# Patient Record
Sex: Male | Born: 1935 | Race: Black or African American | Hispanic: No | Marital: Married | Smoking: Former smoker
Health system: Southern US, Community
[De-identification: ages and names within clinical notes are randomized; demographics above are authoritative.]

## PROBLEM LIST (undated history)

## (undated) DIAGNOSIS — G250 Essential tremor: Secondary | ICD-10-CM

## (undated) DIAGNOSIS — K279 Peptic ulcer, site unspecified, unspecified as acute or chronic, without hemorrhage or perforation: Secondary | ICD-10-CM

## (undated) DIAGNOSIS — K269 Duodenal ulcer, unspecified as acute or chronic, without hemorrhage or perforation: Secondary | ICD-10-CM

## (undated) DIAGNOSIS — E1142 Type 2 diabetes mellitus with diabetic polyneuropathy: Secondary | ICD-10-CM

## (undated) DIAGNOSIS — Z860100 Personal history of colon polyps, unspecified: Secondary | ICD-10-CM

## (undated) DIAGNOSIS — G252 Other specified forms of tremor: Secondary | ICD-10-CM

## (undated) DIAGNOSIS — R202 Paresthesia of skin: Secondary | ICD-10-CM

## (undated) DIAGNOSIS — E785 Hyperlipidemia, unspecified: Secondary | ICD-10-CM

## (undated) DIAGNOSIS — K589 Irritable bowel syndrome without diarrhea: Secondary | ICD-10-CM

## (undated) DIAGNOSIS — K648 Other hemorrhoids: Secondary | ICD-10-CM

## (undated) DIAGNOSIS — I10 Essential (primary) hypertension: Secondary | ICD-10-CM

## (undated) DIAGNOSIS — G5600 Carpal tunnel syndrome, unspecified upper limb: Secondary | ICD-10-CM

## (undated) DIAGNOSIS — M47812 Spondylosis without myelopathy or radiculopathy, cervical region: Secondary | ICD-10-CM

## (undated) DIAGNOSIS — R200 Anesthesia of skin: Secondary | ICD-10-CM

## (undated) DIAGNOSIS — R413 Other amnesia: Secondary | ICD-10-CM

## (undated) HISTORY — PX: COLONOSCOPY W/ BIOPSIES AND POLYPECTOMY: SHX1376

## (undated) HISTORY — PX: ANAL FISSURE REPAIR: SHX2312

## (undated) HISTORY — DX: Essential tremor: G25.0

## (undated) HISTORY — PX: UPPER GASTROINTESTINAL ENDOSCOPY: SHX188

## (undated) HISTORY — DX: Carpal tunnel syndrome, unspecified upper limb: G56.00

## (undated) HISTORY — DX: Spondylosis without myelopathy or radiculopathy, cervical region: M47.812

## (undated) HISTORY — DX: Other hemorrhoids: K64.8

## (undated) HISTORY — DX: Peptic ulcer, site unspecified, unspecified as acute or chronic, without hemorrhage or perforation: K27.9

## (undated) HISTORY — DX: Other amnesia: R41.3

## (undated) HISTORY — DX: Type 2 diabetes mellitus with diabetic polyneuropathy: E11.42

## (undated) HISTORY — DX: Other specified forms of tremor: G25.2

## (undated) HISTORY — DX: Hyperlipidemia, unspecified: E78.5

## (undated) HISTORY — DX: Essential (primary) hypertension: I10

## (undated) HISTORY — DX: Duodenal ulcer, unspecified as acute or chronic, without hemorrhage or perforation: K26.9

## (undated) HISTORY — DX: Personal history of colon polyps, unspecified: Z86.0100

## (undated) HISTORY — PX: INGUINAL HERNIA REPAIR: SHX194

## (undated) HISTORY — PX: ANKLE SURGERY: SHX546

## (undated) HISTORY — DX: Irritable bowel syndrome, unspecified: K58.9

## (undated) HISTORY — DX: Personal history of colonic polyps: Z86.010

---

## 2001-04-17 ENCOUNTER — Encounter: Admission: RE | Admit: 2001-04-17 | Discharge: 2001-04-17 | Payer: Self-pay | Admitting: *Deleted

## 2001-04-17 ENCOUNTER — Encounter: Payer: Self-pay | Admitting: Allergy and Immunology

## 2002-04-30 ENCOUNTER — Encounter: Payer: Self-pay | Admitting: Internal Medicine

## 2003-08-15 ENCOUNTER — Encounter: Admission: RE | Admit: 2003-08-15 | Discharge: 2003-08-15 | Payer: Self-pay | Admitting: Allergy and Immunology

## 2003-08-26 ENCOUNTER — Ambulatory Visit (HOSPITAL_COMMUNITY): Admission: RE | Admit: 2003-08-26 | Discharge: 2003-08-26 | Payer: Self-pay | Admitting: Allergy and Immunology

## 2004-02-06 ENCOUNTER — Encounter: Payer: Self-pay | Admitting: Internal Medicine

## 2004-02-16 ENCOUNTER — Encounter: Payer: Self-pay | Admitting: Internal Medicine

## 2004-03-05 ENCOUNTER — Encounter: Payer: Self-pay | Admitting: Internal Medicine

## 2004-04-19 ENCOUNTER — Ambulatory Visit (HOSPITAL_COMMUNITY): Admission: AD | Admit: 2004-04-19 | Discharge: 2004-04-19 | Payer: Self-pay | Admitting: Neurosurgery

## 2004-04-29 ENCOUNTER — Ambulatory Visit (HOSPITAL_COMMUNITY): Admission: RE | Admit: 2004-04-29 | Discharge: 2004-04-29 | Payer: Self-pay | Admitting: Neurosurgery

## 2006-06-14 ENCOUNTER — Encounter: Payer: Self-pay | Admitting: Internal Medicine

## 2008-03-31 ENCOUNTER — Ambulatory Visit: Payer: Self-pay | Admitting: Internal Medicine

## 2008-03-31 DIAGNOSIS — R634 Abnormal weight loss: Secondary | ICD-10-CM | POA: Insufficient documentation

## 2008-03-31 DIAGNOSIS — Z86010 Personal history of colonic polyps: Secondary | ICD-10-CM

## 2008-03-31 DIAGNOSIS — R109 Unspecified abdominal pain: Secondary | ICD-10-CM | POA: Insufficient documentation

## 2008-04-08 ENCOUNTER — Telehealth: Payer: Self-pay | Admitting: Internal Medicine

## 2008-04-10 ENCOUNTER — Encounter: Payer: Self-pay | Admitting: Internal Medicine

## 2008-04-10 ENCOUNTER — Ambulatory Visit: Payer: Self-pay | Admitting: Internal Medicine

## 2008-04-17 ENCOUNTER — Telehealth (INDEPENDENT_AMBULATORY_CARE_PROVIDER_SITE_OTHER): Payer: Self-pay

## 2008-12-02 ENCOUNTER — Emergency Department: Payer: Self-pay | Admitting: Emergency Medicine

## 2009-04-30 ENCOUNTER — Encounter: Payer: Self-pay | Admitting: Internal Medicine

## 2009-05-07 ENCOUNTER — Encounter: Payer: Self-pay | Admitting: Internal Medicine

## 2009-05-14 ENCOUNTER — Encounter: Payer: Self-pay | Admitting: Internal Medicine

## 2009-05-22 ENCOUNTER — Encounter: Payer: Self-pay | Admitting: Internal Medicine

## 2009-06-03 ENCOUNTER — Encounter: Payer: Self-pay | Admitting: Internal Medicine

## 2009-06-05 ENCOUNTER — Ambulatory Visit: Payer: Self-pay | Admitting: Internal Medicine

## 2009-06-11 ENCOUNTER — Ambulatory Visit: Payer: Self-pay | Admitting: Internal Medicine

## 2009-06-30 ENCOUNTER — Telehealth: Payer: Self-pay | Admitting: Internal Medicine

## 2009-06-30 ENCOUNTER — Ambulatory Visit: Payer: Self-pay | Admitting: Internal Medicine

## 2009-07-01 ENCOUNTER — Encounter: Payer: Self-pay | Admitting: Internal Medicine

## 2010-02-15 ENCOUNTER — Encounter: Payer: Self-pay | Admitting: Internal Medicine

## 2010-02-15 ENCOUNTER — Telehealth: Payer: Self-pay | Admitting: Internal Medicine

## 2010-03-05 ENCOUNTER — Ambulatory Visit: Payer: Self-pay | Admitting: Internal Medicine

## 2010-05-19 ENCOUNTER — Ambulatory Visit: Payer: Self-pay | Admitting: Internal Medicine

## 2010-10-17 ENCOUNTER — Encounter: Payer: Self-pay | Admitting: Neurosurgery

## 2010-10-26 NOTE — Assessment & Plan Note (Signed)
Summary: follow up diarrhea/sheri   History of Present Illness Visit Type: Follow-up Visit Primary GI MD: Stan Head MD [facility] Primary Provider: Adrian Prince, MD Requesting Provider: na Chief Complaint: Follow up Diarrhea. History of Present Illness:   Patient is here for follow up from his diarrhea states that he has some belching now but the diarrhea is better.  Two bowel movements a day. Usually formed. Has difficulty sorting out gas from stool. Still using Gas-Ex and Imodium. Has been able to travel.    GI Review of Systems    Reports belching.      Denies abdominal pain, acid reflux, bloating, chest pain, dysphagia with liquids, dysphagia with solids, heartburn, loss of appetite, nausea, vomiting, vomiting blood, weight loss, and  weight gain.        Denies anal fissure, black tarry stools, change in bowel habit, constipation, diarrhea, diverticulosis, fecal incontinence, heme positive stool, hemorrhoids, irritable bowel syndrome, jaundice, light color stool, liver problems, rectal bleeding, and  rectal pain.    Current Medications (verified): 1)  Pravachol 20 Mg  Tabs (Pravastatin Sodium) .... One Tablet By Mouth Once Daily 2)  Nasonex 50 Mcg/act  Susp (Mometasone Furoate) .... One Spray in Each Nostril Once Daily 3)  Aspirin 81 Mg Tbec (Aspirin) .... Take One By Mouth Once Daily 4)  Diovan 160 Mg Tabs (Valsartan) .... Take 1/2 Tablet By Mouth Once Daily 5)  Lactaid 3000 Unit Tabs (Lactase) .... Take One Tablet With Each Meal 6)  Imodium A-D 2 Mg Tabs (Loperamide Hcl) .Marland Kitchen.. 1-2/day As Needed 7)  Gas-X Extra Strength 125 Mg Caps (Simethicone) .Marland Kitchen.. 1 By Mouth As Needed 8)  Creon 12000 Unit Cpep (Pancrelipase (Lip-Prot-Amyl)) .... Take 1 Capsule By Mouth With Meals  Allergies (verified): 1)  ! Accupril  Past History:  Past Medical History: Reviewed history from 06/05/2009 and no changes required. Asthma/COPD Diabetes Type II Hypertension Hyperlipidemia Carpal Tunnel  Syndrome Cervical Spine DJD/neuropathy Colon polyps, ? type ?2005 Vitamin D deficiency Essential tremor Ankle Fracture  Past Surgical History: Anal Fissure surgery  Family History: Reviewed history from 03/31/2008 and no changes required. No FH of Colon Cancer:   Social History: Reviewed history from 03/05/2010 and no changes required. Occupation: Retired Patient is a former smoker. 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   Vital Signs:  Patient profile:   77 year old male Height:      66 inches Weight:      157.4 pounds BMI:     25.50 Pulse rate:   76 / minute Pulse rhythm:   regular BP sitting:   120 / 64  (left arm) Cuff size:   regular  Vitals Entered By: Harlow Mares CMA Duncan Dull) (May 19, 2010 1:23 PM)  Physical Exam  General:  elderly NAD   Impression & Recommendations:  Problem # 1:  IRRITABLE BOWEL SYNDROME (ICD-564.1) Assessment Improved He has had a chronic intermittent problem with this going back many years. At least to 2005 with unrevealing colonoscopy and EGD with respect to cause. it sounds like IBS.  he is better on Creon 12000 with meals but still has gas and stools that are indistinguishable. will try Creon 240000 with meals. He will call back with results. Keep antibiotic treatment for possible small bowel bacterial overgrowth in mind pending this response.  important to note that [place] of life is much better.  Problem # 2:  WEIGHT LOSS (YNW-295.62) Assessment: Deteriorated Down 4-5# since June. Feels well, bloodwork all ok at Dr. Rinaldo Cloud per patient. Will monitor and reassess after he clls back.  Patient Instructions: 1)  Please pick up your medications at your pharmacy. 2)  Your Creon 12000 was changed  to Creon 24000, take 1 with  meals.  3)  You may take 2 Creon 12000 before starting the Creon 24000. 4)  Please call us back in mid September with an update as to whther this has helped enough. also weigh yourself weekly and let us know those weights when you call. 5)  Copy sent to : Adrian Prince, MD 6)  The medication list was reviewed and reconciled.  All changed / newly prescribed medications were explained.  A complete medication list was provided to the patient / caregiver. Prescriptions: CREON 24000 UNIT CPEP (PANCRELIPASE (LIP-PROT-AMYL)) 1 by mouth with meals  #90 x 5   Entered and Authorized by:   Iva Boop MD, [facility]   Signed by:   Iva Boop MD, FACG on 05/19/2010   Method used:   Electronically to        [facility] (retail)       [address]       [place], [place]  [address]       Ph: [phone]       Fax: [phone]   RxID:   [id number]

## 2010-10-26 NOTE — Assessment & Plan Note (Signed)
Summary: diarrhea/Aaron Hendrix   History of Present Illness Visit Type: Follow-up Visit Primary GI MD: Stan Head MD [facility] Primary Provider: Adrian Prince, MD Requesting Provider: Corlis Leak Chief Complaint: diarrhea on and off 3 BMs per day History of Present Illness:   Chronic intermittent diarrhea problems have persisted. Imodium has been used with limited benefit. serum  seotonin and TSH recently drawn at PCP denies flushing In AM will have a firm stool then later it loosens up and then will stop with defecation after mid afternoon. Can be urgent and with incontinence. He has to carry extra clothes when he travels. Wife provides some hx.   GI Review of Systems    Reports abdominal pain.     Location of  Abdominal pain: upper abdomen.    Denies acid reflux, belching, bloating, chest pain, dysphagia with liquids, dysphagia with solids, heartburn, loss of appetite, nausea, vomiting, vomiting blood, weight loss, and  weight gain.      Reports change in bowel habits, diarrhea, and  fecal incontinence.     Denies anal fissure, black tarry stools, constipation, diverticulosis, heme positive stool, hemorrhoids, irritable bowel syndrome, jaundice, light color stool, liver problems, rectal bleeding, and  rectal pain.   EGD  Procedure date:  06/11/2009  Findings:      1) Severe gastritis in the total stomach, biopsied GASTRITIS - H. pylori 2) Duodenitis in the bulb/descending duodenum, biopsied DUODENITIS  3) Otherwise normal examination   Colonoscopy  Procedure date:  04/10/2008  Findings:      1) FIVE POLYPS REMOVED, ALL SMALL AND BENIGN-APPEARING 2) INTERNAL HEMORRHOIDS 3) OTHERWISE NORMAL COLONOSCOPY AND TERMINAL ILEUM EXAM. RANDOM COLON BIOPSIES TAKEN. 4) GOOD PREP    4.  COLON, CECAL AND DESCENDING POLYPS:  TUBULAR ADENOMAS.  NO HIGH GRADE DYSPLASIA OR MALIGNANCY IDENTIFIED.   5.  COLON, RANDOM BIOPSIES:  BENIGN COLONIC MUCOSA.  NO ACTIVE INFLAMMATION, MICROSCOPIC  COLITIS OR GRANULOMAS.   Comments:      Repeat colonoscopy in 3 years.     EGD  Procedure date:  04/10/2008  Findings:      1) 5 MM DUODENAL NODULE 2) GASTRIC MUCOSAL CHANGES CONSISTENT WITH GASTRITIS 3) OTHERWISE NORMAL EGD   1.  DUODENUM:  BENIGN SMALL BOWEL MUCOSA.  NO ACTIVE INFLAMMATION OR VILLOUS ATROPHY IDENTIFIED.   2.  STOMACH, ANTRUM:   MINIMAL CHRONIC GASTRITIS.  NO HELICOBACTER PYLORI, DYSPLASIA OR EVIDENCE OF MALIGNANCY IDENTIFIED.   3.  STOMACH, BODY AND FUNDUS:  MINIMAL CHRONIC GASTRITIS.  NO HELICOBACTER PYLORI, DYSPLASIA OR EVIDENCE OF MALIGNANCY IDENTIFIED.  Procedures Next Due Date:    Colonoscopy: 04/2011   Current Medications (verified): 1)  Pravachol 20 Mg  Tabs (Pravastatin Sodium) .... One Tablet By Mouth Once Daily 2)  Nasonex 50 Mcg/act  Susp (Mometasone Furoate) .... One Spray in Each Nostril Once Daily 3)  Aspirin 81 Mg Tbec (Aspirin) .... Take One By Mouth Once Daily 4)  Vitamin D [address] Unit  Caps (Ergocalciferol) .... One Tablet By Mouth Per Week 5)  Flora-Q   Caps (Misc Intestinal Flora Regulat) .... One Tablet By Mouth Once Daily 6)  Diovan 160 Mg Tabs (Valsartan) .... Take 1/2 Tablet By Mouth Once Daily 7)  Lactaid 3000 Unit Tabs (Lactase) .... Take One Tablet With Each Meal 8)  Imodium A-D 2 Mg Tabs (Loperamide Hcl) .Marland Kitchen.. 1-2/day 9)  Gas-X Extra Strength 125 Mg Caps (Simethicone) .Marland Kitchen.. 1 By Mouth As Needed  Allergies (verified): 1)  ! Accupril  Past History:  Past Medical History:  Reviewed history from 06/05/2009 and no changes required. Asthma/COPD Diabetes Type II Hypertension Hyperlipidemia Carpal Tunnel Syndrome Cervical Spine DJD/neuropathy Colon polyps, ? type ?2005 Vitamin D deficiency Essential tremor Ankle Fracture  Past Surgical History: Reviewed history from 03/31/2008 and no changes required. Anal Fissure  Family History: Reviewed history from 03/31/2008 and no changes required. No FH of Colon  Cancer:  Social History: Reviewed history from 06/05/2009 and no changes required. Occupation: Retired Patient is a former smoker. 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   Vital Signs:  Patient profile:   77 year old male Height:      66 inches Weight:      162 pounds BMI:     26.24 Pulse rate:   80 / minute Pulse rhythm:   regular BP sitting:   148 / 70  (left arm)  Vitals Entered By: Milford Cage NCMA (March 05, 2010 9:40 AM)  Physical Exam  General:  elderly NAD Eyes:  anicteric Lungs:  diffusely decreased breath sounds Heart:  Regular rate and rhythm; no murmurs, rubs,  or bruits. Abdomen:  soft and non-tender, no HSM or masses BS+ Neurologic:  a&o x 3   Impression & Recommendations:  Problem # 1:  IRRITABLE BOWEL SYNDROME (ICD-564.1) Assessment Unchanged He has had a chronic intermittent problem with this going back many years. At least to 2005 with unrevealing colonoscopy and EGD with respect to cause. it sounds like IBS. At times he has told me that Imodium was working. Will try Creon 12 with meals and if helpful would continue. If not then consider a round of  antibiotics for possible small bowel bacterial overgrowth they are going to the to beach in early July and there are major quality of  life issues with the diarrhea and urge incontinence hopefully will have some improvement by then.  Problem # 2:  WEIGHT LOSS (ICD-783.21) Assessment: Improved stable  Patient Instructions: 1)  Please take Creon as directed below. 2)  We will call you in 1 week to follow up. 3)  Copy sent to : Adrian Prince, MD 4)  The medication list was reviewed and reconciled.  All changed / newly prescribed medications were explained.  A complete medication list was provided to  the patient / caregiver.  Appended Document: diarrhea/Aaron Hendrix I spoke with the patient this am.  He says he is doing good on Creon.  Diarrhea has improved   Appended Document: diarrhea/Aaron Hendrix Make sure he has an Rx for this as written #90 with refills for 6 mos total, he may want mail order? If Creon 120000 not formulary then use Zen-Pep aith closest lipase value to 12,000 i would like him to arrange an REV in 2 mos  Appended Document: diarrhea/Aaron Hendrix REV scheduled with patient for 05/19/10 1:45.  Patient  wants rx to go to local rx   Clinical Lists Changes  Medications: Rx of CREON 12000 UNIT CPEP (PANCRELIPASE (LIP-PROT-AMYL)) take 1 capsule by mouth with meals;  #90 x 6;  Signed;  Entered by: Darcey Nora RN, CGRN;  Authorized by: Iva Boop MD, Clementeen Graham;  Method used: Electronically to [facility] [address] [place], [place], [place]  [address], Ph: [phone], Fax: [phone]    Prescriptions: CREON 12000 UNIT CPEP (PANCRELIPASE (LIP-PROT-AMYL)) take 1 capsule by  mouth with meals  #90 x 6   Entered by:   Darcey Nora RN, CGRN   Authorized by:   Iva Boop MD, [facility]   Signed by:   Darcey Nora RN, CGRN on 03/16/2010   Method used:   Electronically to        [facility] (retail)       [address]       [place], [place]  [address]       Ph: [phone]       Fax: [phone]   RxID:   [id number]

## 2010-10-26 NOTE — Letter (Signed)
Summary: [facility]  [facility]   Imported By: Sherian Rein 03/10/2010 13:50:40  _____________________________________________________________________  External Attachment:    Type:   Image     Comment:   External Document

## 2010-10-26 NOTE — Progress Notes (Signed)
Summary: sooner appt  Phone Note From Other Clinic Call back at [phone]   Caller: referral Coordinator Malachi Bonds Call For: Leone Payor Reason for Call: Schedule Patient Appt Summary of Call: Dr Adrian Prince would like this patient seen before first available 7-7 for continued diarrhea, states that it's not an emergency but the patient is scheduled to leave for vacation that week. Initial call taken by: Tawni Levy,  Feb 15, 2010 11:09 AM  Follow-up for Phone Call        patient scheduled for 03/05/10 9:30 Darcey Nora RN, [facility]  Feb 15, 2010 11:22 AM Follow-up by: Darcey Nora RN, CGRN,  Feb 15, 2010 11:22 AM

## 2010-12-31 LAB — GLUCOSE, CAPILLARY
Glucose-Capillary: 87 mg/dL (ref 70–99)
Glucose-Capillary: 89 mg/dL (ref 70–99)

## 2011-05-16 ENCOUNTER — Encounter: Payer: Self-pay | Admitting: Internal Medicine

## 2011-05-24 ENCOUNTER — Ambulatory Visit (INDEPENDENT_AMBULATORY_CARE_PROVIDER_SITE_OTHER): Payer: Medicare Other | Admitting: Internal Medicine

## 2011-05-24 ENCOUNTER — Encounter: Payer: Self-pay | Admitting: Internal Medicine

## 2011-05-24 VITALS — BP 132/64 | HR 60 | Ht 65.0 in | Wt 162.0 lb

## 2011-05-24 DIAGNOSIS — K589 Irritable bowel syndrome without diarrhea: Secondary | ICD-10-CM

## 2011-05-24 DIAGNOSIS — I10 Essential (primary) hypertension: Secondary | ICD-10-CM

## 2011-05-24 DIAGNOSIS — E119 Type 2 diabetes mellitus without complications: Secondary | ICD-10-CM

## 2011-05-24 MED ORDER — LOPERAMIDE HCL 2 MG PO TABS: ORAL_TABLET | ORAL | Status: DC

## 2011-05-24 MED ORDER — PANCRELIPASE (LIP-PROT-AMYL) 24000-76000 UNITS PO CPEP: 1.0000 | ORAL_CAPSULE | Freq: Three times a day (TID) | ORAL | Status: DC

## 2011-05-24 NOTE — Assessment & Plan Note (Signed)
He stopped taking his pancreatic enzyme supplement with every meal was taking it about once a day. When I saw him a year ago he was doing as well as he ever had using this 3 times a day with meals. I explained the need to take it with every meal and want him to this and call me back in a week with an update. If that's not working then will think about other possible therapies

## 2011-05-24 NOTE — Progress Notes (Signed)
  Subjective:    [name], male    DOB: 11/04/1929, 77 y.o.   MRN: [id number]  HPI he has chronic diarrhea that is thought to be irritable bowel syndrome. He had improved on Creon supplementation. He was seen a year ago and things were good but that only lasted for a couple months or so. He is describing 5-6 bowel movements a day between the morning and mid afternoon and then things seemed to taper off. He is unable to get far from the bathroom because of that. There is not much room to have a stool, and he has had incontinence. His weight has been stable over time. He says if he takes a half Imodium he'll tend to get constipated, he is using a half an Imodium tablet 3-4 times a week with some help but he is still good enough about leaving the house much with that and is concerned that if he takes that every day he might end up constipated. He has a pending trip to [place], possibly for a wedding of a relative but is very concerned about being able to travel with these problems.    Review of Systems As above    Objective:   Physical Exam Elderly BM NAD       Assessment & Plan:

## 2011-05-24 NOTE — Patient Instructions (Addendum)
Take Creon three times a day with a meal. Call us back with an update and symptoms in 1 week.

## 2011-05-25 ENCOUNTER — Encounter: Payer: Self-pay | Admitting: Internal Medicine

## 2011-05-25 DIAGNOSIS — E119 Type 2 diabetes mellitus without complications: Secondary | ICD-10-CM | POA: Insufficient documentation

## 2011-05-25 DIAGNOSIS — I10 Essential (primary) hypertension: Secondary | ICD-10-CM | POA: Insufficient documentation

## 2011-05-31 ENCOUNTER — Telehealth: Payer: Self-pay | Admitting: Internal Medicine

## 2011-05-31 NOTE — Telephone Encounter (Signed)
Aaron Hendrix has relieved all symptoms.  He is asked to continue.

## 2012-04-25 ENCOUNTER — Inpatient Hospital Stay: Payer: Self-pay | Admitting: Surgery

## 2012-04-25 LAB — COMPREHENSIVE METABOLIC PANEL
Alkaline Phosphatase: 73 U/L (ref 50–136)
Anion Gap: 11 (ref 7–16)
BUN: 8 mg/dL (ref 7–18)
Calcium, Total: 8.9 mg/dL (ref 8.5–10.1)
Chloride: 98 mmol/L (ref 98–107)
Co2: 27 mmol/L (ref 21–32)
Creatinine: 0.86 mg/dL (ref 0.60–1.30)
EGFR (African American): >60
EGFR (Non-African Amer.): >60
Potassium: 4.4 mmol/L (ref 3.5–5.1)
SGOT(AST): 27 U/L (ref 15–37)
SGPT (ALT): 12 U/L
Total Protein: 6.3 g/dL — ABNORMAL LOW (ref 6.4–8.2)

## 2012-04-25 LAB — CBC
HCT: 38.9 % — ABNORMAL LOW (ref 40.0–52.0)
HGB: 12.8 g/dL — ABNORMAL LOW (ref 13.0–18.0)
MCHC: 33 g/dL (ref 32.0–36.0)
MCV: 103 fL — ABNORMAL HIGH (ref 80–100)
RDW: 12.3 % (ref 11.5–14.5)
WBC: 11.1 10*3/uL — ABNORMAL HIGH (ref 3.8–10.6)

## 2012-04-25 LAB — URINALYSIS, COMPLETE
Bilirubin,UR: NEGATIVE
Blood: NEGATIVE
Leukocyte Esterase: NEGATIVE
Ph: 5 (ref 4.5–8.0)
Protein: NEGATIVE
Squamous Epithelial: NONE SEEN

## 2012-04-25 LAB — PROTIME-INR: INR: 0.9

## 2012-04-25 LAB — CK TOTAL AND CKMB (NOT AT ARMC): CK-MB: 1.9 ng/mL (ref 0.5–3.6)

## 2012-04-25 LAB — MAGNESIUM: Magnesium: 1.3 mg/dL — ABNORMAL LOW

## 2012-04-25 LAB — APTT: Activated PTT: 26.7 secs (ref 23.6–35.9)

## 2012-04-26 LAB — OCCULT BLOOD X 1 CARD TO LAB, STOOL: Occult Blood, Feces: POSITIVE

## 2012-04-27 LAB — CLOSTRIDIUM DIFFICILE BY PCR

## 2012-04-28 LAB — CBC WITH DIFFERENTIAL/PLATELET
Eosinophil #: 0.6 10*3/uL (ref 0.0–0.7)
HCT: 38.4 % — ABNORMAL LOW (ref 40.0–52.0)
MCH: 34.7 pg — ABNORMAL HIGH (ref 26.0–34.0)
MCHC: 33.2 g/dL (ref 32.0–36.0)
MCV: 104 fL — ABNORMAL HIGH (ref 80–100)
Monocyte #: 1.3 x10 3/mm — ABNORMAL HIGH (ref 0.2–1.0)
Neutrophil #: 6.3 10*3/uL (ref 1.4–6.5)
Platelet: 335 10*3/uL (ref 150–440)
RBC: 3.68 10*6/uL — ABNORMAL LOW (ref 4.40–5.90)
RDW: 12.6 % (ref 11.5–14.5)

## 2012-06-18 ENCOUNTER — Encounter: Payer: Self-pay | Admitting: Internal Medicine

## 2012-07-04 ENCOUNTER — Ambulatory Visit: Payer: Self-pay | Admitting: Surgery

## 2012-08-09 ENCOUNTER — Encounter: Payer: Self-pay | Admitting: *Deleted

## 2012-08-09 ENCOUNTER — Telehealth: Payer: Self-pay | Admitting: Internal Medicine

## 2012-08-09 NOTE — Telephone Encounter (Signed)
Pt having abdominal pain. Requests pt be seen sooner than 1st available. Pt scheduled to see Willette Cluster NP tomorrow at 1:30pm. Malachi Bonds faxed records and will notify pt of appt date and time.

## 2012-08-10 ENCOUNTER — Ambulatory Visit (INDEPENDENT_AMBULATORY_CARE_PROVIDER_SITE_OTHER): Payer: Medicare Other | Admitting: Nurse Practitioner

## 2012-08-10 ENCOUNTER — Encounter: Payer: Self-pay | Admitting: Nurse Practitioner

## 2012-08-10 VITALS — BP 138/60 | HR 78 | Ht 65.0 in | Wt 149.4 lb

## 2012-08-10 DIAGNOSIS — K269 Duodenal ulcer, unspecified as acute or chronic, without hemorrhage or perforation: Secondary | ICD-10-CM | POA: Insufficient documentation

## 2012-08-10 DIAGNOSIS — R1010 Upper abdominal pain, unspecified: Secondary | ICD-10-CM

## 2012-08-10 DIAGNOSIS — R109 Unspecified abdominal pain: Secondary | ICD-10-CM

## 2012-08-10 DIAGNOSIS — R110 Nausea: Secondary | ICD-10-CM

## 2012-08-10 HISTORY — DX: Duodenal ulcer, unspecified as acute or chronic, without hemorrhage or perforation: K26.9

## 2012-08-10 MED ORDER — PROMETHAZINE HCL 12.5 MG PO TABS: 12.5000 mg | ORAL_TABLET | Freq: Four times a day (QID) | ORAL | Status: DC | PRN

## 2012-08-10 NOTE — Progress Notes (Signed)
08/10/2012 Aaron Hendrix [id number] 04/21/1930   History of Present Illness:  Patient is a 78 year old male known to Dr. Leone Payor for history of diarrhea predominant irritable bowel syndrome. He was last seen August 2012. Patient is referred here for evaluation of an abnormal CT scan showing severe duodenitis.  Patient was hospitalized at [facility] late July with a penetrating ulcer involving the distal antrum and duodenum. Patient had been taking Ibuprofen. I reviewed several pages of records faxed from [place]. Patient was seen by surgeon  Dr. Renda Rolls. He was treated inpatient with IVF and IV PPI. Post hospital he followed up with surgeon he did an outpatient endoscopy 07/04/12 to make sure ulcer had healed. EGD revealed minimal duodenal deformity in the region of the bulb but no ulcer. Extent of the examination was to the third portion of the duodenum. Gastritis was found, gastric biopsies compatible with superficial vascular congestion, and H. Pylori. Patient was treated with, and completed course of Biaxin and amoxicillin. No NSAID use.   Patient saw PCP two days ago with a one week history of nausea, upper abdominal pain and anorexia. He had black stools yesterday but has been taking bismuth. The pain is slightly lower than when hospitalized with ulcer. CBC yesterday pertinent for white count of 14.8. CT scan of the abdomen and pelvis without contrast yesterday shows market wall thickening with luminal constriction of the mid duodenum . Findings associated with obstructive changes of the stomach and proximal duodenum. Other findings included focal dilation of the right distal ureter without proximal obstruction and a distended fluid-filled cecum of uncertain etiology.  Current Medications, Allergies, Past Medical History, Past Surgical History, Family History and Social History were reviewed in [employer] record.   Physical Exam: General: Well developed ,  white male in no acute distress Head: Normocephalic and atraumatic Eyes:  sclerae anicteric, conjunctiva pink  Ears: Normal auditory acuity Lungs: Clear throughout to auscultation Heart: Regular rate and rhythm Abdomen: Soft, non distended, very mild RUQ tenderness. . No masses, no hepatomegaly. Normal bowel sounds Rectal: light brown, heme negative stool Musculoskeletal: Symmetrical with no gross deformities  Extremities: No edema  Neurological: Alert oriented x 4, grossly nonfocal Psychological:  Alert and cooperative. Normal mood and affect  Assessment and Recommendations:  One week history of nausea, vomiting, and upper abdominal pain in setting of marked wall thickening of mid duodenum with partial obstruction of lumen seen on CTscan.. Interestingly, patient was hospitalized in late July with a penetrating ulcer involving antrum / pylorus. EGD in October by Surgery noted ulcer had healed and remainder of duodenal (to third portion) was normal. This current lesion is certainly distal to what he had in July, etiology is unclear. For further evaluation patient will need repeat EGD, or enteroscopy. The benefits, risks, and potential complications of EGD with possible biopsies were discussed with the patient and he agrees to proceed.

## 2012-08-10 NOTE — Patient Instructions (Addendum)
We sent prescription for Phenergan for nausea to [facility] in [place].  You have been scheduled for an endoscopy with propofol. Please follow written instructions given to you at your visit today. If you use inhalers (even only as needed) or a CPAP machine, please bring them with you on the day of your procedure.

## 2012-08-13 ENCOUNTER — Ambulatory Visit (AMBULATORY_SURGERY_CENTER): Payer: Medicare Other | Admitting: Internal Medicine

## 2012-08-13 ENCOUNTER — Other Ambulatory Visit: Payer: Medicare Other

## 2012-08-13 ENCOUNTER — Encounter: Payer: Self-pay | Admitting: Internal Medicine

## 2012-08-13 VITALS — BP 136/71 | HR 77 | Temp 98.2°F | Resp 22 | Ht 65.0 in | Wt 149.0 lb

## 2012-08-13 DIAGNOSIS — K269 Duodenal ulcer, unspecified as acute or chronic, without hemorrhage or perforation: Secondary | ICD-10-CM

## 2012-08-13 DIAGNOSIS — R110 Nausea: Secondary | ICD-10-CM

## 2012-08-13 DIAGNOSIS — R109 Unspecified abdominal pain: Secondary | ICD-10-CM

## 2012-08-13 DIAGNOSIS — R1010 Upper abdominal pain, unspecified: Secondary | ICD-10-CM

## 2012-08-13 DIAGNOSIS — K2980 Duodenitis without bleeding: Secondary | ICD-10-CM

## 2012-08-13 DIAGNOSIS — K2970 Gastritis, unspecified, without bleeding: Secondary | ICD-10-CM

## 2012-08-13 DIAGNOSIS — K2990 Gastroduodenitis, unspecified, without bleeding: Secondary | ICD-10-CM

## 2012-08-13 MED ORDER — SODIUM CHLORIDE 0.9 % IV SOLN: 500.0000 mL | INTRAVENOUS | Status: DC

## 2012-08-13 MED ORDER — DEXLANSOPRAZOLE 60 MG PO CPDR: DELAYED_RELEASE_CAPSULE | ORAL | Status: DC

## 2012-08-13 NOTE — Progress Notes (Signed)
1610 a/ox3/ pleased with MAC, report to [employer]

## 2012-08-13 NOTE — Progress Notes (Signed)
Patient did not experience any of the following events: a burn prior to discharge; a fall within the facility; wrong site/side/patient/procedure/implant event; or a hospital transfer or hospital admission upon discharge from the facility. (G8907) Patient did not have preoperative order for IV antibiotic SSI prophylaxis. (G8918)  

## 2012-08-13 NOTE — Patient Instructions (Addendum)
There is an ulcer in the duodenum. You also have inflammation in the stomach - gastritis (had before) and inflammation in the esophagus (esophagitis) which is probably from reflux.  I took multiple biopsies though I think this is probably all benign and not cancer.  I am providing samples of a drug called Dexilant to be taken every AM 30 minutes before breakfast. This should heal the ulcer. I also ordered a blood test today called a gastrin level which you will have today.  Avoid raw vegetables and high fiber (low-residue diet please). See handout.  I will call later this week with results and plans.  Thank you for choosing me and [facility].  Iva Boop, MD, [facility]  Resume current medications. See handouts on gastritis,& esophagitis. YOU HAD AN ENDOSCOPIC PROCEDURE TODAY AT THE [facility]: Refer to the procedure report that was given to you for any specific questions about what was found during the examination.  If the procedure report does not answer your questions, please call your gastroenterologist to clarify.  If you requested that your care partner not be given the details of your procedure findings, then the procedure report has been included in a sealed envelope for you to review at your convenience later.  YOU SHOULD EXPECT: Some feelings of bloating in the abdomen. Passage of more gas than usual.  Walking can help get rid of the air that was put into your GI tract during the procedure and reduce the bloating. If you had a lower endoscopy (such as a colonoscopy or flexible sigmoidoscopy) you may notice spotting of blood in your stool or on the toilet paper. If you underwent a bowel prep for your procedure, then you may not have a normal bowel movement for a few days.  DIET: Your first meal following the procedure should be a light meal and then it is ok to progress to your normal diet.  A half-sandwich or bowl of soup is an example of a good first meal.   Heavy or fried foods are harder to digest and may make you feel nauseous or bloated.  Likewise meals heavy in dairy and vegetables can cause extra gas to form and this can also increase the bloating.  Drink plenty of fluids but you should avoid alcoholic beverages for 24 hours.  ACTIVITY: Your care partner should take you home directly after the procedure.  You should plan to take it easy, moving slowly for the rest of the day.  You can resume normal activity the day after the procedure however you should NOT DRIVE or use heavy machinery for 24 hours (because of the sedation medicines used during the test).    SYMPTOMS TO REPORT IMMEDIATELY: A gastroenterologist can be reached at any hour.  During normal business hours, 8:30 AM to 5:00 PM Monday through Friday, call [phone].  After hours and on weekends, please call the GI answering service at [phone] who will take a message and have the physician on call contact you.   Following upper endoscopy (EGD)  Vomiting of blood or coffee ground material  New chest pain or pain under the shoulder blades  Painful or persistently difficult swallowing  New shortness of breath  Fever of 100F or higher  Black, tarry-looking stools  FOLLOW UP: If any biopsies were taken you will be contacted by phone or by letter within the next 1-3 weeks.  Call your gastroenterologist if you have not heard about the biopsies in 3 weeks.  Our staff will call the home number listed on your records the next business day following your procedure to check on you and address any questions or concerns that you may have at that time regarding the information given to you following your procedure. This is a courtesy call and so if there is no answer at the home number and we have not heard from you through the emergency physician on call, we will assume that you have returned to your regular daily activities without incident.  SIGNATURES/CONFIDENTIALITY: You and/or your  care partner have signed paperwork which will be entered into your electronic medical record.  These signatures attest to the fact that that the information above on your After Visit Summary has been reviewed and is understood.  Full responsibility of the confidentiality of this discharge information lies with you and/or your care-partner.

## 2012-08-13 NOTE — Op Note (Signed)
[facility] [address] [place], [address]   ENDOSCOPY PROCEDURE REPORT  PATIENT: Aaron Hendrix, Aaron Hendrix  MR#: [id number] BIRTHDATE: [birth year] , 81  yrs. old GENDER: Male ENDOSCOPIST: Iva Boop, MD, [facility] PROCEDURE DATE:  08/13/2012 PROCEDURE:  Enteroscopy and biopsy ASA CLASS:     Class III INDICATIONS:  weight loss. MEDICATIONS: Propofol (Diprivan) 130 mg IV, MAC sedation, administered by CRNA, and These medications were titrated to patient response per physician's verbal order TOPICAL ANESTHETIC: Cetacaine Spray  DESCRIPTION OF PROCEDURE: After the risks benefits and alternatives of the procedure were thoroughly explained, informed consent was obtained.  The LB [id number]-H180 [id number] endoscope was introduced through the mouth and advanced to the proximal jejunum. Without limitations.  The instrument was slowly withdrawn as the mucosa was fully examined.        DUODENUM: A large non-bleeding round, deep and clean-based ulcer with surrounding edema was found in the 2nd part of the duodenum. Biopsies were taken at edge of the ulcer and around the ulcer. Severe duodenal inflammation was found in the 2nd part of the duodenum.  STOMACH: Moderate chronic gastritis (inflammation) with hemorrhage was found in the gastric antrum and gastric body.  Multiple biopsies were performed using cold forceps.  Sample sent for histology.  JEJUNUM: The exam showed no abnormalities in the jejunum.  ESOPHAGUS: Severe esophagitis was found in the lower third of the esophagus.  Multiple biopsies were performed.  Sample sent for histology.  The remainder of the upper endoscopy exam was otherwise normal. Retroflexed views revealed no abnormalities.     The scope was then withdrawn from the patient and the procedure completed.  COMPLICATIONS: There were no complications. ENDOSCOPIC IMPRESSION: 1.   Large non-bleeding ulcer was found in the 2nd part of the duodenum 2.    Duodenal inflammation was found in the 2nd part of the duodenum  3.   Chronic gastritis (inflammation) with hemorrhage was found in the gastric antrum and gastric body; multiple biopsies 4.   Esophagitis in the lower third of the esophagus; multiple biopsies 5.   The remainder of the upper endoscopy exam was otherwise normal  RECOMMENDATIONS: PPI qam - Dexilant 60 mg Will call results and plans gastrin level today (? gastrinoma/ZE syndrome)    eSigned:  Iva Boop, MD, [facility] 08/13/2012 9:14 AM   XB:[id number] Evlyn Kanner, MD and The Patient  and Renda Rolls, MD  PATIENT NAME:  [name], [name] MR#: [id number]

## 2012-08-14 ENCOUNTER — Telehealth: Payer: Self-pay | Admitting: *Deleted

## 2012-08-14 LAB — GASTRIN, SERUM: Gastrin: 105 pg/mL (ref 0–115)

## 2012-08-14 NOTE — Telephone Encounter (Signed)
  Follow up Call-  Call back number 08/13/2012  Post procedure Call Back phone  # [phone]  Permission to leave phone message Yes     Patient questions:Message left for patient to call us if necessary.

## 2012-08-15 NOTE — Progress Notes (Signed)
Agree with Ms. Guenther's assessment and plan. Markos Theil E. Danitza Schoenfeldt, MD, FACG   

## 2012-08-19 ENCOUNTER — Encounter: Payer: Self-pay | Admitting: Internal Medicine

## 2012-08-19 NOTE — Progress Notes (Signed)
Quick Note:  Call from office  Biopsies all ok and the gastrin level was not significantly elevated Stay on Dexilant until samples run out Then start pantoprazole 40 mg daily #30 11 refills (let me know if too costly) - send Rx now See me in office 4-6 weeks  LEC No letter or recall ______

## 2012-08-21 ENCOUNTER — Other Ambulatory Visit: Payer: Self-pay

## 2012-08-21 MED ORDER — PANTOPRAZOLE SODIUM 40 MG PO TBEC: 40.0000 mg | DELAYED_RELEASE_TABLET | Freq: Every day | ORAL | Status: DC

## 2012-08-22 DIAGNOSIS — K269 Duodenal ulcer, unspecified as acute or chronic, without hemorrhage or perforation: Secondary | ICD-10-CM | POA: Insufficient documentation

## 2012-09-27 ENCOUNTER — Encounter: Payer: Self-pay | Admitting: Internal Medicine

## 2012-09-27 ENCOUNTER — Ambulatory Visit (INDEPENDENT_AMBULATORY_CARE_PROVIDER_SITE_OTHER): Payer: Medicare Other | Admitting: Internal Medicine

## 2012-09-27 VITALS — BP 128/66 | HR 68 | Ht 65.0 in | Wt 139.0 lb

## 2012-09-27 DIAGNOSIS — R634 Abnormal weight loss: Secondary | ICD-10-CM

## 2012-09-27 DIAGNOSIS — K269 Duodenal ulcer, unspecified as acute or chronic, without hemorrhage or perforation: Secondary | ICD-10-CM

## 2012-09-27 MED ORDER — PANTOPRAZOLE SODIUM 40 MG PO TBEC: 40.0000 mg | DELAYED_RELEASE_TABLET | Freq: Every day | ORAL | Status: DC

## 2012-09-27 NOTE — Progress Notes (Signed)
Subjective:    [name], male    DOB: January 05, 1930, 79 y.o.   MRN: [id number]  HPI Patient presents in followup with his wife today. He had a duodenal ulcer with partial outlet obstruction diagnosed in November. He has been maintained on PPI therapy since then. He is improved with no abdominal pain, he is not having any vomiting. He is tolerating eating better but his appetite is off somewhat the last couple months he has anorexia. This is a chronic recurrent problem. His weight is down however. Wt Readings from Last 3 Encounters:  09/27/12 139 lb (63.05 kg)  08/13/12 149 lb (67.586 kg)  08/10/12 149 lb 6.4 oz (67.767 kg)   He is waiting on a possible right hip replacement he has pain he has not been exercising or is active and it sounds like he has some mild anhedonia. His wife reports his lab testing at primary care office recently was all fine. He has continued on Dexilant samples. He did not have H. pylori or other problems on biopsies. A gastrin level was checked and was okay. He has stopped drinking a year and Y., used of a couple of beers in a glass of wine every night.  Allergies  Allergen Reactions  . Latex   . Quinapril Hcl    Outpatient Prescriptions Prior to Visit  Medication Sig Dispense Refill  . aspirin 81 MG tablet Take 81 mg by mouth daily.        . Pancrelipase, Lip-Prot-Amyl, (CREON) 24000 UNITS CPEP Take 1 capsule (24,000 Units total) by mouth 3 (three) times daily with meals.  180 capsule  11  . pravastatin (PRAVACHOL) 20 MG tablet Take 20 mg by mouth daily.        . traMADol (ULTRAM) 50 MG tablet Take 50 mg by mouth at bedtime as needed.      . Vitamin D, Ergocalciferol, (DRISDOL) 50000 UNITS CAPS Take 50,000 Units by mouth every 7 (seven) days.      . [DISCONTINUED] dexlansoprazole (DEXILANT) 60 MG capsule 1 tablet every morning 30 minutes before breakfast  30 capsule  0  .       .       .       .       Marland Kitchen     11  .     0  .        Last  reviewed on 09/27/2012 11:42 AM by Iva Boop, MD Past Medical History  Diagnosis Date  . COPD with asthma   . Diabetes mellitus     Diet control   . Hypertension   . Hyperlipidemia   . Carpal tunnel syndrome   . DJD (degenerative joint disease), cervical   . Vitamin D deficiency   . Tremor, essential   . Internal hemorrhoids   . Personal history of colonic polyps     adenomatous  . IBS (irritable bowel syndrome)   . Peptic ulcer   . Duodenal ulcer, with partial obstruction 08/10/2012   Past Surgical History  Procedure Date  . Anal fissure repair   . Colonoscopy w/ biopsies and polypectomy 8/03, 6/05, 7/09, 9/10    internal hemorrhoids, tubular adenomas, mucosa & lymphoid nodules  . Upper gastrointestinal endoscopy 3/05, 7/09, 9/10,2013    gastritis, duodenitis    Review of Systems As per history of present illness    Objective:   Physical Exam Thin elderly black man in no acute distress No cervical or  supraclavicular lymphadenopathy Abdomen is soft and nontender without organomegaly or mass     Assessment & Plan:   1. Duodenal ulcer, with partial obstruction   2. Weight loss    He is improved overall. I want him to stay on chronic PPI. He has been 2 phases of weight fluctuation and anorexia before, he may have some seasonal affective problems, is chronic hip pain may be bothering him causing some depressive symptomatology as well. His wife will check his weight at least weekly and call me back at the end of the month to give me an update on the trend there. Overall I don't think anything serious is going on but I would like to see that his weight levels all 4 increases.  CC: Julian Hy, MD

## 2012-09-27 NOTE — Patient Instructions (Addendum)
Please weigh once a week and then call us back at the end of January with an update on his weights.  We have sent the following medications to your pharmacy for you to pick up at your convenience: Pantoprazole  Don't stop the pantoprazole unless a doctor tells you to.  The pantoprazole will replace your dexilant.  Thank you for choosing me and [facility].  Iva Boop, M.D., [facility]

## 2012-09-27 NOTE — Assessment & Plan Note (Signed)
Improved Start pantoprazole chronically See me as needed Wife to call back with weight update

## 2012-10-15 ENCOUNTER — Other Ambulatory Visit (HOSPITAL_COMMUNITY): Payer: Self-pay | Admitting: Orthopaedic Surgery

## 2012-10-23 ENCOUNTER — Emergency Department: Payer: Self-pay | Admitting: Unknown Physician Specialty

## 2012-10-23 LAB — COMPREHENSIVE METABOLIC PANEL
Alkaline Phosphatase: 76 U/L (ref 50–136)
Anion Gap: 5 — ABNORMAL LOW (ref 7–16)
BUN: 13 mg/dL (ref 7–18)
Bilirubin,Total: 0.5 mg/dL (ref 0.2–1.0)
Chloride: 100 mmol/L (ref 98–107)
Creatinine: 0.66 mg/dL (ref 0.60–1.30)
EGFR (Non-African Amer.): >60
Glucose: 82 mg/dL (ref 65–99)
Potassium: 5.5 mmol/L — ABNORMAL HIGH (ref 3.5–5.1)
SGOT(AST): 42 U/L — ABNORMAL HIGH (ref 15–37)
SGPT (ALT): 17 U/L (ref 12–78)
Sodium: 135 mmol/L — ABNORMAL LOW (ref 136–145)
Total Protein: 7.3 g/dL (ref 6.4–8.2)

## 2012-10-23 LAB — CBC
HCT: 42.6 % (ref 40.0–52.0)
HGB: 13.9 g/dL (ref 13.0–18.0)
MCH: 31.8 pg (ref 26.0–34.0)
MCHC: 32.7 g/dL (ref 32.0–36.0)
Platelet: 297 10*3/uL (ref 150–440)
RBC: 4.38 10*6/uL — ABNORMAL LOW (ref 4.40–5.90)
RDW: 13.9 % (ref 11.5–14.5)

## 2012-10-23 LAB — MAGNESIUM: Magnesium: 1.6 mg/dL — ABNORMAL LOW

## 2012-10-23 LAB — CK TOTAL AND CKMB (NOT AT ARMC): CK, Total: 168 U/L (ref 35–232)

## 2012-11-05 ENCOUNTER — Encounter (HOSPITAL_COMMUNITY): Payer: Self-pay | Admitting: Pharmacy Technician

## 2012-11-05 NOTE — Pre-Procedure Instructions (Signed)
Aaron Hendrix  11/05/2012   Your procedure is scheduled on:  Tuesday, February 18th.  Report to [facility] at 10:45 AM.  Call this number if you have problems the morning of surgery: [phone]   Remember:   Do not eat food or drink liquids after midnight.    Take these medicines the morning of surgery with A SIP OF WATER: Pantoprazole (Protonix).  Acetaminophen (Tylenol ) if needed.   Do not wear jewelry, make-up or nail polish.  Do not wear lotions, powders, or perfumes. You may wear deodorant.  Do not shave 48 hours prior to surgery. Men may shave face and neck.  Do not bring valuables to the hospital.  Contacts, dentures or bridgework may not be worn into surgery.  Leave suitcase in the car. After surgery it may be brought to your room.  For patients admitted to the hospital, checkout time is 11:00 AM the day of  discharge.     Special Instructions: Shower using CHG 2 nights before surgery and the night before surgery.  If you shower the day of surgery use CHG.  Use special wash - you have one bottle of CHG for all showers.  You should use approximately 1/3 of the bottle for each shower.   Please read over the following fact sheets that you were given: Pain Booklet, Coughing and Deep Breathing, Blood Transfusion Information and Surgical Site Infection Prevention

## 2012-11-06 ENCOUNTER — Encounter (HOSPITAL_COMMUNITY): Payer: Self-pay

## 2012-11-06 ENCOUNTER — Encounter (HOSPITAL_COMMUNITY)
Admission: RE | Admit: 2012-11-06 | Discharge: 2012-11-06 | Disposition: A | Discharge status: Home / Self Care | Payer: Medicare Other | Source: Ambulatory Visit | Attending: Orthopaedic Surgery | Admitting: Orthopaedic Surgery

## 2012-11-06 LAB — BASIC METABOLIC PANEL
CO2: 32 mEq/L (ref 19–32)
Calcium: 9.9 mg/dL (ref 8.4–10.5)
Creatinine, Ser: 0.91 mg/dL (ref 0.50–1.35)
GFR calc non Af Amer: 78 mL/min — ABNORMAL LOW (ref >90)

## 2012-11-06 LAB — URINALYSIS, ROUTINE W REFLEX MICROSCOPIC
Bilirubin Urine: NEGATIVE
Ketones, ur: NEGATIVE mg/dL
Nitrite: NEGATIVE
Specific Gravity, Urine: 1.019 (ref 1.005–1.030)
Urobilinogen, UA: 1 mg/dL (ref 0.0–1.0)

## 2012-11-06 LAB — PROTIME-INR
INR: 0.94 (ref 0.00–1.49)
Prothrombin Time: 12.5 seconds (ref 11.6–15.2)

## 2012-11-06 LAB — TYPE AND SCREEN
ABO/RH(D): O POS
Antibody Screen: NEGATIVE

## 2012-11-06 LAB — CBC
MCV: 98.6 fL (ref 78.0–100.0)
Platelets: 266 10*3/uL (ref 150–400)
RDW: 13.8 % (ref 11.5–15.5)
WBC: 6.8 10*3/uL (ref 4.0–10.5)

## 2012-11-06 LAB — ABO/RH: ABO/RH(D): O POS

## 2012-11-06 NOTE — Progress Notes (Signed)
Primary md: Dr. Laurene Footman.  Pt. Doesn't have a cardiologist. Denies echo/stress/heart cath.  Pt. States [facility] was [facility]. [facility]. Came through the ED for his fingers were numb and tingling. Stated he had cxr/ekg done, normal. Will request these studies and ED report/discharge. Pt. States they didn't find anything wrong with him.

## 2012-11-07 NOTE — Consult Note (Addendum)
Anesthesia chart review: Patient is an 79 year old male scheduled for right total hip arthroplasty by Dr. Magnus Ivan on 11/13/2012. History includes former smoker, COPD, asthma, diet controlled diabetes mellitus type 2, hypertension, hyperlipidemia, essential tremor, IBS, peptic ulcer disease, duodenal ulcer with partial obstruction 07/2012.  PCP is Dr. Adrian Prince, who referred patient to Dr. Magnus Ivan.  EKG from [facility] on 10/23/12 showed SR with PACs, right BBB, LAFB, bifascicular block.  Currently, there are no comparison EKGs available.  CXR 1V on 10/23/12 showed bilateral lung fibrosis versus atelectasis.  (See below for 2V CXR report from 05/29/12.)  Preoperative labs noted.  I called and spoke with Mr. Venne.  He denies chest pain and SOB.  He considers himself active, but walking is fairly limited due to hip pain.  He can go up and down stairs without any CV symptoms.  He denies recent syncope/presyncope, persistent LE edema.  He reports good control of his DM.  He was referred to Cardiologist Dr. Katrinka Blazing ~ 3 years ago for a cardiology evaluation and reportedly tests were okay.  Records requested.  I'll follow-up additional records when available.   Shonna Chock, PA-C 11/07/12 1500  Addendum: 11/12/12 1010 PCP and Cardiology offices closed 2 days last week due to inclement weather.  Dr. Michaelle Copas and Dr. Rinaldo Cloud records received today.  Last visit with [name] was on 10/01/12.  HgbA1C was documented as 5.2 at that time.  He referred patient to Dr. Magnus Ivan at that time for evaluation and treatment of hip pain.  Patient's EKG appears stable since at least 06/21/10.  Patient had a 2V CXR on 05/29/12 (PCP) that showed minimal scarring at the lung bases without infiltrates, masses, effusion, or edema.  No active disease.  Cardiologist Dr. Katrinka Blazing saw patient on 06/28/10 following evaluation of syncope with history of abnormal EKG showing right BBB and left anterior hemiblock.  His syncope was felt  likely related to vasovagal response related to defecation.  Dr. Katrinka Blazing did not recommend further work-up unless Holter or echo were abnormal.  Echo on 06/28/10 showed LVEF 55-60%, mild concentric LVH, Doppler findings suggestive of grade 2 diastolic dysfunction with elevated left atrial pressure, trace mitral regurgitation, trivial tricuspid regurgitation, normal estimated RVSP.  Holter read on 07/11/10 showed NSR with average HR 81 bpm, no significant PVCs or PACs, no symptoms, question of short burst of asymptomatic SVT versus ST.   Patient was referred to surgeon by his PCP.  His EKG appears stable since at least 05/2010.  Patient denied any recent syncope or CV symptoms.  His DM is well controlled.  He will be evaluated by his anesthesiologist on the day of surgery.  If no new worrisome findings then would anticipate he could proceed as planned.  Anesthesiologist Dr. Ivin Booty agrees with this plan.

## 2012-11-12 MED ORDER — CEFAZOLIN SODIUM-DEXTROSE 2-3 GM-% IV SOLR
2.0000 g | INTRAVENOUS | Status: AC
  Administered 2012-11-13: 2 g via INTRAVENOUS
  Filled 2012-11-12: qty 50

## 2012-11-13 ENCOUNTER — Inpatient Hospital Stay (HOSPITAL_COMMUNITY)
Admission: RE | Admit: 2012-11-13 | Discharge: 2012-11-16 | DRG: 470 | Disposition: A | Discharge status: Home Health | Payer: Medicare Other | Source: Ambulatory Visit | Attending: Orthopaedic Surgery | Admitting: Orthopaedic Surgery

## 2012-11-13 ENCOUNTER — Inpatient Hospital Stay (HOSPITAL_COMMUNITY): Payer: Medicare Other

## 2012-11-13 ENCOUNTER — Encounter (HOSPITAL_COMMUNITY): Payer: Self-pay | Admitting: Vascular Surgery

## 2012-11-13 ENCOUNTER — Inpatient Hospital Stay (HOSPITAL_COMMUNITY): Payer: Medicare Other | Admitting: Vascular Surgery

## 2012-11-13 ENCOUNTER — Encounter (HOSPITAL_COMMUNITY): Payer: Self-pay | Admitting: Anesthesiology

## 2012-11-13 ENCOUNTER — Encounter (HOSPITAL_COMMUNITY)
Admission: RE | Disposition: A | Discharge status: Home Health | Payer: Self-pay | Source: Ambulatory Visit | Attending: Orthopaedic Surgery

## 2012-11-13 DIAGNOSIS — I10 Essential (primary) hypertension: Secondary | ICD-10-CM | POA: Diagnosis present

## 2012-11-13 DIAGNOSIS — Z87891 Personal history of nicotine dependence: Secondary | ICD-10-CM

## 2012-11-13 DIAGNOSIS — G250 Essential tremor: Secondary | ICD-10-CM | POA: Diagnosis present

## 2012-11-13 DIAGNOSIS — J4489 Other specified chronic obstructive pulmonary disease: Secondary | ICD-10-CM | POA: Diagnosis present

## 2012-11-13 DIAGNOSIS — Z860100 Personal history of colon polyps, unspecified: Secondary | ICD-10-CM

## 2012-11-13 DIAGNOSIS — J449 Chronic obstructive pulmonary disease, unspecified: Secondary | ICD-10-CM | POA: Diagnosis present

## 2012-11-13 DIAGNOSIS — Z01812 Encounter for preprocedural laboratory examination: Secondary | ICD-10-CM

## 2012-11-13 DIAGNOSIS — Z79899 Other long term (current) drug therapy: Secondary | ICD-10-CM

## 2012-11-13 DIAGNOSIS — K648 Other hemorrhoids: Secondary | ICD-10-CM | POA: Diagnosis present

## 2012-11-13 DIAGNOSIS — M169 Osteoarthritis of hip, unspecified: Secondary | ICD-10-CM

## 2012-11-13 DIAGNOSIS — Z7982 Long term (current) use of aspirin: Secondary | ICD-10-CM

## 2012-11-13 DIAGNOSIS — M47812 Spondylosis without myelopathy or radiculopathy, cervical region: Secondary | ICD-10-CM | POA: Diagnosis present

## 2012-11-13 DIAGNOSIS — E119 Type 2 diabetes mellitus without complications: Secondary | ICD-10-CM | POA: Diagnosis present

## 2012-11-13 DIAGNOSIS — G5600 Carpal tunnel syndrome, unspecified upper limb: Secondary | ICD-10-CM | POA: Diagnosis present

## 2012-11-13 DIAGNOSIS — M1610 Unilateral primary osteoarthritis, unspecified hip: Principal | ICD-10-CM | POA: Diagnosis present

## 2012-11-13 DIAGNOSIS — E785 Hyperlipidemia, unspecified: Secondary | ICD-10-CM | POA: Diagnosis present

## 2012-11-13 DIAGNOSIS — E559 Vitamin D deficiency, unspecified: Secondary | ICD-10-CM | POA: Diagnosis present

## 2012-11-13 DIAGNOSIS — K589 Irritable bowel syndrome without diarrhea: Secondary | ICD-10-CM | POA: Diagnosis present

## 2012-11-13 HISTORY — DX: Anesthesia of skin: R20.0

## 2012-11-13 HISTORY — DX: Paresthesia of skin: R20.2

## 2012-11-13 HISTORY — PX: TOTAL HIP ARTHROPLASTY: SHX124

## 2012-11-13 LAB — GLUCOSE, CAPILLARY: Glucose-Capillary: 79 mg/dL (ref 70–99)

## 2012-11-13 SURGERY — ARTHROPLASTY, HIP, TOTAL, ANTERIOR APPROACH
Anesthesia: General | Site: Hip | Laterality: Right | Wound class: Clean

## 2012-11-13 MED ORDER — FENTANYL CITRATE 0.05 MG/ML IJ SOLN
25.0000 ug | INTRAMUSCULAR | Status: DC | PRN
  Administered 2012-11-13 (×4): 25 ug via INTRAVENOUS

## 2012-11-13 MED ORDER — NEOSTIGMINE METHYLSULFATE 1 MG/ML IJ SOLN
INTRAMUSCULAR | Status: DC | PRN
  Administered 2012-11-13: 4 mg via INTRAVENOUS

## 2012-11-13 MED ORDER — KETOROLAC TROMETHAMINE 15 MG/ML IJ SOLN
7.5000 mg | Freq: Four times a day (QID) | INTRAMUSCULAR | Status: AC
  Administered 2012-11-13 – 2012-11-14 (×4): 7.5 mg via INTRAVENOUS
  Filled 2012-11-13 (×4): qty 1

## 2012-11-13 MED ORDER — ONDANSETRON HCL 4 MG PO TABS: 4.0000 mg | ORAL_TABLET | Freq: Four times a day (QID) | ORAL | Status: DC | PRN

## 2012-11-13 MED ORDER — METHOCARBAMOL 100 MG/ML IJ SOLN
500.0000 mg | Freq: Four times a day (QID) | INTRAVENOUS | Status: DC | PRN
  Filled 2012-11-13: qty 5

## 2012-11-13 MED ORDER — FENTANYL CITRATE 0.05 MG/ML IJ SOLN
INTRAMUSCULAR | Status: DC | PRN
  Administered 2012-11-13: 25 ug via INTRAVENOUS
  Administered 2012-11-13 (×4): 50 ug via INTRAVENOUS
  Administered 2012-11-13: 25 ug via INTRAVENOUS

## 2012-11-13 MED ORDER — ONDANSETRON HCL 4 MG/2ML IJ SOLN: 4.0000 mg | Freq: Four times a day (QID) | INTRAMUSCULAR | Status: DC | PRN

## 2012-11-13 MED ORDER — ZOLPIDEM TARTRATE 5 MG PO TABS: 5.0000 mg | ORAL_TABLET | Freq: Every evening | ORAL | Status: DC | PRN

## 2012-11-13 MED ORDER — ASPIRIN EC 325 MG PO TBEC
325.0000 mg | DELAYED_RELEASE_TABLET | Freq: Every day | ORAL | Status: DC
  Administered 2012-11-14 – 2012-11-16 (×3): 325 mg via ORAL
  Filled 2012-11-13 (×4): qty 1

## 2012-11-13 MED ORDER — LIDOCAINE HCL (CARDIAC) 20 MG/ML IV SOLN
INTRAVENOUS | Status: DC | PRN
  Administered 2012-11-13: 50 mg via INTRAVENOUS

## 2012-11-13 MED ORDER — DEXTROSE 50 % IV SOLN
25.0000 mL | INTRAVENOUS | Status: AC
  Administered 2012-11-13: 25 mL via INTRAVENOUS
  Filled 2012-11-13: qty 50

## 2012-11-13 MED ORDER — PHENOL 1.4 % MT LIQD: 1.0000 | OROMUCOSAL | Status: DC | PRN

## 2012-11-13 MED ORDER — 0.9 % SODIUM CHLORIDE (POUR BTL) OPTIME
TOPICAL | Status: DC | PRN
  Administered 2012-11-13: 1000 mL

## 2012-11-13 MED ORDER — METHOCARBAMOL 500 MG PO TABS
500.0000 mg | ORAL_TABLET | Freq: Four times a day (QID) | ORAL | Status: DC | PRN
  Administered 2012-11-14: 500 mg via ORAL
  Filled 2012-11-13 (×2): qty 1

## 2012-11-13 MED ORDER — SODIUM CHLORIDE 0.9 % IV SOLN: INTRAVENOUS | Status: DC

## 2012-11-13 MED ORDER — WHITE PETROLATUM GEL
Status: AC
  Filled 2012-11-13: qty 5

## 2012-11-13 MED ORDER — OXYCODONE HCL 5 MG PO TABS
ORAL_TABLET | ORAL | Status: AC
  Filled 2012-11-13: qty 1

## 2012-11-13 MED ORDER — EPHEDRINE SULFATE 50 MG/ML IJ SOLN
INTRAMUSCULAR | Status: DC | PRN
  Administered 2012-11-13 (×4): 10 mg via INTRAVENOUS

## 2012-11-13 MED ORDER — MENTHOL 3 MG MT LOZG: 1.0000 | LOZENGE | OROMUCOSAL | Status: DC | PRN

## 2012-11-13 MED ORDER — PANCRELIPASE (LIP-PROT-AMYL) 12000-38000 UNITS PO CPEP
1.0000 | ORAL_CAPSULE | Freq: Three times a day (TID) | ORAL | Status: DC
  Administered 2012-11-14 – 2012-11-16 (×7): 1 via ORAL
  Filled 2012-11-13 (×10): qty 1

## 2012-11-13 MED ORDER — ACETAMINOPHEN 325 MG PO TABS: 650.0000 mg | ORAL_TABLET | Freq: Four times a day (QID) | ORAL | Status: DC | PRN

## 2012-11-13 MED ORDER — ONDANSETRON HCL 4 MG/2ML IJ SOLN
INTRAMUSCULAR | Status: DC | PRN
  Administered 2012-11-13: 4 mg via INTRAVENOUS

## 2012-11-13 MED ORDER — MORPHINE SULFATE 2 MG/ML IJ SOLN: 2.0000 mg | INTRAMUSCULAR | Status: DC | PRN

## 2012-11-13 MED ORDER — LACTATED RINGERS IV SOLN
Freq: Once | INTRAVENOUS | Status: AC
  Administered 2012-11-13: 13:00:00 via INTRAVENOUS

## 2012-11-13 MED ORDER — OXYCODONE HCL 5 MG PO TABS
5.0000 mg | ORAL_TABLET | Freq: Once | ORAL | Status: AC | PRN
  Administered 2012-11-13: 5 mg via ORAL

## 2012-11-13 MED ORDER — DIPHENHYDRAMINE HCL 12.5 MG/5ML PO ELIX: 12.5000 mg | ORAL_SOLUTION | ORAL | Status: DC | PRN

## 2012-11-13 MED ORDER — OXYCODONE HCL 5 MG PO TABS
5.0000 mg | ORAL_TABLET | ORAL | Status: DC | PRN
  Administered 2012-11-14: 10 mg via ORAL
  Administered 2012-11-15: 5 mg via ORAL
  Filled 2012-11-13: qty 1
  Filled 2012-11-13: qty 2

## 2012-11-13 MED ORDER — METOCLOPRAMIDE HCL 10 MG PO TABS: 5.0000 mg | ORAL_TABLET | Freq: Three times a day (TID) | ORAL | Status: DC | PRN

## 2012-11-13 MED ORDER — PANTOPRAZOLE SODIUM 40 MG PO TBEC
40.0000 mg | DELAYED_RELEASE_TABLET | Freq: Every day | ORAL | Status: DC
  Administered 2012-11-14 – 2012-11-16 (×3): 40 mg via ORAL
  Filled 2012-11-13 (×3): qty 1

## 2012-11-13 MED ORDER — PROPOFOL 10 MG/ML IV BOLUS
INTRAVENOUS | Status: DC | PRN
  Administered 2012-11-13: 150 mg via INTRAVENOUS

## 2012-11-13 MED ORDER — FENTANYL CITRATE 0.05 MG/ML IJ SOLN
INTRAMUSCULAR | Status: AC
  Filled 2012-11-13: qty 2

## 2012-11-13 MED ORDER — GLYCOPYRROLATE 0.2 MG/ML IJ SOLN
INTRAMUSCULAR | Status: DC | PRN
  Administered 2012-11-13: 0.6 mg via INTRAVENOUS

## 2012-11-13 MED ORDER — ROCURONIUM BROMIDE 100 MG/10ML IV SOLN
INTRAVENOUS | Status: DC | PRN
  Administered 2012-11-13: 50 mg via INTRAVENOUS

## 2012-11-13 MED ORDER — DOCUSATE SODIUM 100 MG PO CAPS
100.0000 mg | ORAL_CAPSULE | Freq: Two times a day (BID) | ORAL | Status: DC
  Administered 2012-11-13 – 2012-11-16 (×6): 100 mg via ORAL
  Filled 2012-11-13 (×8): qty 1

## 2012-11-13 MED ORDER — CEFAZOLIN SODIUM 1-5 GM-% IV SOLN
1.0000 g | Freq: Four times a day (QID) | INTRAVENOUS | Status: AC
  Administered 2012-11-13 – 2012-11-14 (×2): 1 g via INTRAVENOUS
  Filled 2012-11-13 (×2): qty 50

## 2012-11-13 MED ORDER — ARTIFICIAL TEARS OP OINT
TOPICAL_OINTMENT | OPHTHALMIC | Status: DC | PRN
  Administered 2012-11-13: 1 via OPHTHALMIC

## 2012-11-13 MED ORDER — LACTATED RINGERS IV SOLN
INTRAVENOUS | Status: DC | PRN
  Administered 2012-11-13 (×2): via INTRAVENOUS

## 2012-11-13 MED ORDER — FERROUS SULFATE 325 (65 FE) MG PO TABS
325.0000 mg | ORAL_TABLET | Freq: Three times a day (TID) | ORAL | Status: DC
  Administered 2012-11-13 – 2012-11-16 (×8): 325 mg via ORAL
  Filled 2012-11-13 (×11): qty 1

## 2012-11-13 MED ORDER — OXYCODONE HCL ER 10 MG PO T12A
10.0000 mg | EXTENDED_RELEASE_TABLET | Freq: Two times a day (BID) | ORAL | Status: DC
  Administered 2012-11-13 – 2012-11-16 (×6): 10 mg via ORAL
  Filled 2012-11-13 (×6): qty 1

## 2012-11-13 MED ORDER — ALUM & MAG HYDROXIDE-SIMETH 200-200-20 MG/5ML PO SUSP: 30.0000 mL | ORAL | Status: DC | PRN

## 2012-11-13 MED ORDER — OXYCODONE HCL 5 MG/5ML PO SOLN: 5.0000 mg | Freq: Once | ORAL | Status: AC | PRN

## 2012-11-13 MED ORDER — PHENYLEPHRINE HCL 10 MG/ML IJ SOLN
INTRAMUSCULAR | Status: DC | PRN
  Administered 2012-11-13 (×2): 40 ug via INTRAVENOUS

## 2012-11-13 MED ORDER — ACETAMINOPHEN 650 MG RE SUPP: 650.0000 mg | Freq: Four times a day (QID) | RECTAL | Status: DC | PRN

## 2012-11-13 MED ORDER — SIMVASTATIN 10 MG PO TABS
10.0000 mg | ORAL_TABLET | Freq: Every day | ORAL | Status: DC
  Administered 2012-11-13 – 2012-11-15 (×3): 10 mg via ORAL
  Filled 2012-11-13 (×4): qty 1

## 2012-11-13 MED ORDER — ALBUMIN HUMAN 5 % IV SOLN
INTRAVENOUS | Status: DC | PRN
  Administered 2012-11-13: 13:00:00 via INTRAVENOUS

## 2012-11-13 MED ORDER — METOCLOPRAMIDE HCL 5 MG/ML IJ SOLN: 5.0000 mg | Freq: Three times a day (TID) | INTRAMUSCULAR | Status: DC | PRN

## 2012-11-13 SURGICAL SUPPLY — 60 items
ADH SKN CLS LQ APL DERMABOND (GAUZE/BANDAGES/DRESSINGS) ×1
BANDAGE GAUZE ELAST BULKY 4 IN (GAUZE/BANDAGES/DRESSINGS) IMPLANT
BLADE SAW SGTL 18X1.27X75 (BLADE) ×2 IMPLANT
BLADE SURG ROTATE 9660 (MISCELLANEOUS) IMPLANT
BNDG COHESIVE 6X5 TAN STRL LF (GAUZE/BANDAGES/DRESSINGS) IMPLANT
CELLS DAT CNTRL 66122 CELL SVR (MISCELLANEOUS) ×1 IMPLANT
CLOTH BEACON ORANGE TIMEOUT ST (SAFETY) ×2 IMPLANT
COVER BACK TABLE 24X17X13 BIG (DRAPES) IMPLANT
COVER SURGICAL LIGHT HANDLE (MISCELLANEOUS) ×2 IMPLANT
DERMABOND ADHESIVE PROPEN (GAUZE/BANDAGES/DRESSINGS) ×1
DERMABOND ADVANCED .7 DNX6 (GAUZE/BANDAGES/DRESSINGS) IMPLANT
DRAPE C-ARM 42X72 X-RAY (DRAPES) ×2 IMPLANT
DRAPE STERI IOBAN 125X83 (DRAPES) ×2 IMPLANT
DRAPE U-SHAPE 47X51 STRL (DRAPES) ×6 IMPLANT
DRSG AQUACEL AG ADV 3.5X10 (GAUZE/BANDAGES/DRESSINGS) ×1 IMPLANT
DRSG MEPILEX BORDER 4X8 (GAUZE/BANDAGES/DRESSINGS) ×2 IMPLANT
DURAPREP 26ML APPLICATOR (WOUND CARE) ×2 IMPLANT
ELECT BLADE 4.0 EZ CLEAN MEGAD (MISCELLANEOUS)
ELECT BLADE TIP CTD 4 INCH (ELECTRODE) ×2 IMPLANT
ELECT CAUTERY BLADE 6.4 (BLADE) ×2 IMPLANT
ELECT REM PT RETURN 9FT ADLT (ELECTROSURGICAL) ×2
ELECTRODE BLDE 4.0 EZ CLN MEGD (MISCELLANEOUS) IMPLANT
ELECTRODE REM PT RTRN 9FT ADLT (ELECTROSURGICAL) ×1 IMPLANT
FACESHIELD LNG OPTICON STERILE (SAFETY) ×4 IMPLANT
GAUZE XEROFORM 1X8 LF (GAUZE/BANDAGES/DRESSINGS) ×2 IMPLANT
GLOVE BIO SURGEON STRL SZ7.5 (GLOVE) ×2 IMPLANT
GLOVE BIOGEL PI IND STRL 7.5 (GLOVE) ×1 IMPLANT
GLOVE BIOGEL PI IND STRL 8 (GLOVE) ×1 IMPLANT
GLOVE BIOGEL PI INDICATOR 7.5 (GLOVE) ×1
GLOVE BIOGEL PI INDICATOR 8 (GLOVE) ×1
GLOVE ECLIPSE 7.0 STRL STRAW (GLOVE) ×2 IMPLANT
GLOVE ORTHO TXT STRL SZ7.5 (GLOVE) ×2 IMPLANT
GOWN PREVENTION PLUS LG XLONG (DISPOSABLE) IMPLANT
GOWN PREVENTION PLUS XLARGE (GOWN DISPOSABLE) ×2 IMPLANT
GOWN STRL NON-REIN LRG LVL3 (GOWN DISPOSABLE) ×4 IMPLANT
GOWN STRL REIN XL XLG (GOWN DISPOSABLE) ×2 IMPLANT
KIT BASIN OR (CUSTOM PROCEDURE TRAY) ×2 IMPLANT
KIT ROOM TURNOVER OR (KITS) ×2 IMPLANT
MANIFOLD NEPTUNE II (INSTRUMENTS) ×2 IMPLANT
NS IRRIG 1000ML POUR BTL (IV SOLUTION) ×2 IMPLANT
PACK TOTAL JOINT (CUSTOM PROCEDURE TRAY) ×2 IMPLANT
PAD ARMBOARD 7.5X6 YLW CONV (MISCELLANEOUS) ×4 IMPLANT
RETRACTOR WND ALEXIS 18 MED (MISCELLANEOUS) ×1 IMPLANT
RTRCTR WOUND ALEXIS 18CM MED (MISCELLANEOUS) ×2
SPONGE LAP 18X18 X RAY DECT (DISPOSABLE) ×2 IMPLANT
SPONGE LAP 4X18 X RAY DECT (DISPOSABLE) IMPLANT
STAPLER SKIN PROX WIDE 3.9 (STAPLE) ×2 IMPLANT
STAPLER VISISTAT 35W (STAPLE) ×2 IMPLANT
SUT ETHIBOND NAB CT1 #1 30IN (SUTURE) ×4 IMPLANT
SUT VIC AB 0 CT1 27 (SUTURE) ×4
SUT VIC AB 0 CT1 27XBRD ANBCTR (SUTURE) ×2 IMPLANT
SUT VIC AB 1 CT1 27 (SUTURE) ×4
SUT VIC AB 1 CT1 27XBRD ANBCTR (SUTURE) ×2 IMPLANT
SUT VIC AB 2-0 CT1 27 (SUTURE) ×4
SUT VIC AB 2-0 CT1 TAPERPNT 27 (SUTURE) ×2 IMPLANT
SUT VLOC 180 0 24IN GS25 (SUTURE) ×2 IMPLANT
TOWEL OR 17X24 6PK STRL BLUE (TOWEL DISPOSABLE) ×2 IMPLANT
TOWEL OR 17X26 10 PK STRL BLUE (TOWEL DISPOSABLE) ×2 IMPLANT
TRAY FOLEY CATH 14FR (SET/KITS/TRAYS/PACK) IMPLANT
WATER STERILE IRR 1000ML POUR (IV SOLUTION) ×4 IMPLANT

## 2012-11-13 NOTE — Anesthesia Postprocedure Evaluation (Signed)
Anesthesia Post Note  Patient: Aaron Hendrix  Procedure(s) Performed: Procedure(s) (LRB): TOTAL HIP ARTHROPLASTY ANTERIOR APPROACH (Right)  Anesthesia type: general  Patient location: PACU  Post pain: Pain level controlled  Post assessment: Patient's Cardiovascular Status Stable  Last Vitals:  Filed Vitals:   11/13/12 1558  BP: 119/58  Pulse: 60  Temp:   Resp: 21    Post vital signs: Reviewed and stable  Level of consciousness: sedated  Complications: No apparent anesthesia complications

## 2012-11-13 NOTE — Transfer of Care (Signed)
Immediate Anesthesia Transfer of Care Note  Patient: Aaron Hendrix  Procedure(s) Performed: Procedure(s) with comments: TOTAL HIP ARTHROPLASTY ANTERIOR APPROACH (Right) - Right total hip arthroplasty  Patient Location: PACU  Anesthesia Type:General  Level of Consciousness: awake, alert  and oriented  Airway & Oxygen Therapy: Patient Spontanous Breathing and Patient connected to nasal cannula oxygen  Post-op Assessment: Report given to PACU RN, Post -op Vital signs reviewed and stable, Patient moving all extremities and Patient able to stick tongue midline  Post vital signs: Reviewed and stable  Complications: No apparent anesthesia complications

## 2012-11-13 NOTE — Anesthesia Procedure Notes (Signed)
Procedure Name: Intubation Date/Time: 11/13/2012 12:58 PM Performed by: Luster Landsberg Pre-anesthesia Checklist: Patient identified, Emergency Drugs available, Suction available and Patient being monitored Patient Re-evaluated:Patient Re-evaluated prior to inductionOxygen Delivery Method: Circle system utilized Preoxygenation: Pre-oxygenation with 100% oxygen Intubation Type: IV induction Ventilation: Mask ventilation without difficulty Laryngoscope Size: Mac and 3 Grade View: Grade II Tube type: Oral Tube size: 7.5 mm Number of attempts: 1 Airway Equipment and Method: Stylet Placement Confirmation: ETT inserted through vocal cords under direct vision,  positive ETCO2 and breath sounds checked- equal and bilateral Secured at: 23 cm Tube secured with: Tape Dental Injury: Teeth and Oropharynx as per pre-operative assessment  Comments: Pt's lips noted with some bleeding post mask, prior to DVL. No change with DVL.

## 2012-11-13 NOTE — Anesthesia Preprocedure Evaluation (Signed)
Anesthesia Evaluation  Patient identified by MRN, date of birth, ID band Patient awake    Reviewed: Allergy & Precautions, H&P , NPO status , Patient's Chart, lab work & pertinent test results  Airway Mallampati: II  Neck ROM: full    Dental   Pulmonary asthma , COPDformer smoker,          Cardiovascular hypertension,     Neuro/Psych  Neuromuscular disease    GI/Hepatic PUD,   Endo/Other  diabetes, Type 2  Renal/GU      Musculoskeletal   Abdominal   Peds  Hematology   Anesthesia Other Findings   Reproductive/Obstetrics                           Anesthesia Physical Anesthesia Plan  ASA: III  Anesthesia Plan: General   Post-op Pain Management:    Induction: Intravenous  Airway Management Planned: Oral ETT  Additional Equipment:   Intra-op Plan:   Post-operative Plan: Extubation in OR  Informed Consent: I have reviewed the patients History and Physical, chart, labs and discussed the procedure including the risks, benefits and alternatives for the proposed anesthesia with the patient or authorized representative who has indicated his/her understanding and acceptance.     Plan Discussed with: CRNA and Surgeon  Anesthesia Plan Comments:         Anesthesia Quick Evaluation

## 2012-11-13 NOTE — H&P (Signed)
TOTAL HIP ADMISSION H&P  Patient is admitted for right total hip arthroplasty.  Subjective:  Chief Complaint: right hip pain  HPI: Aaron Hendrix, 79 y.o. male, has a history of pain and functional disability in the right hip(s) due to arthritis and patient has failed non-surgical conservative treatments for greater than 12 weeks to include NSAID's and/or analgesics, corticosteriod injections, use of assistive devices and activity modification.  Onset of symptoms was gradual starting 5 years ago with gradually worsening course since that time.The patient noted no past surgery on the right hip(s).  Patient currently rates pain in the right hip at 9 out of 10 with activity. Patient has night pain, worsening of pain with activity and weight bearing, trendelenberg gait, pain that interfers with activities of daily living, pain with passive range of motion and crepitus. Patient has evidence of subchondral cysts, subchondral sclerosis, periarticular osteophytes and joint space narrowing by imaging studies. This condition presents safety issues increasing the risk of falls.  There is no current active infection.  Patient Active Problem List   Diagnosis Date Noted  . Degenerative arthritis of hip 11/13/2012  . Nausea 08/10/2012  . Duodenal ulcer, with partial obstruction 08/10/2012  . Hypertension 05/25/2011  . Diabetes mellitus type II 05/25/2011  . Irritable bowel syndrome 03/31/2008  . PERSONAL HX COLONIC POLYPS 03/31/2008   Past Medical History  Diagnosis Date  . COPD with asthma   . Diabetes mellitus     Diet control   . Hypertension   . Hyperlipidemia   . Carpal tunnel syndrome   . DJD (degenerative joint disease), cervical   . Vitamin D deficiency   . Tremor, essential   . Internal hemorrhoids   . Personal history of colonic polyps     adenomatous  . IBS (irritable bowel syndrome)   . Peptic ulcer   . Duodenal ulcer, with partial obstruction 08/10/2012  . Numbness and tingling  in right hand     started 2 yeas ago    Past Surgical History  Procedure Laterality Date  . Anal fissure repair    . Colonoscopy w/ biopsies and polypectomy  8/03, 6/05, 7/09, 9/10    internal hemorrhoids, tubular adenomas, mucosa & lymphoid nodules  . Upper gastrointestinal endoscopy  3/05, 7/09, 9/10,2013    gastritis, duodenitis    Prescriptions prior to admission  Medication Sig Dispense Refill  . acetaminophen (TYLENOL) 500 MG tablet Take 500-1,000 mg by mouth every 6 (six) hours as needed for pain. For pain.      Marland Kitchen aspirin 81 MG tablet Take 81 mg by mouth daily.        . Pancrelipase, Lip-Prot-Amyl, (CREON) 24000 UNITS CPEP Take 1 capsule (24,000 Units total) by mouth 3 (three) times daily with meals.  180 capsule  11  . pantoprazole (PROTONIX) 40 MG tablet Take 1 tablet (40 mg total) by mouth daily.  30 tablet  11  . pravastatin (PRAVACHOL) 20 MG tablet Take 20 mg by mouth daily.        . Vitamin D, Ergocalciferol, (DRISDOL) 50000 UNITS CAPS Take 50,000 Units by mouth every 7 (seven) days. Friday.       Allergies  Allergen Reactions  . Quinapril Hcl Other (See Comments)    Unknown   . Latex Rash    History  Substance Use Topics  . Smoking status: Former Games developer  . Smokeless tobacco: Never Used  . Alcohol Use: No     Comment: 2-3 drinks/day wine    Family  History  Problem Relation Age of Onset  . Colon cancer Neg Hx   . Leukemia Maternal Aunt      Review of Systems  Musculoskeletal: Positive for joint pain.  All other systems reviewed and are negative.    Objective:  Physical Exam  Constitutional: He is oriented to person, place, and time. He appears well-developed and well-nourished.  HENT:  Head: Normocephalic and atraumatic.  Eyes: EOM are normal. Pupils are equal, round, and reactive to light.  Neck: Normal range of motion. Neck supple.  Cardiovascular: Normal rate and regular rhythm.   Respiratory: Effort normal and breath sounds normal.  GI: Soft.  Bowel sounds are normal.  Musculoskeletal:       Right hip: He exhibits decreased range of motion, decreased strength, tenderness and bony tenderness.  Neurological: He is alert and oriented to person, place, and time.  Skin: Skin is warm and dry.  Psychiatric: He has a normal mood and affect.    Vital signs in last 24 hours: Temp:  [97.8 F (36.6 C)] 97.8 F (36.6 C) (02/18 1029) Pulse Rate:  [94] 94 (02/18 1029) Resp:  [18] 18 (02/18 1029) BP: (124)/(73) 124/73 mmHg (02/18 1029) SpO2:  [99 %] 99 % (02/18 1029)  Labs:   Estimated body mass index is 23.13 kg/(m^2) as calculated from the following:   Height as of 11/06/12: 5\' 5"  (1.651 m).   Weight as of 09/27/12: 63.05 kg (139 lb).   Imaging Review Plain radiographs demonstrate severe degenerative joint disease of the right hip(s). The bone quality appears to be good for age and reported activity level.  Assessment/Plan:  End stage arthritis, right hip(s)  The patient history, physical examination, clinical judgement of the provider and imaging studies are consistent with end stage degenerative joint disease of the right hip(s) and total hip arthroplasty is deemed medically necessary. The treatment options including medical management, injection therapy, arthroscopy and arthroplasty were discussed at length. The risks and benefits of total hip arthroplasty were presented and reviewed. The risks due to aseptic loosening, infection, stiffness, dislocation/subluxation,  thromboembolic complications and other imponderables were discussed.  The patient acknowledged the explanation, agreed to proceed with the plan and consent was signed. Patient is being admitted for inpatient treatment for surgery, pain control, PT, OT, prophylactic antibiotics, VTE prophylaxis, progressive ambulation and ADL's and discharge planning.The patient is planning to be discharged home with home health services

## 2012-11-13 NOTE — Brief Op Note (Signed)
11/13/2012  2:58 PM  PATIENT:  Aaron Hendrix  79 y.o. male  PRE-OPERATIVE DIAGNOSIS:  Osteoarthritis right hip  POST-OPERATIVE DIAGNOSIS:  Osteoarthritis right hip  PROCEDURE:  Procedure(s) with comments: TOTAL HIP ARTHROPLASTY ANTERIOR APPROACH (Right) - Right total hip arthroplasty  SURGEON:  Surgeon(s) and Role:    * Kathryne Hitch, MD - Primary  PHYSICIAN ASSISTANT:   ASSISTANTS: none   ANESTHESIA:   none  EBL:  Total I/O In: 1250 [I.V.:1000; IV Piggyback:250] Out: 400 [Urine:50; Blood:350]  BLOOD ADMINISTERED:none  DRAINS: none   LOCAL MEDICATIONS USED:  NONE  SPECIMEN:  No Specimen  DISPOSITION OF SPECIMEN:  N/A  COUNTS:  YES  TOURNIQUET:  * No tourniquets in log *  DICTATION: .Other Dictation: Dictation Number [id number]  PLAN OF CARE: Admit to inpatient   PATIENT DISPOSITION:  PACU - hemodynamically stable.   Delay start of Pharmacological VTE agent (>24hrs) due to surgical blood loss or risk of bleeding: no

## 2012-11-14 LAB — CBC
Hemoglobin: 10.4 g/dL — ABNORMAL LOW (ref 13.0–17.0)
MCH: 31.7 pg (ref 26.0–34.0)
MCHC: 32.6 g/dL (ref 30.0–36.0)
MCV: 97.3 fL (ref 78.0–100.0)
RBC: 3.28 MIL/uL — ABNORMAL LOW (ref 4.22–5.81)

## 2012-11-14 LAB — BASIC METABOLIC PANEL
CO2: 30 mEq/L (ref 19–32)
Calcium: 8.4 mg/dL (ref 8.4–10.5)
Creatinine, Ser: 0.84 mg/dL (ref 0.50–1.35)
GFR calc non Af Amer: 81 mL/min — ABNORMAL LOW (ref >90)
Glucose, Bld: 164 mg/dL — ABNORMAL HIGH (ref 70–99)
Sodium: 134 mEq/L — ABNORMAL LOW (ref 135–145)

## 2012-11-14 LAB — GLUCOSE, CAPILLARY: Glucose-Capillary: 112 mg/dL — ABNORMAL HIGH (ref 70–99)

## 2012-11-14 NOTE — Progress Notes (Signed)
UR COMPLETED  

## 2012-11-14 NOTE — Progress Notes (Signed)
Physical Therapy Treatment Patient Details Name: Aaron Hendrix MRN: [id number] DOB: [birth year] Today's Date: 11/14/2012 Time: 4098-1191 PT Time Calculation (min): 25 min  PT Assessment / Plan / Recommendation Comments on Treatment Session  79 yo adm for Rt THA (direct anterior approach) with limitations in mobility due to pain. Pt continues to require vc for safe/correct use of DME and physical assist to manage RLE due to pain. Pt reports he may go home Thurs or Friday per discussion with MD. Will still need further PT sessions for mobility training (especially bed and stairs).    Follow Up Recommendations  Home health PT;Supervision for mobility/OOB                 Equipment Recommendations  Rolling walker with 5" wheels    Recommendations for Other Services OT consult  Frequency 7X/week   Plan Discharge plan remains appropriate;Frequency remains appropriate    Precautions / Restrictions Precautions Precautions: None Precaution Comments: per MD orders Restrictions RLE Weight Bearing: Weight bearing as tolerated   Pertinent Vitals/Pain Rt hip 7/10; pre-medicated; ROM exercises; repositioned and applied ice    Mobility  Bed Mobility Bed Mobility: Rolling Right;Right Sidelying to Sit;Sitting - Scoot to Edge of Bed;Sit to Supine Rolling Right: 5: Set up;With rail Right Sidelying to Sit: 4: Min assist;HOB flat;With rails Sitting - Scoot to Edge of Bed: 7: Independent Sit to Supine: 4: Min assist;HOB flat Details for Bed Mobility Assistance: more pain and required incr assist with RLE Transfers Transfers: Sit to Stand;Stand to Sit Sit to Stand: 4: Min guard;With upper extremity assist;From bed;From toilet Stand to Sit: 4: Min guard;With upper extremity assist;To bed;To toilet Details for Transfer Assistance: vc for safe use of RW/hand placement; minguard for safety; pt denied dizziness; steady on feet Ambulation/Gait Ambulation/Gait Assistance: 4: Min guard Ambulation  Distance (Feet): 45 Feet (10, 35 (seated rest in bathroom)) Assistive device: Rolling walker Ambulation/Gait Assistance Details: vc for sequencing to incr comfort; vc for safe use of RW (pt tends to push RW too far ahead and poorly uses UEs to support his weight);  Gait Pattern: Step-through pattern;Decreased stride length;Right foot flat;Left foot flat;Antalgic Gait velocity: decr    Exercises Total Joint Exercises Ankle Circles/Pumps: AROM;Both;10 reps;Supine Quad Sets: AROM;Both;10 reps;Supine Short Arc Quad: AROM;Right;10 reps;Supine Heel Slides: AAROM;Right;10 reps;Supine Hip ABduction/ADduction: AAROM;Right;10 reps;Supine       PT Goals Acute Rehab PT Goals Pt will go Supine/Side to Sit: with supervision;with HOB 0 degrees PT Goal: Supine/Side to Sit - Progress: Progressing toward goal Pt will go Sit to Supine/Side: with supervision;with HOB 0 degrees PT Goal: Sit to Supine/Side - Progress: Progressing toward goal Pt will go Sit to Stand: with supervision;with upper extremity assist PT Goal: Sit to Stand - Progress: Progressing toward goal Pt will go Stand to Sit: with supervision;with upper extremity assist PT Goal: Stand to Sit - Progress: Progressing toward goal Pt will Ambulate: 51 - 150 feet;with supervision;with least restrictive assistive device PT Goal: Ambulate - Progress: Progressing toward goal Pt will Perform Home Exercise Program: with supervision, verbal cues required/provided PT Goal: Perform Home Exercise Program - Progress: Progressing toward goal  Visit Information  Last PT Received On: 11/14/12 Assistance Needed: +1                   End of Session PT - End of Session Activity Tolerance: Patient tolerated treatment well Patient left: in bed;with call bell/phone within reach;with family/visitor present Nurse Communication: Mobility status;Other (comment) (returned to bed  for comfort; ice to Rt hip)   GP     Lynesha Bango 11/14/2012, 4:03 PM Pager  [phone]

## 2012-11-14 NOTE — Evaluation (Signed)
Physical Therapy Evaluation Patient Details Name: Aaron Hendrix MRN: [id number] DOB: November 10, 1929 Today's Date: 11/14/2012 Time: 1191-4782 PT Time Calculation (min): 27 min  PT Assessment / Plan / Recommendation Clinical Impression  79 yo adm for Rt THA (direct anterior approach) with limitations in mobility due to pain. Pt has never used DME and requires education in safe use to maximize safety and independence with mobility prior to d/c home with elderly wife.    PT Assessment  Patient needs continued PT services    Follow Up Recommendations  Home health PT;Supervision for mobility/OOB    Does the patient have the potential to tolerate intense rehabilitation      Barriers to Discharge Decreased caregiver support wife can only provide limited assistance    Equipment Recommendations  Rolling walker with 5" wheels    Recommendations for Other Services OT consult   Frequency 7X/week    Precautions / Restrictions Precautions Precautions: None Precaution Comments: per MD orders Restrictions Weight Bearing Restrictions: Yes RLE Weight Bearing: Weight bearing as tolerated   Pertinent Vitals/Pain Rt hip 0/10 at rest; 5/10 with activity; pt pre-medicated for pain and repositioned with ice to Rt hip at end of session SaO2 98% on 2L O2; 97% on RA at rest and with activity      Mobility  Bed Mobility Bed Mobility: Rolling Right;Right Sidelying to Sit;Sitting - Scoot to Edge of Bed Rolling Right: 5: Set up;With rail Right Sidelying to Sit: 4: Min guard;HOB flat;With rails Sitting - Scoot to Edge of Bed: 7: Independent Details for Bed Mobility Assistance: discussed will practice without rails prior to d/c home Transfers Transfers: Sit to Stand;Stand to Sit Sit to Stand: 4: Min guard;With upper extremity assist;From bed Stand to Sit: 4: Min guard;With upper extremity assist;With armrests;To chair/3-in-1 Details for Transfer Assistance: vc for safe use of RW/hand placement;  minguard for safety; pt denied dizziness; steady on feet Ambulation/Gait Ambulation/Gait Assistance: 4: Min guard Ambulation Distance (Feet): 40 Feet Assistive device: Rolling walker Ambulation/Gait Assistance Details: vc for sequencing to incr comfort; vc for safe use of RW (pt tends to push RW too far ahead); vc to normalize gait Gait Pattern: Step-through pattern;Decreased stride length;Right foot flat;Left foot flat;Antalgic Gait velocity: decr    Exercises Total Joint Exercises Ankle Circles/Pumps: AROM;Both;20 reps;Supine Quad Sets: AROM;Both;10 reps;Seated (in recliner) Heel Slides: AROM;Right;5 reps;Supine   PT Diagnosis: Difficulty walking;Acute pain  PT Problem List: Decreased range of motion;Decreased activity tolerance;Decreased mobility;Decreased knowledge of use of DME;Pain PT Treatment Interventions: DME instruction;Gait training;Functional mobility training;Therapeutic activities;Therapeutic exercise;Patient/family education   PT Goals Acute Rehab PT Goals PT Goal Formulation: With patient/family Time For Goal Achievement: 11/16/12 Potential to Achieve Goals: Good Pt will go Supine/Side to Sit: with supervision;with HOB 0 degrees PT Goal: Supine/Side to Sit - Progress: Goal set today Pt will go Sit to Supine/Side: with supervision;with HOB 0 degrees PT Goal: Sit to Supine/Side - Progress: Goal set today Pt will go Sit to Stand: with supervision;with upper extremity assist PT Goal: Sit to Stand - Progress: Goal set today Pt will go Stand to Sit: with supervision;with upper extremity assist PT Goal: Stand to Sit - Progress: Goal set today Pt will Ambulate: 51 - 150 feet;with supervision;with least restrictive assistive device PT Goal: Ambulate - Progress: Goal set today Pt will Perform Home Exercise Program: with supervision, verbal cues required/provided (to increase RLE AROM, decr pain and edema) PT Goal: Perform Home Exercise Program - Progress: Goal set  today  Visit Information  Last  PT Received On: 11/14/12 Assistance Needed: +1    Subjective Data  Subjective: Wife inquiring re: bathroom equipment (deferred to OT) Patient Stated Goal: go home when ready   Prior Functioning  Home Living Lives With: Spouse Available Help at Discharge: Family;Available 24 hours/day Type of Home: House Home Access: Level entry Home Layout: Laundry or work area in basement;Able to live on main level with bedroom/bathroom Home Adaptive Equipment: None Prior Function Level of Independence: Independent Able to Take Stairs?: Reciprically Driving: Yes Vocation: Retired 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, building services: No difficulties    Copywriter, advertising Overall Cognitive Status: Appears within functional limits for tasks assessed/performed Arousal/Alertness: Awake/alert Orientation Level: Oriented X4 / Intact Behavior During Session: [facility] for tasks performed    Extremity/Trunk Assessment Right Lower Extremity Assessment RLE ROM/Strength/Tone: Deficits;Due to pain RLE ROM/Strength/Tone Deficits: AAROM wfl for tasks assessed; at least 3+/5 strength throughout RLE Sensation: WFL - Light Touch RLE Coordination: WFL - gross motor Left Lower Extremity Assessment LLE ROM/Strength/Tone: WFL for tasks assessed LLE Sensation: WFL - Light Touch LLE Coordination: WFL - gross motor Trunk Assessment Trunk Assessment: Normal       End of Session PT - End of Session Equipment Utilized During Treatment: Gait belt Activity Tolerance: Patient tolerated treatment well Patient left: in chair;with call bell/phone within reach;with family/visitor present Nurse Communication: Mobility status (spoke with Plum, NT)  GP     Nasiya Pascual 11/14/2012, 9:22 AM Pager [phone]

## 2012-11-14 NOTE — Op Note (Signed)
NAME:  JEFFRIESPheng, Prokop NO.:  [id number]  MEDICAL RECORD NO.:  [id number]  LOCATION:  [id number]                        FACILITY:  [facility]  PHYSICIAN:  Vanita Panda. Magnus Ivan, M.D.DATE OF BIRTH:  [birth year]  DATE OF PROCEDURE:  11/13/2012 DATE OF DISCHARGE:                              OPERATIVE REPORT   PREOPERATIVE DIAGNOSIS:  End-stage arthritis and degenerative joint disease, right hip.  POSTOPERATIVE DIAGNOSIS:  End-stage arthritis and degenerative joint disease, right hip.  PROCEDURE:  Right total hip arthroplasty due to right anterior approach.  IMPLANTS:  DePuy Sector Gription acetabular component size 52, size 36, +4 neutral polyethylene liner, size 10 Corail femoral component with standard offset, size 36 -2 metal hip ball.  SURGEON:  Vanita Panda. Magnus Ivan, M.D.  ANESTHESIA:  General.  ANTIBIOTICS:  2 g IV Ancef.  ESTIMATED BLOOD LOSS:  350 mL.  COMPLICATIONS:  None.  INDICATIONS:  Mr. Nave is an 79 year old gentleman with end-stage arthritis.  This been well documented involving his right hip.  He has tried conservative treatment including anti-inflammatory medications, steroid injection, and activity modification, has gotten where he has had x-rays that show bone-on-bone wear with periarticular osteophytes, subchondral sclerosis and cyst, and severe joint space narrowing.  His activities of daily living are greatly affected.  He understands the risks of acute blood loss anemia, DVT, infection, fracture, PE, and does wish to proceed with the goals of increased mobility and decreased pain.  DESCRIPTION OF PROCEDURE:  After informed consent was obtained, appropriate right hip was marked, he was brought to the operating room. General anesthesia was obtained, with release on the stretcher.  A Foley catheter was placed and both feet had traction boots applied to them. He was then placed supine on the Hana fracture table with the perineal post  in place and both legs in inline skeletal traction, but no traction applied.  We then assessed the hip center and pelvis, and left and right hips under direct fluoroscopy.  His right operative hip showed the aforementioned arthritis and we were able to gain the views of the center of the pelvis so we could judge our leg lengths.  We then moved out the fluoroscopy unit and prepped his right hip with DuraPrep and sterile drapes.  Time-out was called.  He was identified as correct patient, correct right hip.  I then made an incision just inferior and posterior to the anterior-superior iliac spine and dissected down to the tensor fascia lata.  The tensor fascia was then divided longitudinally and I proceeded with a direct anterior approach to the hip.  A Cobra retractor was placed around the lateral neck and then up underneath the rectus femoris around the medial neck.  I cauterized the lateral femoral circumflex vessels and then divided the hip capsule in an L-shaped format and placed the Cobra retractors within the joint capsule.  I then made my femoral neck cut with an oscillating saw just proximal to the lesser trochanter and completed this with an osteotome.  I then used a corkscrew guide in the femoral head and removed the femoral head in its entirety.  He was found to be devoid of cartilage with significant hardness and osteophytes.  I then  placed a bent Hohmann medially and a Cobra retractor laterally.  I cleaned the acetabular debris and began reaming from a size 42 reamer in 2 mm increments up to a size 52 with all reamers placed under direct visualization.  The last reamer also placed under direct fluoroscopy, so I could assess my depth of reaming, my inclination and anteversion.  Once I was pleased with this, I placed the real DePuy sector Gription acetabular component size 52, and the real 36+ 4 neutral polyethylene liner.  Attention was then turned to the femur with the leg  externally rotated to 90 degrees, extended and adducted to gain access to the femoral canal after I placed a Mueller retractor medially and Hohmann behind the greater trochanter.  I released the lateral capsule and then used a box cutting guide to open up the femoral canal.  I then began broaching with a size 8 broach up to a size 10.  I was pleased with the 10 filling the canal and I used a calcar planer off this.  I then trialed a standard neck and due to what I felt was going to be longer length.  We trialed the 36, -2 hip ball. We brought the leg back up.  An open retraction internal rotation reduced this in the acetabulum was stable with internal and external rotation with minimal shock.  We measured his leg lengths to be exactly equal under direct fluoroscopy, his offsets were equal as well.  We then dislocated the hip and removed the trial components.  I placed the real Corail femoral component from DePuy with standard offset, size 10 and the real 36 -2 metal hip ball.  We reduced this again into the acetabulum and it was stable.  We irrigated the tissues with normal saline solution and closed the joint capsule with interrupted #1 Ethibond suture, followed by running 0 V-Loc in suture in the tensor fascia, 0 Vicryl and 2-0 Vicryl in the deep and subcutaneous tissue and 4-0 Monocryl subcuticular stitch, Dermabond, and then a silver based dressing.  He was then taken off the Hana table awakened, extubated, taken to recovery room.  I did feel his leg lengths were equal.  Again, he had no complications noted.  All final counts were correct.     Vanita Panda. Magnus Ivan, M.D.     CYB/MEDQ  D:  11/13/2012  T:  11/14/2012  Job:  [id number]

## 2012-11-14 NOTE — Progress Notes (Signed)
Subjective: 1 Day Post-Op Procedure(s) (LRB): TOTAL HIP ARTHROPLASTY ANTERIOR APPROACH (Right) Patient reports pain as mild.  Asymptomatic acute blood loss anemia.  Objective: Vital signs in last 24 hours: Temp:  [97.7 F (36.5 C)-98.6 F (37 C)] 97.8 F (36.6 C) (02/19 0627) Pulse Rate:  [45-94] 65 (02/19 0627) Resp:  [13-21] 18 (02/19 0627) BP: (112-136)/(45-76) 130/76 mmHg (02/19 0627) SpO2:  [98 %-100 %] 99 % (02/19 0627) Weight:  [61.689 kg (136 lb)] 61.689 kg (136 lb) (02/19 0500)  Intake/Output from previous day: 02/18 0701 - 02/19 0700 In: 3430 [P.O.:480; I.V.:2700; IV Piggyback:250] Out: 1050 [Urine:700; Blood:350] Intake/Output this shift:     Recent Labs  11/14/12 0500  HGB 10.4*    Recent Labs  11/14/12 0500  WBC 7.9  RBC 3.28*  HCT 31.9*  PLT 221    Recent Labs  11/14/12 0500  NA 134*  K 4.0  CL 97  CO2 30  BUN 12  CREATININE 0.84  GLUCOSE 164*  CALCIUM 8.4   No results found for this basename: LABPT, INR,  in the last 72 hours  Sensation intact distally Intact pulses distally Dorsiflexion/Plantar flexion intact Incision: dressing C/D/I  Assessment/Plan: 1 Day Post-Op Procedure(s) (LRB): TOTAL HIP ARTHROPLASTY ANTERIOR APPROACH (Right) Up with therapy, WBAT right hip, no hip precautions  BLACKMAN,CHRISTOPHER Y 11/14/2012, 7:57 AM

## 2012-11-15 ENCOUNTER — Encounter (HOSPITAL_COMMUNITY): Payer: Self-pay | Admitting: Orthopaedic Surgery

## 2012-11-15 LAB — CBC
HCT: 33.3 % — ABNORMAL LOW (ref 39.0–52.0)
Hemoglobin: 11.1 g/dL — ABNORMAL LOW (ref 13.0–17.0)
RBC: 3.47 MIL/uL — ABNORMAL LOW (ref 4.22–5.81)
WBC: 12.1 10*3/uL — ABNORMAL HIGH (ref 4.0–10.5)

## 2012-11-15 LAB — GLUCOSE, CAPILLARY
Glucose-Capillary: 107 mg/dL — ABNORMAL HIGH (ref 70–99)
Glucose-Capillary: 121 mg/dL — ABNORMAL HIGH (ref 70–99)

## 2012-11-15 NOTE — Plan of Care (Signed)
Problem: Phase II Progression Outcomes Goal: Discharge plan established Recommend 3 in 1, tub bench and HH OT for ADL trg and ADL mobility safety trg after acute care d/c

## 2012-11-15 NOTE — Evaluation (Signed)
Occupational Therapy Evaluation Patient Details Name: Aaron Hendrix MRN: [id number] DOB: Mar 28, 1930 Today's Date: 11/15/2012 Time: 4098-1191 OT Time Calculation (min): 42 min  OT Assessment / Plan / Recommendation Clinical Impression  Pt demos decline in function with LB ADLs, strength, balance, safety and activity tolerance following R hip surgery. Pt would benefit from OT services to address these impairments to help restore PLOF to return home safely    OT Assessment  Patient needs continued OT Services    Follow Up Recommendations  Home health OT    Barriers to Discharge None    Equipment Recommendations  Tub/shower seat;3 in 1 bedside comode    Recommendations for Other Services    Frequency  Min 2X/week    Precautions / Restrictions Precautions Precautions: None Precaution Comments: per MD orders Restrictions Weight Bearing Restrictions: Yes RLE Weight Bearing: Weight bearing as tolerated   Pertinent Vitals/Pain     ADL  Grooming: Performed;Wash/dry hands;Wash/dry face;Min guard Where Assessed - Grooming: Supported standing Upper Body Bathing: Simulated;Set up;Supervision/safety Lower Body Bathing: Moderate assistance Upper Body Dressing: Performed;Supervision/safety;Set up Lower Body Dressing: Moderate assistance Toilet Transfer: Performed;Min guard Toilet Transfer Method: Sit to stand;Other (comment) (ambulating from RW level) Toilet Transfer Equipment: Raised toilet seat with arms (or 3-in-1 over toilet) Toileting - Clothing Manipulation and Hygiene: Minimal assistance Where Assessed - Toileting Clothing Manipulation and Hygiene: Standing Transfers/Ambulation Related to ADLs: pt required verbal cues to wait for ther apist before initiating sit- stand ADL Comments: pt and family educated on ADL A/E with demo    OT Diagnosis: Generalized weakness  OT Problem List: Decreased strength;Decreased knowledge of use of DME or AE;Impaired balance (sitting and/or  standing);Pain;Decreased activity tolerance OT Treatment Interventions:     OT Goals Acute Rehab OT Goals OT Goal Formulation: With patient/family Time For Goal Achievement: 11/22/12 ADL Goals Pt Will Perform Grooming: with set-up;with supervision;Supported;Standing at sink ADL Goal: Grooming - Progress: Goal set today Pt Will Perform Lower Body Bathing: with min assist;with adaptive equipment ADL Goal: Lower Body Bathing - Progress: Goal set today Pt Will Perform Lower Body Dressing: with min assist;with adaptive equipment ADL Goal: Lower Body Dressing - Progress: Goal set today Pt Will Transfer to Toilet: with supervision;with DME;Grab bars ADL Goal: Toilet Transfer - Progress: Goal set today Pt Will Perform Toileting - Clothing Manipulation: with supervision;Standing ADL Goal: Toileting - Clothing Manipulation - Progress: Goal set today Pt Will Perform Tub/Shower Transfer: with supervision;with DME ADL Goal: Tub/Shower Transfer - Progress: Goal set today  Visit Information  Last OT Received On: 11/15/12 Assistance Needed: +1    Subjective Data  Subjective: " I'm feeling alright " Patient Stated Goal: To return home   Prior Functioning     Home Living Lives With: Spouse Available Help at Discharge: Family;Available 24 hours/day Type of Home: House Home Access: Level entry Home Layout: Laundry or work area in basement;Able to live on main level with bedroom/bathroom Home Adaptive Equipment: None Prior Function Level of Independence: Independent Able to Take Stairs?: Yes Driving: Yes Vocation: Retired Musician: No difficulties Dominant Hand: Right         Vision/Perception Vision - History Baseline Vision: Wears glasses all the time Patient Visual Report: No change from baseline Vision - Assessment Eye Alignment: Within Functional Limits Perception Perception: Within Functional Limits   Cognition  Cognition Overall Cognitive Status:  Appears within functional limits for tasks assessed/performed Arousal/Alertness: Awake/alert Orientation Level: Oriented X4 / Intact Behavior During Session: [facility] for tasks performed  Extremity/Trunk Assessment Right Upper Extremity Assessment RUE ROM/Strength/Tone: [facility] for tasks assessed Left Upper Extremity Assessment LUE ROM/Strength/Tone: WFL for tasks assessed     Mobility Bed Mobility Bed Mobility: Supine to Sit;Sitting - Scoot to Edge of Bed Supine to Sit: 4: Min assist;With rails Sitting - Scoot to Edge of Bed: 6: Modified independent (Device/Increase time) Transfers Transfers: Sit to Stand;Stand to Sit Sit to Stand: 4: Min guard;Without upper extremity assist;From bed;From chair/3-in-1;From toilet Stand to Sit: 4: Min guard;Without upper extremity assist;To chair/3-in-1;To toilet Details for Transfer Assistance: vc for safe use of RW/hand placement; minguard for safety     Exercise    Balance Balance Balance Assessed: No   End of Session OT - End of Session Equipment Utilized During Treatment: Gait belt (RW, 3 in 1, ADL A/E) Activity Tolerance: Patient tolerated treatment well Patient left: in chair;with call bell/phone within reach;with family/visitor present  GO     Galen Manila 11/15/2012, 12:07 PM

## 2012-11-15 NOTE — Progress Notes (Signed)
Physical Therapy Treatment Patient Details Name: TETSUO COPPOLA MRN: [id number] DOB: 1930-08-10 Today's Date: 11/15/2012 Time: 4098-1191 PT Time Calculation (min): 25 min  PT Assessment / Plan / Recommendation Comments on Treatment Session  Patient continues to progress very well. Able to tolerate increase ambulate and stair training without difficulty. Anticipate discharge tomorrow AM    Follow Up Recommendations  Home health PT;Supervision for mobility/OOB     Does the patient have the potential to tolerate intense rehabilitation     Barriers to Discharge        Equipment Recommendations  Rolling walker with 5" wheels    Recommendations for Other Services    Frequency 7X/week   Plan Discharge plan remains appropriate;Frequency remains appropriate    Precautions / Restrictions Precautions Precautions: None Precaution Comments: per MD orders Restrictions Weight Bearing Restrictions: Yes RLE Weight Bearing: Weight bearing as tolerated   Pertinent Vitals/Pain no apparent distress     Mobility  Bed Mobility Bed Mobility: Not assessed Supine to Sit: 4: Min assist;With rails Sitting - Scoot to Edge of Bed: 6: Modified independent (Device/Increase time) Transfers Sit to Stand: 4: Min guard;Without upper extremity assist;From bed;From chair/3-in-1;From toilet Stand to Sit: 4: Min guard;Without upper extremity assist;To chair/3-in-1;To toilet Details for Transfer Assistance: Minguard for safety  Ambulation/Gait Ambulation/Gait Assistance: 5: Supervision Ambulation Distance (Feet): 400 Feet Assistive device: Rolling walker Ambulation/Gait Assistance Details: Patient with step through gait with great effiency Gait Pattern: Step-through pattern Stairs: Yes Stairs Assistance: 4: Min guard Stair Management Technique: No rails;Step to pattern;Forwards;With walker Number of Stairs: 1 (practiced twice)    Exercises Total Joint Exercises Quad Sets: AROM;Both;10 reps Heel  Slides: AAROM;Right;10 reps Hip ABduction/ADduction: AAROM;Right;10 reps Long Arc Quad: AROM;Right;10 reps   PT Diagnosis:    PT Problem List:   PT Treatment Interventions:     PT Goals Acute Rehab PT Goals PT Goal: Sit to Stand - Progress: Progressing toward goal PT Goal: Stand to Sit - Progress: Progressing toward goal PT Goal: Ambulate - Progress: Progressing toward goal Pt will Go Up / Down Stairs: 1-2 stairs;with supervision;with rolling walker PT Goal: Up/Down Stairs - Progress: Goal set today PT Goal: Perform Home Exercise Program - Progress: Progressing toward goal  Visit Information  Last PT Received On: 11/15/12 Assistance Needed: +1    Subjective Data      Cognition  Cognition Overall Cognitive Status: Appears within functional limits for tasks assessed/performed Arousal/Alertness: Awake/alert Orientation Level: Oriented X4 / Intact Behavior During Session: [facility] for tasks performed    Balance  Balance Balance Assessed: No  End of Session PT - End of Session Equipment Utilized During Treatment: Gait belt Activity Tolerance: Patient tolerated treatment well Patient left: in chair;with call bell/phone within reach Nurse Communication: Mobility status   GP     Fredrich Birks 11/15/2012, 2:25 PM

## 2012-11-15 NOTE — Progress Notes (Signed)
Physical Therapy Treatment Patient Details Name: Aaron Hendrix MRN: [id number] DOB: [birth year] Today's Date: 11/15/2012 Time: 4098-1191 PT Time Calculation (min): 25 min  PT Assessment / Plan / Recommendation Comments on Treatment Session       Follow Up Recommendations  Home health PT;Supervision for mobility/OOB     Does the patient have the potential to tolerate intense rehabilitation     Barriers to Discharge        Equipment Recommendations  Rolling walker with 5" wheels    Recommendations for Other Services    Frequency 7X/week   Plan Discharge plan remains appropriate;Frequency remains appropriate    Precautions / Restrictions Precautions Precautions: None Precaution Comments: per MD orders Restrictions Weight Bearing Restrictions: Yes RLE Weight Bearing: Weight bearing as tolerated   Pertinent Vitals/Pain no apparent distress     Mobility  Bed Mobility Bed Mobility: Not assessed Transfers Sit to Stand: 4: Min guard;From chair/3-in-1;With upper extremity assist Stand to Sit: 4: Min guard;With upper extremity assist;To chair/3-in-1 Details for Transfer Assistance: vc for safe use of RW/hand placement; minguard for safety Ambulation/Gait Ambulation/Gait Assistance: 4: Min guard Ambulation Distance (Feet): 250 Feet Assistive device: Rolling walker Ambulation/Gait Assistance Details: Cues for posture and safe use of RW. Patients quality improving Gait Pattern: Step-through pattern;Decreased stride length;Right foot flat;Left foot flat;Antalgic    Exercises Total Joint Exercises Quad Sets: AROM;Both;10 reps Heel Slides: AAROM;Right;10 reps Hip ABduction/ADduction: AAROM;Right;10 reps Long Arc Quad: AROM;Right;10 reps   PT Diagnosis:    PT Problem List:   PT Treatment Interventions:     PT Goals Acute Rehab PT Goals PT Goal: Sit to Stand - Progress: Progressing toward goal PT Goal: Stand to Sit - Progress: Progressing toward goal PT Goal: Ambulate  - Progress: Progressing toward goal PT Goal: Perform Home Exercise Program - Progress: Progressing toward goal  Visit Information  Assistance Needed: +1    Subjective Data      Cognition  Cognition Overall Cognitive Status: Appears within functional limits for tasks assessed/performed Arousal/Alertness: Awake/alert Orientation Level: Oriented X4 / Intact Behavior During Session: [facility] for tasks performed    Balance     End of Session PT - End of Session Equipment Utilized During Treatment: Gait belt Activity Tolerance: Patient tolerated treatment well Patient left: in chair;with call bell/phone within reach Nurse Communication: Mobility status   GP     Fredrich Birks 11/15/2012, 11:57 AM

## 2012-11-15 NOTE — Progress Notes (Deleted)
Physical Therapy Treatment Patient Details Name: Aaron Hendrix MRN: [id number] DOB: September 30, 1929 Today's Date: 11/15/2012 Time: 4098-1191 PT Time Calculation (min): 25 min  PT Assessment / Plan / Recommendation Comments on Treatment Session       Follow Up Recommendations        Does the patient have the potential to tolerate intense rehabilitation     Barriers to Discharge        Equipment Recommendations       Recommendations for Other Services    Frequency     Plan      Precautions / Restrictions Precautions Precautions: None Precaution Comments: per MD orders Restrictions Weight Bearing Restrictions: Yes RLE Weight Bearing: Weight bearing as tolerated   Pertinent Vitals/Pain no apparent distress     Mobility  Bed Mobility Bed Mobility: Not assessed Transfers Sit to Stand: 4: Min guard;From chair/3-in-1;With upper extremity assist Stand to Sit: 4: Min guard;With upper extremity assist;To chair/3-in-1 Details for Transfer Assistance: vc for safe use of RW/hand placement; minguard for safety Ambulation/Gait Ambulation/Gait Assistance: 4: Min guard Ambulation Distance (Feet): 250 Feet Assistive device: Rolling walker Ambulation/Gait Assistance Details: Cues for posture and safe use of RW. Patients quality improving Gait Pattern: Step-through pattern;Decreased stride length;Right foot flat;Left foot flat;Antalgic    Exercises Total Joint Exercises Quad Sets: AROM;Both;10 reps Heel Slides: AAROM;Right;10 reps Hip ABduction/ADduction: AAROM;Right;10 reps Long Arc Quad: AROM;Right;10 reps   PT Diagnosis:    PT Problem List:   PT Treatment Interventions:     PT Goals Acute Rehab PT Goals PT Goal: Sit to Stand - Progress: Progressing toward goal PT Goal: Stand to Sit - Progress: Progressing toward goal PT Goal: Ambulate - Progress: Progressing toward goal PT Goal: Perform Home Exercise Program - Progress: Progressing toward goal  Visit Information  Assistance Needed: +1    Subjective Data      Cognition  Cognition Overall Cognitive Status: Appears within functional limits for tasks assessed/performed Arousal/Alertness: Awake/alert Orientation Level: Oriented X4 / Intact Behavior During Session: [facility] for tasks performed    Balance     End of Session     GP     Fredrich Birks 11/15/2012, 11:55 AM 11/15/2012 Fredrich Birks PTA [phone] pager [phone] office

## 2012-11-15 NOTE — Progress Notes (Signed)
Subjective: 2 Days Post-Op Procedure(s) (LRB): TOTAL HIP ARTHROPLASTY ANTERIOR APPROACH (Right) Patient reports pain as mild.  Labs not back this am.  Somewhat disoriented last evening.  Objective: Vital signs in last 24 hours: Temp:  [97.8 F (36.6 C)-98.6 F (37 C)] 98.6 F (37 C) (02/19 2221) Pulse Rate:  [62-68] 62 (02/19 2221) Resp:  [16-18] 16 (02/20 0400) BP: (104-130)/(43-76) 110/54 mmHg (02/19 2221) SpO2:  [99 %-100 %] 100 % (02/19 2221)  Intake/Output from previous day: 02/19 0701 - 02/20 0700 In: 147.7 [I.V.:147.7] Out: -  Intake/Output this shift:     Recent Labs  11/14/12 0500  HGB 10.4*    Recent Labs  11/14/12 0500  WBC 7.9  RBC 3.28*  HCT 31.9*  PLT 221    Recent Labs  11/14/12 0500  NA 134*  K 4.0  CL 97  CO2 30  BUN 12  CREATININE 0.84  GLUCOSE 164*  CALCIUM 8.4   No results found for this basename: LABPT, INR,  in the last 72 hours  Sensation intact distally Intact pulses distally Dorsiflexion/Plantar flexion intact Incision: dressing C/D/I No cellulitis present  Assessment/Plan: 2 Days Post-Op Procedure(s) (LRB): TOTAL HIP ARTHROPLASTY ANTERIOR APPROACH (Right) Up with therapy  Aaron Hendrix Y 11/15/2012, 6:24 AM

## 2012-11-16 LAB — CBC
HCT: 31.5 % — ABNORMAL LOW (ref 39.0–52.0)
MCH: 31.7 pg (ref 26.0–34.0)
MCV: 96 fL (ref 78.0–100.0)
Platelets: 233 10*3/uL (ref 150–400)
RBC: 3.28 MIL/uL — ABNORMAL LOW (ref 4.22–5.81)
WBC: 12.1 10*3/uL — ABNORMAL HIGH (ref 4.0–10.5)

## 2012-11-16 LAB — GLUCOSE, CAPILLARY: Glucose-Capillary: 99 mg/dL (ref 70–99)

## 2012-11-16 MED ORDER — OXYCODONE-ACETAMINOPHEN 5-325 MG PO TABS: 1.0000 | ORAL_TABLET | ORAL | Status: DC | PRN

## 2012-11-16 MED ORDER — ASPIRIN 325 MG PO TBEC: 325.0000 mg | DELAYED_RELEASE_TABLET | Freq: Every day | ORAL | Status: DC

## 2012-11-16 NOTE — Progress Notes (Signed)
Physical Therapy Treatment Patient Details Name: Aaron Hendrix MRN: [id number] DOB: Mar 25, 1930 Today's Date: 11/16/2012 Time: 4098-1191 PT Time Calculation (min): 24 min  PT Assessment / Plan / Recommendation Comments on Treatment Session       Follow Up Recommendations  Home health PT;Supervision for mobility/OOB     Does the patient have the potential to tolerate intense rehabilitation     Barriers to Discharge        Equipment Recommendations  Rolling walker with 5" wheels    Recommendations for Other Services    Frequency 7X/week   Plan Discharge plan remains appropriate;Frequency remains appropriate    Precautions / Restrictions Precautions Precautions: None Precaution Comments: per MD orders Restrictions Weight Bearing Restrictions: Yes RLE Weight Bearing: Weight bearing as tolerated   Pertinent Vitals/Pain no apparent distress     Mobility  Bed Mobility Bed Mobility: Not assessed Transfers Sit to Stand: 6: Modified independent (Device/Increase time) Stand to Sit: 6: Modified independent (Device/Increase time) Ambulation/Gait Ambulation/Gait Assistance: 6: Modified independent (Device/Increase time) Ambulation Distance (Feet): 600 Feet Gait Pattern: Within Functional Limits Stairs Assistance: 6: Modified independent (Device/Increase time) Stair Management Technique: No rails;With walker Number of Stairs: 1    Exercises Total Joint Exercises Quad Sets: AROM;Both;10 reps Heel Slides: AAROM;Right;10 reps Hip ABduction/ADduction: AAROM;Right;10 reps   PT Diagnosis:    PT Problem List:   PT Treatment Interventions:     PT Goals Acute Rehab PT Goals PT Goal: Sit to Stand - Progress: Progressing toward goal PT Goal: Stand to Sit - Progress: Progressing toward goal PT Goal: Ambulate - Progress: Progressing toward goal PT Goal: Up/Down Stairs - Progress: Progressing toward goal PT Goal: Perform Home Exercise Program - Progress: Progressing toward  goal  Visit Information  Last PT Received On: 11/16/12 Assistance Needed: +1    Subjective Data      Cognition  Cognition Overall Cognitive Status: Appears within functional limits for tasks assessed/performed Arousal/Alertness: Awake/alert Orientation Level: Oriented X4 / Intact Behavior During Session: [facility] for tasks performed    Balance     End of Session PT - End of Session Equipment Utilized During Treatment: Gait belt Activity Tolerance: Patient tolerated treatment well Patient left: in chair;with call bell/phone within reach Nurse Communication: Mobility status   GP     Fredrich Birks 11/16/2012, 11:28 AM 11/16/2012 Fredrich Birks PTA [phone] pager [phone] office

## 2012-11-16 NOTE — Progress Notes (Signed)
CARE MANAGEMENT NOTE 11/16/2012  Patient:  Aaron Hendrix, Aaron Hendrix   Account Number:  [id number]  Date Initiated:  11/15/2012  Documentation initiated by:  Vance Peper  Subjective/Objective Assessment:   79 yr old male s/p left hip arthroplasty.     Action/Plan:   CM spoke with patient and husband concerning home health and DME needs at discharge. Choice offered. Patient states he has a rolling walker and 3in1.Wife request bath aide.   Anticipated DC Date:  11/16/2012   Anticipated DC Plan:  HOME W HOME HEALTH SERVICES      DC Planning Services  CM consult      [facility] Choice  HOME HEALTH   Choice offered to / List presented to:  C-1 Patient        HH arranged  HH-4 NURSE'S AIDE  HH-2 PT      HH agency  [facility]   Status of service:  Completed, signed off Medicare Important Message given?   (If response is "NO", the following Medicare IM given date fields will be blank) Date Medicare IM given:   Date Additional Medicare IM given:    Discharge Disposition:  HOME W HOME HEALTH SERVICES  Per UR Regulation:    If discussed at Long Length of Stay Meetings, dates discussed:    Comments:

## 2012-11-16 NOTE — Discharge Summary (Signed)
[name] MRN: [id number] DOB/AGE: 1930-05-23 79 y.o.  Admit date: 11/13/2012 Discharge date: 11/16/2012  Admission Diagnoses:  Principal Problem:   Degenerative arthritis of hip   Discharge Diagnoses:  Same  Past Medical History  Diagnosis Date  . COPD with asthma   . Diabetes mellitus     Diet control   . Hypertension   . Hyperlipidemia   . Carpal tunnel syndrome   . DJD (degenerative joint disease), cervical   . Vitamin D deficiency   . Tremor, essential   . Internal hemorrhoids   . Personal history of colonic polyps     adenomatous  . IBS (irritable bowel syndrome)   . Peptic ulcer   . Duodenal ulcer, with partial obstruction 08/10/2012  . Numbness and tingling in right hand     started 2 yeas ago    Surgeries: Procedure(s): TOTAL HIP ARTHROPLASTY ANTERIOR APPROACH on 11/13/2012   Consultants:    Discharged Condition: Improved  Hospital Course: Aaron Hendrix is an 79 y.o. male who was admitted 11/13/2012 for operative treatment ofDegenerative arthritis of hip. Patient has severe unremitting pain that affects sleep, daily activities, and work/hobbies. After pre-op clearance the patient was taken to the operating room on 11/13/2012 and underwent  Procedure(s): TOTAL HIP ARTHROPLASTY ANTERIOR APPROACH.    Patient was given perioperative antibiotics: Anti-infectives   Start     Dose/Rate Route Frequency Ordered Stop   11/13/12 2000  ceFAZolin (ANCEF) IVPB 1 g/50 mL premix     1 g 100 mL/hr over 30 Minutes Intravenous Every 6 hours 11/13/12 1741 11/14/12 0328   11/13/12 0600  ceFAZolin (ANCEF) IVPB 2 g/50 mL premix     2 g 100 mL/hr over 30 Minutes Intravenous On call to O.R. 11/12/12 1417 11/13/12 1300       Patient was given sequential compression devices, early ambulation, and chemoprophylaxis to prevent DVT.  Patient benefited maximally from hospital stay and there were no complications.    Recent vital signs: Patient Vitals for  the past 24 hrs:  BP Temp Pulse Resp SpO2  11/16/12 0646 125/60 mmHg 98.7 F (37.1 C) 95 16 98 %  11/15/12 2132 125/56 mmHg 99.1 F (37.3 C) 96 16 100 %  11/15/12 1426 121/54 mmHg 99.5 F (37.5 C) 95 17 100 %     Recent laboratory studies:  Recent Labs  11/14/12 0500 11/15/12 0617  WBC 7.9 12.1*  HGB 10.4* 11.1*  HCT 31.9* 33.3*  PLT 221 213  NA 134*  --   K 4.0  --   CL 97  --   CO2 30  --   BUN 12  --   CREATININE 0.84  --   GLUCOSE 164*  --   CALCIUM 8.4  --      Discharge Medications:     Medication List    STOP taking these medications       aspirin 81 MG tablet      TAKE these medications       acetaminophen 500 MG tablet  Commonly known as:  TYLENOL  Take 500-1,000 mg by mouth every 6 (six) hours as needed for pain. For pain.     aspirin 325 MG EC tablet  Take 1 tablet (325 mg total) by mouth daily with breakfast.     oxyCODONE-acetaminophen 5-325 MG per tablet  Commonly known as:  ROXICET  Take 1 tablet by mouth every 4 (four) hours as needed for pain.     Pancrelipase (  Lip-Prot-Amyl) 24000 UNITS Cpep  Commonly known as:  CREON  Take 1 capsule (24,000 Units total) by mouth 3 (three) times daily with meals.     pantoprazole 40 MG tablet  Commonly known as:  PROTONIX  Take 1 tablet (40 mg total) by mouth daily.     pravastatin 20 MG tablet  Commonly known as:  PRAVACHOL  Take 20 mg by mouth daily.     Vitamin D (Ergocalciferol) 50000 UNITS Caps  Commonly known as:  DRISDOL  Take 50,000 Units by mouth every 7 (seven) days. Friday.        Diagnostic Studies: Dg Hip Operative Right  11/17/2012  *RADIOLOGY REPORT*  Clinical Data: Osteoarthritis  DG OPERATIVE RIGHT HIP  Comparison: None.  Findings: Intraoperative spot images demonstrate a right total hip arthroplasty.  Anatomic alignment of the osseous and prosthetic structures.  No breakage or loosening of the hardware.  IMPRESSION: Right total hip arthroplasty anatomically aligned.   Original  Report Authenticated By: Jolaine Click, M.D.    Dg Pelvis Portable  2012/11/17  *RADIOLOGY REPORT*  Clinical Data: Postop right hip replacement  PORTABLE PELVIS  Comparison: Intraoperative views of the right hip  Findings: The femoral and acetabular components of the right total hip replacement appear  to be good position and alignment on the portable view of the pelvis.  No complicating features are seen. Air is noted in the overlying soft tissues postoperatively.  IMPRESSION: Right total hip replacement components in good position and alignment.   Original Report Authenticated By: Dwyane Dee, M.D.    Dg Hip Portable 1 View Right  2012-11-17  *RADIOLOGY REPORT*  Clinical Data: Postop right hip replacement  PORTABLE RIGHT HIP - 1 VIEW  Comparison: Intraoperative C-arm spot films  Findings: A cross-table lateral view shows the femoral stem of the right hip replacement to be in good position.  No fracture is seen.  IMPRESSION: Femoral stem of right total hip replacement in good position.  No fracture.   Original Report Authenticated By: Dwyane Dee, M.D.     Disposition: to home      Discharge Orders   Future Orders Complete By Expires     Call MD / Call 911  As directed     Comments:      If you experience chest pain or shortness of breath, CALL 911 and be transported to the hospital emergency room.  If you develope a fever above 101 F, pus (white drainage) or increased drainage or redness at the wound, or calf pain, call your surgeon's office.    Constipation Prevention  As directed     Comments:      Drink plenty of fluids.  Prune juice may be helpful.  You may use a stool softener, such as Colace (over the counter) 100 mg twice a day.  Use MiraLax (over the counter) for constipation as needed.    Diet - low sodium heart healthy  As directed     Discharge instructions  As directed     Comments:      Increase your activities as comfort allows. You can remove your dressing this coming Monday  2/24 and then get your incision wet daily in the shower. Do not pick at your incision and place a new dry dressing or large band-aids daily.    Discharge patient  As directed     Discontinue IV  As directed     Increase activity slowly as tolerated  As directed  Follow-up Information   Follow up with Kathryne Hitch, MD In 2 weeks.   Contact information:   [address] [place] [address] [place] [address] [phone]        Signed: Kathryne Hitch 11/16/2012, 6:50 AM

## 2012-11-16 NOTE — Progress Notes (Signed)
Agree with PTA.    Abygail Galeno, PT [phone]  

## 2012-11-16 NOTE — Progress Notes (Signed)
Subjective: 3 Days Post-Op Procedure(s) (LRB): TOTAL HIP ARTHROPLASTY ANTERIOR APPROACH (Right) Patient reports pain as mild.    Objective: Vital signs in last 24 hours: Temp:  [99.1 F (37.3 C)-99.5 F (37.5 C)] 99.1 F (37.3 C) (02/20 2132) Pulse Rate:  [95-96] 96 (02/20 2132) Resp:  [16-17] 16 (02/20 2132) BP: (121-125)/(54-56) 125/56 mmHg (02/20 2132) SpO2:  [100 %] 100 % (02/20 2132)  Intake/Output from previous day: 02/20 0701 - 02/21 0700 In: 650 [P.O.:650] Out: 200 [Urine:200] Intake/Output this shift: Total I/O In: 360 [P.O.:360] Out: 200 [Urine:200]   Recent Labs  11/14/12 0500 11/15/12 0617  HGB 10.4* 11.1*    Recent Labs  11/14/12 0500 11/15/12 0617  WBC 7.9 12.1*  RBC 3.28* 3.47*  HCT 31.9* 33.3*  PLT 221 213    Recent Labs  11/14/12 0500  NA 134*  K 4.0  CL 97  CO2 30  BUN 12  CREATININE 0.84  GLUCOSE 164*  CALCIUM 8.4   No results found for this basename: LABPT, INR,  in the last 72 hours  Sensation intact distally Intact pulses distally Dorsiflexion/Plantar flexion intact Incision: dressing C/D/I  Assessment/Plan: 3 Days Post-Op Procedure(s) (LRB): TOTAL HIP ARTHROPLASTY ANTERIOR APPROACH (Right) Discharge home with home health  Kathryne Hitch 11/16/2012, 6:46 AM

## 2012-11-27 ENCOUNTER — Ambulatory Visit
Admission: RE | Admit: 2012-11-27 | Discharge: 2012-11-27 | Disposition: A | Discharge status: Home / Self Care | Payer: Medicare Other | Source: Ambulatory Visit | Attending: Orthopaedic Surgery | Admitting: Orthopaedic Surgery

## 2012-11-27 ENCOUNTER — Other Ambulatory Visit: Payer: Self-pay | Admitting: Orthopaedic Surgery

## 2012-11-27 DIAGNOSIS — M79604 Pain in right leg: Secondary | ICD-10-CM

## 2012-11-27 DIAGNOSIS — R609 Edema, unspecified: Secondary | ICD-10-CM

## 2013-01-28 ENCOUNTER — Telehealth: Payer: Self-pay | Admitting: Internal Medicine

## 2013-01-28 NOTE — Telephone Encounter (Signed)
Patient given a follow up appt to see Dr. Leone Payor for 02/22/13 11:30

## 2013-02-01 ENCOUNTER — Other Ambulatory Visit: Payer: Self-pay

## 2013-02-01 DIAGNOSIS — K589 Irritable bowel syndrome without diarrhea: Secondary | ICD-10-CM

## 2013-02-01 MED ORDER — PANCRELIPASE (LIP-PROT-AMYL) 24000-76000 UNITS PO CPEP: 1.0000 | ORAL_CAPSULE | Freq: Three times a day (TID) | ORAL | Status: DC

## 2013-02-08 ENCOUNTER — Ambulatory Visit (INDEPENDENT_AMBULATORY_CARE_PROVIDER_SITE_OTHER): Payer: Medicare Other | Admitting: General Surgery

## 2013-02-08 ENCOUNTER — Encounter (INDEPENDENT_AMBULATORY_CARE_PROVIDER_SITE_OTHER): Payer: Self-pay | Admitting: General Surgery

## 2013-02-08 VITALS — BP 138/80 | HR 60 | Temp 98.0°F | Resp 18 | Ht 64.0 in | Wt 126.8 lb

## 2013-02-08 DIAGNOSIS — R109 Unspecified abdominal pain: Secondary | ICD-10-CM

## 2013-02-08 DIAGNOSIS — K8020 Calculus of gallbladder without cholecystitis without obstruction: Secondary | ICD-10-CM

## 2013-02-08 NOTE — Progress Notes (Signed)
Subjective:     [name], male   DOB: 1930-01-24, 79 y.o.   MRN: [id number]  HPI The patient is an 79 year old male who was referred secondary to what seems to be gallbladder sludge. The patient states she's had a 2 to three-month history of a 30 pound weight loss. This has been unplanned. The patient has undergone an extensive workup to include CT scan, MRI, and ultrasound. I Do not have those films here in clinic, but according to the patient stated there was sludge in the gallbladder. The patient does state he has pain in his lower abdominal area in the midline at night. He states he has a lot of flatus. His last colonoscopy was approximately 1-1/2 years ago and was normal. The patient recently had a upper GI bleed for duodenal ulcer approximately 6 months ago.  Review of Systems  Constitutional: Negative.   HENT: Negative.   Respiratory: Negative.   Cardiovascular: Negative.   Gastrointestinal: Positive for abdominal pain. Negative for nausea, diarrhea and blood in stool.  Endocrine: Negative.   Musculoskeletal: Negative.   Neurological: Negative.        Objective:   Physical Exam  Constitutional: He is oriented to person, place, and time. He appears well-developed and well-nourished.  HENT:  Head: Normocephalic and atraumatic.  Eyes: Conjunctivae and EOM are normal. Pupils are equal, round, and reactive to light.  Neck: Normal range of motion. Neck supple.  Cardiovascular: Normal rate, regular rhythm and normal heart sounds.   Pulmonary/Chest: Effort normal and breath sounds normal.  Abdominal: Soft. Bowel sounds are normal. He exhibits no mass. There is tenderness (BLQ and RUQ). There is no rebound and no guarding.  Musculoskeletal: Normal range of motion.  Neurological: He is alert and oriented to person, place, and time.       Assessment:     78 year old male with abdominal pain     Plan:     1. I discussed with the patient his wife, and his  daughter don't like to see the CT scan to evaluate further his abdominal pain. His daughter  Will drop to a centimeter  today or Monday.  2. Based on the patient's symptoms and abdominal pain possible he could have symptomatic cholelithiasis, chronic ischemia, or carcinoma. 3. After his CT scan I will call the patient and discussed her findings as well as the next plan of action.

## 2013-02-11 ENCOUNTER — Encounter (INDEPENDENT_AMBULATORY_CARE_PROVIDER_SITE_OTHER): Payer: Self-pay

## 2013-02-11 ENCOUNTER — Other Ambulatory Visit (INDEPENDENT_AMBULATORY_CARE_PROVIDER_SITE_OTHER): Payer: Self-pay | Admitting: General Surgery

## 2013-02-11 ENCOUNTER — Encounter (INDEPENDENT_AMBULATORY_CARE_PROVIDER_SITE_OTHER): Payer: Self-pay | Admitting: General Surgery

## 2013-02-11 ENCOUNTER — Telehealth (INDEPENDENT_AMBULATORY_CARE_PROVIDER_SITE_OTHER): Payer: Self-pay | Admitting: General Surgery

## 2013-02-11 NOTE — Telephone Encounter (Signed)
I talked with the pt and his wife.  I reviewed the actual films.  There is some sludge in the GB but no overt stones or sign of acute ccy.  His mesenteric vasculature seems patent, and i discussed these findings with him and his wife.  In speaking with the pt, he is still concerned about the weight-loss and the abd pain and increased flatus.  He has an appt to see Dr. Leone Payor later this month.  We will try and see if we can get that bumped up sooner.    Its possible that her may need a C-scope or another EGD to further eval his wt loss.  He was also placed on Creon by Dr. Leone Payor, which he may need to be off to see if this helps his wt loss or abd pain.  He may still need his GB out at some point in the future if there are no further explanation of his abd pain and wt loss.

## 2013-02-12 ENCOUNTER — Telehealth (INDEPENDENT_AMBULATORY_CARE_PROVIDER_SITE_OTHER): Payer: Self-pay | Admitting: General Surgery

## 2013-02-12 NOTE — Telephone Encounter (Signed)
Called to see if patient could see Dr.Guessner any sooner than appt 5/30 per AR and I spoke with Toniann Fail who stated that a nurse worked him in and that is definitely the soonest appt time that they could see him.Marland KitchenMarland Kitchen5/30@11 :30

## 2013-02-22 ENCOUNTER — Ambulatory Visit (INDEPENDENT_AMBULATORY_CARE_PROVIDER_SITE_OTHER): Payer: Medicare Other | Admitting: Internal Medicine

## 2013-02-22 ENCOUNTER — Other Ambulatory Visit: Payer: Self-pay | Admitting: Internal Medicine

## 2013-02-22 ENCOUNTER — Encounter: Payer: Self-pay | Admitting: Internal Medicine

## 2013-02-22 ENCOUNTER — Telehealth: Payer: Self-pay

## 2013-02-22 VITALS — BP 122/68 | HR 72 | Ht 64.0 in | Wt 127.6 lb

## 2013-02-22 DIAGNOSIS — R1013 Epigastric pain: Secondary | ICD-10-CM

## 2013-02-22 DIAGNOSIS — R141 Gas pain: Secondary | ICD-10-CM

## 2013-02-22 DIAGNOSIS — R634 Abnormal weight loss: Secondary | ICD-10-CM

## 2013-02-22 DIAGNOSIS — K589 Irritable bowel syndrome without diarrhea: Secondary | ICD-10-CM

## 2013-02-22 DIAGNOSIS — IMO0001 Reserved for inherently not codable concepts without codable children: Secondary | ICD-10-CM

## 2013-02-22 MED ORDER — METRONIDAZOLE 250 MG PO TABS: 250.0000 mg | ORAL_TABLET | Freq: Three times a day (TID) | ORAL | Status: DC

## 2013-02-22 NOTE — Progress Notes (Signed)
Subjective:    [name], male    DOB: 01/12/1930, 79 y.o.   MRN: [id number]  HPI Aaron Hendrix is here with his wife, he has been having upper abdominal pain, epigastric, often post-prandial. There is increased gas/flatulence. He is using Gas-Ex with some help.  He has a formed stool every day that changes into a loose stool as it progresses. No diarrhea, though.  Had successful hip replacement a few months ago.  Has had MR abdomen and a CT-angiogram that did not show any major abnormalities but did have gas and fluid filled large and small intestine. Gallbladder was dilated and had stones, he saw Dr. Derrell Lolling and it was concluded that GB not the cause of sxs.  The problems he describes are similar to previous problems. Appetite is good.  Wt Readings from Last 3 Encounters:  02/22/13 127 lb 9.6 oz (57.879 kg)  02/08/13 126 lb 12.8 oz (57.516 kg)  11/14/12 136 lb (61.689 kg)   Medications, allergies, past medical history, past surgical history, family history and social history are reviewed and updated in the EMR. Review of Systems Recovering from hip replacement, "my leg is still weak"    Objective:   Physical Exam General:  NAD Eyes:   anicteric Lungs:  clear Heart:  S1S2 no rubs, murmurs or gallops Abdomen:  soft and nontender, BS+ and increased Ext:   no edema  Data Reviewed:  As above regarding imaging  Also had elevated amylase of 312 with a normal lipase and LFTs TTG Ab neg Normal CBC     Assessment & Plan:  Loss of weight  Abdominal pain, epigastric  Gas  IBS (irritable bowel syndrome)  Elevated amylase but normal lipase  1. Trial of metronidazole 250 mg tid x 10 days 2. ? Needs higher dose of Creon 3. Almost due for a routine colonoscopy for hx adenomas - he is 47 - will hold off for now 4. Need to recheck amylase and also a lipase 5. Agree that GB does not seem to be cause of his problems...sounds like his IBS - he has had similar  periods of fluctuating weight and these sxs before 6. I told him from my perspective it was okay for him to take his 81 mg aspirin.     Medication List                      TAKE these medications       acetaminophen 500 MG tablet  Commonly known as:  TYLENOL  Take 500-1,000 mg by mouth every 6 (six) hours as needed for pain. For pain.     aspirin 81 MG tablet  Take 1 tablet (81 mg total) by mouth daily.     metroNIDAZOLE 250 MG tablet  Commonly known as:  FLAGYL  Take 1 tablet (250 mg total) by mouth 3 (three) times daily.  Started by:  Iva Boop, MD     oxyCODONE-acetaminophen 5-325 MG per tablet  Commonly known as:  ROXICET  Take 1 tablet by mouth every 4 (four) hours as needed for pain.     Pancrelipase (Lip-Prot-Amyl) 24000 UNITS Cpep  Commonly known as:  CREON  Take 1 capsule (24,000 Units total) by mouth 3 (three) times daily with meals.     pantoprazole 40 MG tablet  Commonly known as:  PROTONIX  Take 1 tablet (40 mg total) by mouth daily.     Vitamin D (Ergocalciferol) 50000 UNITS Caps  Commonly known as:  DRISDOL  Take 50,000 Units by mouth every 7 (seven) days. Friday.       CC: Julian Hy, MD, Dr. Derrell Lolling

## 2013-02-22 NOTE — Telephone Encounter (Signed)
Left message on patient's mobile # voice mail that he need labs drawn (Amylase, Lipase) .  Orders put in epic.  Left the lab hours and our # on the message.  Told him to call me back so I will know he got this message.

## 2013-02-22 NOTE — Patient Instructions (Addendum)
We have sent the following medications to your pharmacy for you to pick up at your convenience: Generic Flagyl  Follow up with [place] in a month.    I appreciate the opportunity to care for you.

## 2013-02-26 ENCOUNTER — Other Ambulatory Visit (INDEPENDENT_AMBULATORY_CARE_PROVIDER_SITE_OTHER): Payer: Medicare Other

## 2013-02-26 DIAGNOSIS — R634 Abnormal weight loss: Secondary | ICD-10-CM

## 2013-02-26 DIAGNOSIS — R1013 Epigastric pain: Secondary | ICD-10-CM

## 2013-02-26 LAB — LIPASE: Lipase: 37 U/L (ref 11.0–59.0)

## 2013-02-26 LAB — AMYLASE: Amylase: 386 U/L — ABNORMAL HIGH (ref 27–131)

## 2013-03-13 ENCOUNTER — Ambulatory Visit (AMBULATORY_SURGERY_CENTER): Payer: Medicare Other | Admitting: *Deleted

## 2013-03-13 VITALS — Ht 64.0 in | Wt 127.0 lb

## 2013-03-13 DIAGNOSIS — R634 Abnormal weight loss: Secondary | ICD-10-CM

## 2013-03-13 DIAGNOSIS — R1013 Epigastric pain: Secondary | ICD-10-CM

## 2013-03-13 NOTE — Progress Notes (Signed)
No egg or soy allergies 

## 2013-03-14 ENCOUNTER — Encounter: Payer: Self-pay | Admitting: Internal Medicine

## 2013-03-20 ENCOUNTER — Telehealth: Payer: Self-pay | Admitting: Internal Medicine

## 2013-03-20 ENCOUNTER — Ambulatory Visit (AMBULATORY_SURGERY_CENTER): Payer: Medicare Other | Admitting: Internal Medicine

## 2013-03-20 ENCOUNTER — Encounter: Payer: Self-pay | Admitting: Internal Medicine

## 2013-03-20 VITALS — BP 138/81 | HR 64 | Temp 97.5°F | Resp 17 | Ht 64.0 in | Wt 127.0 lb

## 2013-03-20 DIAGNOSIS — R634 Abnormal weight loss: Secondary | ICD-10-CM

## 2013-03-20 DIAGNOSIS — K589 Irritable bowel syndrome without diarrhea: Secondary | ICD-10-CM

## 2013-03-20 DIAGNOSIS — R1013 Epigastric pain: Secondary | ICD-10-CM

## 2013-03-20 MED ORDER — SODIUM CHLORIDE 0.9 % IV SOLN: 500.0000 mL | INTRAVENOUS | Status: DC

## 2013-03-20 MED ORDER — PANCRELIPASE (LIP-PROT-AMYL) 36000-114000 UNITS PO CPEP: 1.0000 | ORAL_CAPSULE | Freq: Three times a day (TID) | ORAL | Status: DC

## 2013-03-20 MED ORDER — PANCRELIPASE (LIP-PROT-AMYL) 24000-76000 UNITS PO CPEP: 1.0000 | ORAL_CAPSULE | Freq: Three times a day (TID) | ORAL | Status: DC

## 2013-03-20 NOTE — Patient Instructions (Addendum)
YOU HAD AN ENDOSCOPIC PROCEDURE TODAY AT THE [facility]: Refer to the procedure report that was given to you for any specific questions about what was found during the examination.  If the procedure report does not answer your questions, please call your gastroenterologist to clarify.  If you requested that your care partner not be given the details of your procedure findings, then the procedure report has been included in a sealed envelope for you to review at your convenience later.  YOU SHOULD EXPECT: Some feelings of bloating in the abdomen. Passage of more gas than usual.  Walking can help get rid of the air that was put into your GI tract during the procedure and reduce the bloating. If you had a lower endoscopy (such as a colonoscopy or flexible sigmoidoscopy) you may notice spotting of blood in your stool or on the toilet paper. If you underwent a bowel prep for your procedure, then you may not have a normal bowel movement for a few days.  DIET: Your first meal following the procedure should be a light meal and then it is ok to progress to your normal diet.  A half-sandwich or bowl of soup is an example of a good first meal.  Heavy or fried foods are harder to digest and may make you feel nauseous or bloated.  Likewise meals heavy in dairy and vegetables can cause extra gas to form and this can also increase the bloating.  Drink plenty of fluids but you should avoid alcoholic beverages for 24 hours.  ACTIVITY: Your care partner should take you home directly after the procedure.  You should plan to take it easy, moving slowly for the rest of the day.  You can resume normal activity the day after the procedure however you should NOT DRIVE or use heavy machinery for 24 hours (because of the sedation medicines used during the test).    SYMPTOMS TO REPORT IMMEDIATELY: A gastroenterologist can be reached at any hour.  During normal business hours, 8:30 AM to 5:00 PM Monday through Friday,  call [phone].  After hours and on weekends, please call the GI answering service at [phone] who will take a message and have the physician on call contact you.   Following lower endoscopy (colonoscopy or flexible sigmoidoscopy):  Excessive amounts of blood in the stool  Significant tenderness or worsening of abdominal pains  Swelling of the abdomen that is new, acute  Fever of 100F or higher  Following upper endoscopy (EGD)  Vomiting of blood or coffee ground material  New chest pain or pain under the shoulder blades  Painful or persistently difficult swallowing  New shortness of breath  Fever of 100F or higher  Black, tarry-looking stools  FOLLOW UP: If any biopsies were taken you will be contacted by phone or by letter within the next 1-3 weeks.  Call your gastroenterologist if you have not heard about the biopsies in 3 weeks.  Our staff will call the home number listed on your records the next business day following your procedure to check on you and address any questions or concerns that you may have at that time regarding the information given to you following your procedure. This is a courtesy call and so if there is no answer at the home number and we have not heard from you through the emergency physician on call, we will assume that you have returned to your regular daily activities without incident.  SIGNATURES/CONFIDENTIALITY: You and/or your care   partner have signed paperwork which will be entered into your electronic medical record.  These signatures attest to the fact that that the information above on your After Visit Summary has been reviewed and is understood.  Full responsibility of the confidentiality of this discharge information lies with you and/or your care-partner.  The esophagus, stomach and duodenum all look normal. I am not sure what has caused your recent pain and weight loss but do know you have had similar problems before. It sounds like you are  a little better and this exam was ok - all good news.  i want you to increase the Creon to 36,000 Units with meals and see if that helps. Samples provided from the office. If that is helping fill the prescription. I would like to see you in the office in about 1 month to 6 weeks (Late July or early-mid August).  Iva Boop, MD, [facility]

## 2013-03-20 NOTE — Op Note (Signed)
[facility] [address] [place], [address]   ENDOSCOPY PROCEDURE REPORT  PATIENT: Aaron Hendrix, Aaron Hendrix  MR#: [id number] BIRTHDATE: [birth year] , 82  yrs. old GENDER: Male ENDOSCOPIST: Iva Boop, MD, [facility] PROCEDURE DATE:  03/20/2013 PROCEDURE:  EGD, diagnostic ASA CLASS:     Class III INDICATIONS:  Epigastric pain.   Weight loss. MEDICATIONS: propofol (Diprivan) 100mg  IV, MAC sedation, administered by CRNA, and These medications were titrated to patient response per physician's verbal order TOPICAL ANESTHETIC: none  DESCRIPTION OF PROCEDURE: After the risks benefits and alternatives of the procedure were thoroughly explained, informed consent was obtained.  The LB [id number] endoscope was introduced through the mouth and advanced to the second portion of the duodenum. Without limitations.  The instrument was slowly withdrawn as the mucosa was fully examined.      The upper, middle and distal third of the esophagus were carefully inspected and no abnormalities were noted.  The z-line was well seen at the GEJ.  The endoscope was pushed into the fundus which was normal including a retroflexed view.  The antrum, gastric body, first and second part of the duodenum were unremarkable. Retroflexed views revealed no abnormalities.     The scope was then withdrawn from the patient and the procedure completed.  COMPLICATIONS: There were no complications. ENDOSCOPIC IMPRESSION: Normal EGD  RECOMMENDATIONS: Increase Creon to 36K U w/ meals Return to me in 4-6 weeks - call for appointment  REPEAT EXAM:  eSigned:  Iva Boop, MD, [facility] 03/20/2013 2:23 PM   FA:[id number] Evlyn Kanner, MD and The Patient

## 2013-03-20 NOTE — Telephone Encounter (Signed)
Per Aundra Millet at [facility] drug they may only dispense the whole bottle of #250 creon per the package insert b/c of moisture issues.  Per Dr. Leone Payor that is fine.  He had rx'ed # 180 per [facility].   Dr. Leone Payor gave them a sample bottle of #50 today so Aundra Millet is going to just "profile" the rx for now.

## 2013-03-20 NOTE — Progress Notes (Signed)
Patient did not experience any of the following events: a burn prior to discharge; a fall within the facility; wrong site/side/patient/procedure/implant event; or a hospital transfer or hospital admission upon discharge from the facility. (G8907) Patient did not have preoperative order for IV antibiotic SSI prophylaxis. (G8918)  

## 2013-03-21 ENCOUNTER — Telehealth: Payer: Self-pay | Admitting: *Deleted

## 2013-03-21 NOTE — Telephone Encounter (Signed)
  Follow up Call-  Call back number 03/20/2013 08/13/2012  Post procedure Call Back phone  # [phone]  Permission to leave phone message Yes Yes     Patient questions:  Do you have a fever, pain , or abdominal swelling? no Pain Score  0 *  Have you tolerated food without any problems? yes  Have you been able to return to your normal activities? yes  Do you have any questions about your discharge instructions: Diet   no Medications  no Follow up visit  no  Do you have questions or concerns about your Care? no  Actions: * If pain score is 4 or above: No action needed, pain <4.

## 2013-03-25 ENCOUNTER — Ambulatory Visit: Payer: Medicare Other | Admitting: Internal Medicine

## 2013-04-25 ENCOUNTER — Encounter: Payer: Self-pay | Admitting: Internal Medicine

## 2013-04-25 ENCOUNTER — Ambulatory Visit (INDEPENDENT_AMBULATORY_CARE_PROVIDER_SITE_OTHER): Payer: Medicare Other | Admitting: Internal Medicine

## 2013-04-25 VITALS — BP 102/50 | HR 88 | Ht 64.0 in | Wt 127.1 lb

## 2013-04-25 DIAGNOSIS — R748 Abnormal levels of other serum enzymes: Secondary | ICD-10-CM

## 2013-04-25 DIAGNOSIS — K589 Irritable bowel syndrome without diarrhea: Secondary | ICD-10-CM

## 2013-04-25 MED ORDER — SACCHAROMYCES BOULARDII 250 MG PO CAPS: 250.0000 mg | ORAL_CAPSULE | Freq: Every day | ORAL | Status: DC

## 2013-04-25 NOTE — Progress Notes (Signed)
  Subjective:    [name], male    DOB: 12/10/1929, 79 y.o.   MRN: [id number]  HPI The patient is here with his wife - following up history of weight loss, gas, bloating, abdominal pain and diarrhea. When last seen he had an EGD to be sure that duodenal ulcer had healed and no stricture. EGD was ok. Trial of increasing Creon from 24KU to 36K U made no difference. However he is improved and weight has stabilized. He is using Gas-Ex and Gaviscon prn for afternoon gas sxs with benefit. Though still with some sxs he is improved and satisfied with quality of life re: GI issues. His wife thinks he needs to drink more fluid but reports his appetite is better overall.  Medications, allergies, past medical history, past surgical history, family history and social history are reviewed and updated in the EMR.  Wt Readings from Last 3 Encounters:  04/25/13 127 lb 2 oz (57.664 kg)  03/20/13 127 lb (57.607 kg)  03/13/13 127 lb (57.607 kg)    Review of Systems "old man dribbles" - GU    Objective:   Physical Exam WDWN thin elderly man NAD    Assessment & Plan:  IBS (irritable bowel syndrome)  Elevated amylase   1. Improved overall - I think he had a flare of IBS with some mild weight loss. Could have had some mild depression and anorexia. 2. Continue current Tx, add Florastor probiotic x 1 month. 3. Isolated amylase elevation - pancreas ok on CT/MRI. Could be salivary or macroamylasemia. Can be elevated in celiac dz but celiac labs negative. Lipase normal. Would not work-up further. Pancreatic enzyme supplements were empiric Tx for IBS-like sxs. 4. See me prn and return to [name] as planned.    Medication List       This list is accurate as of: 04/25/13 12:36 PM.  Always use your most recent med list.               acetaminophen 500 MG tablet  Commonly known as:  TYLENOL  Take 500-1,000 mg by mouth every 6 (six) hours as needed for pain. For pain.     aspirin 81  MG tablet  Take 1 tablet (81 mg total) by mouth daily.     GAS-X EXTRA STRENGTH PO  Take 1 tablet by mouth as needed.     GAVISCON 80-14.2 MG Chew  Generic drug:  Alum Hydroxide-Mag Trisilicate  Chew 1 tablet by mouth as needed.     ipratropium 0.06 % nasal spray  Commonly known as:  ATROVENT  Place 2 sprays into the nose daily.     oxyCODONE-acetaminophen 5-325 MG per tablet  Commonly known as:  ROXICET  Take 1 tablet by mouth every 4 (four) hours as needed for pain.     Pancrelipase (Lip-Prot-Amyl) 24000 UNITS Cpep  Take 1 capsule by mouth 3 (three) times daily with meals.     pantoprazole 40 MG tablet  Commonly known as:  PROTONIX  Take 1 tablet (40 mg total) by mouth daily.     saccharomyces boulardii 250 MG capsule  Commonly known as:  FLORASTOR  Take 1 capsule (250 mg total) by mouth daily.     Vitamin D (Ergocalciferol) 50000 UNITS Caps  Commonly known as:  DRISDOL  Take 50,000 Units by mouth every 7 (seven) days. Friday.       I appreciate the opportunity to care for this patient. WU:[id number] Hessie Diener, MD

## 2013-04-25 NOTE — Patient Instructions (Addendum)
Today you have been given samples of Florastor to take one a day to put the good bacteria back into your colon.  Follow up as needed with [place].   I appreciate the opportunity to care for you.

## 2013-07-01 ENCOUNTER — Ambulatory Visit (HOSPITAL_COMMUNITY): Payer: Self-pay | Admitting: *Deleted

## 2013-07-01 ENCOUNTER — Ambulatory Visit (INDEPENDENT_AMBULATORY_CARE_PROVIDER_SITE_OTHER): Payer: Medicare Other | Admitting: Podiatry

## 2013-07-01 ENCOUNTER — Encounter: Payer: Self-pay | Admitting: Podiatry

## 2013-07-01 VITALS — BP 145/76 | HR 95 | Temp 97.7°F | Resp 16 | Ht 66.0 in | Wt 129.5 lb

## 2013-07-01 DIAGNOSIS — M79609 Pain in unspecified limb: Secondary | ICD-10-CM

## 2013-07-01 DIAGNOSIS — B353 Tinea pedis: Secondary | ICD-10-CM | POA: Insufficient documentation

## 2013-07-01 DIAGNOSIS — M79606 Pain in leg, unspecified: Secondary | ICD-10-CM | POA: Insufficient documentation

## 2013-07-01 NOTE — Progress Notes (Signed)
[name] presents with a chief complaint of painful toenails bilaterally. I have reviewed his past medical history medications and allergies. His pulses are strongly palpable to the bilateral lower extremity. His nails are thick yellow dystrophic clinically mycotic and painful on palpation as well as debridement.  Assessment: Pain and limp secondary to onychomycosis  Plan: Debridement of nails 1 through 5 bilaterally covered service.

## 2013-07-01 NOTE — Patient Instructions (Signed)

## 2013-08-06 ENCOUNTER — Telehealth (INDEPENDENT_AMBULATORY_CARE_PROVIDER_SITE_OTHER): Payer: Self-pay | Admitting: *Deleted

## 2013-08-06 ENCOUNTER — Ambulatory Visit (INDEPENDENT_AMBULATORY_CARE_PROVIDER_SITE_OTHER): Payer: Medicare Other | Admitting: General Surgery

## 2013-08-06 ENCOUNTER — Encounter (INDEPENDENT_AMBULATORY_CARE_PROVIDER_SITE_OTHER): Payer: Self-pay | Admitting: General Surgery

## 2013-08-06 VITALS — BP 118/72 | HR 74 | Temp 97.0°F | Resp 16 | Ht 63.0 in | Wt 124.4 lb

## 2013-08-06 DIAGNOSIS — R634 Abnormal weight loss: Secondary | ICD-10-CM

## 2013-08-06 NOTE — Telephone Encounter (Signed)
LMOM for pt to return my call.  I was calling to inform him of his appt with Dr. Leone Payor on 09/10/13 at 1:45pm.

## 2013-08-06 NOTE — Progress Notes (Signed)
Subjective:     [name], male   DOB: 1930/06/25, 79 y.o.   MRN: [id number]  HPI The patient is an 79 year old male who had seen previously secondary to weight loss, is referred by [name] .Marland Kitchen The patient has continued with weight loss and has lost 4 Hendrix since June. The patient does state he does have some right-sided abdominal pain and has a lot of colonic gas but feels that the pain is resolved  Completely after having a bowel movement.    The patient describes no signs or symptoms of pain in the right upper quadrant after meals, or high fatty foods. The patient previously had an MRI which reveals sludge within the gallbladder however no signs of cholecystitis.   The patient has seen Dr. Elberta Leatherwood and underwent an EGD.  Review of Systems  Constitutional: Negative.   HENT: Negative.   Respiratory: Negative.   Cardiovascular: Negative.   Gastrointestinal: Negative.   Neurological: Negative.   All other systems reviewed and are negative.       Objective:   Physical Exam  Constitutional: He is oriented to person, place, and time. He appears well-developed and well-nourished.  HENT:  Head: Normocephalic and atraumatic.  Eyes: Conjunctivae and EOM are normal. Pupils are equal, round, and reactive to light.  Neck: Normal range of motion. Neck supple.  Cardiovascular: Normal rate, regular rhythm and normal heart sounds.   Pulmonary/Chest: Effort normal and breath sounds normal.  Abdominal: Soft. Bowel sounds are normal.  Musculoskeletal: Normal range of motion.  Neurological: He is alert and oriented to person, place, and time.  Skin: Skin is warm and dry.       Assessment:     79 year old male with weight loss. Do not feel his weight loss is coming from his gallbladder at this time. The patient had a colonoscopy approximately a year and a half ago that was clear. I further so other reasons for his weight loss he can attempt to remove his gallbladder to see  if this helps. And agreed for the patient at this time that is likely not his gallbladder that is causing his weight loss.     Plan:     1. We will have the patient see Dr. Elberta Leatherwood  For evaluation of his weight loss and high colonic gas. 2.  we will have patient follow back up after consulting with Dr. Elberta Leatherwood

## 2013-09-10 ENCOUNTER — Encounter: Payer: Self-pay | Admitting: Internal Medicine

## 2013-09-10 ENCOUNTER — Ambulatory Visit (INDEPENDENT_AMBULATORY_CARE_PROVIDER_SITE_OTHER): Payer: Medicare Other | Admitting: Internal Medicine

## 2013-09-10 VITALS — BP 126/72 | HR 66 | Ht 64.0 in | Wt 123.0 lb

## 2013-09-10 DIAGNOSIS — K589 Irritable bowel syndrome without diarrhea: Secondary | ICD-10-CM

## 2013-09-10 DIAGNOSIS — R1033 Periumbilical pain: Secondary | ICD-10-CM

## 2013-09-10 DIAGNOSIS — R634 Abnormal weight loss: Secondary | ICD-10-CM

## 2013-09-10 NOTE — Progress Notes (Signed)
Subjective:    [name], male    DOB: [birth year], 79 y.o.   MRN: [id number]  HPI The patient is here for follow with his wife. He continues to lose weight which concerned him. His wife is concerned as well. He has upper abdominal and periumbilical pain, he definitely feels better on an empty stomach and worse when he eats. This is been a chronic recurrent problem but in the past it always seemed to improve and his weight we go back up, that hasn't happened this time. He says he definitely feels better when his stomach is empty. He'll have 1 formed are hard stool like a plug and then had several loose bowel movements after that. He does not think Creon has made a difference. He does awaken to defecate at night sometimes. He will take oxycodone for pain relief which will bother him at night sometimes also. He has had some gallbladder sludge, and a dilated gallbladder on imaging study. He saw Dr. Derrell Lolling of surgery who thought that his problems were not coming from the gallbladder. Allergies  Allergen Reactions  . Quinapril Hcl Other (See Comments)    Unknown   . Latex Rash   Outpatient Prescriptions Prior to Visit  Medication Sig Dispense Refill  . acetaminophen (TYLENOL) 500 MG tablet Take 500-1,000 mg by mouth every 6 (six) hours as needed for pain. For pain.      Marland Kitchen aspirin 81 MG tablet Take 1 tablet (81 mg total) by mouth daily.  30 tablet    . ipratropium (ATROVENT) 0.06 % nasal spray Place 2 sprays into the nose daily.      Marland Kitchen oxyCODONE-acetaminophen (ROXICET) 5-325 MG per tablet Take 1 tablet by mouth every 4 (four) hours as needed for pain.  60 tablet  0  . Pancrelipase, Lip-Prot-Amyl, 24000 UNITS CPEP Take 1 capsule by mouth 3 (three) times daily with meals.      . pantoprazole (PROTONIX) 40 MG tablet Take 1 tablet (40 mg total) by mouth daily.  30 tablet  11  . saccharomyces boulardii (FLORASTOR) 250 MG capsule Take 1 capsule (250 mg total) by mouth daily.  30  capsule  0  . Simethicone (GAS-X EXTRA STRENGTH PO) Take 1 tablet by mouth as needed.      . Vitamin D, Ergocalciferol, (DRISDOL) 50000 UNITS CAPS Take 50,000 Units by mouth every 7 (seven) days. Friday.      . traMADol (ULTRAM) 50 MG tablet       . Alum Hydroxide-Mag Trisilicate (GAVISCON) 80-14.2 MG CHEW Chew 1 tablet by mouth as needed.       No facility-administered medications prior to visit.   Past Medical History  Diagnosis Date  . COPD with asthma   . Diabetes mellitus     Diet control   . Hyperlipidemia   . Carpal tunnel syndrome   . DJD (degenerative joint disease), cervical   . Vitamin D deficiency   . Tremor, essential   . Internal hemorrhoids   . Personal history of colonic polyps     adenomatous  . IBS (irritable bowel syndrome)   . Peptic ulcer   . Duodenal ulcer, with partial obstruction 08/10/2012  . Numbness and tingling in right hand     started 2 yeas ago  . Hypertension     no medicine needed   Past Surgical History  Procedure Laterality Date  . Anal fissure repair    . Colonoscopy w/  biopsies and polypectomy  8/03, 6/05, 7/09, 9/10    internal hemorrhoids, tubular adenomas, mucosa & lymphoid nodules  . Upper gastrointestinal endoscopy  3/05, 7/09, 9/10,2013    gastritis, duodenitis  . Total hip arthroplasty Right 11/13/2012    Procedure: TOTAL HIP ARTHROPLASTY ANTERIOR APPROACH;  Surgeon: Kathryne Hitch, MD;  Location: [facility] OR;  Service: Orthopedics;  Laterality: Right;  Right total hip arthroplasty  . Ankle surgery Right     right- pins placed in   History   Social History  . Marital Status: Married    Spouse Name: N/A    Number of Children: 3  . Years of Education: N/A   Occupational History  . [employer]     [employer] admin   Social History Main Topics  . Smoking status: Former Smoker    Types: Cigarettes    Quit date: 07/02/1963  . Smokeless tobacco: Never Used  . Alcohol Use: No     Comment: STOPPED USING ALCOHOL IN 2012    . Drug Use: No  . Sexual Activity: None   Other Topics Concern  . None   Social History Narrative   Veteran [place] [employer]   Review of Systems He says he is weaker, memory loss    Objective:   Physical Exam General:  NAD elderly black man in no acute distress Eyes:   anicteric Lungs:  clear Heart:  S1S2 no rubs, murmurs or gallops Abdomen:  soft and nontender, BS+, no bruits, bowel sounds are increased Ext:   no edema    Data Reviewed:  EGD earlier this year, previous CT scan and MRI studies earlier this year. They were all unrevealing or normal. He does have scattered atherosclerotic disease in the smaller blood vessels in his abdomen but not on the main trunks supplying the mesentery. Labs earlier this year including negative anti-endomysial antibody and tissue transglutaminase antibody with a normal IgA level. Wt Readings from Last 3 Encounters:  09/10/13 123 lb (55.792 kg)  08/06/13 124 lb 6.4 oz (56.427 kg)  07/01/13 129 lb 8 oz (58.741 kg)      Assessment & Plan:   1. Loss of weight   2. Periumbilical abdominal pain   3. Irritable bowel syndrome    I appreciate the opportunity to care for this patient. CC: Julian Hy, MD

## 2013-09-10 NOTE — Assessment & Plan Note (Addendum)
Will get mesenteric doppler US look for possible mesenteric ischemia. I think this is doubtful but he has persistent problems. He is going to need reassurance. Consider an empiric trial of antibiotics for possible bacterial overgrowth as well. Note that he does have some upper abdominal pain, on the right side at times as well. I doubt that is from the gallbladder and agree with Dr. Derrell Lolling about not performing cholecystectomy.

## 2013-09-10 NOTE — Patient Instructions (Signed)
You will be contacted by [facility] - Cardiology group regarding a doppler study.   I appreciate the opportunity to care for you.

## 2013-09-10 NOTE — Assessment & Plan Note (Addendum)
Still has this, ?if other problems Will consider empiric antibiotics, possible repeating colonoscopy though he said negative random biopsies in the past he does have a history of polyps.

## 2013-09-10 NOTE — Assessment & Plan Note (Signed)
Wt Readings from Last 3 Encounters:  09/10/13 123 lb (55.792 kg)  08/06/13 124 lb 6.4 oz (56.427 kg)  07/01/13 129 lb 8 oz (58.741 kg)    I think his perception is worse than reality though he has lost weight since October. He is also having declining physical ability which is probably multifactorial. He wonders if the appetite changes and abdominal pain weight loss are all went with the feeling poorly. Await mesenteric Doppler study.

## 2013-09-12 ENCOUNTER — Emergency Department: Payer: Self-pay | Admitting: Emergency Medicine

## 2013-09-12 LAB — URINALYSIS, COMPLETE
Bilirubin,UR: NEGATIVE
Glucose,UR: NEGATIVE mg/dL (ref 0–75)
Ketone: NEGATIVE
Leukocyte Esterase: NEGATIVE
Nitrite: NEGATIVE
Ph: 6 (ref 4.5–8.0)
Protein: 100
Squamous Epithelial: 1

## 2013-09-12 LAB — COMPREHENSIVE METABOLIC PANEL
Albumin: 3.8 g/dL (ref 3.4–5.0)
Anion Gap: 4 — ABNORMAL LOW (ref 7–16)
BUN: 15 mg/dL (ref 7–18)
Bilirubin,Total: 0.3 mg/dL (ref 0.2–1.0)
Calcium, Total: 9.8 mg/dL (ref 8.5–10.1)
Co2: 33 mmol/L — ABNORMAL HIGH (ref 21–32)
Creatinine: 0.92 mg/dL (ref 0.60–1.30)
EGFR (African American): >60
EGFR (Non-African Amer.): >60
Osmolality: 270 (ref 275–301)
Potassium: 4.8 mmol/L (ref 3.5–5.1)
SGPT (ALT): 23 U/L (ref 12–78)
Sodium: 134 mmol/L — ABNORMAL LOW (ref 136–145)
Total Protein: 6.7 g/dL (ref 6.4–8.2)

## 2013-09-12 LAB — CBC
MCH: 30.7 pg (ref 26.0–34.0)
Platelet: 239 10*3/uL (ref 150–440)

## 2013-09-12 LAB — TROPONIN I: Troponin-I: <0.02 ng/mL

## 2013-09-12 LAB — LIPASE, BLOOD: Lipase: 223 U/L (ref 73–393)

## 2013-09-13 ENCOUNTER — Telehealth: Payer: Self-pay | Admitting: Internal Medicine

## 2013-09-13 NOTE — Telephone Encounter (Signed)
OK - I don't think that changes anything we are doing See if we can get a copy of CT report Did he go to the ED? Who ordered the CT?

## 2013-09-13 NOTE — Telephone Encounter (Signed)
Yes he was in the ER.   I have contacted radiology at [facility] and they are faxing the report.

## 2013-09-13 NOTE — Telephone Encounter (Signed)
FYI Patient had a CT scan and was diagnosed with gastritis at [facility] yesterday.   He is scheduled for a mesenteric artery study for 10/02/13.

## 2013-09-16 ENCOUNTER — Encounter (HOSPITAL_COMMUNITY): Payer: Medicare Other

## 2013-09-23 ENCOUNTER — Encounter: Payer: Self-pay | Admitting: Podiatry

## 2013-09-23 ENCOUNTER — Ambulatory Visit (INDEPENDENT_AMBULATORY_CARE_PROVIDER_SITE_OTHER): Payer: Medicare Other | Admitting: Podiatry

## 2013-09-23 VITALS — BP 145/80 | HR 94 | Resp 16 | Ht 65.0 in | Wt 126.0 lb

## 2013-09-23 DIAGNOSIS — B351 Tinea unguium: Secondary | ICD-10-CM

## 2013-09-23 DIAGNOSIS — M79609 Pain in unspecified limb: Secondary | ICD-10-CM

## 2013-09-23 NOTE — Progress Notes (Signed)
Aaron Hendrix presents today with a chief complaint of painful elongated toenails bilateral.  Objective: Vital signs are stable he is alert and oriented x3. Pulses are palpable bilateral. Nails are thick yellow dystrophic onychomycotic and painful palpation as well as debridement.  Assessment: Pain in limb secondary to onychomycosis bilateral.  Plan: Treatment of nails 1 through 5 bilateral is cover service secondary to pain.

## 2013-10-02 ENCOUNTER — Ambulatory Visit (HOSPITAL_COMMUNITY): Payer: Medicare Other | Attending: Internal Medicine

## 2013-10-02 ENCOUNTER — Encounter: Payer: Self-pay | Admitting: Cardiovascular Disease

## 2013-10-02 DIAGNOSIS — J4489 Other specified chronic obstructive pulmonary disease: Secondary | ICD-10-CM | POA: Insufficient documentation

## 2013-10-02 DIAGNOSIS — Z87891 Personal history of nicotine dependence: Secondary | ICD-10-CM | POA: Insufficient documentation

## 2013-10-02 DIAGNOSIS — R109 Unspecified abdominal pain: Secondary | ICD-10-CM | POA: Insufficient documentation

## 2013-10-02 DIAGNOSIS — R1033 Periumbilical pain: Secondary | ICD-10-CM

## 2013-10-02 DIAGNOSIS — J449 Chronic obstructive pulmonary disease, unspecified: Secondary | ICD-10-CM | POA: Insufficient documentation

## 2013-10-02 DIAGNOSIS — I10 Essential (primary) hypertension: Secondary | ICD-10-CM | POA: Insufficient documentation

## 2013-10-02 DIAGNOSIS — E119 Type 2 diabetes mellitus without complications: Secondary | ICD-10-CM | POA: Insufficient documentation

## 2013-10-02 DIAGNOSIS — E785 Hyperlipidemia, unspecified: Secondary | ICD-10-CM | POA: Insufficient documentation

## 2013-10-02 DIAGNOSIS — R634 Abnormal weight loss: Secondary | ICD-10-CM | POA: Insufficient documentation

## 2013-10-03 NOTE — Progress Notes (Signed)
Quick Note:  Let him know that the blood flow to the intestines looks good. Do not recommend any more testing. I see that his weight is up, when he was weighed at the podiatrist. I think his overall problem list irritable bowel syndrome that interferes with his eating at times because he feel bad when he eats. I don't think we can fix this, but if he wants to schedule a followup visit we did talk about possible medications that might improve his symptoms. Remeron is a possibility - can explain more at visit. It is a medication that stimulates appetite, reduces nausea and helps people sleep. Duloxetine is another (Cymbalta).   ______

## 2013-10-19 ENCOUNTER — Other Ambulatory Visit: Payer: Self-pay | Admitting: Internal Medicine

## 2013-10-24 ENCOUNTER — Telehealth: Payer: Self-pay | Admitting: Internal Medicine

## 2013-10-24 ENCOUNTER — Other Ambulatory Visit (INDEPENDENT_AMBULATORY_CARE_PROVIDER_SITE_OTHER): Payer: Medicare Other

## 2013-10-24 ENCOUNTER — Encounter: Payer: Self-pay | Admitting: Gastroenterology

## 2013-10-24 ENCOUNTER — Other Ambulatory Visit: Payer: Self-pay | Admitting: Gastroenterology

## 2013-10-24 ENCOUNTER — Ambulatory Visit (INDEPENDENT_AMBULATORY_CARE_PROVIDER_SITE_OTHER): Payer: Medicare Other | Admitting: Gastroenterology

## 2013-10-24 VITALS — BP 122/70 | HR 84 | Ht 64.0 in | Wt 122.0 lb

## 2013-10-24 DIAGNOSIS — R1013 Epigastric pain: Secondary | ICD-10-CM

## 2013-10-24 DIAGNOSIS — R197 Diarrhea, unspecified: Secondary | ICD-10-CM

## 2013-10-24 DIAGNOSIS — G8929 Other chronic pain: Secondary | ICD-10-CM

## 2013-10-24 DIAGNOSIS — R1033 Periumbilical pain: Secondary | ICD-10-CM

## 2013-10-24 DIAGNOSIS — K589 Irritable bowel syndrome without diarrhea: Secondary | ICD-10-CM

## 2013-10-24 LAB — IGA: IgA: 256 mg/dL (ref 68–378)

## 2013-10-24 MED ORDER — MIRTAZAPINE 15 MG PO TABS: ORAL_TABLET | ORAL | Status: DC

## 2013-10-24 NOTE — Progress Notes (Signed)
10/24/2013 Aaron Hendrix [id number] 16-Nov-1929   History of Present Illness:  This is a pleasant 80 year old male who is well known to Dr. Carlean Purl for previous complaints of abdominal pain, constipation sometimes alternating with diarrhea, poor appetite, weight loss. He has undergone extensive evaluation, which is all been unremarkable. Dr. Carlean Purl suspects that his symptoms are secondary to IBS. He has failed several treatment regimens, including recently a trial of Creon pancreatic enzymes.  Mesenteric duplex earlier this month was unremarkable. CT scan of the abdomen and pelvis with contrast at [facility] on September 12, 2013 showed numerous tiny gallstones, no signs of acute cholecystitis; extensive air throughout the nondistended bowel.  CBC, lipase, and CMP were unremarkable at that time as well.  He complains mostly of diarrhea for the past week or so.  Says that he had to get up 5 times in the night last night to move his bowels.  No blood in the stool.  Complains of pain in his upper abdomen.  He was on antibiotics for dental procedures within the past couple of months.  His wife is asking about celiac testing and if there is any certain diet that he could/should follow.        Current Medications, Allergies, Past Medical History, Past Surgical History, Family History and Social History were reviewed in [employer] record.   Physical Exam: BP 122/70  Pulse 84  Ht 5\' 4"  (1.626 m)  Wt 122 lb (55.339 kg)  BMI 20.93 kg/m2 General: Elderly black male in no acute distress Head: Normocephalic and atraumatic Eyes:  Sclerae anicteric, conjunctiva pink  Ears: Normal auditory acuity Lungs: Clear throughout to auscultation Heart: Regular rate and rhythm Abdomen: Soft, non-distended.  Mild diffuse TTP without R/R/G.  BS present. Musculoskeletal: Symmetrical with no gross deformities  Extremities: No edema  Neurological: Alert oriented x 4, grossly  non-focal Psychological:  Alert and cooperative. Normal mood and affect  Assessment and Recommendations: -Complaints of abdominal pain, constipation with alternating diarrhea (mostly diarrhea recently), and poor appetite:  Most likely IBS after negative extensive evaluation.  He has failed multiple treatment regimens. It was last mentioned by Dr. Carlean Purl to possibly undergo a trial of Remeron.  We will give him Remeron 15 mg, but he is only to take a half a tablet at bedtime to start. His wife is also questioning about a diet so we will give him the FODMAP diet to try.  Due to his recent diarrhea and fairly recent use of antibiotics, we will check a stool for C. difficile. His wife is also asking about celiac disease. I told her that previously small bowel biopsies were negative, but she would like [place] to check the blood test as well.  He has a follow-up with Dr. Carlean Purl in a couple of weeks and will keep that appointment.  *He was informed that the Remeron may make him drowsy and he is not to take the tramadol at night with the Remeron.

## 2013-10-24 NOTE — Progress Notes (Signed)
Discussed and agree.  Note he has already been screened for celiac Abs (scanned labs show this) so i will have those tests cancelleed and we will communicate to patient.  Gatha Mayer, MD, [facility]

## 2013-10-24 NOTE — Patient Instructions (Addendum)
We have sent the following medications to your pharmacy for you to pick up at your convenience:  Remeron  Your physician has requested that you go to the basement for the following lab work before leaving today:  TTG, [place] have been given a copy of a FODMAP diet to follow  Keep your scheduled follow up appointment with Dr. Carlean Purl

## 2013-10-24 NOTE — Progress Notes (Signed)
Spoke to [facility] in the lab and she will cancel the IGA and the TTG test per Dr. Celesta Aver orders.

## 2013-10-24 NOTE — Telephone Encounter (Signed)
Patient with IBS pain alternating diarrhea and constipation.  He will come in and see Alonza Bogus, PA today at 1:30

## 2013-10-25 ENCOUNTER — Other Ambulatory Visit

## 2013-10-25 DIAGNOSIS — R197 Diarrhea, unspecified: Secondary | ICD-10-CM

## 2013-10-25 LAB — TISSUE TRANSGLUTAMINASE, IGA: Tissue Transglutaminase Ab, IgA: 4.3 U/mL (ref <20)

## 2013-10-28 LAB — CLOSTRIDIUM DIFFICILE BY PCR: Toxigenic C. Difficile by PCR: NOT DETECTED

## 2013-11-12 ENCOUNTER — Ambulatory Visit: Payer: Medicare Other | Admitting: Internal Medicine

## 2013-11-26 ENCOUNTER — Ambulatory Visit (INDEPENDENT_AMBULATORY_CARE_PROVIDER_SITE_OTHER): Payer: Medicare Other | Admitting: Internal Medicine

## 2013-11-26 ENCOUNTER — Encounter: Payer: Self-pay | Admitting: Internal Medicine

## 2013-11-26 VITALS — BP 118/62 | HR 77 | Temp 98.0°F | Resp 16 | Ht 65.5 in | Wt 120.5 lb

## 2013-11-26 DIAGNOSIS — R110 Nausea: Secondary | ICD-10-CM

## 2013-11-26 DIAGNOSIS — R634 Abnormal weight loss: Secondary | ICD-10-CM

## 2013-11-26 DIAGNOSIS — I10 Essential (primary) hypertension: Secondary | ICD-10-CM

## 2013-11-26 DIAGNOSIS — E119 Type 2 diabetes mellitus without complications: Secondary | ICD-10-CM

## 2013-11-26 DIAGNOSIS — R1033 Periumbilical pain: Secondary | ICD-10-CM

## 2013-11-26 DIAGNOSIS — K589 Irritable bowel syndrome without diarrhea: Secondary | ICD-10-CM

## 2013-11-26 NOTE — Progress Notes (Signed)
Pre visit review using our clinic review tool, if applicable. No additional management support is needed unless otherwise documented below in the visit note. 

## 2013-11-30 ENCOUNTER — Encounter: Payer: Self-pay | Admitting: Internal Medicine

## 2013-11-30 NOTE — Assessment & Plan Note (Signed)
Has had extensive GI w/up.  Reports has not had colonoscopy.  W/up as outlined.  Pt and his wife request referral to GI at [facility] for a second opinion.  Will refer to Dr Evalee Mutton.

## 2013-11-30 NOTE — Progress Notes (Signed)
Subjective:    [name], male    DOB: August 18, 1930, 80 y.o.   MRN: [id number]  HPI 80 year old male with past history of chronic abdominal pain and bowel changes, hypertension, diabetes and weight loss.  He comes in today accompanied by his wife.  History obtained from both of them.  He has had an extensive GI w/up.  Seeing Dr Carlean Purl.  Has had a normal EGD 03/20/13.  Has had CT and mesenteric duplex.  (duplex negative).  He has been diagnosed with IBS.  Continues to lose weight.  Continues to have abdominal discomfort.  Decreased appetite.  States breakfast is his hungriest time.  States his will have increased pain and then will finally have a bowel movement.  Stool is solid initially and then will be loose.  After this, pain eases off some.  Wife also reports some concerns regarding her husband's memory.  She request referral back to Dr Jannifer Franklin (neurology).  Previously had diabetes.  With the weight loss, sugars are not an issue.  Breathing stable.  No chest pain or tightness.  No vomiting.     Past Medical History  Diagnosis Date  . COPD with asthma   . Diabetes mellitus     Diet control   . Hyperlipidemia   . Carpal tunnel syndrome   . DJD (degenerative joint disease), cervical   . Vitamin D deficiency   . Tremor, essential   . Internal hemorrhoids   . Personal history of colonic polyps     adenomatous  . IBS (irritable bowel syndrome)   . Peptic ulcer   . Duodenal ulcer, with partial obstruction 08/10/2012  . Numbness and tingling in right hand     started 2 yeas ago  . Hypertension     no medicine needed    Current Outpatient Prescriptions on File Prior to Visit  Medication Sig Dispense Refill  . aspirin 81 MG tablet Take 1 tablet (81 mg total) by mouth daily.  30 tablet    . ipratropium (ATROVENT) 0.06 % nasal spray Place 2 sprays into the nose daily.      . pantoprazole (PROTONIX) 40 MG tablet TAKE 1 TABLET BY MOUTH ONCE DAILY 30-60 MINUTES BEFORE MEAL.   30 tablet  5  . saccharomyces boulardii (FLORASTOR) 250 MG capsule Take 1 capsule (250 mg total) by mouth daily.  30 capsule  0  . Simethicone (GAS-X EXTRA STRENGTH PO) Take 1 tablet by mouth as needed.      . traMADol (ULTRAM) 50 MG tablet Take by mouth at bedtime as needed.      . Vitamin D, Ergocalciferol, (DRISDOL) 50000 UNITS CAPS Take 50,000 Units by mouth every 7 (seven) days. Friday.      . mirtazapine (REMERON) 15 MG tablet Take 1/2 of a tablet (7.5mg ) at bedtime  20 tablet  0   No current facility-administered medications on file prior to visit.    Review of Systems Patient denies any headache, lightheadedness or dizziness.  No sinus or allergy symptoms.  No chest pain, tightness or palpatations.  No increased shortness of breath, cough or congestion.  No nausea or vomiting.  The abdominal pain and discomfort as outlined.  Bowel change as outlined.  Weight loss.  Decreased appetite.  Wife concerned regarding memory.          Objective:   Physical Exam Filed Vitals:   11/26/13 1337  BP: 118/62  Pulse: 77  Temp: 98 F (36.7 C)  Resp: 85   80 year old male in no acute distress.  HEENT:  Nares - clear.  Oropharynx - without lesions. NECK:  Supple.  Nontender.  No audible carotid bruit.  HEART:  Appears to be regular.   LUNGS:  No crackles or wheezing audible.  Respirations even and unlabored.   RADIAL PULSE:  Equal bilaterally.  ABDOMEN:  Soft.  Minimal tenderness to palpation.  No rebound or guarding.    Bowel sounds present and normal.  No audible abdominal bruit.   EXTREMITIES:  No increased edema present.  DP pulses palpable and equal bilaterally.         Assessment & Plan:  HEALTH MAINTENANCE.  Get him back in for a physical.  Obtain records to review.    I spent 45 minutes with the patient and more than 50% of the time was spent in consultation regarding the above.

## 2013-11-30 NOTE — Assessment & Plan Note (Signed)
Persistent.  Decreased appetite.  Review w/up.  GI w/up as outlined.

## 2013-11-30 NOTE — Assessment & Plan Note (Signed)
Blood pressure controlled.  Follow.  Obtain recent lab results before ordering labs.

## 2013-11-30 NOTE — Assessment & Plan Note (Signed)
Not an issue for him now with the weight loss.  Follow.

## 2013-11-30 NOTE — Assessment & Plan Note (Signed)
Persistent abdominal pain.  Symptoms as outlined.  GI w/up as outlined.

## 2013-11-30 NOTE — Assessment & Plan Note (Addendum)
Persistent.  GI w/up as outlined.  Follow.  Obtain recent labs.  Was given a rx for remeron by GI. Has not started.  Follow

## 2013-12-10 ENCOUNTER — Telehealth: Payer: Self-pay | Admitting: Emergency Medicine

## 2013-12-10 NOTE — Telephone Encounter (Signed)
Pt wife came into the office questioning referrals to [facility] and Dr. Jannifer Franklin in [place]. No referrals in system. Please advise

## 2013-12-10 NOTE — Telephone Encounter (Signed)
Referral under way to [facility] and I have lvm for Beverlee Nims @ [facility] to return my call

## 2013-12-11 ENCOUNTER — Other Ambulatory Visit: Payer: Self-pay | Admitting: Internal Medicine

## 2013-12-11 DIAGNOSIS — R110 Nausea: Secondary | ICD-10-CM

## 2013-12-11 DIAGNOSIS — R197 Diarrhea, unspecified: Secondary | ICD-10-CM

## 2013-12-11 DIAGNOSIS — R413 Other amnesia: Secondary | ICD-10-CM

## 2013-12-11 DIAGNOSIS — R634 Abnormal weight loss: Secondary | ICD-10-CM

## 2013-12-11 NOTE — Progress Notes (Signed)
Orders placed for GI referral and neuro referral.

## 2013-12-11 NOTE — Telephone Encounter (Signed)
I have placed the orders for the referrals.  Thanks.

## 2013-12-12 ENCOUNTER — Ambulatory Visit: Payer: Medicare Other | Admitting: Internal Medicine

## 2013-12-16 ENCOUNTER — Ambulatory Visit (INDEPENDENT_AMBULATORY_CARE_PROVIDER_SITE_OTHER): Payer: Medicare Other | Admitting: Neurology

## 2013-12-16 ENCOUNTER — Encounter: Payer: Self-pay | Admitting: Neurology

## 2013-12-16 VITALS — BP 136/71 | HR 114 | Ht 65.0 in | Wt 121.0 lb

## 2013-12-16 DIAGNOSIS — G250 Essential tremor: Secondary | ICD-10-CM

## 2013-12-16 DIAGNOSIS — R413 Other amnesia: Secondary | ICD-10-CM

## 2013-12-16 DIAGNOSIS — G252 Other specified forms of tremor: Secondary | ICD-10-CM

## 2013-12-16 DIAGNOSIS — R6889 Other general symptoms and signs: Secondary | ICD-10-CM

## 2013-12-16 DIAGNOSIS — D518 Other vitamin B12 deficiency anemias: Secondary | ICD-10-CM

## 2013-12-16 HISTORY — DX: Essential tremor: G25.2

## 2013-12-16 HISTORY — DX: Essential tremor: G25.0

## 2013-12-16 HISTORY — DX: Other amnesia: R41.3

## 2013-12-16 NOTE — Progress Notes (Signed)
Reason for visit: Memory disturbance  Aaron Hendrix is a 80 y.o. male  History of present illness:  Aaron Hendrix is an 80 year old right-handed black male with a history of a benign essential tremor, seen through this office in September 2010. The patient has apparently had some changes in memory that have occurred over the last year that his wife has noted. The patient is having some problems remembering grocery lists when his wife tells him to get something at the store. The patient is not having significant issues with repeating himself. The patient has reported some problems with remembering names for people, but he indicates that this has been a lifelong problem. The patient has had some issues with paying the bills, and his wife is now helping him with this. The patient is operating a motor vehicle, but he denies any problems with directions. The patient has had some mild balance issues, no falls. The patient has some occasional urgency of the bladder, no incontinence. The patient denies any headache or focal numbness or weakness of the extremities. The patient has had a 9 year history of a very gradual, slow weight loss. The patient has abdominal pain with eating. The etiology of the pain is not clear. The patient is sent to this office for further evaluation.  Past Medical History  Diagnosis Date  . COPD with asthma   . Diabetes mellitus     Diet control   . Hyperlipidemia   . Carpal tunnel syndrome   . DJD (degenerative joint disease), cervical   . Vitamin D deficiency   . Tremor, essential   . Internal hemorrhoids   . Personal history of colonic polyps     adenomatous  . IBS (irritable bowel syndrome)   . Peptic ulcer   . Duodenal ulcer, with partial obstruction 08/10/2012  . Numbness and tingling in right hand     started 2 yeas ago  . Hypertension     no medicine needed  . Memory deficit 12/16/2013  . Essential and other specified forms of tremor 12/16/2013  .  Polyneuropathy in diabetes(357.2)     Past Surgical History  Procedure Laterality Date  . Anal fissure repair    . Colonoscopy w/ biopsies and polypectomy  8/03, 6/05, 7/09, 9/10    internal hemorrhoids, tubular adenomas, mucosa & lymphoid nodules  . Upper gastrointestinal endoscopy  3/05, 7/09, 9/10,2013    gastritis, duodenitis  . Total hip arthroplasty Right 11/13/2012    Procedure: TOTAL HIP ARTHROPLASTY ANTERIOR APPROACH;  Surgeon: Mcarthur Rossetti, MD;  Location: [facility];  Service: Orthopedics;  Laterality: Right;  Right total hip arthroplasty  . Ankle surgery Right     right- pins placed in    Family History  Problem Relation Age of Onset  . Colon cancer Neg Hx   . Esophageal cancer Neg Hx   . Rectal cancer Neg Hx   . Stomach cancer Neg Hx   . Leukemia Maternal Aunt   . Thyroid cancer Daughter 52  . Diabetes Son     Social history:  reports that he quit smoking about 50 years ago. His smoking use included Cigarettes. He smoked 0.00 packs per day. He has never used smokeless tobacco. He reports that he drinks alcohol. He reports that he does not use illicit drugs.  Medications:  Current Outpatient Prescriptions on File Prior to Visit  Medication Sig Dispense Refill  . aspirin 81 MG tablet Take 1 tablet (81 mg total) by mouth daily.  30 tablet    . ipratropium (ATROVENT) 0.06 % nasal spray Place 2 sprays into the nose daily as needed.       . pantoprazole (PROTONIX) 40 MG tablet TAKE 1 TABLET BY MOUTH ONCE DAILY 30-60 MINUTES BEFORE MEAL.  30 tablet  5  . saccharomyces boulardii (FLORASTOR) 250 MG capsule Take 1 capsule (250 mg total) by mouth daily.  30 capsule  0  . Simethicone (GAS-X EXTRA STRENGTH PO) Take 1 tablet by mouth as needed.      . traMADol (ULTRAM) 50 MG tablet Take by mouth at bedtime as needed.      . Vitamin D, Ergocalciferol, (DRISDOL) 50000 UNITS CAPS Take 50,000 Units by mouth every 7 (seven) days. Friday.       No current facility-administered  medications on file prior to visit.      Allergies  Allergen Reactions  . Quinapril Hcl Other (See Comments)    Unknown   . Latex Rash    ROS:  Out of a complete 14 system review of symptoms, the patient complains only of the following symptoms, and all other reviewed systems are negative.  Weight loss Hearing loss Diarrhea, constipation Memory loss Decreased energy  Blood pressure 136/71, pulse 114, height 5\' 5"  (1.651 m), weight 121 lb (54.885 kg).  Physical Exam  General: The patient is alert and cooperative at the time of the examination.  Eyes: Pupils are equal, round, and reactive to light. Discs are flat bilaterally.  Neck: The neck is supple, no carotid bruits are noted.  Respiratory: The respiratory examination is notable for bilateral posterior wheezes.  Cardiovascular: The cardiovascular examination reveals a regular rate and rhythm, no obvious murmurs or rubs are noted.  Skin: Extremities are with trace edema of ankles bilaterally.  Neurologic Exam  Mental status: The Mini-Mental status examination done today shows a total score 28/30.  Cranial nerves: Facial symmetry is present. There is good sensation of the face to pinprick and soft touch bilaterally. The strength of the facial muscles and the muscles to head turning and shoulder shrug are normal bilaterally. Speech is well enunciated, no aphasia or dysarthria is noted. Extraocular movements are full. Visual fields are full. The tongue is midline, and the patient has symmetric elevation of the soft palate. No obvious hearing deficits are noted.  Motor: The motor testing reveals 5 over 5 strength of all 4 extremities. Good symmetric motor tone is noted throughout.  Sensory: Sensory testing is intact to pinprick, soft touch, vibration sensation, and position sense on all 4 extremities, with the exception that position sensation is depressed in both feet. No evidence of extinction is noted.  Coordination:  Cerebellar testing reveals good finger-nose-finger and heel-to-shin bilaterally. A very minimal intention tremor is noted bilaterally with finger-nose-finger.  Gait and station: Gait is normal. Tandem gait is slightly unsteady. Romberg is negative. No drift is seen.  Reflexes: Deep tendon reflexes are symmetric, but are depressed absent bilaterally. Toes are downgoing bilaterally.   Assessment/Plan:  1. Memory disturbance  2. History of diabetes, hypertension, resolved with weight loss  3. Peripheral neuropathy on clinical examination  4. Benign essential tremor  The patient has a long duration weight loss. The patient has reported some issues with memory recently that are relatively mild. The patient still is operating a motor vehicle without difficulty. The patient will be sent for MRI evaluation of the brain to exclude cerebrovascular disease as an etiology. The patient does have risk factors for stroke. The patient will  be sent for blood work today. Medications such as Aricept or Exelon cannot be used given the history of weight loss. The patient may be a candidate for Namenda in the future. The patient will followup in 6 months. The memory issues will be followed over time.  Jill Alexanders MD 12/16/2013 7:44 PM  [facility] [address] [place], [place] [address]  Phone [phone] Fax [phone]

## 2013-12-18 ENCOUNTER — Other Ambulatory Visit (INDEPENDENT_AMBULATORY_CARE_PROVIDER_SITE_OTHER): Payer: Self-pay

## 2013-12-18 DIAGNOSIS — Z0289 Encounter for other administrative examinations: Secondary | ICD-10-CM

## 2013-12-21 ENCOUNTER — Ambulatory Visit
Admission: RE | Admit: 2013-12-21 | Discharge: 2013-12-21 | Disposition: A | Discharge status: Home / Self Care | Payer: Medicare Other | Source: Ambulatory Visit | Attending: Neurology | Admitting: Neurology

## 2013-12-21 DIAGNOSIS — R413 Other amnesia: Secondary | ICD-10-CM

## 2013-12-21 DIAGNOSIS — G250 Essential tremor: Secondary | ICD-10-CM

## 2013-12-21 DIAGNOSIS — G252 Other specified forms of tremor: Secondary | ICD-10-CM

## 2013-12-21 LAB — COPPER, SERUM: Copper: 107 ug/dL (ref 72–166)

## 2013-12-21 LAB — TSH: TSH: 1.51 u[IU]/mL (ref 0.450–4.500)

## 2013-12-21 LAB — VITAMIN B1, WHOLE BLOOD: Thiamine: 173.9 nmol/L (ref 66.5–200.0)

## 2013-12-21 LAB — VITAMIN B12: Vitamin B-12: 631 pg/mL (ref 211–946)

## 2013-12-21 LAB — RPR: RPR: NONREACTIVE

## 2013-12-23 ENCOUNTER — Encounter: Payer: Self-pay | Admitting: Podiatry

## 2013-12-23 ENCOUNTER — Telehealth: Payer: Self-pay | Admitting: Neurology

## 2013-12-23 ENCOUNTER — Ambulatory Visit (INDEPENDENT_AMBULATORY_CARE_PROVIDER_SITE_OTHER): Payer: Medicare Other | Admitting: Podiatry

## 2013-12-23 VITALS — BP 139/76 | HR 96 | Resp 18

## 2013-12-23 DIAGNOSIS — B351 Tinea unguium: Secondary | ICD-10-CM

## 2013-12-23 DIAGNOSIS — M79609 Pain in unspecified limb: Secondary | ICD-10-CM

## 2013-12-23 MED ORDER — MEMANTINE HCL 28 X 5 MG & 21 X 10 MG PO TABS: ORAL_TABLET | ORAL | Status: DC

## 2013-12-23 NOTE — Progress Notes (Signed)
Quick Note:  Shared unremarkable results per Dr Tobey Grim findings, verbalized understanding ______

## 2013-12-23 NOTE — Progress Notes (Signed)
He presents today with his wife with a chief complaint of painful elongated toenails one through 5 bilateral.  Objective: Vital signs are stable he is alert and oriented x3. Cirrhosis is noted bilateral foot. Pulses are palpable bilateral. Nails are thick yellow dystrophic with mycotic and elongated.  Assessment: Pain in limb secondary to onychomycosis 1 through 5 bilateral.  Plan: Debridement of nails 1 through 5 bilateral covered service secondary to pain.

## 2013-12-23 NOTE — Telephone Encounter (Signed)
I called patient. MRI study shows minimal small vessel disease. I will call in a prescription for Namenda. We will start him on the titration pack.   Impression   Abnormal MRI scan of the brain showing mild changes of  chronic microvascular ischemia and generalized cerebral atrophy.

## 2013-12-24 ENCOUNTER — Encounter: Payer: Self-pay | Admitting: Emergency Medicine

## 2014-02-05 ENCOUNTER — Telehealth: Payer: Self-pay | Admitting: Internal Medicine

## 2014-02-05 NOTE — Telephone Encounter (Signed)
Left vm asking for appt for pt w/ hand swelling.  Returned pt call, transferred to triage.

## 2014-02-05 NOTE — Telephone Encounter (Signed)
Scheduled appt with Raquel tomorrow @ 2:15 (71min)

## 2014-02-06 ENCOUNTER — Ambulatory Visit (INDEPENDENT_AMBULATORY_CARE_PROVIDER_SITE_OTHER): Payer: Medicare Other | Admitting: Adult Health

## 2014-02-06 ENCOUNTER — Encounter: Payer: Self-pay | Admitting: Adult Health

## 2014-02-06 VITALS — BP 142/80 | HR 81 | Temp 97.9°F | Resp 14 | Wt 128.0 lb

## 2014-02-06 DIAGNOSIS — M25449 Effusion, unspecified hand: Secondary | ICD-10-CM

## 2014-02-06 DIAGNOSIS — M25442 Effusion, left hand: Secondary | ICD-10-CM

## 2014-02-06 LAB — COMPREHENSIVE METABOLIC PANEL
ALK PHOS: 57 U/L (ref 39–117)
ALT: 17 U/L (ref 0–53)
AST: 21 U/L (ref 0–37)
Albumin: 3.6 g/dL (ref 3.5–5.2)
BILIRUBIN TOTAL: 0.6 mg/dL (ref 0.2–1.2)
BUN: 14 mg/dL (ref 6–23)
CO2: 34 mEq/L — ABNORMAL HIGH (ref 19–32)
CREATININE: 0.8 mg/dL (ref 0.4–1.5)
Calcium: 9.4 mg/dL (ref 8.4–10.5)
Chloride: 95 mEq/L — ABNORMAL LOW (ref 96–112)
GFR: 120.93 mL/min (ref >60.00)
GLUCOSE: 158 mg/dL — AB (ref 70–99)
POTASSIUM: 4.7 meq/L (ref 3.5–5.1)
Sodium: 135 mEq/L (ref 135–145)
Total Protein: 6.1 g/dL (ref 6.0–8.3)

## 2014-02-06 LAB — CBC WITH DIFFERENTIAL/PLATELET
BASOS PCT: 0.6 % (ref 0.0–3.0)
Basophils Absolute: 0 10*3/uL (ref 0.0–0.1)
EOS ABS: 0.5 10*3/uL (ref 0.0–0.7)
Eosinophils Relative: 6.8 % — ABNORMAL HIGH (ref 0.0–5.0)
HEMATOCRIT: 39.8 % (ref 39.0–52.0)
Hemoglobin: 12.8 g/dL — ABNORMAL LOW (ref 13.0–17.0)
LYMPHS ABS: 1.4 10*3/uL (ref 0.7–4.0)
Lymphocytes Relative: 17.8 % (ref 12.0–46.0)
MCHC: 32 g/dL (ref 30.0–36.0)
MCV: 96.7 fl (ref 78.0–100.0)
MONO ABS: 0.8 10*3/uL (ref 0.1–1.0)
Monocytes Relative: 10.1 % (ref 3.0–12.0)
NEUTROS ABS: 5.2 10*3/uL (ref 1.4–7.7)
NEUTROS PCT: 64.7 % (ref 43.0–77.0)
Platelets: 264 10*3/uL (ref 150.0–400.0)
RBC: 4.12 Mil/uL — AB (ref 4.22–5.81)
RDW: 13.3 % (ref 11.5–15.5)
WBC: 8 10*3/uL (ref 4.0–10.5)

## 2014-02-06 LAB — SEDIMENTATION RATE: Sed Rate: 14 mm/hr (ref 0–22)

## 2014-02-06 LAB — URIC ACID: URIC ACID, SERUM: 4.2 mg/dL (ref 4.0–7.8)

## 2014-02-06 LAB — C-REACTIVE PROTEIN: CRP: 0.7 mg/dL (ref 0.5–20.0)

## 2014-02-06 NOTE — Progress Notes (Signed)
Pre visit review using our clinic review tool, if applicable. No additional management support is needed unless otherwise documented below in the visit note. 

## 2014-02-06 NOTE — Progress Notes (Signed)
[name], male   DOB: 1930/08/02, 80 y.o.   MRN: [id number]    Subjective:    [name], male    DOB: Mar 28, 1930, 80 y.o.   MRN: [id number]  HPI  Patient is a very pleasant 80 year old male who presents to clinic with swelling of the left hand. His wife has accompanied him today. They report that he went to urgent care because he was experiencing discomfort of the left hand. Suspected that patient might have been slightly dehydrated. Patient's wife reports he was instructed to increase his fluid intake and he was also started on Mobic 7.5 mg daily. At the time of the visit to urgent care, patient reports that he did not have swelling of his hand. This began a couple of days ago. He reports tenderness in his joints, difficulty making a fist and discomfort overall in his left hand. He denies insect bites, fever or chills. Denies injury. Overall he is feeling well. Wife reports that no labs were drawn during visit at urgent care.   Past Medical History  Diagnosis Date  . COPD with asthma   . Diabetes mellitus     Diet control   . Hyperlipidemia   . Carpal tunnel syndrome   . DJD (degenerative joint disease), cervical   . Vitamin D deficiency   . Tremor, essential   . Internal hemorrhoids   . Personal history of colonic polyps     adenomatous  . IBS (irritable bowel syndrome)   . Peptic ulcer   . Duodenal ulcer, with partial obstruction 08/10/2012  . Numbness and tingling in right hand     started 2 yeas ago  . Hypertension     no medicine needed  . Memory deficit 12/16/2013  . Essential and other specified forms of tremor 12/16/2013  . Polyneuropathy in diabetes(357.2)     Current Outpatient Prescriptions on File Prior to Visit  Medication Sig Dispense Refill  . aspirin 81 MG tablet Take 1 tablet (81 mg total) by mouth daily.  30 tablet    . ipratropium (ATROVENT) 0.06 % nasal spray Place 2 sprays into the nose daily as needed.       .  memantine (NAMENDA TITRATION PAK) tablet pack 5 mg/day for =1 week; 5 mg twice daily for =1 week; 15 mg/day given in 5 mg and 10 mg separated doses for =1 week; then 10 mg twice daily  49 tablet  0  . mirtazapine (REMERON) 15 MG tablet Take 15 mg by mouth at bedtime.      . pantoprazole (PROTONIX) 40 MG tablet TAKE 1 TABLET BY MOUTH ONCE DAILY 30-60 MINUTES BEFORE MEAL.  30 tablet  5  . saccharomyces boulardii (FLORASTOR) 250 MG capsule Take 1 capsule (250 mg total) by mouth daily.  30 capsule  0  . Simethicone (GAS-X EXTRA STRENGTH PO) Take 1 tablet by mouth as needed.      . traMADol (ULTRAM) 50 MG tablet Take by mouth at bedtime as needed.      . Vitamin D, Ergocalciferol, (DRISDOL) 50000 UNITS CAPS Take 50,000 Units by mouth every 7 (seven) days. Friday.       No current facility-administered medications on file prior to visit.     Review of Systems  Constitutional: Negative for fever.  Respiratory: Negative.   Cardiovascular: Negative.   Musculoskeletal: Positive for joint swelling (left hand joints painful).  Skin: Negative for color change and rash.  All other systems reviewed and  are negative.      Objective:  BP 142/80  Pulse 81  Temp(Src) 97.9 F (36.6 C) (Oral)  Resp 14  Wt 128 lb (58.06 kg)  SpO2 98%   Physical Exam  Constitutional: No distress.  Pleasant 81 y/o gentleman  Cardiovascular: Normal rate, regular rhythm and normal heart sounds.  Exam reveals no gallop.   No murmur heard. Pulmonary/Chest: Effort normal and breath sounds normal. No respiratory distress. He has no wheezes. He has no rales.  Musculoskeletal: He exhibits edema (left hand; joint tenderness).  Neurological: He is alert.  Skin: Skin is warm and dry. No rash noted. No erythema.  Skin is intact. No signs of insect bites, lacerations, wounds. Skin taut from edema  Psychiatric: He has a normal mood and affect. His behavior is normal. Judgment and thought content normal.      Assessment & Plan:    1. Swelling of joint, hand, left Painful left hand - gout vs arthritis vs infection such as cellulitis. Mobic may cause edema; however, he only has edema in the left hand. Check labs. Advised patient and his wife that I would call them with results and any further instruction. Pt is not in any distress. As mentioned, no edema elsewhere.   - CBC with Differential - Comprehensive metabolic panel - C-reactive protein - Sedimentation rate - Uric acid

## 2014-02-11 ENCOUNTER — Ambulatory Visit (INDEPENDENT_AMBULATORY_CARE_PROVIDER_SITE_OTHER): Payer: Medicare Other | Admitting: Internal Medicine

## 2014-02-11 ENCOUNTER — Encounter: Payer: Self-pay | Admitting: Internal Medicine

## 2014-02-11 ENCOUNTER — Ambulatory Visit (INDEPENDENT_AMBULATORY_CARE_PROVIDER_SITE_OTHER)
Admission: RE | Admit: 2014-02-11 | Discharge: 2014-02-11 | Disposition: A | Discharge status: Home / Self Care | Payer: Medicare Other | Source: Ambulatory Visit | Attending: Internal Medicine | Admitting: Internal Medicine

## 2014-02-11 VITALS — BP 120/60 | HR 78 | Temp 98.3°F | Ht 65.0 in | Wt 128.0 lb

## 2014-02-11 DIAGNOSIS — M79609 Pain in unspecified limb: Secondary | ICD-10-CM

## 2014-02-11 DIAGNOSIS — E119 Type 2 diabetes mellitus without complications: Secondary | ICD-10-CM

## 2014-02-11 DIAGNOSIS — M7989 Other specified soft tissue disorders: Secondary | ICD-10-CM

## 2014-02-11 DIAGNOSIS — M79642 Pain in left hand: Secondary | ICD-10-CM

## 2014-02-11 DIAGNOSIS — I10 Essential (primary) hypertension: Secondary | ICD-10-CM

## 2014-02-11 MED ORDER — PREDNISONE 5 MG PO TABS: ORAL_TABLET | ORAL | Status: DC

## 2014-02-11 NOTE — Progress Notes (Signed)
Pre visit review using our clinic review tool, if applicable. No additional management support is needed unless otherwise documented below in the visit note. 

## 2014-02-12 ENCOUNTER — Encounter: Payer: Self-pay | Admitting: Internal Medicine

## 2014-02-12 NOTE — Progress Notes (Signed)
Subjective:    [name], male    DOB: 11-27-1929, 79 y.o.   MRN: [id number]  HPI 80 year old male with past history of chronic abdominal pain and bowel changes, hypertension, diabetes and weight loss.  He comes in today as a work in with concerns regarding persistent left hand swelling.  He is accompanied by his wife.  History obtained from both of them.  He reports noticing bilateral hand stiffness approximately three weeks ago.  Went to Urgent Care with concerns regarding dehydration.  Was placed on Mobic.  A few days later noticed some left hand swelling.  Saw Raquel Rey.  See her note for details.  Labs obtained.  ESR and uric acid wnl.  He comes in today with persistent swelling.  Stiffness.  Increased pain isolated to base of left index finger.  Denies any injury or trauma.  No other joint pains.     Past Medical History  Diagnosis Date  . COPD with asthma   . Diabetes mellitus     Diet control   . Hyperlipidemia   . Carpal tunnel syndrome   . DJD (degenerative joint disease), cervical   . Vitamin D deficiency   . Tremor, essential   . Internal hemorrhoids   . Personal history of colonic polyps     adenomatous  . IBS (irritable bowel syndrome)   . Peptic ulcer   . Duodenal ulcer, with partial obstruction 08/10/2012  . Numbness and tingling in right hand     started 2 yeas ago  . Hypertension     no medicine needed  . Memory deficit 12/16/2013  . Essential and other specified forms of tremor 12/16/2013  . Polyneuropathy in diabetes(357.2)     Current Outpatient Prescriptions on File Prior to Visit  Medication Sig Dispense Refill  . amoxicillin (AMOXIL) 500 MG tablet Take 500 mg by mouth as needed. PRN for dental procedures      . aspirin 81 MG tablet Take 1 tablet (81 mg total) by mouth daily.  30 tablet    . ipratropium (ATROVENT) 0.06 % nasal spray Place 2 sprays into the nose daily as needed.       . meloxicam (MOBIC) 7.5 MG tablet Take 7.5 mg by  mouth daily.      . pantoprazole (PROTONIX) 40 MG tablet TAKE 1 TABLET BY MOUTH ONCE DAILY 30-60 MINUTES BEFORE MEAL.  30 tablet  5  . saccharomyces boulardii (FLORASTOR) 250 MG capsule Take 1 capsule (250 mg total) by mouth daily.  30 capsule  0  . Simethicone (GAS-X EXTRA STRENGTH PO) Take 1 tablet by mouth as needed.      . Vitamin D, Ergocalciferol, (DRISDOL) 50000 UNITS CAPS Take 50,000 Units by mouth every 7 (seven) days. Friday.      . mirtazapine (REMERON) 15 MG tablet Take 15 mg by mouth at bedtime.      . traMADol (ULTRAM) 50 MG tablet Take by mouth at bedtime as needed.       No current facility-administered medications on file prior to visit.    Review of Systems Describes hand pain and swelling as outlined.  No increased warmth.  Increased stiffness.  Hard to make a fist.  No known injury or trauma.  Off mobic.  Is eating.  Still with some decreased appetite.           Objective:   Physical Exam  Filed Vitals:   02/11/14 1414  BP: 120/60  Pulse:  78  Temp: 98.3 F (29.75 C)   80 year old male in no acute distress.  NECK:  Supple.  Nontender.   HEART:  Appears to be regular.   LUNGS:  No crackles or wheezing audible.  Respirations even and unlabored.   MSK:  Increased soft tissue swelling - left hand.  Increased base of left index finger.  No increased warmth.  Increased pain to palpation - more localized base of left index finger.        Assessment & Plan:  HEALTH MAINTENANCE.  Get him back in for a physical.  Obtain records to review.

## 2014-02-12 NOTE — Assessment & Plan Note (Signed)
Has not been an issue for him with the weight loss.  Follow.  Will place on short prednisone taper.  Follow.

## 2014-02-12 NOTE — Assessment & Plan Note (Signed)
Persistent left hand pain and swelling.  Pain more localized base of left index finger.  Increased stiffness bilateral hands.  Will treat with short prednisone taper starting at 30mg  and tapering over 6 days.  Will check xray.  Also, will refer to Dr Jefm Bryant for further evaluation and treatment.

## 2014-02-12 NOTE — Assessment & Plan Note (Signed)
Blood pressure controlled.  Follow.

## 2014-02-19 DIAGNOSIS — E119 Type 2 diabetes mellitus without complications: Secondary | ICD-10-CM | POA: Insufficient documentation

## 2014-02-19 DIAGNOSIS — R198 Other specified symptoms and signs involving the digestive system and abdomen: Secondary | ICD-10-CM | POA: Insufficient documentation

## 2014-02-19 DIAGNOSIS — R143 Flatulence: Secondary | ICD-10-CM | POA: Insufficient documentation

## 2014-02-19 DIAGNOSIS — K6289 Other specified diseases of anus and rectum: Secondary | ICD-10-CM | POA: Insufficient documentation

## 2014-02-20 DIAGNOSIS — Z87898 Personal history of other specified conditions: Secondary | ICD-10-CM | POA: Insufficient documentation

## 2014-03-31 ENCOUNTER — Ambulatory Visit: Payer: Medicare Other | Admitting: Podiatry

## 2014-04-13 ENCOUNTER — Telehealth: Payer: Self-pay | Admitting: Internal Medicine

## 2014-04-13 NOTE — Telephone Encounter (Signed)
Pt needs a physical scheduled.  First available.  Thanks.  Dr Nicki Reaper

## 2014-04-30 ENCOUNTER — Ambulatory Visit: Payer: Medicare Other | Admitting: Podiatry

## 2014-05-22 ENCOUNTER — Ambulatory Visit (INDEPENDENT_AMBULATORY_CARE_PROVIDER_SITE_OTHER): Payer: Medicare Other | Admitting: Podiatry

## 2014-05-22 ENCOUNTER — Encounter: Payer: Self-pay | Admitting: Podiatry

## 2014-05-22 VITALS — BP 142/88 | HR 84 | Resp 16 | Wt 126.0 lb

## 2014-05-22 DIAGNOSIS — M79609 Pain in unspecified limb: Secondary | ICD-10-CM

## 2014-05-22 DIAGNOSIS — M79676 Pain in unspecified toe(s): Secondary | ICD-10-CM

## 2014-05-22 DIAGNOSIS — B351 Tinea unguium: Secondary | ICD-10-CM

## 2014-05-22 NOTE — Progress Notes (Signed)
He presents today with a chief complaint of painful elongated toenails.  Objective: Nails are thick yellow dystrophic onychomycotic pulses are palpable bilateral.  Assessment: Pain in limb secondary to onychomycosis 1 through 5 bilateral.  Plan: Debridement of nails 1 through 5 bilateral.

## 2014-06-01 ENCOUNTER — Inpatient Hospital Stay: Payer: Self-pay | Admitting: General Surgery

## 2014-06-01 ENCOUNTER — Emergency Department: Payer: Self-pay | Admitting: Emergency Medicine

## 2014-06-01 DIAGNOSIS — K315 Obstruction of duodenum: Secondary | ICD-10-CM

## 2014-06-01 LAB — COMPREHENSIVE METABOLIC PANEL
ALBUMIN: 3.8 g/dL (ref 3.4–5.0)
ALBUMIN: 4.1 g/dL (ref 3.4–5.0)
ALK PHOS: 69 U/L
ALT: 24 U/L
ANION GAP: 11 (ref 7–16)
AST: 43 U/L — AB (ref 15–37)
Alkaline Phosphatase: 82 U/L
Anion Gap: 8 (ref 7–16)
BILIRUBIN TOTAL: 0.7 mg/dL (ref 0.2–1.0)
BUN: 16 mg/dL (ref 7–18)
BUN: 21 mg/dL — ABNORMAL HIGH (ref 7–18)
Bilirubin,Total: 0.4 mg/dL (ref 0.2–1.0)
CALCIUM: 9.5 mg/dL (ref 8.5–10.1)
CALCIUM: 10.7 mg/dL — AB (ref 8.5–10.1)
CHLORIDE: 90 mmol/L — AB (ref 98–107)
Chloride: 86 mmol/L — ABNORMAL LOW (ref 98–107)
Co2: 36 mmol/L — ABNORMAL HIGH (ref 21–32)
Co2: 31 mmol/L (ref 21–32)
Creatinine: 1.02 mg/dL (ref 0.60–1.30)
Creatinine: 1.47 mg/dL — ABNORMAL HIGH (ref 0.60–1.30)
EGFR (African American): 51 — ABNORMAL LOW
EGFR (Non-African Amer.): 44 — ABNORMAL LOW
GLUCOSE: 200 mg/dL — AB (ref 65–99)
Glucose: 159 mg/dL — ABNORMAL HIGH (ref 65–99)
OSMOLALITY: 273 (ref 275–301)
Osmolality: 266 (ref 275–301)
POTASSIUM: 4.6 mmol/L (ref 3.5–5.1)
Potassium: 4.2 mmol/L (ref 3.5–5.1)
SGOT(AST): 22 U/L (ref 15–37)
SGPT (ALT): 21 U/L
SODIUM: 128 mmol/L — AB (ref 136–145)
Sodium: 134 mmol/L — ABNORMAL LOW (ref 136–145)
TOTAL PROTEIN: 7.3 g/dL (ref 6.4–8.2)
Total Protein: 8.1 g/dL (ref 6.4–8.2)

## 2014-06-01 LAB — CBC WITH DIFFERENTIAL/PLATELET
BASOS ABS: 0.1 10*3/uL (ref 0.0–0.1)
BASOS ABS: 0 10*3/uL (ref 0.0–0.1)
BASOS PCT: 0.6 %
Basophil %: 0.2 %
EOS ABS: 0 10*3/uL (ref 0.0–0.7)
EOS PCT: 0 %
Eosinophil #: 0.1 10*3/uL (ref 0.0–0.7)
Eosinophil %: 0.6 %
HCT: 45 % (ref 40.0–52.0)
HCT: 48.1 % (ref 40.0–52.0)
HGB: 14.6 g/dL (ref 13.0–18.0)
HGB: 15.3 g/dL (ref 13.0–18.0)
Lymphocyte #: 0.7 10*3/uL — ABNORMAL LOW (ref 1.0–3.6)
Lymphocyte #: 0.4 10*3/uL — ABNORMAL LOW (ref 1.0–3.6)
Lymphocyte %: 5 %
Lymphocyte %: 2.4 %
MCH: 31.1 pg (ref 26.0–34.0)
MCH: 30.8 pg (ref 26.0–34.0)
MCHC: 32.5 g/dL (ref 32.0–36.0)
MCHC: 31.9 g/dL — AB (ref 32.0–36.0)
MCV: 96 fL (ref 80–100)
MCV: 97 fL (ref 80–100)
MONO ABS: 0.9 x10 3/mm (ref 0.2–1.0)
Monocyte #: 0.7 x10 3/mm (ref 0.2–1.0)
Monocyte %: 5.4 %
Monocyte %: 4.8 %
NEUTROS ABS: 16.3 10*3/uL — AB (ref 1.4–6.5)
NEUTROS PCT: 88.4 %
NEUTROS PCT: 92.6 %
Neutrophil #: 12 10*3/uL — ABNORMAL HIGH (ref 1.4–6.5)
Platelet: 242 10*3/uL (ref 150–440)
Platelet: 277 10*3/uL (ref 150–440)
RBC: 4.71 10*6/uL (ref 4.40–5.90)
RBC: 4.98 10*6/uL (ref 4.40–5.90)
RDW: 13.1 % (ref 11.5–14.5)
RDW: 13.1 % (ref 11.5–14.5)
WBC: 13.6 10*3/uL — AB (ref 3.8–10.6)
WBC: 17.6 10*3/uL — AB (ref 3.8–10.6)

## 2014-06-01 LAB — URINALYSIS, COMPLETE
BLOOD: NEGATIVE
Bilirubin,UR: NEGATIVE
Glucose,UR: NEGATIVE mg/dL (ref 0–75)
KETONE: NEGATIVE
LEUKOCYTE ESTERASE: NEGATIVE
Nitrite: NEGATIVE
Ph: 8 (ref 4.5–8.0)
Protein: 30
RBC,UR: 3 /HPF (ref 0–5)
SPECIFIC GRAVITY: 1.015 (ref 1.003–1.030)
Squamous Epithelial: NONE SEEN

## 2014-06-01 LAB — TROPONIN I: Troponin-I: <0.02 ng/mL

## 2014-06-01 LAB — LIPASE, BLOOD
Lipase: 125 U/L (ref 73–393)
Lipase: 87 U/L (ref 73–393)

## 2014-06-02 LAB — CBC WITH DIFFERENTIAL/PLATELET
Basophil #: 0 10*3/uL (ref 0.0–0.1)
Basophil %: 0.1 %
Eosinophil #: 0 10*3/uL (ref 0.0–0.7)
Eosinophil %: 0.2 %
HCT: 43.2 % (ref 40.0–52.0)
HGB: 14.1 g/dL (ref 13.0–18.0)
Lymphocyte #: 0.6 10*3/uL — ABNORMAL LOW (ref 1.0–3.6)
Lymphocyte %: 3.1 %
MCH: 30.9 pg (ref 26.0–34.0)
MCHC: 32.6 g/dL (ref 32.0–36.0)
MCV: 95 fL (ref 80–100)
Monocyte #: 1.7 x10 3/mm — ABNORMAL HIGH (ref 0.2–1.0)
Monocyte %: 9.1 %
NEUTROS ABS: 16 10*3/uL — AB (ref 1.4–6.5)
NEUTROS PCT: 87.5 %
Platelet: 259 10*3/uL (ref 150–440)
RBC: 4.55 10*6/uL (ref 4.40–5.90)
RDW: 13.1 % (ref 11.5–14.5)
WBC: 18.3 10*3/uL — AB (ref 3.8–10.6)

## 2014-06-03 ENCOUNTER — Encounter: Payer: Self-pay | Admitting: General Surgery

## 2014-06-03 LAB — CBC WITH DIFFERENTIAL/PLATELET
BASOS ABS: 0 10*3/uL (ref 0.0–0.1)
Basophil %: 0.2 %
Eosinophil #: 0 10*3/uL (ref 0.0–0.7)
Eosinophil %: 0 %
HCT: 42.2 % (ref 40.0–52.0)
HGB: 13.5 g/dL (ref 13.0–18.0)
LYMPHS ABS: 0.6 10*3/uL — AB (ref 1.0–3.6)
LYMPHS PCT: 4.3 %
MCH: 30.9 pg (ref 26.0–34.0)
MCHC: 31.9 g/dL — ABNORMAL LOW (ref 32.0–36.0)
MCV: 97 fL (ref 80–100)
Monocyte #: 1.7 x10 3/mm — ABNORMAL HIGH (ref 0.2–1.0)
Monocyte %: 13.3 %
Neutrophil #: 10.5 10*3/uL — ABNORMAL HIGH (ref 1.4–6.5)
Neutrophil %: 82.2 %
Platelet: 228 10*3/uL (ref 150–440)
RBC: 4.35 10*6/uL — ABNORMAL LOW (ref 4.40–5.90)
RDW: 13.4 % (ref 11.5–14.5)
WBC: 12.8 10*3/uL — ABNORMAL HIGH (ref 3.8–10.6)

## 2014-06-03 LAB — BASIC METABOLIC PANEL
ANION GAP: 6 — AB (ref 7–16)
BUN: 29 mg/dL — AB (ref 7–18)
CHLORIDE: 96 mmol/L — AB (ref 98–107)
CO2: 30 mmol/L (ref 21–32)
CREATININE: 0.98 mg/dL (ref 0.60–1.30)
Calcium, Total: 8.7 mg/dL (ref 8.5–10.1)
EGFR (African American): >60
Glucose: 87 mg/dL (ref 65–99)
OSMOLALITY: 270 (ref 275–301)
Potassium: 4.6 mmol/L (ref 3.5–5.1)
Sodium: 132 mmol/L — ABNORMAL LOW (ref 136–145)

## 2014-06-04 ENCOUNTER — Encounter: Payer: Self-pay | Admitting: General Surgery

## 2014-06-09 ENCOUNTER — Other Ambulatory Visit: Payer: Self-pay | Admitting: Internal Medicine

## 2014-06-18 ENCOUNTER — Ambulatory Visit: Payer: Medicare Other | Admitting: Neurology

## 2014-06-19 ENCOUNTER — Ambulatory Visit (INDEPENDENT_AMBULATORY_CARE_PROVIDER_SITE_OTHER): Payer: Medicare Other | Admitting: Internal Medicine

## 2014-06-19 ENCOUNTER — Encounter: Payer: Self-pay | Admitting: Internal Medicine

## 2014-06-19 VITALS — BP 130/70 | HR 99 | Temp 98.3°F | Ht 65.0 in | Wt 123.2 lb

## 2014-06-19 DIAGNOSIS — I10 Essential (primary) hypertension: Secondary | ICD-10-CM

## 2014-06-19 DIAGNOSIS — M7989 Other specified soft tissue disorders: Secondary | ICD-10-CM

## 2014-06-19 DIAGNOSIS — R1084 Generalized abdominal pain: Secondary | ICD-10-CM

## 2014-06-19 DIAGNOSIS — R634 Abnormal weight loss: Secondary | ICD-10-CM

## 2014-06-19 DIAGNOSIS — E119 Type 2 diabetes mellitus without complications: Secondary | ICD-10-CM

## 2014-06-19 DIAGNOSIS — M65319 Trigger thumb, unspecified thumb: Secondary | ICD-10-CM

## 2014-06-19 DIAGNOSIS — M6530 Trigger finger, unspecified finger: Secondary | ICD-10-CM

## 2014-06-19 NOTE — Progress Notes (Signed)
Pre visit review using our clinic review tool, if applicable. No additional management support is needed unless otherwise documented below in the visit note. 

## 2014-06-22 ENCOUNTER — Encounter: Payer: Self-pay | Admitting: Internal Medicine

## 2014-06-22 DIAGNOSIS — R109 Unspecified abdominal pain: Secondary | ICD-10-CM | POA: Insufficient documentation

## 2014-06-22 DIAGNOSIS — M65319 Trigger thumb, unspecified thumb: Secondary | ICD-10-CM | POA: Insufficient documentation

## 2014-06-22 NOTE — Assessment & Plan Note (Signed)
GI w/up as outlined.  Follow.  Eating better.  Appetite better.  Follow.

## 2014-06-22 NOTE — Assessment & Plan Note (Signed)
Is s/p injection.  Saw Dr Jefm Bryant.  Some better.  Follow.

## 2014-06-22 NOTE — Assessment & Plan Note (Signed)
Admitted with abdominal pain.  Concern over bowel obstruction.  See records.  Saw Dr Tamala Julian and Dr Rayann Heman.  Recommend a f/u outpt colonoscopy.  Due to f/u with Dr Redmond Pulling in 12/15.  Eating better.  Appetite better.  Bowels better.  Follow.

## 2014-06-22 NOTE — Assessment & Plan Note (Signed)
Resolved.  Hand doing better.

## 2014-06-22 NOTE — Assessment & Plan Note (Signed)
Has not been an issue for him with the weight loss.  Follow.

## 2014-06-22 NOTE — Progress Notes (Signed)
Subjective:    [name], male    DOB: Sep 07, 1930, 80 y.o.   MRN: [id number]  HPI 80 year old male with past history of chronic abdominal pain and bowel changes, hypertension, diabetes and weight loss.  He comes in today for a scheduled follow up/hospital follow up and his complete physical exam.   He is accompanied by his wife.  History obtained from both of them.  He was admitted 06/02/14 to [facility] with diffuse abdominal pain.  CT revealed a large amount of fluid and some air in the stomach.  Some distention of small bowels and transverse colon.  Concern over possible large bowel obstruction.  Started on IV fluids and nasogastric suction.  Was seen by GI.  Symptoms improved.  Discharged with plans to f/u with GI.  Has f/u with Dr Redmond Pulling 09/01/14.  States his bowels are doing better now.  Stool is softer.  No abdominal pain or cramping.  No nausea or vomiting.  Eating better.  Appetite better.  No fever.  Feels better.  Hand swelling resolved.  Just had injection in his thumb.  Trigger finger.  Some better.  Saw Dr Jefm Bryant.  Just had f/u.    Past Medical History  Diagnosis Date  . COPD with asthma   . Diabetes mellitus     Diet control   . Hyperlipidemia   . Carpal tunnel syndrome   . DJD (degenerative joint disease), cervical   . Vitamin D deficiency   . Tremor, essential   . Internal hemorrhoids   . Personal history of colonic polyps     adenomatous  . IBS (irritable bowel syndrome)   . Peptic ulcer   . Duodenal ulcer, with partial obstruction 08/10/2012  . Numbness and tingling in right hand     started 2 yeas ago  . Hypertension     no medicine needed  . Memory deficit 12/16/2013  . Essential and other specified forms of tremor 12/16/2013  . Polyneuropathy in diabetes(357.2)     Current Outpatient Prescriptions on File Prior to Visit  Medication Sig Dispense Refill  . amoxicillin (AMOXIL) 500 MG tablet Take 500 mg by mouth as needed. PRN for dental procedures       . ipratropium (ATROVENT) 0.06 % nasal spray Place 2 sprays into the nose daily as needed.       . pantoprazole (PROTONIX) 40 MG tablet TAKE 1 TABLET BY MOUTH ONCE DAILY, 30-60MINUTES BEFORE A MEAL.  30 tablet  11  . saccharomyces boulardii (FLORASTOR) 250 MG capsule Take 1 capsule (250 mg total) by mouth daily.  30 capsule  0  . Simethicone (GAS-X EXTRA STRENGTH PO) Take 1 tablet by mouth as needed.      . traMADol (ULTRAM) 50 MG tablet Take by mouth at bedtime as needed.      . Vitamin D, Ergocalciferol, (DRISDOL) 50000 UNITS CAPS Take 50,000 Units by mouth every 7 (seven) days. Friday.       No current facility-administered medications on file prior to visit.    Review of Systems Patient denies any headache, lightheadedness or dizziness.  No chest pain, tightness or palpitations.  No increased shortness of breath, cough or congestion.  No nausea or vomiting.  No acid reflux.  No increased abdominal pain or cramping.  Much better.  Bowels better.  No blood.  No hand swelling or pain.  Just saw Dr Jefm Bryant.  Trigger thumb.  Is s/p injection.  Is some better.  Objective:   Physical Exam  Filed Vitals:   06/19/14 1537  BP: 130/70  Pulse: 99  Temp: 98.3 F (56.75 C)   80 year old male in no acute distress.  HEENT:  Nares - clear.  Oropharynx - without lesions. NECK:  Supple.  Nontender.  No audible carotid bruit.  HEART:  Appears to be regular.   LUNGS:  No crackles or wheezing audible.  Respirations even and unlabored.   RADIAL PULSE:  Equal bilaterally.  ABDOMEN:  Soft.  Nontender.  Bowel sounds present and normal.  No audible abdominal bruit.  GU:  Normal descended testicles.  No palpable testicular nodules.   RECTAL:  Could not appreciate any palpable prostate nodules.  Heme negative.   EXTREMITIES:  No increased edema present.  DP pulses palpable and equal bilaterally.     MSK:  Trigger thumb.        Assessment & Plan:  HEALTH MAINTENANCE.  Physical today.   Planning to follow up with GI as outlined.    I spent 25 minutes with the patient and more than 50% of the time was spent in consultation regarding the above.

## 2014-06-22 NOTE — Assessment & Plan Note (Signed)
Blood pressure controlled.  Follow.

## 2014-08-13 ENCOUNTER — Encounter: Payer: Self-pay | Admitting: Neurology

## 2014-08-19 ENCOUNTER — Encounter: Payer: Self-pay | Admitting: Neurology

## 2014-08-20 ENCOUNTER — Ambulatory Visit: Payer: Medicare Other | Admitting: Podiatry

## 2014-09-01 DIAGNOSIS — K5909 Other constipation: Secondary | ICD-10-CM | POA: Insufficient documentation

## 2014-09-23 ENCOUNTER — Encounter: Payer: Self-pay | Admitting: Internal Medicine

## 2014-09-23 ENCOUNTER — Ambulatory Visit (INDEPENDENT_AMBULATORY_CARE_PROVIDER_SITE_OTHER): Payer: Medicare Other | Admitting: Internal Medicine

## 2014-09-23 VITALS — BP 120/60 | HR 74 | Temp 98.2°F | Ht 65.5 in | Wt 127.5 lb

## 2014-09-23 DIAGNOSIS — R634 Abnormal weight loss: Secondary | ICD-10-CM

## 2014-09-23 DIAGNOSIS — E039 Hypothyroidism, unspecified: Secondary | ICD-10-CM

## 2014-09-23 DIAGNOSIS — N6489 Other specified disorders of breast: Secondary | ICD-10-CM

## 2014-09-23 DIAGNOSIS — K589 Irritable bowel syndrome without diarrhea: Secondary | ICD-10-CM

## 2014-09-23 DIAGNOSIS — Z86010 Personal history of colonic polyps: Secondary | ICD-10-CM

## 2014-09-23 DIAGNOSIS — N649 Disorder of breast, unspecified: Secondary | ICD-10-CM

## 2014-09-23 DIAGNOSIS — E119 Type 2 diabetes mellitus without complications: Secondary | ICD-10-CM

## 2014-09-23 DIAGNOSIS — R413 Other amnesia: Secondary | ICD-10-CM

## 2014-09-23 DIAGNOSIS — I10 Essential (primary) hypertension: Secondary | ICD-10-CM

## 2014-09-23 DIAGNOSIS — S61411A Laceration without foreign body of right hand, initial encounter: Secondary | ICD-10-CM

## 2014-09-23 MED ORDER — MUPIROCIN 2 % EX OINT: TOPICAL_OINTMENT | CUTANEOUS | Status: DC

## 2014-09-23 NOTE — Progress Notes (Signed)
Pre visit review using our clinic review tool, if applicable. No additional management support is needed unless otherwise documented below in the visit note. 

## 2014-09-27 ENCOUNTER — Encounter: Payer: Self-pay | Admitting: Internal Medicine

## 2014-09-27 DIAGNOSIS — E039 Hypothyroidism, unspecified: Secondary | ICD-10-CM | POA: Insufficient documentation

## 2014-09-27 DIAGNOSIS — S50319A Abrasion of unspecified elbow, initial encounter: Secondary | ICD-10-CM | POA: Insufficient documentation

## 2014-09-27 NOTE — Progress Notes (Signed)
Subjective:    [name], male    DOB: November 06, 1929, 81 y.o.   MRN: [id number]  HPI 81 year old male with past history of chronic abdominal pain and bowel changes, hypertension, diabetes and weight loss.  He comes in today for a scheduled follow up.   He is accompanied by his wife.  History obtained from both of them.  He was admitted 06/02/14 to [facility] with diffuse abdominal pain.  CT revealed a large amount of fluid and some air in the stomach.  Some distention of small bowels and transverse colon.  Concern over possible large bowel obstruction.  Started on IV fluids and nasogastric suction.  Was seen by GI.  Symptoms improved.  Discharged with plans to f/u with GI.  Saw Dr Redmond Pulling 09/01/14.  See her note for details.  Planning for colonoscopy in 09/2014.  States his bowels are doing better now.  Takes Gas XNo abdominal pain or cramping.  No nausea or vomiting.  Eating better.  Appetite better. Weight is up a few pounds.  Cut his right hand recently.  Is healing well.  Has noticed a lesion on his right nipple.  No pain.  No nipple discharge or drainage from the site.     Past Medical History  Diagnosis Date  . COPD with asthma   . Diabetes mellitus     Diet control   . Hyperlipidemia   . Carpal tunnel syndrome   . DJD (degenerative joint disease), cervical   . Vitamin D deficiency   . Tremor, essential   . Internal hemorrhoids   . Personal history of colonic polyps     adenomatous  . IBS (irritable bowel syndrome)   . Peptic ulcer   . Duodenal ulcer, with partial obstruction 08/10/2012  . Numbness and tingling in right hand     started 2 yeas ago  . Hypertension     no medicine needed  . Memory deficit 12/16/2013  . Essential and other specified forms of tremor 12/16/2013  . Polyneuropathy in diabetes(357.2)     Current Outpatient Prescriptions on File Prior to Visit  Medication Sig Dispense Refill  . amoxicillin (AMOXIL) 500 MG tablet Take 500 mg by mouth as needed.  PRN for dental procedures    . Docusate Calcium (STOOL SOFTENER PO) Take by mouth.    Marland Kitchen ipratropium (ATROVENT) 0.06 % nasal spray Place 2 sprays into the nose daily as needed.     Marland Kitchen levothyroxine (SYNTHROID, LEVOTHROID) 50 MCG tablet Take 50 mcg by mouth daily before breakfast.    . loratadine (CLARITIN) 10 MG tablet Take 10 mg by mouth daily.    . pantoprazole (PROTONIX) 40 MG tablet TAKE 1 TABLET BY MOUTH ONCE DAILY, 30-60MINUTES BEFORE A MEAL. 30 tablet 11  . Simethicone (GAS-X EXTRA STRENGTH PO) Take 1 tablet by mouth as needed.    . traMADol (ULTRAM) 50 MG tablet Take by mouth at bedtime as needed.    . Vitamin D, Ergocalciferol, (DRISDOL) 50000 UNITS CAPS Take 50,000 Units by mouth every 7 (seven) days. Friday.     No current facility-administered medications on file prior to visit.    Review of Systems Patient denies any headache, lightheadedness or dizziness.  No chest pain, tightness or palpitations.  No increased shortness of breath, cough or congestion.  No nausea or vomiting.  No acid reflux.  No increased abdominal pain or cramping.  Much better.  Bowels better.  Takes Gas X.  No hand swelling  or pain.  S/p right hand laceration.  Nipple lesion as outlined.             Objective:   Physical Exam  Filed Vitals:   09/23/14 1427  BP: 120/60  Pulse: 74  Temp: 98.2 F (23.16 C)   81 year old male in no acute distress.  HEENT:  Nares - clear.  Oropharynx - without lesions. NECK:  Supple.  Nontender.  No audible carotid bruit.  HEART:  Appears to be regular.   LUNGS:  No crackles or wheezing audible.  Respirations even and unlabored.   RADIAL PULSE:  Equal bilaterally.  NIPPLE:  Raised lesion - right nipple.   ABDOMEN:  Soft.  Nontender.  Bowel sounds present and normal.  No audible abdominal bruit.  EXTREMITIES:  No increased edema present.  DP pulses palpable and equal bilaterally.        Assessment & Plan:  1. Essential hypertension Blood pressure doing well.  Follow  pressures.   2. Irritable bowel syndrome Bowels better.  Planning for colonoscopy next month.    3. Type 2 diabetes mellitus without complication Sugars better with the weight loss.  Follow sugars.  Check met b and a1c.    4. Memory deficit Stable.    5. History of colonic polyps Planning for colonoscopy next month.    6. Loss of weight Appetite better.  Weight up a few pounds from last check.  Follow.  Planning for colonoscopy next month.    7. Nipple lesion Raised lesion as outlined.  bacrtoban as directed.  Call next week with update.    8. Hand laceration, right, initial encounter Healing well.  bactroban topically.  Follow.    9. Hypothyroidism, unspecified hypothyroidism type On synthroid.  Follow tsh.    HEALTH MAINTENANCE.  Physical 06/19/14.  Planning to follow up with GI as outlined.  Planning colonoscopy next month.    I spent 25 minutes with the patient and more than 50% of the time was spent in consultation regarding the above.

## 2014-10-01 ENCOUNTER — Telehealth: Payer: Self-pay | Admitting: Internal Medicine

## 2014-10-01 ENCOUNTER — Other Ambulatory Visit: Payer: Self-pay | Admitting: Internal Medicine

## 2014-10-01 DIAGNOSIS — N649 Disorder of breast, unspecified: Secondary | ICD-10-CM

## 2014-10-01 NOTE — Telephone Encounter (Signed)
The lump on his chest at his nipple has only went down a little bit per Mrs. Pattison . She informed me that she was to call back to let the physician know.

## 2014-10-01 NOTE — Telephone Encounter (Signed)
Since it is persistent, I would like to have dermatology or surgery evaluate.  If agreeable let me know and let me know if have preference of who they want to see.

## 2014-10-01 NOTE — Progress Notes (Signed)
Order placed for referral to Dr Tamala Julian for persistent nipple lesion.

## 2014-10-01 NOTE — Telephone Encounter (Signed)
Order placed for referral to Dr Rochel Brome.

## 2014-10-01 NOTE — Telephone Encounter (Signed)
Spoke with pts wife, advised of message.  She states she prefers Dr Rochel Brome at [facility].

## 2014-10-06 ENCOUNTER — Other Ambulatory Visit (INDEPENDENT_AMBULATORY_CARE_PROVIDER_SITE_OTHER): Payer: TRICARE For Life (TFL)

## 2014-10-06 DIAGNOSIS — I10 Essential (primary) hypertension: Secondary | ICD-10-CM

## 2014-10-06 DIAGNOSIS — E119 Type 2 diabetes mellitus without complications: Secondary | ICD-10-CM | POA: Diagnosis not present

## 2014-10-06 DIAGNOSIS — R413 Other amnesia: Secondary | ICD-10-CM

## 2014-10-06 LAB — CBC WITH DIFFERENTIAL/PLATELET
Basophils Absolute: 0 10*3/uL (ref 0.0–0.1)
Basophils Relative: 0.3 % (ref 0.0–3.0)
EOS PCT: 4.1 % (ref 0.0–5.0)
Eosinophils Absolute: 0.5 10*3/uL (ref 0.0–0.7)
HEMATOCRIT: 43.9 % (ref 39.0–52.0)
HEMOGLOBIN: 13.8 g/dL (ref 13.0–17.0)
LYMPHS ABS: 1.9 10*3/uL (ref 0.7–4.0)
Lymphocytes Relative: 16.7 % (ref 12.0–46.0)
MCHC: 31.5 g/dL (ref 30.0–36.0)
MCV: 96.4 fl (ref 78.0–100.0)
MONO ABS: 0.7 10*3/uL (ref 0.1–1.0)
MONOS PCT: 6.7 % (ref 3.0–12.0)
NEUTROS ABS: 8 10*3/uL — AB (ref 1.4–7.7)
Neutrophils Relative %: 72.2 % (ref 43.0–77.0)
Platelets: 249 10*3/uL (ref 150.0–400.0)
RBC: 4.55 Mil/uL (ref 4.22–5.81)
RDW: 13.5 % (ref 11.5–15.5)
WBC: 11.1 10*3/uL — ABNORMAL HIGH (ref 4.0–10.5)

## 2014-10-06 LAB — COMPREHENSIVE METABOLIC PANEL
ALBUMIN: 4.2 g/dL (ref 3.5–5.2)
ALT: 16 U/L (ref 0–53)
AST: 25 U/L (ref 0–37)
Alkaline Phosphatase: 61 U/L (ref 39–117)
BILIRUBIN TOTAL: 0.4 mg/dL (ref 0.2–1.2)
BUN: 19 mg/dL (ref 6–23)
CALCIUM: 9.6 mg/dL (ref 8.4–10.5)
CO2: 35 mEq/L — ABNORMAL HIGH (ref 19–32)
CREATININE: 0.8 mg/dL (ref 0.4–1.5)
Chloride: 91 mEq/L — ABNORMAL LOW (ref 96–112)
GFR: 117.3 mL/min (ref >60.00)
GLUCOSE: 105 mg/dL — AB (ref 70–99)
Potassium: 4.4 mEq/L (ref 3.5–5.1)
SODIUM: 133 meq/L — AB (ref 135–145)
Total Protein: 7.4 g/dL (ref 6.0–8.3)

## 2014-10-06 LAB — LIPID PANEL
Cholesterol: 216 mg/dL — ABNORMAL HIGH (ref 0–200)
HDL: 94.1 mg/dL (ref >39.00)
LDL CALC: 109 mg/dL — AB (ref 0–99)
NonHDL: 121.9
Total CHOL/HDL Ratio: 2
Triglycerides: 67 mg/dL (ref 0.0–149.0)
VLDL: 13.4 mg/dL (ref 0.0–40.0)

## 2014-10-06 LAB — HEMOGLOBIN A1C: Hgb A1c MFr Bld: 6.3 % (ref 4.6–6.5)

## 2014-10-06 LAB — TSH: TSH: 2.15 u[IU]/mL (ref 0.35–4.50)

## 2014-10-08 ENCOUNTER — Other Ambulatory Visit: Payer: Self-pay | Admitting: Internal Medicine

## 2014-10-08 DIAGNOSIS — E871 Hypo-osmolality and hyponatremia: Secondary | ICD-10-CM

## 2014-10-08 DIAGNOSIS — D72829 Elevated white blood cell count, unspecified: Secondary | ICD-10-CM

## 2014-10-08 NOTE — Progress Notes (Signed)
Order placed for f/u labs.  

## 2014-10-11 ENCOUNTER — Encounter: Payer: Self-pay | Admitting: Internal Medicine

## 2014-10-13 ENCOUNTER — Ambulatory Visit: Payer: Self-pay | Admitting: Podiatry

## 2014-10-15 ENCOUNTER — Ambulatory Visit: Payer: Self-pay | Admitting: Podiatry

## 2014-10-20 ENCOUNTER — Other Ambulatory Visit (INDEPENDENT_AMBULATORY_CARE_PROVIDER_SITE_OTHER): Payer: TRICARE For Life (TFL)

## 2014-10-20 DIAGNOSIS — E871 Hypo-osmolality and hyponatremia: Secondary | ICD-10-CM | POA: Diagnosis not present

## 2014-10-20 DIAGNOSIS — D72829 Elevated white blood cell count, unspecified: Secondary | ICD-10-CM

## 2014-10-20 LAB — CBC WITH DIFFERENTIAL/PLATELET
Basophils Absolute: 0 10*3/uL (ref 0.0–0.1)
Basophils Relative: 0.6 % (ref 0.0–3.0)
Eosinophils Absolute: 0.4 10*3/uL (ref 0.0–0.7)
Eosinophils Relative: 5.5 % — ABNORMAL HIGH (ref 0.0–5.0)
HCT: 43.4 % (ref 39.0–52.0)
Hemoglobin: 14 g/dL (ref 13.0–17.0)
LYMPHS ABS: 1.8 10*3/uL (ref 0.7–4.0)
Lymphocytes Relative: 23.6 % (ref 12.0–46.0)
MCHC: 32.2 g/dL (ref 30.0–36.0)
MCV: 94.8 fl (ref 78.0–100.0)
MONO ABS: 0.5 10*3/uL (ref 0.1–1.0)
Monocytes Relative: 6.4 % (ref 3.0–12.0)
NEUTROS PCT: 63.9 % (ref 43.0–77.0)
Neutro Abs: 5 10*3/uL (ref 1.4–7.7)
PLATELETS: 283 10*3/uL (ref 150.0–400.0)
RBC: 4.58 Mil/uL (ref 4.22–5.81)
RDW: 13.3 % (ref 11.5–15.5)
WBC: 7.8 10*3/uL (ref 4.0–10.5)

## 2014-10-20 LAB — BASIC METABOLIC PANEL
BUN: 12 mg/dL (ref 6–23)
CALCIUM: 9.5 mg/dL (ref 8.4–10.5)
CO2: 37 mEq/L — ABNORMAL HIGH (ref 19–32)
CREATININE: 0.78 mg/dL (ref 0.40–1.50)
Chloride: 92 mEq/L — ABNORMAL LOW (ref 96–112)
GFR: 122.51 mL/min (ref >60.00)
Glucose, Bld: 111 mg/dL — ABNORMAL HIGH (ref 70–99)
Potassium: 4.6 mEq/L (ref 3.5–5.1)
Sodium: 134 mEq/L — ABNORMAL LOW (ref 135–145)

## 2014-10-21 ENCOUNTER — Encounter: Payer: Self-pay | Admitting: *Deleted

## 2014-10-22 ENCOUNTER — Ambulatory Visit (INDEPENDENT_AMBULATORY_CARE_PROVIDER_SITE_OTHER): Payer: TRICARE For Life (TFL) | Admitting: Podiatry

## 2014-10-22 DIAGNOSIS — B351 Tinea unguium: Secondary | ICD-10-CM | POA: Diagnosis not present

## 2014-10-22 DIAGNOSIS — M79676 Pain in unspecified toe(s): Secondary | ICD-10-CM

## 2014-10-22 NOTE — Progress Notes (Signed)
He presents today with chief complaint of painful elongated toenails that are incurvated..  Objective: Pulses are palpable nails are thick yellow dystrophic with mycotic and painful palpation.  Assessment: Pain in limb secondary to onychomycosis 1 through 5 bilateral.  Plan: Debridement of nails 1 through 5 bilateral cover service secondary to pain.

## 2015-01-13 NOTE — Op Note (Signed)
PATIENT NAME:  Aaron Hendrix, Aaron Hendrix MR#:  [id number] DATE OF BIRTH:  June 11, 1930  DATE OF PROCEDURE:  07/04/2012  PREOPERATIVE DIAGNOSIS: History of a penetrating peptic ulcer.   POSTOPERATIVE DIAGNOSIS: History of a penetrating peptic ulcer with gastritis.   PROCEDURE: Upper endoscopy and biopsy.   SURGEON: Rochel Brome, MD  ANESTHESIA: Intravenous sedation with monitored anesthesia care.   INDICATIONS: This 81 year old male had a recent history of severe epigastric pain. He had CT findings of a penetrating ulcer in the region of distal antrum and duodenum. He was treated, initially keeping him n.p.o., on IV fluids, and with intravenous Protonix and later diet was gradually introduced. He went home on Protonix and has been doing well with no pain, tolerating his diet, and brought for endoscopy to determine if the ulcer is healed.  DESCRIPTION OF PROCEDURE: The patient was placed on the stretcher, in the left lateral decubitus position. He was monitored by pulse oximetry, intermittent blood pressure recordings, and cardiac monitor. He was given oxygen via nasal cannula. He was sedated by the anesthesia staff. The bite block was used.  The Olympus video endoscope was inserted easily down to and through the posterior pharynx into the esophagus and advanced easily down through the esophagus which had typical mucosa with no ulcerations. The scope was advanced down into the stomach. There was some clear colorless gastric fluids. This was a trace amount which was aspirated. The stomach was distended with carbon dioxide. The scope was advanced into the duodenum. There was some minimal degree of duodenal deformity, in the region of the bulb, but no ulcer was seen. The scope was advanced down as far as the third part of the duodenum. The scope was gradually pulled back examining duodenum circumferentially and no ulcer was seen. The scope was pulled back up into the stomach and retroflexed to view the cardia and  later straightened and further examined the antrum. There was some evidence of erythema of the antrum. Some of the erythema was in longitudinal streaks. Four random biopsies were taken of the greater curvature of the antrum. Bleeding was very scant. Hemostasis was subsequently intact. The scope was gradually pulled back circumferentially examining the antrum and body of the stomach seeing no ulcers or polyps. Carbon dioxide was aspirated. The scope was gradually pulled back through the esophagus seeing no esophageal lesions and the scope was removed. The patient tolerated the procedure satisfactorily and was then prepared for transfer to the recovery room.  ____________________________ Lenna Sciara. Rochel Brome, MD [email or web address] D: 07/04/2012 08:43:47 ET T: 07/04/2012 09:18:41 ET JOB#: [id number]  cc: Loreli Dollar, MD, <Dictator> Loreli Dollar MD ELECTRONICALLY SIGNED 07/05/2012 12:38

## 2015-01-13 NOTE — Discharge Summary (Signed)
PATIENT NAME:  Aaron Hendrix, Krat[place]vil MR#:  [id number] DATE OF BIRTH:  06-14-1930  DATE OF ADMISSION:  04/25/2012 DATE OF DISCHARGE:  04/29/2012  HISTORY/HOSPITAL COURSE: This 81 year old came into the emergency room with the chief complaint of abdominal pain for some one-week duration primarily in the left upper abdomen and extending across the epigastrium. He also had some dark stool but also had been taking Pepto-Bismol.   He has a past medical history of recent bout of pneumonia treated outpatient with antibiotics, history of diabetes, which has resolved, history of chronic diarrhea over the last two years and has had gastroenterology consultation and treatment in [place]. He has been taking Creon but continues to have chronic diarrhea. He has had previous right inguinal hernia repair.   Details are recorded on the typed history and physical.   On examination his abdomen was with minimal subxiphoid tenderness and right upper quadrant tenderness.   His CT scan demonstrated a penetrating ulcer at the junction of the stomach and duodenum.   Liver panel with a low albumin of 3.0. Cardiac enzymes negative. CBC with a white blood count of 11,100, hemoglobin 12.8, and MCV was 103.   He was admitted emergently to the hospital with a penetrating ulcer and was placed on intravenous Protonix and initially kept NPO.   Serum Helicobacter test was negative.   He gradually improved during the course of the hospitalization. He did have some persistent diarrhea and was treated with Imodium. He initially began a clear liquid diet which he tolerated satisfactorily and was later advanced to full liquids which he tolerated satisfactorily.   He was eventually discharged in satisfactory condition.   FINAL DIAGNOSES: Penetrating peptic ulcer.  DISCHARGE INSTRUCTIONS: He was advised to avoid the nonsteroidal anti-inflammatory agents. He was advised to take Tylenol if needed for joint pain and was advised to take  Protonix 40 mg daily and plans were made for follow-up in the office. Also anticipate eventual follow-up studies such as endoscopy in approximately eight weeks to see if this ulcer heals.   The patient appeared to have satisfactory understanding and will follow-up in the office. ____________________________ J. Rochel Brome, MD jws:slb D: 05/08/2012 19:13:31 ET T: 05/09/2012 09:57:02 ET JOB#: [id number]  cc: Loreli Dollar, MD, <Dictator> Loreli Dollar MD ELECTRONICALLY SIGNED 05/09/2012 12:37

## 2015-01-13 NOTE — H&P (Signed)
PATIENT NAME:  CARSIN, RANDAZZO MR#:  [id number] DATE OF BIRTH:  1930-01-04  DATE OF ADMISSION:  04/25/2012  ADDENDUM:  Completion of history and physical.  MEDICATIONS: 1. Amoxicillin-clavulanate 875/125 mg twice a day, ending today. 2. Aspirin 81 mg daily.  3. Atacand/HCT 32 mg/12 .5 mg daily.  4. Clobetasol topical 0.05% cream two times per day. 5. Pancrelipase extended-release three times daily.  6. Pravastatin 20 mg once daily.   ALLERGIES: Latex. ____________________________ Lenna Sciara. Rochel Brome, MD jws:slb D: 04/25/2012 18:34:27 ET T: 04/26/2012 07:40:13 ET JOB#: [id number]  cc: Loreli Dollar, MD, <Dictator> Loreli Dollar MD ELECTRONICALLY SIGNED 04/28/2012 10:44

## 2015-01-13 NOTE — H&P (Signed)
PATIENT NAME:  Aaron Hendrix, Aaron Hendrix MR#:  [id number] DATE OF BIRTH:  05-16-1930  DATE OF ADMISSION:  04/25/2012  HISTORY OF PRESENT ILLNESS: This 81 year old male came into the Emergency Room with the chief complaint of abdominal pain. He reports he has been having pain for approximately a week. It is primarily in the left upper abdomen and extends across the epigastrium. It does not radiate into his back. He reports that he has recently taken some Pepto-Bismol. He did have some dark stool after that but has not seen any blood in his bowel movement. He reports recent mild decrease in oral intake, some loss of appetite. He reports no nausea or vomiting. He has had some rare heartburn in the past but none recently.   PAST MEDICAL HISTORY:  1. He reports that just about a week ago he was found to have pneumonia and was treated by his primary care physician in [place] with an antibiotic which is Augmentin and has just completed his course of antibiotics today. He reports no current dyspnea. He had some recent minimal coughing, but no significant coughing today.  2. He has no history of heart disease.  3. He has been hypertensive.  4. He has a history of diabetes, but reports that that is mostly resolved. 5. He has a chronic history of diarrhea over the past two years and has seen gastroenterology with evaluation in [place]. He had colonoscopy approximately 1-1/2 years ago, which he reports was normal. He also had an upper endoscopy 1-1/2 years ago, which he recalls was normal. He has been taking Creon, but continues to have chronic diarrhea.  6. Previous right inguinal hernia repair.   SOCIAL HISTORY: He has smoked in the past, but not in recent years. He does drink about two beers each night.   FAMILY HISTORY: He reports no specific familial illness.  REVIEW OF SYSTEMS: He reports no recent visual or auditory problems. He did have the recent illness with pneumonia, but reports no current fever or  dyspnea. He reports no chest pain. He reports no urinary symptoms. No recent sores or boils. No swollen glands. He does have chronic pain in the right hip for which he takes Aleve. He does not have to limp, but can feel the pain when he walks. His review of systems is otherwise negative.   PHYSICAL EXAMINATION:   GENERAL: He is awake, alert, and oriented, resting on the ER stretcher, in no acute distress.   VITAL SIGNS: Temperature is 97.6, pulse 87, respirations 16, blood pressure 152/68, and oxygen saturation 86%.   SKIN: Warm and dry without rash.   HEENT: Pupils are equal, round, and reactive to light. Extraocular movements are intact. Sclerae are clear. Pharynx is clear. He has dentures.   NECK: No palpable mass.   LUNGS: Lungs sounds are clear.   HEART: Regular rhythm S1 and S2 without murmur.   ABDOMEN: Minimal degree of subxiphoid tenderness and right upper quadrant tenderness. No guarding. Lower abdomen is soft and nontender. There is no palpable abdominal mass.   EXTREMITIES: No dependent edema.   NEUROLOGIC: Awake, alert, and oriented. Moving all extremities.   LABS/RADIOLOGIC STUDIES: EKG: Sinus tachycardia, possible left atrial enlargement, right bundle branch block, and left anterior fascicular block.   I reviewed his CT images and the report which indicates evidence of an ulcer at the junction of the stomach and duodenum and it appears the ulcer may be deeply perforating.  Chest x-ray: Some atelectasis of the left base versus fibrosis.  There is no evidence of pneumonia on this chest film.   ADDITIONAL CLINICAL DATA: His renal panel was normal. Liver panel with a low albumin of 3.0. Cardiac enzymes are negative. CBC with a white blood count of 11,100, hemoglobin 12.8, platelet count 284,000, and MCV 103.   IMPRESSION: Penetrating peptic ulcer.   RECOMMENDATIONS: I have recommended admission to the hospital, initially keep n.p.o., give IV fluids, and give intravenous  Protonix. I discussed that we will keep him n.p.o. for a number of days and eventually start clear liquids. At some point there may be a role for upper GI x-ray and at a later date may be a role for upper endoscopy. I discussed also doing a serum test for Helicobacter and so we will get him admitted. This was discussed in detail with the patient and his family. ____________________________ Lenna Sciara. Rochel Brome, MD jws:slb D: 04/25/2012 18:31:38 ET T: 04/26/2012 41:96:22 ET JOB#: [id number]  cc: Loreli Dollar, MD, <Dictator> Loreli Dollar MD ELECTRONICALLY SIGNED 04/28/2012 10:44

## 2015-01-17 NOTE — Consult Note (Signed)
Details:   - GI Note:  S: no stool today. Abd less distended. passing gas.  No n/v, abd pain.   Exam:  vss nabs, soft, nt,nd cta no w/c rrr, no m/r/g  A/P: - resolving ileus.  - recommend starting miralax 34 gr daily - can f/u in GI clinic   Electronic Signatures: Arther Dames (MD)  (Signed 10-Sep-15 18:26)  Authored: Details   Last Updated: 10-Sep-15 18:26 by Arther Dames (MD)

## 2015-01-17 NOTE — H&P (Signed)
PATIENT NAME:  Aaron Hendrix, Aaron Hendrix MR#:  [id number] DATE OF BIRTH:  [birth year]  DATE OF ADMISSION:  06/02/2014  HISTORY OF PRESENT ILLNESS: This 81 year old male was admitted emergently through the Emergency Room with a chief complaint of diffuse abdominal pain.  He reports that he was in his usual state of health until 3 days ago when he realized he needed to move his bowels and did have just a small bowel movement, but over the last few days he has developed diffuse abdominal pain and distention and went to the Emergency Room 1 time and had x-ray findings of intestinal obstruction and was given lactulose and Bentyl and returned home.  He took 1 dose of lactulose and one dose of Bentyl.  He has also taken 3 ounces of magnesium citrate and has not had a bowel movement.  He returned to the Emergency Room yesterday with diffuse abdominal pain and failure to move his bowels and vomiting.  He reports that the material he vomited was dark in color.  It was tested for blood in the Emergency Room and was positive although there were no bright red bleeding.  He does report that in recent months his bowel movements have been somewhat slow, has not seen any blood in his bowel movements and has not had any black bowel movements.  He reports that over the last few years has had some substantial weight loss, but feels that his weight has been more stable in recent months and in recent months has been taking a regular diet and tolerating satisfactorily.   PAST MEDICAL HISTORY: He does have history of peptic ulcer disease and did have an ulcer in 2013, which was treated medically and resolved.  He did have upper endoscopy at the [facility] clinic in June of last year which I reviewed the report which was normal.  He does not recall if he has had a colonoscopy in the past or not.  He has recently been seen by Dr. Redmond Pulling in gastroenterology at [facility].  He is chronically taking Protonix, possibly for reflux, but he is not certain  whether he has reflux or not.   OTHER PAST MEDICAL HISTORY: He does see [name] for routine medical care, does have a distant history of asthma, but has not had any asthmatic symptoms in recent months. He reports no history of heart disease, no history of hepatitis.   PAST SURGICAL HISTORY:  He has had no previous abdominal surgery. He has had a right hip replacement.   MEDICATIONS: Include pantoprazole 40 mg daily, Florastor 250 mg daily.  He took 1 dose of Bentyl prescribed in the Emergency Room and 1 dose of lactulose.  Mirtazapine 15 mg at bedtime.   DRUG ALLERGIES: ACCUPRIL, ALLEGRA, LATEX.   REVIEW OF SYSTEMS: He reports no other recent acute illness such as cough, cold, or sore throat.  No recent visual changes. No difficulty swallowing. No swollen glands in his neck.  No dyspnea on exertion.  He reports no chest pain.  He reports no ankle edema.  No recent sores or boils, has been emptying his bladder satisfactorily with no urinary symptoms. Recently has been fairly active, driving, walking without difficulty.   A review of systems was otherwise negative.   PHYSICAL EXAMINATION:  GENERAL: He is awake, alert, and oriented, resting in the hospital bed.  His weight is 124 pounds.  Height is 64 inches.  BMI of 21.4.  VITAL SIGNS: Temperature is 98.6, pulse 104, respirations 18, blood pressure 154/78,  pulse oximetry 92%.  SKIN: Warm and dry without rash.  HEENT: Pupils equal, reactive to light. Extraocular movements are intact. Sclerae clear. Pharynx clear.  NECK: No palpable mass. An NG tube is draining a small amount of bile.  LUNGS: Sounds were clear. No respiratory distress.  HEART: Regular rhythm, S1 and S2 without murmur.  ABDOMEN: Appears to be moderately protuberant and is tender in all quadrants, but there is no guarding, tenderness is mild.  There is no palpable mass.  No palpable hernia.   RECTAL:  Exam demonstrates small amount of stool within the rectum, good sphincter  tone.  No palpable rectal mass. No enlargement of the prostate.  EXTREMITIES: With no pedal edema.  NEUROLOGIC: Awake, alert, oriented, moving all extremities   CLINICAL DATA: Glucose is 200, creatinine 1.47, sodium 128.  Liver panel with elevated SGOT of 43, white blood count of 18,300, hemoglobin 14.1, platelet count 259,000.    I reviewed his CT images, which demonstrated a large amount of fluid and some air in the stomach. There is also some distention of small bowel in his right and transverse colon.  There is a small amount of stool was seen throughout the sigmoid colon and small amount of stool seen in the rectum. His plain films also appear suspicious of a large bowel obstruction.   IMPRESSION: Acute abdominal pain, possible large bowel obstruction.   PLAN: Continue IV fluids and nasogastric suction.  I discussed with him doing a soapsuds enema.  Also, there may be a role for colonoscopy versus barium enema for further evaluation of his large bowel.   Also I had asked Dr. Arther Dames, gastroenterologist, to see him for gastroenterology consultation.  Plan is also to have frequent walking in the hospital.      ____________________________ J. Rochel Brome, MD jws:DT D: 06/02/2014 11:30:14 ET T: 06/02/2014 12:10:39 ET JOB#: [id number]  cc: Loreli Dollar, MD, <Dictator> [name], MD Loreli Dollar MD ELECTRONICALLY SIGNED 06/06/2014 17:22

## 2015-01-17 NOTE — Discharge Summary (Signed)
PATIENT NAME:  Aaron Hendrix, Aaron Hendrix MR#:  [id number] DATE OF BIRTH:  [birth year]  DATE OF ADMISSION:  06/01/2014 DATE OF DISCHARGE:  06/06/2014  HISTORY OF PRESENT ILLNESS: This 81 year old male was admitted emergently with recent development of obstipation and constipation and developed diffuse abdominal pain. He was evaluated in the Emergency Room and had x-ray findings suggestive of possible bowel obstruction. He was admitted by Dr. Bary Castilla and then transferred to my service. He does have a past history of peptic ulcer disease and gastroesophageal reflux.   PAST MEDICAL HISTORY: Has included a distant history of asthma. Other than that has lost some weight in recent years, but has been more stable in recent months.   HOME MEDICATIONS: Include pantoprazole 40 mg daily, Florastor 250 mg daily. Bentyl had recently been prescribed at an earlier Emergency Room visit. He also takes mirtazapine 15 mg at bedtime.   PHYSICAL EXAMINATION:  GENERAL: His BMI was 21.4.  VITAL SIGNS:  Pulse rate 104, blood pressure 154/78.  ABDOMEN: Appeared to be moderately protuberant with mild degree of tenderness.  RECTAL: Demonstrated no palpable mass.   I reviewed CT images which demonstrated large amount of fluid and some air in the stomach, also had some distention of the small bowel and right and transverse colon.   It is noted that when he was admitted he was placed on nasogastric suction.   CLINICAL DATA: His glucose was as high as 200, returned to normal on September 8. His creatinine on September 6 was 1.47, by September 8 was down to 0.98. White blood count on September 7 was high as 18.3 and by September 8 was down to 12.8. His hemoglobin was normal.   HOSPITAL COURSE: He also had consultation with Dr. Rayann Heman in gastroenterology. Dr. Rayann Heman was impressed that he may have chronic constipation, possible ileus.  HOSPITAL COURSE:  He was treated with enemas and did have very good results from enemas, had multiple  bowel movements while in the hospital. His nasogastric tube was removed. He was begun on a liquid diet and gradually advanced his diet and tolerated well. His x-ray findings improved significantly.   We had discussed possible barium enema or colonoscopy, but elected not to do this while in the hospital seeing he improved.   FINAL DIAGNOSIS: Partial colonic obstruction.   PLAN: Discharge from hospital. Take a regular diet. Continue usual medicines.  Avoid Bentyl.  Plan is for followup in the office with Dr. Rayann Heman and at time of office consultation consider colonoscopy for further evaluation of the colon.     ____________________________ Lenna Sciara. Rochel Brome, MD jws:bu D: 06/20/2014 17:39:23 ET T: 06/20/2014 20:05:47 ET JOB#: [id number]  cc: Loreli Dollar, MD, <Dictator> [name], MD Loreli Dollar MD ELECTRONICALLY SIGNED 06/22/2014 10:50

## 2015-01-17 NOTE — Consult Note (Signed)
Brief Consult Note: Diagnosis: abd distension, abd pain, vomiting.   Patient was seen by consultant.   Consult note dictated.   Recommend further assessment or treatment.   Comments: Likely ileus vs sigmoid fecal impaction.    Recs: - continue q 8 hour enemas - once able clamp NG and tolerating, would drip go-lytely into NG until he has a bowel movement - doubt he would be able to tolerate colon prep as inpt but barium enema would be reasonble for diagnostic and theraputic purposes.  Electronic Signatures: Arther Dames (MD)  (Signed 07-Sep-15 20:06)  Authored: Brief Consult Note   Last Updated: 07-Sep-15 20:06 by Arther Dames (MD)

## 2015-01-17 NOTE — Consult Note (Signed)
PATIENT NAME:  Aaron Hendrix, Aaron Hendrix MR#:  [id number] DATE OF BIRTH:  1930-08-23  DATE OF CONSULTATION:  06/02/2014  REFERRING PHYSICIAN:  Loreli Dollar, MD  CONSULTING PHYSICIAN:  Arther Dames, MD  REASON FOR CONSULTATION: Ileus, possible colonic obstruction.   HISTORY OF PRESENT ILLNESS: Aaron Hendrix is an 81 year old male with a past medical history notable for peptic ulcer disease, GERD, and asthma, who presented to the Emergency Room for evaluation of abdominal pain and distention. In the Emergency Room, he did have a CT scan showing likely ileus, but also a question of the cecum extending up into the mid abdomen. There was also the possibility of a transition point in the sigmoid colon.   From talking to Aaron Hendrix and his wife, he has struggled with constipation and abdominal pain for several years and has seen different gastroenterologists for this condition. He has seen Dr. Candis Shine at [facility].   From what I understand, this has also happened in the past. Basically, he developed a distended stomach with abdominal pain. He also had not had a bowel movement in approximately 4 days.   During this hospitalization, he did have a NG tube placed for decompression and has been doing somewhat better since this was placed. He is passing some gas, but has not passed any stoolage. He does continue to have dark output from his NG tube.   In terms of constipation, he does struggle quite a bit with this. He does not take anything on a regular basis, but does take some as needed Ex-Lax. I understand he has struggles with very large bowel movements and straining and passes sometimes a stool with very hard stool ball on the end.   PAST MEDICAL HISTORY: 1.  Peptic ulcer disease.  2.  Asthma.   PAST SURGICAL HISTORY: Status post right hip replacement.   HOME MEDICATIONS: Include pantoprazole 40 mg daily, Florastor 250 mg daily, mirtazapine 15 mg at bedtime.   ALLERGIES: ACCUPRIL, ALLEGRA, AND  LATEX.   SOCIAL HISTORY: He denies any alcohol or tobacco to me.   FAMILY HISTORY: No family history of GI malignancy that he is aware of.   PHYSICAL EXAMINATION: VITAL SIGNS: Currently, his temperature is 98.9, pulse is 102, respirations are 18, blood pressure 169/84, and pulse oximetry is 93% on room air.  GENERAL: Alert and oriented times 4.  No acute distress. Appears stated age. HEENT: Normocephalic/atraumatic. Extraocular movements are intact. Anicteric. He does have an NG tube in place. He does have some dark output from this tube.  NECK: Soft, supple. JVP appears normal. No adenopathy. CHEST: Clear to auscultation. No wheeze or crackle. Respirations unlabored. HEART: Regular. No murmur, rub, or gallop.  Normal S1 and S2. ABDOMEN: There is significant distention, although not taut. There is mild diffuse tenderness, and the bowel sounds are scant. No organomegaly. No masses. EXTREMITIES: No swelling, well perfused. SKIN: No rash or lesion. Skin color, texture, turgor normal. NEUROLOGICAL: Grossly intact. PSYCHIATRIC: Normal tone and affect. MUSCULOSKELETAL: No joint swelling or erythema.   LABORATORY DATA: Sodium 128, potassium 4.6, BUN 21, creatinine 1.47. Liver enzymes are normal. Troponin is less than 0.02. White count is 18, hemoglobin is 14, hematocrit is 43, and platelets are 259. Imaging data per the radiologist read: There is gas distended in the small and large bowel with possible cecal ileus changes, likely represent ileus. No free intra-abdominal air.   ASSESSMENT AND PLAN: Abdominal pain, abdominal distention, chronic constipation. Imaging does suggest an ileus, and there was  also a possibility of a transition point in the distal sigmoid colon. Given his ongoing struggles with obstipation, I wonder if a component of this could be related to impacted stool ball. He unfortunately is not on a good regimen for his constipation, and we will work on this while he is inpatient.    RECOMMENDATIONS: Would recommend giving a Fleets enema every 8 hours. Once we are able to safely clamp the NG tube and he is tolerating that, I would consider dripping in GoLYTELY or MiraLax as he will tolerate to help also produce a bowel movement from above. Once we are getting significant stoolage, then I would consider further diagnostic testing such as a barium enema or possibly colonoscopy. However, for now, I think that these would be difficult tests for him to tolerate.   We will monitor his clinical status. I suspect he would be better able to tolerate a colonoscopy as an outpatient once he is out from this acute episode.   Thank you for this consult.    ____________________________ Arther Dames, MD mr:nb D: 06/02/2014 20:03:47 ET T: 06/02/2014 20:26:57 ET JOB#: [id number]  cc: Arther Dames, MD, <Dictator> Mellody Life MD ELECTRONICALLY SIGNED 06/12/2014 20:44

## 2015-01-17 NOTE — H&P (Signed)
PATIENT NAME:  Aaron Hendrix, Aaron Hendrix MR#:  [id number] DATE OF BIRTH:  06-12-1930  DATE OF ADMISSION:  06/02/2014  ADMISSION DIAGNOSES: Abdominal pain, nausea and vomiting.   CLINICAL NOTE: This 81 year old male presented to the Emergency Room in the early morning hours of September 6 with reports of abdominal pain and poor bowel function. He was subsequently discharged approximately 7:00 a.m. after plain films and a CT scan were completed. By report, he made use of an enema at home, followed by bottle of citrate of magnesia. This was followed by episodes of vomiting. He subsequently returned to the Emergency Department on the evening of September 6, where he was reassessed, and with abdominal distention, pain and vomiting, admission was felt appropriate.   The patient was seen in the company of his wife and family. He has had a long history of abdominal discomfort being diagnosed with IBS. He has been followed by local gastroenterology physicians, seen in [place], as well as [facility]. That evaluation was primarily for fecal incontinence. The patient's wife reports he has a bowel movement every few days, but they were quite concerned that it had not had a bowel movement since September 4, at the time of his September 6 Emergency Department evaluation. A nasogastric tube had been passed by the emergency physician prior to my arrival, and the patient returned a large volume of dark brown fluid.   There is no history of recent nonsteroidal, aspirin or prednisone use. He had been managed with prednisone under the care of Precious Reel, M.D., from the rheumatology department, as late as July 2015.   The patient was admitted in July 2013 with a penetrating duodenal ulcer, which was managed conservatively. He has been maintained on Protonix since that time. Follow-up endoscopy completed in October 2013 showed a duodenal deformity, but no ulcer. Mild gastritis was identified. Biopsies, at that time, had  showed antral mucosa with superficial vascular congestion and reactive changes. H. pylori organisms were identified.   This patient denied previous episodes of abdominal distention, but did admit to a long history of intermittent abdominal pain.   PHYSICAL EXAMINATION: GENERAL: Examination in the Emergency Department showed the patient to be awake, alert and orientated. He was cooperative with the examination.  HEAD AND NECK: Showed evidence of weight loss with sunken cheek bones. No thyromegaly or cervical adenopathy.  CHEST: Clear to auscultation.  CARDIAC: Regular rhythm.  ABDOMEN: The abdomen was distended. Bowel sounds were present, but slightly decreased. There was no focal tenderness to palpation. No evidence of inguinal or femoral hernias by palpation. No abdominal masses.   LABORATORY STUDIES: Completed both at the time of his initial evaluation in the morning of September 6 and in the evening of September 6 showed a rise in his creatinine from 1.0 to 1.47, moderate hyponatremia, and a white count going from 13,600 to 17,600. Plain films showed multiple distended loops of bowel with air throughout to the transverse colon. CT scan had suggested cecal ileus.   The patient had a grocery bag full of medicines with him, but by his wife's report, he was only taking pantoprazole 40 mg daily and p.r.n. tramadol. She did not report that he was making use of any the inhalers noted in the hospital computer system.   MEDICAL ALLERGIES: CONSIST OF ACCUPRIL, LASIX, AND ALLEGRA.   IMPRESSION: An 80 year old male with a long history of irritable bowel syndrome, now with abdominal distention and vomiting. Past history of peptic ulcer disease.   The patient will  be treated with IV resuscitation, NG decompression and a PPI infusion. Serial hemoglobins will be followed and serial clinical examinations will be undertaken.    ____________________________ Robert Bellow, MD jwb:JT D: 06/02/2014  11:36:44 ET T: 06/02/2014 12:01:46 ET JOB#: [id number]  cc: Robert Bellow, MD, <Dictator> [name], MD J. Rochel Brome, MD Alyson Ki Amedeo Kinsman MD ELECTRONICALLY SIGNED 06/03/2014 9:50

## 2015-01-19 ENCOUNTER — Ambulatory Visit (INDEPENDENT_AMBULATORY_CARE_PROVIDER_SITE_OTHER): Payer: Medicare Other | Admitting: Podiatry

## 2015-01-19 DIAGNOSIS — B351 Tinea unguium: Secondary | ICD-10-CM

## 2015-01-19 DIAGNOSIS — M79676 Pain in unspecified toe(s): Secondary | ICD-10-CM | POA: Diagnosis not present

## 2015-01-19 NOTE — Progress Notes (Signed)
He presents today with chief complaint of painful elongated toenails that are incurvated..  Objective: Pulses are palpable nails are thick yellow dystrophic with mycotic and painful palpation.  Assessment: Pain in limb secondary to onychomycosis 1 through 5 bilateral.  Plan: Debridement of nails 1 through 5 bilateral cover service secondary to pain. 

## 2015-01-23 ENCOUNTER — Ambulatory Visit: Payer: Medicare Other | Admitting: Internal Medicine

## 2015-02-06 ENCOUNTER — Ambulatory Visit (INDEPENDENT_AMBULATORY_CARE_PROVIDER_SITE_OTHER): Payer: Medicare Other | Admitting: Internal Medicine

## 2015-02-06 ENCOUNTER — Encounter: Payer: Self-pay | Admitting: Internal Medicine

## 2015-02-06 VITALS — BP 138/78 | HR 90 | Temp 98.2°F | Ht 65.5 in | Wt 135.4 lb

## 2015-02-06 DIAGNOSIS — K589 Irritable bowel syndrome without diarrhea: Secondary | ICD-10-CM | POA: Diagnosis not present

## 2015-02-06 DIAGNOSIS — E119 Type 2 diabetes mellitus without complications: Secondary | ICD-10-CM

## 2015-02-06 DIAGNOSIS — Z125 Encounter for screening for malignant neoplasm of prostate: Secondary | ICD-10-CM

## 2015-02-06 DIAGNOSIS — R634 Abnormal weight loss: Secondary | ICD-10-CM

## 2015-02-06 DIAGNOSIS — Z860100 Personal history of colon polyps, unspecified: Secondary | ICD-10-CM

## 2015-02-06 DIAGNOSIS — L989 Disorder of the skin and subcutaneous tissue, unspecified: Secondary | ICD-10-CM

## 2015-02-06 DIAGNOSIS — R739 Hyperglycemia, unspecified: Secondary | ICD-10-CM | POA: Diagnosis not present

## 2015-02-06 DIAGNOSIS — R1084 Generalized abdominal pain: Secondary | ICD-10-CM

## 2015-02-06 DIAGNOSIS — I10 Essential (primary) hypertension: Secondary | ICD-10-CM | POA: Diagnosis not present

## 2015-02-06 DIAGNOSIS — Z0000 Encounter for general adult medical examination without abnormal findings: Secondary | ICD-10-CM

## 2015-02-06 MED ORDER — CLOTRIMAZOLE-BETAMETHASONE 1-0.05 % EX CREA: 1.0000 "application " | TOPICAL_CREAM | Freq: Two times a day (BID) | CUTANEOUS | Status: DC

## 2015-02-06 NOTE — Progress Notes (Signed)
Pre visit review using our clinic review tool, if applicable. No additional management support is needed unless otherwise documented below in the visit note. 

## 2015-02-06 NOTE — Progress Notes (Signed)
[name], male   DOB: 06-15-1930, 81 y.o.   MRN: [id number]   Subjective:    [name], male    DOB: 1929-12-11, 81 y.o.   MRN: [id number]  HPI  Patient here for a scheduled follow up.  He is accompanied by his wife.  History obtained from both of them.  His weight is coming up.  Wife reports eating better.  Discussed diet.  Is planning to have colonoscopy next week.  Seeing Dr Redmond Pulling at [facility].  Overall feels better.  Does have persistent skin lesion.  No itching.  No cardiac symptoms with increased activity or exertion.  Breathing stable.  Bowels stable.    Past Medical History  Diagnosis Date  . COPD with asthma   . Diabetes mellitus     Diet control   . Hyperlipidemia   . Carpal tunnel syndrome   . DJD (degenerative joint disease), cervical   . Vitamin D deficiency   . Tremor, essential   . Internal hemorrhoids   . Personal history of colonic polyps     adenomatous  . IBS (irritable bowel syndrome)   . Peptic ulcer   . Duodenal ulcer, with partial obstruction 08/10/2012  . Numbness and tingling in right hand     started 2 yeas ago  . Hypertension     no medicine needed  . Memory deficit 12/16/2013  . Essential and other specified forms of tremor 12/16/2013  . Polyneuropathy in diabetes(357.2)     Current Outpatient Prescriptions on File Prior to Visit  Medication Sig Dispense Refill  . amoxicillin (AMOXIL) 500 MG tablet Take 500 mg by mouth as needed. PRN for dental procedures    . Docusate Calcium (STOOL SOFTENER PO) Take by mouth.    Marland Kitchen ipratropium (ATROVENT) 0.06 % nasal spray Place 2 sprays into the nose daily as needed.     Marland Kitchen levothyroxine (SYNTHROID, LEVOTHROID) 50 MCG tablet Take 50 mcg by mouth daily before breakfast.    . loratadine (CLARITIN) 10 MG tablet Take 10 mg by mouth daily.    . mupirocin ointment (BACTROBAN) 2 % Apply to affected area bid 22 g 0  . pantoprazole (PROTONIX) 40 MG tablet TAKE 1 TABLET BY MOUTH ONCE  DAILY, 30-60MINUTES BEFORE A MEAL. 30 tablet 11  . Simethicone (GAS-X EXTRA STRENGTH PO) Take 1 tablet by mouth as needed.    . traMADol (ULTRAM) 50 MG tablet Take by mouth at bedtime as needed.    . Vitamin D, Ergocalciferol, (DRISDOL) 50000 UNITS CAPS Take 50,000 Units by mouth every 7 (seven) days. Friday.     No current facility-administered medications on file prior to visit.    Review of Systems  Constitutional: Negative for appetite change and unexpected weight change.       Has gained weight.    HENT: Negative for congestion and sinus pressure.   Respiratory: Negative for cough, chest tightness and shortness of breath.   Cardiovascular: Negative for chest pain, palpitations and leg swelling.  Gastrointestinal: Negative for nausea, vomiting and diarrhea.  Skin: Negative for color change and rash.  Neurological: Negative for dizziness, light-headedness and headaches.  Hematological: Negative for adenopathy. Does not bruise/bleed easily.  Psychiatric/Behavioral: Negative for dysphoric mood and agitation.       Objective:    Physical Exam  Constitutional: He appears well-developed and well-nourished. No distress.  HENT:  Nose: Nose normal.  Mouth/Throat: Oropharynx is clear and moist.  Neck: Neck supple. No thyromegaly present.  Cardiovascular: Normal rate and regular rhythm.   Pulmonary/Chest: Effort normal and breath sounds normal. No respiratory distress.  Abdominal: Soft. Bowel sounds are normal. There is no tenderness.  Musculoskeletal: He exhibits no edema or tenderness.  Lymphadenopathy:    He has no cervical adenopathy.  Skin: No erythema.  Small circular skin lesion.   Psychiatric: He has a normal mood and affect. His behavior is normal.    BP 138/78 mmHg  Pulse 90  Temp(Src) 98.2 F (36.8 C) (Oral)  Ht 5' 5.5" (1.664 m)  Wt 135 lb 6 oz (61.406 kg)  BMI 22.18 kg/m2  SpO2 96% Wt Readings from Last 3 Encounters:  02/06/15 135 lb 6 oz (61.406 kg)  09/23/14  127 lb 8 oz (57.834 kg)  06/19/14 123 lb 4 oz (55.906 kg)     Lab Results  Component Value Date   WBC 7.8 10/20/2014   HGB 14.0 10/20/2014   HCT 43.4 10/20/2014   PLT 283.0 10/20/2014   GLUCOSE 105* 02/06/2015   CHOL 216* 10/06/2014   TRIG 67.0 10/06/2014   HDL 94.10 10/06/2014   LDLCALC 109* 10/06/2014   ALT 12 02/06/2015   AST 20 02/06/2015   NA 137 02/06/2015   K 3.9 02/06/2015   CL 93* 02/06/2015   CREATININE 0.84 02/06/2015   BUN 11 02/06/2015   CO2 36* 02/06/2015   TSH 2.15 10/06/2014   PSA 0.83 02/06/2015   INR 0.94 11/06/2012   HGBA1C 6.1* 02/06/2015       Assessment & Plan:   Problem List Items Addressed This Visit    Abdominal pain    Better.  Seeing Dr Redmond Pulling.  Planning for colonoscopy next week.       Relevant Orders   Hepatic function panel (Completed)   Basic metabolic panel (Completed)   Diabetes mellitus, type II    A1c has been doing well.  Follow.   Lab Results  Component Value Date   HGBA1C 6.1* 02/06/2015        Health care maintenance    Physical 06/19/14.  Planning for colonoscopy next week.  Check psa today.       History of colonic polyps    Planning for colonoscopy next week.       Hypertension    Blood pressure on recheck better.  Follow.        Irritable bowel syndrome    Has had extensive GI w/up.  Seeing Dr Redmond Pulling.  Planning for colonoscopy next week.  appetite better.  Weight is coming up.       Loss of weight    Is gaining.  Follow.  Eating better.       Skin lesion    lotrison cream as directed.         Other Visit Diagnoses    Prostate cancer screening    -  Primary    Relevant Orders    PSA, Medicare (Completed)    Hyperglycemia        Relevant Orders    Hemoglobin A1c (Completed)      I spent 25 minutes with the patient and more than 50% of the time was spent in consultation regarding the above.     [name], MD

## 2015-02-09 ENCOUNTER — Other Ambulatory Visit: Payer: TRICARE For Life (TFL)

## 2015-02-10 ENCOUNTER — Encounter: Payer: Self-pay | Admitting: *Deleted

## 2015-02-10 LAB — HEPATIC FUNCTION PANEL
ALK PHOS: 55 U/L (ref 39–117)
ALT: 12 U/L (ref 0–53)
AST: 20 U/L (ref 0–37)
Albumin: 3.7 g/dL (ref 3.5–5.2)
BILIRUBIN DIRECT: 0.1 mg/dL (ref 0.0–0.3)
Indirect Bilirubin: 0.4 mg/dL (ref 0.2–1.2)
Total Bilirubin: 0.5 mg/dL (ref 0.2–1.2)
Total Protein: 6.1 g/dL (ref 6.0–8.3)

## 2015-02-10 LAB — BASIC METABOLIC PANEL
BUN: 11 mg/dL (ref 6–23)
CHLORIDE: 93 meq/L — AB (ref 96–112)
CO2: 36 meq/L — AB (ref 19–32)
Calcium: 9.3 mg/dL (ref 8.4–10.5)
Creat: 0.84 mg/dL (ref 0.50–1.35)
Glucose, Bld: 105 mg/dL — ABNORMAL HIGH (ref 70–99)
Potassium: 3.9 mEq/L (ref 3.5–5.3)
Sodium: 137 mEq/L (ref 135–145)

## 2015-02-10 LAB — HEMOGLOBIN A1C
Hgb A1c MFr Bld: 6.1 % — ABNORMAL HIGH (ref <5.7)
Mean Plasma Glucose: 128 mg/dL — ABNORMAL HIGH (ref <117)

## 2015-02-10 LAB — PSA, MEDICARE: PSA: 0.83 ng/mL (ref <=4.00)

## 2015-02-10 LAB — HM COLONOSCOPY

## 2015-02-11 ENCOUNTER — Encounter: Payer: Self-pay | Admitting: Internal Medicine

## 2015-02-16 ENCOUNTER — Encounter: Payer: Self-pay | Admitting: Internal Medicine

## 2015-02-16 DIAGNOSIS — L989 Disorder of the skin and subcutaneous tissue, unspecified: Secondary | ICD-10-CM | POA: Insufficient documentation

## 2015-02-16 DIAGNOSIS — Z7189 Other specified counseling: Secondary | ICD-10-CM | POA: Insufficient documentation

## 2015-02-16 NOTE — Assessment & Plan Note (Signed)
A1c has been doing well.  Follow.   Lab Results  Component Value Date   HGBA1C 6.1* 02/06/2015

## 2015-02-16 NOTE — Assessment & Plan Note (Signed)
Blood pressure on recheck better.  Follow.

## 2015-02-16 NOTE — Assessment & Plan Note (Signed)
lotrison cream as directed.

## 2015-02-16 NOTE — Assessment & Plan Note (Signed)
Has had extensive GI w/up.  Seeing Dr Redmond Pulling.  Planning for colonoscopy next week.  appetite better.  Weight is coming up.

## 2015-02-16 NOTE — Assessment & Plan Note (Signed)
Better.  Seeing Dr Redmond Pulling.  Planning for colonoscopy next week.

## 2015-02-16 NOTE — Assessment & Plan Note (Signed)
Planning for colonoscopy next week.

## 2015-02-16 NOTE — Assessment & Plan Note (Signed)
Physical 06/19/14.  Planning for colonoscopy next week.  Check psa today.

## 2015-02-16 NOTE — Assessment & Plan Note (Signed)
Is gaining.  Follow.  Eating better.

## 2015-02-24 ENCOUNTER — Encounter: Payer: Self-pay | Admitting: *Deleted

## 2015-03-11 ENCOUNTER — Telehealth: Payer: Self-pay | Admitting: Internal Medicine

## 2015-03-11 NOTE — Telephone Encounter (Signed)
Placed in quick sign folder.  

## 2015-03-11 NOTE — Telephone Encounter (Signed)
Form signed and placed in your box.

## 2015-03-11 NOTE — Telephone Encounter (Signed)
Pt dropped off handicap parking form. Form in Dr. Bary Leriche office/msn

## 2015-04-20 ENCOUNTER — Other Ambulatory Visit: Payer: Self-pay

## 2015-04-20 MED ORDER — CLOTRIMAZOLE-BETAMETHASONE 1-0.05 % EX CREA: 1.0000 "application " | TOPICAL_CREAM | Freq: Two times a day (BID) | CUTANEOUS | Status: DC

## 2015-04-20 NOTE — Telephone Encounter (Signed)
Refilled lotrisone x 1

## 2015-04-20 NOTE — Telephone Encounter (Signed)
Patient walked in, states that the medication you prescribed is helping some improvement but the condition is still the same size, would like a refill of the cream.  Please advise?

## 2015-05-01 ENCOUNTER — Other Ambulatory Visit: Payer: Self-pay | Admitting: Internal Medicine

## 2015-05-01 DIAGNOSIS — L989 Disorder of the skin and subcutaneous tissue, unspecified: Secondary | ICD-10-CM

## 2015-05-01 NOTE — Progress Notes (Signed)
Order placed for dermatology referral.  

## 2015-05-04 ENCOUNTER — Ambulatory Visit: Payer: TRICARE For Life (TFL)

## 2015-06-02 ENCOUNTER — Other Ambulatory Visit: Payer: Self-pay

## 2015-06-02 MED ORDER — PANTOPRAZOLE SODIUM 40 MG PO TBEC
DELAYED_RELEASE_TABLET | ORAL | Status: DC
Start: 2015-06-02 — End: 2015-07-15

## 2015-06-03 ENCOUNTER — Ambulatory Visit: Payer: TRICARE For Life (TFL)

## 2015-06-04 ENCOUNTER — Ambulatory Visit (INDEPENDENT_AMBULATORY_CARE_PROVIDER_SITE_OTHER): Payer: Medicare Other | Admitting: Podiatry

## 2015-06-04 DIAGNOSIS — M79676 Pain in unspecified toe(s): Secondary | ICD-10-CM

## 2015-06-04 DIAGNOSIS — B351 Tinea unguium: Secondary | ICD-10-CM

## 2015-06-04 NOTE — Progress Notes (Signed)
Subjective: 81 y.o. returns the office today for painful, elongated, thickened toenails which he is unable to trim himself. Denies any redness or drainage around the nails. Denies any acute changes since last appointment and no new complaints today. Denies any systemic complaints such as fevers, chills, nausea, vomiting.   Objective: AAO 3, NAD DP/PT pulses palpable, CRT less than 3 seconds Nails hypertrophic, dystrophic, elongated, brittle, discolored 10. There is tenderness overlying the nails 1-5 bilaterally. There is no surrounding erythema or drainage along the nail sites. No open lesions or pre-ulcerative lesions are identified. No other areas of tenderness bilateral lower extremities. No overlying edema, erythema, increased warmth. No pain with calf compression, swelling, warmth, erythema.  Assessment: Patient presents with symptomatic onychomycosis  Plan: -Treatment options including alternatives, risks, complications were discussed -Nails sharply debrided 10 without complication/bleeding. -Discussed daily foot inspection. If there are any changes, to call the office immediately.  -Follow-up in 3 months or sooner if any problems are to arise. In the meantime, encouraged to call the office with any questions, concerns, changes symptoms.  Celesta Gentile, DPM

## 2015-06-10 ENCOUNTER — Encounter: Payer: Self-pay | Admitting: Internal Medicine

## 2015-06-10 ENCOUNTER — Ambulatory Visit (INDEPENDENT_AMBULATORY_CARE_PROVIDER_SITE_OTHER): Payer: TRICARE For Life (TFL) | Admitting: Internal Medicine

## 2015-06-10 VITALS — BP 130/80 | HR 85 | Temp 98.4°F | Ht 64.0 in | Wt 141.5 lb

## 2015-06-10 DIAGNOSIS — Z0000 Encounter for general adult medical examination without abnormal findings: Secondary | ICD-10-CM | POA: Diagnosis not present

## 2015-06-10 DIAGNOSIS — Z23 Encounter for immunization: Secondary | ICD-10-CM | POA: Diagnosis not present

## 2015-06-10 DIAGNOSIS — E119 Type 2 diabetes mellitus without complications: Secondary | ICD-10-CM

## 2015-06-10 DIAGNOSIS — Z86010 Personal history of colonic polyps: Secondary | ICD-10-CM

## 2015-06-10 DIAGNOSIS — K589 Irritable bowel syndrome without diarrhea: Secondary | ICD-10-CM

## 2015-06-10 DIAGNOSIS — I10 Essential (primary) hypertension: Secondary | ICD-10-CM

## 2015-06-10 NOTE — Progress Notes (Signed)
[name], male   DOB: 06/06/1930, 81 y.o.   MRN: [id number]   Subjective:    [name], male    DOB: 05-Apr-1930, 81 y.o.   MRN: [id number]  HPI  Patient here to follow up regarding his weight and previous bowel issues.  He is eating better.  Appetite is better.  No chest pain or tightness.  No sob.  No acid reflux.  No abdominal pain or cramping.  No urinary issues.  Bowels stable.  Had skin lesion removed from scalp.  Healing.  Weight is up.    Past Medical History  Diagnosis Date  . COPD with asthma   . Diabetes mellitus     Diet control   . Hyperlipidemia   . Carpal tunnel syndrome   . DJD (degenerative joint disease), cervical   . Vitamin D deficiency   . Tremor, essential   . Internal hemorrhoids   . Personal history of colonic polyps     adenomatous  . IBS (irritable bowel syndrome)   . Peptic ulcer   . Duodenal ulcer, with partial obstruction 08/10/2012  . Numbness and tingling in right hand     started 2 yeas ago  . Hypertension     no medicine needed  . Memory deficit 12/16/2013  . Essential and other specified forms of tremor 12/16/2013  . Polyneuropathy in diabetes(357.2)    Past Surgical History  Procedure Laterality Date  . Anal fissure repair    . Colonoscopy w/ biopsies and polypectomy  8/03, 6/05, 7/09, 9/10    internal hemorrhoids, tubular adenomas, mucosa & lymphoid nodules  . Upper gastrointestinal endoscopy  3/05, 7/09, 9/10,2013    gastritis, duodenitis  . Total hip arthroplasty Right 11/13/2012    Procedure: TOTAL HIP ARTHROPLASTY ANTERIOR APPROACH;  Surgeon: Mcarthur Rossetti, MD;  Location: [facility];  Service: Orthopedics;  Laterality: Right;  Right total hip arthroplasty  . Ankle surgery Right     right- pins placed in   Family History  Problem Relation Age of Onset  . Colon cancer Neg Hx   . Esophageal cancer Neg Hx   . Rectal cancer Neg Hx   . Stomach cancer Neg Hx   . Leukemia Maternal Aunt   .  Thyroid cancer Daughter 48  . Diabetes Son    Social History   Social History  . Marital Status: Married    Spouse Name: N/A  . Number of Children: 3  . 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 Smoking status: Former Smoker    Types: Cigarettes    Quit date: 07/02/1963  . Smokeless tobacco: Never Used  . Alcohol Use: 0.0 oz/week    0 Standard drinks or equivalent per week     Comment: occasional  . Drug Use: No  . Sexual Activity: Not Asked   Other Topics Concern  . None   Social History Narrative   Veteran [employer]    Outpatient Encounter Prescriptions as of 06/10/2015  Medication Sig  . clotrimazole-betamethasone (LOTRISONE) cream Apply 1 application topically 2 (two) times daily.  Mariane Baumgarten Calcium (STOOL SOFTENER PO) Take by mouth.  Marland Kitchen ipratropium (ATROVENT) 0.06 % nasal spray Place 2 sprays into the nose daily as needed.   Marland Kitchen levothyroxine (SYNTHROID, LEVOTHROID) 50 MCG tablet Take 50 mcg by mouth daily before breakfast.  . loratadine (CLARITIN) 10 MG  tablet Take 10 mg by mouth daily.  . mupirocin ointment (BACTROBAN) 2 % Apply to affected area bid  . pantoprazole (PROTONIX) 40 MG tablet TAKE 1 TABLET BY MOUTH ONCE DAILY, 30-60MINUTES BEFORE A MEAL.  Marland Kitchen saccharomyces boulardii (FLORASTOR) 250 MG capsule Take by mouth.  . Simethicone (GAS-X EXTRA STRENGTH PO) Take 1 tablet by mouth as needed.  . traMADol (ULTRAM) 50 MG tablet Take by mouth at bedtime as needed.  . Vitamin D, Ergocalciferol, (DRISDOL) 50000 UNITS CAPS Take 50,000 Units by mouth every 7 (seven) days. Friday.  Marland Kitchen amoxicillin (AMOXIL) 500 MG tablet Take 500 mg by mouth as needed. PRN for dental procedures   No facility-administered encounter medications on file as of 06/10/2015.    Review of Systems  Constitutional: Negative for appetite change and unexpected weight change.  HENT: Negative for congestion and sinus pressure.     Eyes: Negative for pain and visual disturbance.  Respiratory: Negative for cough, chest tightness and shortness of breath.   Cardiovascular: Negative for chest pain, palpitations and leg swelling.  Gastrointestinal: Negative for nausea, vomiting, abdominal pain and diarrhea.  Genitourinary: Negative for dysuria and difficulty urinating.  Musculoskeletal: Negative for back pain and joint swelling.  Skin: Negative for color change and rash.  Neurological: Negative for dizziness, light-headedness and headaches.  Hematological: Negative for adenopathy. Does not bruise/bleed easily.  Psychiatric/Behavioral: Negative for dysphoric mood and agitation.       Objective:    Physical Exam  Constitutional: He is oriented to person, place, and time. He appears well-developed and well-nourished. No distress.  HENT:  Head: Normocephalic and atraumatic.  Nose: Nose normal.  Mouth/Throat: Oropharynx is clear and moist. No oropharyngeal exudate.  Eyes: Conjunctivae are normal. Right eye exhibits no discharge. Left eye exhibits no discharge.  Neck: Neck supple. No thyromegaly present.  Cardiovascular: Normal rate and regular rhythm.   Pulmonary/Chest: Breath sounds normal. No respiratory distress. He has no wheezes.  Abdominal: Soft. Bowel sounds are normal. There is no tenderness.  Genitourinary:  Rectal exam - no palpable prostate nodules.  Heme negative.    Musculoskeletal: He exhibits no edema or tenderness.  Lymphadenopathy:    He has no cervical adenopathy.  Neurological: He is alert and oriented to person, place, and time.  Skin: Skin is warm and dry. No rash noted. No erythema.  Psychiatric: He has a normal mood and affect. His behavior is normal.    BP 130/80 mmHg  Pulse 85  Temp(Src) 98.4 F (36.9 C) (Oral)  Ht 5' 4"  (1.626 m)  Wt 141 lb 8 oz (64.184 kg)  BMI 24.28 kg/m2  SpO2 98% Wt Readings from Last 3 Encounters:  06/10/15 141 lb 8 oz (64.184 kg)  02/06/15 135 lb 6 oz  (61.406 kg)  09/23/14 127 lb 8 oz (57.834 kg)     Lab Results  Component Value Date   WBC 7.8 10/20/2014   HGB 14.0 10/20/2014   HCT 43.4 10/20/2014   PLT 283.0 10/20/2014   GLUCOSE 105* 02/06/2015   CHOL 216* 10/06/2014   TRIG 67.0 10/06/2014   HDL 94.10 10/06/2014   LDLCALC 109* 10/06/2014   ALT 12 02/06/2015   AST 20 02/06/2015   NA 137 02/06/2015   K 3.9 02/06/2015   CL 93* 02/06/2015   CREATININE 0.84 02/06/2015   BUN 11 02/06/2015   CO2 36* 02/06/2015   TSH 2.15 10/06/2014   PSA 0.83 02/06/2015   INR 0.94 11/06/2012   HGBA1C 6.1* 02/06/2015  Assessment & Plan:   Problem List Items Addressed This Visit    Diabetes mellitus, type II    Sugars have been doing well.  Follow met b and a1c.        Relevant Orders   Hemoglobin A1c   Microalbumin / creatinine urine ratio   Health care maintenance    Physical today 06/10/15.  Colonoscopy 01/2015.  02/06/15 psa - .83         History of colonic polyps    Colonoscopy 02/10/15 as outlined.        Hypertension    Blood pressure under good control.  Continue same medication regimen.  Follow pressures.  Follow metabolic panel.        Relevant Orders   Lipid panel   Hepatic function panel   Basic metabolic panel   Irritable bowel syndrome    Has had extensive GI w/up.  Just had colonoscopy as outlined.  Seeing Dr Redmond Pulling.  Weight has improved.  Follow.       Relevant Medications   saccharomyces boulardii (FLORASTOR) 250 MG capsule    Other Visit Diagnoses    Encounter for immunization    -  Primary    Routine general medical examination at a health care facility            [name], MD

## 2015-06-10 NOTE — Progress Notes (Signed)
Pre-visit discussion using our clinic review tool. No additional management support is needed unless otherwise documented below in the visit note.  

## 2015-06-13 ENCOUNTER — Encounter: Payer: Self-pay | Admitting: Internal Medicine

## 2015-06-13 NOTE — Assessment & Plan Note (Signed)
Has had extensive GI w/up.  Just had colonoscopy as outlined.  Seeing Dr Redmond Pulling.  Weight has improved.  Follow.

## 2015-06-13 NOTE — Assessment & Plan Note (Addendum)
Physical today 06/10/15.  Colonoscopy 01/2015.  02/06/15 psa - .83

## 2015-06-13 NOTE — Assessment & Plan Note (Signed)
Blood pressure under good control.  Continue same medication regimen.  Follow pressures.  Follow metabolic panel.   

## 2015-06-13 NOTE — Assessment & Plan Note (Signed)
Sugars have been doing well.  Follow met b and a1c.   

## 2015-06-13 NOTE — Assessment & Plan Note (Signed)
Colonoscopy 02/10/15 as outlined.

## 2015-06-24 ENCOUNTER — Other Ambulatory Visit (INDEPENDENT_AMBULATORY_CARE_PROVIDER_SITE_OTHER): Payer: TRICARE For Life (TFL)

## 2015-06-24 ENCOUNTER — Other Ambulatory Visit: Payer: Self-pay | Admitting: *Deleted

## 2015-06-24 DIAGNOSIS — E119 Type 2 diabetes mellitus without complications: Secondary | ICD-10-CM

## 2015-06-24 DIAGNOSIS — I10 Essential (primary) hypertension: Secondary | ICD-10-CM

## 2015-06-24 LAB — BASIC METABOLIC PANEL
BUN: 11 mg/dL (ref 6–23)
CHLORIDE: 79 meq/L — AB (ref 96–112)
CO2: 38 mEq/L — ABNORMAL HIGH (ref 19–32)
Calcium: 9.3 mg/dL (ref 8.4–10.5)
Creatinine, Ser: 0.78 mg/dL (ref 0.40–1.50)
GFR: 122.3 mL/min (ref >60.00)
Glucose, Bld: 118 mg/dL — ABNORMAL HIGH (ref 70–99)
POTASSIUM: 4.3 meq/L (ref 3.5–5.1)
SODIUM: 123 meq/L — AB (ref 135–145)

## 2015-06-24 LAB — HEPATIC FUNCTION PANEL
ALBUMIN: 4 g/dL (ref 3.5–5.2)
ALK PHOS: 75 U/L (ref 39–117)
ALT: 12 U/L (ref 0–53)
AST: 23 U/L (ref 0–37)
BILIRUBIN DIRECT: 0.1 mg/dL (ref 0.0–0.3)
Total Bilirubin: 0.6 mg/dL (ref 0.2–1.2)
Total Protein: 6.8 g/dL (ref 6.0–8.3)

## 2015-06-24 LAB — LIPID PANEL
CHOL/HDL RATIO: 2
Cholesterol: 223 mg/dL — ABNORMAL HIGH (ref 0–200)
HDL: 96.1 mg/dL (ref >39.00)
LDL Cholesterol: 116 mg/dL — ABNORMAL HIGH (ref 0–99)
NONHDL: 127.04
Triglycerides: 53 mg/dL (ref 0.0–149.0)
VLDL: 10.6 mg/dL (ref 0.0–40.0)

## 2015-06-24 LAB — MICROALBUMIN / CREATININE URINE RATIO
Creatinine,U: 115.9 mg/dL
MICROALB UR: 93.8 mg/dL — AB (ref 0.0–1.9)
MICROALB/CREAT RATIO: 80.9 mg/g — AB (ref 0.0–30.0)

## 2015-06-24 LAB — HEMOGLOBIN A1C: Hgb A1c MFr Bld: 6.2 % (ref 4.6–6.5)

## 2015-06-25 ENCOUNTER — Emergency Department: Payer: Medicare Other

## 2015-06-25 ENCOUNTER — Encounter: Payer: Self-pay | Admitting: Emergency Medicine

## 2015-06-25 ENCOUNTER — Other Ambulatory Visit (INDEPENDENT_AMBULATORY_CARE_PROVIDER_SITE_OTHER): Payer: TRICARE For Life (TFL)

## 2015-06-25 ENCOUNTER — Other Ambulatory Visit: Payer: Self-pay | Admitting: Internal Medicine

## 2015-06-25 ENCOUNTER — Inpatient Hospital Stay
Admission: EM | Admit: 2015-06-25 | Discharge: 2015-07-06 | DRG: 207 | Disposition: A | Discharge status: Home / Self Care | Payer: Medicare Other | Attending: Internal Medicine | Admitting: Internal Medicine

## 2015-06-25 DIAGNOSIS — E222 Syndrome of inappropriate secretion of antidiuretic hormone: Secondary | ICD-10-CM | POA: Diagnosis present

## 2015-06-25 DIAGNOSIS — R0600 Dyspnea, unspecified: Secondary | ICD-10-CM

## 2015-06-25 DIAGNOSIS — J9622 Acute and chronic respiratory failure with hypercapnia: Secondary | ICD-10-CM | POA: Diagnosis present

## 2015-06-25 DIAGNOSIS — K567 Ileus, unspecified: Secondary | ICD-10-CM | POA: Diagnosis present

## 2015-06-25 DIAGNOSIS — J441 Chronic obstructive pulmonary disease with (acute) exacerbation: Secondary | ICD-10-CM | POA: Diagnosis not present

## 2015-06-25 DIAGNOSIS — F0390 Unspecified dementia without behavioral disturbance: Secondary | ICD-10-CM | POA: Diagnosis present

## 2015-06-25 DIAGNOSIS — Z87891 Personal history of nicotine dependence: Secondary | ICD-10-CM

## 2015-06-25 DIAGNOSIS — M1990 Unspecified osteoarthritis, unspecified site: Secondary | ICD-10-CM | POA: Diagnosis present

## 2015-06-25 DIAGNOSIS — J9621 Acute and chronic respiratory failure with hypoxia: Secondary | ICD-10-CM | POA: Diagnosis present

## 2015-06-25 DIAGNOSIS — Z91040 Latex allergy status: Secondary | ICD-10-CM | POA: Diagnosis not present

## 2015-06-25 DIAGNOSIS — G9340 Encephalopathy, unspecified: Secondary | ICD-10-CM | POA: Diagnosis present

## 2015-06-25 DIAGNOSIS — G92 Toxic encephalopathy: Secondary | ICD-10-CM | POA: Diagnosis not present

## 2015-06-25 DIAGNOSIS — Z79899 Other long term (current) drug therapy: Secondary | ICD-10-CM | POA: Diagnosis not present

## 2015-06-25 DIAGNOSIS — R531 Weakness: Secondary | ICD-10-CM

## 2015-06-25 DIAGNOSIS — Z888 Allergy status to other drugs, medicaments and biological substances status: Secondary | ICD-10-CM

## 2015-06-25 DIAGNOSIS — E440 Moderate protein-calorie malnutrition: Secondary | ICD-10-CM | POA: Insufficient documentation

## 2015-06-25 DIAGNOSIS — Z808 Family history of malignant neoplasm of other organs or systems: Secondary | ICD-10-CM

## 2015-06-25 DIAGNOSIS — Z8711 Personal history of peptic ulcer disease: Secondary | ICD-10-CM

## 2015-06-25 DIAGNOSIS — J440 Chronic obstructive pulmonary disease with acute lower respiratory infection: Secondary | ICD-10-CM | POA: Diagnosis present

## 2015-06-25 DIAGNOSIS — I10 Essential (primary) hypertension: Secondary | ICD-10-CM | POA: Diagnosis present

## 2015-06-25 DIAGNOSIS — E871 Hypo-osmolality and hyponatremia: Secondary | ICD-10-CM

## 2015-06-25 DIAGNOSIS — C3490 Malignant neoplasm of unspecified part of unspecified bronchus or lung: Secondary | ICD-10-CM | POA: Diagnosis present

## 2015-06-25 DIAGNOSIS — J45909 Unspecified asthma, uncomplicated: Secondary | ICD-10-CM | POA: Diagnosis present

## 2015-06-25 DIAGNOSIS — R911 Solitary pulmonary nodule: Secondary | ICD-10-CM | POA: Diagnosis not present

## 2015-06-25 DIAGNOSIS — R0602 Shortness of breath: Secondary | ICD-10-CM | POA: Diagnosis not present

## 2015-06-25 DIAGNOSIS — J9601 Acute respiratory failure with hypoxia: Secondary | ICD-10-CM | POA: Diagnosis not present

## 2015-06-25 DIAGNOSIS — E872 Acidosis: Secondary | ICD-10-CM | POA: Diagnosis present

## 2015-06-25 DIAGNOSIS — J9620 Acute and chronic respiratory failure, unspecified whether with hypoxia or hypercapnia: Secondary | ICD-10-CM | POA: Diagnosis not present

## 2015-06-25 DIAGNOSIS — Z4659 Encounter for fitting and adjustment of other gastrointestinal appliance and device: Secondary | ICD-10-CM

## 2015-06-25 DIAGNOSIS — Z833 Family history of diabetes mellitus: Secondary | ICD-10-CM

## 2015-06-25 DIAGNOSIS — J9602 Acute respiratory failure with hypercapnia: Secondary | ICD-10-CM | POA: Diagnosis not present

## 2015-06-25 DIAGNOSIS — J449 Chronic obstructive pulmonary disease, unspecified: Secondary | ICD-10-CM | POA: Diagnosis not present

## 2015-06-25 DIAGNOSIS — Z806 Family history of leukemia: Secondary | ICD-10-CM

## 2015-06-25 DIAGNOSIS — E876 Hypokalemia: Secondary | ICD-10-CM | POA: Diagnosis present

## 2015-06-25 DIAGNOSIS — Z86010 Personal history of colonic polyps: Secondary | ICD-10-CM | POA: Diagnosis not present

## 2015-06-25 DIAGNOSIS — K589 Irritable bowel syndrome without diarrhea: Secondary | ICD-10-CM | POA: Diagnosis present

## 2015-06-25 DIAGNOSIS — R34 Anuria and oliguria: Secondary | ICD-10-CM | POA: Diagnosis not present

## 2015-06-25 DIAGNOSIS — J209 Acute bronchitis, unspecified: Secondary | ICD-10-CM | POA: Diagnosis present

## 2015-06-25 DIAGNOSIS — J189 Pneumonia, unspecified organism: Secondary | ICD-10-CM | POA: Diagnosis present

## 2015-06-25 DIAGNOSIS — K219 Gastro-esophageal reflux disease without esophagitis: Secondary | ICD-10-CM | POA: Diagnosis present

## 2015-06-25 DIAGNOSIS — Z515 Encounter for palliative care: Secondary | ICD-10-CM | POA: Diagnosis not present

## 2015-06-25 DIAGNOSIS — Z9889 Other specified postprocedural states: Secondary | ICD-10-CM

## 2015-06-25 DIAGNOSIS — E1142 Type 2 diabetes mellitus with diabetic polyneuropathy: Secondary | ICD-10-CM | POA: Diagnosis present

## 2015-06-25 DIAGNOSIS — E039 Hypothyroidism, unspecified: Secondary | ICD-10-CM | POA: Diagnosis present

## 2015-06-25 DIAGNOSIS — R918 Other nonspecific abnormal finding of lung field: Secondary | ICD-10-CM | POA: Diagnosis not present

## 2015-06-25 DIAGNOSIS — Z96641 Presence of right artificial hip joint: Secondary | ICD-10-CM | POA: Diagnosis present

## 2015-06-25 DIAGNOSIS — Z9911 Dependence on respirator [ventilator] status: Secondary | ICD-10-CM | POA: Diagnosis not present

## 2015-06-25 DIAGNOSIS — R4182 Altered mental status, unspecified: Secondary | ICD-10-CM

## 2015-06-25 DIAGNOSIS — J9612 Chronic respiratory failure with hypercapnia: Secondary | ICD-10-CM | POA: Diagnosis not present

## 2015-06-25 LAB — CBC
HCT: 42.1 % (ref 40.0–52.0)
HEMOGLOBIN: 13.7 g/dL (ref 13.0–18.0)
MCH: 30.5 pg (ref 26.0–34.0)
MCHC: 32.6 g/dL (ref 32.0–36.0)
MCV: 93.6 fL (ref 80.0–100.0)
PLATELETS: 284 10*3/uL (ref 150–440)
RBC: 4.5 MIL/uL (ref 4.40–5.90)
RDW: 12.7 % (ref 11.5–14.5)
WBC: 10.2 10*3/uL (ref 3.8–10.6)

## 2015-06-25 LAB — URINALYSIS COMPLETE WITH MICROSCOPIC (ARMC ONLY)
BILIRUBIN URINE: NEGATIVE
Bacteria, UA: NONE SEEN
Glucose, UA: 150 mg/dL — AB
KETONES UR: NEGATIVE mg/dL
LEUKOCYTES UA: NEGATIVE
Nitrite: NEGATIVE
PH: 6 (ref 5.0–8.0)
PROTEIN: 100 mg/dL — AB
SPECIFIC GRAVITY, URINE: 1.049 — AB (ref 1.005–1.030)

## 2015-06-25 LAB — SODIUM
SODIUM: 122 meq/L — AB (ref 135–145)
Sodium: 123 mmol/L — ABNORMAL LOW (ref 135–145)

## 2015-06-25 LAB — BASIC METABOLIC PANEL
ANION GAP: 10 (ref 5–15)
BUN: 12 mg/dL (ref 6–20)
CALCIUM: 8.3 mg/dL — AB (ref 8.9–10.3)
CO2: 31 mmol/L (ref 22–32)
CREATININE: 0.85 mg/dL (ref 0.61–1.24)
Chloride: 77 mmol/L — ABNORMAL LOW (ref 101–111)
Glucose, Bld: 237 mg/dL — ABNORMAL HIGH (ref 65–99)
Potassium: 4.2 mmol/L (ref 3.5–5.1)
SODIUM: 118 mmol/L — AB (ref 135–145)

## 2015-06-25 LAB — OSMOLALITY: OSMOLALITY: 256 mosm/kg — AB (ref 275–295)

## 2015-06-25 LAB — TROPONIN I

## 2015-06-25 LAB — OSMOLALITY, URINE: Osmolality, Ur: 514 mOsm/kg (ref 300–900)

## 2015-06-25 LAB — SODIUM, URINE, RANDOM: Sodium, Ur: 14 mmol/L

## 2015-06-25 MED ORDER — ONDANSETRON HCL 4 MG/2ML IJ SOLN
4.0000 mg | Freq: Four times a day (QID) | INTRAMUSCULAR | Status: DC | PRN
  Administered 2015-07-01: 4 mg via INTRAVENOUS
  Filled 2015-06-25: qty 2

## 2015-06-25 MED ORDER — ENOXAPARIN SODIUM 40 MG/0.4ML ~~LOC~~ SOLN
40.0000 mg | SUBCUTANEOUS | Status: DC
  Administered 2015-06-25 – 2015-07-05 (×11): 40 mg via SUBCUTANEOUS
  Filled 2015-06-25 (×11): qty 0.4

## 2015-06-25 MED ORDER — DEXTROSE 5 % IV SOLN
250.0000 mg | INTRAVENOUS | Status: DC
  Administered 2015-06-25: 23:00:00 250 mg via INTRAVENOUS
  Filled 2015-06-25 (×2): qty 250

## 2015-06-25 MED ORDER — ACETAMINOPHEN 325 MG PO TABS
650.0000 mg | ORAL_TABLET | Freq: Four times a day (QID) | ORAL | Status: DC | PRN
  Administered 2015-07-01: 650 mg via ORAL
  Filled 2015-06-25: qty 2

## 2015-06-25 MED ORDER — SODIUM CHLORIDE 0.9 % IV SOLN
INTRAVENOUS | Status: DC
  Administered 2015-06-25 – 2015-06-26 (×2): via INTRAVENOUS

## 2015-06-25 MED ORDER — PANTOPRAZOLE SODIUM 40 MG PO TBEC: 40.0000 mg | DELAYED_RELEASE_TABLET | Freq: Every day | ORAL | Status: DC

## 2015-06-25 MED ORDER — PNEUMOCOCCAL VAC POLYVALENT 25 MCG/0.5ML IJ INJ: 0.5000 mL | INJECTION | INTRAMUSCULAR | Status: DC

## 2015-06-25 MED ORDER — LEVOTHYROXINE SODIUM 50 MCG PO TABS
50.0000 ug | ORAL_TABLET | Freq: Every day | ORAL | Status: DC
  Administered 2015-06-27 – 2015-07-06 (×10): 50 ug via ORAL
  Filled 2015-06-25 (×11): qty 1

## 2015-06-25 MED ORDER — LORATADINE 10 MG PO TABS
10.0000 mg | ORAL_TABLET | Freq: Every day | ORAL | Status: DC
  Administered 2015-06-27: 10 mg via ORAL
  Filled 2015-06-25: qty 1

## 2015-06-25 MED ORDER — IPRATROPIUM BROMIDE 0.06 % NA SOLN: 2.0000 | Freq: Every day | NASAL | Status: DC | PRN

## 2015-06-25 MED ORDER — SODIUM CHLORIDE 0.9 % IJ SOLN
3.0000 mL | Freq: Two times a day (BID) | INTRAMUSCULAR | Status: DC
  Administered 2015-06-26 – 2015-07-06 (×11): 3 mL via INTRAVENOUS

## 2015-06-25 MED ORDER — LEVOFLOXACIN IN D5W 750 MG/150ML IV SOLN
750.0000 mg | Freq: Once | INTRAVENOUS | Status: AC
  Administered 2015-06-25: 750 mg via INTRAVENOUS
  Filled 2015-06-25: qty 150

## 2015-06-25 MED ORDER — ACETAMINOPHEN 650 MG RE SUPP: 650.0000 mg | Freq: Four times a day (QID) | RECTAL | Status: DC | PRN

## 2015-06-25 MED ORDER — ONDANSETRON HCL 4 MG PO TABS
4.0000 mg | ORAL_TABLET | Freq: Four times a day (QID) | ORAL | Status: DC | PRN
Start: 2015-06-25 — End: 2015-07-06

## 2015-06-25 MED ORDER — IOHEXOL 300 MG/ML  SOLN
75.0000 mL | Freq: Once | INTRAMUSCULAR | Status: AC | PRN
  Administered 2015-06-25: 75 mL via INTRAVENOUS

## 2015-06-25 NOTE — ED Notes (Signed)
Called report to [place] on 1C at this time.

## 2015-06-25 NOTE — ED Notes (Signed)
Sent in from md's office with weakness and low sodium

## 2015-06-25 NOTE — ED Notes (Signed)
Pt sent over for further eval of reported low sodium. Pt reports weakness over the past 2-3 days.

## 2015-06-25 NOTE — H&P (Signed)
[facility] NAME: Kimmie Doren    MR#:  [id number]  DATE OF BIRTH:  13-Oct-1929  DATE OF ADMISSION:  06/25/2015  PRIMARY CARE PHYSICIAN: [name], MD   REQUESTING/REFERRING PHYSICIAN: Dr. [name]  CHIEF COMPLAINT:   Chief Complaint  Patient presents with  . Abnormal Lab    HISTORY OF PRESENT ILLNESS:  Dilraj Killgore  is a 81 y.o. male with a known history of COPD, irritable bowel syndrome, hypertension, mild dementia presents from home secondary to altered mental status. Patient is lethargic at this time and most of the history is obtained from wife at bedside. According to wife patient just had a physical exam done by his PCP about 10 days ago, 2 days ago they had his labs done and his PCP called today to repeat his sodium as it was very low on the labs. It was 123 yesterday and repeat sodium today is 122. Patient has been sleeping mostly for the past week, which got worse since yesterday. Since his sodium was low that would cause his sleepiness his PCP recommended coming to the hospital. His intake has been decreased since yesterday. He does not drink free water much normally. Does drink a lot of Dr. Malachi Bonds every day.  PAST MEDICAL HISTORY:   Past Medical History  Diagnosis Date  . COPD with asthma   . Diabetes mellitus     Diet control   . Hyperlipidemia   . Carpal tunnel syndrome   . DJD (degenerative joint disease), cervical   . Vitamin D deficiency   . Tremor, essential   . Internal hemorrhoids   . Personal history of colonic polyps     adenomatous  . IBS (irritable bowel syndrome)   . Peptic ulcer   . Duodenal ulcer, with partial obstruction 08/10/2012  . Numbness and tingling in right hand     started 2 yeas ago  . Hypertension     no medicine needed  . Memory deficit 12/16/2013  . Essential and other specified forms of tremor 12/16/2013  . Polyneuropathy in diabetes(357.2)     PAST  SURGICAL HISTORY:   Past Surgical History  Procedure Laterality Date  . Anal fissure repair    . Colonoscopy w/ biopsies and polypectomy  8/03, 6/05, 7/09, 9/10    internal hemorrhoids, tubular adenomas, mucosa & lymphoid nodules  . Upper gastrointestinal endoscopy  3/05, 7/09, 9/10,2013    gastritis, duodenitis  . Total hip arthroplasty Right 11/13/2012    Procedure: TOTAL HIP ARTHROPLASTY ANTERIOR APPROACH;  Surgeon: Mcarthur Rossetti, MD;  Location: [facility];  Service: Orthopedics;  Laterality: Right;  Right total hip arthroplasty  . Ankle surgery Right     right- pins placed in    SOCIAL HISTORY:   Social History  Substance Use Topics  . Smoking status: Former Smoker    Types: Cigarettes    Quit date: 07/02/1963  . Smokeless tobacco: Never Used  . Alcohol Use: 0.0 oz/week    0 Standard drinks or equivalent per week     Comment: occasional    FAMILY HISTORY:   Family History  Problem Relation Age of Onset  . Colon cancer Neg Hx   . Esophageal cancer Neg Hx   . Rectal cancer Neg Hx   . Stomach cancer Neg Hx   . Leukemia Maternal Aunt   . Thyroid cancer Daughter 60  . Diabetes Son     DRUG ALLERGIES:   Allergies  Allergen Reactions  . Fexofenadine Other (See Comments)  . Quinapril Hcl Other (See Comments)    Unknown   . Latex Rash    REVIEW OF SYSTEMS:   Review of Systems  Unable to perform ROS: critical illness  very lethargic on exam  MEDICATIONS AT HOME:   Prior to Admission medications   Medication Sig Start Date End Date Taking? Authorizing Provider  acetaminophen (TYLENOL) 500 MG tablet Take 500 mg by mouth every 6 (six) hours as needed.   Yes Historical Provider, MD  Docusate Calcium (STOOL SOFTENER PO) Take by mouth.   Yes Historical Provider, MD  ipratropium (ATROVENT) 0.06 % nasal spray Place 2 sprays into the nose daily as needed.    Yes Historical Provider, MD  levothyroxine (SYNTHROID, LEVOTHROID) 50 MCG tablet Take 50 mcg by mouth daily  before breakfast.   Yes Reynold Bowen, MD  loratadine (CLARITIN) 10 MG tablet Take 10 mg by mouth daily.   Yes Historical Provider, MD  pantoprazole (PROTONIX) 40 MG tablet TAKE 1 TABLET BY MOUTH ONCE DAILY, 30-60MINUTES BEFORE A MEAL. 06/02/15  Yes Gatha Mayer, MD  saccharomyces boulardii (FLORASTOR) 250 MG capsule Take 250 mg by mouth daily.   Yes Historical Provider, MD  traMADol (ULTRAM) 50 MG tablet Take by mouth at bedtime as needed.   Yes Historical Provider, MD      VITAL SIGNS:  Blood pressure 162/75, pulse 83, temperature 98.5 F (36.9 C), temperature source Oral, resp. rate 20, height '5\' 3"'$  (1.6 m), weight 63.504 kg (140 lb), SpO2 99 %.  PHYSICAL EXAMINATION:   Physical Exam  GENERAL:  81 y.o.-year-old patient lying in the bed with no acute distress. Very lethargic EYES: Pupils equal, round, reactive to light and accommodation. No scleral icterus. Extraocular muscles intact.  HEENT: Head atraumatic, normocephalic. Oropharynx and nasopharynx clear. Poor oral hygeine NECK:  Supple, no jugular venous distention. No thyroid enlargement, no tenderness.  LUNGS: Normal breath sounds bilaterally, no wheezing, rales,rhonchi or crepitation. No use of accessory muscles of respiration. Decreased basilar breath sounds CARDIOVASCULAR: S1, S2 normal. No  rubs, or gallops. He/6 systolic murmur is present ABDOMEN: Soft, nontender, nondistended. Bowel sounds present. No organomegaly or mass.  EXTREMITIES: No pedal edema, cyanosis, or clubbing.  NEUROLOGIC: He isn't unable to participate in full neurological exam. At baseline he is alert and oriented according to wife. Currently very sleepy, arousable. Following some simple commands. No focal weakness noted.  PSYCHIATRIC: The patient is arousable and oriented to self  SKIN: No obvious rash, lesion, or ulcer.   LABORATORY PANEL:   CBC  Recent Labs Lab 06/25/15 1426  WBC 10.2  HGB 13.7  HCT 42.1  PLT 284    ------------------------------------------------------------------------------------------------------------------  Chemistries   Recent Labs Lab 06/24/15 0944  06/25/15 1426  NA 123*  < > 118*  K 4.3  --  4.2  CL 79*  --  77*  CO2 38*  --  31  GLUCOSE 118*  --  237*  BUN 11  --  12  CREATININE 0.78  --  0.85  CALCIUM 9.3  --  8.3*  AST 23  --   --   ALT 12  --   --   ALKPHOS 75  --   --   BILITOT 0.6  --   --   < > = values in this interval not displayed. ------------------------------------------------------------------------------------------------------------------  Cardiac Enzymes  Recent Labs Lab 06/25/15 1426  TROPONINI <0.03   ------------------------------------------------------------------------------------------------------------------  RADIOLOGY:  Dg Chest  2 View  06/25/2015   CLINICAL DATA:  Hyponatremia.  Weakness.  EXAM: CHEST  2 VIEW  COMPARISON:  06/01/2014 and 04/25/2012  FINDINGS: There is a vague nodular appearing density in the right upper lobe between the anterior aspects of the right second and third ribs. There is adjacent oxygen 2 being. This could that be artifactual. Lungs are otherwise clear. Heart size and vascularity are normal. Interposed bowel under the right hemidiaphragm, unchanged. No acute osseous abnormality.  IMPRESSION: Vague density in the right upper lobe. This may be artifactual. I recommend repeat AP or PA chest x-ray prior to discharge.   Electronically Signed   By: Lorriane Shire M.D.   On: 06/25/2015 16:27   Ct Head Wo Contrast  06/25/2015   CLINICAL DATA:  Altered mental status.  Weakness.  Hyponatremia.  EXAM: CT HEAD WITHOUT CONTRAST  TECHNIQUE: Contiguous axial images were obtained from the base of the skull through the vertex without intravenous contrast.  COMPARISON:  12/02/2008 head CT.  12/21/2013 brain MRI.  FINDINGS: No evidence of parenchymal hemorrhage or extra-axial fluid collection. No mass lesion, mass effect, or  midline shift. No CT evidence of acute infarction. Stable generalized cerebral volume loss. Cerebral ventricle size is are stable and concordant with the degree of cerebral volume loss. Atherosclerotic intracranial arteries. Nonspecific subcortical and periventricular white matter hypodensity, most in keeping with chronic small vessel ischemic change.  The visualized paranasal sinuses are essentially clear. The mastoid air cells are unopacified. No evidence of calvarial fracture.  IMPRESSION: 1. No acute intracranial abnormality. 2. Generalized cerebral volume loss and chronic small vessel ischemic change.   Electronically Signed   By: Ilona Sorrel M.D.   On: 06/25/2015 17:52   Ct Chest W Contrast  06/25/2015   CLINICAL DATA:  Abnormal chest x-ray  EXAM: CT CHEST WITH CONTRAST  TECHNIQUE: Multidetector CT imaging of the chest was performed during intravenous contrast administration.  CONTRAST:  39m OMNIPAQUE IOHEXOL 300 MG/ML  SOLN  COMPARISON:  Plain film from earlier in the same day  FINDINGS: The left lung is well aerated without focal infiltrate or sizable effusion. The right lung is also well aerated but demonstrates patchy changes in the posterior aspect of the right upper lobe. Some of these the appearance of early infiltrate although a more organized 15 mm mildly spiculated lesion is noted in the upper lobe best seen on image number 19 of series 3. No other focal lesions are noted.  The thoracic inlet is within normal limits. Calcifications are noted in the thoracic aorta and its branches although no aneurysmal dilatation or dissection is seen. Heavy coronary calcifications are noted particularly in the left anterior descending coronary artery. The pulmonary artery demonstrates a normal branching pattern without definitive filling defect. No significant lymphadenopathy is seen.  Scanning into the upper abdomen reveals no acute abnormality. A left renal cyst is noted. The osseous structures are within  normal limits.  IMPRESSION: Somewhat spiculated mass lesion in the right upper lobe with some associated right upper lobe infiltrative changes. This must be viewed with a degree of suspicion for underlying neoplasm. Continued workup is recommended. This lesion would be amenable to percutaneous biopsy if clinically indicated.   Electronically Signed   By: MInez CatalinaM.D.   On: 06/25/2015 17:51    EKG:   Orders placed or performed during the hospital encounter of 06/25/15  . EKG 12-Lead  . EKG 12-Lead  . ED EKG  . ED EKG  IMPRESSION AND PLAN:   Radek Carnero  is a 81 y.o. male with a known history of COPD, irritable bowel syndrome, hypertension, mild dementia presents from home secondary to altered mental status.  #1 altered mental status-likely secondary to hyponatremia. -Check a CT head, also check urine analysis. -ABG as he is very lethargic at this time. -For his hyponatremia, likely free water deficit. -IV normal saline started. Sodium checked every 6 hours. -Nephrology consult. -Urine and serum sodium, urine and serum osmolarity ordered.  #2 acute bronchitis-he has been having cough and sore throat for the past 2-3 days according to wife. -Chest x-ray with no acute infiltrate, may be a nodule noted. -CT of the chest was ordered in the emergency room to rule out any lung mass that could cause SIADH. -We'll start azithromycin.  #3 hypothyroidism-continue Synthroid  #4 Gastro esophageal reflux disease-continue Protonix  #5 DVT prophylaxis-Lovenox    All the records are reviewed and case discussed with ED provider. Management plans discussed with the patient, family and they are in agreement.  CODE STATUS: FULL CODE  TOTAL TIME TAKING CARE OF THIS PATIENT: 60 minutes.    Gladstone Lighter M.D on 06/25/2015 at 6:09 PM  Between 7am to 6pm - Pager - [phone]  After 6pm go to [email or web address] - password [email or web address] [facility]  Office   [phone]  CC: Primary care physician; [name], MD

## 2015-06-25 NOTE — ED Provider Notes (Signed)
[facility] Emergency Department Provider Note  Time seen: 3:43 PM  I have reviewed the triage vital signs and the nursing notes.   HISTORY  Chief Complaint Abnormal Lab    HPI Aaron Hendrix is a 81 y.o. male with a past medical history of COPD, diabetes, hyperlipidemia, hypertension, memory impairment, presents the emergency department with a low sodium. Patient states for the past one week he has been feeling quite weak he is also having cough and congestion. He went to see his primary care doctor yesterday who did blood work. Blood work showed a low sodium so the patient return to the primary care office today where she repeated blood work which once again returned with a low sodium and the patient was sent to the emergency department for further evaluation. Here the patient is somnolent, complains of generalized fatigue/weakness, and a occasional cough with minimal sputum production. Denies fever. Denies any nausea, vomiting, diarrhea. Denies smoking history for the past 50+ years. Patient does not wear oxygen at home, but presents with a low oxygen saturation.No chest pain. Has noted some mild lower extremity swelling but is not sure if this is new or old.    Past Medical History  Diagnosis Date  . COPD with asthma   . Diabetes mellitus     Diet control   . Hyperlipidemia   . Carpal tunnel syndrome   . DJD (degenerative joint disease), cervical   . Vitamin D deficiency   . Tremor, essential   . Internal hemorrhoids   . Personal history of colonic polyps     adenomatous  . IBS (irritable bowel syndrome)   . Peptic ulcer   . Duodenal ulcer, with partial obstruction 08/10/2012  . Numbness and tingling in right hand     started 2 yeas ago  . Hypertension     no medicine needed  . Memory deficit 12/16/2013  . Essential and other specified forms of tremor 12/16/2013  . Polyneuropathy in diabetes(357.2)     Patient Active Problem List   Diagnosis  Date Noted  . Skin lesion 02/16/2015  . Health care maintenance 02/16/2015  . Hand laceration 09/27/2014  . Hypothyroidism 09/27/2014  . Trigger thumb 06/22/2014  . Abdominal pain 06/22/2014  . Memory deficit 12/16/2013  . Essential and other specified forms of tremor 12/16/2013  . Diarrhea 10/24/2013  . Loss of weight 09/10/2013  . Periumbilical abdominal pain 09/10/2013  . Pain in lower limb 07/01/2013  . Dermatophytosis of foot 07/01/2013  . Degenerative arthritis of hip 11/13/2012  . Nausea 08/10/2012  . Hypertension 05/25/2011  . Diabetes mellitus, type II 05/25/2011  . Irritable bowel syndrome 03/31/2008  . History of colonic polyps 03/31/2008    Past Surgical History  Procedure Laterality Date  . Anal fissure repair    . Colonoscopy w/ biopsies and polypectomy  8/03, 6/05, 7/09, 9/10    internal hemorrhoids, tubular adenomas, mucosa & lymphoid nodules  . Upper gastrointestinal endoscopy  3/05, 7/09, 9/10,2013    gastritis, duodenitis  . Total hip arthroplasty Right 11/13/2012    Procedure: TOTAL HIP ARTHROPLASTY ANTERIOR APPROACH;  Surgeon: Mcarthur Rossetti, MD;  Location: [facility];  Service: Orthopedics;  Laterality: Right;  Right total hip arthroplasty  . Ankle surgery Right     right- pins placed in    Current Outpatient Rx  Name  Route  Sig  Dispense  Refill  . acetaminophen (TYLENOL) 500 MG tablet   Oral   Take 500 mg  by mouth every 6 (six) hours as needed.         Mariane Baumgarten Calcium (STOOL SOFTENER PO)   Oral   Take by mouth.         Marland Kitchen ipratropium (ATROVENT) 0.06 % nasal spray   Nasal   Place 2 sprays into the nose daily as needed.          Marland Kitchen levothyroxine (SYNTHROID, LEVOTHROID) 50 MCG tablet   Oral   Take 50 mcg by mouth daily before breakfast.         . loratadine (CLARITIN) 10 MG tablet   Oral   Take 10 mg by mouth daily.         . pantoprazole (PROTONIX) 40 MG tablet      TAKE 1 TABLET BY MOUTH ONCE DAILY, 30-60MINUTES BEFORE A  MEAL.   30 tablet   5   . saccharomyces boulardii (FLORASTOR) 250 MG capsule   Oral   Take 250 mg by mouth daily.         . traMADol (ULTRAM) 50 MG tablet   Oral   Take by mouth at bedtime as needed.           Allergies Fexofenadine; Quinapril hcl; and Latex  Family History  Problem Relation Age of Onset  . Colon cancer Neg Hx   . Esophageal cancer Neg Hx   . Rectal cancer Neg Hx   . Stomach cancer Neg Hx   . Leukemia Maternal Aunt   . Thyroid cancer Daughter 87  . Diabetes Son     Social History Social History  Substance Use Topics  . Smoking status: Former Smoker    Types: Cigarettes    Quit date: 07/02/1963  . Smokeless tobacco: Never Used  . Alcohol Use: 0.0 oz/week    0 Standard drinks or equivalent per week     Comment: occasional    Review of Systems Constitutional: Negative for fever. Cardiovascular: Negative for chest pain. Respiratory: Negative for shortness of breath. Positive for cough. Gastrointestinal: Negative for abdominal pain, vomiting and diarrhea. Neurological: Negative for headache 10-point ROS otherwise negative.  ____________________________________________   PHYSICAL EXAM:  VITAL SIGNS: ED Triage Vitals  Enc Vitals Group     BP 06/25/15 1427 153/70 mmHg     Pulse Rate 06/25/15 1427 91     Resp 06/25/15 1427 18     Temp 06/25/15 1427 98.5 F (36.9 C)     Temp Source 06/25/15 1427 Oral     SpO2 06/25/15 1427 92 %     Weight 06/25/15 1427 140 lb (63.504 kg)     Height 06/25/15 1427 '5\' 3"'$  (1.6 m)     Head Cir --      Peak Flow --      Pain Score 06/25/15 1533 0     Pain Loc --      Pain Edu? --      Excl. in [place]? --     Constitutional: Alert but somnolent. No distress. Eyes: Normal exam ENT   Head: Normocephalic and atraumatc. Cardiovascular: Normal rate, regular rhythm.  Respiratory: Normal respiratory effort without tachypnea nor retractions. Mild rhonchi bilaterally. No wheeze or rales. Gastrointestinal: Soft  and nontender. No distention.  Musculoskeletal: Nontender with normal range of motion in all extremities. 1-2 plus lower extremity edema, nontender to palpation, equal bilaterally. Neurologic:  Normal speech and language. No gross focal neurologic deficits are appreciated. Speech is normal. Skin:  Skin is warm, dry and intact.  Psychiatric:  Mood and affect are normal. Speech and behavior are normal.  ____________________________________________    EKG  EKG reviewed and interpreted by myself shows a normal sinus rhythm at 90 bpm, slightly widened QRS, otherwise normal intervals, morphology consistent with a bifascicular block, nonspecific ST changes present. No ST elevations noted.  ____________________________________________    RADIOLOGY  Density in right upper lobe.  ____________________________________________    INITIAL IMPRESSION / ASSESSMENT AND PLAN / ED COURSE  Pertinent labs & imaging results that were available during my care of the patient were reviewed by me and considered in my medical decision making (see chart for details).  Patient with 1 week of generalized weakness, sent to the emergency department with a sodium of 118. Patient has noted a cough for the past one week, denies fever, nausea, vomiting, diarrhea. Remote smoking history. We will repeat labs, check a chest x-ray as the patient is mildly hypoxic 89-90% on room air, with a cough and a low sodium. We will start normal saline IV infusion of 150 ML/hour. Patient is alert, follows all commands, somnolent but answers questions pulse commands appropriately.  Chest x-ray shows a density in the right upper lobe, we will cover for pneumonia and given cough, hypoxia. Patient will need a CT scan at some point to rule out malignancy given his hyponatremia. We will admit the patient further workup and evaluation.  ____________________________________________   FINAL CLINICAL IMPRESSION(S) / ED  DIAGNOSES  Hyponatremia  generalized weakness Pneumonia  Harvest Dark, MD 06/25/15 [phone]

## 2015-06-25 NOTE — Progress Notes (Signed)
Order placed for f/u sodium.  ?

## 2015-06-26 ENCOUNTER — Inpatient Hospital Stay: Payer: Medicare Other

## 2015-06-26 ENCOUNTER — Inpatient Hospital Stay (HOSPITAL_COMMUNITY)
Admit: 2015-06-26 | Discharge: 2015-06-26 | Disposition: A | Discharge status: Home / Self Care | Payer: Medicare Other | Attending: Internal Medicine | Admitting: Internal Medicine

## 2015-06-26 DIAGNOSIS — A419 Sepsis, unspecified organism: Secondary | ICD-10-CM

## 2015-06-26 DIAGNOSIS — J45909 Unspecified asthma, uncomplicated: Secondary | ICD-10-CM

## 2015-06-26 DIAGNOSIS — Z9911 Dependence on respirator [ventilator] status: Secondary | ICD-10-CM

## 2015-06-26 DIAGNOSIS — R6520 Severe sepsis without septic shock: Secondary | ICD-10-CM

## 2015-06-26 DIAGNOSIS — J9620 Acute and chronic respiratory failure, unspecified whether with hypoxia or hypercapnia: Secondary | ICD-10-CM

## 2015-06-26 DIAGNOSIS — R911 Solitary pulmonary nodule: Secondary | ICD-10-CM

## 2015-06-26 DIAGNOSIS — R400 Somnolence: Secondary | ICD-10-CM

## 2015-06-26 DIAGNOSIS — R918 Other nonspecific abnormal finding of lung field: Secondary | ICD-10-CM

## 2015-06-26 DIAGNOSIS — J440 Chronic obstructive pulmonary disease with acute lower respiratory infection: Secondary | ICD-10-CM

## 2015-06-26 DIAGNOSIS — R0602 Shortness of breath: Secondary | ICD-10-CM

## 2015-06-26 DIAGNOSIS — E872 Acidosis: Secondary | ICD-10-CM

## 2015-06-26 DIAGNOSIS — E871 Hypo-osmolality and hyponatremia: Secondary | ICD-10-CM

## 2015-06-26 DIAGNOSIS — Z515 Encounter for palliative care: Secondary | ICD-10-CM

## 2015-06-26 DIAGNOSIS — D72829 Elevated white blood cell count, unspecified: Secondary | ICD-10-CM

## 2015-06-26 DIAGNOSIS — J9601 Acute respiratory failure with hypoxia: Secondary | ICD-10-CM

## 2015-06-26 DIAGNOSIS — J209 Acute bronchitis, unspecified: Secondary | ICD-10-CM

## 2015-06-26 DIAGNOSIS — R4182 Altered mental status, unspecified: Secondary | ICD-10-CM

## 2015-06-26 DIAGNOSIS — J189 Pneumonia, unspecified organism: Principal | ICD-10-CM

## 2015-06-26 DIAGNOSIS — R509 Fever, unspecified: Secondary | ICD-10-CM

## 2015-06-26 DIAGNOSIS — G92 Toxic encephalopathy: Secondary | ICD-10-CM

## 2015-06-26 DIAGNOSIS — F0390 Unspecified dementia without behavioral disturbance: Secondary | ICD-10-CM

## 2015-06-26 DIAGNOSIS — J9602 Acute respiratory failure with hypercapnia: Secondary | ICD-10-CM

## 2015-06-26 LAB — BLOOD GAS, ARTERIAL
ACID-BASE EXCESS: 7.4 mmol/L — AB (ref 0.0–3.0)
ALLENS TEST (PASS/FAIL): POSITIVE — AB
ALLENS TEST (PASS/FAIL): POSITIVE — AB
Bicarbonate: 28.5 mEq/L — ABNORMAL HIGH (ref 21.0–28.0)
FIO2: 0.3
FIO2: 0.4
Mechanical Rate: 24
O2 Saturation: 98.6 %
PEEP/CPAP: 5 cmH2O
Patient temperature: 37
Patient temperature: 37
RATE: 24 resp/min
VT: 500 mL
pCO2 arterial: 162 mmHg (ref 32.0–48.0)
pCO2 arterial: 29 mmHg — ABNORMAL LOW (ref 32.0–48.0)
pH, Arterial: 6.99 — CL (ref 7.350–7.450)
pH, Arterial: 7.6 — ABNORMAL HIGH (ref 7.350–7.450)
pO2, Arterial: 245 mmHg — ABNORMAL HIGH (ref 83.0–108.0)
pO2, Arterial: 99 mmHg (ref 83.0–108.0)

## 2015-06-26 LAB — CBC
HCT: 41.6 % (ref 40.0–52.0)
Hemoglobin: 13.5 g/dL (ref 13.0–18.0)
MCH: 30.5 pg (ref 26.0–34.0)
MCHC: 32.3 g/dL (ref 32.0–36.0)
MCV: 94.3 fL (ref 80.0–100.0)
PLATELETS: 271 10*3/uL (ref 150–440)
RBC: 4.42 MIL/uL (ref 4.40–5.90)
RDW: 12.9 % (ref 11.5–14.5)
WBC: 12.1 10*3/uL — AB (ref 3.8–10.6)

## 2015-06-26 LAB — BASIC METABOLIC PANEL
Anion gap: 8 (ref 5–15)
BUN: 11 mg/dL (ref 6–20)
CALCIUM: 8.3 mg/dL — AB (ref 8.9–10.3)
CO2: 35 mmol/L — ABNORMAL HIGH (ref 22–32)
Chloride: 81 mmol/L — ABNORMAL LOW (ref 101–111)
Creatinine, Ser: 0.73 mg/dL (ref 0.61–1.24)
GFR calc Af Amer: >60 mL/min (ref >60)
GLUCOSE: 133 mg/dL — AB (ref 65–99)
Potassium: 4.3 mmol/L (ref 3.5–5.1)
SODIUM: 124 mmol/L — AB (ref 135–145)

## 2015-06-26 LAB — TROPONIN I: Troponin I: 0.06 ng/mL — ABNORMAL HIGH (ref <0.031)

## 2015-06-26 LAB — SODIUM: SODIUM: 122 mmol/L — AB (ref 135–145)

## 2015-06-26 LAB — GLUCOSE, CAPILLARY
GLUCOSE-CAPILLARY: 244 mg/dL — AB (ref 65–99)
Glucose-Capillary: 172 mg/dL — ABNORMAL HIGH (ref 65–99)

## 2015-06-26 LAB — MRSA PCR SCREENING: MRSA BY PCR: NEGATIVE

## 2015-06-26 MED ORDER — VANCOMYCIN HCL IN DEXTROSE 750-5 MG/150ML-% IV SOLN
750.0000 mg | INTRAVENOUS | Status: AC
  Administered 2015-06-26: 750 mg via INTRAVENOUS
  Filled 2015-06-26: qty 150

## 2015-06-26 MED ORDER — VANCOMYCIN HCL IN DEXTROSE 750-5 MG/150ML-% IV SOLN
750.0000 mg | Freq: Two times a day (BID) | INTRAVENOUS | Status: DC
  Administered 2015-06-26 – 2015-06-27 (×2): 750 mg via INTRAVENOUS
  Filled 2015-06-26 (×4): qty 150

## 2015-06-26 MED ORDER — PIPERACILLIN-TAZOBACTAM 3.375 G IVPB
3.3750 g | Freq: Three times a day (TID) | INTRAVENOUS | Status: DC
  Administered 2015-06-26 – 2015-06-27 (×3): 3.375 g via INTRAVENOUS
  Filled 2015-06-26 (×8): qty 50

## 2015-06-26 MED ORDER — FREE WATER
200.0000 mL | Freq: Three times a day (TID) | Status: DC
  Administered 2015-06-26: 200 mL

## 2015-06-26 MED ORDER — NOREPINEPHRINE BITARTRATE 1 MG/ML IV SOLN: 0.0000 ug/min | INTRAVENOUS | Status: DC

## 2015-06-26 MED ORDER — FUROSEMIDE 10 MG/ML IJ SOLN
40.0000 mg | Freq: Once | INTRAMUSCULAR | Status: AC
  Administered 2015-06-26: 40 mg via INTRAVENOUS
  Filled 2015-06-26: qty 4

## 2015-06-26 MED ORDER — METHYLPREDNISOLONE SODIUM SUCC 125 MG IJ SOLR
125.0000 mg | INTRAMUSCULAR | Status: AC
  Administered 2015-06-26: 125 mg via INTRAVENOUS

## 2015-06-26 MED ORDER — FENTANYL CITRATE (PF) 100 MCG/2ML IJ SOLN
100.0000 ug | Freq: Once | INTRAMUSCULAR | Status: AC
  Administered 2015-06-26: 100 ug via INTRAVENOUS
  Filled 2015-06-26: qty 2

## 2015-06-26 MED ORDER — FENTANYL BOLUS VIA INFUSION
25.0000 ug | INTRAVENOUS | Status: DC | PRN
  Administered 2015-06-26 (×4): 25 ug via INTRAVENOUS
  Filled 2015-06-26: qty 25

## 2015-06-26 MED ORDER — PIPERACILLIN-TAZOBACTAM 3.375 G IVPB 30 MIN: 3.3750 g | Freq: Three times a day (TID) | INTRAVENOUS | Status: DC

## 2015-06-26 MED ORDER — MIDAZOLAM HCL 2 MG/2ML IJ SOLN
2.0000 mg | Freq: Once | INTRAMUSCULAR | Status: AC
  Administered 2015-06-26: 2 mg via INTRAVENOUS
  Filled 2015-06-26: qty 2

## 2015-06-26 MED ORDER — INSULIN ASPART 100 UNIT/ML ~~LOC~~ SOLN: 0.0000 [IU] | Freq: Three times a day (TID) | SUBCUTANEOUS | Status: DC

## 2015-06-26 MED ORDER — ANTISEPTIC ORAL RINSE SOLUTION (CORINZ)
7.0000 mL | Freq: Four times a day (QID) | OROMUCOSAL | Status: DC
  Administered 2015-06-26 – 2015-07-01 (×21): 7 mL via OROMUCOSAL
  Filled 2015-06-26 (×28): qty 7

## 2015-06-26 MED ORDER — NOREPINEPHRINE 4 MG/250ML-% IV SOLN
0.0000 ug/min | INTRAVENOUS | Status: DC
  Administered 2015-06-26: 3 ug/min via INTRAVENOUS

## 2015-06-26 MED ORDER — CHLORHEXIDINE GLUCONATE 0.12% ORAL RINSE (MEDLINE KIT)
15.0000 mL | Freq: Two times a day (BID) | OROMUCOSAL | Status: DC
  Administered 2015-06-26 – 2015-07-01 (×11): 15 mL via OROMUCOSAL
  Filled 2015-06-26 (×15): qty 15

## 2015-06-26 MED ORDER — INSULIN ASPART 100 UNIT/ML ~~LOC~~ SOLN
0.0000 [IU] | Freq: Four times a day (QID) | SUBCUTANEOUS | Status: DC
  Administered 2015-06-26: 3 [IU] via SUBCUTANEOUS
  Administered 2015-06-26: 2 [IU] via SUBCUTANEOUS
  Administered 2015-06-27: 5 [IU] via SUBCUTANEOUS
  Administered 2015-06-27: 2 [IU] via SUBCUTANEOUS
  Administered 2015-06-27 – 2015-06-28 (×2): 5 [IU] via SUBCUTANEOUS
  Administered 2015-06-28: 3 [IU] via SUBCUTANEOUS
  Administered 2015-06-28: 7 [IU] via SUBCUTANEOUS
  Administered 2015-06-28 – 2015-06-29 (×3): 3 [IU] via SUBCUTANEOUS
  Administered 2015-06-29: 5 [IU] via SUBCUTANEOUS
  Administered 2015-06-29 – 2015-06-30 (×2): 2 [IU] via SUBCUTANEOUS
  Administered 2015-06-30: 3 [IU] via SUBCUTANEOUS
  Administered 2015-06-30: 5 [IU] via SUBCUTANEOUS
  Administered 2015-06-30 (×2): 3 [IU] via SUBCUTANEOUS
  Administered 2015-07-01: 2 [IU] via SUBCUTANEOUS
  Administered 2015-07-02: 1 [IU] via SUBCUTANEOUS
  Administered 2015-07-02: 2 [IU] via SUBCUTANEOUS
  Administered 2015-07-02 (×3): 1 [IU] via SUBCUTANEOUS
  Administered 2015-07-03: 3 [IU] via SUBCUTANEOUS
  Administered 2015-07-03: 2 [IU] via SUBCUTANEOUS
  Administered 2015-07-04 – 2015-07-05 (×2): 5 [IU] via SUBCUTANEOUS
  Administered 2015-07-05 (×2): 1 [IU] via SUBCUTANEOUS
  Administered 2015-07-06: 2 [IU] via SUBCUTANEOUS
  Filled 2015-06-26: qty 1
  Filled 2015-06-26: qty 5
  Filled 2015-06-26: qty 1
  Filled 2015-06-26 (×2): qty 5
  Filled 2015-06-26: qty 2
  Filled 2015-06-26: qty 3
  Filled 2015-06-26: qty 1
  Filled 2015-06-26: qty 3
  Filled 2015-06-26: qty 5
  Filled 2015-06-26: qty 3
  Filled 2015-06-26 (×2): qty 2
  Filled 2015-06-26: qty 5
  Filled 2015-06-26: qty 1
  Filled 2015-06-26: qty 3
  Filled 2015-06-26: qty 7
  Filled 2015-06-26 (×2): qty 3
  Filled 2015-06-26: qty 2
  Filled 2015-06-26: qty 3
  Filled 2015-06-26: qty 1
  Filled 2015-06-26 (×2): qty 5
  Filled 2015-06-26: qty 2
  Filled 2015-06-26: qty 1
  Filled 2015-06-26: qty 3
  Filled 2015-06-26 (×2): qty 2
  Filled 2015-06-26: qty 3
  Filled 2015-06-26: qty 2

## 2015-06-26 MED ORDER — IPRATROPIUM-ALBUTEROL 0.5-2.5 (3) MG/3ML IN SOLN
3.0000 mL | Freq: Four times a day (QID) | RESPIRATORY_TRACT | Status: DC
  Administered 2015-06-26 – 2015-07-03 (×26): 3 mL via RESPIRATORY_TRACT
  Filled 2015-06-26 (×30): qty 3

## 2015-06-26 MED ORDER — NOREPINEPHRINE 4 MG/250ML-% IV SOLN
INTRAVENOUS | Status: AC
  Filled 2015-06-26: qty 250

## 2015-06-26 MED ORDER — IPRATROPIUM-ALBUTEROL 0.5-2.5 (3) MG/3ML IN SOLN: 3.0000 mL | Freq: Four times a day (QID) | RESPIRATORY_TRACT | Status: DC

## 2015-06-26 MED ORDER — SODIUM CHLORIDE 0.9 % IV SOLN
INTRAVENOUS | Status: DC
  Administered 2015-06-26: 75 mL/h via INTRAVENOUS
  Administered 2015-06-28: 22:00:00 via INTRAVENOUS

## 2015-06-26 MED ORDER — METHYLPREDNISOLONE SODIUM SUCC 125 MG IJ SOLR
60.0000 mg | Freq: Four times a day (QID) | INTRAMUSCULAR | Status: DC
  Administered 2015-06-26 – 2015-06-27 (×4): 60 mg via INTRAVENOUS
  Filled 2015-06-26 (×4): qty 2

## 2015-06-26 MED ORDER — FENTANYL 2500MCG IN NS 250ML (10MCG/ML) PREMIX INFUSION
25.0000 ug/h | INTRAVENOUS | Status: DC
  Administered 2015-06-26: 25 ug/h via INTRAVENOUS
  Administered 2015-06-26: 50 ug/h via INTRAVENOUS
  Administered 2015-06-28: 125 ug/h via INTRAVENOUS
  Filled 2015-06-26 (×3): qty 250

## 2015-06-26 MED ORDER — VECURONIUM BROMIDE 10 MG IV SOLR
10.0000 mg | Freq: Once | INTRAVENOUS | Status: AC
  Administered 2015-06-26: 10 mg via INTRAVENOUS
  Filled 2015-06-26: qty 10

## 2015-06-26 MED ORDER — MIDAZOLAM HCL 2 MG/2ML IJ SOLN
1.0000 mg | INTRAMUSCULAR | Status: DC | PRN
  Administered 2015-06-26 – 2015-06-28 (×7): 1 mg via INTRAVENOUS

## 2015-06-26 MED ORDER — FENTANYL CITRATE (PF) 100 MCG/2ML IJ SOLN: 50.0000 ug | Freq: Once | INTRAMUSCULAR | Status: AC

## 2015-06-26 MED ORDER — MIDAZOLAM HCL 2 MG/2ML IJ SOLN
1.0000 mg | INTRAMUSCULAR | Status: DC | PRN
  Administered 2015-06-26 (×2): 1 mg via INTRAVENOUS
  Filled 2015-06-26 (×9): qty 2

## 2015-06-26 MED ORDER — VITAL HIGH PROTEIN PO LIQD
1000.0000 mL | ORAL | Status: DC
  Administered 2015-06-26: 1000 mL
  Administered 2015-06-27: 11:00:00
  Administered 2015-06-27 – 2015-07-01 (×7): 1000 mL

## 2015-06-26 NOTE — Progress Notes (Signed)
eLink Physician-Brief Progress Note Patient Name: Aaron Hendrix DOB: 02-25-1930 MRN: [id number]   Date of Service  06/26/2015  HPI/Events of Note  Low output In setting likely siadh Levo low dsoe about to be dc by RN  eICU Interventions  cvp Lasix x1     Intervention Category Intermediate Interventions: Oliguria - evaluation and management  Raylene Miyamoto. 06/26/2015, 6:39 PM

## 2015-06-26 NOTE — Progress Notes (Signed)
[place] [facility] [place] NAME: Aaron Hendrix    MR#:  [id number]  DATE OF BIRTH:  25-Oct-1929  SUBJECTIVE:  CHIEF COMPLAINT:  Patient is non-arousable to verbal commands. Stat ABGs have revealed pH 6.99 with PCO2 at 160 Patient is transferred to intensive care unit immediately  for intubation after discussing with the family members  REVIEW OF SYSTEMS:  Review of systems unobtainable DRUG ALLERGIES:   Allergies  Allergen Reactions  . Fexofenadine Other (See Comments)  . Quinapril Hcl Other (See Comments)    Unknown   . Latex Rash    VITALS:  Blood pressure 151/62, pulse 83, temperature 98.2 F (36.8 C), temperature source Oral, resp. rate 18, height '5\' 7"'$  (1.702 m), weight 66.044 kg (145 lb 9.6 oz), SpO2 100 %.  PHYSICAL EXAMINATION:  GENERAL:  81 y.o.-year-old patient lying in the bed with no acute distress.  EYES: Pupils equal, round, reactive to light and accommodation. No scleral icterus.  HEENT: Head atraumatic, normocephalic.  NECK:  Supple, no jugular venous distention. No thyroid enlargement, no tenderness.  LUNGS: Shallow respirations with a decreased breath sounds bilaterally, minimal wheezing, no rales,rhonchi or crepitation. No use of accessory muscles of respiration.  CARDIOVASCULAR: S1, S2 normal. No murmurs, rubs, or gallops.  ABDOMEN: Soft, nontender, nondistended. Bowel sounds present. No organomegaly or mass.  EXTREMITIES: No pedal edema, cyanosis, or clubbing.  NEUROLOGIC: Patient is with altered mental status PSYCHIATRIC: The patient is with altered mental status  SKIN: No obvious rash, lesion, or ulcer.    LABORATORY PANEL:   CBC  Recent Labs Lab 06/26/15 0245  WBC 12.1*  HGB 13.5  HCT 41.6  PLT 271   ------------------------------------------------------------------------------------------------------------------  Chemistries   Recent Labs Lab 06/24/15 0944  06/26/15 0245  NA 123*  < >  124*  K 4.3  < > 4.3  CL 79*  < > 81*  CO2 38*  < > 35*  GLUCOSE 118*  < > 133*  BUN 11  < > 11  CREATININE 0.78  < > 0.73  CALCIUM 9.3  < > 8.3*  AST 23  --   --   ALT 12  --   --   ALKPHOS 75  --   --   BILITOT 0.6  --   --   < > = values in this interval not displayed. ------------------------------------------------------------------------------------------------------------------  Cardiac Enzymes  Recent Labs Lab 06/25/15 1426  TROPONINI <0.03   ------------------------------------------------------------------------------------------------------------------  RADIOLOGY:  Dg Chest 2 View  06/25/2015   CLINICAL DATA:  Hyponatremia.  Weakness.  EXAM: CHEST  2 VIEW  COMPARISON:  06/01/2014 and 04/25/2012  FINDINGS: There is a vague nodular appearing density in the right upper lobe between the anterior aspects of the right second and third ribs. There is adjacent oxygen 2 being. This could that be artifactual. Lungs are otherwise clear. Heart size and vascularity are normal. Interposed bowel under the right hemidiaphragm, unchanged. No acute osseous abnormality.  IMPRESSION: Vague density in the right upper lobe. This may be artifactual. I recommend repeat AP or PA chest x-ray prior to discharge.   Electronically Signed   By: Lorriane Shire M.D.   On: 06/25/2015 16:27   Ct Head Wo Contrast  06/25/2015   CLINICAL DATA:  Altered mental status.  Weakness.  Hyponatremia.  EXAM: CT HEAD WITHOUT CONTRAST  TECHNIQUE: Contiguous axial images were obtained from the base of the skull through the vertex without intravenous contrast.  COMPARISON:  12/02/2008 head CT.  12/21/2013 brain MRI.  FINDINGS: No evidence of parenchymal hemorrhage or extra-axial fluid collection. No mass lesion, mass effect, or midline shift. No CT evidence of acute infarction. Stable generalized cerebral volume loss. Cerebral ventricle size is are stable and concordant with the degree of cerebral volume loss. Atherosclerotic  intracranial arteries. Nonspecific subcortical and periventricular white matter hypodensity, most in keeping with chronic small vessel ischemic change.  The visualized paranasal sinuses are essentially clear. The mastoid air cells are unopacified. No evidence of calvarial fracture.  IMPRESSION: 1. No acute intracranial abnormality. 2. Generalized cerebral volume loss and chronic small vessel ischemic change.   Electronically Signed   By: Ilona Sorrel M.D.   On: 06/25/2015 17:52   Ct Chest W Contrast  06/25/2015   CLINICAL DATA:  Abnormal chest x-ray  EXAM: CT CHEST WITH CONTRAST  TECHNIQUE: Multidetector CT imaging of the chest was performed during intravenous contrast administration.  CONTRAST:  49m OMNIPAQUE IOHEXOL 300 MG/ML  SOLN  COMPARISON:  Plain film from earlier in the same day  FINDINGS: The left lung is well aerated without focal infiltrate or sizable effusion. The right lung is also well aerated but demonstrates patchy changes in the posterior aspect of the right upper lobe. Some of these the appearance of early infiltrate although a more organized 15 mm mildly spiculated lesion is noted in the upper lobe best seen on image number 19 of series 3. No other focal lesions are noted.  The thoracic inlet is within normal limits. Calcifications are noted in the thoracic aorta and its branches although no aneurysmal dilatation or dissection is seen. Heavy coronary calcifications are noted particularly in the left anterior descending coronary artery. The pulmonary artery demonstrates a normal branching pattern without definitive filling defect. No significant lymphadenopathy is seen.  Scanning into the upper abdomen reveals no acute abnormality. A left renal cyst is noted. The osseous structures are within normal limits.  IMPRESSION: Somewhat spiculated mass lesion in the right upper lobe with some associated right upper lobe infiltrative changes. This must be viewed with a degree of suspicion for underlying  neoplasm. Continued workup is recommended. This lesion would be amenable to percutaneous biopsy if clinically indicated.   Electronically Signed   By: MInez CatalinaM.D.   On: 06/25/2015 17:51    EKG:   Orders placed or performed during the hospital encounter of 06/25/15  . EKG 12-Lead  . EKG 12-Lead  . ED EKG  . ED EKG    ASSESSMENT AND PLAN:   CGoldie Dimmeris a 81y.o. male with a known history of COPD, irritable bowel syndrome, hypertension, mild dementia presents from home secondary to altered mental status.  #1 altered mental status-likely secondary to acute respiratory failure , sepsis an hyponatremia. Transfer patient to intensive care unit for intubation secondary to acute respiratory acidosis - CT head normal, also check urine analysis. -Stat ABG has revealed acute respiratory acidosis with hypercapnia -Meets septic criteria with leukocytosis, altered mental status and chest x-ray with infiltrate  #2 acute respiratory failure with acute respiratory acidosis and hypercapnia secondary to pneumonia and COPD exacerbation Stat ABG has revealed pH 6.99 and PCO2 at 160 Family is agreeable for intubation, transfer patient to intensive care unit and discussed with intensivist for intubation ASAP Appreciate critical care recommendations  #3 right upper lobe spiculated lung mass with infiltrate Oncology is consulted Provide broad-spectrum antibiotics with Zosyn and levofloxacin  #4 sepsis secondary to right upper lobe pneumonia Patient is  started on broad-spectrum IV antibiotics Zosyn and levofloxacin Will get sputum culture and sensitivity Started on IV Solu-Medrol-stress dose steroids  #5 acute exacerbation of COPD Patient is started on IV Solu-Medrol 125 mg and will continue Solu-Medrol 60 mg IV every 6 hours Continue IV antibiotics and nebulizer treatments  #6 hyponatremia could be from SIADH secondary to problem #3 Provide IV fluids Monitor sodium closely Sodium is  slightly better from 118--> 124 Nephrology consult is pending  #History of hypothyroidism Check TSH Continue levothyroxine       -IV normal saline started. Sodium checked every 6 hours. -Nephrology consult. -Urine and serum sodium, urine and serum osmolarity ordered.  #2 acute bronchitis-he has been having cough and sore throat for the past 2-3 days according to wife. -Chest x-ray with no acute infiltrate, may be a nodule noted. -CT of the chest was ordered in the emergency room to rule out any lung mass that could cause SIADH. -We'll start azithromycin.  #3 hypothyroidism-continue Synthroid  #4 Gastro esophageal reflux disease-continue Protonix  #5 DVT prophylaxis-Lovenox  We will consult palliative care if family is agreeable    All the records are reviewed and case discussed with Care Management/Social Workerr. Management plans discussed with the patient, family and they are in agreement.  CODE STATUS: Full code  TOTAL critical care TIME TAKING CARE OF THIS PATIENT: 40 minutes. More than 50% time was spent in coordination of care, discussing with  Specialists, patient's family and educating them  POSSIBLE D/C IN ?  DAYS, DEPENDING ON CLINICAL CONDITION.   Nicholes Mango M.D on 06/26/2015 at 9:58 AM  Between 7am to 6pm - Pager - [phone] After 6pm go to [email or web address] - password [email or web address] [facility]  Office  [phone]  CC: Primary care physician; [name], MD

## 2015-06-26 NOTE — Procedures (Signed)
Procedure Note:  Orotracheal Intubation  Implied consent due to emergent nature of patient's condition. Correct Patient, Name & ID confirmed.  The patient was pre-oxygenated and then, under direct visualization, a 8.78m cuffed endotracheal tube was placed through the vocal cords into the trachea, using the Glidescope.  Total attempts made 1, using Glidescope, mild thin/watery secretions noted.  During intubation an assistant applied gentle pressure to the cricoid cartilage.  Position confirmed by auscultation of lungs (good breath sounds bilaterally) and no stomach sounds.  Tube secured at 24 cm. Pulse ox 100 %. CO2 detector in place with appropriate color change.   Pt tolerated procedure well.  No complications were noted.   CXR ordered.   VVilinda Boehringer MD [facility] Pager [phone] Call Pager [phone]

## 2015-06-26 NOTE — Progress Notes (Addendum)
Called Dr. Margaretmary Eddy reported patient is lethargiic, VSS, patient is non responsive No orders given to me. Dr Margaretmary Eddy to place order for ABG and on the way to see patient.

## 2015-06-26 NOTE — Consult Note (Addendum)
Palliative Medicine Inpatient Consult Note   Name: Aaron Hendrix Date: 06/26/2015 MRN: [id number]  DOB: 25-Jul-1930  Referring Physician: Nicholes Mango, MD  Palliative Care consult requested for this 81 y.o. male for goals of medical therapy in patient with acute on chronic respiratory failure and probably lung cancer.  He is intubated.  He has been seen by Nephrology for the hyponatremia and by Oncology.     IMPRESSION: Acute Hypercarbic Respiratory Failure ---pt unresponsive this am and ABG showed severe hypercarbia and respiratory acidosis (pCO2 162 and pH 6.99) ---pt now intubated and ventilated and sedated Pneumonia --likely present on admission based on CT report ---on Vanco and Zosyn Newly found lung mass 59m spiculated mass RUL COPD (with Asthma) ---With Acute Bronchitis Former Smoker ----Smoked for only about 10 yrs and quit about 50 yrs ago Irritable Bowel Syndrome Essential HTN Mild Dementia Hyponatremia (123 at adm and baseline is 137) ---lung mass is possibly the cause ?   Decreased oral intake Vit D deficiency PUD and h/o Duodenal Ulcer with h/o partial obstruction in 2013 Essential tremor (possibly other from of tremor also) Polyneuropathy due to DM2 DJD Dyslipidemia Hypothyroidism GERD Recent skin lesion removed Soda addiction (drinks a lot of Dr PSamson Fredericdaily)    PLAN: 1.  Family desires Full Code for now.  I spoke with pts wife and pts daughter, a pharmacist with [employer] for 30 yrs. Daughter has a good understanding of what is going on and her questions were answered by me about his medical condition.  Pts wife has some repetetiveness and tangential speech, so she might not be grasping all that is relayed to her. They are a delightful and caring family.   2. It seems apparent that the only logical explanation for this degree of hypercarbia came from his ASTHMA/COPD.  He had been on inhalers when he was a 'bigger man'.  He hasn't taken any in about 4 to 6  yrs. He used to be on ProAir and Advair and possibly others. He lost weight over the years as he aged --nothing drastic until last year when he had PUD and lost 15 lbs rapidly. Since then, he has gained 10 lbs of that back. It is thought that his current bout with bronchitis brought on an asthmatic component. Daughter (pharmacist) wants him to have inhalers or nebs--and would like this optimized and continued beyond intubated state as she thinks he likely needs to be back on these routinely again.  He didn't smoke for very long but was exposed to passive smoke in the [employer]   3. His wife is a 1 yr lung cancer stage 4 survivor and so family is inclined to be very aggressive with pt's medical approach at this time --since pt was in good condition prior to this time and since his wife has no current evident of lung cancer with the treatments she has had.  Dr CJones Broomis treating her.   435  Daughter had a number of questions about pts heart.  An echo is ordered at her request (and because pt suddenly started sleeping sitting up in the last two weeks per wife's report).  This was prompted by my discussion about the calcium in pts LAD and other arteries as seen on CT. I brought this up b/c I needed to let family know that he may recover, but he may have conditions that could cause a shortened lifespan despite a recovery from this event/ illness.    5.  Pt CANNOT drink DR  PEPPERs all day after this.  If its not the lesion in his lung causing the hyponatremia, it could be due to the many Dr Samson Frederic he drinks daily.  He reportedly has diet controlled diabetes since he lost weight gradually over the years. But he is hyperglycemic now and a HgbA1C might help guide approach.   6.  I will not see pt regularly but will follow at a distance.  I think he is appropriate for aggressive management --but have let family know that if things do not go well, I will be available to discuss his medical course and options for care.   I also let them know they should reconsider the full code status once he is off the ventilator since elderly patient's do not do well with death>resuscitation events.     REVIEW OF SYSTEMS:  Patient is not able to provide ROS due to critical illness  SPIRITUAL SUPPORT SYSTEM: Yes.  SOCIAL HISTORY:  reports that he quit smoking about 52 years ago. His smoking use included Cigarettes. He has never used smokeless tobacco. He reports that he drinks alcohol. He reports that he does not use illicit drugs.  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    LEGAL DOCUMENTS:  Unable to locate paper chart at this time  CODE STATUS: Full code  PAST MEDICAL HISTORY: Past Medical History  Diagnosis Date  . COPD with asthma   . Diabetes mellitus     Diet control   . Hyperlipidemia   . Carpal tunnel syndrome   . DJD (degenerative joint disease), cervical   . Vitamin D deficiency   . Tremor, essential   . Internal hemorrhoids   . Personal history of colonic polyps     adenomatous  . IBS (irritable bowel syndrome)   . Peptic ulcer   . Duodenal ulcer, with partial obstruction 08/10/2012  . Numbness and tingling in right hand     started 2 yeas ago  . Hypertension     no medicine needed  . Memory deficit 12/16/2013  . Essential and other specified forms of tremor 12/16/2013  . Polyneuropathy in diabetes(357.2)     PAST SURGICAL HISTORY:  Past Surgical History  Procedure Laterality Date  . Anal fissure repair    . Colonoscopy w/ biopsies and polypectomy  8/03, 6/05, 7/09, 9/10    internal hemorrhoids, tubular adenomas, mucosa & lymphoid nodules  . Upper gastrointestinal endoscopy  3/05, 7/09, 9/10,2013    gastritis, duodenitis  . Total hip arthroplasty Right 11/13/2012    Procedure: TOTAL HIP ARTHROPLASTY ANTERIOR APPROACH;  Surgeon: Mcarthur Rossetti, MD;  Location: [facility];  Service: Orthopedics;  Laterality: Right;  Right  total hip arthroplasty  . Ankle surgery Right     right- pins placed in    ALLERGIES:  is allergic to fexofenadine; quinapril hcl; and latex.  MEDICATIONS:  Current Facility-Administered Medications  Medication Dose Route Frequency Provider Last Rate Last Dose  . 0.9 %  sodium chloride infusion   Intravenous Continuous Vishal Mungal, MD 75 mL/hr at 06/26/15 1130 75 mL/hr at 06/26/15 1130  . acetaminophen (TYLENOL) tablet 650 mg  650 mg Oral Q6H PRN Gladstone Lighter, MD       Or  . acetaminophen (TYLENOL) suppository 650 mg  650 mg Rectal Q6H PRN Gladstone Lighter, MD      . antiseptic oral rinse solution (CORINZ)  7 mL Mouth Rinse QID Vilinda Boehringer, MD  7 mL at 06/26/15 1241  . chlorhexidine gluconate (PERIDEX) 0.12 % solution 15 mL  15 mL Mouth Rinse BID Vishal Mungal, MD   15 mL at 06/26/15 1242  . enoxaparin (LOVENOX) injection 40 mg  40 mg Subcutaneous Q24H Gladstone Lighter, MD   40 mg at 06/25/15 2316  . fentaNYL (SUBLIMAZE) bolus via infusion 25 mcg  25 mcg Intravenous Q1H PRN Vishal Mungal, MD   25 mcg at 06/26/15 1431  . fentaNYL 2569mg in NS 2530m(1026mml) infusion-PREMIX  25-400 mcg/hr Intravenous Continuous Vishal Mungal, MD 10 mL/hr at 06/26/15 1300 100 mcg/hr at 06/26/15 1300  . ipratropium (ATROVENT) 0.06 % nasal spray 2 spray  2 spray Nasal Daily PRN RadGladstone LighterD      . ipratropium-albuterol (DUONEB) 0.5-2.5 (3) MG/3ML nebulizer solution 3 mL  3 mL Nebulization Q6H Vishal Mungal, MD   3 mL at 06/26/15 1318  . levothyroxine (SYNTHROID, LEVOTHROID) tablet 50 mcg  50 mcg Oral QAC breakfast AruNicholes MangoD   50 mcg at 06/26/15 1238  . loratadine (CLARITIN) tablet 10 mg  10 mg Oral Daily RadGladstone LighterD   10 mg at 06/26/15 1239  . methylPREDNISolone sodium succinate (SOLU-MEDROL) 125 mg/2 mL injection 60 mg  60 mg Intravenous Q6H Aruna Gouru, MD      . midazolam (VERSED) injection 1 mg  1 mg Intravenous Q15 min PRN Vishal Mungal, MD   1 mg at 06/26/15 1429  .  midazolam (VERSED) injection 1 mg  1 mg Intravenous Q2H PRN Vishal Mungal, MD      . norepinephrine (LEVOPHED) '4mg'$  in D5W 250m59memix infusion  0-40 mcg/min Intravenous Titrated DanaMarijo Conception 18.8 mL/hr at 06/26/15 1405 5 mcg/min at 06/26/15 1405  . ondansetron (ZOFRAN) tablet 4 mg  4 mg Oral Q6H PRN RadhGladstone Lighter       Or  . ondansetron (ZOFRAN) injection 4 mg  4 mg Intravenous Q6H PRN RadhGladstone Lighter      . pantoprazole (PROTONIX) EC tablet 40 mg  40 mg Oral Daily RadhGladstone Lighter   40 mg at 06/26/15 1239  . piperacillin-tazobactam (ZOSYN) IVPB 3.375 g  3.375 g Intravenous 3 times per day ArunNicholes Mango   3.375 g at 06/26/15 1241  . pneumococcal 23 valent vaccine (PNU-IMMUNE) injection 0.5 mL  0.5 mL Intramuscular Tomorrow-1000 RadhGladstone Lighter      . sodium chloride 0.9 % injection 3 mL  3 mL Intravenous Q12H RadhGladstone Lighter   3 mL at 06/25/15 2200  . vancomycin (VANCOCIN) IVPB 750 mg/150 ml premix  750 mg Intravenous STAT ArunNicholes Mango   750 mg at 06/26/15 1431  . vancomycin (VANCOCIN) IVPB 750 mg/150 ml premix  750 mg Intravenous Q12H ArunNicholes Mango        Vital Signs: BP 129/56 mmHg  Pulse 81  Temp(Src) 98.6 F (37 C) (Rectal)  Resp 18  Ht '5\' 7"'$  (1.702 m)  Wt 66.044 kg (145 lb 9.6 oz)  BMI 22.80 kg/m2  SpO2 100% Filed Weights   06/25/15 1427 06/25/15 2013  Weight: 63.504 kg (140 lb) 66.044 kg (145 lb 9.6 oz)    Estimated body mass index is 22.8 kg/(m^2) as calculated from the following:   Height as of this encounter: '5\' 7"'$  (1.702 m).   Weight as of this encounter: 66.044 kg (145 lb 9.6 oz).  PERFORMANCE STATUS (ECOG) : 4 - Bedbound  PHYSICAL EXAM: Intubated and Sedated  NAD Hrt rrr no  mgr Lungs no wheezing , vent sounds Abd soft and NT nl BS Ext no mottling    LABS: CBC:    Component Value Date/Time   WBC 12.1* 06/26/2015 0245   WBC 12.8* 06/03/2014 0406   HGB 13.5 06/26/2015 0245   HGB 13.5 06/03/2014 0406   HCT 41.6  06/26/2015 0245   HCT 42.2 06/03/2014 0406   PLT 271 06/26/2015 0245   PLT 228 06/03/2014 0406   MCV 94.3 06/26/2015 0245   MCV 97 06/03/2014 0406   NEUTROABS 5.0 10/20/2014 1009   NEUTROABS 10.5* 06/03/2014 0406   LYMPHSABS 1.8 10/20/2014 1009   LYMPHSABS 0.6* 06/03/2014 0406   MONOABS 0.5 10/20/2014 1009   MONOABS 1.7* 06/03/2014 0406   EOSABS 0.4 10/20/2014 1009   EOSABS 0.0 06/03/2014 0406   BASOSABS 0.0 10/20/2014 1009   BASOSABS 0.0 06/03/2014 0406   Comprehensive Metabolic Panel:    Component Value Date/Time   NA 122* 06/26/2015 1020   NA 132* 06/03/2014 0406   K 4.3 06/26/2015 0245   K 4.6 06/03/2014 0406   CL 81* 06/26/2015 0245   CL 96* 06/03/2014 0406   CO2 35* 06/26/2015 0245   CO2 30 06/03/2014 0406   BUN 11 06/26/2015 0245   BUN 29* 06/03/2014 0406   CREATININE 0.73 06/26/2015 0245   CREATININE 0.84 02/06/2015 1549   CREATININE 0.98 06/03/2014 0406   GLUCOSE 133* 06/26/2015 0245   GLUCOSE 87 06/03/2014 0406   CALCIUM 8.3* 06/26/2015 0245   CALCIUM 8.7 06/03/2014 0406   AST 23 06/24/2015 0944   AST 43* 06/01/2014 2202   ALT 12 06/24/2015 0944   ALT 24 06/01/2014 2202   ALKPHOS 75 06/24/2015 0944   ALKPHOS 82 06/01/2014 2202   BILITOT 0.6 06/24/2015 0944   BILITOT 0.7 06/01/2014 2202   PROT 6.8 06/24/2015 0944   PROT 8.1 06/01/2014 2202   ALBUMIN 4.0 06/24/2015 0944   ALBUMIN 4.1 06/01/2014 2202       CT chest w CM 06/25/15:   The left lung is well aerated without focal infiltrate or sizable effusion. The right lung is also well aerated but demonstrates patchy changes in the posterior aspect of the right upper lobe. Some of these the appearance of early infiltrate although a more organized 15 mm mildly spiculated lesion is noted in the upper lobe best seen on image number 19 of series 3. Calcifications are noted in the thoracic aorta and its branches eavy coronary calcifications are noted particularly in the left anterior descending coronary  artery.  No significant lymphadenopathy is seen.. A left renal cyst is noted. The osseous structures are within normal Limits.  Somewhat spiculated mass lesion in the right upper lobe with some associated right upper lobe infiltrative changes. This must be viewed with a degree of suspicion for underlying neoplasm. Continued workup is recommended. This lesion would be amenable to percutaneous biopsy if clinically indicated.    CT Head wo CM 06/25/15: 1. No acute intracranial abnormality. 2. Generalized cerebral volume loss and chronic small vessel ischemic change.    CXR 06/26/15: Endotracheal tube is appropriately positioned. Left IJ central line tip terminates over the mid SVC. Heart size normal. Lungs are grossly clear. Right upper lobe nodule is not as well seen as on the prior exam, possibly obscured by support apparatus. Nasogastric tube tip terminates below the level of the diaphragms but is not included in the field of view.No new acute finding. Support apparatus as above.  ___________________________________________________________________ More than 50% of  the visit was spent in counseling/coordination of care: Yes  Time Spent: 80 minutes

## 2015-06-26 NOTE — Progress Notes (Signed)
Spoke to Dr Otila Back from [place]. Orders received

## 2015-06-26 NOTE — Consult Note (Signed)
ONCOLOGY CONSULTATION NOTE -  Reason for Consultation: Newly diagnosed right upper lobe spiculated lung nodule.       HISTORY OF PRESENT ILLNESS:  HISTORY OF PRESENT ILLNESS: Patient currently on mechanical ventilation, sedated. History obtained from chart record and discussion with patient's wife at bedside. Aaron Hendrix is a 81 -year-old gentleman with past medical history significant for chronic obstructive pulmonary disease, hypertension, irritable bowel syndrome, mild dementia who has been admitted to the hospital yesterday with altered mental status. He was found to have hyponatremia with serum sodium of 122. Patient has been sleeping mostly for the past week, oral intake also has been poor. Since admission to hospital, patient's condition worsened today and he reportedly was obtunded and had high pCO2 of 162 and has been intubated by pulmonologist Dr.Mungal for respiratory failure. CT scan of the head was negative for acute intracranial abnormality. CT scan of the chest reported somewhat spiculated mass lesion in the right upper lobe with some associated right upper lobe infiltrative changes raising possibility of neoplasm versus other etiology. Patient's wife present at bedside states that he has not had known cancer in the past.   PAST MEDICAL HISTORY:   Past Medical History  Diagnosis Date  . COPD with asthma   . Diabetes mellitus     Diet control   . Hyperlipidemia   . Carpal tunnel syndrome   . DJD (degenerative joint disease), cervical   . Vitamin D deficiency   . Tremor, essential   . Internal hemorrhoids   . Personal history of colonic polyps     adenomatous  . IBS (irritable bowel syndrome)   . Peptic ulcer   . Duodenal ulcer, with partial obstruction 08/10/2012  . Numbness and tingling in right hand     started 2 yeas ago  . Hypertension     no medicine needed  . Memory deficit 12/16/2013  .  Essential and other specified forms of tremor 12/16/2013  . Polyneuropathy in diabetes(357.2)     PAST SURGICAL HISTORY:   Past Surgical History  Procedure Laterality Date  . Anal fissure repair    . Colonoscopy w/ biopsies and polypectomy  8/03, 6/05, 7/09, 9/10    internal hemorrhoids, tubular adenomas, mucosa & lymphoid nodules  . Upper gastrointestinal endoscopy  3/05, 7/09, 9/10,2013    gastritis, duodenitis  . Total hip arthroplasty Right 11/13/2012    Procedure: TOTAL HIP ARTHROPLASTY ANTERIOR APPROACH; Surgeon: Mcarthur Rossetti, MD; Location: [facility]; Service: Orthopedics; Laterality: Right; Right total hip arthroplasty  . Ankle surgery Right     right- pins placed in    SOCIAL HISTORY:   Social History  Substance Use Topics  . Smoking status: Former Smoker    Types: Cigarettes    Quit date: 07/02/1963  . Smokeless tobacco: Never Used  . Alcohol Use: 0.0 oz/week    0 Standard drinks or equivalent per week     Comment: occasional    FAMILY HISTORY:   Family History  Problem Relation Age of Onset  . Colon cancer Neg Hx   . Esophageal cancer Neg Hx   . Rectal cancer Neg Hx   . Stomach cancer Neg Hx   . Leukemia Maternal Aunt   . Thyroid cancer Daughter 79  . Diabetes Son     DRUG ALLERGIES:   Allergies  Allergen Reactions  . Fexofenadine Other (See Comments)  . Quinapril Hcl Other (See Comments)    Unknown   . Latex Rash    REVIEW OF  SYSTEMS:   Review of Systems  Unable to obtain at this time. Patient sedated, on mechanical ventilation.  MEDICATIONS AT HOME:   Prior to Admission medications   Medication Sig      acetaminophen (TYLENOL) 500 MG tablet Take 500 mg by mouth every 6 (six) hours as needed.      Docusate Calcium (STOOL SOFTENER PO) Take by mouth.      ipratropium (ATROVENT) 0.06 %  nasal spray Place 2 sprays into the nose daily as needed.       levothyroxine (SYNTHROID, LEVOTHROID) 50 MCG tablet Take 50 mcg by mouth daily before breakfast.      loratadine (CLARITIN) 10 MG tablet Take 10 mg by mouth daily.      pantoprazole (PROTONIX) 40 MG tablet TAKE 1 TABLET BY MOUTH ONCE DAILY, 30-60MINUTES BEFORE A MEAL.      saccharomyces boulardii (FLORASTOR) 250 MG capsule Take 250 mg by mouth daily.      traMADol (ULTRAM) 50 MG tablet Take by mouth at bedtime as needed.        PHYSICAL EXAM: VITALS: 100.9, 73, 18, 125/58, 100% on mechanical ventilation. GENERAL: Patient is unresponsive, poorly nourished individual, in no acute distress. No icterus or pallor.  HEENT: normocephalic, atraumatic. No cervical adenopathy. CVS: J0-K9, regular, systolic murmur heard.  LUNGS: Bilateral diminished breath sounds, no rhonchi or crepitations.  ABDOMEN: Soft, nontender, no organomegaly clinically.  EXTREMITIES: No pedal edema or cyanosis.  NEURO: Patient obtunded, sedated on mechanical ventilation.  SKIN: No obvious rash or major bruising.   LABORATORY PANEL:   CBC  Last Labs      Recent Labs Lab 06/25/15 1426  WBC 10.2  HGB 13.7  HCT 42.1  PLT 284     ------------------------------------------------------------------------------------------------------------------  Chemistries   Last Labs      Recent Labs Lab 06/24/15 0944  06/25/15 1426  NA 123* < > 118*  K 4.3 --  4.2  CL 79* --  77*  CO2 38* --  31  GLUCOSE 118* --  237*  BUN 11 --  12  CREATININE 0.78 --  0.85  CALCIUM 9.3 --  8.3*  AST 23 --  --   ALT 12 --  --   ALKPHOS 75 --  --   BILITOT 0.6 --  --   < > = values in this interval not displayed.   ------------------------------------------------------------------------------------------------------------------  Cardiac Enzymes  Last Labs      Recent  Labs Lab 06/25/15 1426  TROPONINI <0.03     ------------------------------------------------------------------------------------------------------------------ RADIOLOGY: RADIOLOGY:      Dg Chest 2 View 06/25/2015 CLINICAL DATA: Hyponatremia. Weakness. EXAM: CHEST 2 VIEW COMPARISON: 06/01/2014 and 04/25/2012 FINDINGS: There is a vague nodular appearing density in the right upper lobe between the anterior aspects of the right second and third ribs. There is adjacent oxygen 2 being. This could that be artifactual. Lungs are otherwise clear. Heart size and vascularity are normal. Interposed bowel under the right hemidiaphragm, unchanged. No acute osseous abnormality. IMPRESSION: Vague density in the right upper lobe. This may be artifactual. I recommend repeat AP or PA chest x-ray prior to discharge. Electronically Signed By: Lorriane Shire M.D. On: 06/25/2015 16:27   Ct Head Wo Contrast 06/25/2015 CLINICAL DATA: Altered mental status. Weakness. Hyponatremia. EXAM: CT HEAD WITHOUT CONTRAST TECHNIQUE: Contiguous axial images were obtained from the base of the skull through the vertex without intravenous contrast. COMPARISON: 12/02/2008 head CT. 12/21/2013 brain MRI. FINDINGS: No evidence of parenchymal hemorrhage or extra-axial fluid collection.  No mass lesion, mass effect, or midline shift. No CT evidence of acute infarction. Stable generalized cerebral volume loss. Cerebral ventricle size is are stable and concordant with the degree of cerebral volume loss. Atherosclerotic intracranial arteries. Nonspecific subcortical and periventricular white matter hypodensity, most in keeping with chronic small vessel ischemic change. The visualized paranasal sinuses are essentially clear. The mastoid air cells are unopacified. No evidence of calvarial fracture. IMPRESSION: 1. No acute intracranial abnormality. 2. Generalized cerebral volume loss and chronic small vessel ischemic  change. Electronically Signed By: Ilona Sorrel M.D. On: 06/25/2015 17:52   Ct Chest W Contrast 06/25/2015 CLINICAL DATA: Abnormal chest x-ray EXAM: CT CHEST WITH CONTRAST TECHNIQUE: Multidetector CT imaging of the chest was performed during intravenous contrast administration. CONTRAST: 24m OMNIPAQUE IOHEXOL 300 MG/ML SOLN COMPARISON: Plain film from earlier in the same day FINDINGS: The left lung is well aerated without focal infiltrate or sizable effusion. The right lung is also well aerated but demonstrates patchy changes in the posterior aspect of the right upper lobe. Some of these the appearance of early infiltrate although a more organized 15 mm mildly spiculated lesion is noted in the upper lobe best seen on image number 19 of series 3. No other focal lesions are noted. The thoracic inlet is within normal limits. Calcifications are noted in the thoracic aorta and its branches although no aneurysmal dilatation or dissection is seen. Heavy coronary calcifications are noted particularly in the left anterior descending coronary artery. The pulmonary artery demonstrates a normal branching pattern without definitive filling defect. No significant lymphadenopathy is seen. Scanning into the upper abdomen reveals no acute abnormality. A left renal cyst is noted. The osseous structures are within normal limits. IMPRESSION: Somewhat spiculated mass lesion in the right upper lobe with some associated right upper lobe infiltrative changes. This must be viewed with a degree of suspicion for underlying neoplasm. Continued workup is recommended. This lesion would be amenable to percutaneous biopsy if clinically indicated. Electronically Signed By: MInez CatalinaM.D. On: 06/25/2015 17:51     EKG:   Orders placed or performed during the hospital encounter of 06/25/15  . EKG 12-Lead  . EKG 12-Lead  . ED EKG  . ED EKG    IMPRESSION AND PLAN:  IMPRESSION /  RECOMMENDATIONS: 81year old gentleman with past medical history as described above admitted with progressive altered mental status, hyponatremia and has had worsening condition today with obtundation, CO2 retention and is currently on mechanical ventilation for acute respiratory failure. Patient is also having possible infection with sepsis given fever, altered mental status, leukocytosis and is currently on Zosyn and Vancomycin. CT scan of the chest reported somewhat spiculated mass lesion ~15 mm in the right upper lobe with some associated right upper lobe infiltrative changes raising possibility of neoplasm versus other etiology. Have explained this to patient's wife present at bedside about CT scan findings, and that the small lesion (with no other lymphadenopathy or metastatic disease) is unlikely to be the cause of hyponatremia. Nephrology is following for hyponatremia management. Once his acute illness improves and he has been extubated and is stable, he will need further workup including consideration of biopsy given possibility of lung cancer. She is agreeable to this plan. Oncology will continue to follow intermittently as indicated. Thank you for the referral, please feel to to contact me if any additional questions.

## 2015-06-26 NOTE — Progress Notes (Signed)
Notified e link of patient's decreased uop.

## 2015-06-26 NOTE — Plan of Care (Addendum)
Problem: Discharge Progression Outcomes Goal: Discharge plan in place and appropriate Outcome: Progressing Pt is from home with wife.  Hx of COPD, HTN, HLD, vitamin D deficiency, continue on home medications High Fall Risk  Goal: Other Discharge Outcomes/Goals Outcome: Progressing Pt is lethargic, wakes up to voice and touch. Alert and oriented, no c/o pain at this time. Impulsive, family at bedside, supportive. Receiving fluids at 100 ml/hr, q6 hour sodium checks with improvement.

## 2015-06-26 NOTE — Progress Notes (Signed)
Initial Nutrition Assessment    INTERVENTION:   EN: received verbal order from MD Ramachandran to start TF; recommend starting Vital High Protein at rate of 20 ml/hr with goal rate of 60 ml/hr providing 1440 kcals,127 g of protein and 1210 mL of free water. Continue to assess   NUTRITION DIAGNOSIS:   Inadequate oral intake related to acute illness as evidenced by NPO status.   GOAL:   Provide needs based on ASPEN/SCCM guidelines  MONITOR:    (Energy Intake, Anthropometrics, Electrolyte/Renal Profile, Glucose Profile, Digestive system)  REASON FOR ASSESSMENT:   Ventilator    ASSESSMENT:    Pt admitted with AMS due to hyponatremia, acute bronchitis; rapid response called this AM, pt unresponsive, respiratory failure requiring intubation; pt with CT chest showing spiculated mass lesion in RUL suspicious for neoplasm, oncology consult pending  Past Medical History  Diagnosis Date  . COPD with asthma   . Diabetes mellitus     Diet control   . Hyperlipidemia   . Carpal tunnel syndrome   . DJD (degenerative joint disease), cervical   . Vitamin D deficiency   . Tremor, essential   . Internal hemorrhoids   . Personal history of colonic polyps     adenomatous  . IBS (irritable bowel syndrome)   . Peptic ulcer   . Duodenal ulcer, with partial obstruction 08/10/2012  . Numbness and tingling in right hand     started 2 yeas ago  . Hypertension     no medicine needed  . Memory deficit 12/16/2013  . Essential and other specified forms of tremor 12/16/2013  . Polyneuropathy in diabetes(357.2)      Diet Order:  Diet NPO time specified  Skin:  Reviewed, no issues   Digestive System: OG in place  Last BM:  9/28   Nutrition Focused physical exam:  Unable to complete Nutrition-Focused physical exam at this time.    Electrolyte and Renal Profile:  Recent Labs Lab 06/24/15 0944  06/25/15 1426 06/25/15 2015 06/26/15 0245 06/26/15 1020  BUN 11  --  12  --  11  --    CREATININE 0.78  --  0.85  --  0.73  --   NA 123*  < > 118* 123* 124* 122*  K 4.3  --  4.2  --  4.3  --   < > = values in this interval not displayed. Glucose Profile:   Recent Labs  06/26/15 1509  GLUCAP 172*   Nutritional Anemia Profile:  CBC Latest Ref Rng 06/26/2015 06/25/2015 10/20/2014  WBC 3.8 - 10.6 K/uL 12.1(H) 10.2 7.8  Hemoglobin 13.0 - 18.0 g/dL 13.5 13.7 14.0  Hematocrit 40.0 - 52.0 % 41.6 42.1 43.4  Platelets 150 - 440 K/uL 271 284 283.0    Protein Profile:   Recent Labs Lab 06/24/15 0944  ALBUMIN 4.0    Meds; NS at 75 ml/hr, solumedrol  Height:   Ht Readings from Last 1 Encounters:  06/25/15 '5\' 7"'$  (1.702 m)    Weight:   Wt Readings from Last 1 Encounters:  06/25/15 145 lb 9.6 oz (66.044 kg)    Filed Weights   06/25/15 1427 06/25/15 2013  Weight: 140 lb (63.504 kg) 145 lb 9.6 oz (66.044 kg)   Wt Readings from Last 10 Encounters:  06/25/15 145 lb 9.6 oz (66.044 kg)  06/10/15 141 lb 8 oz (64.184 kg)  02/06/15 135 lb 6 oz (61.406 kg)  09/23/14 127 lb 8 oz (57.834 kg)  06/19/14 123 lb 4 oz (  55.906 kg)  05/22/14 126 lb (57.153 kg)  02/11/14 128 lb (58.06 kg)  02/06/14 128 lb (58.06 kg)  12/16/13 121 lb (54.885 kg)  11/26/13 120 lb 8 oz (54.658 kg)     BMI:  Body mass index is 22.8 kg/(m^2).  Estimated Nutritional Needs:   Kcal:  1467 kcals (BEE 1314, Ve; 7.7, Tmax: 37) using current wt of 66 kg  Protein:  99-132 g (1.5-2.0 g/kg)   Fluid:  1650-1950 mL (25-20 ml/kg)   HIGH Care Level  [name] MS, RD, LDN [phone] Pager

## 2015-06-26 NOTE — Consult Note (Signed)
Date: 06/26/2015                  Patient Name:  Aaron Hendrix  MRN: [id number]  DOB: 09/04/1930  Age / Sex: 81 y.o., male         PCP: [name], MD                 Service Requesting Consult:  internal medicine                  Reason for Consult:  hyponatremia             History of Present Illness: Patient is a 81 y.o. male with medical problems of COPD, and diet-controlled diabetes, DJD, hypertension, polyneuropathy, dementia who was admitted to [facility] on 06/25/2015 for evaluation of altered mental status.  All information is obtained from the chart as patient is obtunded and is not able to provide any information. He is not arousable to sternal rub. He was transferred from the floor this morning for altered mental status His ABG showed pH of 6.99, PCO2 of 162 Sodium level at the time of admission was 123. Baseline sodium is 137 Serum creatinine is 0.78 He underwent CT of the chest with IV contrast which showed spiculated mass lesion in the right upper lobe with associated right upper lobe infiltrative changes suspicious for neoplasm.    Medications: Outpatient medications: Prescriptions prior to admission  Medication Sig Dispense Refill Last Dose  . acetaminophen (TYLENOL) 500 MG tablet Take 500 mg by mouth every 6 (six) hours as needed.   Past Week at Unknown time  . Docusate Calcium (STOOL SOFTENER PO) Take by mouth.   prn at prn  . ipratropium (ATROVENT) 0.06 % nasal spray Place 2 sprays into the nose daily as needed.    prn at prn  . levothyroxine (SYNTHROID, LEVOTHROID) 50 MCG tablet Take 50 mcg by mouth daily before breakfast.   06/25/2015 at Unknown time  . loratadine (CLARITIN) 10 MG tablet Take 10 mg by mouth daily.   prn at prn  . pantoprazole (PROTONIX) 40 MG tablet TAKE 1 TABLET BY MOUTH ONCE DAILY, 30-60MINUTES BEFORE A MEAL. 30 tablet 5 06/25/2015 at Unknown time  . traMADol (ULTRAM) 50 MG tablet Take by mouth at bedtime as needed.   Past Week at Unknown  time  . Vitamin D, Ergocalciferol, (DRISDOL) 50000 UNITS CAPS capsule Take 50,000 Units by mouth every 7 (seven) days. On Friday     . saccharomyces boulardii (FLORASTOR) 250 MG capsule Take 250 mg by mouth daily.   Not Taking at Unknown time    Current medications: Current Facility-Administered Medications  Medication Dose Route Frequency Maribell Demeo Last Rate Last Dose  . 0.9 %  sodium chloride infusion   Intravenous Continuous Vishal Mungal, MD      . acetaminophen (TYLENOL) tablet 650 mg  650 mg Oral Q6H PRN Gladstone Lighter, MD       Or  . acetaminophen (TYLENOL) suppository 650 mg  650 mg Rectal Q6H PRN Gladstone Lighter, MD      . antiseptic oral rinse solution (CORINZ)  7 mL Mouth Rinse QID Vishal Mungal, MD      . chlorhexidine gluconate (PERIDEX) 0.12 % solution 15 mL  15 mL Mouth Rinse BID Vishal Mungal, MD      . enoxaparin (LOVENOX) injection 40 mg  40 mg Subcutaneous Q24H Gladstone Lighter, MD   40 mg at 06/25/15 2316  . fentaNYL (SUBLIMAZE) bolus via infusion  25 mcg  25 mcg Intravenous Q1H PRN Vishal Mungal, MD      . fentaNYL (SUBLIMAZE) injection 50 mcg  50 mcg Intravenous Once Vishal Mungal, MD      . fentaNYL 2558mg in NS 2548m(1029mml) infusion-PREMIX  25-400 mcg/hr Intravenous Continuous Vishal Mungal, MD 7.5 mL/hr at 06/26/15 1214 75 mcg/hr at 06/26/15 1214  . ipratropium (ATROVENT) 0.06 % nasal spray 2 spray  2 spray Nasal Daily PRN RadGladstone LighterD      . ipratropium-albuterol (DUONEB) 0.5-2.5 (3) MG/3ML nebulizer solution 3 mL  3 mL Nebulization Q6H Vishal Mungal, MD      . levothyroxine (SYNTHROID, LEVOTHROID) tablet 50 mcg  50 mcg Oral QAC breakfast AruNicholes MangoD      . loratadine (CLARITIN) tablet 10 mg  10 mg Oral Daily RadGladstone LighterD      . methylPREDNISolone sodium succinate (SOLU-MEDROL) 125 mg/2 mL injection 125 mg  125 mg Intravenous STAT Aruna Gouru, MD      . methylPREDNISolone sodium succinate (SOLU-MEDROL) 125 mg/2 mL injection 60 mg  60 mg  Intravenous Q6H Aruna Gouru, MD      . midazolam (VERSED) injection 1 mg  1 mg Intravenous Q15 min PRN Vishal Mungal, MD   1 mg at 06/26/15 1102  . midazolam (VERSED) injection 1 mg  1 mg Intravenous Q2H PRN Vishal Mungal, MD      . norepinephrine (LEVOPHED) 4-5 MG/250ML-% infusion           . ondansetron (ZOFRAN) tablet 4 mg  4 mg Oral Q6H PRN RadGladstone LighterD       Or  . ondansetron (ZOFRAN) injection 4 mg  4 mg Intravenous Q6H PRN RadGladstone LighterD      . pantoprazole (PROTONIX) EC tablet 40 mg  40 mg Oral Daily RadGladstone LighterD      . piperacillin-tazobactam (ZOSYN) IVPB 3.375 g  3.375 g Intravenous 3 times per day AruNicholes MangoD      . pneumococcal 23 valent vaccine (PNU-IMMUNE) injection 0.5 mL  0.5 mL Intramuscular Tomorrow-1000 RadGladstone LighterD      . sodium chloride 0.9 % injection 3 mL  3 mL Intravenous Q12H RadGladstone LighterD   3 mL at 06/25/15 2200  . vancomycin (VANCOCIN) IVPB 750 mg/150 ml premix  750 mg Intravenous STAT Aruna Gouru, MD      . vancomycin (VANCOCIN) IVPB 750 mg/150 ml premix  750 mg Intravenous Q12H AruNicholes MangoD          Allergies: Allergies  Allergen Reactions  . Fexofenadine Other (See Comments)  . Quinapril Hcl Other (See Comments)    Unknown   . Latex Rash      Past Medical History: Past Medical History  Diagnosis Date  . COPD with asthma   . Diabetes mellitus     Diet control   . Hyperlipidemia   . Carpal tunnel syndrome   . DJD (degenerative joint disease), cervical   . Vitamin D deficiency   . Tremor, essential   . Internal hemorrhoids   . Personal history of colonic polyps     adenomatous  . IBS (irritable bowel syndrome)   . Peptic ulcer   . Duodenal ulcer, with partial obstruction 08/10/2012  . Numbness and tingling in right hand     started 2 yeas ago  . Hypertension     no medicine needed  . Memory deficit 12/16/2013  . Essential and other specified forms of tremor 12/16/2013  .  Polyneuropathy in  diabetes(357.2)      Past Surgical History: Past Surgical History  Procedure Laterality Date  . Anal fissure repair    . Colonoscopy w/ biopsies and polypectomy  8/03, 6/05, 7/09, 9/10    internal hemorrhoids, tubular adenomas, mucosa & lymphoid nodules  . Upper gastrointestinal endoscopy  3/05, 7/09, 9/10,2013    gastritis, duodenitis  . Total hip arthroplasty Right 11/13/2012    Procedure: TOTAL HIP ARTHROPLASTY ANTERIOR APPROACH;  Surgeon: Mcarthur Rossetti, MD;  Location: [facility];  Service: Orthopedics;  Laterality: Right;  Right total hip arthroplasty  . Ankle surgery Right     right- pins placed in     Family History: Family History  Problem Relation Age of Onset  . Colon cancer Neg Hx   . Esophageal cancer Neg Hx   . Rectal cancer Neg Hx   . Stomach cancer Neg Hx   . Leukemia Maternal Aunt   . Thyroid cancer Daughter 79  . Diabetes Son      Social History: Social History   Social History  . Marital Status: Married    Spouse Name: N/A  . Number of Children: 3  . 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 Smoking status: Former Smoker    Types: Cigarettes    Quit date: 07/02/1963  . Smokeless tobacco: Never Used  . Alcohol Use: 0.0 oz/week    0 Standard drinks or equivalent per week     Comment: occasional  . Drug Use: No  . Sexual Activity: Not on file   Other Topics Concern  . Not on file   Social History Narrative   Veteran [employer]     Review of Systems: Not available Gen:  HEENT:  CV:  Resp:  GI: GU :  MS:  Derm:   Psych: Heme:  Neuro:  Endocrine  Vital Signs: Blood pressure 85/50, pulse 97, temperature 94.8 F (34.9 C), temperature source Rectal, resp. rate 16, height '5\' 7"'$  (1.702 m), weight 66.044 kg (145 lb 9.6 oz), SpO2 100 %.   Intake/Output Summary (Last 24 hours) at 06/26/15 1219 Last data filed at 06/26/15 0147  Gross per 24 hour  Intake       0 ml  Output    100 ml  Net   -100 ml    Weight trends: Filed Weights   06/25/15 1427 06/25/15 2013  Weight: 63.504 kg (140 lb) 66.044 kg (145 lb 9.6 oz)    Physical Exam: General:  critically ill-appearing, no distress   HEENT  pupils 2 mm, round, conjunctival edema   Neck:  supple   Lungs:  normal respiratory effort, decreased breath sounds at bases   Heart::  irregular rhythm, no rub   Abdomen:  soft, nontender   Extremities:  no peripheral edema   Neurologic:  not arousable to verbal or tactile stimuli   Skin:  decreased turgor, no acute rashes   Access:   Foley:        Lab results: Basic Metabolic Panel:  Recent Labs Lab 06/24/15 0944  06/25/15 1426 06/25/15 2015 06/26/15 0245 06/26/15 1020  NA 123*  < > 118* 123* 124* 122*  K 4.3  --  4.2  --  4.3  --   CL 79*  --  77*  --  81*  --   CO2 38*  --  31  --  35*  --  GLUCOSE 118*  --  237*  --  133*  --   BUN 11  --  12  --  11  --   CREATININE 0.78  --  0.85  --  0.73  --   CALCIUM 9.3  --  8.3*  --  8.3*  --   < > = values in this interval not displayed.  Liver Function Tests:  Recent Labs Lab 06/24/15 0944  AST 23  ALT 12  ALKPHOS 75  BILITOT 0.6  PROT 6.8  ALBUMIN 4.0   No results for input(s): LIPASE, AMYLASE in the last 168 hours. No results for input(s): AMMONIA in the last 168 hours.  CBC:  Recent Labs Lab 06/25/15 1426 06/26/15 0245  WBC 10.2 12.1*  HGB 13.7 13.5  HCT 42.1 41.6  MCV 93.6 94.3  PLT 284 271    Cardiac Enzymes:  Recent Labs Lab 06/25/15 1426  TROPONINI <0.03    BNP: Invalid input(s): POCBNP  CBG: No results for input(s): GLUCAP in the last 168 hours.  Microbiology: Recent Results (from the past 720 hour(s))  MRSA PCR Screening     Status: None   Collection Time: 06/26/15  9:50 AM  Result Value Ref Range Status   MRSA by PCR NEGATIVE NEGATIVE Final    Comment:        The GeneXpert MRSA Assay (FDA approved for NASAL specimens only), is one  component of a comprehensive MRSA colonization surveillance program. It is not intended to diagnose MRSA infection nor to guide or monitor treatment for MRSA infections.      Coagulation Studies: No results for input(s): LABPROT, INR in the last 72 hours.  Urinalysis:  Recent Labs  06/25/15 2144  COLORURINE YELLOW*  LABSPEC 1.049*  PHURINE 6.0  GLUCOSEU 150*  HGBUR 1+*  BILIRUBINUR NEGATIVE  KETONESUR NEGATIVE  PROTEINUR 100*  NITRITE NEGATIVE  LEUKOCYTESUR NEGATIVE      Imaging: Dg Chest 1 View  06/26/2015   CLINICAL DATA:  Central line placement, intubation  EXAM: CHEST 1 VIEW  COMPARISON:  06/25/2015 chest radiograph and CT  FINDINGS: Endotracheal tube is appropriately positioned. Left IJ central line tip terminates over the mid SVC. Heart size normal. Lungs are grossly clear. Right upper lobe nodule is not as well seen as on the prior exam, possibly obscured by support apparatus. Nasogastric tube tip terminates below the level of the diaphragms but is not included in the field of view.  IMPRESSION: No new acute finding.  Support apparatus as above.   Electronically Signed   By: Conchita Paris M.D.   On: 06/26/2015 11:23   Dg Chest 2 View  06/25/2015   CLINICAL DATA:  Hyponatremia.  Weakness.  EXAM: CHEST  2 VIEW  COMPARISON:  06/01/2014 and 04/25/2012  FINDINGS: There is a vague nodular appearing density in the right upper lobe between the anterior aspects of the right second and third ribs. There is adjacent oxygen 2 being. This could that be artifactual. Lungs are otherwise clear. Heart size and vascularity are normal. Interposed bowel under the right hemidiaphragm, unchanged. No acute osseous abnormality.  IMPRESSION: Vague density in the right upper lobe. This may be artifactual. I recommend repeat AP or PA chest x-ray prior to discharge.   Electronically Signed   By: Lorriane Shire M.D.   On: 06/25/2015 16:27   Dg Abd 1 View  06/26/2015   CLINICAL DATA:  Evaluate OG  tube placement.  EXAM: ABDOMEN - 1 VIEW  COMPARISON:  06/04/2014  FINDINGS: The OG tube tip is in the stomach. Side port is below the GE junction. No dilated loops of bowel identified. Gas and stool noted within the colon up to the rectum.  IMPRESSION: 1. OG tube tip is in the stomach. 2. Nonobstructive bowel gas pattern.   Electronically Signed   By: Kerby Moors M.D.   On: 06/26/2015 11:49   Ct Head Wo Contrast  06/25/2015   CLINICAL DATA:  Altered mental status.  Weakness.  Hyponatremia.  EXAM: CT HEAD WITHOUT CONTRAST  TECHNIQUE: Contiguous axial images were obtained from the base of the skull through the vertex without intravenous contrast.  COMPARISON:  12/02/2008 head CT.  12/21/2013 brain MRI.  FINDINGS: No evidence of parenchymal hemorrhage or extra-axial fluid collection. No mass lesion, mass effect, or midline shift. No CT evidence of acute infarction. Stable generalized cerebral volume loss. Cerebral ventricle size is are stable and concordant with the degree of cerebral volume loss. Atherosclerotic intracranial arteries. Nonspecific subcortical and periventricular white matter hypodensity, most in keeping with chronic small vessel ischemic change.  The visualized paranasal sinuses are essentially clear. The mastoid air cells are unopacified. No evidence of calvarial fracture.  IMPRESSION: 1. No acute intracranial abnormality. 2. Generalized cerebral volume loss and chronic small vessel ischemic change.   Electronically Signed   By: Ilona Sorrel M.D.   On: 06/25/2015 17:52   Ct Chest W Contrast  06/25/2015   CLINICAL DATA:  Abnormal chest x-ray  EXAM: CT CHEST WITH CONTRAST  TECHNIQUE: Multidetector CT imaging of the chest was performed during intravenous contrast administration.  CONTRAST:  29m OMNIPAQUE IOHEXOL 300 MG/ML  SOLN  COMPARISON:  Plain film from earlier in the same day  FINDINGS: The left lung is well aerated without focal infiltrate or sizable effusion. The right lung is also well  aerated but demonstrates patchy changes in the posterior aspect of the right upper lobe. Some of these the appearance of early infiltrate although a more organized 15 mm mildly spiculated lesion is noted in the upper lobe best seen on image number 19 of series 3. No other focal lesions are noted.  The thoracic inlet is within normal limits. Calcifications are noted in the thoracic aorta and its branches although no aneurysmal dilatation or dissection is seen. Heavy coronary calcifications are noted particularly in the left anterior descending coronary artery. The pulmonary artery demonstrates a normal branching pattern without definitive filling defect. No significant lymphadenopathy is seen.  Scanning into the upper abdomen reveals no acute abnormality. A left renal cyst is noted. The osseous structures are within normal limits.  IMPRESSION: Somewhat spiculated mass lesion in the right upper lobe with some associated right upper lobe infiltrative changes. This must be viewed with a degree of suspicion for underlying neoplasm. Continued workup is recommended. This lesion would be amenable to percutaneous biopsy if clinically indicated.   Electronically Signed   By: MInez CatalinaM.D.   On: 06/25/2015 17:51      Assessment & Plan: Pt is a 81y.o. yo male with medical problems of COPD, and diet-controlled diabetes, DJD, hypertension, polyneuropathy, dementia [facility] altered mental status.   1. 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 Acute hypercarbic respiratory failure Will  need ventilator support 3. Lung malignancy As per pulmonary and oncology team

## 2015-06-26 NOTE — Progress Notes (Signed)
*  PRELIMINARY RESULTS* Echocardiogram 2D Echocardiogram has been performed.  Aaron Hendrix 06/26/2015, 8:39 PM

## 2015-06-26 NOTE — Consult Note (Signed)
PULMONARY / CRITICAL CARE MEDICINE   Name: Aaron Hendrix MRN: [id number] DOB: 1930-05-10    ADMISSION DATE:  06/25/2015 CONSULTATION DATE:  06/26/15  REFERRING MD :  Dr. Margaretmary Eddy  CHIEF COMPLAINT:     unresponsive   HISTORY OF PRESENT ILLNESS  History per wife  81 y.o. male with a known history of COPD, irritable bowel syndrome, hypertension, mild dementia presents from home secondary to altered mental status.  According to wife patient just had a physical exam done by his PCP about 10 days ago, 2 days ago they had his labs done and his PCP called 9/29 to repeat his sodium as it was very low on the labs. It was was 122. Patient has been sleeping mostly for the past week, which got worse 2 days ago. Since his sodium was low that would cause his sleepiness his PCP recommended coming to the hospital. His intake has been decreased. Does drink a lot of Dr. Malachi Bonds every day.  This morning noted to be mildly responsive only to painful stimuli, abg showed hypercarbia, transferred to the ICU.   CT Chest noted to have RUL spiculated lesion, possible malignancy. Patient is a former smoker about 0.5-1ppd x 10 years, quit 50 years ago.     SIGNIFICANT EVENTS  9/29>>admitted for low Na 9/30>>unresponsive, elevated co2, respiratory failure>>intubated     PAST MEDICAL HISTORY    :  Past Medical History  Diagnosis Date  . COPD with asthma   . Diabetes mellitus     Diet control   . Hyperlipidemia   . Carpal tunnel syndrome   . DJD (degenerative joint disease), cervical   . Vitamin D deficiency   . Tremor, essential   . Internal hemorrhoids   . Personal history of colonic polyps     adenomatous  . IBS (irritable bowel syndrome)   . Peptic ulcer   . Duodenal ulcer, with partial obstruction 08/10/2012  . Numbness and tingling in right hand     started 2 yeas ago  . Hypertension     no medicine needed  . Memory deficit 12/16/2013  . Essential and other specified forms of tremor  12/16/2013  . Polyneuropathy in diabetes(357.2)    Past Surgical History  Procedure Laterality Date  . Anal fissure repair    . Colonoscopy w/ biopsies and polypectomy  8/03, 6/05, 7/09, 9/10    internal hemorrhoids, tubular adenomas, mucosa & lymphoid nodules  . Upper gastrointestinal endoscopy  3/05, 7/09, 9/10,2013    gastritis, duodenitis  . Total hip arthroplasty Right 11/13/2012    Procedure: TOTAL HIP ARTHROPLASTY ANTERIOR APPROACH;  Surgeon: Mcarthur Rossetti, MD;  Location: [place];  Service: Orthopedics;  Laterality: Right;  Right total hip arthroplasty  . Ankle surgery Right     right- pins placed in   Prior to Admission medications   Medication Sig Start Date End Date Taking? Authorizing Provider  acetaminophen (TYLENOL) 500 MG tablet Take 500 mg by mouth every 6 (six) hours as needed.   Yes Historical Provider, MD  Docusate Calcium (STOOL SOFTENER PO) Take by mouth.   Yes Historical Provider, MD  ipratropium (ATROVENT) 0.06 % nasal spray Place 2 sprays into the nose daily as needed.    Yes Historical Provider, MD  levothyroxine (SYNTHROID, LEVOTHROID) 50 MCG tablet Take 50 mcg by mouth daily before breakfast.   Yes Reynold Bowen, MD  loratadine (CLARITIN) 10 MG tablet Take 10 mg by mouth daily.   Yes Historical Provider, MD  pantoprazole (  PROTONIX) 40 MG tablet TAKE 1 TABLET BY MOUTH ONCE DAILY, 30-60MINUTES BEFORE A MEAL. 06/02/15  Yes Gatha Mayer, MD  traMADol (ULTRAM) 50 MG tablet Take by mouth at bedtime as needed.   Yes Historical Provider, MD  Vitamin D, Ergocalciferol, (DRISDOL) 50000 UNITS CAPS capsule Take 50,000 Units by mouth every 7 (seven) days. On Friday   Yes Historical Provider, MD  saccharomyces boulardii (FLORASTOR) 250 MG capsule Take 250 mg by mouth daily.    Historical Provider, MD   Allergies  Allergen Reactions  . Fexofenadine Other (See Comments)  . Quinapril Hcl Other (See Comments)    Unknown   . Latex Rash     FAMILY HISTORY   Family  History  Problem Relation Age of Onset  . Colon cancer Neg Hx   . Esophageal cancer Neg Hx   . Rectal cancer Neg Hx   . Stomach cancer Neg Hx   . Leukemia Maternal Aunt   . Thyroid cancer Daughter 90  . Diabetes Son       SOCIAL HISTORY    reports that he quit smoking about 52 years ago. His smoking use included Cigarettes. He has never used smokeless tobacco. He reports that he drinks alcohol. He reports that he does not use illicit drugs.  Review of Systems  Unable to perform ROS: critical illness      VITAL SIGNS    Temp:  [94.6 F (34.8 C)-98.5 F (36.9 C)] 94.8 F (34.9 C) (09/30 1017) Pulse Rate:  [77-106] 97 (09/30 1017) Resp:  [12-22] 16 (09/30 1030) BP: (85-175)/(50-79) 85/50 mmHg (09/30 1030) SpO2:  [75 %-100 %] 100 % (09/30 1030) FiO2 (%):  [40 %] 40 % (09/30 1022) Weight:  [140 lb (63.504 kg)-145 lb 9.6 oz (66.044 kg)] 145 lb 9.6 oz (66.044 kg) (09/29 2013) HEMODYNAMICS:   VENTILATOR SETTINGS: Vent Mode:  [-] PRVC FiO2 (%):  [40 %] 40 % Set Rate:  [24 bmp] 24 bmp Vt Set:  [500 mL] 500 mL PEEP:  [5 cmH20] 5 cmH20 INTAKE / OUTPUT:  Intake/Output Summary (Last 24 hours) at 06/26/15 1203 Last data filed at 06/26/15 0147  Gross per 24 hour  Intake      0 ml  Output    100 ml  Net   -100 ml       PHYSICAL EXAM   Physical Exam  Constitutional:  Mildly thin appearing male, in moderate respiratory distress, unresponsive to verbal command, withdrawals from pain only.  Eyes: EOM are normal. Pupils are equal, round, and reactive to light. Right eye exhibits no discharge.  Neck: Normal range of motion.  Cardiovascular: Regular rhythm, normal heart sounds and intact distal pulses.   Pulmonary/Chest: He has no wheezes. He has no rales.  Respiratory failure.  Dec bs bilaterally, no wheezes, no rales.   Abdominal: Soft. Bowel sounds are normal.  Musculoskeletal: He exhibits no edema.  Neurological:  Unresponsive.   Skin: Skin is warm and dry.   Nursing note and vitals reviewed.      LABS   LABS:  CBC  Recent Labs Lab 06/25/15 1426 06/26/15 0245  WBC 10.2 12.1*  HGB 13.7 13.5  HCT 42.1 41.6  PLT 284 271   Coag's No results for input(s): APTT, INR in the last 168 hours. BMET  Recent Labs Lab 06/24/15 0944  06/25/15 1426 06/25/15 2015 06/26/15 0245 06/26/15 1020  NA 123*  < > 118* 123* 124* 122*  K 4.3  --  4.2  --  4.3  --   CL 79*  --  77*  --  81*  --   CO2 38*  --  31  --  35*  --   BUN 11  --  12  --  11  --   CREATININE 0.78  --  0.85  --  0.73  --   GLUCOSE 118*  --  237*  --  133*  --   < > = values in this interval not displayed. Electrolytes  Recent Labs Lab 06/24/15 0944 06/25/15 1426 06/26/15 0245  CALCIUM 9.3 8.3* 8.3*   Sepsis Markers No results for input(s): LATICACIDVEN, PROCALCITON, O2SATVEN in the last 168 hours. ABG  Recent Labs Lab 06/26/15 0850  PHART 6.99*  PCO2ART 162*  PO2ART 245*   Liver Enzymes  Recent Labs Lab 06/24/15 0944  AST 23  ALT 12  ALKPHOS 75  BILITOT 0.6  ALBUMIN 4.0   Cardiac Enzymes  Recent Labs Lab 06/25/15 1426  TROPONINI <0.03   Glucose No results for input(s): GLUCAP in the last 168 hours.   Recent Results (from the past 240 hour(s))  MRSA PCR Screening     Status: None   Collection Time: 06/26/15  9:50 AM  Result Value Ref Range Status   MRSA by PCR NEGATIVE NEGATIVE Final    Comment:        The GeneXpert MRSA Assay (FDA approved for NASAL specimens only), is one component of a comprehensive MRSA colonization surveillance program. It is not intended to diagnose MRSA infection nor to guide or monitor treatment for MRSA infections.      Current facility-administered medications:  .  0.9 %  sodium chloride infusion, , Intravenous, Continuous, Vishal Mungal, MD .  acetaminophen (TYLENOL) tablet 650 mg, 650 mg, Oral, Q6H PRN **OR** acetaminophen (TYLENOL) suppository 650 mg, 650 mg, Rectal, Q6H PRN, Gladstone Lighter,  MD .  antiseptic oral rinse solution (CORINZ), 7 mL, Mouth Rinse, QID, Vishal Mungal, MD .  chlorhexidine gluconate (PERIDEX) 0.12 % solution 15 mL, 15 mL, Mouth Rinse, BID, Vishal Mungal, MD .  enoxaparin (LOVENOX) injection 40 mg, 40 mg, Subcutaneous, Q24H, Gladstone Lighter, MD, 40 mg at 06/25/15 2316 .  fentaNYL (SUBLIMAZE) bolus via infusion 25 mcg, 25 mcg, Intravenous, Q1H PRN, Vishal Mungal, MD .  fentaNYL (SUBLIMAZE) injection 50 mcg, 50 mcg, Intravenous, Once, Vishal Mungal, MD .  fentaNYL 2518mg in NS 2539m(1054mml) infusion-PREMIX, 25-400 mcg/hr, Intravenous, Continuous, Vishal Mungal, MD, Last Rate: 5 mL/hr at 06/26/15 1104, 50 mcg/hr at 06/26/15 1104 .  ipratropium (ATROVENT) 0.06 % nasal spray 2 spray, 2 spray, Nasal, Daily PRN, RadGladstone LighterD .  ipratropium-albuterol (DUONEB) 0.5-2.5 (3) MG/3ML nebulizer solution 3 mL, 3 mL, Nebulization, Q6H, Vishal Mungal, MD .  levothyroxine (SYNTHROID, LEVOTHROID) tablet 50 mcg, 50 mcg, Oral, QAC breakfast, AruNicholes MangoD .  loratadine (CLARITIN) tablet 10 mg, 10 mg, Oral, Daily, RadGladstone LighterD .  methylPREDNISolone sodium succinate (SOLU-MEDROL) 125 mg/2 mL injection 125 mg, 125 mg, Intravenous, STAT, Aruna Gouru, MD .  methylPREDNISolone sodium succinate (SOLU-MEDROL) 125 mg/2 mL injection 60 mg, 60 mg, Intravenous, Q6H, Aruna Gouru, MD .  midazolam (VERSED) injection 1 mg, 1 mg, Intravenous, Q15 min PRN, Vishal Mungal, MD, 1 mg at 06/26/15 1102 .  midazolam (VERSED) injection 1 mg, 1 mg, Intravenous, Q2H PRN, Vishal Mungal, MD .  ondansetron (ZOFRAN) tablet 4 mg, 4 mg, Oral, Q6H PRN **OR** ondansetron (ZOFRAN) injection 4 mg, 4 mg, Intravenous, Q6H PRN, RadGladstone LighterD .  pantoprazole (PROTONIX) EC tablet 40 mg, 40 mg, Oral, Daily, Gladstone Lighter, MD .  piperacillin-tazobactam (ZOSYN) IVPB 3.375 g, 3.375 g, Intravenous, 3 times per day, Nicholes Mango, MD .  pneumococcal 23 valent vaccine (PNU-IMMUNE) injection 0.5 mL,  0.5 mL, Intramuscular, Tomorrow-1000, Gladstone Lighter, MD .  sodium chloride 0.9 % injection 3 mL, 3 mL, Intravenous, Q12H, Gladstone Lighter, MD, 3 mL at 06/25/15 2200 .  vancomycin (VANCOCIN) IVPB 750 mg/150 ml premix, 750 mg, Intravenous, STAT, Aruna Gouru, MD .  vancomycin (VANCOCIN) IVPB 750 mg/150 ml premix, 750 mg, Intravenous, Q12H, Nicholes Mango, MD  IMAGING    Dg Chest 1 View  06/26/2015   CLINICAL DATA:  Central line placement, intubation  EXAM: CHEST 1 VIEW  COMPARISON:  06/25/2015 chest radiograph and CT  FINDINGS: Endotracheal tube is appropriately positioned. Left IJ central line tip terminates over the mid SVC. Heart size normal. Lungs are grossly clear. Right upper lobe nodule is not as well seen as on the prior exam, possibly obscured by support apparatus. Nasogastric tube tip terminates below the level of the diaphragms but is not included in the field of view.  IMPRESSION: No new acute finding.  Support apparatus as above.   Electronically Signed   By: Conchita Paris M.D.   On: 06/26/2015 11:23   Dg Chest 2 View  06/25/2015   CLINICAL DATA:  Hyponatremia.  Weakness.  EXAM: CHEST  2 VIEW  COMPARISON:  06/01/2014 and 04/25/2012  FINDINGS: There is a vague nodular appearing density in the right upper lobe between the anterior aspects of the right second and third ribs. There is adjacent oxygen 2 being. This could that be artifactual. Lungs are otherwise clear. Heart size and vascularity are normal. Interposed bowel under the right hemidiaphragm, unchanged. No acute osseous abnormality.  IMPRESSION: Vague density in the right upper lobe. This may be artifactual. I recommend repeat AP or PA chest x-ray prior to discharge.   Electronically Signed   By: Lorriane Shire M.D.   On: 06/25/2015 16:27   Dg Abd 1 View  06/26/2015   CLINICAL DATA:  Evaluate OG tube placement.  EXAM: ABDOMEN - 1 VIEW  COMPARISON:  06/04/2014  FINDINGS: The OG tube tip is in the stomach. Side port is below the GE  junction. No dilated loops of bowel identified. Gas and stool noted within the colon up to the rectum.  IMPRESSION: 1. OG tube tip is in the stomach. 2. Nonobstructive bowel gas pattern.   Electronically Signed   By: Kerby Moors M.D.   On: 06/26/2015 11:49   Ct Head Wo Contrast  06/25/2015   CLINICAL DATA:  Altered mental status.  Weakness.  Hyponatremia.  EXAM: CT HEAD WITHOUT CONTRAST  TECHNIQUE: Contiguous axial images were obtained from the base of the skull through the vertex without intravenous contrast.  COMPARISON:  12/02/2008 head CT.  12/21/2013 brain MRI.  FINDINGS: No evidence of parenchymal hemorrhage or extra-axial fluid collection. No mass lesion, mass effect, or midline shift. No CT evidence of acute infarction. Stable generalized cerebral volume loss. Cerebral ventricle size is are stable and concordant with the degree of cerebral volume loss. Atherosclerotic intracranial arteries. Nonspecific subcortical and periventricular white matter hypodensity, most in keeping with chronic small vessel ischemic change.  The visualized paranasal sinuses are essentially clear. The mastoid air cells are unopacified. No evidence of calvarial fracture.  IMPRESSION: 1. No acute intracranial abnormality. 2. Generalized cerebral volume loss and chronic small vessel ischemic change.   Electronically  Signed   By: Ilona Sorrel M.D.   On: 06/25/2015 17:52   Ct Chest W Contrast  06/25/2015   CLINICAL DATA:  Abnormal chest x-ray  EXAM: CT CHEST WITH CONTRAST  TECHNIQUE: Multidetector CT imaging of the chest was performed during intravenous contrast administration.  CONTRAST:  66m OMNIPAQUE IOHEXOL 300 MG/ML  SOLN  COMPARISON:  Plain film from earlier in the same day  FINDINGS: The left lung is well aerated without focal infiltrate or sizable effusion. The right lung is also well aerated but demonstrates patchy changes in the posterior aspect of the right upper lobe. Some of these the appearance of early infiltrate  although a more organized 15 mm mildly spiculated lesion is noted in the upper lobe best seen on image number 19 of series 3. No other focal lesions are noted.  The thoracic inlet is within normal limits. Calcifications are noted in the thoracic aorta and its branches although no aneurysmal dilatation or dissection is seen. Heavy coronary calcifications are noted particularly in the left anterior descending coronary artery. The pulmonary artery demonstrates a normal branching pattern without definitive filling defect. No significant lymphadenopathy is seen.  Scanning into the upper abdomen reveals no acute abnormality. A left renal cyst is noted. The osseous structures are within normal limits.  IMPRESSION: Somewhat spiculated mass lesion in the right upper lobe with some associated right upper lobe infiltrative changes. This must be viewed with a degree of suspicion for underlying neoplasm. Continued workup is recommended. This lesion would be amenable to percutaneous biopsy if clinically indicated.   Electronically Signed   By: MInez CatalinaM.D.   On: 06/25/2015 17:51      Indwelling Urinary Catheter continued, requirement due to   Reason to continue Indwelling Urinary Catheter for strict Intake/Output monitoring for hemodynamic instability   Central Line continued, requirement due to   Reason to continue CKinder Morgan EnergyMonitoring of central venous pressure or other hemodynamic parameters   Ventilator continued, requirement due to, resp failure    Ventilator Sedation RASS 0 to -2      ASSESSMENT/PLAN    PULMONARY ETT - 8.0 A: Respiratory failure Possible RUL PNA RUL nodule P:   - cont with MV, wean as tolerated - abg PRN - RUL spiculated lesion, concerned for malignancy - can be worked up after this acute episode is over - RUL lesion could be driving hyponatremia, SIADH like picture. - lesion will require biopsy in the future.  - empiric antibiotics.   CARDIOVASCULAR CVL -  LIG Hypotension P:  - cont with IVFs and pressors - trend CE - cont with hemodynamic monitoring.   RENAL A:  Hyponatremia  P:   - could be the result RUL lesion or RUL PNA - cont with gentle IVFs - renal following - cont with Na montioring - Q6 hrs  GASTROINTESTINAL Hx of IBS - cont with ppi   HEMATOLOGIC A:  RUL spiculated lung lesion - 1.5cm P:  - workup in the future, with biopsy (possibly bronch, ENB)  INFECTIOUS A:  ? PNA P:   BCx2 9/29 Trach Asp 9/30>> Abx: vanc/zosyn 9/30 Azithro 9/29>>9/30    NEUROLOGIC A:   Encephalopathy  CO2 narcosis P:   - cont with MV - monitor lytes - monitor ABG RASS goal: -1   I have personally obtained a history, examined the patient, evaluated laboratory and imaging results, formulated the assessment and plan and placed orders.  The Patient requires high complexity decision making for assessment and  support, frequent evaluation and titration of therapies, application of advanced monitoring technologies and extensive interpretation of multiple databases. Critical Care Time devoted to patient care services described in this note is 60 minutes.   Overall, patient is critically ill, prognosis is guarded. Patient at high risk for cardiac arrest and death.   Vilinda Boehringer, MD [facility] Pager [phone] (please enter 7-digits) On Call Pager - [phone] (please enter 7-digits)     06/26/2015, 12:03 PM

## 2015-06-26 NOTE — Procedures (Signed)
  Procedure Note: Central Venous Catheter Placement - [place] , [id number] , IC18A/IC18A-AA  Indications: Hemodynamic monitoring / Intravenous access  Benefits, risks (including bleeding, infection,  Injury, etc.), and alternatives explained to wife [facility]) who voiced understanding.   A time-out was completed verifying correct patient, procedure and site. A 3 lumen catheter available at the time of procedure.  The patient was placed in a dependent position appropriate for central line placement based on the vein to be cannulated.  The patient's LEFT Internal Jugular Vein was prepped and draped in a sterile fashion. 1% Lidocaine WAS NOT used to anesthetize the surrounding skin area.  A 3 lumen catheter was introduced into the LEFT Internal Jugular Vein using Seldinger technique, visualized under ultrasound.  The catheter was threaded smoothly over the guide wire and appropriate blood return was obtained.  Each lumen of the catheter was evacuated of air and flushed with sterile saline.  The catheter was then sutured in place to the skin and a sterile dressing applied.  Perfusion to the extremity distal to the point of catheter insertion was checked and found to be adequate.    Chest X-ray was ordered for confirmation of placement.  The patient tolerated the procedure well and there were no complications.  Vilinda Boehringer, MD [facility] Pager [phone] (please enter 7-digits) On Call Pager - [phone] (please enter 7-digits)

## 2015-06-26 NOTE — Progress Notes (Signed)
Pharmacy Consult for Vancomycin and Zosyn Indication: Sepsis  Allergies  Allergen Reactions  . Fexofenadine Other (See Comments)  . Quinapril Hcl Other (See Comments)    Unknown   . Latex Rash    Patient Measurements: Height: '5\' 7"'$  (170.2 cm) Weight: 145 lb 9.6 oz (66.044 kg) IBW/kg (Calculated) : 66.1  Vital Signs: Temp: 94.8 F (34.9 C) (09/30 1017) Temp Source: Rectal (09/30 1000) BP: 85/50 mmHg (09/30 1030) Pulse Rate: 97 (09/30 1017) Intake/Output from previous day: 09/29 0701 - 09/30 0700 In: -  Out: 100 [Urine:100] Intake/Output from this shift:    Labs:  Recent Labs  06/24/15 0944 06/25/15 1426 06/26/15 0245  WBC  --  10.2 12.1*  HGB  --  13.7 13.5  PLT  --  284 271  CREATININE 0.78 0.85 0.73   Estimated Creatinine Clearance: 66.5 mL/min (by C-G formula based on Cr of 0.73). No results for input(s): VANCOTROUGH, VANCOPEAK, VANCORANDOM, GENTTROUGH, GENTPEAK, GENTRANDOM, TOBRATROUGH, TOBRAPEAK, TOBRARND, AMIKACINPEAK, AMIKACINTROU, AMIKACIN in the last 72 hours.   Microbiology: No results found for this or any previous visit (from the past 720 hour(s)).  Medical History: Past Medical History  Diagnosis Date  . COPD with asthma   . Diabetes mellitus     Diet control   . Hyperlipidemia   . Carpal tunnel syndrome   . DJD (degenerative joint disease), cervical   . Vitamin D deficiency   . Tremor, essential   . Internal hemorrhoids   . Personal history of colonic polyps     adenomatous  . IBS (irritable bowel syndrome)   . Peptic ulcer   . Duodenal ulcer, with partial obstruction 08/10/2012  . Numbness and tingling in right hand     started 2 yeas ago  . Hypertension     no medicine needed  . Memory deficit 12/16/2013  . Essential and other specified forms of tremor 12/16/2013  . Polyneuropathy in diabetes(357.2)     Medications:  Scheduled:  . antiseptic oral rinse  7 mL Mouth Rinse QID  . chlorhexidine gluconate  15 mL Mouth Rinse BID  .  enoxaparin (LOVENOX) injection  40 mg Subcutaneous Q24H  . fentaNYL (SUBLIMAZE) injection  50 mcg Intravenous Once  . ipratropium-albuterol  3 mL Nebulization Q6H  . levothyroxine  50 mcg Oral QAC breakfast  . loratadine  10 mg Oral Daily  . methylPREDNISolone (SOLU-MEDROL) injection  125 mg Intravenous STAT  . methylPREDNISolone (SOLU-MEDROL) injection  60 mg Intravenous Q6H  . pantoprazole  40 mg Oral Daily  . piperacillin-tazobactam (ZOSYN)  IV  3.375 g Intravenous 3 times per day  . pneumococcal 23 valent vaccine  0.5 mL Intramuscular Tomorrow-1000  . sodium chloride  3 mL Intravenous Q12H  . vancomycin  750 mg Intravenous STAT  . vancomycin  750 mg Intravenous Q12H   Infusions:  . sodium chloride    . fentaNYL infusion INTRAVENOUS 50 mcg/hr (06/26/15 1104)   PRN: acetaminophen **OR** acetaminophen, fentaNYL, ipratropium, midazolam, midazolam, ondansetron **OR** ondansetron (ZOFRAN) IV  Assessment: 81 y/o M with acute respiratory failure recently intubated ordered empiric abx for sepsis.   Kinetics:   Ke: 0.06 Vd: 46.2   Goal of Therapy:  Vancomycin trough level 15-20 mcg/ml  Plan:  1. Vancomycin 750 mg iv q 12 hours with stacked dosing and a trough with the 5th dose.   2. Zosyn 3.375 g EI q 8 hours.   Will f/u renal function and culture results.   Ulice Dash D 06/26/2015,11:04 AM

## 2015-06-26 NOTE — Progress Notes (Signed)
   06/26/15 1130  Clinical Encounter Type  Visited With Patient;Family;Patient and family together;Health care provider  Visit Type Initial;Spiritual support  Referral From Nurse  Consult/Referral To Chaplain  Spiritual Encounters  Spiritual Needs Emotional;Other (Comment)  Stress Factors  Patient Stress Factors None identified  Family Stress Factors Health changes  Chaplain rounded in the unit and offered a compassioante presence to staff, patient and family as patient experience health changes. Visited with patient to offer a compassionate presence and nurse gave me update and asked me to support family into the patient's room for update. Assisted family and offered spiritual support as applicable. Chaplain Sonya A. Laws Ext. [phone]  *Disregard previous note'@11am'$  as it was done in error.

## 2015-06-26 NOTE — Progress Notes (Deleted)
   06/26/15 1100  Clinical Encounter Type  Visited With Patient and family together  Visit Type Initial  Consult/Referral To Chaplain  Spiritual Encounters  Spiritual Needs Emotional  Stress Factors  Patient Stress Factors None identified  Family Stress Factors None identified  Chaplain rounded in the unit and offered a compassionate presence and support to the patient and family. Patient and family appeared optimistic as there was discussion about his discharge. Asked to be kept in prayer and I offered a blessing. Patient received it and was pleasant. Chaplain Sonya A. Laws Ext. [phone]

## 2015-06-27 LAB — CBC
HCT: 37.5 % — ABNORMAL LOW (ref 40.0–52.0)
Hemoglobin: 12.4 g/dL — ABNORMAL LOW (ref 13.0–18.0)
MCH: 31 pg (ref 26.0–34.0)
MCHC: 33.1 g/dL (ref 32.0–36.0)
MCV: 93.5 fL (ref 80.0–100.0)
PLATELETS: 275 10*3/uL (ref 150–440)
RBC: 4.01 MIL/uL — AB (ref 4.40–5.90)
RDW: 13 % (ref 11.5–14.5)
WBC: 10.8 10*3/uL — AB (ref 3.8–10.6)

## 2015-06-27 LAB — SODIUM
SODIUM: 124 mmol/L — AB (ref 135–145)
SODIUM: 126 mmol/L — AB (ref 135–145)
Sodium: 129 mmol/L — ABNORMAL LOW (ref 135–145)

## 2015-06-27 LAB — BASIC METABOLIC PANEL
ANION GAP: 7 (ref 5–15)
BUN: 24 mg/dL — AB (ref 6–20)
CALCIUM: 8.1 mg/dL — AB (ref 8.9–10.3)
CO2: 33 mmol/L — ABNORMAL HIGH (ref 22–32)
Chloride: 84 mmol/L — ABNORMAL LOW (ref 101–111)
Creatinine, Ser: 1.32 mg/dL — ABNORMAL HIGH (ref 0.61–1.24)
GFR calc Af Amer: 56 mL/min — ABNORMAL LOW (ref >60)
GFR, EST NON AFRICAN AMERICAN: 49 mL/min — AB (ref >60)
Glucose, Bld: 257 mg/dL — ABNORMAL HIGH (ref 65–99)
POTASSIUM: 3.7 mmol/L (ref 3.5–5.1)
SODIUM: 124 mmol/L — AB (ref 135–145)

## 2015-06-27 LAB — GLUCOSE, CAPILLARY
Glucose-Capillary: 265 mg/dL — ABNORMAL HIGH (ref 65–99)
Glucose-Capillary: 279 mg/dL — ABNORMAL HIGH (ref 65–99)
Glucose-Capillary: 192 mg/dL — ABNORMAL HIGH (ref 65–99)

## 2015-06-27 LAB — HEMOGLOBIN A1C: Hgb A1c MFr Bld: 6.1 % — ABNORMAL HIGH (ref 4.0–6.0)

## 2015-06-27 LAB — MAGNESIUM: MAGNESIUM: 1.3 mg/dL — AB (ref 1.7–2.4)

## 2015-06-27 LAB — PHOSPHORUS: Phosphorus: 2.3 mg/dL — ABNORMAL LOW (ref 2.5–4.6)

## 2015-06-27 MED ORDER — LORATADINE 10 MG PO TABS
10.0000 mg | ORAL_TABLET | Freq: Every day | ORAL | Status: DC | PRN
  Administered 2015-06-30: 10 mg via ORAL
  Filled 2015-06-27: qty 1

## 2015-06-27 MED ORDER — QUETIAPINE FUMARATE 25 MG PO TABS
25.0000 mg | ORAL_TABLET | Freq: Two times a day (BID) | ORAL | Status: DC
  Administered 2015-06-27: 25 mg via ORAL
  Filled 2015-06-27: qty 1

## 2015-06-27 MED ORDER — FAMOTIDINE 40 MG/5ML PO SUSR
20.0000 mg | Freq: Every day | ORAL | Status: DC
  Administered 2015-06-27 – 2015-07-01 (×5): 20 mg via ORAL
  Filled 2015-06-27 (×8): qty 2.5

## 2015-06-27 MED ORDER — DEXMEDETOMIDINE HCL IN NACL 400 MCG/100ML IV SOLN
0.4000 ug/kg/h | INTRAVENOUS | Status: DC
  Administered 2015-06-27: 0.4 ug/kg/h via INTRAVENOUS
  Administered 2015-06-27: 0.7 ug/kg/h via INTRAVENOUS
  Administered 2015-06-28: 1 ug/kg/h via INTRAVENOUS
  Administered 2015-06-28: 1.5 ug/kg/h via INTRAVENOUS
  Administered 2015-06-28: 0.7 ug/kg/h via INTRAVENOUS
  Administered 2015-06-29: 1.5 ug/kg/h via INTRAVENOUS
  Administered 2015-06-29: 0.5 ug/kg/h via INTRAVENOUS
  Administered 2015-06-29: 1.5 ug/kg/h via INTRAVENOUS
  Administered 2015-06-29: 0.8 ug/kg/h via INTRAVENOUS
  Administered 2015-06-30: 1.2 ug/kg/h via INTRAVENOUS
  Administered 2015-06-30: 1 ug/kg/h via INTRAVENOUS
  Administered 2015-06-30: 1.5 ug/kg/h via INTRAVENOUS
  Administered 2015-06-30: 1 ug/kg/h via INTRAVENOUS
  Administered 2015-06-30: 1.2 ug/kg/h via INTRAVENOUS
  Administered 2015-07-01 (×2): 1.5 ug/kg/h via INTRAVENOUS
  Filled 2015-06-27 (×6): qty 100
  Filled 2015-06-27: qty 50
  Filled 2015-06-27 (×10): qty 100

## 2015-06-27 MED ORDER — HALOPERIDOL LACTATE 5 MG/ML IJ SOLN
2.5000 mg | Freq: Once | INTRAMUSCULAR | Status: AC
  Administered 2015-06-27: 2.5 mg via INTRAVENOUS

## 2015-06-27 MED ORDER — HALOPERIDOL LACTATE 5 MG/ML IJ SOLN
INTRAMUSCULAR | Status: AC
  Administered 2015-06-27: 2.5 mg via INTRAVENOUS
  Filled 2015-06-27: qty 1

## 2015-06-27 MED ORDER — PREDNISONE 10 MG PO TABS
30.0000 mg | ORAL_TABLET | Freq: Every day | ORAL | Status: DC
  Administered 2015-06-28 – 2015-06-30 (×3): 30 mg via ORAL
  Filled 2015-06-27 (×3): qty 3

## 2015-06-27 MED ORDER — PANTOPRAZOLE SODIUM 40 MG IV SOLR
40.0000 mg | INTRAVENOUS | Status: DC
  Administered 2015-06-27: 40 mg via INTRAVENOUS
  Filled 2015-06-27: qty 40

## 2015-06-27 MED ORDER — QUETIAPINE FUMARATE 25 MG PO TABS: 25.0000 mg | ORAL_TABLET | Freq: Two times a day (BID) | ORAL | Status: DC | PRN

## 2015-06-27 MED ORDER — DEXTROSE 5 % IV SOLN
500.0000 mg | INTRAVENOUS | Status: AC
  Administered 2015-06-27 – 2015-07-01 (×5): 500 mg via INTRAVENOUS
  Filled 2015-06-27 (×5): qty 500

## 2015-06-27 MED ORDER — SODIUM CHLORIDE 3 % IV SOLN
INTRAVENOUS | Status: DC
  Administered 2015-06-27: 30 mL/h via INTRAVENOUS
  Filled 2015-06-27 (×5): qty 500

## 2015-06-27 NOTE — Consult Note (Addendum)
PULMONARY / CRITICAL CARE MEDICINE   Name: Aaron Hendrix MRN: [id number] DOB: 19-Dec-1929    ADMISSION DATE:  06/25/2015 The patient was admitted on September 29 with acute unresponsiveness. He was subsequently intubated. 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 This is thought to be secondary to a possible lung cancer as the patient has a lung nodule. The patient was admitted with unresponsiveness  ASSESSMENT/PLAN     PULMONARY ETT - 8.0 A: Respiratory failure Possible RUL PNA RUL nodule. Hypercapnic respiratory failure, with hypoventilation. I will decrease the patient's tidal volume. Today, as he appears to have been over ventilated. Possible acute bronchitis, doubt pneumonia. P:   - cont with MV, wean as tolerated - abg PRN - RUL spiculated lesion, concerned for malignancy - can be worked up after this acute episode is over - RUL lesion could be driving hyponatremia, SIADH like picture. - lesion will require biopsy in the future.  - We will decrease antibiotic coverage, await further cultures. The patient's steroid dose was decreased.  CARDIOVASCULAR CVL - LIG Hypotension P:  - Improved   RENAL A:  Hyponatremia  P:   - could be the result RUL lesion or RUL PNA - Free water restriction. -nephrology following.   GASTROINTESTINAL Hx of IBS PPI was changed to H2 blocker today.  HEMATOLOGIC A:  RUL spiculated lung lesion - 1.5cm P:  - workup in the future, with biopsy (possibly ENB bronchoscopy when the patient is stable.)  INFECTIOUS A:  Suspect acute bronchitis. P:   BCx2 9/29 Trach Asp 9/30>> Abx: vanc/zosyn 9/30 Azithro 9/29>>9/30    NEUROLOGIC A:   Agitation, delirium, severe tremor. Encephalopathy  CO2 narcosis P:   We'll start the patient on sedation for delirium.   CHIEF COMPLAINT:     unresponsive   HISTORY OF PRESENT ILLNESS  Patient was very awake and agitated this morning, his blood pressure  was very elevated, which I suspect is from his severe tremor which was noted at that time. Patient was very agitated this morning  SIGNIFICANT EVENTS  9/29>>admitted for low Na 9/30>>unresponsive, elevated co2, respiratory failure>>intubated   PAST MEDICAL HISTORY    :  Past Medical History  Diagnosis Date  . COPD with asthma   . Diabetes mellitus     Diet control   . Hyperlipidemia   . Carpal tunnel syndrome   . DJD (degenerative joint disease), cervical   . Vitamin D deficiency   . Tremor, essential   . Internal hemorrhoids   . Personal history of colonic polyps     adenomatous  . IBS (irritable bowel syndrome)   . Peptic ulcer   . Duodenal ulcer, with partial obstruction 08/10/2012  . Numbness and tingling in right hand     started 2 yeas ago  . Hypertension     no medicine needed  . Memory deficit 12/16/2013  . Essential and other specified forms of tremor 12/16/2013  . Polyneuropathy in diabetes(357.2)    Past Surgical History  Procedure Laterality Date  . Anal fissure repair    . Colonoscopy w/ biopsies and polypectomy  8/03, 6/05, 7/09, 9/10    internal hemorrhoids, tubular adenomas, mucosa & lymphoid nodules  . Upper gastrointestinal endoscopy  3/05, 7/09, 9/10,2013    gastritis, duodenitis  . Total hip arthroplasty Right 11/13/2012    Procedure: TOTAL HIP ARTHROPLASTY ANTERIOR APPROACH;  Surgeon: Mcarthur Rossetti, MD;  Location: [facility];  Service: Orthopedics;  Laterality: Right;  Right total hip arthroplasty  . Ankle surgery Right     right- pins placed in   Prior to Admission medications   Medication Sig Start Date End Date Taking? Authorizing Provider  acetaminophen (TYLENOL) 500 MG tablet Take 500 mg by mouth every 6 (six) hours as needed.   Yes Historical Provider, MD  Docusate Calcium (STOOL SOFTENER PO) Take by mouth.   Yes Historical Provider, MD  ipratropium (ATROVENT) 0.06 % nasal spray Place 2 sprays into the nose daily as needed.    Yes  Historical Provider, MD  levothyroxine (SYNTHROID, LEVOTHROID) 50 MCG tablet Take 50 mcg by mouth daily before breakfast.   Yes Reynold Bowen, MD  loratadine (CLARITIN) 10 MG tablet Take 10 mg by mouth daily.   Yes Historical Provider, MD  pantoprazole (PROTONIX) 40 MG tablet TAKE 1 TABLET BY MOUTH ONCE DAILY, 30-60MINUTES BEFORE A MEAL. 06/02/15  Yes Gatha Mayer, MD  traMADol (ULTRAM) 50 MG tablet Take by mouth at bedtime as needed.   Yes Historical Provider, MD  Vitamin D, Ergocalciferol, (DRISDOL) 50000 UNITS CAPS capsule Take 50,000 Units by mouth every 7 (seven) days. On Friday   Yes Historical Provider, MD  saccharomyces boulardii (FLORASTOR) 250 MG capsule Take 250 mg by mouth daily.    Historical Provider, MD   Allergies  Allergen Reactions  . Fexofenadine Other (See Comments)  . Quinapril Hcl Other (See Comments)    Unknown   . Latex Rash     FAMILY HISTORY   Family History  Problem Relation Age of Onset  . Colon cancer Neg Hx   . Esophageal cancer Neg Hx   . Rectal cancer Neg Hx   . Stomach cancer Neg Hx   . Leukemia Maternal Aunt   . Thyroid cancer Daughter 33  . Diabetes Son       SOCIAL HISTORY    reports that he quit smoking about 52 years ago. His smoking use included Cigarettes. He has never used smokeless tobacco. He reports that he drinks alcohol. He reports that he does not use illicit drugs.  Review of Systems  Unable to perform ROS: critical illness      VITAL SIGNS    Temp:  [94.6 F (34.8 C)-101.3 F (38.5 C)] 99.9 F (37.7 C) (10/01 0815) Pulse Rate:  [60-128] 127 (10/01 0815) Resp:  [13-31] 21 (10/01 0815) BP: (79-204)/(44-184) 202/114 mmHg (10/01 0815) SpO2:  [89 %-100 %] 96 % (10/01 0815) FiO2 (%):  [30 %-40 %] 30 % (10/01 0700) Weight:  [68.5 kg (151 lb 0.2 oz)] 68.5 kg (151 lb 0.2 oz) (10/01 0500) HEMODYNAMICS: CVP:  [5 mmHg-8 mmHg] 5 mmHg VENTILATOR SETTINGS: Vent Mode:  [-] PSV FiO2 (%):  [30 %-40 %] 30 % Set Rate:  [18  bmp-24 bmp] 18 bmp Vt Set:  [430 mL-500 mL] 430 mL PEEP:  [5 cmH20] 5 cmH20 Pressure Support:  [5 cmH20] 5 cmH20 INTAKE / OUTPUT:  Intake/Output Summary (Last 24 hours) at 06/27/15 0947 Last data filed at 06/27/15 0700  Gross per 24 hour  Intake   2585 ml  Output    822 ml  Net   1763 ml       PHYSICAL EXAM   Physical Exam  Constitutional:  Mildly thin appearing male, in moderate respiratory distress, unresponsive to verbal command, withdrawals from pain only.  Eyes: EOM are normal. Pupils are equal, round, and reactive to light. Right eye exhibits no discharge.  Neck: Normal range of motion.  Cardiovascular: Regular  rhythm, normal heart sounds and intact distal pulses.   Pulmonary/Chest: He has no wheezes. He has no rales.  Respiratory failure.  Dec bs bilaterally, no wheezes, no rales.   Abdominal: Soft. Bowel sounds are normal.  Musculoskeletal: He exhibits no edema.  Neurological:  Unresponsive.   Skin: Skin is warm and dry.  Nursing note and vitals reviewed.      LABS   LABS:  CBC  Recent Labs Lab 06/25/15 1426 06/26/15 0245 06/27/15 0512  WBC 10.2 12.1* 10.8*  HGB 13.7 13.5 12.4*  HCT 42.1 41.6 37.5*  PLT 284 271 275   Coag's No results for input(s): APTT, INR in the last 168 hours. BMET  Recent Labs Lab 06/25/15 1426  06/26/15 0245 06/26/15 1020 06/27/15 0512  NA 118*  < > 124* 122* 124*  K 4.2  --  4.3  --  3.7  CL 77*  --  81*  --  84*  CO2 31  --  35*  --  33*  BUN 12  --  11  --  24*  CREATININE 0.85  --  0.73  --  1.32*  GLUCOSE 237*  --  133*  --  257*  < > = values in this interval not displayed. Electrolytes  Recent Labs Lab 06/25/15 1426 06/26/15 0245 06/27/15 0512  CALCIUM 8.3* 8.3* 8.1*  MG  --   --  1.3*  PHOS  --   --  2.3*   Sepsis Markers No results for input(s): LATICACIDVEN, PROCALCITON, O2SATVEN in the last 168 hours. ABG  Recent Labs Lab 06/26/15 0850 06/26/15 1150 06/27/15 0845  PHART 6.99* 7.60*  7.23*  PCO2ART 162* 29* 82*  PO2ART 245* 99 92   Liver Enzymes  Recent Labs Lab 06/24/15 0944  AST 23  ALT 12  ALKPHOS 75  BILITOT 0.6  ALBUMIN 4.0   Cardiac Enzymes  Recent Labs Lab 06/25/15 1426 06/26/15 1831  TROPONINI <0.03 0.06*   Glucose  Recent Labs Lab 06/26/15 1509 06/26/15 2344 06/27/15 0550  GLUCAP 172* 244* 265*     Recent Results (from the past 240 hour(s))  Blood culture (routine x 2)     Status: None (Preliminary result)   Collection Time: 06/25/15  5:19 PM  Result Value Ref Range Status   Specimen Description BLOOD RIGHT ARM  Final   Special Requests BAA,AER5ML,ANA5ML  Final   Culture NO GROWTH 2 DAYS  Final   Report Status PENDING  Incomplete  Blood culture (routine x 2)     Status: None (Preliminary result)   Collection Time: 06/25/15  5:57 PM  Result Value Ref Range Status   Specimen Description BLOOD RIGHT HAND  Final   Special Requests BAA, ANA5ML,AER5ML  Final   Culture NO GROWTH 2 DAYS  Final   Report Status PENDING  Incomplete  MRSA PCR Screening     Status: None   Collection Time: 06/26/15  9:50 AM  Result Value Ref Range Status   MRSA by PCR NEGATIVE NEGATIVE Final    Comment:        The GeneXpert MRSA Assay (FDA approved for NASAL specimens only), is one component of a comprehensive MRSA colonization surveillance program. It is not intended to diagnose MRSA infection nor to guide or monitor treatment for MRSA infections.   Culture, respiratory (NON-Expectorated)     Status: None (Preliminary result)   Collection Time: 06/26/15 12:26 PM  Result Value Ref Range Status   Specimen Description TRACHEAL ASPIRATE  Final  Special Requests Normal  Final   Gram Stain PENDING  Incomplete   Culture HOLDING FOR POSSIBLE PATHOGEN  Final   Report Status PENDING  Incomplete      IMAGING    Dg Chest 1 View  06/26/2015   CLINICAL DATA:  Central line placement, intubation  EXAM: CHEST 1 VIEW  COMPARISON:  06/25/2015 chest  radiograph and CT  FINDINGS: Endotracheal tube is appropriately positioned. Left IJ central line tip terminates over the mid SVC. Heart size normal. Lungs are grossly clear. Right upper lobe nodule is not as well seen as on the prior exam, possibly obscured by support apparatus. Nasogastric tube tip terminates below the level of the diaphragms but is not included in the field of view.  IMPRESSION: No new acute finding.  Support apparatus as above.   Electronically Signed   By: Conchita Paris M.D.   On: 06/26/2015 11:23   Dg Abd 1 View  06/26/2015   CLINICAL DATA:  Evaluate OG tube placement.  EXAM: ABDOMEN - 1 VIEW  COMPARISON:  06/04/2014  FINDINGS: The OG tube tip is in the stomach. Side port is below the GE junction. No dilated loops of bowel identified. Gas and stool noted within the colon up to the rectum.  IMPRESSION: 1. OG tube tip is in the stomach. 2. Nonobstructive bowel gas pattern.   Electronically Signed   By: Kerby Moors M.D.   On: 06/26/2015 11:49      Indwelling Urinary Catheter continued, requirement due to   Reason to continue Indwelling Urinary Catheter for strict Intake/Output monitoring for hemodynamic instability   Central Line continued, requirement due to   Reason to continue [employer] Monitoring of central venous pressure or other hemodynamic parameters   Ventilator continued, requirement due to, resp failure    Ventilator Sedation RASS 0 to -2      Deep Ashby Dawes, M.D.  [facility] On Call Pager [phone] (please enter 7-digits)   Critical Care Attestation.  I have personally obtained a history, examined the patient, evaluated laboratory and imaging results, formulated the assessment and plan and placed orders. The Patient requires high complexity decision making for assessment and support, frequent evaluation and titration of therapies, application of advanced monitoring technologies and extensive interpretation of  multiple databases. The patient has critical illness that could lead imminently to failure of 1 or more organ systems and requires the highest level of physician preparedness to intervene.  Critical Care Time devoted to patient care services described in this note is 35 minutes and is exclusive of time spent in procedures.     06/27/2015, 9:47 AM

## 2015-06-27 NOTE — Progress Notes (Signed)
eLink Physician-Brief Progress Note Patient Name: Aaron Hendrix DOB: 1930/01/22 MRN: [id number]   Date of Service  06/27/2015  HPI/Events of Note  Patient with suspected SIADH with serum Na of 122.  Review of MAR shows scheduled free water via OGT.    eICU Interventions  Plan: D/C free water Hold on additional fluid boluses (CVP is 8 and 5 but HD stable with BP of 118/69 (83) off pressors KVO IVFs     Intervention Category Intermediate Interventions: Electrolyte abnormality - evaluation and management  [name] 06/27/2015, 1:48 AM

## 2015-06-27 NOTE — Progress Notes (Signed)
[facility] [place] NAME: Aaron Hendrix    MR#:  [id number]  DATE OF BIRTH:  1930-01-11  SUBJECTIVE:   pt intubated and on the vent day 1  REVIEW OF SYSTEMS:  Review of systems unobtainable DRUG ALLERGIES:   Allergies  Allergen Reactions  . Fexofenadine Other (See Comments)  . Quinapril Hcl Other (See Comments)    Unknown   . Latex Rash    VITALS:  Blood pressure 93/53, pulse 100, temperature 99.7 F (37.6 C), temperature source Rectal, resp. rate 18, height '5\' 7"'$  (1.702 m), weight 68.5 kg (151 lb 0.2 oz), SpO2 99 %.  PHYSICAL EXAMINATION:  GENERAL:  81 y.o.-year-old patient lying in the bed with no acute distress. Critically ill and on the vent EYES: Pupils equal, round, reactive to light and accommodation. No scleral icterus.  HEENT: Head atraumatic, normocephalic.  NECK:  Supple, no jugular venous distention. No thyroid enlargement, no tenderness.  LUNGS: Shallow respirations with a decreased breath sounds bilaterally, minimal wheezing, no rales,rhonchi or crepitation. No use of accessory muscles of respiration.  CARDIOVASCULAR: S1, S2 normal. No murmurs, rubs, or gallops.tachycardia+ ABDOMEN: Soft, nontender, nondistended. Bowel sounds present. No organomegaly or mass.  EXTREMITIES: No pedal edema, cyanosis, or clubbing.  NEUROLOGIC: on the vent PSYCHIATRIC: on the vent SKIN: No obvious rash, lesion, or ulcer.   LABORATORY PANEL:   CBC  Recent Labs Lab 06/27/15 0512  WBC 10.8*  HGB 12.4*  HCT 37.5*  PLT 275   ------------------------------------------------------------------------------------------------------------------  Chemistries   Recent Labs Lab 06/24/15 0944  06/27/15 0512  NA 123*  < > 124*  K 4.3  < > 3.7  CL 79*  < > 84*  CO2 38*  < > 33*  GLUCOSE 118*  < > 257*  BUN 11  < > 24*  CREATININE 0.78  < > 1.32*  CALCIUM 9.3  < > 8.1*  MG  --   --  1.3*  AST 23  --   --   ALT 12  --   --    ALKPHOS 75  --   --   BILITOT 0.6  --   --   < > = values in this interval not displayed. ------------------------------------------------------------------------------------------------------------------  Cardiac Enzymes  Recent Labs Lab 06/26/15 1831  TROPONINI 0.06*   ------------------------------------------------------------------------------------------------------------------  RADIOLOGY:  Dg Chest 1 View  06/26/2015   CLINICAL DATA:  Central line placement, intubation  EXAM: CHEST 1 VIEW  COMPARISON:  06/25/2015 chest radiograph and CT  FINDINGS: Endotracheal tube is appropriately positioned. Left IJ central line tip terminates over the mid SVC. Heart size normal. Lungs are grossly clear. Right upper lobe nodule is not as well seen as on the prior exam, possibly obscured by support apparatus. Nasogastric tube tip terminates below the level of the diaphragms but is not included in the field of view.  IMPRESSION: No new acute finding.  Support apparatus as above.   Electronically Signed   By: Conchita Paris M.D.   On: 06/26/2015 11:23   Dg Chest 2 View  06/25/2015   CLINICAL DATA:  Hyponatremia.  Weakness.  EXAM: CHEST  2 VIEW  COMPARISON:  06/01/2014 and 04/25/2012  FINDINGS: There is a vague nodular appearing density in the right upper lobe between the anterior aspects of the right second and third ribs. There is adjacent oxygen 2 being. This could that be artifactual. Lungs are otherwise clear. Heart size and vascularity are normal. Interposed bowel under  the right hemidiaphragm, unchanged. No acute osseous abnormality.  IMPRESSION: Vague density in the right upper lobe. This may be artifactual. I recommend repeat AP or PA chest x-ray prior to discharge.   Electronically Signed   By: Lorriane Shire M.D.   On: 06/25/2015 16:27   Dg Abd 1 View  06/26/2015   CLINICAL DATA:  Evaluate OG tube placement.  EXAM: ABDOMEN - 1 VIEW  COMPARISON:  06/04/2014  FINDINGS: The OG tube tip is in  the stomach. Side port is below the GE junction. No dilated loops of bowel identified. Gas and stool noted within the colon up to the rectum.  IMPRESSION: 1. OG tube tip is in the stomach. 2. Nonobstructive bowel gas pattern.   Electronically Signed   By: Kerby Moors M.D.   On: 06/26/2015 11:49   Ct Head Wo Contrast  06/25/2015   CLINICAL DATA:  Altered mental status.  Weakness.  Hyponatremia.  EXAM: CT HEAD WITHOUT CONTRAST  TECHNIQUE: Contiguous axial images were obtained from the base of the skull through the vertex without intravenous contrast.  COMPARISON:  12/02/2008 head CT.  12/21/2013 brain MRI.  FINDINGS: No evidence of parenchymal hemorrhage or extra-axial fluid collection. No mass lesion, mass effect, or midline shift. No CT evidence of acute infarction. Stable generalized cerebral volume loss. Cerebral ventricle size is are stable and concordant with the degree of cerebral volume loss. Atherosclerotic intracranial arteries. Nonspecific subcortical and periventricular white matter hypodensity, most in keeping with chronic small vessel ischemic change.  The visualized paranasal sinuses are essentially clear. The mastoid air cells are unopacified. No evidence of calvarial fracture.  IMPRESSION: 1. No acute intracranial abnormality. 2. Generalized cerebral volume loss and chronic small vessel ischemic change.   Electronically Signed   By: Ilona Sorrel M.D.   On: 06/25/2015 17:52   Ct Chest W Contrast  06/25/2015   CLINICAL DATA:  Abnormal chest x-ray  EXAM: CT CHEST WITH CONTRAST  TECHNIQUE: Multidetector CT imaging of the chest was performed during intravenous contrast administration.  CONTRAST:  28m OMNIPAQUE IOHEXOL 300 MG/ML  SOLN  COMPARISON:  Plain film from earlier in the same day  FINDINGS: The left lung is well aerated without focal infiltrate or sizable effusion. The right lung is also well aerated but demonstrates patchy changes in the posterior aspect of the right upper lobe. Some of  these the appearance of early infiltrate although a more organized 15 mm mildly spiculated lesion is noted in the upper lobe best seen on image number 19 of series 3. No other focal lesions are noted.  The thoracic inlet is within normal limits. Calcifications are noted in the thoracic aorta and its branches although no aneurysmal dilatation or dissection is seen. Heavy coronary calcifications are noted particularly in the left anterior descending coronary artery. The pulmonary artery demonstrates a normal branching pattern without definitive filling defect. No significant lymphadenopathy is seen.  Scanning into the upper abdomen reveals no acute abnormality. A left renal cyst is noted. The osseous structures are within normal limits.  IMPRESSION: Somewhat spiculated mass lesion in the right upper lobe with some associated right upper lobe infiltrative changes. This must be viewed with a degree of suspicion for underlying neoplasm. Continued workup is recommended. This lesion would be amenable to percutaneous biopsy if clinically indicated.   Electronically Signed   By: MInez CatalinaM.D.   On: 06/25/2015 17:51    EKG:   Orders placed or performed during the hospital encounter of 06/25/15  .  EKG 12-Lead  . EKG 12-Lead  . ED EKG  . ED EKG    ASSESSMENT AND PLAN:   Aaron Hendrix is a 81 y.o. male with a known history of COPD, irritable bowel syndrome, hypertension, mild dementia presents from home secondary to altered mental status.  #1 altered mental status-likely secondary to acute respiratory failure , sepsis an hyponatremia. Transfer patient to intensive care unit for intubation secondary to acute respiratory acidosis - CT head normal, also check urine analysis. -Stat ABG has revealed acute respiratory acidosis with hypercapnia -Meets septic criteria with leukocytosis, altered mental status and chest x-ray with infiltrate  #2 acute respiratory failure with acute respiratory acidosis and  hypercapnia secondary to pneumonia and COPD exacerbation  ABG has revealed pH 6.99 and PCO2 at 160 -gas improved after intubation. Vent wean per Pulmonary Appreciate critical care recommendations  #3 right upper lobe spiculated lung mass with infiltrate Oncology is consulted-appreciated Dr pandit's note Provide broad-spectrum antibiotics with vanc and azithromycin  #4 sepsis secondary to right upper lobe pneumonia Patient is started on broad-spectrum IV antibiotics vanc and azithromycin sputum culture and sensitivity pending Started on IV Solu-Medrol-stress dose steroids  #5 acute exacerbation of COPD Patient is started on IV Solu-Medrol 125 mg and will continue daily prednisone Continue IV antibiotics and nebulizer treatments  #6 hyponatremia could be from SIADH secondary to problem #3 Provide IV fluids Monitor sodium closely Sodium is slightly better from 118--> 124 Nephrology consult noted CT head negative  #History of hypothyroidism Check TSH Continue levothyroxine  All the records are reviewed and case discussed with Care Management/Social Workerr.  CODE STATUS: Full code  TOTAL critical care TIME TAKING CARE OF THIS PATIENT: 40 minutes. More than 50% time was spent in coordination of care, discussing with Specialists  POSSIBLE D/C IN ?  DAYS, DEPENDING ON CLINICAL CONDITION.   Joelly Bolanos M.D on 06/27/2015 at 11:45 AM  Between 7am to 6pm - Pager - [phone] After 6pm go to [email or web address] - password [email or web address] [facility]  Office  [phone]  CC: Primary care physician; [name], MD

## 2015-06-27 NOTE — Progress Notes (Signed)
Subjective:  Patient is intubated now. Na 124 Tachycardic    Objective:  Vital signs in last 24 hours:  Temp:  [94.6 F (34.8 C)-101.3 F (38.5 C)] 99.7 F (37.6 C) (10/01 0700) Pulse Rate:  [60-106] 78 (10/01 0700) Resp:  [13-24] 18 (10/01 0700) BP: (79-142)/(44-80) 103/55 mmHg (10/01 0700) SpO2:  [89 %-100 %] 99 % (10/01 0700) FiO2 (%):  [30 %-40 %] 30 % (10/01 0700) Weight:  [68.5 kg (151 lb 0.2 oz)] 68.5 kg (151 lb 0.2 oz) (10/01 0500)  Weight change: 4.996 kg (11 lb 0.2 oz) Filed Weights   06/25/15 1427 06/25/15 2013 06/27/15 0500  Weight: 63.504 kg (140 lb) 66.044 kg (145 lb 9.6 oz) 68.5 kg (151 lb 0.2 oz)    Intake/Output:    Intake/Output Summary (Last 24 hours) at 06/27/15 0807 Last data filed at 06/27/15 0700  Gross per 24 hour  Intake   2545 ml  Output    822 ml  Net   1723 ml     Physical Exam: General: Critically ill  HEENT Eyes open, ETT< OGT  Neck supple  Pulm/lungs Vent assisted  CVS/Heart Tachycardic 120-130  Abdomen:  Soft, NT  Extremities: + some dependent edema  Neurologic: Not following commands  Skin: No rashes  Access:        Basic Metabolic Panel:   Recent Labs Lab 06/24/15 0944  06/25/15 1426 06/25/15 2015 06/26/15 0245 06/26/15 1020 06/27/15 0512  NA 123*  < > 118* 123* 124* 122* 124*  K 4.3  --  4.2  --  4.3  --  3.7  CL 79*  --  77*  --  81*  --  84*  CO2 38*  --  31  --  35*  --  33*  GLUCOSE 118*  --  237*  --  133*  --  257*  BUN 11  --  12  --  11  --  24*  CREATININE 0.78  --  0.85  --  0.73  --  1.32*  CALCIUM 9.3  --  8.3*  --  8.3*  --  8.1*  MG  --   --   --   --   --   --  1.3*  PHOS  --   --   --   --   --   --  2.3*  < > = values in this interval not displayed.   CBC:  Recent Labs Lab 06/25/15 1426 06/26/15 0245 06/27/15 0512  WBC 10.2 12.1* 10.8*  HGB 13.7 13.5 12.4*  HCT 42.1 41.6 37.5*  MCV 93.6 94.3 93.5  PLT 284 271 275      Microbiology:  Recent Results (from the past 720  hour(s))  MRSA PCR Screening     Status: None   Collection Time: 06/26/15  9:50 AM  Result Value Ref Range Status   MRSA by PCR NEGATIVE NEGATIVE Final    Comment:        The GeneXpert MRSA Assay (FDA approved for NASAL specimens only), is one component of a comprehensive MRSA colonization surveillance program. It is not intended to diagnose MRSA infection nor to guide or monitor treatment for MRSA infections.     Coagulation Studies: No results for input(s): LABPROT, INR in the last 72 hours.  Urinalysis:  Recent Labs  06/25/15 2144  COLORURINE YELLOW*  LABSPEC 1.049*  PHURINE 6.0  GLUCOSEU 150*  HGBUR 1+*  BILIRUBINUR NEGATIVE  KETONESUR NEGATIVE  PROTEINUR 100*  NITRITE  NEGATIVE  LEUKOCYTESUR NEGATIVE      Imaging: Dg Chest 1 View  06/26/2015   CLINICAL DATA:  Central line placement, intubation  EXAM: CHEST 1 VIEW  COMPARISON:  06/25/2015 chest radiograph and CT  FINDINGS: Endotracheal tube is appropriately positioned. Left IJ central line tip terminates over the mid SVC. Heart size normal. Lungs are grossly clear. Right upper lobe nodule is not as well seen as on the prior exam, possibly obscured by support apparatus. Nasogastric tube tip terminates below the level of the diaphragms but is not included in the field of view.  IMPRESSION: No new acute finding.  Support apparatus as above.   Electronically Signed   By: Conchita Paris M.D.   On: 06/26/2015 11:23   Dg Chest 2 View  06/25/2015   CLINICAL DATA:  Hyponatremia.  Weakness.  EXAM: CHEST  2 VIEW  COMPARISON:  06/01/2014 and 04/25/2012  FINDINGS: There is a vague nodular appearing density in the right upper lobe between the anterior aspects of the right second and third ribs. There is adjacent oxygen 2 being. This could that be artifactual. Lungs are otherwise clear. Heart size and vascularity are normal. Interposed bowel under the right hemidiaphragm, unchanged. No acute osseous abnormality.  IMPRESSION: Vague  density in the right upper lobe. This may be artifactual. I recommend repeat AP or PA chest x-ray prior to discharge.   Electronically Signed   By: Lorriane Shire M.D.   On: 06/25/2015 16:27   Dg Abd 1 View  06/26/2015   CLINICAL DATA:  Evaluate OG tube placement.  EXAM: ABDOMEN - 1 VIEW  COMPARISON:  06/04/2014  FINDINGS: The OG tube tip is in the stomach. Side port is below the GE junction. No dilated loops of bowel identified. Gas and stool noted within the colon up to the rectum.  IMPRESSION: 1. OG tube tip is in the stomach. 2. Nonobstructive bowel gas pattern.   Electronically Signed   By: Kerby Moors M.D.   On: 06/26/2015 11:49   Ct Head Wo Contrast  06/25/2015   CLINICAL DATA:  Altered mental status.  Weakness.  Hyponatremia.  EXAM: CT HEAD WITHOUT CONTRAST  TECHNIQUE: Contiguous axial images were obtained from the base of the skull through the vertex without intravenous contrast.  COMPARISON:  12/02/2008 head CT.  12/21/2013 brain MRI.  FINDINGS: No evidence of parenchymal hemorrhage or extra-axial fluid collection. No mass lesion, mass effect, or midline shift. No CT evidence of acute infarction. Stable generalized cerebral volume loss. Cerebral ventricle size is are stable and concordant with the degree of cerebral volume loss. Atherosclerotic intracranial arteries. Nonspecific subcortical and periventricular white matter hypodensity, most in keeping with chronic small vessel ischemic change.  The visualized paranasal sinuses are essentially clear. The mastoid air cells are unopacified. No evidence of calvarial fracture.  IMPRESSION: 1. No acute intracranial abnormality. 2. Generalized cerebral volume loss and chronic small vessel ischemic change.   Electronically Signed   By: Ilona Sorrel M.D.   On: 06/25/2015 17:52   Ct Chest W Contrast  06/25/2015   CLINICAL DATA:  Abnormal chest x-ray  EXAM: CT CHEST WITH CONTRAST  TECHNIQUE: Multidetector CT imaging of the chest was performed during  intravenous contrast administration.  CONTRAST:  75m OMNIPAQUE IOHEXOL 300 MG/ML  SOLN  COMPARISON:  Plain film from earlier in the same day  FINDINGS: The left lung is well aerated without focal infiltrate or sizable effusion. The right lung is also well aerated but demonstrates patchy changes  in the posterior aspect of the right upper lobe. Some of these the appearance of early infiltrate although a more organized 15 mm mildly spiculated lesion is noted in the upper lobe best seen on image number 19 of series 3. No other focal lesions are noted.  The thoracic inlet is within normal limits. Calcifications are noted in the thoracic aorta and its branches although no aneurysmal dilatation or dissection is seen. Heavy coronary calcifications are noted particularly in the left anterior descending coronary artery. The pulmonary artery demonstrates a normal branching pattern without definitive filling defect. No significant lymphadenopathy is seen.  Scanning into the upper abdomen reveals no acute abnormality. A left renal cyst is noted. The osseous structures are within normal limits.  IMPRESSION: Somewhat spiculated mass lesion in the right upper lobe with some associated right upper lobe infiltrative changes. This must be viewed with a degree of suspicion for underlying neoplasm. Continued workup is recommended. This lesion would be amenable to percutaneous biopsy if clinically indicated.   Electronically Signed   By: Inez Catalina M.D.   On: 06/25/2015 17:51     Medications:   . sodium chloride 10 mL/hr at 06/27/15 0700  . fentaNYL infusion INTRAVENOUS 25 mcg/hr (06/26/15 2317)  . norepinephrine Stopped (06/26/15 2200)   . antiseptic oral rinse  7 mL Mouth Rinse QID  . chlorhexidine gluconate  15 mL Mouth Rinse BID  . enoxaparin (LOVENOX) injection  40 mg Subcutaneous Q24H  . feeding supplement (VITAL HIGH PROTEIN)  1,000 mL Per Tube Q24H  . insulin aspart  0-9 Units Subcutaneous Q6H  .  ipratropium-albuterol  3 mL Nebulization Q6H  . levothyroxine  50 mcg Oral QAC breakfast  . loratadine  10 mg Oral Daily  . methylPREDNISolone (SOLU-MEDROL) injection  60 mg Intravenous Q6H  . pantoprazole  40 mg Oral Daily  . piperacillin-tazobactam (ZOSYN)  IV  3.375 g Intravenous 3 times per day  . pneumococcal 23 valent vaccine  0.5 mL Intramuscular Tomorrow-1000  . sodium chloride  3 mL Intravenous Q12H  . vancomycin  750 mg Intravenous Q12H   acetaminophen **OR** acetaminophen, fentaNYL, ipratropium, midazolam, midazolam, ondansetron **OR** ondansetron (ZOFRAN) IV  Assessment/ Plan:  81 y.o. male with medical problems of COPD, and diet-controlled diabetes, DJD, hypertension, polyneuropathy, dementia who was admitted to [facility] on 06/25/2015 for evaluation of altered mental status.   1. 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 Urine osm 514 Most recent sodium is 122-124 Recommend starting 3% saline at 30 cc per hour and following sodium every 4 hours Goal for correction of sodium is up to 132 by tomorrow morning 2. Acute hypercarbic respiratory failure  ventilator support 3. Lung malignancy/mass As per pulmonary and oncology team    LOS: 2 Majel Giel 10/1/20168:07 AM

## 2015-06-27 NOTE — Progress Notes (Signed)
Nutrition Follow-up   INTERVENTION:   EN: Spoke with RN Pam who reports pt tolerating TF very well this am, therefore recommend continuing to titrate to goal rate of 28m/hr of Vital High Protein. RD notes that e-link MD wanting to discontinue free water flushes at this time secondary to hyponatremia. RN Pam reports that tube is being flushed with water with medications at this time and is infusing without difficulty. Will recommend restarting free water flushes once hyponatremia resolved to meet hydration needs and maintain patency to tube.  Coordination of Care: pt may benefit from stronger bowel regimen    NUTRITION DIAGNOSIS:   Inadequate oral intake related to acute illness as evidenced by NPO status.  GOAL:   Provide needs based on ASPEN/SCCM guidelines  MONITOR:    (Energy Intake, Anthropometrics, Electrolyte/Renal Profile, Glucose Profile, Digestive system)  REASON FOR ASSESSMENT:   Ventilator    ASSESSMENT:    Pt remains intubated on the vent at this time. Oncology following for possibly lung mass. Per MD note, hyponatremia from possible SIADH.  Diet Order:  Diet NPO time specified    Current Nutrition: Pt tolerating Vital High Protein at 548mhr this am via OG tube without difficulty per RN Pam with minimal residuals.   Gastrointestinal Profile: Last BM: 06/24/2015   Medications: fentanyl, 3% NaCL at 3013mr, NS at 71m73m, prednisone, novolog  Electrolyte/Renal Profile and Glucose Profile:   Recent Labs Lab 06/25/15 1426  06/26/15 0245 06/26/15 1020 06/27/15 0512  NA 118*  < > 124* 122* 124*  K 4.2  --  4.3  --  3.7  CL 77*  --  81*  --  84*  CO2 31  --  35*  --  33*  BUN 12  --  11  --  24*  CREATININE 0.85  --  0.73  --  1.32*  CALCIUM 8.3*  --  8.3*  --  8.1*  MG  --   --   --   --  1.3*  PHOS  --   --   --   --  2.3*  GLUCOSE 237*  --  133*  --  257*  < > = values in this interval not displayed. Protein Profile:  Recent Labs Lab  06/24/15 0944  ALBUMIN 4.0     Weight Trend since Admission: Filed Weights   06/25/15 1427 06/25/15 2013 06/27/15 0500  Weight: 140 lb (63.504 kg) 145 lb 9.6 oz (66.044 kg) 151 lb 0.2 oz (68.5 kg)     Skin:  Reviewed, no issues    BMI:  Body mass index is 23.65 kg/(m^2).  Estimated Nutritional Needs:   Kcal:  1467 kcals (BEE 1314, Ve; 7.7, Tmax: 37) using current wt of 66 kg  Protein:  99-132 g (1.5-2.0 g/kg)   Fluid:  1650-1950 mL (25-20 ml/kg)     HIGH Care Level  AllyDwyane Luo, LDN Pager [phone]

## 2015-06-28 ENCOUNTER — Inpatient Hospital Stay: Payer: Medicare Other

## 2015-06-28 LAB — SODIUM
SODIUM: 134 mmol/L — AB (ref 135–145)
Sodium: 132 mmol/L — ABNORMAL LOW (ref 135–145)

## 2015-06-28 LAB — BLOOD GAS, ARTERIAL
ACID-BASE EXCESS: 5.6 mmol/L — AB (ref 0.0–3.0)
ACID-BASE EXCESS: 1.7 mmol/L (ref 0.0–3.0)
Allens test (pass/fail): POSITIVE — AB
BICARBONATE: 34.2 meq/L — AB (ref 21.0–28.0)
Bicarbonate: 31.3 mEq/L — ABNORMAL HIGH (ref 21.0–28.0)
FIO2: 0.3
FIO2: 0.3
MECHANICAL RATE: 12
MECHVT: 430 mL
MODE: POSITIVE
O2 Saturation: 98.8 %
O2 Saturation: 96.8 %
PCO2 ART: 73 mmHg — AB (ref 32.0–48.0)
PEEP/CPAP: 5 cmH2O
PEEP: 5 cmH2O
PH ART: 7.31 — AB (ref 7.350–7.450)
PH ART: 7.24 — AB (ref 7.350–7.450)
PRESSURE SUPPORT: 5 cmH2O
Patient temperature: 37
Patient temperature: 37
pCO2 arterial: 68 mmHg — ABNORMAL HIGH (ref 32.0–48.0)
pO2, Arterial: 133 mmHg — ABNORMAL HIGH (ref 83.0–108.0)
pO2, Arterial: 102 mmHg (ref 83.0–108.0)

## 2015-06-28 LAB — GLUCOSE, CAPILLARY
GLUCOSE-CAPILLARY: 223 mg/dL — AB (ref 65–99)
GLUCOSE-CAPILLARY: 290 mg/dL — AB (ref 65–99)
Glucose-Capillary: 221 mg/dL — ABNORMAL HIGH (ref 65–99)
Glucose-Capillary: 233 mg/dL — ABNORMAL HIGH (ref 65–99)
Glucose-Capillary: 301 mg/dL — ABNORMAL HIGH (ref 65–99)

## 2015-06-28 LAB — CBC
HCT: 38 % — ABNORMAL LOW (ref 40.0–52.0)
Hemoglobin: 12.5 g/dL — ABNORMAL LOW (ref 13.0–18.0)
MCH: 30.7 pg (ref 26.0–34.0)
MCHC: 32.8 g/dL (ref 32.0–36.0)
MCV: 93.7 fL (ref 80.0–100.0)
Platelets: 265 10*3/uL (ref 150–440)
RBC: 4.06 MIL/uL — ABNORMAL LOW (ref 4.40–5.90)
RDW: 13.2 % (ref 11.5–14.5)
WBC: 13 10*3/uL — ABNORMAL HIGH (ref 3.8–10.6)

## 2015-06-28 LAB — BASIC METABOLIC PANEL
Anion gap: 7 (ref 5–15)
BUN: 27 mg/dL — AB (ref 6–20)
CALCIUM: 8.5 mg/dL — AB (ref 8.9–10.3)
CO2: 33 mmol/L — AB (ref 22–32)
CREATININE: 1.06 mg/dL (ref 0.61–1.24)
Chloride: 94 mmol/L — ABNORMAL LOW (ref 101–111)
GFR calc non Af Amer: >60 mL/min (ref >60)
Glucose, Bld: 265 mg/dL — ABNORMAL HIGH (ref 65–99)
Potassium: 3.9 mmol/L (ref 3.5–5.1)
SODIUM: 134 mmol/L — AB (ref 135–145)

## 2015-06-28 MED ORDER — METOPROLOL TARTRATE 25 MG PO TABS
25.0000 mg | ORAL_TABLET | Freq: Two times a day (BID) | ORAL | Status: DC
  Administered 2015-06-28 – 2015-07-01 (×8): 25 mg
  Filled 2015-06-28 (×16): qty 1

## 2015-06-28 MED ORDER — PNEUMOCOCCAL VAC POLYVALENT 25 MCG/0.5ML IJ INJ: 0.5000 mL | INJECTION | INTRAMUSCULAR | Status: DC | PRN

## 2015-06-28 MED ORDER — METOPROLOL TARTRATE 1 MG/ML IV SOLN
2.5000 mg | INTRAVENOUS | Status: DC | PRN
  Administered 2015-06-30: 2.5 mg via INTRAVENOUS
  Administered 2015-07-01: 5 mg via INTRAVENOUS
  Filled 2015-06-28 (×2): qty 5

## 2015-06-28 NOTE — Progress Notes (Signed)
Metoprolol oral suspension was ordered for patient. Per  protocol pharmacy changed to metoprolol tartrate tablets. Tablets can be crushed and given via tube.   Nancy Fetter, PharmD Pharmacy Resident

## 2015-06-28 NOTE — Progress Notes (Signed)
Subjective:  Patient remains on vent support. Na 134 with 3% saline- now discontinued Tachycardic    Objective:  Vital signs in last 24 hours:  Temp:  [97.9 F (36.6 C)-99.7 F (37.6 C)] 98.2 F (36.8 C) (10/02 0800) Pulse Rate:  [70-127] 116 (10/02 0800) Resp:  [12-18] 16 (10/02 0800) BP: (91-177)/(51-96) 144/60 mmHg (10/02 0800) SpO2:  [96 %-100 %] 96 % (10/02 0818) FiO2 (%):  [30 %] 30 % (10/02 0851) Weight:  [68.5 kg (151 lb 0.2 oz)] 68.5 kg (151 lb 0.2 oz) (10/02 0500)  Weight change: 0 kg (0 lb) Filed Weights   06/25/15 2013 06/27/15 0500 06/28/15 0500  Weight: 66.044 kg (145 lb 9.6 oz) 68.5 kg (151 lb 0.2 oz) 68.5 kg (151 lb 0.2 oz)    Intake/Output:    Intake/Output Summary (Last 24 hours) at 06/28/15 0918 Last data filed at 06/28/15 0700  Gross per 24 hour  Intake 2321.85 ml  Output   1550 ml  Net 771.85 ml     Physical Exam: General: Critically ill  HEENT Eyes open, ETT, OGT  Neck supple  Pulm/lungs Vent assisted  CVS/Heart Tachycardic 120-130  Abdomen:  Soft, NT  Extremities: + some dependent edema  Neurologic: Appears to be following few simple commands, tremor noted in hand  Skin: No acute rashes  Access:        Basic Metabolic Panel:   Recent Labs Lab 06/24/15 0944  06/25/15 1426  06/26/15 0245  06/27/15 0512 06/27/15 1203 06/27/15 1718 06/27/15 1934 06/27/15 2330 06/28/15 0451  NA 123*  < > 118*  < > 124*  < > 124* 124* 126* 129* 132* 134*  134*  K 4.3  --  4.2  --  4.3  --  3.7  --   --   --   --  3.9  CL 79*  --  77*  --  81*  --  84*  --   --   --   --  94*  CO2 38*  --  31  --  35*  --  33*  --   --   --   --  33*  GLUCOSE 118*  --  237*  --  133*  --  257*  --   --   --   --  265*  BUN 11  --  12  --  11  --  24*  --   --   --   --  27*  CREATININE 0.78  --  0.85  --  0.73  --  1.32*  --   --   --   --  1.06  CALCIUM 9.3  --  8.3*  --  8.3*  --  8.1*  --   --   --   --  8.5*  MG  --   --   --   --   --   --  1.3*  --    --   --   --   --   PHOS  --   --   --   --   --   --  2.3*  --   --   --   --   --   < > = values in this interval not displayed.   CBC:  Recent Labs Lab 06/25/15 1426 06/26/15 0245 06/27/15 0512 06/28/15 0451  WBC 10.2 12.1* 10.8* 13.0*  HGB 13.7 13.5 12.4* 12.5*  HCT 42.1 41.6 37.5* 38.0*  MCV 93.6 94.3 93.5 93.7  PLT 284 271 275 265      Microbiology:  Recent Results (from the past 720 hour(s))  Blood culture (routine x 2)     Status: None (Preliminary result)   Collection Time: 06/25/15  5:19 PM  Result Value Ref Range Status   Specimen Description BLOOD RIGHT ARM  Final   Special Requests [id number]  Final   Culture NO GROWTH 2 DAYS  Final   Report Status PENDING  Incomplete  Blood culture (routine x 2)     Status: None (Preliminary result)   Collection Time: 06/25/15  5:57 PM  Result Value Ref Range Status   Specimen Description BLOOD RIGHT HAND  Final   Special Requests BAA, [id number]  Final   Culture NO GROWTH 2 DAYS  Final   Report Status PENDING  Incomplete  MRSA PCR Screening     Status: None   Collection Time: 06/26/15  9:50 AM  Result Value Ref Range Status   MRSA by PCR NEGATIVE NEGATIVE Final    Comment:        The GeneXpert MRSA Assay (FDA approved for NASAL specimens only), is one component of a comprehensive MRSA colonization surveillance program. It is not intended to diagnose MRSA infection nor to guide or monitor treatment for MRSA infections.   Culture, respiratory (NON-Expectorated)     Status: None (Preliminary result)   Collection Time: 06/26/15 12:26 PM  Result Value Ref Range Status   Specimen Description TRACHEAL ASPIRATE  Final   Special Requests Normal  Final   Gram Stain PENDING  Incomplete   Culture HOLDING FOR POSSIBLE PATHOGEN  Final   Report Status PENDING  Incomplete    Coagulation Studies: No results for input(s): LABPROT, INR in the last 72 hours.  Urinalysis:  Recent Labs  06/25/15 2144   COLORURINE YELLOW*  LABSPEC 1.049*  PHURINE 6.0  GLUCOSEU 150*  HGBUR 1+*  BILIRUBINUR NEGATIVE  KETONESUR NEGATIVE  PROTEINUR 100*  NITRITE NEGATIVE  LEUKOCYTESUR NEGATIVE      Imaging: Dg Chest 1 View  06/28/2015   CLINICAL DATA:  Hypoxia  EXAM: CHEST 1 VIEW  COMPARISON:  June 26, 2015 chest radiograph and chest CT June 25, 2015  FINDINGS: Endotracheal tube tip is 3.4 cm above the carina. Central catheter tip is in the superior vena cava. Nasogastric tube tip and side port are in the stomach. No pneumothorax. No edema or consolidation. The known nodular lesion in the right upper lobe seen on CT is not appreciable by radiography. Heart size and pulmonary vascularity are normal. No adenopathy.  IMPRESSION: Tube and catheter positions as described without pneumothorax. No edema or consolidation. Right upper lobe nodular lesions seen on recent CT is not appreciable on this examination.   Electronically Signed   By: Lowella Grip III M.D.   On: 06/28/2015 08:00   Dg Chest 1 View  06/26/2015   CLINICAL DATA:  Central line placement, intubation  EXAM: CHEST 1 VIEW  COMPARISON:  06/25/2015 chest radiograph and CT  FINDINGS: Endotracheal tube is appropriately positioned. Left IJ central line tip terminates over the mid SVC. Heart size normal. Lungs are grossly clear. Right upper lobe nodule is not as well seen as on the prior exam, possibly obscured by support apparatus. Nasogastric tube tip terminates below the level of the diaphragms but is not included in the field of view.  IMPRESSION: No new acute finding.  Support apparatus as above.   Electronically Signed   By:  Conchita Paris M.D.   On: 06/26/2015 11:23   Dg Abd 1 View  06/26/2015   CLINICAL DATA:  Evaluate OG tube placement.  EXAM: ABDOMEN - 1 VIEW  COMPARISON:  06/04/2014  FINDINGS: The OG tube tip is in the stomach. Side port is below the GE junction. No dilated loops of bowel identified. Gas and stool noted within the colon  up to the rectum.  IMPRESSION: 1. OG tube tip is in the stomach. 2. Nonobstructive bowel gas pattern.   Electronically Signed   By: Kerby Moors M.D.   On: 06/26/2015 11:49     Medications:   . sodium chloride 10 mL/hr at 06/27/15 0700  . dexmedetomidine 0.7 mcg/kg/hr (06/28/15 0815)  . fentaNYL infusion INTRAVENOUS 125 mcg/hr (06/28/15 0600)  . norepinephrine Stopped (06/26/15 2200)   . antiseptic oral rinse  7 mL Mouth Rinse QID  . azithromycin  500 mg Intravenous Q24H  . chlorhexidine gluconate  15 mL Mouth Rinse BID  . enoxaparin (LOVENOX) injection  40 mg Subcutaneous Q24H  . famotidine  20 mg Oral Daily  . feeding supplement (VITAL HIGH PROTEIN)  1,000 mL Per Tube Q24H  . insulin aspart  0-9 Units Subcutaneous Q6H  . ipratropium-albuterol  3 mL Nebulization Q6H  . levothyroxine  50 mcg Oral QAC breakfast  . metoprolol tartrate  25 mg Per Tube BID  . pneumococcal 23 valent vaccine  0.5 mL Intramuscular Tomorrow-1000  . predniSONE  30 mg Oral Q breakfast  . sodium chloride  3 mL Intravenous Q12H   acetaminophen **OR** acetaminophen, fentaNYL, ipratropium, loratadine, metoprolol, midazolam, midazolam, ondansetron **OR** ondansetron (ZOFRAN) IV  Assessment/ Plan:  81 y.o. male with medical problems of COPD, and diet-controlled diabetes, DJD, hypertension, polyneuropathy, dementia who was admitted to [facility] on 06/25/2015 for evaluation of altered mental status.   1. 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 Urine osm 514 Most recent sodium is 134 after 3% saline at 30 cc per hour was instituted Na at goal. 3% discontinued 2. Acute hypercarbic respiratory failure  ventilator support 3. Lung malignancy/mass As per pulmonary and oncology team  Will sign off Please call if further assistance is necessary    LOS: 3 Aaron Hendrix 10/2/20169:18 AM

## 2015-06-28 NOTE — Consult Note (Addendum)
PULMONARY / CRITICAL CARE MEDICINE   Name: Aaron Hendrix MRN: [id number] DOB: [birth year]    ADMISSION DATE:  06/25/2015 The patient was admitted on September 29 with acute unresponsiveness. He was subsequently intubated. 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 This is thought to be secondary to a possible lung cancer as the patient has a lung nodule. His weaning has been complicated by hypercapnia due to hypoventilation. The patient was admitted with unresponsiveness  ASSESSMENT/PLAN     PULMONARY ETT - 8.0 A: Respiratory failure, hypoxic and hypercapnic, acute Possible RUL PNA RUL nodule. Hypercapnic respiratory failure, with hypoventilation, this appears to be chronic as the patient CO2 levels appear to be chronically elevated, going back to May 2015. Possible acute bronchitis, doubt pneumonia. P:   - cont with MV, wean as tolerated. This morning the patient continues to be hypercapnic, this may be worsened by fentanyl. We'll try to switch this to something that is less respiratory suppressing, the family preferred that I not use Seroquel. However, the patient continues to have significant anxiety upon weaning. We'll consider using Ativan when necessary - abg PRN - RUL spiculated lesion, concerned for malignancy - can be worked up after this acute episode is over - RUL lesion could be driving hyponatremia, SIADH like picture. - lesion will require biopsy in the future.  - We will decrease antibiotic coverage, await further cultures. Doubt COPD exacerbation. We'll continue to wean down steroids.  --Addendum patient failed weaning trial due to worsening hypercapnia, therefore, was placed on previous vent settings.  CARDIOVASCULAR CVL - LIG Hypotension. Tachycardia, made worse by weaning, possibly due to anxiety and agitation. P:  - Resolved. -We'll use when necessary meds for tachycardia.  RENAL A:  Hyponatremia , now improved after  receiving 3% hypertonic saline. P:   - could be the result RUL lesion or RUL PNA - Free water restriction. We will discontinue the hypertonic saline today. -nephrology following  GASTROINTESTINAL Hx of IBS No acute issues at this time. We'll continue H2 blocker.  HEMATOLOGIC A:  RUL spiculated lung lesion - 1.5cm P:  - workup in the future, with biopsy (possibly ENB bronchoscopy when the patient is stable.)  INFECTIOUS A:  Suspect acute bronchitis. P:   BCx2 9/29 Trach Asp 9/30>> showed strep pneumo Abx: vanc/zosyn 9/30, discontinued. Azithro 9/29>>9/30    NEUROLOGIC A:   Agitation, delirium,  tremor. Encephalopathy  CO2 narcosis P:   We'll try to start the patient on a benzo for agitation/anxiety. Given his elevated CO2. We'll avoid fentanyl and other opiates as this appeared to make his hypercapnia worse. The family preferred not to start the patient on Seroquel.  Chest x-ray images and report were reviewed. No acute abnormalities detected.  This was discussed with the critical care nurse and with the family at bedside. Case discussed with the critical care nurse  CHIEF COMPLAINT:   Unresponsiveness   HISTORY OF PRESENT ILLNESS  Patient continues to be on the ventilator, he is sedated but arousable. Patient was very agitated this morning  SIGNIFICANT EVENTS  9/29>>admitted for low Na 9/30>>unresponsive, elevated co2, respiratory failure>>intubate   Review of Systems  Unable to perform ROS: critical illness      VITAL SIGNS    Temp:  [97.9 F (36.6 C)-99.9 F (37.7 C)] 98.1 F (36.7 C) (10/02 0600) Pulse Rate:  [70-127] 72 (10/02 0600) Resp:  [12-20] 12 (10/02 0600) BP: (91-180)/(51-96) 135/67 mmHg (10/02 0600) SpO2:  [95 %-100 %]  96 % (10/02 0818) FiO2 (%):  [30 %] 30 % (10/02 0818) Weight:  [68.5 kg (151 lb 0.2 oz)] 68.5 kg (151 lb 0.2 oz) (10/02 0500) HEMODYNAMICS: CVP:  [3 mmHg-14 mmHg] 12 mmHg VENTILATOR SETTINGS: Vent Mode:  [-]  PRVC FiO2 (%):  [30 %] 30 % Set Rate:  [12 bmp] 12 bmp Vt Set:  [430 mL] 430 mL PEEP:  [5 cmH20] 5 cmH20 INTAKE / OUTPUT:  Intake/Output Summary (Last 24 hours) at 06/28/15 0825 Last data filed at 06/28/15 0600  Gross per 24 hour  Intake 2341.85 ml  Output   1550 ml  Net 791.85 ml       PHYSICAL EXAM   Physical Exam  Constitutional:  Mildly thin appearing male, in moderate respiratory distress, unresponsive to verbal command, withdrawals from pain only.  Eyes: EOM are normal. Pupils are equal, round, and reactive to light. Right eye exhibits no discharge.  Neck: Normal range of motion.  Cardiovascular: Regular rhythm, normal heart sounds and intact distal pulses.   Pulmonary/Chest: He has no wheezes. He has no rales.  Respiratory failure.  Dec bs bilaterally, no wheezes, no rales.   Abdominal: Soft. Bowel sounds are normal.  Musculoskeletal: He exhibits no edema.  Neurological:  Unresponsive.   Skin: Skin is warm and dry.  Nursing note and vitals reviewed.      LABS   LABS:  CBC  Recent Labs Lab 06/26/15 0245 06/27/15 0512 06/28/15 0451  WBC 12.1* 10.8* 13.0*  HGB 13.5 12.4* 12.5*  HCT 41.6 37.5* 38.0*  PLT 271 275 265   Coag's No results for input(s): APTT, INR in the last 168 hours. BMET  Recent Labs Lab 06/26/15 0245  06/27/15 0512  06/27/15 1934 06/27/15 2330 06/28/15 0451  NA 124*  < > 124*  < > 129* 132* 134*  134*  K 4.3  --  3.7  --   --   --  3.9  CL 81*  --  84*  --   --   --  94*  CO2 35*  --  33*  --   --   --  33*  BUN 11  --  24*  --   --   --  27*  CREATININE 0.73  --  1.32*  --   --   --  1.06  GLUCOSE 133*  --  257*  --   --   --  265*  < > = values in this interval not displayed. Electrolytes  Recent Labs Lab 06/26/15 0245 06/27/15 0512 06/28/15 0451  CALCIUM 8.3* 8.1* 8.5*  MG  --  1.3*  --   PHOS  --  2.3*  --    Sepsis Markers No results for input(s): LATICACIDVEN, PROCALCITON, O2SATVEN in the last 168  hours. ABG  Recent Labs Lab 06/26/15 1150 06/27/15 0845 06/28/15 0432  PHART 7.60* 7.23* 7.31*  PCO2ART 29* 82* 68*  PO2ART 99 92 133*   Liver Enzymes  Recent Labs Lab 06/24/15 0944  AST 23  ALT 12  ALKPHOS 75  BILITOT 0.6  ALBUMIN 4.0   Cardiac Enzymes  Recent Labs Lab 06/25/15 1426 06/26/15 1831  TROPONINI <0.03 0.06*   Glucose  Recent Labs Lab 06/26/15 2344 06/27/15 0550 06/27/15 1120 06/27/15 1721 06/28/15 0004 06/28/15 0611  GLUCAP 244* 265* 279* 192* 223* 221*     Recent Results (from the past 240 hour(s))  Blood culture (routine x 2)     Status: None (Preliminary result)  Collection Time: 06/25/15  5:19 PM  Result Value Ref Range Status   Specimen Description BLOOD RIGHT ARM  Final   Special Requests BAA,AER5ML,ANA5ML  Final   Culture NO GROWTH 2 DAYS  Final   Report Status PENDING  Incomplete  Blood culture (routine x 2)     Status: None (Preliminary result)   Collection Time: 06/25/15  5:57 PM  Result Value Ref Range Status   Specimen Description BLOOD RIGHT HAND  Final   Special Requests BAA, ANA5ML,AER5ML  Final   Culture NO GROWTH 2 DAYS  Final   Report Status PENDING  Incomplete  MRSA PCR Screening     Status: None   Collection Time: 06/26/15  9:50 AM  Result Value Ref Range Status   MRSA by PCR NEGATIVE NEGATIVE Final    Comment:        The GeneXpert MRSA Assay (FDA approved for NASAL specimens only), is one component of a comprehensive MRSA colonization surveillance program. It is not intended to diagnose MRSA infection nor to guide or monitor treatment for MRSA infections.   Culture, respiratory (NON-Expectorated)     Status: None (Preliminary result)   Collection Time: 06/26/15 12:26 PM  Result Value Ref Range Status   Specimen Description TRACHEAL ASPIRATE  Final   Special Requests Normal  Final   Gram Stain PENDING  Incomplete   Culture HOLDING FOR POSSIBLE PATHOGEN  Final   Report Status PENDING  Incomplete       IMAGING    Dg Chest 1 View  06/28/2015   CLINICAL DATA:  Hypoxia  EXAM: CHEST 1 VIEW  COMPARISON:  June 26, 2015 chest radiograph and chest CT June 25, 2015  FINDINGS: Endotracheal tube tip is 3.4 cm above the carina. Central catheter tip is in the superior vena cava. Nasogastric tube tip and side port are in the stomach. No pneumothorax. No edema or consolidation. The known nodular lesion in the right upper lobe seen on CT is not appreciable by radiography. Heart size and pulmonary vascularity are normal. No adenopathy.  IMPRESSION: Tube and catheter positions as described without pneumothorax. No edema or consolidation. Right upper lobe nodular lesions seen on recent CT is not appreciable on this examination.   Electronically Signed   By: Lowella Grip III M.D.   On: 06/28/2015 08:00      Indwelling Urinary Catheter continued, requirement due to   Reason to continue Indwelling Urinary Catheter for strict Intake/Output monitoring for hemodynamic instability   Central Line continued, requirement due to   Reason to continue [employer] Monitoring of central venous pressure or other hemodynamic parameters   Ventilator continued, requirement due to, resp failure    Ventilator Sedation RASS 0 to -2      Deep Ashby Dawes, M.D.  [facility] On Call Pager [phone] (please enter 7-digits)   Critical Care Attestation.  I have personally obtained a history, examined the patient, evaluated laboratory and imaging results, formulated the assessment and plan and placed orders. The Patient requires high complexity decision making for assessment and support, frequent evaluation and titration of therapies, application of advanced monitoring technologies and extensive interpretation of multiple databases. The patient has critical illness that could lead imminently to failure of 1 or more organ systems and requires the highest level of physician  preparedness to intervene.  Critical Care Time devoted to patient care services described in this note is 35 minutes and is exclusive of time spent in procedures.  06/28/2015, 8:25 AM

## 2015-06-28 NOTE — Progress Notes (Signed)
[place] at [place] NAME: Aaron Hendrix    MR#:  [id number]  DATE OF BIRTH:  [birth year]  SUBJECTIVE:   pt intubated and on the vent day 2. now on Fenatnyl and precedex. Much calm, follows commands, tolerating TF REVIEW OF SYSTEMS:  Review of systems unobtainable DRUG ALLERGIES:   Allergies  Allergen Reactions  . Fexofenadine Other (See Comments)  . Quinapril Hcl Other (See Comments)    Unknown   . Latex Rash    VITALS:  Blood pressure 135/67, pulse 72, temperature 98.1 F (36.7 C), temperature source Rectal, resp. rate 12, height '5\' 7"'$  (1.702 m), weight 68.5 kg (151 lb 0.2 oz), SpO2 100 %.  PHYSICAL EXAMINATION:  GENERAL:  81 y.o.-year-old patient lying in the bed with no acute distress. Critically ill and on the vent EYES: Pupils equal, round, reactive to light and accommodation. No scleral icterus.  HEENT: Head atraumatic, normocephalic.  NECK:  Supple, no jugular venous distention. No thyroid enlargement, no tenderness.  LUNGS: Shallow respirations with a decreased breath sounds bilaterally, minimal wheezing, no rales,rhonchi or crepitation. No use of accessory muscles of respiration.  CARDIOVASCULAR: S1, S2 normal. No murmurs, rubs, or gallops.tachycardia+ ABDOMEN: Soft, nontender, nondistended. Bowel sounds present. No organomegaly or mass.  EXTREMITIES: No pedal edema, cyanosis, or clubbing.  NEUROLOGIC: on the vent PSYCHIATRIC: on the vent SKIN: No obvious rash, lesion, or ulcer.   LABORATORY PANEL:   CBC  Recent Labs Lab 06/28/15 0451  WBC 13.0*  HGB 12.5*  HCT 38.0*  PLT 265   ------------------------------------------------------------------------------------------------------------------  Chemistries   Recent Labs Lab 06/24/15 0944  06/27/15 0512  06/28/15 0451  NA 123*  < > 124*  < > 134*  134*  K 4.3  < > 3.7  --  3.9  CL 79*  < > 84*  --  94*  CO2 38*  < > 33*  --  33*  GLUCOSE 118*  <  > 257*  --  265*  BUN 11  < > 24*  --  27*  CREATININE 0.78  < > 1.32*  --  1.06  CALCIUM 9.3  < > 8.1*  --  8.5*  MG  --   --  1.3*  --   --   AST 23  --   --   --   --   ALT 12  --   --   --   --   ALKPHOS 75  --   --   --   --   BILITOT 0.6  --   --   --   --   < > = values in this interval not displayed. ------------------------------------------------------------------------------------------------------------------  Cardiac Enzymes  Recent Labs Lab 06/26/15 1831  TROPONINI 0.06*   ------------------------------------------------------------------------------------------------------------------  RADIOLOGY:  Dg Chest 1 View  06/26/2015   CLINICAL DATA:  Central line placement, intubation  EXAM: CHEST 1 VIEW  COMPARISON:  06/25/2015 chest radiograph and CT  FINDINGS: Endotracheal tube is appropriately positioned. Left IJ central line tip terminates over the mid SVC. Heart size normal. Lungs are grossly clear. Right upper lobe nodule is not as well seen as on the prior exam, possibly obscured by support apparatus. Nasogastric tube tip terminates below the level of the diaphragms but is not included in the field of view.  IMPRESSION: No new acute finding.  Support apparatus as above.   Electronically Signed   By: Conchita Paris M.D.   On: 06/26/2015 11:23  Dg Abd 1 View  06/26/2015   CLINICAL DATA:  Evaluate OG tube placement.  EXAM: ABDOMEN - 1 VIEW  COMPARISON:  06/04/2014  FINDINGS: The OG tube tip is in the stomach. Side port is below the GE junction. No dilated loops of bowel identified. Gas and stool noted within the colon up to the rectum.  IMPRESSION: 1. OG tube tip is in the stomach. 2. Nonobstructive bowel gas pattern.   Electronically Signed   By: Kerby Moors M.D.   On: 06/26/2015 11:49    EKG:   Orders placed or performed during the hospital encounter of 06/25/15  . EKG 12-Lead  . EKG 12-Lead  . ED EKG  . ED EKG    ASSESSMENT AND PLAN:   Aaron Hendrix is  a 81 y.o. male with a known history of COPD, irritable bowel syndrome, hypertension, mild dementia presents from home secondary to altered mental status.  #1 altered mental status-likely secondary to acute hypoxic respiratory failure,sepsis and hyponatremia. - CT head normal -ABG has revealed acute respiratory acidosis with hypercapnia---.shows improvement with ABG -Meets septic criteria with leukocytosis, altered mental status and chest x-ray with infiltrate/mass  #2 acute respiratory failure with acute respiratory acidosis and hypercapnia secondary to pneumonia and COPD exacerbation -Initial ABG  revealed pH 6.99 and PCO2 at 160 -gas improved after intubation. Vent wean per Pulmonary  #3 right upper lobe spiculated lung mass with infiltrate Oncology is consulted-appreciated Dr pandit's note Provide broad-spectrum antibiotics with azithromycin -vanc d/ced  #4 sepsis secondary to right upper lobe pneumonia -on IV  Azithromycin -BC negative -Tracheal aspirate pending   #5 acute exacerbation of COPD Patient was given IV Solu-Medrol 125 mg and will continue daily prednisone Continue IV antibiotics and nebulizer treatments  #6 hyponatremia could be from SIADH secondary to problem #3 IV 3% NS (10/1)  Monitor sodium closely Sodium is slightly better from 118--> 124--->134 Nephrology consult noted CT head negative  #7 History of hypothyroidism Continue levothyroxine  #8 Nutrition Tolerating TF  All the records are reviewed and case discussed with Care Management/Social Worker. D/w son in the waiting room  CODE STATUS: Full code  TOTAL critical care TIME TAKING CARE OF THIS PATIENT: 40 minutes. More than 50% time was spent in coordination of care, discussing with family  POSSIBLE D/C IN ?  DAYS, DEPENDING ON CLINICAL CONDITION.   Hung Rhinesmith M.D on 06/28/2015 at 6:59 AM  Between 7am to 6pm - Pager - [phone] After 6pm go to [email or web address] - password [email or web address] [facility]  Office  [phone]  CC: Primary care physician; [name], MD

## 2015-06-28 NOTE — Progress Notes (Signed)
Patient UOP this shift 220, admit with UTI. Patient NPO for swallow evaluation on 10/11-2014. Per Dr. Edwina Barth, increase NS to 120 ml/hr.

## 2015-06-29 ENCOUNTER — Inpatient Hospital Stay: Payer: Medicare Other

## 2015-06-29 DIAGNOSIS — J441 Chronic obstructive pulmonary disease with (acute) exacerbation: Secondary | ICD-10-CM

## 2015-06-29 LAB — BLOOD GAS, ARTERIAL
ACID-BASE EXCESS: 7 mmol/L — AB (ref 0.0–3.0)
ALLENS TEST (PASS/FAIL): POSITIVE — AB
Acid-Base Excess: 3.8 mmol/L — ABNORMAL HIGH (ref 0.0–3.0)
Acid-Base Excess: 4.5 mmol/L — ABNORMAL HIGH (ref 0.0–3.0)
BICARBONATE: 35.3 meq/L — AB (ref 21.0–28.0)
Bicarbonate: 34.3 mEq/L — ABNORMAL HIGH (ref 21.0–28.0)
Bicarbonate: 34.1 mEq/L — ABNORMAL HIGH (ref 21.0–28.0)
FIO2: 0.3
FIO2: 0.3
FIO2: 0.3
LHR: 12 {breaths}/min
MECHVT: 430 mL
Mechanical Rate: 12
O2 SAT: 95.5 %
O2 Saturation: 98.2 %
O2 Saturation: 97.3 %
PATIENT TEMPERATURE: 37
PATIENT TEMPERATURE: 37
PCO2 ART: 82 mmHg — AB (ref 32.0–48.0)
PCO2 ART: 67 mmHg — AB (ref 32.0–48.0)
PEEP/CPAP: 5 cmH2O
PEEP/CPAP: 5 cmH2O
PEEP: 5 cmH2O
PH ART: 7.23 — AB (ref 7.350–7.450)
PH ART: 7.33 — AB (ref 7.350–7.450)
PO2 ART: 92 mmHg (ref 83.0–108.0)
PO2 ART: 115 mmHg — AB (ref 83.0–108.0)
PO2 ART: 106 mmHg (ref 83.0–108.0)
Patient temperature: 37
Pressure support: 5 cmH2O
Pressure support: 8 cmH2O
pCO2 arterial: 76 mmHg (ref 32.0–48.0)
pH, Arterial: 7.26 — ABNORMAL LOW (ref 7.350–7.450)

## 2015-06-29 LAB — CBC
HEMATOCRIT: 37.8 % — AB (ref 40.0–52.0)
HEMOGLOBIN: 12.1 g/dL — AB (ref 13.0–18.0)
MCH: 30.4 pg (ref 26.0–34.0)
MCHC: 32.1 g/dL (ref 32.0–36.0)
MCV: 94.8 fL (ref 80.0–100.0)
Platelets: 257 10*3/uL (ref 150–440)
RBC: 3.98 MIL/uL — ABNORMAL LOW (ref 4.40–5.90)
RDW: 13.2 % (ref 11.5–14.5)
WBC: 12 10*3/uL — ABNORMAL HIGH (ref 3.8–10.6)

## 2015-06-29 LAB — GLUCOSE, CAPILLARY
GLUCOSE-CAPILLARY: 191 mg/dL — AB (ref 65–99)
Glucose-Capillary: 286 mg/dL — ABNORMAL HIGH (ref 65–99)
Glucose-Capillary: 210 mg/dL — ABNORMAL HIGH (ref 65–99)

## 2015-06-29 LAB — BASIC METABOLIC PANEL
ANION GAP: 1 — AB (ref 5–15)
BUN: 32 mg/dL — ABNORMAL HIGH (ref 6–20)
CHLORIDE: 99 mmol/L — AB (ref 101–111)
CO2: 35 mmol/L — AB (ref 22–32)
CREATININE: 0.78 mg/dL (ref 0.61–1.24)
Calcium: 8.2 mg/dL — ABNORMAL LOW (ref 8.9–10.3)
GFR calc non Af Amer: >60 mL/min (ref >60)
GLUCOSE: 209 mg/dL — AB (ref 65–99)
Potassium: 4.1 mmol/L (ref 3.5–5.1)
Sodium: 135 mmol/L (ref 135–145)

## 2015-06-29 LAB — CULTURE, RESPIRATORY W GRAM STAIN
Culture: NORMAL
Special Requests: NORMAL

## 2015-06-29 LAB — CULTURE, RESPIRATORY

## 2015-06-29 MED ORDER — FENTANYL CITRATE (PF) 100 MCG/2ML IJ SOLN
50.0000 ug | INTRAMUSCULAR | Status: DC | PRN
  Administered 2015-06-29 – 2015-06-30 (×3): 50 ug via INTRAVENOUS
  Filled 2015-06-29 (×3): qty 2

## 2015-06-29 MED ORDER — FENTANYL BOLUS VIA INFUSION
50.0000 ug | INTRAVENOUS | Status: DC | PRN
  Filled 2015-06-29 (×2): qty 50

## 2015-06-29 MED ORDER — SENNOSIDES-DOCUSATE SODIUM 8.6-50 MG PO TABS
1.0000 | ORAL_TABLET | Freq: Two times a day (BID) | ORAL | Status: DC
  Administered 2015-06-29 – 2015-07-01 (×5): 1 via ORAL
  Filled 2015-06-29 (×6): qty 1

## 2015-06-29 NOTE — Progress Notes (Signed)
Dr. Mortimer Fries aware that precedex was restarted for agitation.  Dr. Mortimer Fries gave order for fentanyl 33mg IV push PRN pain/agitaion q1H.

## 2015-06-29 NOTE — Care Management Important Message (Signed)
Important Message  Patient Details  Name: Aaron Hendrix MRN: [id number] Date of Birth: 02/12/1930   Medicare Important Message Given:  Yes-second notification given    Jolly Mango, RN 06/29/2015, 1:08 PM

## 2015-06-29 NOTE — Progress Notes (Signed)
[place] at [place] NAME: Aaron Hendrix    MR#:  [id number]  DATE OF BIRTH:  1930/08/16  SUBJECTIVE:   pt intubated and on the vent day 2. now on and precedex. Much calm, follows commands, tolerating TF Wife and daughter at bedside REVIEW OF SYSTEMS:  Review of systems unobtainable DRUG ALLERGIES:   Allergies  Allergen Reactions  . Fexofenadine Other (See Comments)  . Quinapril Hcl Other (See Comments)    Unknown   . Latex Rash    VITALS:  Blood pressure 137/73, pulse 68, temperature 99 F (37.2 C), temperature source Rectal, resp. rate 14, height '5\' 7"'$  (1.702 m), weight 68.9 kg (151 lb 14.4 oz), SpO2 100 %.  PHYSICAL EXAMINATION:  GENERAL:  81 y.o.-year-old patient lying in the bed with no acute distress. Critically ill and on the vent EYES: Pupils equal, round, reactive to light and accommodation. No scleral icterus.  HEENT: Head atraumatic, normocephalic.  NECK:  Supple, no jugular venous distention. No thyroid enlargement, no tenderness.  LUNGS: Shallow respirations with a decreased breath sounds bilaterally, minimal wheezing, no rales,rhonchi or crepitation. No use of accessory muscles of respiration.  CARDIOVASCULAR: S1, S2 normal. No murmurs, rubs, or gallops.tachycardia+ ABDOMEN: Soft, nontender, nondistended. Bowel sounds present. No organomegaly or mass.  EXTREMITIES: No pedal edema, cyanosis, or clubbing.  NEUROLOGIC: on the vent PSYCHIATRIC: on the vent SKIN: No obvious rash, lesion, or ulcer.   LABORATORY PANEL:   CBC  Recent Labs Lab 06/29/15 0406  WBC 12.0*  HGB 12.1*  HCT 37.8*  PLT 257   ------------------------------------------------------------------------------------------------------------------  Chemistries   Recent Labs Lab 06/24/15 0944  06/27/15 0512  06/29/15 0406  NA 123*  < > 124*  < > 135  K 4.3  < > 3.7  < > 4.1  CL 79*  < > 84*  < > 99*  CO2 38*  < > 33*  < > 35*   GLUCOSE 118*  < > 257*  < > 209*  BUN 11  < > 24*  < > 32*  CREATININE 0.78  < > 1.32*  < > 0.78  CALCIUM 9.3  < > 8.1*  < > 8.2*  MG  --   --  1.3*  --   --   AST 23  --   --   --   --   ALT 12  --   --   --   --   ALKPHOS 75  --   --   --   --   BILITOT 0.6  --   --   --   --   < > = values in this interval not displayed. ------------------------------------------------------------------------------------------------------------------  Cardiac Enzymes  Recent Labs Lab 06/26/15 1831  TROPONINI 0.06*   ------------------------------------------------------------------------------------------------------------------  RADIOLOGY:  Dg Chest 1 View  06/29/2015   CLINICAL DATA:  On ventilator, shortness of breath.  EXAM: CHEST 1 VIEW  COMPARISON:  06/28/2015 and CT chest 06/25/2015.  FINDINGS: Endotracheal tube terminates approximately 3 cm above the carina. Left IJ central line tip projects over the upper SVC. Nasogastric tube terminates in the stomach.  Heart size normal. Mild bibasilar atelectasis. Nodular lesion in the right perihilar region, as on 06/25/2015. No pleural fluid.  IMPRESSION: 1. Right upper lobe nodule, as on 06/25/2015. Follow-up CT chest without contrast is recommended in 3-4 weeks in further evaluation, as malignancy cannot be excluded. 2. Mild bibasilar subsegmental atelectasis.   Electronically Signed  By: Lorin Picket M.D.   On: 06/29/2015 07:53   Dg Chest 1 View  06/28/2015   CLINICAL DATA:  Hypoxia  EXAM: CHEST 1 VIEW  COMPARISON:  June 26, 2015 chest radiograph and chest CT June 25, 2015  FINDINGS: Endotracheal tube tip is 3.4 cm above the carina. Central catheter tip is in the superior vena cava. Nasogastric tube tip and side port are in the stomach. No pneumothorax. No edema or consolidation. The known nodular lesion in the right upper lobe seen on CT is not appreciable by radiography. Heart size and pulmonary vascularity are normal. No adenopathy.   IMPRESSION: Tube and catheter positions as described without pneumothorax. No edema or consolidation. Right upper lobe nodular lesions seen on recent CT is not appreciable on this examination.   Electronically Signed   By: Lowella Grip III M.D.   On: 06/28/2015 08:00    EKG:   Orders placed or performed during the hospital encounter of 06/25/15  . EKG 12-Lead  . EKG 12-Lead  . ED EKG  . ED EKG    ASSESSMENT AND PLAN:   [name] is a 81 y.o. male with a known history of COPD, irritable bowel syndrome, hypertension, mild dementia presents from home secondary to altered mental status.  #1 altered mental status-likely secondary to acute hypoxic respiratory failure,sepsis and hyponatremia. - CT head normal -ABG has revealed acute respiratory acidosis with hypercapnia---.shows improvement with ABG -Meets septic criteria with leukocytosis, altered mental status and chest x-ray with infiltrate/mass -BC negatvie -sepsis now ruled out  #2 acute respiratory failure with acute respiratory acidosis and hypercapnia secondary to pneumonia and COPD exacerbation -Initial ABG  revealed pH 6.99 and PCO2 at 160 -gas improved after intubation. Vent wean per Pulmonary. Pt did not tolerate wean today  #3 right upper lobe spiculated lung mass with infiltrate Oncology is consulted-appreciated Dr pandit's note Provide broad-spectrum antibiotics with azithromycin -vanc d/ced  #4 sepsis secondary to right upper lobe pneumonia -on IV  Azithromycin -BC negative -Tracheal aspirate pending   #5 acute exacerbation of COPD Patient was given IV Solu-Medrol 125 mg and will continue daily prednisone Continue IV antibiotics and nebulizer treatments  #6 hyponatremia could be from SIADH secondary to problem #3 IV 3% NS (10/1) now d/ced Monitor sodium closely Sodium is slightly better from 118--> 124--->134 Nephrology consult noted CT head negative  #7 History of hypothyroidism Continue  levothyroxin  #8 Nutrition Tolerating TF  All the records are reviewed and case discussed with Care Management/Social Worker. D/w wife and daughter  in the waiting room  CODE STATUS: Full code  TOTAL critical care TIME TAKING CARE OF THIS PATIENT: 40 minutes. More than 50% time was spent in coordination of care, discussing with family  POSSIBLE D/C IN ?  DAYS, DEPENDING ON CLINICAL CONDITION.   Taiden Raybourn M.D on 06/29/2015   Between 7am to 6pm - Pager - [phone] After 6pm go to [email or web address] - password [email or web address] [facility]  Office  [phone]  CC: Primary care physician; [name], MD

## 2015-06-29 NOTE — Progress Notes (Signed)
Inpatient Diabetes Program Recommendations  AACE/ADA: New Consensus Statement on Inpatient Glycemic Control (2015)  Target Ranges:  Prepandial:   less than 140 mg/dL      Peak postprandial:   less than 180 mg/dL (1-2 hours)      Critically ill patients:  140 - 180 mg/dL   Review of Glycemic Control Results for Aaron Hendrix, Aaron Hendrix (MRN [id number]) as of 06/29/2015 08:51  Ref. Range 06/27/2015 11:20 06/27/2015 17:21 06/28/2015 00:04 06/28/2015 06:11 06/28/2015 11:13 06/28/2015 17:45 06/28/2015 23:46 06/29/2015 05:48  Glucose-Capillary Latest Ref Range: 65-99 mg/dL 279 (H) 192 (H) 223 (H) 221 (H) 290 (H) 301 (H) 233 (H) 191 (H)    Diabetes history: Type 2, A1C 6.1% on 06/26/15 Outpatient Diabetes medications: none Current orders for Inpatient glycemic control: Novolog 0-9 units q6h  Inpatient Diabetes Program Recommendations: Elevated blood sugars likely as a result of steroids and feeds.    Add Novolog 3 units q4h while he is on tube feeds (hold if feedings are held).   Continue Novolog correction insulin (SSI) but since he is on steroids, consider increasing it to moderate correction scale (0-15 units) and change it to q4h.   Gentry Fitz, RN, BA, MHA, CDE Diabetes Coordinator Inpatient Diabetes Program  [phone] (Team Pager) [phone] ([place]) 06/29/2015 9:02 AM

## 2015-06-29 NOTE — Progress Notes (Signed)
Nutrition Follow-up    INTERVENTION:   EN: recommend continuing current TF regimen   NUTRITION DIAGNOSIS:   Inadequate oral intake related to acute illness as evidenced by NPO status.  GOAL:   Provide needs based on ASPEN/SCCM guidelines  MONITOR:    (Energy Intake, Anthropometrics, Electrolyte/Renal Profile, Glucose Profile, Digestive system)  REASON FOR ASSESSMENT:   Ventilator    ASSESSMENT:    Pt remains on vent, failed vent wean this AM  Diet Order:  Diet NPO time specified   EN: tolerating Vital High Protein at rate of 60 ml/hr, free water flushes with meds, tube maintenance  Electrolyte and Renal Profile:  Recent Labs Lab 06/27/15 0512  06/27/15 2330 06/28/15 0451 06/29/15 0406  BUN 24*  --   --  27* 32*  CREATININE 1.32*  --   --  1.06 0.78  NA 124*  < > 132* 134*  134* 135  K 3.7  --   --  3.9 4.1  MG 1.3*  --   --   --   --   PHOS 2.3*  --   --   --   --   < > = values in this interval not displayed. Glucose Profile:  Recent Labs  06/28/15 2346 06/29/15 0548 06/29/15 1121  GLUCAP 233* 191* 286*   Meds: ss novolog, prednisone  Skin:  Reviewed, no issues  Last BM:  9/28  Height:   Ht Readings from Last 1 Encounters:  06/25/15 '5\' 7"'$  (1.702 m)    Weight:   Wt Readings from Last 1 Encounters:  06/29/15 151 lb 14.4 oz (68.9 kg)   Filed Weights   06/27/15 0500 06/28/15 0500 06/29/15 0453  Weight: 151 lb 0.2 oz (68.5 kg) 151 lb 0.2 oz (68.5 kg) 151 lb 14.4 oz (68.9 kg)    BMI:  Body mass index is 23.78 kg/(m^2).  Estimated Nutritional Needs:   Kcal:  1467 kcals (BEE 1314, Ve; 7.7, Tmax: 37) using current wt of 66 kg  Protein:  99-132 g (1.5-2.0 g/kg)   Fluid:  1650-1950 mL (25-20 ml/kg)   HIGH Care Level  [name] MS, RD, LDN [phone] Pager

## 2015-06-29 NOTE — Progress Notes (Signed)
Patient following commands and placed in spontaneous mode on vent. Dr. Mortimer Fries present. Weaning trial in progress.

## 2015-06-29 NOTE — Consult Note (Signed)
PULMONARY / CRITICAL CARE MEDICINE   Name: Aaron Hendrix MRN: [id number] DOB: 11/26/1929    ADMISSION DATE:  06/25/2015 The patient was admitted on September 29 with acute unresponsiveness. He was subsequently intubated. 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 This is thought to be secondary to a possible lung cancer as the patient has a lung nodule. His weaning has been complicated by hypercapnia due to hypoventilation. The patient was admitted with unresponsiveness  ASSESSMENT/PLAN   81 yo AAM with acute resp failure Likely from acute COPD exacerbation from pneumonia and encephalopathy  PULMONARY ETT - 8.0 A: Respiratory failure, hypoxic and hypercapnic, acute Possible RUL PNA RUL nodule. Hypercapnic respiratory failure, with hypoventilation, this appears to be chronic as the patient CO2 levels appear to be chronically elevated, going back to May 2015. Patient with probable underlying COPD Possible acute bronchitis, doubt pneumonia. P:   - cont with MV, wean as tolerated.  - RUL spiculated lesion, concerned for malignancy - can be worked up after this acute episode is over - RUL lesion could be driving hyponatremia, SIADH like picture. - lesion will require biopsy in the future.  -plan for SAT/SBT today  CARDIOVASCULAR CVL - LIG Hypotension. Tachycardia, made worse by weaning, possibly due to anxiety and agitation. P:  - Resolved. -We'll use when necessary meds for tachycardia.  RENAL A:  Hyponatremia , now improved after receiving 3% hypertonic saline. P:   - could be the result RUL lesion or RUL PNA -nephrology following  GASTROINTESTINAL Hx of IBS No acute issues at this time. We'll continue H2 blocker.  HEMATOLOGIC A:  RUL spiculated lung lesion - 1.5cm P:  - workup in the future, with biopsy (possibly ENB bronchoscopy when the patient is stable.)  INFECTIOUS A:  Suspect acute bronchitis. P:   BCx2 9/29 Trach Asp  9/30>> showed strep pneumo Abx: vanc/zosyn 9/30, discontinued. Azithro 9/29>>9/30    NEUROLOGIC -follows commands -will assess for extubation Case discussed with the critical care nurse  CHIEF COMPLAINT:   Unresponsiveness, resp failure   HISTORY OF PRESENT ILLNESS  Patient continues to be on the ventilator, he is sedated but arousable. -family at bedside, not on vasopressors, fio2 down to 30% Plan for SAT/SBt today Patient was very agitated this morning  SIGNIFICANT EVENTS  9/29>>admitted for low Na 9/30>>unresponsive, elevated co2, respiratory failure>>intubate   Review of Systems  Unable to perform ROS: critical illness      VITAL SIGNS    Temp:  [97.5 F (36.4 C)-100.4 F (38 C)] 100.2 F (37.9 C) (10/03 1000) Pulse Rate:  [70-105] 77 (10/03 1000) Resp:  [11-30] 15 (10/03 1000) BP: (111-160)/(59-135) 142/70 mmHg (10/03 1000) SpO2:  [98 %-100 %] 100 % (10/03 1000) FiO2 (%):  [30 %-35 %] 30 % (10/03 0802) Weight:  [151 lb 14.4 oz (68.9 kg)] 151 lb 14.4 oz (68.9 kg) (10/03 0453) HEMODYNAMICS: CVP:  [8 mmHg-12 mmHg] 10 mmHg VENTILATOR SETTINGS: Vent Mode:  [-] PRVC FiO2 (%):  [30 %-35 %] 30 % Set Rate:  [12 bmp] 12 bmp Vt Set:  [430 mL] 430 mL PEEP:  [5 cmH20] 5 cmH20 INTAKE / OUTPUT:  Intake/Output Summary (Last 24 hours) at 06/29/15 1055 Last data filed at 06/29/15 1046  Gross per 24 hour  Intake   2013 ml  Output   1485 ml  Net    528 ml       PHYSICAL EXAM   Physical Exam  Eyes: EOM are normal.  Pupils are equal, round, and reactive to light. Right eye exhibits no discharge.  Neck: Normal range of motion.  Cardiovascular: Regular rhythm, normal heart sounds and intact distal pulses.   Pulmonary/Chest: He has no wheezes. He has no rales.  Respiratory failure.  Dec bs bilaterally, no wheezes, no rales.   Abdominal: Soft. Bowel sounds are normal.  Musculoskeletal: He exhibits no edema.  Neurological:  gcs10T  Skin: Skin is warm and dry.   Nursing note and vitals reviewed.      LABS   LABS:  CBC  Recent Labs Lab 06/27/15 0512 06/28/15 0451 06/29/15 0406  WBC 10.8* 13.0* 12.0*  HGB 12.4* 12.5* 12.1*  HCT 37.5* 38.0* 37.8*  PLT 275 265 257   Coag's No results for input(s): APTT, INR in the last 168 hours. BMET  Recent Labs Lab 06/27/15 0512  06/27/15 2330 06/28/15 0451 06/29/15 0406  NA 124*  < > 132* 134*  134* 135  K 3.7  --   --  3.9 4.1  CL 84*  --   --  94* 99*  CO2 33*  --   --  33* 35*  BUN 24*  --   --  27* 32*  CREATININE 1.32*  --   --  1.06 0.78  GLUCOSE 257*  --   --  265* 209*  < > = values in this interval not displayed. Electrolytes  Recent Labs Lab 06/27/15 0512 06/28/15 0451 06/29/15 0406  CALCIUM 8.1* 8.5* 8.2*  MG 1.3*  --   --   PHOS 2.3*  --   --    Sepsis Markers No results for input(s): LATICACIDVEN, PROCALCITON, O2SATVEN in the last 168 hours. ABG  Recent Labs Lab 06/28/15 0432 06/28/15 1200 06/29/15 0407  PHART 7.31* 7.24* 7.33*  PCO2ART 68* 73* 67*  PO2ART 133* 102 115*   Liver Enzymes  Recent Labs Lab 06/24/15 0944  AST 23  ALT 12  ALKPHOS 75  BILITOT 0.6  ALBUMIN 4.0   Cardiac Enzymes  Recent Labs Lab 06/25/15 1426 06/26/15 1831  TROPONINI <0.03 0.06*   Glucose  Recent Labs Lab 06/28/15 0004 06/28/15 0611 06/28/15 1113 06/28/15 1745 06/28/15 2346 06/29/15 0548  GLUCAP 223* 221* 290* 301* 233* 191*     Recent Results (from the past 240 hour(s))  Blood culture (routine x 2)     Status: None (Preliminary result)   Collection Time: 06/25/15  5:19 PM  Result Value Ref Range Status   Specimen Description BLOOD RIGHT ARM  Final   Special Requests BAA,AER5ML,ANA5ML  Final   Culture NO GROWTH 4 DAYS  Final   Report Status PENDING  Incomplete  Blood culture (routine x 2)     Status: None (Preliminary result)   Collection Time: 06/25/15  5:57 PM  Result Value Ref Range Status   Specimen Description BLOOD RIGHT HAND  Final    Special Requests BAA, ANA5ML,AER5ML  Final   Culture NO GROWTH 4 DAYS  Final   Report Status PENDING  Incomplete  MRSA PCR Screening     Status: None   Collection Time: 06/26/15  9:50 AM  Result Value Ref Range Status   MRSA by PCR NEGATIVE NEGATIVE Final    Comment:        The GeneXpert MRSA Assay (FDA approved for NASAL specimens only), is one component of a comprehensive MRSA colonization surveillance program. It is not intended to diagnose MRSA infection nor to guide or monitor treatment for MRSA infections.   Culture,  respiratory (NON-Expectorated)     Status: None   Collection Time: 06/26/15 12:26 PM  Result Value Ref Range Status   Specimen Description TRACHEAL ASPIRATE  Final   Special Requests Normal  Final   Gram Stain   Final    FAIR SPECIMEN - 70-80% WBCS FEW WBC SEEN MODERATE GRAM POSITIVE COCCI RARE YEAST    Culture APPEARS TO BE NORMAL FLORA  Final   Report Status 06/29/2015 FINAL  Final      IMAGING    Dg Chest 1 View  06/29/2015   CLINICAL DATA:  On ventilator, shortness of breath.  EXAM: CHEST 1 VIEW  COMPARISON:  06/28/2015 and CT chest 06/25/2015.  FINDINGS: Endotracheal tube terminates approximately 3 cm above the carina. Left IJ central line tip projects over the upper SVC. Nasogastric tube terminates in the stomach.  Heart size normal. Mild bibasilar atelectasis. Nodular lesion in the right perihilar region, as on 06/25/2015. No pleural fluid.  IMPRESSION: 1. Right upper lobe nodule, as on 06/25/2015. Follow-up CT chest without contrast is recommended in 3-4 weeks in further evaluation, as malignancy cannot be excluded. 2. Mild bibasilar subsegmental atelectasis.   Electronically Signed   By: Lorin Picket M.D.   On: 06/29/2015 07:53      Indwelling Urinary Catheter continued, requirement due to   Reason to continue Indwelling Urinary Catheter for strict Intake/Output monitoring for hemodynamic instability   Central Line continued, requirement  due to   Reason to continue [employer] Monitoring of central venous pressure or other hemodynamic parameters   Ventilator continued, requirement due to, resp failure    Ventilator Sedation RASS 0 to -2    I have personally obtained a history, examined the patient, evaluated Pertinent laboratory and RadioGraph[employer]/imaging results, and  formulated the assessment and plan   The Patient requires high complexity decision making for assessment and support, frequent evaluation and titration of therapies, application of advanced monitoring technologies and extensive interpretation of multiple databases. Critical Care Time devoted to patient care services described in this note is 35 minutes.   Overall, patient is critically ill, prognosis is guarded.     Corrin Parker, M.D.  [facility]  Medical Director [place] Director [place] Department        06/29/2015, 10:55 AM

## 2015-06-29 NOTE — Progress Notes (Signed)
MEDICATION RELATED CONSULT NOTE - INITIAL    Pharmacy Consult for Constipation prevention Indication: Constipation prevention   Allergies  Allergen Reactions  . Fexofenadine Other (See Comments)  . Quinapril Hcl Other (See Comments)    Unknown   . Latex Rash    Patient Measurements: Height: '5\' 7"'$  (170.2 cm) Weight: 151 lb 14.4 oz (68.9 kg) IBW/kg (Calculated) : 66.1 Adjusted Body Weight:   Vital Signs: Temp: 99.5 F (37.5 C) (10/03 1400) Temp Source: Rectal (10/03 0700) BP: 116/59 mmHg (10/03 1400) Pulse Rate: 71 (10/03 1400) Intake/Output from previous day: 10/02 0701 - 10/03 0700 In: 1364.4 [I.V.:1304.4; NG/GT:60] Out: 1485 [Urine:1300; Emesis/NG output:185] Intake/Output from this shift: Total I/O In: 921.2 [I.V.:171.2; NG/GT:500; IV Piggyback:250] Out: 250 [Urine:250]  Labs:  Recent Labs  06/27/15 0512 06/28/15 0451 06/29/15 0406  WBC 10.8* 13.0* 12.0*  HGB 12.4* 12.5* 12.1*  HCT 37.5* 38.0* 37.8*  PLT 275 265 257  CREATININE 1.32* 1.06 0.78  MG 1.3*  --   --   PHOS 2.3*  --   --    Estimated Creatinine Clearance: 66.6 mL/min (by C-G formula based on Cr of 0.78).   Microbiology: Recent Results (from the past 720 hour(s))  Blood culture (routine x 2)     Status: None (Preliminary result)   Collection Time: 06/25/15  5:19 PM  Result Value Ref Range Status   Specimen Description BLOOD RIGHT ARM  Final   Special Requests BAA,AER5ML,ANA5ML  Final   Culture NO GROWTH 4 DAYS  Final   Report Status PENDING  Incomplete  Blood culture (routine x 2)     Status: None (Preliminary result)   Collection Time: 06/25/15  5:57 PM  Result Value Ref Range Status   Specimen Description BLOOD RIGHT HAND  Final   Special Requests BAA, ANA5ML,AER5ML  Final   Culture NO GROWTH 4 DAYS  Final   Report Status PENDING  Incomplete  MRSA PCR Screening     Status: None   Collection Time: 06/26/15  9:50 AM  Result Value Ref Range Status   MRSA by PCR NEGATIVE NEGATIVE Final     Comment:        The GeneXpert MRSA Assay (FDA approved for NASAL specimens only), is one component of a comprehensive MRSA colonization surveillance program. It is not intended to diagnose MRSA infection nor to guide or monitor treatment for MRSA infections.   Culture, respiratory (NON-Expectorated)     Status: None   Collection Time: 06/26/15 12:26 PM  Result Value Ref Range Status   Specimen Description TRACHEAL ASPIRATE  Final   Special Requests Normal  Final   Gram Stain   Final    FAIR SPECIMEN - 70-80% WBCS FEW WBC SEEN MODERATE GRAM POSITIVE COCCI RARE YEAST    Culture APPEARS TO BE NORMAL FLORA  Final   Report Status 06/29/2015 FINAL  Final    Medical History: Past Medical History  Diagnosis Date  . COPD with asthma   . Diabetes mellitus     Diet control   . Hyperlipidemia   . Carpal tunnel syndrome   . DJD (degenerative joint disease), cervical   . Vitamin D deficiency   . Tremor, essential   . Internal hemorrhoids   . Personal history of colonic polyps     adenomatous  . IBS (irritable bowel syndrome)   . Peptic ulcer   . Duodenal ulcer, with partial obstruction 08/10/2012  . Numbness and tingling in right hand     started 2  yeas ago  . Hypertension     no medicine needed  . Memory deficit 12/16/2013  . Essential and other specified forms of tremor 12/16/2013  . Polyneuropathy in diabetes(357.2)     Medications:  Scheduled:  . antiseptic oral rinse  7 mL Mouth Rinse QID  . azithromycin  500 mg Intravenous Q24H  . chlorhexidine gluconate  15 mL Mouth Rinse BID  . enoxaparin (LOVENOX) injection  40 mg Subcutaneous Q24H  . famotidine  20 mg Oral Daily  . feeding supplement (VITAL HIGH PROTEIN)  1,000 mL Per Tube Q24H  . insulin aspart  0-9 Units Subcutaneous Q6H  . ipratropium-albuterol  3 mL Nebulization Q6H  . levothyroxine  50 mcg Oral QAC breakfast  . metoprolol tartrate  25 mg Per Tube BID  . predniSONE  30 mg Oral Q breakfast  . sodium  chloride  3 mL Intravenous Q12H    Assessment: Patient currently on ventilator failed weaning off vent. Pharmacy consulted to start constipation prevention protocol   Goal of Therapy:  Prevention of constipation  Plan:  Will start docusate/senna 1 tablet PO BID. Pharmacy will follow.   Shiane Wenberg D 06/29/2015,2:26 PM

## 2015-06-29 NOTE — Progress Notes (Signed)
Tolerating spontaneous breathing trial although blood gas results were not good therefore per Dr. Zoila Shutter order patient has been placed back on a rate control on vent and patient becoming aggitated and frustrated therefore precedex restarted, safety mittens in place. Wife and daughter at bedside.

## 2015-06-29 NOTE — Progress Notes (Signed)
   06/29/15 1000  Clinical Encounter Type  Visited With Patient and family together  Visit Type Follow-up;Spiritual support  Consult/Referral To Chaplain  Spiritual Encounters  Spiritual Needs Emotional  Chaplain rounded and checked in with family to see how they were doing and to offer a compassionate presence. Chaplain Dawud Mays A. [employer] Ext. [phone]

## 2015-06-30 DIAGNOSIS — I10 Essential (primary) hypertension: Secondary | ICD-10-CM

## 2015-06-30 LAB — BLOOD GAS, ARTERIAL
Acid-Base Excess: 7.3 mmol/L — ABNORMAL HIGH (ref 0.0–3.0)
Allens test (pass/fail): POSITIVE — AB
BICARBONATE: 36.3 meq/L — AB (ref 21.0–28.0)
FIO2: 0.28
O2 SAT: 96.7 %
PCO2 ART: 72 mmHg — AB (ref 32.0–48.0)
PEEP: 5 cmH2O
PH ART: 7.31 — AB (ref 7.350–7.450)
PO2 ART: 95 mmHg (ref 83.0–108.0)
PRESSURE SUPPORT: 5 cmH2O
Patient temperature: 37

## 2015-06-30 LAB — GLUCOSE, CAPILLARY
GLUCOSE-CAPILLARY: 202 mg/dL — AB (ref 65–99)
Glucose-Capillary: 158 mg/dL — ABNORMAL HIGH (ref 65–99)
Glucose-Capillary: 256 mg/dL — ABNORMAL HIGH (ref 65–99)
Glucose-Capillary: 222 mg/dL — ABNORMAL HIGH (ref 65–99)

## 2015-06-30 MED ORDER — MIDAZOLAM HCL 2 MG/2ML IJ SOLN
1.0000 mg | Freq: Once | INTRAMUSCULAR | Status: AC
  Administered 2015-06-30: 1 mg via INTRAVENOUS
  Filled 2015-06-30: qty 2

## 2015-06-30 MED ORDER — MIDAZOLAM HCL 2 MG/2ML IJ SOLN
1.0000 mg | INTRAMUSCULAR | Status: DC | PRN
  Administered 2015-06-30 (×3): 1 mg via INTRAVENOUS
  Filled 2015-06-30 (×3): qty 2

## 2015-06-30 NOTE — Progress Notes (Signed)
Resting at this time.  Arouses to voice, calm and follows commands. Denies pain. NSR and VSS. Afebrile. Tolerating ventilator well. Wife at bedside throughout shift.  Aaron Hendrix B

## 2015-06-30 NOTE — Progress Notes (Signed)
Each morning to the [place] [facility] [place] NAME: Aaron Hendrix    MR#:  [id number]  DATE OF BIRTH:  27-Feb-1930  SUBJECTIVE:   pt intubated and on the vent day 3 his tumor was estrogen. now on and precedex. Much calm, follows commands  wife at bedside  REVIEW OF SYSTEMS:  Review of systems unobtainable DRUG ALLERGIES:   Allergies  Allergen Reactions  . Fexofenadine Other (See Comments)  . Quinapril Hcl Other (See Comments)    Unknown   . Latex Rash    VITALS:  Blood pressure 128/71, pulse 78, temperature 97.5 F (36.4 C), temperature source Rectal, resp. rate 12, height 5' 7"  (1.702 m), weight 68.8 kg (151 lb 10.8 oz), SpO2 100 %.  PHYSICAL EXAMINATION:  GENERAL:  81 y.o.-year-old patient lying in the bed with no acute distress. Critically ill and on the vent EYES: Pupils equal, round, reactive to light and accommodation. No scleral icterus.  HEENT: Head atraumatic, normocephalic.  NECK:  Supple, no jugular venous distention. No thyroid enlargement, no tenderness.  LUNGS: Shallow respirations with a decreased breath sounds bilaterally, minimal wheezing, no rales,rhonchi or crepitation. No use of accessory muscles of respiration.  CARDIOVASCULAR: S1, S2 normal. No murmurs, rubs, or gallops.tachycardia+ ABDOMEN: Soft, nontender, nondistended. Bowel sounds present. No organomegaly or mass.  EXTREMITIES: No pedal edema, cyanosis, or clubbing.  NEUROLOGIC: on the vent PSYCHIATRIC: on the vent SKIN: No obvious rash, lesion, or ulcer.   LABORATORY PANEL:   CBC  Recent Labs Lab 06/29/15 0406  WBC 12.0*  HGB 12.1*  HCT 37.8*  PLT 257   ------------------------------------------------------------------------------------------------------------------  Chemistries   Recent Labs Lab 06/24/15 0944  06/27/15 0512  06/29/15 0406  NA 123*  < > 124*  < > 135  K 4.3  < > 3.7  < > 4.1  CL 79*  < > 84*  < > 99*  CO2 38*  < >  33*  < > 35*  GLUCOSE 118*  < > 257*  < > 209*  BUN 11  < > 24*  < > 32*  CREATININE 0.78  < > 1.32*  < > 0.78  CALCIUM 9.3  < > 8.1*  < > 8.2*  MG  --   --  1.3*  --   --   AST 23  --   --   --   --   ALT 12  --   --   --   --   ALKPHOS 75  --   --   --   --   BILITOT 0.6  --   --   --   --   < > = values in this interval not displayed. ------------------------------------------------------------------------------------------------------------------  Cardiac Enzymes  Recent Labs Lab 06/26/15 1831  TROPONINI 0.06*   ------------------------------------------------------------------------------------------------------------------  RADIOLOGY:  Dg Chest 1 View  06/29/2015   CLINICAL DATA:  On ventilator, shortness of breath.  EXAM: CHEST 1 VIEW  COMPARISON:  06/28/2015 and CT chest 06/25/2015.  FINDINGS: Endotracheal tube terminates approximately 3 cm above the carina. Left IJ central line tip projects over the upper SVC. Nasogastric tube terminates in the stomach.  Heart size normal. Mild bibasilar atelectasis. Nodular lesion in the right perihilar region, as on 06/25/2015. No pleural fluid.  IMPRESSION: 1. Right upper lobe nodule, as on 06/25/2015. Follow-up CT chest without contrast is recommended in 3-4 weeks in further evaluation, as malignancy cannot be excluded. 2. Mild bibasilar subsegmental atelectasis.  Electronically Signed   By: Lorin Picket M.D.   On: 06/29/2015 07:53    EKG:   Orders placed or performed during the hospital encounter of 06/25/15  . EKG 12-Lead  . EKG 12-Lead  . ED EKG  . ED EKG    ASSESSMENT AND PLAN:   Aaron Hendrix is a 81 y.o. male with a known history of COPD, irritable bowel syndrome, hypertension, mild dementia presents from home secondary to altered mental status.  #1 altered mental status-likely secondary to acute hypoxic respiratory failure,sepsis and hyponatremia. - CT head normal -ABG has revealed acute respiratory acidosis with  hypercapnia---.shows improvement with ABG -Met septic criteria with leukocytosis, altered mental status and chest x-ray with infiltrate/mass -BC negatvie -sepsis now ruled out  #2 acute respiratory failure with acute respiratory acidosis and hypercapnia secondary to pneumonia and COPD exacerbation -Initial ABG  revealed pH 6.99 and PCO2 at 160 -gas improved after intubation. Vent wean per Pulmonary. On weaning trial today  #3 right upper lobe spiculated lung mass with infiltrate Oncology is consulted-appreciated Dr pandit's note Provide broad-spectrum antibiotics with azithromycin -vanc d/ced  #4 sepsis secondary to right upper lobe pneumonia -on IV  Azithromycin -BC negative -Tracheal aspirate pending   #5 acute exacerbation of COPD Patient was given IV Solu-Medrol 125 mg and will continue daily prednisone Continue IV antibiotics and nebulizer treatments  #6 hyponatremia could be from SIADH secondary to problem #3 IV 3% NS (10/1) now d/ced Monitor sodium closely Sodium is slightly better from 118--> 124--->134 Nephrology consult noted CT head negative  #7 History of hypothyroidism Continue levothyroxin  #8 Nutrition Tolerating TF currently hold due to weaning trial   All the records are reviewed and case discussed with Care Management/Social Worker. D/w wife and daughter  in theroom  CODE STATUS: Full code  TOTAL critical care TIME TAKING CARE OF THIS PATIENT: 40 minutes. More than 50% time was spent in coordination of care, discussing with family  POSSIBLE D/C IN ?  DAYS, DEPENDING ON CLINICAL CONDITION.   Kitti Mcclish M.D on 06/30/2015   Between 7am to 6pm - Pager - [phone] After 6pm go to [email or web address] - password [email or web address] [facility]  Office  [phone]  CC: Primary care physician; [name], MD

## 2015-06-30 NOTE — Progress Notes (Signed)
Palliative Medicine Inpatient Consult Follow Up Note   Name: Aaron Hendrix Date: 06/30/2015 MRN: [id number]  DOB: 02/21/1930  Referring Physician: Fritzi Mandes, MD  Palliative Care consult requested for this 81 y.o. male for goals of medical therapy in patient with acute respiratory failure.   IMPRESSION: Acute Hypercarbic Respiratory Failure ---pt unresponsive this am and ABG showed severe hypercarbia and respiratory acidosis (pCO2 162 and pH 6.99) ---pt now intubated and ventilated and sedated ---He likely has been hypercarbic for sometime and has had chronic respiratory failure of some degree ---Pt quit going to his 'asthma doctor' a few years ago and just quit using his inhalers on his own in the last few years also Bronchitis  --Pneumonia is not felt to be likely by critical care Newly found lung mass ---35m spiculated mass RUL which will need biopsy COPD (with Asthma) ---With Acute Bronchitis Former Smoker ----Smoked for only about 10 yrs and quit about 50 yrs ago Irritable Bowel Syndrome Essential HTN Mild Dementia Hyponatremia (123 at adm and baseline is 137) ---lung mass is possibly the cause ?  Decreased oral intake Vit D deficiency PUD and h/o Duodenal Ulcer with h/o partial obstruction in 2013 Essential tremor (possibly other from of tremor also) Polyneuropathy due to DM2 DJD Dyslipidemia Hypothyroidism GERD Recent skin lesion removed Soda addiction (drinks a lot of Dr PSamson Fredericdaily)  TODAY'S DISCUSSIONS, DECISIONS, AND PLANS: 1.  Pt has failed spont. Breathing trials for several days (due to hypoventilation and increasing CO2).  Dr. RAlphonsus Siasdiscussed this with wife bedside and with daughter by phone.  I also spoke about this with wife's sister, who brings wife here in her wheelchair.  The issues are whether to reintubate if he fails extubation  And if they would want this, he might need a long -term wean with a trache being needed also.    2.  Pts  wife is not really able to make serious decisions--based on what I have seen and heard Friday and again today.  Sheis very loving and kind --but she has some signs that lead me to believe she is not going to be able to grasp all the issues she would need to be aware of in order to make important decisions about her husband (she  repeats everything I say to her and she forgets some important facts) .   I feel she has an undiagnosed dementia, and I related as much to the wife's sister.  Wife's sister had told me that she doesn't think she (the wife) is 'getting it' and this led me to confide that I think that more talking won't necessarily fix that problem.  The daughter may wish to defer decision making to her mother, but we are going to need to bring up this particular issue with the daughter as well. Decision making may need to fall more on the daughter if pt cannot make decisions himself once more awake.   3.  We probably need to see if we can ask the pt himself these questions --with family present (especially daughter).  This would be my recommendation.     REVIEW OF SYSTEMS:  Patient is not able to provide ROS as he is intubated and sedated  CODE STATUS: Full code   PAST MEDICAL HISTORY: Past Medical History  Diagnosis Date  . COPD with asthma   . Diabetes mellitus     Diet control   . Hyperlipidemia   . Carpal tunnel syndrome   . DJD (degenerative joint disease),  cervical   . Vitamin D deficiency   . Tremor, essential   . Internal hemorrhoids   . Personal history of colonic polyps     adenomatous  . IBS (irritable bowel syndrome)   . Peptic ulcer   . Duodenal ulcer, with partial obstruction 08/10/2012  . Numbness and tingling in right hand     started 2 yeas ago  . Hypertension     no medicine needed  . Memory deficit 12/16/2013  . Essential and other specified forms of tremor 12/16/2013  . Polyneuropathy in diabetes(357.2)     PAST SURGICAL HISTORY:  Past Surgical History   Procedure Laterality Date  . Anal fissure repair    . Colonoscopy w/ biopsies and polypectomy  8/03, 6/05, 7/09, 9/10    internal hemorrhoids, tubular adenomas, mucosa & lymphoid nodules  . Upper gastrointestinal endoscopy  3/05, 7/09, 9/10,2013    gastritis, duodenitis  . Total hip arthroplasty Right 11/13/2012    Procedure: TOTAL HIP ARTHROPLASTY ANTERIOR APPROACH;  Surgeon: Mcarthur Rossetti, MD;  Location: [place];  Service: Orthopedics;  Laterality: Right;  Right total hip arthroplasty  . Ankle surgery Right     right- pins placed in    Vital Signs: BP 120/62 mmHg  Pulse 67  Temp(Src) 98.1 F (36.7 C) (Rectal)  Resp 14  Ht '5\' 7"'$  (1.702 m)  Wt 68.8 kg (151 lb 10.8 oz)  BMI 23.75 kg/m2  SpO2 100% Filed Weights   06/28/15 0500 06/29/15 0453 06/30/15 0500  Weight: 68.5 kg (151 lb 0.2 oz) 68.9 kg (151 lb 14.4 oz) 68.8 kg (151 lb 10.8 oz)    Estimated body mass index is 23.75 kg/(m^2) as calculated from the following:   Height as of this encounter: '5\' 7"'$  (1.702 m).   Weight as of this encounter: 68.8 kg (151 lb 10.8 oz).  PHYSICAL EXAM: Intubated and sedated Eyes closed Neck w/o JVD or TM Hrt rrr no mgr Lungs with vent sounds --no wheezing is heard Abd soft and NT Ext no mottling or cyanosis  LABS: CBC:    Component Value Date/Time   WBC 12.0* 06/29/2015 0406   WBC 12.8* 06/03/2014 0406   HGB 12.1* 06/29/2015 0406   HGB 13.5 06/03/2014 0406   HCT 37.8* 06/29/2015 0406   HCT 42.2 06/03/2014 0406   PLT 257 06/29/2015 0406   PLT 228 06/03/2014 0406   MCV 94.8 06/29/2015 0406   MCV 97 06/03/2014 0406   NEUTROABS 5.0 10/20/2014 1009   NEUTROABS 10.5* 06/03/2014 0406   LYMPHSABS 1.8 10/20/2014 1009   LYMPHSABS 0.6* 06/03/2014 0406   MONOABS 0.5 10/20/2014 1009   MONOABS 1.7* 06/03/2014 0406   EOSABS 0.4 10/20/2014 1009   EOSABS 0.0 06/03/2014 0406   BASOSABS 0.0 10/20/2014 1009   BASOSABS 0.0 06/03/2014 0406   Comprehensive Metabolic Panel:    Component  Value Date/Time   NA 135 06/29/2015 0406   NA 132* 06/03/2014 0406   K 4.1 06/29/2015 0406   K 4.6 06/03/2014 0406   CL 99* 06/29/2015 0406   CL 96* 06/03/2014 0406   CO2 35* 06/29/2015 0406   CO2 30 06/03/2014 0406   BUN 32* 06/29/2015 0406   BUN 29* 06/03/2014 0406   CREATININE 0.78 06/29/2015 0406   CREATININE 0.84 02/06/2015 1549   CREATININE 0.98 06/03/2014 0406   GLUCOSE 209* 06/29/2015 0406   GLUCOSE 87 06/03/2014 0406   CALCIUM 8.2* 06/29/2015 0406   CALCIUM 8.7 06/03/2014 0406   AST 23 06/24/2015 0944  AST 43* 06/01/2014 2202   ALT 12 06/24/2015 0944   ALT 24 06/01/2014 2202   ALKPHOS 75 06/24/2015 0944   ALKPHOS 82 06/01/2014 2202   BILITOT 0.6 06/24/2015 0944   BILITOT 0.7 06/01/2014 2202   PROT 6.8 06/24/2015 0944   PROT 8.1 06/01/2014 2202   ALBUMIN 4.0 06/24/2015 0944   ALBUMIN 4.1 06/01/2014 2202     More than 50% of the visit was spent in counseling/coordination of care: YES  Time Spent:  45 min

## 2015-06-30 NOTE — Progress Notes (Signed)
Inpatient Diabetes Program Recommendations  AACE/ADA: New Consensus Statement on Inpatient Glycemic Control (2015)  Target Ranges:  Prepandial:   less than 140 mg/dL      Peak postprandial:   less than 180 mg/dL (1-2 hours)      Critically ill patients:  140 - 180 mg/dL   Review of Glycemic Control:  Results for [name], Aaron Hendrix (MRN [id number]) as of 06/30/2015 14:30  Ref. Range 06/29/2015 11:21 06/29/2015 18:27 06/30/2015 00:50 06/30/2015 06:11 06/30/2015 12:45  Glucose-Capillary Latest Ref Range: 65-99 mg/dL 286 (H) 210 (H) 202 (H) 158 (H) 256 (H)     Inpatient Diabetes Program Recommendations:    Note that CBG's continue to be elevated.  May consider increasing Novolog correction to moderate q 4 hours (instead of q 6 hours).    Thanks, Adah Perl, RN, BC-ADM Inpatient Diabetes Coordinator Pager [phone] (8a-5p)

## 2015-06-30 NOTE — Progress Notes (Signed)
Patient agitated trying to get out of bed, flailing arms in bed waving to passers by.  Follows commands when asked by RN and calmed down with encouragement from RN. Precedex infusing for agitation. RN called Angie, RT and asked to start vent wean.  Angie came to room and assessed patient and started wean. Tube feeds stopped for wean. Continuing to monitor.  Sheenah Dimitroff B

## 2015-06-30 NOTE — Progress Notes (Signed)
Physician Signed Oncology Consult Note 06/26/2015 4:57 PM    Expand All Collapse All   ONCOLOGY CONSULTATION NOTE -  Reason for Consultation: Newly diagnosed right upper lobe spiculated lung nodule.       HISTORY OF PRESENT ILLNESS:  HISTORY OF PRESENT ILLNESS: Patient currently on mechanical ventilation, sedated. History obtained from chart record and discussion with patient's wife at bedside. Aaron Hendrix is a 81 -year-old gentleman with past medical history significant for chronic obstructive pulmonary disease, hypertension, irritable bowel syndrome, mild dementia who has been admitted to the hospital yesterday with altered mental status. He was found to have hyponatremia with serum sodium of 122. Patient has been sleeping mostly for the past week, oral intake also has been poor. Since admission to hospital, patient's condition worsened today and he reportedly was obtunded and had high pCO2 of 162 and has been intubated by pulmonologist Dr.Mungal for respiratory failure. CT scan of the head was negative for acute intracranial abnormality. CT scan of the chest reported somewhat spiculated mass lesion in the right upper lobe with some associated right upper lobe infiltrative changes raising possibility of neoplasm versus other etiology. Patient's wife present at bedside states that he has not had known cancer in the past.   PAST MEDICAL HISTORY:   Past Medical History  Diagnosis Date  . COPD with asthma   . Diabetes mellitus     Diet control   . Hyperlipidemia   . Carpal tunnel syndrome   . DJD (degenerative joint disease), cervical   . Vitamin D deficiency   . Tremor, essential   . Internal hemorrhoids   . Personal history of colonic polyps     adenomatous  . IBS (irritable bowel syndrome)   . Peptic ulcer   . Duodenal ulcer, with partial obstruction 08/10/2012  .  Numbness and tingling in right hand     started 2 yeas ago  . Hypertension     no medicine needed  . Memory deficit 12/16/2013  . Essential and other specified forms of tremor 12/16/2013  . Polyneuropathy in diabetes(357.2)     PAST SURGICAL HISTORY:   Past Surgical History  Procedure Laterality Date  . Anal fissure repair    . Colonoscopy w/ biopsies and polypectomy  8/03, 6/05, 7/09, 9/10    internal hemorrhoids, tubular adenomas, mucosa & lymphoid nodules  . Upper gastrointestinal endoscopy  3/05, 7/09, 9/10,2013    gastritis, duodenitis  . Total hip arthroplasty Right 11/13/2012    Procedure: TOTAL HIP ARTHROPLASTY ANTERIOR APPROACH; Surgeon: Mcarthur Rossetti, MD; Location: [place]; Service: Orthopedics; Laterality: Right; Right total hip arthroplasty  . Ankle surgery Right     right- pins placed in    SOCIAL HISTORY:   Social History  Substance Use Topics  . Smoking status: Former Smoker    Types: Cigarettes    Quit date: 07/02/1963  . Smokeless tobacco: Never Used  . Alcohol Use: 0.0 oz/week    0 Standard drinks or equivalent per week     Comment: occasional    FAMILY HISTORY:   Family History  Problem Relation Age of Onset  . Colon cancer Neg Hx   . Esophageal cancer Neg Hx   . Rectal cancer Neg Hx   . Stomach cancer Neg Hx   . Leukemia Maternal Aunt   . Thyroid cancer Daughter 15  . Diabetes Son     DRUG ALLERGIES:   Allergies  Allergen Reactions  . Fexofenadine Other (See Comments)  . Quinapril Hcl  Other (See Comments)    Unknown   . Latex Rash    REVIEW OF SYSTEMS:   Review of Systems  Unable to obtain at this time. Patient sedated, on mechanical  ventilation.  MEDICATIONS AT HOME:   Prior to Admission medications   Medication Sig      acetaminophen (TYLENOL) 500 MG tablet Take 500 mg by mouth every 6 (six) hours as needed.      Docusate Calcium (STOOL SOFTENER PO) Take by mouth.      ipratropium (ATROVENT) 0.06 % nasal spray Place 2 sprays into the nose daily as needed.       levothyroxine (SYNTHROID, LEVOTHROID) 50 MCG tablet Take 50 mcg by mouth daily before breakfast.      loratadine (CLARITIN) 10 MG tablet Take 10 mg by mouth daily.      pantoprazole (PROTONIX) 40 MG tablet TAKE 1 TABLET BY MOUTH ONCE DAILY, 30-60MINUTES BEFORE A MEAL.      saccharomyces boulardii (FLORASTOR) 250 MG capsule Take 250 mg by mouth daily.      traMADol (ULTRAM) 50 MG tablet Take by mouth at bedtime as needed.        PHYSICAL EXAM: VITALS: 100.9, 73, 18, 125/58, 100% on mechanical ventilation. GENERAL: Patient is unresponsive, poorly nourished individual, in no acute distress. No icterus or pallor.  HEENT: normocephalic, atraumatic. No cervical adenopathy. CVS: D6-Q2, regular, systolic murmur heard.  LUNGS: Bilateral diminished breath sounds, no rhonchi or crepitations.  ABDOMEN: Soft, nontender, no organomegaly clinically.  EXTREMITIES: No pedal edema or cyanosis.  NEURO: Patient obtunded, sedated on mechanical ventilation.  SKIN: No obvious rash or major bruising.   LABORATORY PANEL:   CBC  Last Labs     Recent Labs Lab 06/25/15 1426  WBC 10.2  HGB 13.7  HCT 42.1  PLT 284     ------------------------------------------------------------------------------------------------------------------  Chemistries   Last Labs     Recent Labs Lab 06/24/15 0944  06/25/15 1426  NA 123* < > 118*  K 4.3 --  4.2  CL 79* --  77*  CO2 38* --  31  GLUCOSE 118* --  237*   BUN 11 --  12  CREATININE 0.78 --  0.85  CALCIUM 9.3 --  8.3*  AST 23 --  --   ALT 12 --  --   ALKPHOS 75 --  --   BILITOT 0.6 --  --   < > = values in this interval not displayed.   ------------------------------------------------------------------------------------------------------------------  Cardiac Enzymes  Last Labs     Recent Labs Lab 06/25/15 1426  TROPONINI <0.03     ------------------------------------------------------------------------------------------------------------------ RADIOLOGY: RADIOLOGY:      Dg Chest 2 View 06/25/2015 CLINICAL DATA: Hyponatremia. Weakness. EXAM: CHEST 2 VIEW COMPARISON: 06/01/2014 and 04/25/2012 FINDINGS: There is a vague nodular appearing density in the right upper lobe between the anterior aspects of the right second and third ribs. There is adjacent oxygen 2 being. This could that be artifactual. Lungs are otherwise clear. Heart size and vascularity are normal. Interposed bowel under the right hemidiaphragm, unchanged. No acute osseous abnormality. IMPRESSION: Vague density in the right upper lobe. This may be artifactual. I recommend repeat AP or PA chest x-ray prior to discharge. Electronically Signed By: Lorriane Shire M.D. On: 06/25/2015 16:27   Ct Head Wo Contrast 06/25/2015 CLINICAL DATA: Altered mental status. Weakness. Hyponatremia. EXAM: CT HEAD WITHOUT CONTRAST TECHNIQUE: Contiguous axial images were obtained from the base of the skull through the vertex without intravenous contrast. COMPARISON: 12/02/2008 head  CT. 12/21/2013 brain MRI. FINDINGS: No evidence of parenchymal hemorrhage or extra-axial fluid collection. No mass lesion, mass effect, or midline shift. No CT evidence of acute infarction. Stable generalized cerebral volume loss. Cerebral ventricle size is are stable and concordant with the degree of cerebral  volume loss. Atherosclerotic intracranial arteries. Nonspecific subcortical and periventricular white matter hypodensity, most in keeping with chronic small vessel ischemic change. The visualized paranasal sinuses are essentially clear. The mastoid air cells are unopacified. No evidence of calvarial fracture. IMPRESSION: 1. No acute intracranial abnormality. 2. Generalized cerebral volume loss and chronic small vessel ischemic change. Electronically Signed By: Ilona Sorrel M.D. On: 06/25/2015 17:52   Ct Chest W Contrast 06/25/2015 CLINICAL DATA: Abnormal chest x-ray EXAM: CT CHEST WITH CONTRAST TECHNIQUE: Multidetector CT imaging of the chest was performed during intravenous contrast administration. CONTRAST: 66m OMNIPAQUE IOHEXOL 300 MG/ML SOLN COMPARISON: Plain film from earlier in the same day FINDINGS: The left lung is well aerated without focal infiltrate or sizable effusion. The right lung is also well aerated but demonstrates patchy changes in the posterior aspect of the right upper lobe. Some of these the appearance of early infiltrate although a more organized 15 mm mildly spiculated lesion is noted in the upper lobe best seen on image number 19 of series 3. No other focal lesions are noted. The thoracic inlet is within normal limits. Calcifications are noted in the thoracic aorta and its branches although no aneurysmal dilatation or dissection is seen. Heavy coronary calcifications are noted particularly in the left anterior descending coronary artery. The pulmonary artery demonstrates a normal branching pattern without definitive filling defect. No significant lymphadenopathy is seen. Scanning into the upper abdomen reveals no acute abnormality. A left renal cyst is noted. The osseous structures are within normal limits. IMPRESSION: Somewhat spiculated mass lesion in the right upper lobe with some associated right upper lobe infiltrative changes. This must be viewed with a degree  of suspicion for underlying neoplasm. Continued workup is recommended. This lesion would be amenable to percutaneous biopsy if clinically indicated. Electronically Signed By: MInez CatalinaM.D. On: 06/25/2015 17:51     EKG:   Orders placed or performed during the hospital encounter of 06/25/15  . EKG 12-Lead  . EKG 12-Lead  . ED EKG  . ED EKG    IMPRESSION AND PLAN:  IMPRESSION / RECOMMENDATIONS: 81year old gentleman with a history of COPD.  Patient was admitted in the hospital with acute respiratory failure. Further evaluation with CT scans shows a spiculated mass I had prolonged discussion with patient and family today and that further workup may be necessary including later on PET scan and biopsy but will have to wait to patient comes off ventilator. Family understood that options we will follow this patient regularly and once the patient come off ventilator possibility of PET scan would be considered

## 2015-06-30 NOTE — Progress Notes (Signed)
MEDICATION RELATED CONSULT NOTE - FOLLOW UP   Pharmacy Consult for Constipation prevention Indication: Constipation prevention   Allergies  Allergen Reactions  . Fexofenadine Other (See Comments)  . Quinapril Hcl Other (See Comments)    Unknown   . Latex Rash    Patient Measurements: Height: '5\' 7"'$  (170.2 cm) Weight: 151 lb 10.8 oz (68.8 kg) IBW/kg (Calculated) : 66.1 Adjusted Body Weight:   Vital Signs: Temp: 98.1 F (36.7 C) (10/04 1300) BP: 131/69 mmHg (10/04 1300) Pulse Rate: 72 (10/04 1300) Intake/Output from previous day: 10/03 0701 - 10/04 0700 In: 2030.2 [I.V.:260.2; NG/GT:1520; IV Piggyback:250] Out: 1375 [KGURK:2706] Intake/Output from this shift: Total I/O In: 352.6 [I.V.:102.6; IV Piggyback:250] Out: -   Labs:  Recent Labs  06/28/15 0451 06/29/15 0406  WBC 13.0* 12.0*  HGB 12.5* 12.1*  HCT 38.0* 37.8*  PLT 265 257  CREATININE 1.06 0.78   Estimated Creatinine Clearance: 66.6 mL/min (by C-G formula based on Cr of 0.78).   Microbiology: Recent Results (from the past 720 hour(s))  Blood culture (routine x 2)     Status: None (Preliminary result)   Collection Time: 06/25/15  5:19 PM  Result Value Ref Range Status   Specimen Description BLOOD RIGHT ARM  Final   Special Requests BAA,[id number],ANA5ML  Final   Culture NO GROWTH 4 DAYS  Final   Report Status PENDING  Incomplete  Blood culture (routine x 2)     Status: None (Preliminary result)   Collection Time: 06/25/15  5:57 PM  Result Value Ref Range Status   Specimen Description BLOOD RIGHT HAND  Final   Special Requests BAA, [id number],[id number]  Final   Culture NO GROWTH 4 DAYS  Final   Report Status PENDING  Incomplete  MRSA PCR Screening     Status: None   Collection Time: 06/26/15  9:50 AM  Result Value Ref Range Status   MRSA by PCR NEGATIVE NEGATIVE Final    Comment:        The GeneXpert MRSA Assay (FDA approved for NASAL specimens only), is one component of a comprehensive MRSA  colonization surveillance program. It is not intended to diagnose MRSA infection nor to guide or monitor treatment for MRSA infections.   Culture, respiratory (NON-Expectorated)     Status: None   Collection Time: 06/26/15 12:26 PM  Result Value Ref Range Status   Specimen Description TRACHEAL ASPIRATE  Final   Special Requests Normal  Final   Gram Stain   Final    FAIR SPECIMEN - 70-80% WBCS FEW WBC SEEN MODERATE GRAM POSITIVE COCCI RARE YEAST    Culture APPEARS TO BE NORMAL FLORA  Final   Report Status 06/29/2015 FINAL  Final    Medical History: Past Medical History  Diagnosis Date  . COPD with asthma   . Diabetes mellitus     Diet control   . Hyperlipidemia   . Carpal tunnel syndrome   . DJD (degenerative joint disease), cervical   . Vitamin D deficiency   . Tremor, essential   . Internal hemorrhoids   . Personal history of colonic polyps     adenomatous  . IBS (irritable bowel syndrome)   . Peptic ulcer   . Duodenal ulcer, with partial obstruction 08/10/2012  . Numbness and tingling in right hand     started 2 yeas ago  . Hypertension     no medicine needed  . Memory deficit 12/16/2013  . Essential and other specified forms of tremor 12/16/2013  . Polyneuropathy in  diabetes(357.2)     Medications:  Scheduled:  . antiseptic oral rinse  7 mL Mouth Rinse QID  . azithromycin  500 mg Intravenous Q24H  . chlorhexidine gluconate  15 mL Mouth Rinse BID  . enoxaparin (LOVENOX) injection  40 mg Subcutaneous Q24H  . famotidine  20 mg Oral Daily  . feeding supplement (VITAL HIGH PROTEIN)  1,000 mL Per Tube Q24H  . insulin aspart  0-9 Units Subcutaneous Q6H  . ipratropium-albuterol  3 mL Nebulization Q6H  . levothyroxine  50 mcg Oral QAC breakfast  . metoprolol tartrate  25 mg Per Tube BID  . predniSONE  30 mg Oral Q breakfast  . senna-docusate  1 tablet Oral BID  . sodium chloride  3 mL Intravenous Q12H    Assessment: Patient currently on ventilator failed  weaning off vent. Pharmacy consulted to start constipation prevention protocol   Goal of Therapy:  Prevention of constipation  Plan:  Will continue docusate/senna 1 tablet PO BID. Pharmacy will follow.   Aaron Hendrix D 06/30/2015,1:20 PM

## 2015-06-30 NOTE — Progress Notes (Signed)
RN spoke with Dr. Ashby Dawes regarding patient's sedation and what to give when patient gets agitated. MD gave order to discontinue PRN fentanyl and to order versed '1mg'$  IV push q2H PRN for agitation.

## 2015-06-30 NOTE — Progress Notes (Signed)
[facility]ess Note    ASSESSMENT/PLAN    The patient was admitted on September 29 with acute unresponsiveness. He was subsequently intubated. 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 This is thought to be secondary to a possible lung cancer as the patient has a lung nodule. His weaning has been complicated by hypercapnia due to hypoventilation.  PULMONARY ETT - 8.0 A: -Respiratory failure, hypoxic and hypercapnic, acute -Possible RUL PNA, on azithromycin -RUL nodule; possible primary lung cancer. We'll consider ENB, rhonchi, endoscopy when clinically stable. -Hypercapnic respiratory failure, with hypoventilation, this appears to be chronic  going back to May 2015. Suspect this is secondary to chronic hypoventilation from COPD, though neurological disease could also be a cause. -Patient with probable underlying COPD -Possible acute bronchitis, doubt pneumonia. -The patient has failed his spontaneous breathing trials for several days in a row due to hypoventilation and increasing CO2 levels. I discussed the case with the patient's critical care RN. The patient's wife at bedside and his daughter by telephone. He may require a long-term wean or it could be that he cannot be weaned off the ventilator.  Therefore, I would recommend that we perform a trial extubation after first deciding whether the patient would be reintubated should he fail. Should he require reintubation, the patient would need a tracheostomy and would need to undergo a long-term wean from the ventilator. Given the possibility of underlying lung cancer as well as his other conditions, I'm not sure that this would be desirable. The family is going to discuss these options and get back to [place], possibly tomorrow.  CARDIOVASCULAR CVL - LIG Hypotension. Tachycardia, made worse by weaning, possibly due to anxiety and agitation. P:  - Resolved. -We'll use when  necessary meds for tachycardia.  RENAL A:  Hyponatremia , now resolved after receiving 3% hypertonic saline. P:  - could be the result RUL lesion or RUL PNA -nephrology following  GASTROINTESTINAL Hx of IBS No acute issues at this time. We'll continue H2 blocker.  HEMATOLOGIC A: RUL spiculated lung lesion - 1.5cm P:  - workup in the future, with biopsy (possibly ENB bronchoscopy when the patient is stable.)   INFECTIOUS A: Suspect acute bronchitis. P:  BCx2 9/29 Trach Asp 9/30>> showed strep pneumo Abx: vanc/zosyn 9/30, discontinued. Azithro 9/29>>    NEUROLOGIC -follows commands   INDWELLING DEVICES:: Left internal jugular CVC: 4 days Urethral catheter: 4 days NG tube: 4 days.     ---------------------------------------   ----------------------------------------   Name: Aaron Hendrix MRN: [id number] DOB: August 16, 1930    ADMISSION DATE:  06/25/2015    CHIEF COMPLAINT:  Dyspnea     SUBJECTIVE:     Review of Systems:  Pt currently on the ventilator, can not provide history or review of systems.    VITAL SIGNS: Temp:  [97.3 F (36.3 C)-99.5 F (37.5 C)] 97.9 F (36.6 C) (10/04 1200) Pulse Rate:  [51-120] 77 (10/04 1200) Resp:  [12-26] 13 (10/04 1200) BP: (72-171)/(59-84) 129/69 mmHg (10/04 1200) SpO2:  [90 %-100 %] 100 % (10/04 1200) FiO2 (%):  [28 %] 28 % (10/04 1207) Weight:  [68.8 kg (151 lb 10.8 oz)] 68.8 kg (151 lb 10.8 oz) (10/04 0500) HEMODYNAMICS: CVP:  [8 mmHg-10 mmHg] 8 mmHg VENTILATOR SETTINGS: Vent Mode:  [-] PRVC FiO2 (%):  [28 %] 28 % Set Rate:  [14 bmp] 14 bmp Vt Set:  [430 mL] 430 mL PEEP:  [5 cmH20] 5 cmH20 INTAKE /  OUTPUT:  Intake/Output Summary (Last 24 hours) at 06/30/15 1223 Last data filed at 06/30/15 1200  Gross per 24 hour  Intake 1588.53 ml  Output   1375 ml  Net 213.53 ml    PHYSICAL EXAMINATION: Physical Examination:   VS: BP 129/69 mmHg  Pulse 77  Temp(Src) 97.9 F (36.6 C) (Rectal)   Resp 13  Ht '5\' 7"'$  (1.702 m)  Wt 68.8 kg (151 lb 10.8 oz)  BMI 23.75 kg/m2  SpO2 100%  General Appearance: No distress  Neuro:without focal findings, mental status normal. HEENT: PERRLA, EOM intact. Pulmonary: normal breath sounds   CardiovascularNormal S1,S2.  No m/r/g.   Abdomen: Benign, Soft, non-tender. Renal:  No costovertebral tenderness  GU:  Not performed at this time. Endocrine: No evident thyromegaly. Skin:   warm, no rashes, no ecchymosis  Extremities: normal, no cyanosis, clubbing.   LABS:   LABORATORY PANEL:   CBC  Recent Labs Lab 06/29/15 0406  WBC 12.0*  HGB 12.1*  HCT 37.8*  PLT 257    Chemistries   Recent Labs Lab 06/24/15 0944  06/27/15 0512  06/29/15 0406  NA 123*  < > 124*  < > 135  K 4.3  < > 3.7  < > 4.1  CL 79*  < > 84*  < > 99*  CO2 38*  < > 33*  < > 35*  GLUCOSE 118*  < > 257*  < > 209*  BUN 11  < > 24*  < > 32*  CREATININE 0.78  < > 1.32*  < > 0.78  CALCIUM 9.3  < > 8.1*  < > 8.2*  MG  --   --  1.3*  --   --   PHOS  --   --  2.3*  --   --   AST 23  --   --   --   --   ALT 12  --   --   --   --   ALKPHOS 75  --   --   --   --   BILITOT 0.6  --   --   --   --   < > = values in this interval not displayed.   Recent Labs Lab 06/28/15 2346 06/29/15 0548 06/29/15 1121 06/29/15 1827 06/30/15 0050 06/30/15 0611  GLUCAP 233* 191* 286* 210* 202* 158*    Recent Labs Lab 06/29/15 0407 06/29/15 1115 06/30/15 1100  PHART 7.33* 7.26* 7.31*  PCO2ART 67* 76* 72*  PO2ART 115* 106 95    Recent Labs Lab 06/24/15 0944  AST 23  ALT 12  ALKPHOS 75  BILITOT 0.6  ALBUMIN 4.0    Cardiac Enzymes  Recent Labs Lab 06/26/15 1831  TROPONINI 0.06*    RADIOLOGY:  Dg Chest 1 View  06/29/2015   CLINICAL DATA:  On ventilator, shortness of breath.  EXAM: CHEST 1 VIEW  COMPARISON:  06/28/2015 and CT chest 06/25/2015.  FINDINGS: Endotracheal tube terminates approximately 3 cm above the carina. Left IJ central line tip projects  over the upper SVC. Nasogastric tube terminates in the stomach.  Heart size normal. Mild bibasilar atelectasis. Nodular lesion in the right perihilar region, as on 06/25/2015. No pleural fluid.  IMPRESSION: 1. Right upper lobe nodule, as on 06/25/2015. Follow-up CT chest without contrast is recommended in 3-4 weeks in further evaluation, as malignancy cannot be excluded. 2. Mild bibasilar subsegmental atelectasis.   Electronically Signed   By: Lorin Picket M.D.   On: 06/29/2015 07:53       --  Marda Stalker, MD.  Pager [phone] [facility] Office Number: [phone]  Patricia Pesa, M.D.  Vilinda Boehringer, M.D.  Merton Border, M.D  [place].  I have personally obtained a history, examined the patient, evaluated laboratory and imaging results, formulated the assessment and plan and placed orders. The Patient requires high complexity decision making for assessment and support, frequent evaluation and titration of therapies, application of advanced monitoring technologies and extensive interpretation of multiple databases. The patient has critical illness that could lead imminently to failure of 1 or more organ systems and requires the highest level of physician preparedness to intervene.  Critical Care Time devoted to patient care services described in this note is 35 minutes and is exclusive of time spent in procedures.

## 2015-06-30 NOTE — Progress Notes (Signed)
Nutrition Follow-up  INTERVENTION:   EN: recommend continuing TF at goal rate as per order set if unable to extubate today   NUTRITION DIAGNOSIS:   Inadequate oral intake related to acute illness as evidenced by NPO status. Being addressed via TF  GOAL:   Provide needs based on ASPEN/SCCM guidelines  MONITOR:    (Energy Intake, Anthropometrics, Electrolyte/Renal Profile, Glucose Profile, Digestive system)  REASON FOR ASSESSMENT:   Ventilator    ASSESSMENT:    Pt remains on vent, possible extubation  Diet Order:  Diet NPO time specified   EN: TF on hold this AM for vent wean, previously tolerating Vital High Protein at goal rate  Skin:  Reviewed, no issues  Last BM:  10/2   Electrolyte and Renal Profile:  Recent Labs Lab 06/27/15 0512  06/27/15 2330 06/28/15 0451 06/29/15 0406  BUN 24*  --   --  27* 32*  CREATININE 1.32*  --   --  1.06 0.78  NA 124*  < > 132* 134*  134* 135  K 3.7  --   --  3.9 4.1  MG 1.3*  --   --   --   --   PHOS 2.3*  --   --   --   --   < > = values in this interval not displayed. Glucose Profile:  Recent Labs  06/30/15 0050 06/30/15 0611 06/30/15 1245  GLUCAP 202* 158* 256*   Meds: ss novolog  Height:   Ht Readings from Last 1 Encounters:  06/25/15 '5\' 7"'$  (1.702 m)    Weight:   Wt Readings from Last 1 Encounters:  06/30/15 151 lb 10.8 oz (68.8 kg)    Ideal Body Weight:     BMI:  Body mass index is 23.75 kg/(m^2).  Estimated Nutritional Needs:   Kcal:  1467 kcals (BEE 1314, Ve; 7.7, Tmax: 37) using current wt of 66 kg  Protein:  99-132 g (1.5-2.0 g/kg)   Fluid:  1650-1950 mL (25-20 ml/kg)   HIGH Care Level  [name] MS, RD, LDN [phone] Pager

## 2015-07-01 ENCOUNTER — Inpatient Hospital Stay: Payer: Medicare Other

## 2015-07-01 DIAGNOSIS — J9612 Chronic respiratory failure with hypercapnia: Secondary | ICD-10-CM

## 2015-07-01 LAB — CBC
HCT: 36.7 % — ABNORMAL LOW (ref 40.0–52.0)
Hemoglobin: 11.9 g/dL — ABNORMAL LOW (ref 13.0–18.0)
MCH: 30.6 pg (ref 26.0–34.0)
MCHC: 32.4 g/dL (ref 32.0–36.0)
MCV: 94.3 fL (ref 80.0–100.0)
PLATELETS: 264 10*3/uL (ref 150–440)
RBC: 3.89 MIL/uL — ABNORMAL LOW (ref 4.40–5.90)
RDW: 13.2 % (ref 11.5–14.5)
WBC: 10.7 10*3/uL — ABNORMAL HIGH (ref 3.8–10.6)

## 2015-07-01 LAB — GLUCOSE, CAPILLARY
GLUCOSE-CAPILLARY: 126 mg/dL — AB (ref 65–99)
GLUCOSE-CAPILLARY: 190 mg/dL — AB (ref 65–99)
GLUCOSE-CAPILLARY: 93 mg/dL (ref 65–99)
Glucose-Capillary: 226 mg/dL — ABNORMAL HIGH (ref 65–99)
Glucose-Capillary: 117 mg/dL — ABNORMAL HIGH (ref 65–99)

## 2015-07-01 LAB — BASIC METABOLIC PANEL
Anion gap: 4 — ABNORMAL LOW (ref 5–15)
BUN: 35 mg/dL — AB (ref 6–20)
CHLORIDE: 98 mmol/L — AB (ref 101–111)
CO2: 35 mmol/L — ABNORMAL HIGH (ref 22–32)
CREATININE: 0.7 mg/dL (ref 0.61–1.24)
Calcium: 8.9 mg/dL (ref 8.9–10.3)
GFR calc Af Amer: >60 mL/min (ref >60)
GLUCOSE: 237 mg/dL — AB (ref 65–99)
Potassium: 4.2 mmol/L (ref 3.5–5.1)
Sodium: 137 mmol/L (ref 135–145)

## 2015-07-01 LAB — CULTURE, BLOOD (ROUTINE X 2)
CULTURE: NO GROWTH
CULTURE: NO GROWTH

## 2015-07-01 MED ORDER — SENNOSIDES-DOCUSATE SODIUM 8.6-50 MG PO TABS
1.0000 | ORAL_TABLET | Freq: Once | ORAL | Status: AC
  Administered 2015-07-01: 1 via ORAL
  Filled 2015-07-01: qty 1

## 2015-07-01 MED ORDER — CLONIDINE HCL ER 0.1 MG PO TB12
0.1000 mg | ORAL_TABLET | Freq: Two times a day (BID) | ORAL | Status: DC
  Filled 2015-07-01 (×3): qty 1

## 2015-07-01 MED ORDER — PREDNISONE 10 MG PO TABS: 10.0000 mg | ORAL_TABLET | Freq: Every day | ORAL | Status: DC

## 2015-07-01 MED ORDER — BISACODYL 10 MG RE SUPP
10.0000 mg | Freq: Every day | RECTAL | Status: DC
  Administered 2015-07-01 – 2015-07-02 (×2): 10 mg via RECTAL
  Filled 2015-07-01 (×2): qty 1

## 2015-07-01 MED ORDER — SENNOSIDES-DOCUSATE SODIUM 8.6-50 MG PO TABS
2.0000 | ORAL_TABLET | Freq: Two times a day (BID) | ORAL | Status: DC
  Administered 2015-07-01 – 2015-07-05 (×6): 2 via ORAL
  Filled 2015-07-01 (×7): qty 2

## 2015-07-01 MED ORDER — HYDROCODONE-ACETAMINOPHEN 5-325 MG PO TABS: 1.0000 | ORAL_TABLET | Freq: Four times a day (QID) | ORAL | Status: DC | PRN

## 2015-07-01 MED ORDER — CLONIDINE HCL 0.1 MG PO TABS
0.1000 mg | ORAL_TABLET | Freq: Two times a day (BID) | ORAL | Status: DC
  Administered 2015-07-01 – 2015-07-06 (×10): 0.1 mg via ORAL
  Filled 2015-07-01 (×10): qty 1

## 2015-07-01 NOTE — Progress Notes (Signed)
Patient ST on monitor in low 100's.Pt extubated around 12 today- tolerated well with RT at bedside.  Pt extubated to bipap- then transitioned to 2 liters [place]- Per Dr. Juanell Fairly pt to be placed on bipap at night.  Pt tolerating diet.  Foley in place- urine output adequate.  One time complaint of pain- BP up 419'Q systolic- Dr. Verdell Carmine ordered Clonidine.  Pt resting at this time.

## 2015-07-01 NOTE — Progress Notes (Signed)
[facility] Progess Note    ASSESSMENT/PLAN    The patient was admitted on September 29 with acute unresponsiveness. He was subsequently intubated. 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 This is thought to be secondary to a possible lung cancer as the patient has a lung nodule. His weaning has been complicated by hypercapnia due to hypoventilation.  PULMONARY ETT - 8.0 A: -Respiratory failure, hypoxic and hypercapnic, acute -Possible RUL PNA, on azithromycin -RUL nodule; possible primary lung cancer. We'll consider ENB, bronchoscopy  when clinically stable. Oncology has been consulted -Hypercapnic respiratory failure, with hypoventilation, this appears to be chronic  going back to May 2015. Suspect this is secondary to chronic hypoventilation from COPD, though neurological disease could also be a cause. -Patient with probable underlying COPD -Possible acute bronchitis, doubt pneumonia.  -The patient has been on azithromycin for possible acute bronchitis. Has completed a 5 day course. We'll discontinue today -The patient has failed his spontaneous breathing trials for several days in a row due to hypoventilation and increasing CO2 levels. I discussed the case with the patient's critical care RN, patient's wife at bedside and daughter.Marland Kitchen He may require a long-term wean or it could be that he cannot be weaned off the ventilator. We will perform a weaning trial, followed by a trial extubation today. The patient is awake and alert, even though he is on a ventilator at this time. I did ask him whether he would want to be reintubated. He should fail extubation, and I asked him whether he would be okay with having a tracheostomy. He appears to understand my questions and notes that he would want reintubation or tracheostomy if necessary.  CARDIOVASCULAR CVL - LIG  P:  - Resolved. -We'll use when necessary meds for  tachycardia.  RENAL A:  Hyponatremia due to SIADH , now resolved after receiving 3% hypertonic saline. P:  - could be the result RUL lesion or RUL PNA   GASTROINTESTINAL Hx of IBS No acute issues at this time. We'll continue H2 blocker.  HEMATOLOGIC A: RUL spiculated lung lesion - 1.5cm P:  - workup in the future, with biopsy (possibly ENB bronchoscopy when the patient is stable.) -Oncology consult noted.  INFECTIOUS A: Suspect acute bronchitis. P:  BCx2 9/29 Trach Asp 9/30>> showed strep pneumo Abx: vanc/zosyn 9/30, discontinued. Azithro 9/29>> 07/01/2015   NEUROLOGIC -follows commands   INDWELLING DEVICES:: Left internal jugular CVC: 06/26/2015 Urethral catheter: 06/26/2015 NG tube: 06/26/2015 Intubated. 06/26/2015    ---------------------------------------   ----------------------------------------   Name: Aaron Hendrix MRN: [id number] DOB: 1930-02-23    ADMISSION DATE:  06/25/2015    CHIEF COMPLAINT:  Dyspnea   SUBJECTIVE:  Pt currently on the ventilator, can not provide history or review of systems.   Review of Systems:  Pt currently on the ventilator, can not provide history or review of systems.    VITAL SIGNS: Temp:  [97.3 F (36.3 C)-100.6 F (38.1 C)] 99.7 F (37.6 C) (10/05 0800) Pulse Rate:  [67-87] 72 (10/05 0800) Resp:  [12-18] 17 (10/05 0800) BP: (111-187)/(53-89) 111/55 mmHg (10/05 0800) SpO2:  [98 %-100 %] 99 % (10/05 0800) FiO2 (%):  [28 %] 28 % (10/05 0824) Weight:  [69.8 kg (153 lb 14.1 oz)] 69.8 kg (153 lb 14.1 oz) (10/05 0500) HEMODYNAMICS: CVP:  [9 mmHg-15 mmHg] 11 mmHg VENTILATOR SETTINGS: Vent Mode:  [-] PRVC FiO2 (%):  [28 %] 28 % Set Rate:  [14 bmp] 14 bmp  Vt Set:  [430 mL] 430 mL PEEP:  [5 cmH20] 5 cmH20 INTAKE / OUTPUT:  Intake/Output Summary (Last 24 hours) at 07/01/15 0915 Last data filed at 07/01/15 0800  Gross per 24 hour  Intake 1831.06 ml  Output   1472 ml  Net 359.06 ml     PHYSICAL EXAMINATION: Physical Examination:   VS: BP 111/55 mmHg  Pulse 72  Temp(Src) 99.7 F (37.6 C) (Rectal)  Resp 17  Ht '5\' 7"'$  (1.702 m)  Wt 69.8 kg (153 lb 14.1 oz)  BMI 24.10 kg/m2  SpO2 99%  General Appearance: No distress  Neuro:without focal findings, mental status normal. HEENT: PERRLA, EOM intact. Pulmonary: normal breath sounds   CardiovascularNormal S1,S2.  No m/r/g.   Abdomen: Benign, Soft, non-tender. Renal:  No costovertebral tenderness  GU:  Not performed at this time. Endocrine: No evident thyromegaly. Skin:   warm, no rashes, no ecchymosis  Extremities: normal, no cyanosis, clubbing.   LABS:   LABORATORY PANEL:   CBC  Recent Labs Lab 07/01/15 0500  WBC 10.7*  HGB 11.9*  HCT 36.7*  PLT 264    Chemistries   Recent Labs Lab 06/24/15 0944  06/27/15 0512  07/01/15 0500  NA 123*  < > 124*  < > 137  K 4.3  < > 3.7  < > 4.2  CL 79*  < > 84*  < > 98*  CO2 38*  < > 33*  < > 35*  GLUCOSE 118*  < > 257*  < > 237*  BUN 11  < > 24*  < > 35*  CREATININE 0.78  < > 1.32*  < > 0.70  CALCIUM 9.3  < > 8.1*  < > 8.9  MG  --   --  1.3*  --   --   PHOS  --   --  2.3*  --   --   AST 23  --   --   --   --   ALT 12  --   --   --   --   ALKPHOS 75  --   --   --   --   BILITOT 0.6  --   --   --   --   < > = values in this interval not displayed.   Recent Labs Lab 06/30/15 0050 06/30/15 0611 06/30/15 1245 06/30/15 1757 06/30/15 2353 07/01/15 0621  GLUCAP 202* 158* 256* 222* 226* 190*    Recent Labs Lab 06/29/15 0407 06/29/15 1115 06/30/15 1100  PHART 7.33* 7.26* 7.31*  PCO2ART 67* 76* 72*  PO2ART 115* 106 95    Recent Labs Lab 06/24/15 0944  AST 23  ALT 12  ALKPHOS 75  BILITOT 0.6  ALBUMIN 4.0    Cardiac Enzymes  Recent Labs Lab 06/26/15 1831  TROPONINI 0.06*    RADIOLOGY:  Dg Chest 1 View  07/01/2015   CLINICAL DATA:  Shortness of breath.  EXAM: CHEST 1 VIEW  COMPARISON:  06/29/2015.  CT 06/25/2015  FINDINGS:  Endotracheal tube, NG tube, left IJ line stable position. Heart size normal. Right upper lobe nodular density is difficult to identify due to overlying EKG lead. No new focal pulmonary infiltrate. No pleural effusion or pneumothorax. No acute bony abnormality.  IMPRESSION: 1. Lines and tubes in stable position. 2. Right upper lobe nodular density is difficult to visualize due to overlying EKG lead. No new infiltrate. Reference is made to prior CT report 06/25/2015.   Electronically Signed   By:  [place]   On: 07/01/2015 07:21       --Marda Stalker, MD.  Pager [phone] [facility] Office Number: [phone]  Patricia Pesa, M.D.  Vilinda Boehringer, M.D.  Merton Border, M.D  [place].  I have personally obtained a history, examined the patient, evaluated laboratory and imaging results, formulated the assessment and plan and placed orders. The Patient requires high complexity decision making for assessment and support, frequent evaluation and titration of therapies, application of advanced monitoring technologies and extensive interpretation of multiple databases. The patient has critical illness that could lead imminently to failure of 1 or more organ systems and requires the highest level of physician preparedness to intervene.  Critical Care Time devoted to patient care services described in this note is 50 minutes and is exclusive of time spent in procedures.

## 2015-07-01 NOTE — Progress Notes (Signed)
MEDICATION RELATED CONSULT NOTE - FOLLOW UP   Pharmacy Consult for Constipation prevention Indication: Constipation prevention   Allergies  Allergen Reactions  . Fexofenadine Other (See Comments)  . Quinapril Hcl Other (See Comments)    Unknown   . Latex Rash    Patient Measurements: Height: '5\' 7"'$  (170.2 cm) Weight: 153 lb 14.1 oz (69.8 kg) IBW/kg (Calculated) : 66.1 Adjusted Body Weight:   Vital Signs: Temp: 99.7 F (37.6 C) (10/05 1100) BP: 146/70 mmHg (10/05 1100) Pulse Rate: 83 (10/05 1000) Intake/Output from previous day: 10/04 0701 - 10/05 0700 In: 1865.3 [I.V.:495.3; NG/GT:1120; IV Piggyback:250] Out: 6440 [Urine:1450; Emesis/NG output:22] Intake/Output from this shift:    Labs:  Recent Labs  06/29/15 0406 07/01/15 0500  WBC 12.0* 10.7*  HGB 12.1* 11.9*  HCT 37.8* 36.7*  PLT 257 264  CREATININE 0.78 0.70   Estimated Creatinine Clearance: 66.6 mL/min (by C-G formula based on Cr of 0.7).   Microbiology: Recent Results (from the past 720 hour(s))  Blood culture (routine x 2)     Status: None   Collection Time: 06/25/15  5:19 PM  Result Value Ref Range Status   Specimen Description BLOOD RIGHT ARM  Final   Special Requests BAA,AER5ML,ANA5ML  Final   Culture NO GROWTH 6 DAYS  Final   Report Status 07/01/2015 FINAL  Final  Blood culture (routine x 2)     Status: None   Collection Time: 06/25/15  5:57 PM  Result Value Ref Range Status   Specimen Description BLOOD RIGHT HAND  Final   Special Requests BAA, ANA5ML,AER5ML  Final   Culture NO GROWTH 6 DAYS  Final   Report Status 07/01/2015 FINAL  Final  MRSA PCR Screening     Status: None   Collection Time: 06/26/15  9:50 AM  Result Value Ref Range Status   MRSA by PCR NEGATIVE NEGATIVE Final    Comment:        The GeneXpert MRSA Assay (FDA approved for NASAL specimens only), is one component of a comprehensive MRSA colonization surveillance program. It is not intended to diagnose MRSA infection nor  to guide or monitor treatment for MRSA infections.   Culture, respiratory (NON-Expectorated)     Status: None   Collection Time: 06/26/15 12:26 PM  Result Value Ref Range Status   Specimen Description TRACHEAL ASPIRATE  Final   Special Requests Normal  Final   Gram Stain   Final    FAIR SPECIMEN - 70-80% WBCS FEW WBC SEEN MODERATE GRAM POSITIVE COCCI RARE YEAST    Culture APPEARS TO BE NORMAL FLORA  Final   Report Status 06/29/2015 FINAL  Final    Medical History: Past Medical History  Diagnosis Date  . COPD with asthma   . Diabetes mellitus     Diet control   . Hyperlipidemia   . Carpal tunnel syndrome   . DJD (degenerative joint disease), cervical   . Vitamin D deficiency   . Tremor, essential   . Internal hemorrhoids   . Personal history of colonic polyps     adenomatous  . IBS (irritable bowel syndrome)   . Peptic ulcer   . Duodenal ulcer, with partial obstruction 08/10/2012  . Numbness and tingling in right hand     started 2 yeas ago  . Hypertension     no medicine needed  . Memory deficit 12/16/2013  . Essential and other specified forms of tremor 12/16/2013  . Polyneuropathy in diabetes(357.2)     Medications:  Scheduled:  .  antiseptic oral rinse  7 mL Mouth Rinse QID  . azithromycin  500 mg Intravenous Q24H  . chlorhexidine gluconate  15 mL Mouth Rinse BID  . enoxaparin (LOVENOX) injection  40 mg Subcutaneous Q24H  . famotidine  20 mg Oral Daily  . feeding supplement (VITAL HIGH PROTEIN)  1,000 mL Per Tube Q24H  . insulin aspart  0-9 Units Subcutaneous Q6H  . ipratropium-albuterol  3 mL Nebulization Q6H  . levothyroxine  50 mcg Oral QAC breakfast  . metoprolol tartrate  25 mg Per Tube BID  . [START ON 07/02/2015] predniSONE  10 mg Oral Q breakfast  . senna-docusate  1 tablet Oral Once  . senna-docusate  2 tablet Oral BID  . sodium chloride  3 mL Intravenous Q12H    Assessment: Patient currently on ventilator failed weaning off vent. Pharmacy  consulted to start constipation prevention protocol   Goal of Therapy:  Prevention of constipation  Plan:  Patient still w/o BM so will increase regimen to senna/docusate bid and f/u AM.   Ulice Dash D 07/01/2015,11:38 AM

## 2015-07-01 NOTE — Progress Notes (Signed)
Nutrition Follow-up    INTERVENTION:   Meals and Snacks: Cater to patient preferences Medical Food Supplement Therapy: recommend addition of nutritional supplement once diet advanced  NUTRITION DIAGNOSIS:   Inadequate oral intake related to acute illness as evidenced by NPO status.  GOAL:   Provide needs based on ASPEN/SCCM guidelines  MONITOR:    (Energy Intake, Anthropometrics, Electrolyte/Renal Profile, Glucose Profile, Digestive system)  ASSESSMENT:    Pt s/p extubation this AM, currently on Bipap  Past Medical History  Diagnosis Date  . COPD with asthma   . Diabetes mellitus     Diet control   . Hyperlipidemia   . Carpal tunnel syndrome   . DJD (degenerative joint disease), cervical   . Vitamin D deficiency   . Tremor, essential   . Internal hemorrhoids   . Personal history of colonic polyps     adenomatous  . IBS (irritable bowel syndrome)   . Peptic ulcer   . Duodenal ulcer, with partial obstruction 08/10/2012  . Numbness and tingling in right hand     started 2 yeas ago  . Hypertension     no medicine needed  . Memory deficit 12/16/2013  . Essential and other specified forms of tremor 12/16/2013  . Polyneuropathy in diabetes(357.2)     Diet Order:  Diet NPO time specified, TF held for extubation  Skin:  Reviewed, no issues  Last BM:  10/2   Electrolyte and Renal Profile:  Recent Labs Lab 06/27/15 0512  06/28/15 0451 06/29/15 0406 07/01/15 0500  BUN 24*  --  27* 32* 35*  CREATININE 1.32*  --  1.06 0.78 0.70  NA 124*  < > 134*  134* 135 137  K 3.7  --  3.9 4.1 4.2  MG 1.3*  --   --   --   --   PHOS 2.3*  --   --   --   --   < > = values in this interval not displayed. Glucose Profile:  Recent Labs  06/30/15 2353 07/01/15 0621 07/01/15 1110  GLUCAP 226* 190* 117*   Meds: lasix, ss novolog, senna-docustate  Height:   Ht Readings from Last 1 Encounters:  06/25/15 '5\' 7"'$  (1.702 m)    Weight:   Wt Readings from Last 1 Encounters:   07/01/15 153 lb 14.1 oz (69.8 kg)    Filed Weights   06/29/15 0453 06/30/15 0500 07/01/15 0500  Weight: 151 lb 14.4 oz (68.9 kg) 151 lb 10.8 oz (68.8 kg) 153 lb 14.1 oz (69.8 kg)    BMI:  Body mass index is 24.1 kg/(m^2).  Estimated Nutritional Needs:   Kcal:  1467 kcals (BEE 1314, Ve; 7.7, Tmax: 37) using current wt of 66 kg  Protein:  99-132 g (1.5-2.0 g/kg)   Fluid:  1650-1950 mL (25-20 ml/kg)   HIGH Care Level  [name] MS, RD, LDN [phone] Pager

## 2015-07-01 NOTE — Progress Notes (Signed)
Each morning to the [facility] at [place] NAME: Aaron Hendrix    MR#:  [id number]  DATE OF BIRTH:  Sep 18, 1930  SUBJECTIVE:   pt extubated today. On BIPAP. Trying to have BM. wife at bedside  No respiratory distress REVIEW OF SYSTEMS:  Review of systems unobtainable DRUG ALLERGIES:   Allergies  Allergen Reactions  . Fexofenadine Other (See Comments)  . Quinapril Hcl Other (See Comments)    Unknown   . Latex Rash    VITALS:  Blood pressure 140/108, pulse 118, temperature 100 F (37.8 C), temperature source Rectal, resp. rate 24, height 5' 7" (1.702 m), weight 69.8 kg (153 lb 14.1 oz), SpO2 100 %.  PHYSICAL EXAMINATION:  GENERAL:  81 y.o.-year-old patient lying in the bed with no acute distress. Critically ill _0 EYES: Pupils equal, round, reactive to light and accommodation. No scleral icterus.  HEENT: Head atraumatic, normocephalic.  NECK:  Supple, no jugular venous distention. No thyroid enlargement, no tenderness.  LUNGS: Shallow respirations with a decreased breath sounds bilaterally, minimal wheezing, no rales,rhonchi or crepitation. No use of accessory muscles of respiration.  CARDIOVASCULAR: S1, S2 normal. No murmurs, rubs, or gallops.tachycardia+ ABDOMEN: Soft, nontender, nondistended. Bowel sounds present. No organomegaly or mass.  EXTREMITIES: No pedal edema, cyanosis, or clubbing.  NEUROLOGIC: non focal unable to do detail exam, on BIPAP PSYCHIATRIC: alert and oriented SKIN: No obvious rash, lesion, or ulcer.   LABORATORY PANEL:   CBC  Recent Labs Lab 07/01/15 0500  WBC 10.7*  HGB 11.9*  HCT 36.7*  PLT 264   ------------------------------------------------------------------------------------------------------------------  Chemistries   Recent Labs Lab 06/27/15 0512  07/01/15 0500  NA 124*  < > 137  K 3.7  < > 4.2  CL 84*  < > 98*  CO2 33*  < > 35*  GLUCOSE 257*  < > 237*  BUN 24*  < > 35*   CREATININE 1.32*  < > 0.70  CALCIUM 8.1*  < > 8.9  MG 1.3*  --   --   < > = values in this interval not displayed. ------------------------------------------------------------------------------------------------------------------  Cardiac Enzymes  Recent Labs Lab 06/26/15 1831  TROPONINI 0.06*   ------------------------------------------------------------------------------------------------------------------  RADIOLOGY:  Dg Chest 1 View  07/01/2015   CLINICAL DATA:  Shortness of breath.  EXAM: CHEST 1 VIEW  COMPARISON:  06/29/2015.  CT 06/25/2015  FINDINGS: Endotracheal tube, NG tube, left IJ line stable position. Heart size normal. Right upper lobe nodular density is difficult to identify due to overlying EKG lead. No new focal pulmonary infiltrate. No pleural effusion or pneumothorax. No acute bony abnormality.  IMPRESSION: 1. Lines and tubes in stable position. 2. Right upper lobe nodular density is difficult to visualize due to overlying EKG lead. No new infiltrate. Reference is made to prior CT report 06/25/2015.   Electronically Signed   By: Marcello Moores  Register   On: 07/01/2015 07:21    EKG:   Orders placed or performed during the hospital encounter of 06/25/15  . EKG 12-Lead  . EKG 12-Lead  . ED EKG  . ED EKG    ASSESSMENT AND PLAN:   Aaron Hendrix is a 81 y.o. male with a known history of COPD, irritable bowel syndrome, hypertension, mild dementia presents from home secondary to altered mental status.  #1 altered mental status-likely secondary to acute hypoxic respiratory failure,sepsis and hyponatremia. - CT head normal -ABG has revealed acute respiratory acidosis with hypercapnia---.shows improvement with ABG -Met septic criteria  with leukocytosis, altered mental status and chest x-ray with infiltrate/mass -BC negatvie -sepsis now ruled out  #2 acute respiratory failure with acute respiratory acidosis and hypercapnia secondary to pneumonia and COPD  exacerbation -Initial ABG  revealed pH 6.99 and PCO2 at 160 -gas improved after intubation.---> now extubated (10/5) -BIPAP now and wean as tolerated  #3 right upper lobe spiculated lung mass with infiltrate Oncology is consulted-appreciated Dr Metro Kung note Provide broad-spectrum antibiotics with azithromycin  #4 sepsis secondary to right upper lobe pneumonia -on IV  Azithromycin -BC negative  #5 acute exacerbation of COPD Patient was given IV Solu-Medrol 125 mg and will continue daily prednisone Continue IV antibiotics and nebulizer treatments  #6 hyponatremia could be from SIADH secondary to problem #3 IV 3% NS (10/1) now d/ced Monitor sodium closely Sodium is slightly better from 118--> 124--->134 Nephrology consult noted CT head negative  #7 History of hypothyroidism Continue levothyroxin  #8 Nutrition Start po diet  All the records are reviewed and case discussed with Care Management/Social Worker. D/w wife and daughter  in theroom  CODE STATUS: Full code  TOTAL critical care TIME TAKING CARE OF THIS PATIENT: 40 minutes. More than 50% time was spent in coordination of care, discussing with family  POSSIBLE D/C IN ?  DAYS, DEPENDING ON CLINICAL CONDITION.   , M.D on 07/01/2015   Between 7am to 6pm - Pager - [phone] After 6pm go to [email or web address] - password [email or web address] [facility]  Office  [phone]  CC: Primary care physician; [name], MD

## 2015-07-01 NOTE — Progress Notes (Signed)
   07/01/15 1900  Clinical Encounter Type  Visited With Patient  Visit Type Follow-up  Consult/Referral To Chaplain  Spiritual Encounters  Spiritual Needs Emotional  Stress Factors  Patient Stress Factors None identified  Chaplain rounded in the unit and offered a compassionate presence to the patient. Patient was extubated earlier so I wanted to offer spiritual support. Chaplain Daren Yeagle A. Roe Wilner Ext. [phone]

## 2015-07-01 NOTE — Progress Notes (Signed)
Pt. Extubated to Bipap 09/01/11/30. No resp. Distress noted at this time.

## 2015-07-01 NOTE — Progress Notes (Signed)
Patient passed nursing swallow screen.  Dr. Juanell Fairly aware and ordered clear liquid diet.

## 2015-07-02 ENCOUNTER — Inpatient Hospital Stay: Payer: Medicare Other

## 2015-07-02 DIAGNOSIS — E440 Moderate protein-calorie malnutrition: Secondary | ICD-10-CM | POA: Insufficient documentation

## 2015-07-02 DIAGNOSIS — J9622 Acute and chronic respiratory failure with hypercapnia: Secondary | ICD-10-CM

## 2015-07-02 LAB — CBC
HEMATOCRIT: 40.9 % (ref 40.0–52.0)
HEMOGLOBIN: 12.9 g/dL — AB (ref 13.0–18.0)
MCH: 29.8 pg (ref 26.0–34.0)
MCHC: 31.4 g/dL — ABNORMAL LOW (ref 32.0–36.0)
MCV: 94.8 fL (ref 80.0–100.0)
Platelets: 310 10*3/uL (ref 150–440)
RBC: 4.31 MIL/uL — ABNORMAL LOW (ref 4.40–5.90)
RDW: 13.4 % (ref 11.5–14.5)
WBC: 15.5 10*3/uL — AB (ref 3.8–10.6)

## 2015-07-02 LAB — BASIC METABOLIC PANEL
ANION GAP: 6 (ref 5–15)
BUN: 40 mg/dL — ABNORMAL HIGH (ref 6–20)
CHLORIDE: 97 mmol/L — AB (ref 101–111)
CO2: 37 mmol/L — ABNORMAL HIGH (ref 22–32)
Calcium: 9.1 mg/dL (ref 8.9–10.3)
Creatinine, Ser: 0.87 mg/dL (ref 0.61–1.24)
GFR calc Af Amer: >60 mL/min (ref >60)
GLUCOSE: 144 mg/dL — AB (ref 65–99)
POTASSIUM: 3.7 mmol/L (ref 3.5–5.1)
SODIUM: 140 mmol/L (ref 135–145)

## 2015-07-02 LAB — GLUCOSE, CAPILLARY
GLUCOSE-CAPILLARY: 136 mg/dL — AB (ref 65–99)
GLUCOSE-CAPILLARY: 176 mg/dL — AB (ref 65–99)
GLUCOSE-CAPILLARY: 171 mg/dL — AB (ref 65–99)
Glucose-Capillary: 124 mg/dL — ABNORMAL HIGH (ref 65–99)
Glucose-Capillary: 145 mg/dL — ABNORMAL HIGH (ref 65–99)

## 2015-07-02 MED ORDER — METOPROLOL TARTRATE 25 MG PO TABS
25.0000 mg | ORAL_TABLET | Freq: Two times a day (BID) | ORAL | Status: DC
  Administered 2015-07-02 – 2015-07-06 (×9): 25 mg via ORAL
  Filled 2015-07-02 (×9): qty 1

## 2015-07-02 MED ORDER — FAMOTIDINE 20 MG PO TABS
20.0000 mg | ORAL_TABLET | Freq: Every day | ORAL | Status: DC
  Administered 2015-07-02 – 2015-07-06 (×5): 20 mg via ORAL
  Filled 2015-07-02 (×5): qty 1

## 2015-07-02 MED ORDER — HYDRALAZINE HCL 20 MG/ML IJ SOLN
10.0000 mg | INTRAMUSCULAR | Status: DC | PRN
  Administered 2015-07-02 – 2015-07-04 (×2): 10 mg via INTRAVENOUS
  Filled 2015-07-02 (×2): qty 1

## 2015-07-02 MED ORDER — ENSURE ENLIVE PO LIQD
237.0000 mL | Freq: Two times a day (BID) | ORAL | Status: DC
  Administered 2015-07-02 – 2015-07-06 (×8): 237 mL via ORAL

## 2015-07-02 MED ORDER — RISAQUAD PO CAPS
2.0000 | ORAL_CAPSULE | Freq: Every day | ORAL | Status: DC
  Administered 2015-07-02 – 2015-07-06 (×5): 2 via ORAL
  Filled 2015-07-02 (×5): qty 2

## 2015-07-02 MED ORDER — MOMETASONE FURO-FORMOTEROL FUM 100-5 MCG/ACT IN AERO
2.0000 | INHALATION_SPRAY | Freq: Two times a day (BID) | RESPIRATORY_TRACT | Status: DC
  Administered 2015-07-02 – 2015-07-06 (×8): 2 via RESPIRATORY_TRACT
  Filled 2015-07-02 (×2): qty 8.8

## 2015-07-02 NOTE — Progress Notes (Signed)
Patient moved to room 233 by bed with glen, orderly.  Patient on o2 and 2A tele monitor when leaving ICU that was verified with Evon, tele clerk. No distress noted when leaving ICU. Wife waiting for patient in new room.

## 2015-07-02 NOTE — Progress Notes (Signed)
Each morning to the [facility] at [place] NAME: Aaron Hendrix    MR#:  [id number]  DATE OF BIRTH:  04-08-1930  SUBJECTIVE:   pt  Doing well. Now on 2 liter [place] oxygen No respiratory distress. HAd BM y'day. No abdominal pain.Marland Kitchen tolerating CLD REVIEW OF SYSTEMS:  Review of systems unobtainable DRUG ALLERGIES:   Allergies  Allergen Reactions  . Fexofenadine Other (See Comments)  . Quinapril Hcl Other (See Comments)    Unknown   . Latex Rash    VITALS:  Blood pressure 146/67, pulse 89, temperature 98.4 F (36.9 C), temperature source Oral, resp. rate 23, height 5' 7" (1.702 m), weight 67.9 kg (149 lb 11.1 oz), SpO2 92 %.  PHYSICAL EXAMINATION:  GENERAL:  81 y.o.-year-old patient lying in the bed with no acute distress. Critically ill ,weak EYES: Pupils equal, round, reactive to light and accommodation. No scleral icterus.  HEENT: Head atraumatic, normocephalic.  NECK:  Supple, no jugular venous distention. No thyroid enlargement, no tenderness.  LUNGS: Shallow respirations with a decreased breath sounds bilaterally, minimal wheezing, no rales,rhonchi or crepitation. No use of accessory muscles of respiration.  CARDIOVASCULAR: S1, S2 normal. No murmurs, rubs, or gallops.tachycardia+ ABDOMEN: Soft, nontender, +++distended. Bowel soundshyperactive present. No organomegaly or mass.  EXTREMITIES: No pedal edema, cyanosis, or clubbing.  NEUROLOGIC: non focal , grossly normal. Weak overall PSYCHIATRIC: alert and oriented SKIN: No obvious rash, lesion, or ulcer.   LABORATORY PANEL:   CBC  Recent Labs Lab 07/02/15 0459  WBC 15.5*  HGB 12.9*  HCT 40.9  PLT 310   ------------------------------------------------------------------------------------------------------------------  Chemistries   Recent Labs Lab 06/27/15 0512  07/02/15 0459  NA 124*  < > 140  K 3.7  < > 3.7  CL 84*  < > 97*  CO2 33*  < > 37*  GLUCOSE 257*  <  > 144*  BUN 24*  < > 40*  CREATININE 1.32*  < > 0.87  CALCIUM 8.1*  < > 9.1  MG 1.3*  --   --   < > = values in this interval not displayed. ------------------------------------------------------------------------------------------------------------------  Cardiac Enzymes  Recent Labs Lab 06/26/15 1831  TROPONINI 0.06*   ------------------------------------------------------------------------------------------------------------------  RADIOLOGY:  Dg Chest 1 View  07/02/2015   CLINICAL DATA:  Community-acquired pneumonia, dyspnea, history of COPD -asthma.  EXAM: CHEST 1 VIEW  COMPARISON:  Portable chest x-ray of July 01, 2015  FINDINGS: There is interval extubation of the trachea and of the esophagus. The lungs are borderline hypoinflated. There is considerable gaseous distention of bowel loops under the hemidiaphragm. There is no significant pleural effusion. The cardiac silhouette is normal in size. The central pulmonary vascularity is mildly prominent though stable. The left internal jugular venous catheter tip projects over the proximal to midportion of the SVC.  IMPRESSION: Interval extubation of the trachea. Mild hypoinflation. Prominent gaseous distention of bowel under the right hemidiaphragm and to a lesser extent on the left. There is no alveolar pneumonia. Minimal right basilar atelectasis is suspected.  An abdominal series would be useful to further assess the status of the bowel and exclude occult free subdiaphragmatic gas.   Electronically Signed   By: David  Martinique M.D.   On: 07/02/2015 07:29   Dg Chest 1 View  07/01/2015   CLINICAL DATA:  Shortness of breath.  EXAM: CHEST 1 VIEW  COMPARISON:  06/29/2015.  CT 06/25/2015  FINDINGS: Endotracheal tube, NG tube, left IJ line stable position.  Heart size normal. Right upper lobe nodular density is difficult to identify due to overlying EKG lead. No new focal pulmonary infiltrate. No pleural effusion or pneumothorax. No acute bony  abnormality.  IMPRESSION: 1. Lines and tubes in stable position. 2. Right upper lobe nodular density is difficult to visualize due to overlying EKG lead. No new infiltrate. Reference is made to prior CT report 06/25/2015.   Electronically Signed   By: Marcello Moores  Register   On: 07/01/2015 07:21   Dg Abd 1 View  07/02/2015   CLINICAL DATA:  Abdominal pain and distention, ileus  EXAM: ABDOMEN - 1 VIEW  COMPARISON:  06/26/2015  FINDINGS: There is gaseous distension of both large and small bowel loops no evidence for free intraperitoneal air on this supine view. No organomegaly or abnormal calcifications. Previous right hip arthroplasty. Degenerative changes in the hips and spine.  IMPRESSION: Ileus bowel gas pattern.   Electronically Signed   By: Nolon Nations M.D.   On: 07/02/2015 09:20    EKG:   Orders placed or performed during the hospital encounter of 06/25/15  . EKG 12-Lead  . EKG 12-Lead  . ED EKG  . ED EKG    ASSESSMENT AND PLAN:   Aaron Hendrix is a 81 y.o. male with a known history of COPD, irritable bowel syndrome, hypertension, mild dementia presents from home secondary to altered mental status.  #1 altered mental status-likely secondary to acute hypoxic respiratory failure,sepsis and hyponatremia. - CT head normal -resolved -Initial ABG has revealed acute respiratory acidosis with hypercapnia. Now extubated day 2 -Met septic criteria with leukocytosis, altered mental status and chest x-ray with infiltrate/mass -BC negatvie -sepsis now ruled out  #2 acute respiratory failure with acute respiratory acidosis and hypercapnia secondary to pneumonia and COPD exacerbation -Initial ABG  revealed pH 6.99 and PCO2 at 160 -gas improved after intubation.---> now extubated (10/5) -BIPAP prn  -on 2 liter [place]  #3 right upper lobe spiculated lung mass  Oncology is consulted-appreciated Dr Metro Kung note-->w/u as out pt. Will need PET scan  #4 sepsis secondary to right upper lobe  pneumonia -completed rx with  Azithromycin -BC negative  #5 acute exacerbation of COPD-stable -prn nebs -off steroids  #6 hyponatremia could be from SIADH secondary to problem #3 IV 3% NS (10/1) now d/ced Sodium is slightly better from 118--> 124--->134 Nephrology consult noted CT head negative  #7 History of hypothyroidism Continue levothyroxin  #8 Nutrition Start po diet  #9 Ileus -get KUB -pt tolerating po diet -stool softners. -needs to get up and be around. PT consulted  Transfer to 2A D/c foley  All the records are reviewed and case discussed with Care Management/Social Worker. D/w wife   in theroom  CODE STATUS: Full code  TOTAL critical care TIME TAKING CARE OF THIS PATIENT: 40 minutes. More than 50% time was spent in coordination of care, discussing with family D/w Dr Janey Genta M.D on 07/02/2015   Between 7am to 6pm - Pager - [phone] After 6pm go to [email or web address] - password [email or web address] [facility]  Office  [phone]  CC: Primary care physician; [name], MD

## 2015-07-02 NOTE — Care Management Note (Signed)
Case Management Note  Patient Details  Name: Aaron Hendrix MRN: [id number] Date of Birth: 01/12/1930  Subjective/Objective:    Admitted with altered mental status  due to respiratory acidosis, hypercapnia secondary to pneumonia and COPD exacerbation. Extubated yesterday, weaned to [place]. Met with wife and spoke with daughter by phone.  Daughter states she would like patient to use [name] for home health. Referral called to Corliss Blacker at [place]. Daughter is also concerned that patient will need home O2 and Bipap. Daughter states that Dr. Bernie Covey recommended the Bipap.  Explained he would have to qualify for home O2 and the Bipap would have to be prescribed by the doctor and a sleep study performed as outpatient. She would like to use [facility] for DME. PCP is [facility].   Action/Plan: [name]  Expected Discharge Date:                  Expected Discharge Plan:  [place]  In-House Referral:     Discharge planning Services  CM Consult  Post Acute Care Choice:    Choice offered to:     DME Arranged:    DME Agency:     HH Arranged:    [place] Agency:     Status of Service:  In process, will continue to follow  Medicare Important Message Given:  Yes-second notification given Date Medicare IM Given:    Medicare IM give by:    Date Additional Medicare IM Given:    Additional Medicare Important Message give by:     If discussed at [place] of Stay Meetings, dates discussed:    Additional Comments:  Jolly Mango, RN 07/02/2015, 8:56 AM

## 2015-07-02 NOTE — Progress Notes (Signed)
Called by RN, pt refusing to keep BIPAP mask on. RN placing pt back on 3L [place].

## 2015-07-02 NOTE — Progress Notes (Signed)
Report called to Tammy, RN on 2A. Patient is alert to self and place. Denies pain. NSR-ST per cardiac monitor with PVCs. o2 sats 100% on 2L. No respiratory distress noted. Wife and family at bedside today. Patient will be moved to room 233.

## 2015-07-02 NOTE — Progress Notes (Signed)
This RN notified Dr. Posey Pronto that when moving patient from ICU bed to 2A bed just prior to transfer that above patient's knees bilaterally appear bruised or mottled in appearance and that patient has +2 palpable bilateral pedal pulses.  Dr. Posey Pronto stated "okay will you just let the 2A nurse know to keep an eye on it." This RN then called Tammy, RN on 2A and made her aware of all mentioned above and that Dr. Posey Pronto is aware and that nurse should monitor mottling above patient's knees and pulses. Tammy, RN acknowledged.

## 2015-07-02 NOTE — Progress Notes (Signed)
Pastoral Care and prayer provided.

## 2015-07-02 NOTE — Progress Notes (Signed)
[facility] Progess Note    ASSESSMENT/PLAN    The patient was admitted on September 29 with acute unresponsiveness. He was subsequently intubated. 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 This is thought to be secondary to a possible lung cancer as the patient has a lung nodule. His weaning has been complicated by hypercapnia due to hypoventilation.  PULMONARY ETT - 8.0 A: The patient underwent trial extubation yesterday and has since been doing well, he is been maintained on BiPAP overnight and appears to be tolerating it well. -Respiratory failure, hypoxic and hypercapnic, acute -Possible RUL PNA, on azithromycin completed a course of azithromycin yesterday. -RUL nodule; possible primary lung cancer. We'll consider ENB, bronchoscopy  when clinically stable. Oncology has been consulted -Hypercapnic respiratory failure, with hypoventilation, this appears to be chronic  going back to May 2015. Suspect this is secondary to chronic hypoventilation from COPD, though neurological disease could also be a cause. -Patient with probable underlying COPD -Possible acute bronchitis, doubt pneumonia.   Okay to transfer out of the ICU to the general medical floor. We'll need to follow up outpatient with pulmonary service for continued follow-up. The patient's hypercapnia as well as his undiagnosed lung nodule.   CARDIOVASCULAR CVL - LIG  P:  - Resolved. -We'll use when necessary meds for tachycardia.  RENAL A:  Hyponatremia due to SIADH , now resolved after receiving 3% hypertonic saline. P:  - could be the result RUL lesion or RUL PNA   GASTROINTESTINAL Hx of IBS No acute issues at this time. We'll continue H2 blocker.  HEMATOLOGIC A: RUL spiculated lung lesion - 1.5cm P:  - workup in the future, with biopsy (possibly ENB bronchoscopy when the patient is stable.) -Oncology consult noted.  INFECTIOUS A: Suspect acute  bronchitis. P:  BCx2 9/29 Trach Asp 9/30>> showed strep pneumo Abx: vanc/zosyn 9/30, discontinued. Azithro 9/29>> 07/01/2015   NEUROLOGIC -follows commands   INDWELLING DEVICES:: Left internal jugular CVC: 06/26/2015 Urethral catheter: 06/26/2015 NG tube: 06/26/2015 Intubated. 06/26/2015    ---------------------------------------   ----------------------------------------   Name: Aaron Hendrix MRN: [id number] DOB: 11-14-1929    ADMISSION DATE:  06/25/2015    CHIEF COMPLAINT:  Dyspnea   SUBJECTIVE:  The patient is currently awake and alert, appears to be doing very well. He has no new complaints overnight. He was maintained on BiPAP overnight without difficulty.  Review of Systems:  The patient denies chest pain, orthopnea, remainder review systems reviewed with the patient and found to be negative.   VITAL SIGNS: Temp:  [98.4 F (36.9 C)-99.3 F (37.4 C)] 98.6 F (37 C) (10/06 1200) Pulse Rate:  [81-116] 89 (10/06 1100) Resp:  [20-31] 24 (10/06 1200) BP: (140-181)/(67-111) 140/77 mmHg (10/06 1200) SpO2:  [92 %-100 %] 100 % (10/06 1200) FiO2 (%):  [30 %] 30 % (10/06 0140) Weight:  [67.9 kg (149 lb 11.1 oz)] 67.9 kg (149 lb 11.1 oz) (10/06 0500) HEMODYNAMICS:   VENTILATOR SETTINGS: Vent Mode:  [-]  FiO2 (%):  [30 %] 30 % INTAKE / OUTPUT:  Intake/Output Summary (Last 24 hours) at 07/02/15 1341 Last data filed at 07/02/15 1300  Gross per 24 hour  Intake    223 ml  Output   1350 ml  Net  -1127 ml    PHYSICAL EXAMINATION: Physical Examination:   VS: BP 140/77 mmHg  Pulse 89  Temp(Src) 98.6 F (37 C) (Axillary)  Resp 24  Ht '5\' 7"'$  (1.702 m)  Wt 67.9 kg (149 lb 11.1 oz)  BMI 23.44 kg/m2  SpO2 100%  General Appearance: No distress  Neuro:without focal findings, mental status normal. HEENT: PERRLA, EOM intact. Pulmonary: normal breath sounds   CardiovascularNormal S1,S2.  No m/r/g.   Abdomen: Benign, Soft, non-tender. Renal:  No  costovertebral tenderness  GU:  Not performed at this time. Endocrine: No evident thyromegaly. Skin:   warm, no rashes, no ecchymosis  Extremities: normal, no cyanosis, clubbing.   LABS:   LABORATORY PANEL:   CBC  Recent Labs Lab 07/02/15 0459  WBC 15.5*  HGB 12.9*  HCT 40.9  PLT 310    Chemistries   Recent Labs Lab 06/27/15 0512  07/02/15 0459  NA 124*  < > 140  K 3.7  < > 3.7  CL 84*  < > 97*  CO2 33*  < > 37*  GLUCOSE 257*  < > 144*  BUN 24*  < > 40*  CREATININE 1.32*  < > 0.87  CALCIUM 8.1*  < > 9.1  MG 1.3*  --   --   PHOS 2.3*  --   --   < > = values in this interval not displayed.   Recent Labs Lab 07/01/15 0621 07/01/15 1110 07/01/15 1838 07/01/15 2347 07/02/15 0520 07/02/15 1208  GLUCAP 190* 117* 93 126* 124* 136*    Recent Labs Lab 06/29/15 1115 06/30/15 1100 07/01/15 1110  PHART 7.26* 7.31* 7.36  PCO2ART 76* 72* 72*  PO2ART 106 95 75*   No results for input(s): AST, ALT, ALKPHOS, BILITOT, ALBUMIN in the last 168 hours.  Cardiac Enzymes  Recent Labs Lab 06/26/15 1831  TROPONINI 0.06*    RADIOLOGY:  Dg Chest 1 View  07/02/2015   CLINICAL DATA:  Community-acquired pneumonia, dyspnea, history of COPD -asthma.  EXAM: CHEST 1 VIEW  COMPARISON:  Portable chest x-ray of July 01, 2015  FINDINGS: There is interval extubation of the trachea and of the esophagus. The lungs are borderline hypoinflated. There is considerable gaseous distention of bowel loops under the hemidiaphragm. There is no significant pleural effusion. The cardiac silhouette is normal in size. The central pulmonary vascularity is mildly prominent though stable. The left internal jugular venous catheter tip projects over the proximal to midportion of the SVC.  IMPRESSION: Interval extubation of the trachea. Mild hypoinflation. Prominent gaseous distention of bowel under the right hemidiaphragm and to a lesser extent on the left. There is no alveolar pneumonia. Minimal right  basilar atelectasis is suspected.  An abdominal series would be useful to further assess the status of the bowel and exclude occult free subdiaphragmatic gas.   Electronically Signed   By: David  Martinique M.D.   On: 07/02/2015 07:29   Dg Chest 1 View  07/01/2015   CLINICAL DATA:  Shortness of breath.  EXAM: CHEST 1 VIEW  COMPARISON:  06/29/2015.  CT 06/25/2015  FINDINGS: Endotracheal tube, NG tube, left IJ line stable position. Heart size normal. Right upper lobe nodular density is difficult to identify due to overlying EKG lead. No new focal pulmonary infiltrate. No pleural effusion or pneumothorax. No acute bony abnormality.  IMPRESSION: 1. Lines and tubes in stable position. 2. Right upper lobe nodular density is difficult to visualize due to overlying EKG lead. No new infiltrate. Reference is made to prior CT report 06/25/2015.   Electronically Signed   By: Marcello Moores  Register   On: 07/01/2015 07:21   Dg Abd 1 View  07/02/2015   CLINICAL DATA:  Abdominal pain  and distention, ileus  EXAM: ABDOMEN - 1 VIEW  COMPARISON:  06/26/2015  FINDINGS: There is gaseous distension of both large and small bowel loops no evidence for free intraperitoneal air on this supine view. No organomegaly or abnormal calcifications. Previous right hip arthroplasty. Degenerative changes in the hips and spine.  IMPRESSION: Ileus bowel gas pattern.   Electronically Signed   By: Nolon Nations M.D.   On: 07/02/2015 09:20       --Marda Stalker, MD.  Pager [phone] [facility] Office Number: [phone]  Patricia Pesa, M.D.  Vilinda Boehringer, M.D.  Merton Border, M.D

## 2015-07-02 NOTE — Progress Notes (Signed)
Nutrition Follow-up  DOCUMENTATION CODES:   Non-severe (moderate) malnutrition in context of chronic illness  INTERVENTION:  Meals and snacks: Cater to pt preferences Medical Nutrition Supplement Therapy: Recommend Ensure Enlive po BID, each supplement provides 350 kcal and 20 grams of protein    NUTRITION DIAGNOSIS:   Inadequate oral intake related to acute illness as evidenced by NPO statusbeing addressed with starting solid foods and supplement    GOAL:   Patient will meet greater than or equal to 90% of their needs    MONITOR:    (Energy intake, Electrolyte and renal profile, Digestive system)  REASON FOR ASSESSMENT:   Ventilator    ASSESSMENT:   Pt extubated on bipap at night  Current Nutrition: tolerating liquids  Food/Nutrition prior to admission: Wife reports good appetite prior to admission, typically has fruit, yogurt, waffle in am, sandwich or soup for lunch and meat and veggies for supper   Gastrointestinal Profile: per RN, xray showing ileus Last BM: 10/5 per RN, Brittney   Medications: dulcolax, senokot s  Electrolyte/Renal Profile and Glucose Profile:   Recent Labs Lab 06/27/15 0512  06/29/15 0406 07/01/15 0500 07/02/15 0459  NA 124*  < > 135 137 140  K 3.7  < > 4.1 4.2 3.7  CL 84*  < > 99* 98* 97*  CO2 33*  < > 35* 35* 37*  BUN 24*  < > 32* 35* 40*  CREATININE 1.32*  < > 0.78 0.70 0.87  CALCIUM 8.1*  < > 8.2* 8.9 9.1  MG 1.3*  --   --   --   --   PHOS 2.3*  --   --   --   --   GLUCOSE 257*  < > 209* 237* 144*  < > = values in this interval not displayed.   Nutrition-Focused Physical Exam Findings: Nutrition-Focused physical exam completed. Findings are normal to mild/moderate  fat depletion, mild/moderate to severe muscle depletion, and no edema.      Weight Trend since Admission: Filed Weights   06/30/15 0500 07/01/15 0500 07/02/15 0500  Weight: 151 lb 10.8 oz (68.8 kg) 153 lb 14.1 oz (69.8 kg) 149 lb 11.1 oz (67.9 kg)       Diet Order:  Diet Heart Room service appropriate?: Yes; Fluid consistency:: Thin  Skin:  Reviewed, no issues   Height:   Ht Readings from Last 1 Encounters:  06/25/15 '5\' 7"'$  (1.702 m)    Weight:   Wt Readings from Last 1 Encounters:  07/02/15 149 lb 11.1 oz (67.9 kg)       BMI:  Body mass index is 23.44 kg/(m^2).  Estimated Nutritional Needs:   Kcal:  1467 kcals (BEE 1314, Ve; 7.7, Tmax: 37) using current wt of 66 kg  Protein:  99-132 g (1.5-2.0 g/kg)   Fluid:  1650-1950 mL (25-20 ml/kg)   EDUCATION NEEDS:   No education needs identified at this time  MODERATE Care Level  Colsen Modi B. Zenia Resides, McIntosh, McPherson (pager)

## 2015-07-02 NOTE — Evaluation (Signed)
Physical Therapy Evaluation Patient Details Name: ANUAR WALGREN MRN: [id number] DOB: Feb 09, 1930 Today's Date: 07/02/2015   History of Present Illness  The patient was admitted on September 29 with acute unresponsiveness. He was subsequently intubated. 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 This is thought to be secondary to a possible lung cancer as the patient has a lung nodule. Pt was intubated and extubated and now is doing well on 2L )2   Clinical Impression  Pt A&O x 1 (only oriented to person); and appears to be generally confused when speaking and has a hard time consistently following commands. Pt requires mod assist for bed mobility (supine<>sit) with vc for sequencing of movements. Pt is min assist w/ sit<>stand transfer w/ BUE support on RW. Once up in standing, pt able to maintain standing balance with BUE support on RW and min guard. Pt displayed impulsiveness on several occasions when trying to come from sit to stand, needs to be reminded not to get up without PT being near to guard. Pt unable to ambulate today or perform stand pivot transfer due to inability to sequence movement of LEs and general weakness. Pt will benefit from further skilled acute PT in order to increase capacity for functional mobility and increase functional LE strength.     Follow Up Recommendations SNF    Equipment Recommendations       Recommendations for Other Services       Precautions / Restrictions Precautions Precautions: Fall Restrictions Weight Bearing Restrictions: No      Mobility  Bed Mobility Overal bed mobility: Needs Assistance Bed Mobility: Supine to Sit     Supine to sit: Mod assist     General bed mobility comments: pt needs significant amount of vc for sequencing necessary to complete transfer. Mod assist needed for control of trunk and shoulders  Transfers Overall transfer level: Needs assistance Equipment used: Rolling walker (2  wheeled) Transfers: Sit to/from [facility] Sit to Stand: Min assist Stand pivot transfers:  (unable to successfully complete; not able to take steps in order to pivot)       General transfer comment: Pt displays impulsiveness and will attempt to stand without PT assistance/guarding. Pt does well when up in standing but lacks terminal knee extension so unable to come to complete standing. Pt states that his legs feel weak.   Ambulation/Gait Ambulation/Gait assistance:  (not performed today; pt unable to take steps due to LE weakness)              Stairs            Wheelchair Mobility    Modified Rankin (Stroke Patients Only)       Balance Overall balance assessment: Needs assistance Sitting-balance support: No upper extremity supported;Feet supported Sitting balance-Leahy Scale: Fair     Standing balance support: Bilateral upper extremity supported;During functional activity Standing balance-Leahy Scale: Fair Standing balance comment: Pt requires BUE support on RW with standing or transfering.                              Pertinent Vitals/Pain Pain Assessment:  (pain not rated)    Home Living Family/patient expects to be discharged to:: Private residence Living Arrangements: Spouse/significant other   Type of Home: House Home Access: Level entry     Home Layout: Able to live on main level with bedroom/bathroom (family room in basement, all essential needs  available on first floor) Home Equipment: Walker - 2 wheels;Walker - 4 wheels;Shower seat      Prior Function Level of Independence: Independent               Hand Dominance        Extremity/Trunk Assessment   Upper Extremity Assessment: Overall WFL for tasks assessed (gross BUE strength 3+/5)           Lower Extremity Assessment: Overall WFL for tasks assessed (gross BLE strength 3+/5)         Communication   Communication: HOH;Expressive difficulties   Cognition Arousal/Alertness: Lethargic Behavior During Therapy: Flat affect Overall Cognitive Status: Impaired/Different from baseline Area of Impairment: Orientation;Following commands;Awareness;Attention Orientation Level: Disoriented to;Place;Time;Situation Current Attention Level: Divided   Following Commands: Follows one step commands inconsistently       General Comments: Pt seems very confused; hard to determine if this is different from his baseline. Inconsistent with following simple commands and displays impulsiveness.     General Comments      Exercises Other Exercises Other Exercises: multiple reps of sit<>stand (BUE on RW/ min assist +1) in order to complete toileting/clean up from bowel movement that he initiated while lying in bed: Pt unable to complete a stand pivot transfer to [facility] due to inability to sequence movement of feet despite verbal  and tactile cues.       Assessment/Plan    PT Assessment Patient needs continued PT services  PT Diagnosis Difficulty walking;Generalized weakness;Altered mental status   PT Problem List Decreased strength;Decreased range of motion;Decreased activity tolerance;Decreased balance;Decreased mobility;Decreased coordination;Decreased cognition;Decreased safety awareness;Cardiopulmonary status limiting activity  PT Treatment Interventions DME instruction;Gait training;Functional mobility training;Therapeutic activities;Therapeutic exercise;Balance training   PT Goals (Current goals can be found in the Care Plan section) Acute Rehab PT Goals Patient Stated Goal: pt did not state a goal    Frequency Min 2X/week   Barriers to discharge        Co-evaluation               End of Session Equipment Utilized During Treatment:  (gait belt should be used ) Activity Tolerance: Patient limited by fatigue Patient left: in bed;with nursing/sitter in room;with family/visitor present;with bed alarm set;with call bell/phone within  reach           Time: 1425-1505 PT Time Calculation (min) (ACUTE ONLY): 40 min   Charges:         PT G CodesMilon Score 07/02/2015, 3:50 PM

## 2015-07-03 DIAGNOSIS — J449 Chronic obstructive pulmonary disease, unspecified: Secondary | ICD-10-CM

## 2015-07-03 LAB — GLUCOSE, CAPILLARY
GLUCOSE-CAPILLARY: 93 mg/dL (ref 65–99)
GLUCOSE-CAPILLARY: 98 mg/dL (ref 65–99)
Glucose-Capillary: 118 mg/dL — ABNORMAL HIGH (ref 65–99)
Glucose-Capillary: 195 mg/dL — ABNORMAL HIGH (ref 65–99)
Glucose-Capillary: 213 mg/dL — ABNORMAL HIGH (ref 65–99)

## 2015-07-03 MED ORDER — AMLODIPINE BESYLATE 5 MG PO TABS
5.0000 mg | ORAL_TABLET | Freq: Every day | ORAL | Status: DC
  Administered 2015-07-03 – 2015-07-06 (×4): 5 mg via ORAL
  Filled 2015-07-03 (×4): qty 1

## 2015-07-03 MED ORDER — SODIUM CHLORIDE 0.9 % IJ SOLN: 10.0000 mL | INTRAMUSCULAR | Status: DC | PRN

## 2015-07-03 MED ORDER — TIOTROPIUM BROMIDE MONOHYDRATE 18 MCG IN CAPS
18.0000 ug | ORAL_CAPSULE | Freq: Every day | RESPIRATORY_TRACT | Status: DC
  Administered 2015-07-03 – 2015-07-06 (×4): 18 ug via RESPIRATORY_TRACT
  Filled 2015-07-03: qty 5

## 2015-07-03 MED ORDER — ALBUTEROL SULFATE (2.5 MG/3ML) 0.083% IN NEBU
2.5000 mg | INHALATION_SOLUTION | Freq: Four times a day (QID) | RESPIRATORY_TRACT | Status: DC
  Administered 2015-07-03 – 2015-07-06 (×10): 2.5 mg via RESPIRATORY_TRACT
  Filled 2015-07-03 (×11): qty 3

## 2015-07-03 MED ORDER — SODIUM CHLORIDE 0.9 % IJ SOLN
10.0000 mL | Freq: Two times a day (BID) | INTRAMUSCULAR | Status: DC
Start: 2015-07-03 — End: 2015-07-06
  Administered 2015-07-03: 20 mL
  Administered 2015-07-03 – 2015-07-04 (×2): 10 mL

## 2015-07-03 NOTE — Care Management Note (Addendum)
Case Management Note  Patient Details  Name: Aaron Hendrix MRN: [id number] Date of Birth: 01/31/1930  Subjective/Objective:                  Overnight Oximetry needed along with ABG on room air- Dr. Posey Pronto paged. This is for BiPAP assessment. I spoke with patient's wife Aaron Hendrix and she asked that talk to her daughter Aaron Hendrix [phone] which I did. All agree to SNF but they want a private room. CSW notified. He is currently not requiring O2.  Action/Plan: [name] updated on discharge plan. Waiting for assessment/orders for Bipap from MD; referral called to Will with [facility] so they can follow for Bipap regardless where he goes at discharge.   Expected Discharge Date:                  Expected Discharge Plan:  Skilled Nursing Facility  In-House Referral:  Clinical Social Work  Discharge planning Services  CM Consult  Post Acute Care Choice:    Choice offered to:  Spouse, Adult Children  DME Arranged:  Bipap DME Agency:  [place] Arranged:  PT, RN [facility] Agency:  [place]  Status of Service:  In process, will continue to follow  Medicare Important Message Given:  Yes-third notification given Date Medicare IM Given:    Medicare IM give by:    Date Additional Medicare IM Given:    Additional Medicare Important Message give by:     If discussed at [place] of Stay Meetings, dates discussed:    Additional Comments:  Marshell Garfinkel, RN 07/03/2015, 1:37 PM

## 2015-07-03 NOTE — Clinical Social Work Placement (Signed)
   CLINICAL SOCIAL WORK PLACEMENT  NOTE  Date:  07/03/2015  Patient Details  Name: Aaron Hendrix MRN: [id number] Date of Birth: 05/14/1930  Clinical Social Work is seeking post-discharge placement for this patient at the [place] level of care (*CSW will initial, date and re-position this form in  chart as items are completed):  Yes   Patient/family provided with [place] Work Department's list of facilities offering this level of care within the geographic area requested by the patient (or if unable, by the patient's family).  Yes   Patient/family informed of their freedom to choose among providers that offer the needed level of care, that participate in Medicare, Medicaid or managed care program needed by the patient, have an available bed and are willing to accept the patient.  Yes   Patient/family informed of [facility]'s ownership interest in [facility] and [facility], as well as of the fact that they are under no obligation to receive care at these facilities.  PASRR submitted to EDS on 07/03/15     PASRR number received on 07/03/15     Existing PASRR number confirmed on       FL2 transmitted to all facilities in geographic area requested by pt/family on 07/03/15     FL2 transmitted to all facilities within larger geographic area on       Patient informed that his/her managed care company has contracts with or will negotiate with certain facilities, including the following:            Patient/family informed of bed offers received.  Patient chooses bed at       Physician recommends and patient chooses bed at      Patient to be transferred to   on  .  Patient to be transferred to facility by       Patient family notified on   of transfer.  Name of family member notified:        PHYSICIAN Please sign FL2     Additional Comment:    _______________________________________________ Loralyn Freshwater, LCSW 07/03/2015,  3:58 PM

## 2015-07-03 NOTE — Care Management Important Message (Signed)
Important Message  Patient Details  Name: Aaron Hendrix MRN: [id number] Date of Birth: [birth year]   Medicare Important Message Given:  Yes-third notification given    Marshell Garfinkel, RN 07/03/2015, 8:49 AM

## 2015-07-03 NOTE — Progress Notes (Signed)
[facility] Progess Note    ASSESSMENT/PLAN    The patient was admitted on September 29 with acute unresponsiveness. He was subsequently intubated. 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 This is thought to be secondary to a possible lung cancer/ Strep pneumonia  as the patient has a lung nodule. His weaning has been complicated by hypercapnia due to hypoventilation.  PULMONARY s/p extubation on [place] oxygen - he is been maintained on BiPAP overnight and appears to be tolerating it well. -Respiratory failure, hypoxic and hypercapnic, acute-resolving -Possible RUL PNA, on azithromycin completed a course of azithromycin  -RUL nodule; possible primary lung cancer. We'll consider ENB, bronchoscopy  when clinically stable. Oncology has been consulted -Hypercapnic respiratory failure, with hypoventilation, this appears to be chronic  going back to May 2015. Suspect this is secondary to chronic hypoventilation from COPD, though neurological disease could also be a cause. -Patient with probable underlying COPD -Possible acute bronchitis, pneumonia.  -will start spiriva, on dulera -will follow up in [facility] in 2-3 weeks and assess RUL nodular density with repeat Ct chest -consult social worker and discharge planner to d/c patient on biPAP  CARDIOVASCULAR -recommend removing central lines   RENAL A:  Hyponatremia due to SIADH , now resolved after receiving 3% hypertonic saline.    GASTROINTESTINAL Hx of IBS No acute issues at this time. We'll continue H2 blocker.  HEMATOLOGIC A: RUL spiculated lung lesion - 1.5cm P:  - workup in the future, with biopsy (possibly ENB bronchoscopy when the patient is stable.) -Oncology consult noted.  INFECTIOUS A: Suspect acute bronchitis. P:  BCx2 9/29 Trach Asp 9/30>> showed strep pneumo Abx: vanc/zosyn 9/30, discontinued. Azithro 9/29>> 07/01/2015   NEUROLOGIC -follows  commands   INDWELLING DEVICES:: Left internal jugular CVC: 06/26/2015 Urethral catheter: 06/26/2015 d/c 'd NG tube: 06/26/2015 d/c'd Intubated. 06/26/2015 extubated 10/5    ---------------------------------------   ----------------------------------------   Name: Aaron Hendrix MRN: [id number] DOB: [birth year]    ADMISSION DATE:  06/25/2015    CHIEF COMPLAINT:  Dyspnea   SUBJECTIVE:  The patient is currently awake and alert, appears to be doing very well.  He has no new complaints overnight. He was maintained on BiPAP overnight without difficulty. -continue working with PT  Review of Systems:  The patient denies chest pain, orthopnea, remainder review systems reviewed with the patient and found to be negative.   VITAL SIGNS: Temp:  [97.7 F (36.5 C)-98.7 F (37.1 C)] 98.7 F (37.1 C) (10/07 0434) Pulse Rate:  [89-106] 102 (10/07 0434) Resp:  [16-24] 16 (10/07 0434) BP: (140-183)/(60-81) 171/80 mmHg (10/07 0434) SpO2:  [92 %-100 %] 100 % (10/07 0816) Weight:  [150 lb 12.8 oz (68.402 kg)] 150 lb 12.8 oz (68.402 kg) (10/07 0556) HEMODYNAMICS:   VENTILATOR SETTINGS:   INTAKE / OUTPUT:  Intake/Output Summary (Last 24 hours) at 07/03/15 0942 Last data filed at 07/02/15 2113  Gross per 24 hour  Intake    223 ml  Output    375 ml  Net   -152 ml    PHYSICAL EXAMINATION: Physical Examination:   VS: BP 171/80 mmHg  Pulse 102  Temp(Src) 98.7 F (37.1 C) (Oral)  Resp 16  Ht '5\' 7"'$  (1.702 m)  Wt 150 lb 12.8 oz (68.402 kg)  BMI 23.61 kg/m2  SpO2 100%  General Appearance: No distress  Neuro:without focal findings, mental status normal. HEENT: PERRLA, EOM intact. Pulmonary: normal breath sounds   CardiovascularNormal S1,S2.  No m/r/g.   Abdomen: Benign, Soft, non-tender. Renal:  No costovertebral tenderness  GU:  Not performed at this time. Endocrine: No evident thyromegaly. Skin:   warm, no rashes, no ecchymosis  Extremities: normal, no cyanosis,  clubbing.   LABS:   LABORATORY PANEL:   CBC  Recent Labs Lab 07/02/15 0459  WBC 15.5*  HGB 12.9*  HCT 40.9  PLT 310    Chemistries   Recent Labs Lab 06/27/15 0512  07/02/15 0459  NA 124*  < > 140  K 3.7  < > 3.7  CL 84*  < > 97*  CO2 33*  < > 37*  GLUCOSE 257*  < > 144*  BUN 24*  < > 40*  CREATININE 1.32*  < > 0.87  CALCIUM 8.1*  < > 9.1  MG 1.3*  --   --   PHOS 2.3*  --   --   < > = values in this interval not displayed.   Recent Labs Lab 07/02/15 1208 07/02/15 1625 07/02/15 2040 07/02/15 2331 07/03/15 0555 07/03/15 0734  GLUCAP 136* 176* 171* 145* 93 98    Recent Labs Lab 06/29/15 1115 06/30/15 1100 07/01/15 1110  PHART 7.26* 7.31* 7.36  PCO2ART 76* 72* 72*  PO2ART 106 95 75*   No results for input(s): AST, ALT, ALKPHOS, BILITOT, ALBUMIN in the last 168 hours.  Cardiac Enzymes  Recent Labs Lab 06/26/15 1831  TROPONINI 0.06*    RADIOLOGY:  Dg Chest 1 View  07/02/2015   CLINICAL DATA:  Community-acquired pneumonia, dyspnea, history of COPD -asthma.  EXAM: CHEST 1 VIEW  COMPARISON:  Portable chest x-ray of July 01, 2015  FINDINGS: There is interval extubation of the trachea and of the esophagus. The lungs are borderline hypoinflated. There is considerable gaseous distention of bowel loops under the hemidiaphragm. There is no significant pleural effusion. The cardiac silhouette is normal in size. The central pulmonary vascularity is mildly prominent though stable. The left internal jugular venous catheter tip projects over the proximal to midportion of the SVC.  IMPRESSION: Interval extubation of the trachea. Mild hypoinflation. Prominent gaseous distention of bowel under the right hemidiaphragm and to a lesser extent on the left. There is no alveolar pneumonia. Minimal right basilar atelectasis is suspected.  An abdominal series would be useful to further assess the status of the bowel and exclude occult free subdiaphragmatic gas.    Electronically Signed   By: David  Martinique M.D.   On: 07/02/2015 07:29   Dg Abd 1 View  07/02/2015   CLINICAL DATA:  Abdominal pain and distention, ileus  EXAM: ABDOMEN - 1 VIEW  COMPARISON:  06/26/2015  FINDINGS: There is gaseous distension of both large and small bowel loops no evidence for free intraperitoneal air on this supine view. No organomegaly or abnormal calcifications. Previous right hip arthroplasty. Degenerative changes in the hips and spine.  IMPRESSION: Ileus bowel gas pattern.   Electronically Signed   By: Nolon Nations M.D.   On: 07/02/2015 09:20     I have personally obtained a history, examined the patient, evaluated Pertinent laboratory and [employer]/imaging results, and  formulated the assessment and plan   The Patient requires high complexity decision making for assessment and support, frequent evaluation and titration of therapies. Patient/Family are satisfied with Plan of action and management. All questions answered  Corrin Parker, M.D.  [facility]  Medical Director [place] Director [facility] Cardio-Pulmonary Department

## 2015-07-03 NOTE — Progress Notes (Signed)
Physical Therapy Treatment Patient Details Name: Aaron Hendrix MRN: [id number] DOB: March 27, 1930 Today's Date: 07/03/2015    History of Present Illness The patient was admitted on September 29 with acute unresponsiveness. He was subsequently intubated. 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 This is thought to be secondary to a possible lung cancer as the patient has a lung nodule. Pt was intubated and extubated and now is doing well on 2L )2     PT Comments    Pt appeared to be much more alert and less confused today as compared to at the time of the initial evaluation yesterday. Per MD's orders, attempted to assess how pt handled performing activity on room air. Pt's SpO2% was 93% when on room air and resting. With activity (supine <>sitting), pt's O2% dropped to 86% and remained there for 2 minutes without returning back to resting levels. Pt was placed back on 2L O2 via [place]. Pt performed sit<>stand x 6 with min assist (provided at posterior hip to promote full hip extension) and RW. Stand pivot transfer also performed with min assist and RW (assistance also provided to move walker).  Pt displayed better capacity for functional movement but is still unsteady when up on his feet. Has mild resting tremor which effects his walker use. Pt was able to ambulate x10 feet w/ RW and min assist and help with progression/steering of walker. Pt's O2 saturation was monitored throughout, found to be lower with hand held pulse ox than expected. Dynamap was used to take O2 reading at end of session when pt was on room air, pt's O2 sats were found to be healthy and in the mid to upper 90s. Pt should be monitored throughout the day to see how being on room air effects his O2 levels. Pt requires further skilled acute PT services in order to increase capacity for functional mobility. Current d/c recommendations remain appropriate.   Follow Up Recommendations  SNF     Equipment  Recommendations  Rolling walker with 5" wheels    Recommendations for Other Services       Precautions / Restrictions Precautions Precautions: Fall Restrictions Weight Bearing Restrictions: No    Mobility  Bed Mobility Overal bed mobility: Modified Independent Bed Mobility: Supine to Sit     Supine to sit: Modified independent (Device/Increase time)     General bed mobility comments: Pt able to perform supine<>sitting transfer with assistance from bed rail and vc for sequencing of movements (i.e. reaching across with opposite hand to pull up on bed rail)   Transfers Overall transfer level: Needs assistance Equipment used: Rolling walker (2 wheeled) Transfers: Sit to/from [facility] Sit to Stand: Min assist Stand pivot transfers: Min assist       General transfer comment: Pt needs to be reminded to push down through the bed/arm rest of chairs when performing sit<>stand transfer. Pt did better with complete sit<>stand transfer when assist provided at hip to promote complete hip extension. Pt able to sequence feet for stand pivot transfer more efficiently today but still requires vc and min assist for control of walker.   Ambulation/Gait Ambulation/Gait assistance: Min assist Ambulation Distance (Feet): 10 Feet Assistive device: Rolling walker (2 wheeled) Gait Pattern/deviations: Step-to pattern;Decreased step length - left;Decreased stance time - right;Decreased stride length;Trunk flexed;Wide base of support Gait velocity: decreased Gait velocity interpretation: <1.8 ft/sec, indicative of risk for recurrent falls General Gait Details: Pt requires min assist with advancement of walker. Pt  takes short, shuffled steps unless vc provided to take bigger steps. Pt still mildly unsteady on his feet but has shown progress compared to yesterday.    Stairs            Wheelchair Mobility    Modified Rankin (Stroke Patients Only)       Balance    Sitting-balance support: Bilateral upper extremity supported (pt tends to sit with BUE supported but can maintain sitting balance when asked to raise arms in air) Sitting balance-Leahy Scale: Fair     Standing balance support: Bilateral upper extremity supported;During functional activity Standing balance-Leahy Scale: Fair Standing balance comment: Pt requires BUE support on RW with standing/transfering                    Cognition Arousal/Alertness: Awake/alert Behavior During Therapy: WFL for tasks assessed/performed Overall Cognitive Status: Within Functional Limits for tasks assessed                      Exercises Other Exercises Other Exercises: sit<>stand transfer x 6 w/ RW and min assist ( pt does better coming to standing with PT assist provided at posterior hip to promote complete hip extension)     General Comments        Pertinent Vitals/Pain Pain Assessment: No/denies pain    Home Living                      Prior Function            PT Goals (current goals can now be found in the care plan section) Acute Rehab PT Goals Patient Stated Goal: pt did not state a goal Progress towards PT goals: Progressing toward goals    Frequency  Min 2X/week    PT Plan Current plan remains appropriate    Co-evaluation             End of Session Equipment Utilized During Treatment: Gait belt Activity Tolerance: Patient tolerated treatment well Patient left: in chair;with call bell/phone within reach;with chair alarm set;with family/visitor present     Time: 5681-2751 PT Time Calculation (min) (ACUTE ONLY): 29 min  Charges:                       G CodesMilon Score 07/03/2015, 12:43 PM

## 2015-07-03 NOTE — Progress Notes (Signed)
Spoke with pt's family member about pt not keeping Bipap on. Informed him that it is ordered as needed and that it will remain at bedside and we will continue to assess the pt throughout the night. Pt will receive SVN at 0200. At this time pt is alert and in no respiratory distress.

## 2015-07-03 NOTE — Progress Notes (Signed)
Patient's wife at bedside. Patient has been resting well. Patient currently on room air, O2 sats 98%. Patient having some confusion. No acute  episodes to report.

## 2015-07-03 NOTE — Progress Notes (Signed)
Each morning to the [facility] at [place] NAME: Aaron Hendrix    MR#:  [id number]  DATE OF BIRTH:  1930/01/14  SUBJECTIVE:   pt  Doing well. Now on 2 liter [place] oxygen No respiratory distress. HAd BM y'day. No abdominal pain.Marland Kitchen tolerating diet Not tolerating CPAP too well  REVIEW OF SYSTEMS:  Review of systems unobtainable DRUG ALLERGIES:   Allergies  Allergen Reactions  . Fexofenadine Other (See Comments)  . Quinapril Hcl Other (See Comments)    Unknown   . Latex Rash    VITALS:  Blood pressure 111/50, pulse 66, temperature 97.9 F (36.6 C), temperature source Oral, resp. rate 18, height '5\' 7"'$  (1.702 m), weight 68.402 kg (150 lb 12.8 oz), SpO2 98 %.  PHYSICAL EXAMINATION:  GENERAL:  81 y.o.-year-old patient lying in the bed with no acute distress. Critically ill ,weak EYES: Pupils equal, round, reactive to light and accommodation. No scleral icterus.  HEENT: Head atraumatic, normocephalic.  NECK:  Supple, no jugular venous distention. No thyroid enlargement, no tenderness.  LUNGS: Shallow respirations with a decreased breath sounds bilaterally, minimal wheezing, no rales,rhonchi or crepitation. No use of accessory muscles of respiration.  CARDIOVASCULAR: S1, S2 normal. No murmurs, rubs, or gallops.tachycardia+ ABDOMEN: Soft, nontender, ++distended. Bowel soundshyperactive present. No organomegaly or mass.  EXTREMITIES: No pedal edema, cyanosis, or clubbing.  NEUROLOGIC: non focal , grossly normal. Weak overall PSYCHIATRIC: alert and oriented SKIN: No obvious rash, lesion, or ulcer.   LABORATORY PANEL:   CBC  Recent Labs Lab 07/02/15 0459  WBC 15.5*  HGB 12.9*  HCT 40.9  PLT 310   ------------------------------------------------------------------------------------------------------------------  Chemistries   Recent Labs Lab 06/27/15 0512  07/02/15 0459  NA 124*  < > 140  K 3.7  < > 3.7  CL 84*  < > 97*   CO2 33*  < > 37*  GLUCOSE 257*  < > 144*  BUN 24*  < > 40*  CREATININE 1.32*  < > 0.87  CALCIUM 8.1*  < > 9.1  MG 1.3*  --   --   < > = values in this interval not displayed. ------------------------------------------------------------------------------------------------------------------  Cardiac Enzymes  Recent Labs Lab 06/26/15 1831  TROPONINI 0.06*   ------------------------------------------------------------------------------------------------------------------  RADIOLOGY:  Dg Chest 1 View  07/02/2015   CLINICAL DATA:  Community-acquired pneumonia, dyspnea, history of COPD -asthma.  EXAM: CHEST 1 VIEW  COMPARISON:  Portable chest x-ray of July 01, 2015  FINDINGS: There is interval extubation of the trachea and of the esophagus. The lungs are borderline hypoinflated. There is considerable gaseous distention of bowel loops under the hemidiaphragm. There is no significant pleural effusion. The cardiac silhouette is normal in size. The central pulmonary vascularity is mildly prominent though stable. The left internal jugular venous catheter tip projects over the proximal to midportion of the SVC.  IMPRESSION: Interval extubation of the trachea. Mild hypoinflation. Prominent gaseous distention of bowel under the right hemidiaphragm and to a lesser extent on the left. There is no alveolar pneumonia. Minimal right basilar atelectasis is suspected.  An abdominal series would be useful to further assess the status of the bowel and exclude occult free subdiaphragmatic gas.   Electronically Signed   By: David  Martinique M.D.   On: 07/02/2015 07:29   Dg Abd 1 View  07/02/2015   CLINICAL DATA:  Abdominal pain and distention, ileus  EXAM: ABDOMEN - 1 VIEW  COMPARISON:  06/26/2015  FINDINGS: There is gaseous  distension of both large and small bowel loops no evidence for free intraperitoneal air on this supine view. No organomegaly or abnormal calcifications. Previous right hip arthroplasty.  Degenerative changes in the hips and spine.  IMPRESSION: Ileus bowel gas pattern.   Electronically Signed   By: Nolon Nations M.D.   On: 07/02/2015 09:20    EKG:   Orders placed or performed during the hospital encounter of 06/25/15  . EKG 12-Lead  . EKG 12-Lead  . ED EKG  . ED EKG    ASSESSMENT AND PLAN:   Aaron Hendrix is a 81 y.o. male with a known history of COPD, irritable bowel syndrome, hypertension, mild dementia presents from home secondary to altered mental status.  #1 altered mental status-likely secondary to acute hypoxic respiratory failure,sepsis and hyponatremia. - CT head normal -resolved -Initial ABG has revealed acute respiratory acidosis with hypercapnia. Now extubated day 3 -BC negatvie -sepsis now ruled out  #2 acute respiratory failure with acute respiratory acidosis and hypercapnia secondary to pneumonia and COPD exacerbation -Initial ABG  revealed pH 6.99 and PCO2 at 160 -gas improved after intubation.---> now extubated (10/5) -BIPAP prn and qhs. Pt does not tolerate CPAP well -on 2 liter [place] -completed abx treatment  #3 right upper lobe spiculated lung mass  Oncology is consulted-appreciated Dr Metro Kung note-->w/u as out pt. Will need PET scan  #4 sepsis secondary to right upper lobe pneumonia -completed rx with  Azithromycin -BC negative  #5 acute exacerbation of COPD-stable -prn nebs -off steroids  #6 hyponatremia could be from SIADH secondary to problem #3 IV 3% NS (10/1) now d/ced Sodium is slightly better from 118--> 124--->134 Nephrology consult noted CT head negative  #7 History of hypothyroidism Continue levothyroxin  #8 Nutrition Start po diet  #9 Ileus -get KUB -pt tolerating po diet -stool softners. -needs to get up and be around. PT consulted--->recommends rehab  To rehab once bed available csw for d/c planning  All the records are reviewed and case discussed with Care Management/Social Worker. D/w wife in  theroom  CODE STATUS: Full code  TOTAL critical care TIME TAKING CARE OF THIS PATIENT: 40 minutes. More than 50% time was spent in coordination of care, discussing with family D/w Dr Janey Genta M.D on 07/03/2015   Between 7am to 6pm - Pager - [phone] After 6pm go to [email or web address] - password [email or web address] [place] [facility]  Office  [phone]  CC: Primary care physician; [name], MD

## 2015-07-03 NOTE — Clinical Social Work Note (Signed)
Clinical Social Work Assessment  Patient Details  Name: Aaron Hendrix MRN: [id number] Date of Birth: 02/22/1930  Date of referral:  07/03/15               Reason for consult:  Facility Placement                Permission sought to share information with:  Chartered certified accountant granted to share information::  Yes, Verbal Permission Granted  Name::      Bertha::   [place]   Relationship::     Contact Information:     Housing/Transportation Living arrangements for the past 2 months:  Sisters of Information:  Patient, Spouse Patient Interpreter Needed:  None Criminal Activity/Legal Involvement Pertinent to Current Situation/Hospitalization:  No - Comment as needed Significant Relationships:  Adult Children, Spouse Lives with:  Spouse Do you feel safe going back to the place where you live?  Yes Need for family participation in patient care:  Yes (Comment)  Care giving concerns: Patient lives with his wife Herbert Pun and son in [place].    Social Worker assessment / plan: Holiday representative ([place]) received verbal consult that PT is recommending SNF. Patient is requiring Bipap at night. CSW met with patient and his Herbert Pun was at bedside. Patient was laying in bed and alert and oriented. CSW introduced self and explained role of CSW department. Patient reported that he lives with his wife and son [phone]. Wife reported that their daughter Delphia Grates [phone] was very involved and helped make decisions. CSW explained that PT is recommending SNF. Wife and patient are agreeable to SNF search. Wife prefers [facility] Per Kim admissions coordinator at [facility] there are no male beds available. CSW also explained that the SNF will have to order the Bi-pap and meet patient's needs. Wife verbalized her understanding.   FL2 complete and on chart.   Employment status:  Disabled (Comment on whether or not  currently receiving Disability), Retired Nurse, adult PT Recommendations:  [place] / Referral to community resources:  [place]  Patient/Family's Response to care: Patient and wife are agreeable to [employer].  Patient/Family's Understanding of and Emotional Response to Diagnosis, Current Treatment, and Prognosis: Patient and wife were pleasant throughout assessment and thanked CSW for visit.   Emotional Assessment Appearance:    Attitude/Demeanor/Rapport:    Affect (typically observed):  Accepting, Adaptable, Pleasant Orientation:  Oriented to Self, Oriented to Place, Oriented to  Time, Oriented to Situation Alcohol / Substance use:  Not Applicable Psych involvement (Current and /or in the community):  No (Comment)  Discharge Needs  Concerns to be addressed:  Discharge Planning Concerns Readmission within the last 30 days:  No Current discharge risk:  Chronically ill Barriers to Discharge:  Continued Medical Work up   Loralyn Freshwater, LCSW 07/03/2015, 3:59 PM

## 2015-07-04 LAB — GLUCOSE, CAPILLARY
GLUCOSE-CAPILLARY: 115 mg/dL — AB (ref 65–99)
GLUCOSE-CAPILLARY: 98 mg/dL (ref 65–99)
GLUCOSE-CAPILLARY: 133 mg/dL — AB (ref 65–99)
Glucose-Capillary: 253 mg/dL — ABNORMAL HIGH (ref 65–99)

## 2015-07-04 NOTE — Progress Notes (Addendum)
MD Mortimer Fries was called to verify concerns about bipap at night. Changed orders to qhs. Resp was updated on order changes. MD Verdell Carmine was asked about orders as well. Adriene night RN was questioned about what happen with continous pulse ox. Daugther/ son were updated.

## 2015-07-04 NOTE — Progress Notes (Signed)
2 L of oxygen. NSR. FS are stable. Up to chair and tolerated it well. Wife at the bedside. Pt report no pain. Multi loose stools due to senna. Takes meds ok. Pt has no further concerns at this time.

## 2015-07-04 NOTE — Progress Notes (Signed)
Each morning to the [facility] at [place] NAME: Aaron Hendrix    MR#:  [id number]  DATE OF BIRTH:  [birth year]  SUBJECTIVE:   No acute complaints presently. Tolerating by mouth well. Had a BM yesterday. Wife at bedside.  REVIEW OF SYSTEMS:  Review of systems unobtainable DRUG ALLERGIES:   Allergies  Allergen Reactions  . Fexofenadine Other (See Comments)  . Quinapril Hcl Other (See Comments)    Unknown   . Latex Rash    VITALS:  Blood pressure 112/47, pulse 77, temperature 98.3 F (36.8 C), temperature source Oral, resp. rate 20, height '5\' 7"'$  (1.702 m), weight 66.996 kg (147 lb 11.2 oz), SpO2 100 %.  PHYSICAL EXAMINATION:  GENERAL:  81 y.o.-year-old patient lying in the bed with no acute distress.  EYES: Pupils equal, round, reactive to light and accommodation. No scleral icterus.  HEENT: Head atraumatic, normocephalic. No oropharyngeal erythema.  NECK:  Supple, no jugular venous distention. No thyroid enlargement, no tenderness.  LUNGS: No use of accessory muscles. No rales, rhonchi, wheezes. No dullness to percussion. CARDIOVASCULAR: S1, S2 normal. No murmurs, rubs, or gallops. ABDOMEN: Soft, nontender, slightly distended. Positive Bowel sounds . No organomegaly or mass.  EXTREMITIES: No pedal edema, cyanosis, or clubbing.  NEUROLOGIC: No focal motor or sensory deficit history bilaterally. PSYCHIATRIC: alert and oriented X 3. Good affect.  SKIN: No obvious rash, lesion, or ulcer.   LABORATORY PANEL:   CBC  Recent Labs Lab 07/02/15 0459  WBC 15.5*  HGB 12.9*  HCT 40.9  PLT 310   ------------------------------------------------------------------------------------------------------------------  Chemistries   Recent Labs Lab 07/02/15 0459  NA 140  K 3.7  CL 97*  CO2 37*  GLUCOSE 144*  BUN 40*  CREATININE 0.87  CALCIUM 9.1    ------------------------------------------------------------------------------------------------------------------  Cardiac Enzymes No results for input(s): TROPONINI in the last 168 hours. ------------------------------------------------------------------------------------------------------------------  RADIOLOGY:  No results found.   ASSESSMENT AND PLAN:   Aaron Hendrix is a 81 y.o. male with a known history of COPD, irritable bowel syndrome, hypertension, mild dementia presents from home secondary to altered mental status.  #1 altered mental status-likely secondary to acute hypercarbic hypoxic respiratory failure,sepsis and hyponatremia. - CT head normal -Mental status much improved and back to baseline. -Initial ABG has revealed acute respiratory acidosis with hypercapnia which has improved now.  -BC negatvie -sepsis now ruled out  #2 acute respiratory failure with acute respiratory acidosis and hypercapnia secondary to pneumonia and COPD exacerbation -Initial ABG  revealed pH 6.99 and PCO2 at 160 -gas improved after intubation.---> now extubated (10/5) -BIPAP prn and qhs.  -Clinically doing well just on 2 L nasal cannula.  #3 right upper lobe spiculated lung mass  Oncology is consulted-appreciated Dr Metro Kung note-->w/u as out pt. Will need PET scan  #4 sepsis secondary to right upper lobe pneumonia -completed rx with  Azithromycin -BC negative  #5 acute exacerbation of COPD-stable -prn nebs -off steroids  #6 hyponatremia could be from SIADH secondary to problem #3 -IV 3% NS (10/1) now d/ced -Sodium much improved from 118--> 124--->134-->140 -Appreciate nephrology input  #7 History of hypothyroidism-continue Synthroid  #8 Ileus-resolved. Patient had a BM yesterday. Tolerating regular diet well. Abdomen is soft and slightly distended.  To rehab once bed available csw for d/c planning  All the records are reviewed and case discussed with Care  Management/Social Worker. D/w wife in theroom  CODE STATUS: Full code  TOTAL critical care TIME TAKING CARE OF THIS  PATIENT: 30 minutes.   Henreitta Leber M.D on 07/04/2015   Between 7am to 6pm - Pager - [phone] After 6pm go to [email or web address] - password [email or web address] [place] [facility]  Office  [phone]  CC: Primary care physician; [name], MD

## 2015-07-04 NOTE — Progress Notes (Signed)
Clinical Education officer, museum (CSW) met with patient and his wife at bedside and presented SNF offers. Patient and wife reported that they wanted patient to come home. CSW also spoke with patient's daughter [name] via telephone. Daughter reported that patient becomes more confused in new environments and she would like for patient to come home. Per daughter they want [facility] with PT, RN and aide. Daughter also reported that RN Case Manager is working on patient's Bi-pap being set up at home. CSW made weekend RN Case Manager aware of above. CSW will continue to follow and assist as needed.   Blima Rich, [place] [phone]

## 2015-07-04 NOTE — Progress Notes (Addendum)
Pt. Occasionally took off pulse ox. Pulse ox was reapplied and pt was educated on the importance of keeping it on.  Pt is on 2L of O2 because he was maintaining O2 levels in the low 80's.

## 2015-07-05 LAB — BASIC METABOLIC PANEL
Anion gap: 6 (ref 5–15)
BUN: 25 mg/dL — AB (ref 6–20)
CALCIUM: 8.5 mg/dL — AB (ref 8.9–10.3)
CO2: 39 mmol/L — ABNORMAL HIGH (ref 22–32)
CREATININE: 0.9 mg/dL (ref 0.61–1.24)
Chloride: 95 mmol/L — ABNORMAL LOW (ref 101–111)
GFR calc Af Amer: >60 mL/min (ref >60)
GLUCOSE: 102 mg/dL — AB (ref 65–99)
Potassium: 3 mmol/L — ABNORMAL LOW (ref 3.5–5.1)
SODIUM: 140 mmol/L (ref 135–145)

## 2015-07-05 LAB — GLUCOSE, CAPILLARY
GLUCOSE-CAPILLARY: 86 mg/dL (ref 65–99)
GLUCOSE-CAPILLARY: 251 mg/dL — AB (ref 65–99)
Glucose-Capillary: 138 mg/dL — ABNORMAL HIGH (ref 65–99)
Glucose-Capillary: 143 mg/dL — ABNORMAL HIGH (ref 65–99)

## 2015-07-05 LAB — CBC
HCT: 35.8 % — ABNORMAL LOW (ref 40.0–52.0)
Hemoglobin: 11.4 g/dL — ABNORMAL LOW (ref 13.0–18.0)
MCH: 30.3 pg (ref 26.0–34.0)
MCHC: 31.9 g/dL — AB (ref 32.0–36.0)
MCV: 94.9 fL (ref 80.0–100.0)
PLATELETS: 272 10*3/uL (ref 150–440)
RBC: 3.77 MIL/uL — AB (ref 4.40–5.90)
RDW: 13.2 % (ref 11.5–14.5)
WBC: 8.7 10*3/uL (ref 3.8–10.6)

## 2015-07-05 LAB — C DIFFICILE QUICK SCREEN W PCR REFLEX
C DIFFICILE (CDIFF) TOXIN: NEGATIVE
C Diff antigen: NEGATIVE
C Diff interpretation: NEGATIVE

## 2015-07-05 MED ORDER — POTASSIUM CHLORIDE CRYS ER 20 MEQ PO TBCR
20.0000 meq | EXTENDED_RELEASE_TABLET | Freq: Two times a day (BID) | ORAL | Status: DC
  Administered 2015-07-05 – 2015-07-06 (×3): 20 meq via ORAL
  Filled 2015-07-05 (×4): qty 1

## 2015-07-05 NOTE — Care Management Note (Addendum)
Case Management Note  Patient Details  Name: JAYSION RAMSEYER MRN: [id number] Date of Birth: 05/31/1930  Subjective/Objective:     Received call from Seth Bake at [facility] DME concerning requirements for Mr Carton insurance to pay for him to have home Bipap. Three requirements must be met: 1) A continuous recording oximetry reading at night on 2L N/C during sleep showing that continuous pulse ox drops to 88% or below for 5 minutes or more.  2) A current PACO2 within 24 hours of discharge which must be greater than 52%.  3) Documentation by a physician that OSA and CPAP treatments have been ruled out.  Dr Verdell Carmine has been updated about these requirements and gave this writer a verbal order for a continuous oximetry recording on 2L N/C tonight. This Probation officer notified the [facility] Respiratory Therapy Dept and the charge nurse on 2A, Georga Hacking, RN , about the order for an overnight oximetry reading tonight that must be recorded.                 Action/Plan:   Expected Discharge Date:                  Expected Discharge Plan:  Skilled Nursing Facility  In-House Referral:  Clinical Social Work  Discharge planning Services  CM Consult  Post Acute Care Choice:    Choice offered to:  Spouse, Adult Children  DME Arranged:  Bipap DME Agency:  [place] Arranged:  PT, RN [facility] Agency:  [place]  Status of Service:  In process, will continue to follow  Medicare Important Message Given:  Yes-third notification given Date Medicare IM Given:    Medicare IM give by:    Date Additional Medicare IM Given:    Additional Medicare Important Message give by:     If discussed at [place] of Stay Meetings, dates discussed:    Additional Comments:  Lennart Gladish A, RN 07/05/2015, 2:09 PM

## 2015-07-05 NOTE — Progress Notes (Signed)
Each morning to the [facility] at [place] NAME: Aaron Hendrix    MR#:  [id number]  DATE OF BIRTH:  [birth year]  SUBJECTIVE:   No acute events overnight. Daughter at bedside. Patient ate well this morning. Had a bowel movement this morning. Tolerating his BiPAP overnight.  REVIEW OF SYSTEMS:  Review of systems unobtainable DRUG ALLERGIES:   Allergies  Allergen Reactions  . Fexofenadine Other (See Comments)  . Quinapril Hcl Other (See Comments)    Unknown   . Latex Rash    VITALS:  Blood pressure 139/55, pulse 83, temperature 98.2 F (36.8 C), temperature source Oral, resp. rate 18, height '5\' 7"'$  (1.702 m), weight 68.13 kg (150 lb 3.2 oz), SpO2 97 %.  PHYSICAL EXAMINATION:  GENERAL:  81 y.o.-year-old patient lying in the bed with no acute distress.  EYES: Pupils equal, round, reactive to light and accommodation. No scleral icterus.  HEENT: Head atraumatic, normocephalic. No oropharyngeal erythema.  NECK:  Supple, no jugular venous distention. No thyroid enlargement, no tenderness.  LUNGS: No use of accessory muscles. No rales, rhonchi, wheezes. No dullness to percussion. CARDIOVASCULAR: S1, S2 normal. No murmurs, rubs, or gallops. ABDOMEN: Soft, nontender, slightly distended. Positive Bowel sounds. No organomegaly or mass.  EXTREMITIES: No pedal edema, cyanosis, or clubbing.  NEUROLOGIC: No focal motor or sensory deficit history bilaterally. Globally weak PSYCHIATRIC: alert and oriented X 3. Good affect.  SKIN: No obvious rash, lesion, or ulcer.   LABORATORY PANEL:   CBC  Recent Labs Lab 07/05/15 0647  WBC 8.7  HGB 11.4*  HCT 35.8*  PLT 272   ------------------------------------------------------------------------------------------------------------------  Chemistries   Recent Labs Lab 07/05/15 0647  NA 140  K 3.0*  CL 95*  CO2 39*  GLUCOSE 102*  BUN 25*  CREATININE 0.90  CALCIUM 8.5*    ------------------------------------------------------------------------------------------------------------------  Cardiac Enzymes No results for input(s): TROPONINI in the last 168 hours. ------------------------------------------------------------------------------------------------------------------  RADIOLOGY:  No results found.   ASSESSMENT AND PLAN:   Aaron Hendrix is a 81 y.o. male with a known history of COPD, irritable bowel syndrome, hypertension, mild dementia presents from home secondary to altered mental status.  #1 altered mental status-likely secondary to acute hypercarbic hypoxic respiratory failure,sepsis and hyponatremia. - CT head normal -Mental status much improved and back to baseline. -Initial ABG has revealed acute respiratory acidosis with hypercapnia which has improved now.  -BC negatvie -sepsis now ruled out  #2 acute respiratory failure with acute respiratory acidosis and hypercapnia secondary to pneumonia and COPD exacerbation -Initial ABG  revealed pH 6.99 and PCO2 at 160 -ABG has improved after intubation.---> now extubated (10/5) -cont. BIPAP prn and qhs.  -Clinically doing well just on 2 L nasal cannula.  #3 right upper lobe spiculated lung mass  Oncology is consulted-appreciated Dr Metro Kung note-->w/u as out pt. Will need PET scan  #4 sepsis secondary to right upper lobe pneumonia -completed rx with  Azithromycin -BC negative  #5 acute exacerbation of COPD-stable -prn nebs -off steroids  #6 hyponatremia could be from SIADH secondary to problem #3 -was on IV 3% NS (10/1) now d/ced -Sodium much improved from 118--> 124--->134-->140 -Appreciate nephrology input  #7 History of hypothyroidism-continue Synthroid  #8 Ileus-resolved. Patient had a BM yesterday and today. Tolerating regular diet well. Abdomen is soft and slightly distended.  #9 hypokalemia-we'll start on oral potassium supplements and repeat level in the  morning.  Discussed in detail with daughter at bedside about patient's plan of care.  She does not want the patient going to short-term rehabilitation and would prefer to take the patient home with home health services. This is likely to be arranged for tomorrow in the morning.  All the records are reviewed and case discussed with Care Management/Social Worker. D/w wife in theroom  CODE STATUS: Full code  TOTAL TIME TAKING CARE OF THIS PATIENT: 30 minutes.   Greater than 50% of time spent in coordination of care due to discussion with daughter and family  Henreitta Leber M.D on 07/05/2015   Between 7am to 6pm - Pager - [phone] After 6pm go to [email or web address] - password [email or web address] [facility]  Office  [phone]  CC: Primary care physician; [name], MD

## 2015-07-05 NOTE — Progress Notes (Addendum)
MD Verdell Carmine was notified that K was 3.0. PO order was placed. Will monitor.

## 2015-07-05 NOTE — Progress Notes (Signed)
Arrived to find patient off bipap. rn states patient pulled off hours ago along with central line. Patient seen for svn. Responding appropriately. Patient cooperative at this time. Placed him back on bipap reported findings to rn

## 2015-07-05 NOTE — Care Management Note (Signed)
Case Management Note  Patient Details  Name: OVADIA LOPP MRN: [id number] Date of Birth: 03/23/1930  Subjective/Objective:      Referral and order for home Bipap machine was faxed to [facility]. Per [facility] Respiratory Therapy Dept, Mr Acklin inspiratory pressure was 12 and his expiratory pressure was 6 last night. Anticipate discharge home on Monday with home Bipap machine. Case Management following for discharge planning. Today family is saying that they do not want SNF.              Action/Plan:   Expected Discharge Date:                  Expected Discharge Plan:  Skilled Nursing Facility  In-House Referral:  Clinical Social Work  Discharge planning Services  CM Consult  Post Acute Care Choice:    Choice offered to:  Spouse, Adult Children  DME Arranged:  Bipap DME Agency:  [place] Arranged:  PT, RN [facility] Agency:  [facility]  Status of Service:  In process, will continue to follow  Medicare Important Message Given:  Yes-third notification given Date Medicare IM Given:    Medicare IM give by:    Date Additional Medicare IM Given:    Additional Medicare Important Message give by:     If discussed at [place] of Stay Meetings, dates discussed:    Additional Comments:  Parminder Trapani A, RN 07/05/2015, 12:41 PM

## 2015-07-05 NOTE — Progress Notes (Signed)
NSR. Room air. FS are stable. Pt reports no pain. Takes meds ok. Loose stools. Neg for c diff. K was Po replaced. Pt up to [facility] and tolerated it well. Pt has no further concerns at this time.

## 2015-07-05 NOTE — Progress Notes (Addendum)
Pt is confused, pulled out left jugular central line with minimal bleeding and pulled off BIPAP around 2120. 4 RNs attempted an peripheral IV with no success. MD order for no IV access at this time. No c/o pain, VSS. Impulsive at times.

## 2015-07-06 ENCOUNTER — Telehealth: Payer: Self-pay

## 2015-07-06 ENCOUNTER — Telehealth: Payer: Self-pay | Admitting: Internal Medicine

## 2015-07-06 LAB — GLUCOSE, CAPILLARY
GLUCOSE-CAPILLARY: 110 mg/dL — AB (ref 65–99)
Glucose-Capillary: 110 mg/dL — ABNORMAL HIGH (ref 65–99)
Glucose-Capillary: 186 mg/dL — ABNORMAL HIGH (ref 65–99)

## 2015-07-06 LAB — BLOOD GAS, ARTERIAL
Acid-Base Excess: 12 mmol/L — ABNORMAL HIGH (ref 0.0–3.0)
Acid-Base Excess: 13.8 mmol/L — ABNORMAL HIGH (ref 0.0–3.0)
BICARBONATE: 40.7 meq/L — AB (ref 21.0–28.0)
Bicarbonate: 40 mEq/L — ABNORMAL HIGH (ref 21.0–28.0)
FIO2: 0.28
FIO2: 0.28
O2 SAT: 94.3 %
O2 Saturation: 97.9 %
PATIENT TEMPERATURE: 37
PCO2 ART: 72 mmHg — AB (ref 32.0–48.0)
PEEP/CPAP: 5 cmH2O
PO2 ART: 75 mmHg — AB (ref 83.0–108.0)
PRESSURE SUPPORT: 10 cmH2O
Patient temperature: 37
pCO2 arterial: 55 mmHg — ABNORMAL HIGH (ref 32.0–48.0)
pH, Arterial: 7.36 (ref 7.350–7.450)
pH, Arterial: 7.47 — ABNORMAL HIGH (ref 7.350–7.450)
pO2, Arterial: 97 mmHg (ref 83.0–108.0)

## 2015-07-06 LAB — POTASSIUM: Potassium: 3.6 mmol/L (ref 3.5–5.1)

## 2015-07-06 MED ORDER — TIOTROPIUM BROMIDE MONOHYDRATE 18 MCG IN CAPS: 18.0000 ug | ORAL_CAPSULE | Freq: Every day | RESPIRATORY_TRACT | Status: DC

## 2015-07-06 MED ORDER — ALBUTEROL SULFATE (2.5 MG/3ML) 0.083% IN NEBU: 2.5000 mg | INHALATION_SOLUTION | Freq: Four times a day (QID) | RESPIRATORY_TRACT | Status: DC

## 2015-07-06 MED ORDER — AMLODIPINE BESYLATE 5 MG PO TABS: 5.0000 mg | ORAL_TABLET | Freq: Every day | ORAL | Status: DC

## 2015-07-06 MED ORDER — ENSURE ENLIVE PO LIQD: 237.0000 mL | Freq: Two times a day (BID) | ORAL | Status: DC

## 2015-07-06 MED ORDER — MOMETASONE FURO-FORMOTEROL FUM 100-5 MCG/ACT IN AERO: 2.0000 | INHALATION_SPRAY | Freq: Two times a day (BID) | RESPIRATORY_TRACT | Status: DC

## 2015-07-06 MED ORDER — RISAQUAD PO CAPS: 2.0000 | ORAL_CAPSULE | Freq: Every day | ORAL | Status: DC

## 2015-07-06 MED ORDER — METOPROLOL TARTRATE 25 MG PO TABS: 25.0000 mg | ORAL_TABLET | Freq: Two times a day (BID) | ORAL | Status: DC

## 2015-07-06 MED ORDER — CLONIDINE HCL 0.1 MG PO TABS: 0.1000 mg | ORAL_TABLET | Freq: Two times a day (BID) | ORAL | Status: DC

## 2015-07-06 NOTE — Discharge Instructions (Signed)
Use your oxygen as instructed. °

## 2015-07-06 NOTE — Care Management (Signed)
Patient did not qualify for home O2.  Over night home pulse ox ordered.  Order given to Will with [facility].  Tim from [facility] confirmed that patient has been accepted as a new referral.  Nebulizer delivered to patient room.  RNCM signing off.

## 2015-07-06 NOTE — Progress Notes (Signed)
ABG completed. See results. Overnight pulse ox X2 completed. 1st 10/7 2nd 10/9 see patient's chart-cardiopulmonary

## 2015-07-06 NOTE — Telephone Encounter (Signed)
Noted already addressed to schedule appt next week.

## 2015-07-06 NOTE — Progress Notes (Signed)
Pt has been removing O2 finger probe occasionally. Pt only destating on machine once probe removed. Replacing probe as needed

## 2015-07-06 NOTE — Telephone Encounter (Signed)
Will follow up with TCM and schedule appointment on 07/15/15.

## 2015-07-06 NOTE — Telephone Encounter (Signed)
Hospital follow up pt is being discharged today. Diagnosis is Hyponatremia. No avail appt to sch. Please let me know where and I can schedule appt. Thank You!

## 2015-07-06 NOTE — Progress Notes (Signed)
Discharge instructions along with home medication list and follow up gone over with patient, wife and daughter. Made daughter aware rx have been sent to [facility]. Personal nebulizer at bedside. Patient to be discharged on on ra. No distress noted. No c/o pain. Iv and telemetry removed.

## 2015-07-06 NOTE — Telephone Encounter (Signed)
Will follow up with TCM call and schedule accordingly.

## 2015-07-06 NOTE — Discharge Summary (Signed)
[facility] at [place] NAME: Aaron Hendrix    MR#:  [id number]  DATE OF BIRTH:  June 23, 1930  DATE OF ADMISSION:  06/25/2015 ADMITTING PHYSICIAN: Gladstone Lighter, MD  DATE OF DISCHARGE: 07/06/15  PRIMARY CARE PHYSICIAN: [name], MD    ADMISSION DIAGNOSIS:  Hyponatremia [E87.1] Altered mental status [R41.82] Community acquired pneumonia [J18.9] Generalized weakness [R53.1]  DISCHARGE DIAGNOSIS:  Acute Encephalopathy-metabolic Acute on chronic hypoxic respiratory failure due to COPD flare and pneumonia -rightlung mass -out pt f/u dr Oliva Bustard Sepsis due to penumonia-resolved  SECONDARY DIAGNOSIS:   Past Medical History  Diagnosis Date  . COPD with asthma   . Diabetes mellitus     Diet control   . Hyperlipidemia   . Carpal tunnel syndrome   . DJD (degenerative joint disease), cervical   . Vitamin D deficiency   . Tremor, essential   . Internal hemorrhoids   . Personal history of colonic polyps     adenomatous  . IBS (irritable bowel syndrome)   . Peptic ulcer   . Duodenal ulcer, with partial obstruction 08/10/2012  . Numbness and tingling in right hand     started 2 yeas ago  . Hypertension     no medicine needed  . Memory deficit 12/16/2013  . Essential and other specified forms of tremor 12/16/2013  . Polyneuropathy in diabetes(357.2)     HOSPITOURSE:   81 y.o. male with a known history of COPD, irritable bowel syndrome, hypertension, mild dementia presents from home secondary to altered mental status.  #1 altered mental status-likely secondary to acute hypercarbic hypoxic respiratory failure,sepsis and hyponatremia. - CT head normal -Mental status much improved and back to baseline. -Initial ABG has revealed acute respiratory acidosis with hypercapnia which has improved now.  -BC negatvie -sepsis now ruled out  #2 acute respiratory failure with acute respiratory acidosis and hypercapnia secondary to  pneumonia and COPD exacerbation -Initial ABG revealed pH 6.99 and PCO2 at 160 -ABG has improved after intubation.---> now extubated (10/5) -cont. BIPAP prn and qhs.  -Clinically doing well just on 2 L nasal cannula. -will need home oxygen -spoke with dter [name] about getting official sleep study as out pt for BIPAP thru out pt pulmonary service  #3 right upper lobe spiculated lung mass  Oncology is consulted-appreciated Dr Metro Kung note-->w/u as out pt. Will need PET scan  #4 sepsis secondary to right upper lobe pneumonia -completed rx with Azithromycin -BC negative  #5 acute exacerbation of COPD-stable -prn nebs -off steroids  #6 hyponatremia could be from SIADH secondary to problem #3 -was on IV 3% NS (10/1) now d/ced -Sodium much improved from 118--> 124--->134-->140 -Appreciate nephrology input  #7 History of hypothyroidism-continue Synthroid  #8 Ileus-resolved. Patient had a BM yesterday and today. Tolerating regular diet well. Abdomen is soft and slightly distended.  #9 hypokalemia-we'll start on oral potassium supplements and repeat level in the morning.  Discussed in detail with daughter at bedside about patient's plan of care. She does not want the patient going to short-term rehabilitation and would prefer to take the patient home with home health services.  D/c home later today with HHRN/PT  CONSULTS OBTAINED:  Treatment Team:  Vilinda Boehringer, MD Leia Alf, MD  DRUG ALLERGIES:   Allergies  Allergen Reactions  . Fexofenadine Other (See Comments)  . Quinapril Hcl Other (See Comments)    Unknown   . Latex Rash    DISCHARGE MEDICATIONS:   Current Discharge Medication List  START taking these medications   Details  acidophilus (RISAQUAD) CAPS capsule Take 2 capsules by mouth daily. Qty: 60 capsule, Refills: 0    albuterol (PROVENTIL) (2.5 MG/3ML) 0.083% nebulizer solution Take 3 mLs (2.5 mg total) by nebulization every 6 (six) hours. Qty: 75  mL, Refills: 12    amLODipine (NORVASC) 5 MG tablet Take 1 tablet (5 mg total) by mouth daily. Qty: 30 tablet, Refills: 0    cloNIDine (CATAPRES) 0.1 MG tablet Take 1 tablet (0.1 mg total) by mouth 2 (two) times daily. Qty: 60 tablet, Refills: 11    feeding supplement, ENSURE ENLIVE, (ENSURE ENLIVE) LIQD Take 237 mLs by mouth 2 (two) times daily between meals. Qty: 237 mL, Refills: 12    metoprolol tartrate (LOPRESSOR) 25 MG tablet Take 1 tablet (25 mg total) by mouth 2 (two) times daily. Qty: 60 tablet, Refills: 0    mometasone-formoterol (DULERA) 100-5 MCG/ACT AERO Inhale 2 puffs into the lungs 2 (two) times daily. Qty: 1 Inhaler, Refills: 0    tiotropium (SPIRIVA) 18 MCG inhalation capsule Place 1 capsule (18 mcg total) into inhaler and inhale daily. Qty: 30 capsule, Refills: 12      CONTINUE these medications which have NOT CHANGED   Details  acetaminophen (TYLENOL) 500 MG tablet Take 500 mg by mouth every 6 (six) hours as needed.    Docusate Calcium (STOOL SOFTENER PO) Take by mouth.    ipratropium (ATROVENT) 0.06 % nasal spray Place 2 sprays into the nose daily as needed.     levothyroxine (SYNTHROID, LEVOTHROID) 50 MCG tablet Take 50 mcg by mouth daily before breakfast.    loratadine (CLARITIN) 10 MG tablet Take 10 mg by mouth daily.    pantoprazole (PROTONIX) 40 MG tablet TAKE 1 TABLET BY MOUTH ONCE DAILY, 30-60MINUTES BEFORE A MEAL. Qty: 30 tablet, Refills: 5    traMADol (ULTRAM) 50 MG tablet Take by mouth at bedtime as needed.    Vitamin D, Ergocalciferol, (DRISDOL) 50000 UNITS CAPS capsule Take 50,000 Units by mouth every 7 (seven) days. On Friday    saccharomyces boulardii (FLORASTOR) 250 MG capsule Take 250 mg by mouth daily.        If you experience worsening of your admission symptoms, develop shortness of breath, life threatening emergency, suicidal or homicidal thoughts you must seek medical attention immediately by calling 911 or calling your MD  immediately  if symptoms less severe.  You Must read complete instructions/literature along with all the possible adverse reactions/side effects for all the Medicines you take and that have been prescribed to you. Take any new Medicines after you have completely understood and accept all the possible adverse reactions/side effects.   Please note  You were cared for by a hospitalist during your hospital stay. If you have any questions about your discharge medications or the care you received while you were in the hospital after you are discharged, you can call the unit and asked to speak with the hospitalist on call if the hospitalist that took care of you is not available. Once you are discharged, your primary care physician will handle any further medical issues. Please note that NO REFILLS for any discharge medications will be authorized once you are discharged, as it is imperative that you return to your primary care physician (or establish a relationship with a primary care physician if you do not have one) for your aftercare needs so that they can reassess your need for medications and monitor your lab values. Today  SUBJECTIVE   Pleasant confusion. Wife at bedside  VITAL SIGNS:  Blood pressure 149/63, pulse 80, temperature 98.3 F (36.8 C), temperature source Oral, resp. rate 19, height '5\' 7"'$  (1.702 m), weight 69.673 kg (153 lb 9.6 oz), SpO2 98 %.  I/O:   Intake/Output Summary (Last 24 hours) at 07/06/15 1003 Last data filed at 07/06/15 0949  Gross per 24 hour  Intake   1080 ml  Output    400 ml  Net    680 ml    PHYSICAL EXAMINATION:  GENERAL:  81 y.o.-year-old patient lying in the bed with no acute distress.  EYES: Pupils equal, round, reactive to light and accommodation. No scleral icterus. Extraocular muscles intact.  HEENT: Head atraumatic, normocephalic. Oropharynx and nasopharynx clear.  NECK:  Supple, no jugular venous distention. No thyroid enlargement, no tenderness.   LUNGS: Normal breath sounds bilaterally, no wheezing, rales,rhonchi or crepitation. No use of accessory muscles of respiration.  CARDIOVASCULAR: S1, S2 normal. No murmurs, rubs, or gallops.  ABDOMEN: Soft, non-tender, non-distended. Bowel sounds present. No organomegaly or mass.  EXTREMITIES: No pedal edema, cyanosis, or clubbing.  NEUROLOGIC: Cranial nerves II through XII are intact. Muscle strength 5/5 in all extremities. Sensation intact. Gait not checked.  PSYCHIATRIC: The patient is alert and awake SKIN: No obvious rash, lesion, or ulcer.   DATA REVIEW:   CBC   Recent Labs Lab 07/05/15 0647  WBC 8.7  HGB 11.4*  HCT 35.8*  PLT 272    Chemistries   Recent Labs Lab 07/05/15 0647 07/06/15 0346  NA 140  --   K 3.0* 3.6  CL 95*  --   CO2 39*  --   GLUCOSE 102*  --   BUN 25*  --   CREATININE 0.90  --   CALCIUM 8.5*  --     Microbiology Results   Recent Results (from the past 240 hour(s))  Culture, respiratory (NON-Expectorated)     Status: None   Collection Time: 06/26/15 12:26 PM  Result Value Ref Range Status   Specimen Description TRACHEAL ASPIRATE  Final   Special Requests Normal  Final   Gram Stain   Final    FAIR SPECIMEN - 70-80% WBCS FEW WBC SEEN MODERATE GRAM POSITIVE COCCI RARE YEAST    Culture APPEARS TO BE NORMAL FLORA  Final   Report Status 06/29/2015 FINAL  Final  C difficile quick scan w PCR reflex     Status: None   Collection Time: 07/05/15  3:00 AM  Result Value Ref Range Status   C Diff antigen NEGATIVE NEGATIVE Final   C Diff toxin NEGATIVE NEGATIVE Final   C Diff interpretation Negative for C. difficile  Final    RADIOLOGY:  No results found.   Management plans discussed with the patient, family and they are in agreement.  CODE STATUS:     Code Status Orders        Start     Ordered   06/25/15 2015  Full code   Continuous     06/25/15 2014    [facility] Directive Documentation        Most Recent Value   Type of [facility]  Directive  Living will, Healthcare Power of Attorney   Pre-existing out of facility DNR order (yellow form or pink MOST form)     "MOST" Form in Place?        TOTAL TIME TAKING CARE OF THIS PATIENT: 40 minutes.    Bret Vanessen M.D on 07/06/2015 at 10:03  AM  Between 7am to 6pm - Pager - [phone] After 6pm go to [email or web address] - password [email or web address] [place] [facility]  Office  [phone]  CC: Primary care physician; [name], MD

## 2015-07-06 NOTE — Progress Notes (Signed)
SATURATION QUALIFICATIONS: (This note is used to comply with regulatory documentation for home oxygen)  Patient Saturations on Room Air at Rest = 92%  Patient Saturations on Room Air while Ambulating = 97%    Please briefly explain why patient needs home oxygen:

## 2015-07-06 NOTE — Care Management Note (Signed)
Case Management Note  Patient Details  Name: MERIK MIGNANO MRN: [id number] Date of Birth: 04-09-1930  Subjective/Objective:                 Patient to be discharged today.  Patient has refused SNF placement.  Ordered placed for Home health, RN, Aide, PT, and SW.  Agency choice given to patient, wife, and adult daughter.  [facility] selected.  Corliss Blacker with [name] notified of referral.  Nebulizer ordered for home use.  Will from [facility] notified.  Action/Plan: Awaiting nursing to obtain O2 sats to see if patient qualifies for home O2  Expected Discharge Date:                  Expected Discharge Plan:  [place]  In-House Referral:  Clinical Social Work  Discharge planning Services  CM Consult  Post Acute Care Choice:  Home Health Choice offered to:  Spouse, Adult Children, Patient  DME Arranged:    DME Agency:     HH Arranged:  PT, RN, Nurse's Aide, Social Work [employer] Agency:  [facility]  Status of Service:  In process, will continue to follow  Medicare Important Message Given:  Yes-fourth notification given Date Medicare IM Given:    Medicare IM give by:    Date Additional Medicare IM Given:    Additional Medicare Important Message give by:     If discussed at [place] of Stay Meetings, dates discussed:    Additional Comments:  Beverly Sessions, RN 07/06/2015, 11:40 AM

## 2015-07-07 ENCOUNTER — Telehealth: Payer: Self-pay

## 2015-07-07 NOTE — Telephone Encounter (Signed)
Transition Care Management Follow-up Telephone Call   Date discharged? 07/06/15   How have you been since you were released from the hospital? Feeling good. A little weak, but feeling good.   Do you understand why you were in the hospital? Yes, I think so, but it was a good place to be.    Dx explained to the patient.   Do you understand the discharge instructions? Yes and my wife and daughter was there and help me.   Where were you discharged to? Home   Items Reviewed:  Medications reviewed: Yes  Allergies reviewed: Yes  Dietary changes reviewed: Yes  Referrals reviewed: Yes   Functional Questionnaire:   Activities of Daily Living (ADLs):   He states they are independent in the following: Self feed, grooming States they require assistance with the following: Ambulating (uses walker), bathing, grooming, meal preparation.  Home health and wife helps.   Any transportation issues/concerns?: No. Family is helping.   Any patient concerns? Not at this time.   Confirmed importance and date/time of follow-up visits scheduled Yes, appointment scheduled 07/15/15.  Provider Appointment booked with Dr. Nicki Reaper (PCP)  Confirmed with patient if condition begins to worsen call PCP or go to the ER.  Patient was given the office number and encouraged to call back with question or concerns.  : Yes, verbalized understanding.

## 2015-07-08 ENCOUNTER — Telehealth: Payer: Self-pay | Admitting: *Deleted

## 2015-07-08 DIAGNOSIS — G4719 Other hypersomnia: Secondary | ICD-10-CM

## 2015-07-08 NOTE — Telephone Encounter (Signed)
-----   Message from Laverle Hobby, MD sent at 07/06/2015  1:36 PM EDT ----- Regarding: follow up Pt. Being discharged today.  Pt needs home bipap, please refer pt for lab sleep study asap. Will need follow up with me in 2 to 3 weeks.

## 2015-07-08 NOTE — Telephone Encounter (Signed)
Spoke with wife and scheduled f/u appt. Also informed pt will be set up for sleep study.

## 2015-07-09 ENCOUNTER — Telehealth: Payer: Self-pay | Admitting: Internal Medicine

## 2015-07-09 NOTE — Telephone Encounter (Signed)
Merry Proud called from [facility] regarding needing verbal order for skilled nursing for disease management,Pt for gait training and strengthening,social worker for outside resources, and home health aid bathing. Merry Proud number is [phone]. Thank You!

## 2015-07-09 NOTE — Telephone Encounter (Signed)
SPoke with Merry Proud and a form will be faxed over for Dr. Nicki Reaper to sign.

## 2015-07-10 ENCOUNTER — Telehealth: Payer: Self-pay | Admitting: Internal Medicine

## 2015-07-10 NOTE — Telephone Encounter (Signed)
Aaron Hendrix called from [facility] regarding pt is urinating frequently. Daughter wanted to know if we can prescribed Flomax? Pharmacy is [facility]. Also want to know if Melentonin can be prescribed for him to rest at night? Aaron Hendrix number is [place]!

## 2015-07-10 NOTE — Telephone Encounter (Signed)
Since he was just in the hospital, I would like to make sure no infection present before just prescribing flomax.  Since Friday pm, would recommend acute care - to confirm no infection present.  Urination may be affecting sleep.

## 2015-07-10 NOTE — Telephone Encounter (Signed)
I want him evaluated before calling in a medication.  He also has an appt with me (I think next week)

## 2015-07-10 NOTE — Telephone Encounter (Signed)
Attempted to call Dellis Filbert back, unable to leave a message.  I did call and talk to the patient's wife, stated that patient is doing okay.  That they were waiting for [place] to call in a medication.  I explained that I needed to talk to [place] first.

## 2015-07-10 NOTE — Telephone Encounter (Signed)
Please advise 

## 2015-07-12 ENCOUNTER — Encounter: Payer: Self-pay | Admitting: Emergency Medicine

## 2015-07-12 ENCOUNTER — Emergency Department
Admission: EM | Admit: 2015-07-12 | Discharge: 2015-07-12 | Disposition: A | Discharge status: Home / Self Care | Payer: Medicare Other | Attending: Emergency Medicine | Admitting: Emergency Medicine

## 2015-07-12 DIAGNOSIS — Z79899 Other long term (current) drug therapy: Secondary | ICD-10-CM | POA: Diagnosis not present

## 2015-07-12 DIAGNOSIS — E119 Type 2 diabetes mellitus without complications: Secondary | ICD-10-CM | POA: Diagnosis not present

## 2015-07-12 DIAGNOSIS — R350 Frequency of micturition: Secondary | ICD-10-CM | POA: Diagnosis not present

## 2015-07-12 DIAGNOSIS — R609 Edema, unspecified: Secondary | ICD-10-CM | POA: Insufficient documentation

## 2015-07-12 DIAGNOSIS — Z87891 Personal history of nicotine dependence: Secondary | ICD-10-CM | POA: Insufficient documentation

## 2015-07-12 DIAGNOSIS — I10 Essential (primary) hypertension: Secondary | ICD-10-CM | POA: Insufficient documentation

## 2015-07-12 DIAGNOSIS — Z91040 Latex allergy status: Secondary | ICD-10-CM | POA: Insufficient documentation

## 2015-07-12 DIAGNOSIS — Z7951 Long term (current) use of inhaled steroids: Secondary | ICD-10-CM | POA: Insufficient documentation

## 2015-07-12 DIAGNOSIS — Z85118 Personal history of other malignant neoplasm of bronchus and lung: Secondary | ICD-10-CM | POA: Diagnosis not present

## 2015-07-12 LAB — URINALYSIS COMPLETE WITH MICROSCOPIC (ARMC ONLY)
BILIRUBIN URINE: NEGATIVE
Bacteria, UA: NONE SEEN
Glucose, UA: NEGATIVE mg/dL
HGB URINE DIPSTICK: NEGATIVE
KETONES UR: NEGATIVE mg/dL
Leukocytes, UA: NEGATIVE
NITRITE: NEGATIVE
Protein, ur: 30 mg/dL — AB
SPECIFIC GRAVITY, URINE: 1.006 (ref 1.005–1.030)
Squamous Epithelial / LPF: NONE SEEN
pH: 7 (ref 5.0–8.0)

## 2015-07-12 LAB — CBC WITH DIFFERENTIAL/PLATELET
Basophils Absolute: 0.1 10*3/uL (ref 0–0.1)
Basophils Relative: 1 %
EOS ABS: 0.1 10*3/uL (ref 0–0.7)
EOS PCT: 2 %
HCT: 40.3 % (ref 40.0–52.0)
Hemoglobin: 12.8 g/dL — ABNORMAL LOW (ref 13.0–18.0)
LYMPHS ABS: 1 10*3/uL (ref 1.0–3.6)
LYMPHS PCT: 15 %
MCH: 30.4 pg (ref 26.0–34.0)
MCHC: 31.8 g/dL — AB (ref 32.0–36.0)
MCV: 95.4 fL (ref 80.0–100.0)
MONOS PCT: 9 %
Monocytes Absolute: 0.7 10*3/uL (ref 0.2–1.0)
Neutro Abs: 5.1 10*3/uL (ref 1.4–6.5)
Neutrophils Relative %: 73 %
PLATELETS: 280 10*3/uL (ref 150–440)
RBC: 4.22 MIL/uL — AB (ref 4.40–5.90)
RDW: 13.6 % (ref 11.5–14.5)
WBC: 7 10*3/uL (ref 3.8–10.6)

## 2015-07-12 LAB — BASIC METABOLIC PANEL
Anion gap: 7 (ref 5–15)
BUN: 10 mg/dL (ref 6–20)
CHLORIDE: 93 mmol/L — AB (ref 101–111)
CO2: 34 mmol/L — ABNORMAL HIGH (ref 22–32)
CREATININE: 0.81 mg/dL (ref 0.61–1.24)
Calcium: 9.1 mg/dL (ref 8.9–10.3)
GFR calc Af Amer: >60 mL/min (ref >60)
GLUCOSE: 152 mg/dL — AB (ref 65–99)
POTASSIUM: 3.9 mmol/L (ref 3.5–5.1)
SODIUM: 134 mmol/L — AB (ref 135–145)

## 2015-07-12 MED ORDER — ZOLPIDEM TARTRATE ER 12.5 MG PO TBCR
12.5000 mg | EXTENDED_RELEASE_TABLET | Freq: Every evening | ORAL | Status: DC | PRN
Start: 2015-07-12 — End: 2015-07-13

## 2015-07-12 MED ORDER — PHENAZOPYRIDINE HCL 100 MG PO TABS
100.0000 mg | ORAL_TABLET | Freq: Once | ORAL | Status: AC
  Administered 2015-07-12: 100 mg via ORAL

## 2015-07-12 MED ORDER — PHENAZOPYRIDINE HCL 100 MG PO TABS: 100.0000 mg | ORAL_TABLET | Freq: Two times a day (BID) | ORAL | Status: DC

## 2015-07-12 MED ORDER — PHENAZOPYRIDINE HCL 200 MG PO TABS
ORAL_TABLET | ORAL | Status: AC
  Filled 2015-07-12: qty 1

## 2015-07-12 NOTE — Discharge Instructions (Signed)
The urine tests look good with no sign of infection. Potassium and sodium looked good. Blood count and renal function looks good. A bladder scan was performed which showed a reasonable amount of urine in the bladder (approximately 200 mL's). Take Pyridium, twice a day tomorrow. Follow-up with her regular doctor early this week. You requested a medication to help with sleep. Take Ambien, 5 mg, if needed. This is intended as a short-term treatment. Please discuss ongoing treatment of insomnia with your primary physician. Return to emergency department if there is any fever, increasing somnolence over, or other urgent concerns.  Urinary Frequency The number of times a normal person urinates depends upon how much liquid they take in and how much liquid they are losing. If the temperature is hot and there is high humidity, then the person will sweat more and usually breathe a little more frequently. These factors decrease the amount of frequency of urination that would be considered normal. The amount you drink is easily determined, but the amount of fluid lost is sometimes more difficult to calculate.  Fluid is lost in two ways:  Sensible fluid loss is usually measured by the amount of urine that you get rid of. Losses of fluid can also occur with diarrhea.  Insensible fluid loss is more difficult to measure. It is caused by evaporation. Insensible loss of fluid occurs through breathing and sweating. It usually ranges from a little less than a quart to a little more than a quart of fluid a day. In normal temperatures and activity levels, the average person may urinate 4 to 7 times in a 24-hour period. Needing to urinate more often than that could indicate a problem. If one urinates 4 to 7 times in 24 hours and has large volumes each time, that could indicate a different problem from one who urinates 4 to 7 times a day and has small volumes. The time of urinating is also important. Most urinating should be  done during the waking hours. Getting up at night to urinate frequently can indicate some problems. CAUSES  The bladder is the organ in your lower abdomen that holds urine. Like a balloon, it swells some as it fills up. Your nerves sense this and tell you it is time to head for the bathroom. There are a number of reasons that you might feel the need to urinate more often than usual. They include:  Urinary tract infection. This is usually associated with other signs such as burning when you urinate.  In men, problems with the prostate (a walnut-size gland that is located near the tube that carries urine out of your body). There are two reasons why the prostate can cause an increased frequency of urination:  An enlarged prostate that does not let the bladder empty well. If the bladder only half empties when you urinate, then it only has half the capacity to fill before you have to urinate again.  The nerves in the bladder become more hypersensitive with an increased size of the prostate even if the bladder empties completely.  Pregnancy.  Obesity. Excess weight is more likely to cause a problem for women than for men.  Bladder stones or other bladder problems.  Caffeine.  Alcohol.  Medications. For example, drugs that help the body get rid of extra fluid (diuretics) increase urine production. Some other medicines must be taken with lots of fluids.  Muscle or nerve weakness. This might be the result of a spinal cord injury, a stroke, multiple sclerosis,  or Parkinson disease.  Long-standing diabetes can decrease the sensation of the bladder. This loss of sensation makes it harder to sense the bladder needs to be emptied. Over a period of years, the bladder is stretched out by constant overfilling. This weakens the bladder muscles so that the bladder does not empty well and has less capacity to fill with new urine.  Interstitial cystitis (also called painful bladder syndrome). This condition  develops because the tissues that line the inside of the bladder are inflamed (inflammation is the body's way of reacting to injury or infection). It causes pain and frequent urination. It occurs in women more often than in men. DIAGNOSIS   To decide what might be causing your urinary frequency, your health care provider will probably:  Ask about symptoms you have noticed.  Ask about your overall health. This will include questions about any medications you are taking.  Do a physical examination.  Order some tests. These might include:  A blood test to check for diabetes or other health issues that could be contributing to the problem.  Urine testing. This could measure the flow of urine and the pressure on the bladder.  A test of your neurological system (the brain, spinal cord, and nerves). This is the system that senses the need to urinate.  A bladder test to check whether it is emptying completely when you urinate.  Cystoscopy. This test uses a thin tube with a tiny camera on it. It offers a look inside your urethra and bladder to see if there are problems.  Imaging tests. You might be given a contrast dye and then asked to urinate. X-rays are taken to see how your bladder is working. TREATMENT  It is important for you to be evaluated to determine if the amount or frequency that you have is unusual or abnormal. If it is found to be abnormal, the cause should be determined and this can usually be found out easily. Depending upon the cause, treatment could include medication, stimulation of the nerves, or surgery. There are not too many things that you can do as an individual to change your urinary frequency. It is important that you balance the amount of fluid intake needed to compensate for your activity and the temperature. Medical problems will be diagnosed and taken care of by your physician. There is no particular bladder training such as Kegel exercises that you can do to help  urinary frequency. This is an exercise that is usually recommended for people who have leaking of urine when they laugh, cough, or sneeze. HOME CARE INSTRUCTIONS   Take any medications your health care provider prescribed or suggested. Follow the directions carefully.  Practice any lifestyle changes that are recommended. These might include:  Drinking less fluid or drinking at different times of the day. If you need to urinate often during the night, for example, you may need to stop drinking fluids early in the evening.  Cutting down on caffeine or alcohol. They both can make you need to urinate more often than normal. Caffeine is found in coffee, tea, and sodas.  Losing weight, if that is recommended.  Keep a journal or a log. You might be asked to record how much you drink and when and where you feel the need to urinate. This will also help evaluate how well the treatment provided by your physician is working. SEEK MEDICAL CARE IF:   Your need to urinate often gets worse.  You feel increased pain or irritation  when you urinate.  You notice blood in your urine.  You have questions about any medications that your health care provider recommended.  You notice blood, pus, or swelling at the site of any test or treatment procedure.  You develop a fever of more than 100.35F (38.1C). SEEK IMMEDIATE MEDICAL CARE IF:  You develop a fever of more than 102.40F (38.9C).   This information is not intended to replace advice given to you by your health care provider. Make sure you discuss any questions you have with your health care provider.   Document Released: 07/09/2009 Document Revised: 10/03/2014 Document Reviewed: 07/09/2009 Elsevier Interactive Patient Education [employer].

## 2015-07-12 NOTE — ED Notes (Signed)
Brought over from [facility] with lower ext. Swelling and low o2 sat and some uti sxs'

## 2015-07-12 NOTE — ED Notes (Addendum)
Family states pt has had urinary frequency for the last 2 days and distended abd., states pt is up most of the night urinating,  states he was discharged from ICU on Monday and had foley cath while he was in the hospital

## 2015-07-12 NOTE — ED Notes (Signed)
Pt given sandwhich tray at this time per verbal okay from [facility], sitting up in bed eating and tolerating well. No acute distress noted.

## 2015-07-12 NOTE — ED Provider Notes (Signed)
[facility] Emergency Department Provider Note  ____________________________________________  Time seen: 1355  I have reviewed the triage vital signs and the nursing notes.   HISTORY  Chief Complaint Urinary Frequency     HPI Aaron Hendrix is a 81 y.o. male who was recently in the hospital for pneumonia and lung mass. The lung mass is under ongoing evaluation. He has been having increased urinary frequency lately. This is interfering with his sleep cycle. The family has called the patient's primary physician to request Flomax, but the physician preferred for the patient be evaluated for infection prior to initiating such a prescription. Today, the family brought the patient to [facility] clinic. The clinic was concerned about the swelling in his legs, some mild hypoxia, and refer the patient to the emergency department for his evaluation.  Here in the emergency department, the patient is a little bit somnolent but awakes appropriately and answers questions well. Much of the history is from his family. They are concerned about the increased frequency of urination and some abdominal distention. The patient denies any abdominal discomfort. Patient does have swelling in both legs, however the family reports that the swelling of her bit better now than it has been. Other than increased frequency of urination, there is no dysuria, there is no fever.     Past Medical History  Diagnosis Date  . COPD with asthma ([place])   . Diabetes mellitus     Diet control   . Hyperlipidemia   . Carpal tunnel syndrome   . DJD (degenerative joint disease), cervical   . Vitamin D deficiency   . Tremor, essential   . Internal hemorrhoids   . Personal history of colonic polyps     adenomatous  . IBS (irritable bowel syndrome)   . Peptic ulcer   . Duodenal ulcer, with partial obstruction 08/10/2012  . Numbness and tingling in right hand     started 2 yeas ago  . Hypertension      no medicine needed  . Memory deficit 12/16/2013  . Essential and other specified forms of tremor 12/16/2013  . Polyneuropathy in diabetes(357.2)     Patient Active Problem List   Diagnosis Date Noted  . Malnutrition of moderate degree ([place]) 07/02/2015  . Community acquired pneumonia   . Hyponatremia 06/25/2015  . Skin lesion 02/16/2015  . Health care maintenance 02/16/2015  . Hand laceration 09/27/2014  . Hypothyroidism 09/27/2014  . Trigger thumb 06/22/2014  . Abdominal pain 06/22/2014  . Memory deficit 12/16/2013  . Essential and other specified forms of tremor 12/16/2013  . Diarrhea 10/24/2013  . Loss of weight 09/10/2013  . Periumbilical abdominal pain 09/10/2013  . Pain in lower limb 07/01/2013  . Dermatophytosis of foot 07/01/2013  . Degenerative arthritis of hip 11/13/2012  . Nausea 08/10/2012  . Hypertension 05/25/2011  . Diabetes mellitus, type II ([place]) 05/25/2011  . Irritable bowel syndrome 03/31/2008  . History of colonic polyps 03/31/2008    Past Surgical History  Procedure Laterality Date  . Anal fissure repair    . Colonoscopy w/ biopsies and polypectomy  8/03, 6/05, 7/09, 9/10    internal hemorrhoids, tubular adenomas, mucosa & lymphoid nodules  . Upper gastrointestinal endoscopy  3/05, 7/09, 9/10,2013    gastritis, duodenitis  . Total hip arthroplasty Right 11/13/2012    Procedure: TOTAL HIP ARTHROPLASTY ANTERIOR APPROACH;  Surgeon: Mcarthur Rossetti, MD;  Location: [facility];  Service: Orthopedics;  Laterality: Right;  Right total hip arthroplasty  .  Ankle surgery Right     right- pins placed in    Current Outpatient Rx  Name  Route  Sig  Dispense  Refill  . acetaminophen (TYLENOL) 500 MG tablet   Oral   Take 500 mg by mouth every 6 (six) hours as needed.         Marland Kitchen acidophilus (RISAQUAD) CAPS capsule   Oral   Take 2 capsules by mouth daily.   60 capsule   0   . albuterol (PROVENTIL) (2.5 MG/3ML) 0.083% nebulizer solution   Nebulization    Take 3 mLs (2.5 mg total) by nebulization every 6 (six) hours.   75 mL   12   . amLODipine (NORVASC) 5 MG tablet   Oral   Take 1 tablet (5 mg total) by mouth daily.   30 tablet   0   . cloNIDine (CATAPRES) 0.1 MG tablet   Oral   Take 1 tablet (0.1 mg total) by mouth 2 (two) times daily.   60 tablet   11   . Docusate Calcium (STOOL SOFTENER PO)   Oral   Take by mouth.         . feeding supplement, ENSURE ENLIVE, (ENSURE ENLIVE) LIQD   Oral   Take 237 mLs by mouth 2 (two) times daily between meals.   237 mL   12   . ipratropium (ATROVENT) 0.06 % nasal spray   Nasal   Place 2 sprays into the nose daily as needed.          Marland Kitchen levothyroxine (SYNTHROID, LEVOTHROID) 50 MCG tablet   Oral   Take 50 mcg by mouth daily before breakfast.         . loratadine (CLARITIN) 10 MG tablet   Oral   Take 10 mg by mouth daily.         . metoprolol tartrate (LOPRESSOR) 25 MG tablet   Oral   Take 1 tablet (25 mg total) by mouth 2 (two) times daily.   60 tablet   0   . mometasone-formoterol (DULERA) 100-5 MCG/ACT AERO   Inhalation   Inhale 2 puffs into the lungs 2 (two) times daily.   1 Inhaler   0   . pantoprazole (PROTONIX) 40 MG tablet      TAKE 1 TABLET BY MOUTH ONCE DAILY, 30-60MINUTES BEFORE A MEAL.   30 tablet   5   . phenazopyridine (PYRIDIUM) 100 MG tablet   Oral   Take 1 tablet (100 mg total) by mouth 2 (two) times daily before a meal.   2 tablet   0   . saccharomyces boulardii (FLORASTOR) 250 MG capsule   Oral   Take 250 mg by mouth daily.         Marland Kitchen tiotropium (SPIRIVA) 18 MCG inhalation capsule   Inhalation   Place 1 capsule (18 mcg total) into inhaler and inhale daily.   30 capsule   12   . traMADol (ULTRAM) 50 MG tablet   Oral   Take by mouth at bedtime as needed.         . Vitamin D, Ergocalciferol, (DRISDOL) 50000 UNITS CAPS capsule   Oral   Take 50,000 Units by mouth every 7 (seven) days. On Friday            Allergies Fexofenadine; Quinapril hcl; and Latex  Family History  Problem Relation Age of Onset  . Colon cancer Neg Hx   . Esophageal cancer Neg Hx   . Rectal cancer Neg  Hx   . Stomach cancer Neg Hx   . Leukemia Maternal Aunt   . Thyroid cancer Daughter 61  . Diabetes Son     Social History Social History  Substance Use Topics  . Smoking status: Former Smoker    Types: Cigarettes    Quit date: 07/02/1963  . Smokeless tobacco: Never Used  . Alcohol Use: 0.0 oz/week    0 Standard drinks or equivalent per week     Comment: occasional    Review of Systems  Constitutional: Negative for fever. ENT: Negative for sore throat. Cardiovascular: Negative for chest pain. Respiratory: Recent diagnosis of pneumonia and lung mass.. Gastrointestinal: Lower abdominal distention.. Genitourinary: Increased urinary frequency.. Musculoskeletal: Notable edema bilaterally. Him told this is improved.. Skin: Negative for rash. Neurological: Negative for paresthesia or weakness   10-point ROS otherwise negative.  ____________________________________________   PHYSICAL EXAM:  VITAL SIGNS: ED Triage Vitals  Enc Vitals Group     BP 07/12/15 1349 161/69 mmHg     Pulse Rate 07/12/15 1349 69     Resp 07/12/15 1349 18     Temp 07/12/15 1349 98.1 F (36.7 C)     Temp Source 07/12/15 1349 Oral     SpO2 07/12/15 1349 97 %     Weight 07/12/15 1349 134 lb (60.782 kg)     Height 07/12/15 1349 '5\' 4"'$  (1.626 m)     Head Cir --      Peak Flow --      Pain Score 07/12/15 1350 0     Pain Loc --      Pain Edu? --      Excl. in [place]? --     Constitutional: Little bit somnolent, but awakens appropriately. No distress. ENT   Head: Normocephalic and atraumatic.   Nose: No congestion/rhinnorhea.    Cardiovascular: Normal rate, regular rhythm, no murmur noted Respiratory:  Normal respiratory effort, no tachypnea.    Breath sounds are clear and equal bilaterally.  Gastrointestinal: Soft  and nontender. No distention.  Back: No muscle spasm, no tenderness, no CVA tenderness. Musculoskeletal: No deformity noted. Nontender with normal range of motion in all extremities. Pitting edema bilaterally in the lower extremities.  Neurologic:  Low bit somnolent, but awakens appropriately and answers questions. Moves all 4 extremities spontaneously. No focal neurologic deficit noted.  Skin:  Skin is warm, dry. No rash noted.   ____________________________________________    LABS (pertinent positives/negatives)  Labs Reviewed  URINALYSIS COMPLETEWITH MICROSCOPIC ([facility] ONLY) - Abnormal; Notable for the following:    Color, Urine STRAW (*)    APPearance CLEAR (*)    Protein, ur 30 (*)    All other components within normal limits  CBC WITH DIFFERENTIAL/PLATELET - Abnormal; Notable for the following:    RBC 4.22 (*)    Hemoglobin 12.8 (*)    MCHC 31.8 (*)    All other components within normal limits  BASIC METABOLIC PANEL - Abnormal; Notable for the following:    Sodium 134 (*)    Chloride 93 (*)    CO2 34 (*)    Glucose, Bld 152 (*)    All other components within normal limits     ____________________________________________ ____________________________________________   INITIAL IMPRESSION / ASSESSMENT AND PLAN / ED COURSE  Pertinent labs & imaging results that were available during my care of the patient were reviewed by me and considered in my medical decision making (see chart for details).  Pleasant 81 year old male in his usual state of ill  health. The family reports his swelling is a bit better. They're concerned about his frequent urination and some noted abdominal distention. Will obtain a bladder scan to assess for obstruction and check a urinalysis for possible infection.  ----------------------------------------- 3:09 PM on 07/12/2015 -----------------------------------------  Bladder scan shows a bladder with less than 250 ML's present. The urinalysis looks  good with no sign of infection.  Blood labs are pending to be sure that his sodium and potassium levels have not dropped.  ----------------------------------------- 4:43 PM on 07/12/2015 -----------------------------------------  Blood tests look good. Sodium and potassium levels are reasonable. Urinalysis looks good with no sign of infection. They'll function looks good.  Reexamination this time finds the patient stable. He is little more alert than when I first saw him. He seems comfortable. I've reviewed the findings with his family. I do not believe any antibiotic is indicated. I am reluctant to start him on Flomax due to possible hypotension. We will give him Pyridium for the frequent urination and possible spasm. The family has asked for a sedative to help him sleep at night. He reported his somnolence now is due to the fact that he doesn't sleep at night. This patient has a, it situation medically, but I will prescribe Ambien, 5 mg, 6, to be used when necessary. Beyond that, and light for his primary physician to prescribe any medications she feels is appropriate.  ____________________________________________   FINAL CLINICAL IMPRESSION(S) / ED DIAGNOSES  Final diagnoses:  Frequent urination   peripheral edema History of lung mass    Ahmed Prima, MD 07/12/15 1650

## 2015-07-13 ENCOUNTER — Ambulatory Visit (INDEPENDENT_AMBULATORY_CARE_PROVIDER_SITE_OTHER): Payer: Medicare Other | Admitting: Pulmonary Disease

## 2015-07-13 ENCOUNTER — Ambulatory Visit
Admission: RE | Admit: 2015-07-13 | Discharge: 2015-07-13 | Disposition: A | Discharge status: Home / Self Care | Payer: Medicare Other | Source: Ambulatory Visit | Attending: Pulmonary Disease | Admitting: Pulmonary Disease

## 2015-07-13 ENCOUNTER — Encounter: Payer: Self-pay | Admitting: Pulmonary Disease

## 2015-07-13 ENCOUNTER — Telehealth: Payer: Self-pay | Admitting: Pulmonary Disease

## 2015-07-13 VITALS — BP 122/62 | HR 71 | Ht 64.0 in | Wt 136.0 lb

## 2015-07-13 DIAGNOSIS — I10 Essential (primary) hypertension: Secondary | ICD-10-CM

## 2015-07-13 DIAGNOSIS — R600 Localized edema: Secondary | ICD-10-CM

## 2015-07-13 DIAGNOSIS — E222 Syndrome of inappropriate secretion of antidiuretic hormone: Secondary | ICD-10-CM | POA: Diagnosis not present

## 2015-07-13 DIAGNOSIS — J449 Chronic obstructive pulmonary disease, unspecified: Secondary | ICD-10-CM

## 2015-07-13 DIAGNOSIS — R0689 Other abnormalities of breathing: Secondary | ICD-10-CM

## 2015-07-13 DIAGNOSIS — R911 Solitary pulmonary nodule: Secondary | ICD-10-CM | POA: Diagnosis not present

## 2015-07-13 DIAGNOSIS — J439 Emphysema, unspecified: Secondary | ICD-10-CM | POA: Diagnosis not present

## 2015-07-13 DIAGNOSIS — R350 Frequency of micturition: Secondary | ICD-10-CM | POA: Diagnosis not present

## 2015-07-13 LAB — BASIC METABOLIC PANEL
Anion gap: 7 (ref 5–15)
BUN: 11 mg/dL (ref 6–20)
CALCIUM: 9.1 mg/dL (ref 8.9–10.3)
CO2: 33 mmol/L — ABNORMAL HIGH (ref 22–32)
CREATININE: 0.83 mg/dL (ref 0.61–1.24)
Chloride: 91 mmol/L — ABNORMAL LOW (ref 101–111)
GFR calc Af Amer: >60 mL/min (ref >60)
Glucose, Bld: 131 mg/dL — ABNORMAL HIGH (ref 65–99)
Potassium: 3.9 mmol/L (ref 3.5–5.1)
SODIUM: 131 mmol/L — AB (ref 135–145)

## 2015-07-13 LAB — TSH: TSH: 1.733 u[IU]/mL (ref 0.350–4.500)

## 2015-07-13 MED ORDER — FLUTICASONE FUROATE-VILANTEROL 100-25 MCG/INH IN AEPB: 1.0000 | INHALATION_SPRAY | Freq: Every day | RESPIRATORY_TRACT | Status: DC

## 2015-07-13 MED ORDER — ALBUTEROL SULFATE (2.5 MG/3ML) 0.083% IN NEBU: 2.5000 mg | INHALATION_SOLUTION | Freq: Four times a day (QID) | RESPIRATORY_TRACT | Status: DC | PRN

## 2015-07-13 MED ORDER — LOSARTAN POTASSIUM-HCTZ 50-12.5 MG PO TABS: 1.0000 | ORAL_TABLET | Freq: Every day | ORAL | Status: DC

## 2015-07-13 NOTE — Progress Notes (Signed)
BACKGROUND: Hospitalized 9/29 - 10/10 initially with AMS and hyponatremia. Developed progressive hypersomnolence and was found to be profoundly hypercarbic (PaCO2 162 torr!!). Underwent intubation and mechanical ventilation 9/30 - 07/01/15. PCCM was involved in his care during the time of his time in the ICU. It was felt that the hypercarbic resp failure was due to COPD and he was discharged home on bronchodilator therapy as documented. Pulmonary follow up was arranged. His hospitalization was c/b hypertension and he was eventually discharged on 3 anti-hypertensive medications. On his initial CXR, there was concern for a RUL nodule which was confirmed by CT chest.   INTERVAL HISTORY: Per his daughter (a Software engineer) his dyspnea is improved. She is most concerned with LE edema and wants clarification on his medical regimen. He has no new complaints other than LE edema. He was discharged home on [place] with albuterol nebs. He was also discharged home on nocturnal BiPAP with a polysomnogram ordered to R/O OSA. He has seen Dr Jeb Levering for the RUL nodule and reportedly, there was discussion of performing a PET scan   OBJ: Filed Vitals:   07/13/15 0921 07/13/15 0922  BP:  122/62  Pulse:  71  Height: '5\' 4"'$  (1.626 m)   Weight: 61.689 kg (136 lb)   SpO2:  99%   Frail appearing, no respiratory distress HEENT WNL No JVD noted BS markedly diminished without adventitious sounds, no wheezing Reg, no M noted Abd soft, NT, +BS Ext with 3+ RLE pitting edema, 2+ pitting LLE edema Neuro: diffusely weak, no focal deficits  Outpatient Encounter Prescriptions as of 07/13/2015  Medication Sig  . acetaminophen (TYLENOL) 500 MG tablet Take 500 mg by mouth every 6 (six) hours as needed.  Marland Kitchen amLODipine (NORVASC) 5 MG tablet Take 1 tablet (5 mg total) by mouth daily.  . cloNIDine (CATAPRES) 0.1 MG tablet Take 1 tablet (0.1 mg total) by mouth 2 (two) times daily.  Mariane Baumgarten Calcium (STOOL SOFTENER PO) Take  by mouth.  . feeding supplement, ENSURE ENLIVE, (ENSURE ENLIVE) LIQD Take 237 mLs by mouth 2 (two) times daily between meals.  Marland Kitchen levothyroxine (SYNTHROID, LEVOTHROID) 50 MCG tablet Take 50 mcg by mouth daily before breakfast.  . loratadine (CLARITIN) 10 MG tablet Take 10 mg by mouth daily.  . metoprolol tartrate (LOPRESSOR) 25 MG tablet Take 1 tablet (25 mg total) by mouth 2 (two) times daily.  . mometasone-formoterol (DULERA) 100-5 MCG/ACT AERO Inhale 2 puffs into the lungs 2 (two) times daily.  . pantoprazole (PROTONIX) 40 MG tablet TAKE 1 TABLET BY MOUTH ONCE DAILY, 30-60MINUTES BEFORE A MEAL.  Marland Kitchen phenazopyridine (PYRIDIUM) 100 MG tablet Take 1 tablet (100 mg total) by mouth 2 (two) times daily before a meal.  . tiotropium (SPIRIVA) 18 MCG inhalation capsule Place 1 capsule (18 mcg total) into inhaler and inhale daily.  . traMADol (ULTRAM) 50 MG tablet Take by mouth at bedtime as needed.  . zolpidem (AMBIEN) 5 MG tablet Take 5 mg by mouth at bedtime as needed for sleep.  . [DISCONTINUED] albuterol (PROVENTIL) (2.5 MG/3ML) 0.083% nebulizer solution Take 3 mLs (2.5 mg total) by nebulization every 6 (six) hours.  . [DISCONTINUED] Vitamin D, Ergocalciferol, (DRISDOL) 50000 UNITS CAPS capsule Take 50,000 Units by mouth every 7 (seven) days. On Friday  . acidophilus (RISAQUAD) CAPS capsule Take 2 capsules by mouth daily. (Patient not taking: Reported on 07/13/2015)  . albuterol (PROVENTIL) (2.5 MG/3ML) 0.083% nebulizer solution Take 3 mLs (2.5 mg total) by nebulization every 6 (six) hours  as needed for wheezing or shortness of breath.  . Fluticasone Furoate-Vilanterol (BREO ELLIPTA) 100-25 MCG/INH AEPB Inhale 1 puff into the lungs daily.  . Fluticasone Furoate-Vilanterol 100-25 MCG/INH AEPB Inhale 1 puff into the lungs daily.  Marland Kitchen ipratropium (ATROVENT) 0.06 % nasal spray Place 2 sprays into the nose daily as needed.   Marland Kitchen losartan-hydrochlorothiazide (HYZAAR) 50-12.5 MG tablet Take 1 tablet by mouth  daily.  Marland Kitchen saccharomyces boulardii (FLORASTOR) 250 MG capsule Take 250 mg by mouth daily.  . [DISCONTINUED] zolpidem (AMBIEN CR) 12.5 MG CR tablet Take 1 tablet (12.5 mg total) by mouth at bedtime as needed for sleep. (Patient not taking: Reported on 07/13/2015)   No facility-administered encounter medications on file as of 07/13/2015.   DATA: BMP Latest Ref Rng 07/12/2015 07/06/2015 07/05/2015  Glucose 65 - 99 mg/dL 152(H) - 102(H)  BUN 6 - 20 mg/dL 10 - 25(H)  Creatinine 0.61 - 1.24 mg/dL 0.81 - 0.90  Sodium 135 - 145 mmol/L 134(L) - 140  Potassium 3.5 - 5.1 mmol/L 3.9 3.6 3.0(L)  Chloride 101 - 111 mmol/L 93(L) - 95(L)  CO2 22 - 32 mmol/L 34(H) - 39(H)  Calcium 8.9 - 10.3 mg/dL 9.1 - 8.5(L)    CBC Latest Ref Rng 07/12/2015 07/05/2015 07/02/2015  WBC 3.8 - 10.6 K/uL 7.0 8.7 15.5(H)  Hemoglobin 13.0 - 18.0 g/dL 12.8(L) 11.4(L) 12.9(L)  Hematocrit 40.0 - 52.0 % 40.3 35.8(L) 40.9  Platelets 150 - 440 K/uL 280 272 310   CXR (10/06): NACPD  CT chest (9/29): Lobulated RUL "nodule" with what appears to be an air bronchogram   IMPRESSION/PLAN: 1) Chronic hypercarbic respiratory failure. Suspected COPD with recent acute exacerbation. He has notably minimal smoking history to explain such severe manifestations. There is no emphysema on CT chest but exam is consistent with impaired airflow.   - Stop Dulera. Begin Breo 100/25. One inhalation daily  - Continue Spiriva  - Change albuterol nebulizer to PRN - refill ordered  - PSG 07/27/15  - Spirometry on follow up visit in 2 weeks  2) RUL nodule - appearance is more consistent with an inflammatory process than malignancy. If it is indeed malignant, he does not appear to be a candidate for resection  - Repeat CT scan chest  3) New diagnosis of hypertension. Recent start of metoprolol, amlodipine and clonidine. At his age, clonidine might be a less ideal choice of agents  - Stop amlodipine  - Decrease clonidine to once a day at night time for  one week, then stop (Last dose 10/23)  - Hyzaar 50/12.5 daily  - Continue metoprolol as previously prescribed  4) New finding of severe asymmetric LE edema - possibly due to amlodipine  - LE venous Doppler today   5) Recent hyponatremia  - Recheck BMET  - Check TSH  Follow up in 2 weeks  [name], MD [facility]/CCM

## 2015-07-13 NOTE — Telephone Encounter (Signed)
Called and spoke to pt and his wife. Informed him of results. Pt and his wife had no further questions, nothing further needed.

## 2015-07-13 NOTE — Telephone Encounter (Signed)
Received call report from [place] at [facility] Ultrasound Pt is neg for DVT in both legs Results in Epic

## 2015-07-13 NOTE — Telephone Encounter (Signed)
Results of LE venous scans noted. Let pt know that there is no evidence of blood clots  Aaron Hendrix

## 2015-07-13 NOTE — Patient Instructions (Addendum)
Stop Dulera and Symbicort Breo - one inhalation daily Continue Spiriva Change albuterol nebulizer to as needed - refill ordered Stop amlodipine Decrease clonidine to once a day at night time for one week, then stop (Last dose 10/23) Hyzaar 50/12.5 daily Repeat CT scan chest to evaluate suspected lung nodule Spirometry today LE venous Doppler today TSH, BMET today Sleep study as ordered for 10/31

## 2015-07-15 ENCOUNTER — Encounter: Payer: Self-pay | Admitting: Internal Medicine

## 2015-07-15 ENCOUNTER — Ambulatory Visit (INDEPENDENT_AMBULATORY_CARE_PROVIDER_SITE_OTHER): Payer: Medicare Other | Admitting: Internal Medicine

## 2015-07-15 ENCOUNTER — Other Ambulatory Visit: Payer: Self-pay | Admitting: *Deleted

## 2015-07-15 VITALS — BP 130/60 | HR 68 | Resp 14 | Ht 64.0 in | Wt 135.0 lb

## 2015-07-15 DIAGNOSIS — E039 Hypothyroidism, unspecified: Secondary | ICD-10-CM

## 2015-07-15 DIAGNOSIS — J189 Pneumonia, unspecified organism: Secondary | ICD-10-CM | POA: Diagnosis not present

## 2015-07-15 DIAGNOSIS — E119 Type 2 diabetes mellitus without complications: Secondary | ICD-10-CM

## 2015-07-15 DIAGNOSIS — J441 Chronic obstructive pulmonary disease with (acute) exacerbation: Secondary | ICD-10-CM

## 2015-07-15 DIAGNOSIS — E871 Hypo-osmolality and hyponatremia: Secondary | ICD-10-CM

## 2015-07-15 DIAGNOSIS — I10 Essential (primary) hypertension: Secondary | ICD-10-CM

## 2015-07-15 DIAGNOSIS — R350 Frequency of micturition: Secondary | ICD-10-CM

## 2015-07-15 DIAGNOSIS — C3490 Malignant neoplasm of unspecified part of unspecified bronchus or lung: Secondary | ICD-10-CM

## 2015-07-15 DIAGNOSIS — R600 Localized edema: Secondary | ICD-10-CM

## 2015-07-15 LAB — BASIC METABOLIC PANEL
BUN: 10 mg/dL (ref 6–23)
CALCIUM: 9.3 mg/dL (ref 8.4–10.5)
CO2: 34 mEq/L — ABNORMAL HIGH (ref 19–32)
Chloride: 83 mEq/L — ABNORMAL LOW (ref 96–112)
Creatinine, Ser: 0.87 mg/dL (ref 0.40–1.50)
GFR: 107.81 mL/min (ref >60.00)
GLUCOSE: 197 mg/dL — AB (ref 70–99)
POTASSIUM: 4.5 meq/L (ref 3.5–5.1)
SODIUM: 124 meq/L — AB (ref 135–145)

## 2015-07-15 NOTE — Progress Notes (Signed)
Pre visit review using our clinic review tool, if applicable. No additional management support is needed unless otherwise documented below in the visit note. 

## 2015-07-15 NOTE — Progress Notes (Signed)
[name], male   DOB: 01-Oct-1929, 81 y.o.   MRN: [id number]   Subjective:    [name], male    DOB: Feb 22, 1930, 81 y.o.   MRN: [id number]  HPI  Patient here for a hospital follow up.  Was recently admitted with hyponatremia, altered mental status (encephalopathy - metabolic), acute on chronic hypoxic respiratory failure due to COPD flare and pneumonia and sepsis.  Underwent intubation and mechanical ventilation.  Also found to have pulmonary nodule.  Since discharge dyspnea has improved. Has been having increased lower extremity swelling.  Saw Dr Alva Garnet two days ago.  See his note for details.  Lower extremity ultrasound - negative for DVT.  Dr Alva Garnet started him on Breo and he was advised to continue spiriva. Planning for f/u chest CT for f/u of the lung nodule.  He was also found to be hypertensive.  Dr Alva Garnet adjusted medications.  He was started on hyzaar.  Amlodipine was stopped and clonidine is being tapered.  Also on metoprolol. He is eating some.  No diarrhea.  Some decreased bowel movements.  Still having bowels movements, but only small amounts.  Daughter plans to get miralax.  He is urinating a lt.  Is up every thirty minutes - urinating.  Affecting his sleep.  They had questions about sleeping medications and also medication to help with urination.  He went to ER several days ago with concerns regarding increased urination.  See ER note for details.  No urinary retention found.  Urine negative for infection.  Still weak.  Planning for home healt physical therapy and nursing.     Past Medical History  Diagnosis Date  . COPD with asthma ([place])   . Diabetes mellitus     Diet control   . Hyperlipidemia   . Carpal tunnel syndrome   . DJD (degenerative joint disease), cervical   . Vitamin D deficiency   . Tremor, essential   . Internal hemorrhoids   . Personal history of colonic polyps     adenomatous  . IBS (irritable bowel syndrome)   .  Peptic ulcer   . Duodenal ulcer, with partial obstruction 08/10/2012  . Numbness and tingling in right hand     started 2 yeas ago  . Hypertension     no medicine needed  . Memory deficit 12/16/2013  . Essential and other specified forms of tremor 12/16/2013  . Polyneuropathy in diabetes(357.2)    Past Surgical History  Procedure Laterality Date  . Anal fissure repair    . Colonoscopy w/ biopsies and polypectomy  8/03, 6/05, 7/09, 9/10    internal hemorrhoids, tubular adenomas, mucosa & lymphoid nodules  . Upper gastrointestinal endoscopy  3/05, 7/09, 9/10,2013    gastritis, duodenitis  . Total hip arthroplasty Right 11/13/2012    Procedure: TOTAL HIP ARTHROPLASTY ANTERIOR APPROACH;  Surgeon: Mcarthur Rossetti, MD;  Location: [facility];  Service: Orthopedics;  Laterality: Right;  Right total hip arthroplasty  . Ankle surgery Right     right- pins placed in   Family History  Problem Relation Age of Onset  . Colon cancer Neg Hx   . Esophageal cancer Neg Hx   . Rectal cancer Neg Hx   . Stomach cancer Neg Hx   . Leukemia Maternal Aunt   . Thyroid cancer Daughter 17  . Diabetes Son    Social History   Social History  . Marital Status: Married    Spouse Name: N/A  .  Number of Children: 3  . 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 Smoking status: Former Smoker    Types: Cigarettes    Quit date: 07/02/1963  . Smokeless tobacco: Never Used  . Alcohol Use: 0.0 oz/week    0 Standard drinks or equivalent per week     Comment: occasional  . Drug Use: No  . Sexual Activity: Not Asked   Other Topics Concern  . None   Social History Narrative   Veteran [employer]    Outpatient Encounter Prescriptions as of 07/15/2015  Medication Sig  . acetaminophen (TYLENOL) 500 MG tablet Take 500 mg by mouth every 6 (six) hours as needed.  Marland Kitchen acidophilus (RISAQUAD) CAPS capsule Take 2 capsules by mouth daily.  Marland Kitchen  albuterol (PROVENTIL) (2.5 MG/3ML) 0.083% nebulizer solution Take 3 mLs (2.5 mg total) by nebulization every 6 (six) hours as needed for wheezing or shortness of breath.  . cloNIDine (CATAPRES) 0.1 MG tablet Take 1 tablet (0.1 mg total) by mouth 2 (two) times daily.  Mariane Baumgarten Calcium (STOOL SOFTENER PO) Take by mouth.  . feeding supplement, ENSURE ENLIVE, (ENSURE ENLIVE) LIQD Take 237 mLs by mouth 2 (two) times daily between meals.  . Fluticasone Furoate-Vilanterol 100-25 MCG/INH AEPB Inhale 1 puff into the lungs daily.  Marland Kitchen levothyroxine (SYNTHROID, LEVOTHROID) 50 MCG tablet Take 50 mcg by mouth daily before breakfast.  . loratadine (CLARITIN) 10 MG tablet Take 10 mg by mouth daily.  Marland Kitchen losartan-hydrochlorothiazide (HYZAAR) 50-12.5 MG tablet Take 1 tablet by mouth daily.  . metoprolol tartrate (LOPRESSOR) 25 MG tablet Take 1 tablet (25 mg total) by mouth 2 (two) times daily.  . mometasone-formoterol (DULERA) 100-5 MCG/ACT AERO Inhale 2 puffs into the lungs 2 (two) times daily.  . phenazopyridine (PYRIDIUM) 100 MG tablet Take 1 tablet (100 mg total) by mouth 2 (two) times daily before a meal.  . tiotropium (SPIRIVA) 18 MCG inhalation capsule Place 1 capsule (18 mcg total) into inhaler and inhale daily.  . traMADol (ULTRAM) 50 MG tablet Take by mouth at bedtime as needed.  . zolpidem (AMBIEN) 5 MG tablet Take 5 mg by mouth at bedtime as needed for sleep.  . [DISCONTINUED] pantoprazole (PROTONIX) 40 MG tablet TAKE 1 TABLET BY MOUTH ONCE DAILY, 30-60MINUTES BEFORE A MEAL.  Marland Kitchen amLODipine (NORVASC) 5 MG tablet Take 1 tablet (5 mg total) by mouth daily. (Patient not taking: Reported on 07/15/2015)  . ipratropium (ATROVENT) 0.06 % nasal spray Place 2 sprays into the nose daily as needed.   . saccharomyces boulardii (FLORASTOR) 250 MG capsule Take 250 mg by mouth daily.  . [DISCONTINUED] Fluticasone Furoate-Vilanterol (BREO ELLIPTA) 100-25 MCG/INH AEPB Inhale 1 puff into the lungs daily. (Patient not taking:  Reported on 07/15/2015)   No facility-administered encounter medications on file as of 07/15/2015.    Review of Systems  Constitutional: Positive for fatigue.       Some decreased appetite.    HENT: Negative for congestion and sinus pressure.   Respiratory: Negative for cough, chest tightness and shortness of breath.   Cardiovascular: Positive for leg swelling. Negative for chest pain and palpitations.  Gastrointestinal: Negative for nausea, vomiting, abdominal pain and diarrhea.  Genitourinary: Negative for dysuria and difficulty urinating.       Increased urinary frequency.    Musculoskeletal: Negative for back pain and joint swelling.  Skin: Negative for color change and rash.  Neurological: Negative for dizziness and headaches.  Psychiatric/Behavioral: Negative for dysphoric mood and agitation.       Objective:     Blood pressure rechecked by me:  140/68  Physical Exam  Constitutional: No distress.  HENT:  Nose: Nose normal.  Minimal dryness - mucus membranes.   Eyes: Conjunctivae are normal. Right eye exhibits no discharge. Left eye exhibits no discharge.  Neck: Neck supple.  Cardiovascular: Normal rate and regular rhythm.   Pulmonary/Chest: Effort normal and breath sounds normal. No respiratory distress.  Abdominal: Soft. Bowel sounds are normal. There is no tenderness.  Musculoskeletal: He exhibits no tenderness.  Persistent bilateral lower extremity edema (mostly form mid lower leg to feet).  (reported improved).   Lymphadenopathy:    He has no cervical adenopathy.  Skin: No rash noted. No erythema.  Psychiatric: His behavior is normal.  Able to answer question appropriately.     BP 130/60 mmHg  Pulse 68  Resp 14  Ht '5\' 4"'$  (1.626 m)  Wt 135 lb (61.236 kg)  BMI 23.16 kg/m2  SpO2 95% Wt Readings from Last 3 Encounters:  07/15/15 135 lb (61.236 kg)  07/13/15 136 lb (61.689 kg)  07/12/15 134 lb (60.782 kg)     Lab Results  Component Value Date   WBC 7.0  07/12/2015   HGB 12.8* 07/12/2015   HCT 40.3 07/12/2015   PLT 280 07/12/2015   GLUCOSE 197* 07/15/2015   CHOL 223* 06/24/2015   TRIG 53.0 06/24/2015   HDL 96.10 06/24/2015   LDLCALC 116* 06/24/2015   ALT 12 06/24/2015   AST 23 06/24/2015   NA 124* 07/15/2015   K 4.5 07/15/2015   CL 83* 07/15/2015   CREATININE 0.87 07/15/2015   BUN 10 07/15/2015   CO2 34* 07/15/2015   TSH 1.733 07/13/2015   PSA 0.83 02/06/2015   INR 0.94 11/06/2012   HGBA1C 6.1* 06/26/2015   MICROALBUR 93.8* 06/24/2015    US Venous Img Lower Bilateral  07/13/2015  CLINICAL DATA:  Bilateral lower extremity edema. History of smoking. Evaluate for DVT. EXAM: BILATERAL LOWER EXTREMITY VENOUS DOPPLER ULTRASOUND TECHNIQUE: Gray-scale sonography with graded compression, as well as color Doppler and duplex ultrasound were performed to evaluate the lower extremity deep venous systems from the level of the common femoral vein and including the common femoral, femoral, profunda femoral, popliteal and calf veins including the posterior tibial, peroneal and gastrocnemius veins when visible. The superficial great saphenous vein was also interrogated. Spectral Doppler was utilized to evaluate flow at rest and with distal augmentation maneuvers in the common femoral, femoral and popliteal veins. COMPARISON:  None. FINDINGS: RIGHT LOWER EXTREMITY Common Femoral Vein: No evidence of thrombus. Normal compressibility, respiratory phasicity and response to augmentation. Saphenofemoral Junction: No evidence of thrombus. Normal compressibility and flow on color Doppler imaging. Profunda Femoral Vein: No evidence of thrombus. Normal compressibility and flow on color Doppler imaging. Femoral Vein: No evidence of thrombus. Normal compressibility, respiratory phasicity and response to augmentation. Popliteal Vein: No evidence of thrombus. Normal compressibility, respiratory phasicity and response to augmentation. Calf Veins: No evidence of thrombus.  Normal compressibility and flow on color Doppler imaging. Superficial Great Saphenous Vein: No evidence of thrombus. Normal compressibility and flow on color Doppler imaging. Venous Reflux:  None. Other Findings:  None. LEFT LOWER EXTREMITY Common Femoral Vein: No evidence of thrombus. Normal compressibility, respiratory phasicity and response to augmentation. Saphenofemoral Junction: No evidence of thrombus. Normal compressibility and flow on color Doppler imaging. Profunda Femoral Vein: No evidence  of thrombus. Normal compressibility and flow on color Doppler imaging. Femoral Vein: No evidence of thrombus. Normal compressibility, respiratory phasicity and response to augmentation. Popliteal Vein: No evidence of thrombus. Normal compressibility, respiratory phasicity and response to augmentation. Calf Veins: No evidence of thrombus. Normal compressibility and flow on color Doppler imaging. Superficial Great Saphenous Vein: No evidence of thrombus. Normal compressibility and flow on color Doppler imaging. Venous Reflux:  None. Other Findings:  None. IMPRESSION: No evidence of DVT within either lower extremity. Electronically Signed   By: Sandi Mariscal M.D.   On: 07/13/2015 12:41       Assessment & Plan:   Problem List Items Addressed This Visit    Community acquired pneumonia    Treated in hospital.  Seeing pulmonary.        COPD (chronic obstructive pulmonary disease) ([place])    Recently admitted with acute exacerbation.  Saw pulmonary.  On breo and spiriva.  Breathing better.  Follow.        Diabetes mellitus, type II ([place])    Sugars had been doing well on no medication.  Follow.  Continue to encourage increased po intake.        Hypertension    Blood pressure as outlined.  Off amlodipine now.  Tapering clonidine.  Continue metoprolol.   Was just recently started on hyzaar.  Has had issues with low sodium as outlined.  Last sodium 131 - decrease from previous.  Will recheck metabolic panel today.   Will need to watch electrolytes closely.  Now on a diuretic - follow sodium and potassium.  May need to adjust medications more.        Hyponatremia - Primary    Recent sodium low.  Hospitalized.  See note.  Improved sodium with treatment.  Sodium appears to be trending down.  Last check 131.  On hyzaar now.  Some decreased po food intake with increased urinary frequency and nocturia.  Recheck metabolic panel today.        Relevant Orders   Basic metabolic panel (Completed)   Hypothyroidism    On thyroid replacement.  Recent tsh wnl.       Increased urinary frequency    Recent check - no infection.  No increased bladder retention.  See ER note.  Hold on flomax as requested by daughter.  Check metabolic panel.  May need urological evaluation.        Lower extremity edema    Leg swelling as outlined.  On diuretic now.  Swelling has improved.  Discussed the need for compression hose.  Check metabolic panel with the initiation of diuretic.            [name], MD

## 2015-07-16 ENCOUNTER — Inpatient Hospital Stay
Admission: EM | Admit: 2015-07-16 | Discharge: 2015-07-22 | DRG: 643 | Disposition: A | Discharge status: Home / Self Care | Payer: Medicare Other | Attending: Internal Medicine | Admitting: Internal Medicine

## 2015-07-16 ENCOUNTER — Inpatient Hospital Stay: Payer: Medicare Other

## 2015-07-16 ENCOUNTER — Other Ambulatory Visit: Payer: Self-pay | Admitting: Internal Medicine

## 2015-07-16 ENCOUNTER — Other Ambulatory Visit (INDEPENDENT_AMBULATORY_CARE_PROVIDER_SITE_OTHER): Payer: Medicare Other

## 2015-07-16 ENCOUNTER — Encounter: Payer: Self-pay | Admitting: Emergency Medicine

## 2015-07-16 ENCOUNTER — Telehealth: Payer: Self-pay | Admitting: *Deleted

## 2015-07-16 ENCOUNTER — Other Ambulatory Visit: Payer: Self-pay

## 2015-07-16 ENCOUNTER — Encounter: Payer: Self-pay | Admitting: Internal Medicine

## 2015-07-16 DIAGNOSIS — J441 Chronic obstructive pulmonary disease with (acute) exacerbation: Secondary | ICD-10-CM | POA: Diagnosis present

## 2015-07-16 DIAGNOSIS — Z8711 Personal history of peptic ulcer disease: Secondary | ICD-10-CM | POA: Diagnosis not present

## 2015-07-16 DIAGNOSIS — R339 Retention of urine, unspecified: Secondary | ICD-10-CM | POA: Diagnosis present

## 2015-07-16 DIAGNOSIS — J9612 Chronic respiratory failure with hypercapnia: Secondary | ICD-10-CM | POA: Diagnosis not present

## 2015-07-16 DIAGNOSIS — Z91040 Latex allergy status: Secondary | ICD-10-CM | POA: Diagnosis not present

## 2015-07-16 DIAGNOSIS — J449 Chronic obstructive pulmonary disease, unspecified: Secondary | ICD-10-CM | POA: Insufficient documentation

## 2015-07-16 DIAGNOSIS — Z96641 Presence of right artificial hip joint: Secondary | ICD-10-CM | POA: Diagnosis present

## 2015-07-16 DIAGNOSIS — J9602 Acute respiratory failure with hypercapnia: Secondary | ICD-10-CM | POA: Diagnosis present

## 2015-07-16 DIAGNOSIS — G4700 Insomnia, unspecified: Secondary | ICD-10-CM | POA: Diagnosis present

## 2015-07-16 DIAGNOSIS — M1990 Unspecified osteoarthritis, unspecified site: Secondary | ICD-10-CM | POA: Diagnosis present

## 2015-07-16 DIAGNOSIS — G9340 Encephalopathy, unspecified: Secondary | ICD-10-CM | POA: Diagnosis present

## 2015-07-16 DIAGNOSIS — R350 Frequency of micturition: Secondary | ICD-10-CM | POA: Insufficient documentation

## 2015-07-16 DIAGNOSIS — R0902 Hypoxemia: Secondary | ICD-10-CM | POA: Diagnosis not present

## 2015-07-16 DIAGNOSIS — R911 Solitary pulmonary nodule: Secondary | ICD-10-CM | POA: Diagnosis present

## 2015-07-16 DIAGNOSIS — R400 Somnolence: Secondary | ICD-10-CM

## 2015-07-16 DIAGNOSIS — J9601 Acute respiratory failure with hypoxia: Secondary | ICD-10-CM | POA: Diagnosis present

## 2015-07-16 DIAGNOSIS — F0390 Unspecified dementia without behavioral disturbance: Secondary | ICD-10-CM | POA: Diagnosis present

## 2015-07-16 DIAGNOSIS — Z806 Family history of leukemia: Secondary | ICD-10-CM | POA: Diagnosis not present

## 2015-07-16 DIAGNOSIS — Z833 Family history of diabetes mellitus: Secondary | ICD-10-CM

## 2015-07-16 DIAGNOSIS — Z86010 Personal history of colonic polyps: Secondary | ICD-10-CM | POA: Diagnosis not present

## 2015-07-16 DIAGNOSIS — Z9889 Other specified postprocedural states: Secondary | ICD-10-CM

## 2015-07-16 DIAGNOSIS — E559 Vitamin D deficiency, unspecified: Secondary | ICD-10-CM | POA: Diagnosis present

## 2015-07-16 DIAGNOSIS — N401 Enlarged prostate with lower urinary tract symptoms: Secondary | ICD-10-CM | POA: Diagnosis present

## 2015-07-16 DIAGNOSIS — R0602 Shortness of breath: Secondary | ICD-10-CM | POA: Diagnosis not present

## 2015-07-16 DIAGNOSIS — J9620 Acute and chronic respiratory failure, unspecified whether with hypoxia or hypercapnia: Secondary | ICD-10-CM | POA: Diagnosis not present

## 2015-07-16 DIAGNOSIS — J9690 Respiratory failure, unspecified, unspecified whether with hypoxia or hypercapnia: Secondary | ICD-10-CM

## 2015-07-16 DIAGNOSIS — E1142 Type 2 diabetes mellitus with diabetic polyneuropathy: Secondary | ICD-10-CM | POA: Diagnosis present

## 2015-07-16 DIAGNOSIS — J984 Other disorders of lung: Secondary | ICD-10-CM | POA: Diagnosis not present

## 2015-07-16 DIAGNOSIS — T502X5A Adverse effect of carbonic-anhydrase inhibitors, benzothiadiazides and other diuretics, initial encounter: Secondary | ICD-10-CM | POA: Diagnosis present

## 2015-07-16 DIAGNOSIS — G4730 Sleep apnea, unspecified: Secondary | ICD-10-CM | POA: Diagnosis present

## 2015-07-16 DIAGNOSIS — J45909 Unspecified asthma, uncomplicated: Secondary | ICD-10-CM | POA: Diagnosis present

## 2015-07-16 DIAGNOSIS — Z95828 Presence of other vascular implants and grafts: Secondary | ICD-10-CM

## 2015-07-16 DIAGNOSIS — Z808 Family history of malignant neoplasm of other organs or systems: Secondary | ICD-10-CM

## 2015-07-16 DIAGNOSIS — E785 Hyperlipidemia, unspecified: Secondary | ICD-10-CM | POA: Diagnosis present

## 2015-07-16 DIAGNOSIS — E222 Syndrome of inappropriate secretion of antidiuretic hormone: Secondary | ICD-10-CM | POA: Diagnosis present

## 2015-07-16 DIAGNOSIS — Z87891 Personal history of nicotine dependence: Secondary | ICD-10-CM | POA: Diagnosis not present

## 2015-07-16 DIAGNOSIS — R918 Other nonspecific abnormal finding of lung field: Secondary | ICD-10-CM

## 2015-07-16 DIAGNOSIS — R739 Hyperglycemia, unspecified: Secondary | ICD-10-CM

## 2015-07-16 DIAGNOSIS — E871 Hypo-osmolality and hyponatremia: Secondary | ICD-10-CM

## 2015-07-16 DIAGNOSIS — Z79899 Other long term (current) drug therapy: Secondary | ICD-10-CM

## 2015-07-16 DIAGNOSIS — R3914 Feeling of incomplete bladder emptying: Secondary | ICD-10-CM | POA: Diagnosis not present

## 2015-07-16 DIAGNOSIS — Z888 Allergy status to other drugs, medicaments and biological substances status: Secondary | ICD-10-CM | POA: Diagnosis not present

## 2015-07-16 DIAGNOSIS — I110 Hypertensive heart disease with heart failure: Secondary | ICD-10-CM | POA: Diagnosis present

## 2015-07-16 DIAGNOSIS — R600 Localized edema: Secondary | ICD-10-CM | POA: Insufficient documentation

## 2015-07-16 DIAGNOSIS — K589 Irritable bowel syndrome without diarrhea: Secondary | ICD-10-CM | POA: Diagnosis present

## 2015-07-16 DIAGNOSIS — R351 Nocturia: Secondary | ICD-10-CM | POA: Diagnosis not present

## 2015-07-16 DIAGNOSIS — R0600 Dyspnea, unspecified: Secondary | ICD-10-CM | POA: Diagnosis not present

## 2015-07-16 DIAGNOSIS — R531 Weakness: Secondary | ICD-10-CM | POA: Diagnosis not present

## 2015-07-16 DIAGNOSIS — Z7951 Long term (current) use of inhaled steroids: Secondary | ICD-10-CM | POA: Diagnosis not present

## 2015-07-16 LAB — CBC
HEMATOCRIT: 41.2 % (ref 40.0–52.0)
Hemoglobin: 13.7 g/dL (ref 13.0–18.0)
MCH: 30.9 pg (ref 26.0–34.0)
MCHC: 33.2 g/dL (ref 32.0–36.0)
MCV: 93.1 fL (ref 80.0–100.0)
PLATELETS: 304 10*3/uL (ref 150–440)
RBC: 4.43 MIL/uL (ref 4.40–5.90)
RDW: 12.8 % (ref 11.5–14.5)
WBC: 6.3 10*3/uL (ref 3.8–10.6)

## 2015-07-16 LAB — URINALYSIS COMPLETE WITH MICROSCOPIC (ARMC ONLY)
BACTERIA UA: NONE SEEN
BILIRUBIN URINE: NEGATIVE
GLUCOSE, UA: NEGATIVE mg/dL
HGB URINE DIPSTICK: NEGATIVE
KETONES UR: NEGATIVE mg/dL
LEUKOCYTES UA: NEGATIVE
NITRITE: NEGATIVE
Protein, ur: 100 mg/dL — AB
SPECIFIC GRAVITY, URINE: 1.008 (ref 1.005–1.030)
pH: 7 (ref 5.0–8.0)

## 2015-07-16 LAB — BASIC METABOLIC PANEL
ANION GAP: 13 (ref 5–15)
BUN: 8 mg/dL (ref 6–20)
CO2: 28 mmol/L (ref 22–32)
Calcium: 8.5 mg/dL — ABNORMAL LOW (ref 8.9–10.3)
Chloride: 77 mmol/L — ABNORMAL LOW (ref 101–111)
Creatinine, Ser: 0.78 mg/dL (ref 0.61–1.24)
GFR calc Af Amer: >60 mL/min (ref >60)
Glucose, Bld: 159 mg/dL — ABNORMAL HIGH (ref 65–99)
POTASSIUM: 4.1 mmol/L (ref 3.5–5.1)
SODIUM: 118 mmol/L — AB (ref 135–145)

## 2015-07-16 LAB — CREATININE, URINE, RANDOM: CREATININE, URINE: 43 mg/dL

## 2015-07-16 LAB — SODIUM, URINE, RANDOM: Sodium, Ur: 90 mmol/L

## 2015-07-16 LAB — SODIUM: Sodium: 122 mEq/L — ABNORMAL LOW (ref 135–145)

## 2015-07-16 LAB — OSMOLALITY, URINE: Osmolality, Ur: 294 mOsm/kg — ABNORMAL LOW (ref 300–900)

## 2015-07-16 MED ORDER — ALBUTEROL SULFATE (2.5 MG/3ML) 0.083% IN NEBU: 2.5000 mg | INHALATION_SOLUTION | RESPIRATORY_TRACT | Status: DC | PRN

## 2015-07-16 MED ORDER — POLYETHYLENE GLYCOL 3350 17 G PO PACK
17.0000 g | PACK | Freq: Every day | ORAL | Status: DC | PRN
  Administered 2015-07-19: 09:00:00 17 g via ORAL
  Filled 2015-07-16: qty 1

## 2015-07-16 MED ORDER — TRAMADOL HCL 50 MG PO TABS
50.0000 mg | ORAL_TABLET | Freq: Four times a day (QID) | ORAL | Status: DC
  Administered 2015-07-16 – 2015-07-19 (×11): 50 mg via ORAL
  Filled 2015-07-16 (×12): qty 1

## 2015-07-16 MED ORDER — ONDANSETRON HCL 4 MG/2ML IJ SOLN: 4.0000 mg | Freq: Four times a day (QID) | INTRAMUSCULAR | Status: DC | PRN

## 2015-07-16 MED ORDER — LEVOTHYROXINE SODIUM 50 MCG PO TABS
50.0000 ug | ORAL_TABLET | Freq: Every day | ORAL | Status: DC
  Administered 2015-07-17 – 2015-07-21 (×5): 50 ug via ORAL
  Filled 2015-07-16 (×5): qty 1

## 2015-07-16 MED ORDER — ACETAMINOPHEN 325 MG PO TABS
650.0000 mg | ORAL_TABLET | Freq: Four times a day (QID) | ORAL | Status: DC | PRN
Start: 2015-07-16 — End: 2015-07-22

## 2015-07-16 MED ORDER — RISAQUAD PO CAPS
1.0000 | ORAL_CAPSULE | Freq: Every day | ORAL | Status: DC
  Administered 2015-07-17 – 2015-07-22 (×6): 1 via ORAL
  Filled 2015-07-16 (×6): qty 1

## 2015-07-16 MED ORDER — SODIUM CHLORIDE 0.9 % IV SOLN
INTRAVENOUS | Status: DC
  Administered 2015-07-16 – 2015-07-18 (×3): via INTRAVENOUS

## 2015-07-16 MED ORDER — FLUTICASONE FUROATE-VILANTEROL 100-25 MCG/INH IN AEPB: 1.0000 | INHALATION_SPRAY | Freq: Every day | RESPIRATORY_TRACT | Status: DC

## 2015-07-16 MED ORDER — ASPIRIN EC 81 MG PO TBEC
81.0000 mg | DELAYED_RELEASE_TABLET | Freq: Every day | ORAL | Status: DC
  Administered 2015-07-16 – 2015-07-22 (×7): 81 mg via ORAL
  Filled 2015-07-16 (×7): qty 1

## 2015-07-16 MED ORDER — ONDANSETRON HCL 4 MG PO TABS: 4.0000 mg | ORAL_TABLET | Freq: Four times a day (QID) | ORAL | Status: DC | PRN

## 2015-07-16 MED ORDER — PANTOPRAZOLE SODIUM 40 MG PO TBEC
40.0000 mg | DELAYED_RELEASE_TABLET | Freq: Every day | ORAL | Status: DC
  Administered 2015-07-17 – 2015-07-22 (×6): 40 mg via ORAL
  Filled 2015-07-16 (×6): qty 1

## 2015-07-16 MED ORDER — BUDESONIDE-FORMOTEROL FUMARATE 160-4.5 MCG/ACT IN AERO
2.0000 | INHALATION_SPRAY | Freq: Two times a day (BID) | RESPIRATORY_TRACT | Status: DC
  Administered 2015-07-16 – 2015-07-22 (×12): 2 via RESPIRATORY_TRACT
  Filled 2015-07-16: qty 6

## 2015-07-16 MED ORDER — CLONIDINE HCL 0.1 MG PO TABS
0.1000 mg | ORAL_TABLET | Freq: Two times a day (BID) | ORAL | Status: DC
  Administered 2015-07-16 – 2015-07-19 (×6): 0.1 mg via ORAL
  Filled 2015-07-16 (×6): qty 1

## 2015-07-16 MED ORDER — MOMETASONE FURO-FORMOTEROL FUM 100-5 MCG/ACT IN AERO
2.0000 | INHALATION_SPRAY | Freq: Two times a day (BID) | RESPIRATORY_TRACT | Status: DC
  Administered 2015-07-17: 08:00:00 2 via RESPIRATORY_TRACT
  Filled 2015-07-16: qty 8.8

## 2015-07-16 MED ORDER — ENSURE ENLIVE PO LIQD
237.0000 mL | Freq: Two times a day (BID) | ORAL | Status: DC
  Administered 2015-07-17 – 2015-07-21 (×7): 237 mL via ORAL

## 2015-07-16 MED ORDER — SODIUM CHLORIDE 0.9 % IJ SOLN: 3.0000 mL | INTRAMUSCULAR | Status: DC | PRN

## 2015-07-16 MED ORDER — SODIUM CHLORIDE 0.9 % IV SOLN: 250.0000 mL | INTRAVENOUS | Status: DC | PRN

## 2015-07-16 MED ORDER — SODIUM CHLORIDE 0.9 % IV BOLUS (SEPSIS)
1000.0000 mL | Freq: Once | INTRAVENOUS | Status: AC
  Administered 2015-07-16: 1000 mL via INTRAVENOUS

## 2015-07-16 MED ORDER — SODIUM CHLORIDE 0.9 % IJ SOLN
3.0000 mL | Freq: Two times a day (BID) | INTRAMUSCULAR | Status: DC
  Administered 2015-07-18 – 2015-07-21 (×4): 3 mL via INTRAVENOUS

## 2015-07-16 MED ORDER — ENOXAPARIN SODIUM 40 MG/0.4ML ~~LOC~~ SOLN
40.0000 mg | SUBCUTANEOUS | Status: DC
  Administered 2015-07-16 – 2015-07-21 (×6): 40 mg via SUBCUTANEOUS
  Filled 2015-07-16 (×7): qty 0.4

## 2015-07-16 MED ORDER — HYDROCODONE-ACETAMINOPHEN 5-325 MG PO TABS: 1.0000 | ORAL_TABLET | ORAL | Status: DC | PRN

## 2015-07-16 MED ORDER — ACETAMINOPHEN 650 MG RE SUPP: 650.0000 mg | Freq: Four times a day (QID) | RECTAL | Status: DC | PRN

## 2015-07-16 MED ORDER — METOPROLOL TARTRATE 25 MG PO TABS
25.0000 mg | ORAL_TABLET | Freq: Two times a day (BID) | ORAL | Status: DC
  Administered 2015-07-16 – 2015-07-22 (×10): 25 mg via ORAL
  Filled 2015-07-16 (×11): qty 1

## 2015-07-16 MED ORDER — HYDRALAZINE HCL 20 MG/ML IJ SOLN: 10.0000 mg | Freq: Four times a day (QID) | INTRAMUSCULAR | Status: DC | PRN

## 2015-07-16 MED ORDER — IPRATROPIUM BROMIDE 0.03 % NA SOLN
2.0000 | Freq: Four times a day (QID) | NASAL | Status: DC
  Administered 2015-07-16 – 2015-07-19 (×11): 2 via NASAL
  Filled 2015-07-16: qty 30

## 2015-07-16 MED ORDER — IPRATROPIUM BROMIDE 0.06 % NA SOLN
2.0000 | Freq: Four times a day (QID) | NASAL | Status: DC
  Filled 2015-07-16: qty 15

## 2015-07-16 MED ORDER — TIOTROPIUM BROMIDE MONOHYDRATE 18 MCG IN CAPS
18.0000 ug | ORAL_CAPSULE | Freq: Every day | RESPIRATORY_TRACT | Status: DC
  Administered 2015-07-17 – 2015-07-22 (×6): 18 ug via RESPIRATORY_TRACT
  Filled 2015-07-16: qty 5

## 2015-07-16 MED ORDER — AMLODIPINE BESYLATE 5 MG PO TABS
5.0000 mg | ORAL_TABLET | Freq: Every day | ORAL | Status: DC
  Administered 2015-07-17: 10:00:00 5 mg via ORAL
  Filled 2015-07-16: qty 1

## 2015-07-16 MED ORDER — ZOLPIDEM TARTRATE 5 MG PO TABS: 5.0000 mg | ORAL_TABLET | Freq: Every evening | ORAL | Status: DC | PRN

## 2015-07-16 MED ORDER — LOSARTAN POTASSIUM 50 MG PO TABS
50.0000 mg | ORAL_TABLET | Freq: Every day | ORAL | Status: DC
  Administered 2015-07-16 – 2015-07-22 (×7): 50 mg via ORAL
  Filled 2015-07-16 (×6): qty 1
  Filled 2015-07-16: qty 2

## 2015-07-16 NOTE — Assessment & Plan Note (Signed)
Recent sodium low.  Hospitalized.  See note.  Improved sodium with treatment.  Sodium appears to be trending down.  Last check 131.  On hyzaar now.  Some decreased po food intake with increased urinary frequency and nocturia.  Recheck metabolic panel today.

## 2015-07-16 NOTE — Assessment & Plan Note (Signed)
Sugars had been doing well on no medication.  Follow.  Continue to encourage increased po intake.

## 2015-07-16 NOTE — Assessment & Plan Note (Signed)
Treated in hospital.  Seeing pulmonary.

## 2015-07-16 NOTE — Telephone Encounter (Signed)
Placed in green folder

## 2015-07-16 NOTE — Assessment & Plan Note (Signed)
Blood pressure as outlined.  Off amlodipine now.  Tapering clonidine.  Continue metoprolol.   Was just recently started on hyzaar.  Has had issues with low sodium as outlined.  Last sodium 131 - decrease from previous.  Will recheck metabolic panel today.  Will need to watch electrolytes closely.  Now on a diuretic - follow sodium and potassium.  May need to adjust medications more.

## 2015-07-16 NOTE — Progress Notes (Signed)
Order placed for stat sodium.

## 2015-07-16 NOTE — ED Provider Notes (Signed)
[facility] Emergency Department Provider Note  ____________________________________________  Time seen: Approximately 1:36 PM  I have reviewed the triage vital signs and the nursing notes.   HISTORY  Chief Complaint abnormal labs     HPI Aaron Hendrix is a 81 y.o. male with a possible right upper lung mass of unknown etiology, BPH, DM presenting for hyponatremia. Patient is slightly somnolent, so most of the recent history is obtained from the patient's wife and on the phone with the patient's daughter. Over the past 3 weeks, the patient has been having increased nighttime urination, which has been resulting in decreased sleep. Then he has increased sleep during the day. His primary care physician has been following his sodium which has been continually trending down; 122 today. His diuretic was discontinued 2 days ago. He denies any fever, chills, nausea or vomiting, pain, shortness of breath, or cough.   Past Medical History  Diagnosis Date  . COPD with asthma ([place])   . Diabetes mellitus     Diet control   . Hyperlipidemia   . Carpal tunnel syndrome   . DJD (degenerative joint disease), cervical   . Vitamin D deficiency   . Tremor, essential   . Internal hemorrhoids   . Personal history of colonic polyps     adenomatous  . IBS (irritable bowel syndrome)   . Peptic ulcer   . Duodenal ulcer, with partial obstruction 08/10/2012  . Numbness and tingling in right hand     started 2 yeas ago  . Hypertension     no medicine needed  . Memory deficit 12/16/2013  . Essential and other specified forms of tremor 12/16/2013  . Polyneuropathy in diabetes(357.2)     Patient Active Problem List   Diagnosis Date Noted  . Lower extremity edema 07/16/2015  . Increased urinary frequency 07/16/2015  . COPD (chronic obstructive pulmonary disease) ([place]) 07/16/2015  . Malnutrition of moderate degree ([place]) 07/02/2015  . Community acquired pneumonia   .  Hyponatremia 06/25/2015  . Skin lesion 02/16/2015  . Health care maintenance 02/16/2015  . Hand laceration 09/27/2014  . Hypothyroidism 09/27/2014  . Trigger thumb 06/22/2014  . Abdominal pain 06/22/2014  . Memory deficit 12/16/2013  . Essential and other specified forms of tremor 12/16/2013  . Diarrhea 10/24/2013  . Loss of weight 09/10/2013  . Periumbilical abdominal pain 09/10/2013  . Pain in lower limb 07/01/2013  . Dermatophytosis of foot 07/01/2013  . Degenerative arthritis of hip 11/13/2012  . Nausea 08/10/2012  . Hypertension 05/25/2011  . Diabetes mellitus, type II ([place]) 05/25/2011  . Irritable bowel syndrome 03/31/2008  . History of colonic polyps 03/31/2008    Past Surgical History  Procedure Laterality Date  . Anal fissure repair    . Colonoscopy w/ biopsies and polypectomy  8/03, 6/05, 7/09, 9/10    internal hemorrhoids, tubular adenomas, mucosa & lymphoid nodules  . Upper gastrointestinal endoscopy  3/05, 7/09, 9/10,2013    gastritis, duodenitis  . Total hip arthroplasty Right 11/13/2012    Procedure: TOTAL HIP ARTHROPLASTY ANTERIOR APPROACH;  Surgeon: Mcarthur Rossetti, MD;  Location: [place];  Service: Orthopedics;  Laterality: Right;  Right total hip arthroplasty  . Ankle surgery Right     right- pins placed in    Current Outpatient Rx  Name  Route  Sig  Dispense  Refill  . acetaminophen (TYLENOL) 500 MG tablet   Oral   Take 500 mg by mouth every 6 (six) hours as needed.         Marland Kitchen  acidophilus (RISAQUAD) CAPS capsule   Oral   Take 2 capsules by mouth daily.   60 capsule   0   . albuterol (PROVENTIL) (2.5 MG/3ML) 0.083% nebulizer solution   Nebulization   Take 3 mLs (2.5 mg total) by nebulization every 6 (six) hours as needed for wheezing or shortness of breath.   75 mL   12   . amLODipine (NORVASC) 5 MG tablet   Oral   Take 1 tablet (5 mg total) by mouth daily. Patient not taking: Reported on 07/15/2015   30 tablet   0   . cloNIDine  (CATAPRES) 0.1 MG tablet   Oral   Take 1 tablet (0.1 mg total) by mouth 2 (two) times daily.   60 tablet   11   . Docusate Calcium (STOOL SOFTENER PO)   Oral   Take by mouth.         . feeding supplement, ENSURE ENLIVE, (ENSURE ENLIVE) LIQD   Oral   Take 237 mLs by mouth 2 (two) times daily between meals.   237 mL   12   . Fluticasone Furoate-Vilanterol 100-25 MCG/INH AEPB   Inhalation   Inhale 1 puff into the lungs daily.   14 each   0   . ipratropium (ATROVENT) 0.06 % nasal spray   Nasal   Place 2 sprays into the nose daily as needed.          Marland Kitchen levothyroxine (SYNTHROID, LEVOTHROID) 50 MCG tablet   Oral   Take 50 mcg by mouth daily before breakfast.         . loratadine (CLARITIN) 10 MG tablet   Oral   Take 10 mg by mouth daily.         Marland Kitchen losartan-hydrochlorothiazide (HYZAAR) 50-12.5 MG tablet   Oral   Take 1 tablet by mouth daily.   30 tablet   11   . metoprolol tartrate (LOPRESSOR) 25 MG tablet   Oral   Take 1 tablet (25 mg total) by mouth 2 (two) times daily.   60 tablet   0   . mometasone-formoterol (DULERA) 100-5 MCG/ACT AERO   Inhalation   Inhale 2 puffs into the lungs 2 (two) times daily.   1 Inhaler   0   . phenazopyridine (PYRIDIUM) 100 MG tablet   Oral   Take 1 tablet (100 mg total) by mouth 2 (two) times daily before a meal.   2 tablet   0   . saccharomyces boulardii (FLORASTOR) 250 MG capsule   Oral   Take 250 mg by mouth daily.         Marland Kitchen tiotropium (SPIRIVA) 18 MCG inhalation capsule   Inhalation   Place 1 capsule (18 mcg total) into inhaler and inhale daily.   30 capsule   12   . traMADol (ULTRAM) 50 MG tablet   Oral   Take by mouth at bedtime as needed.         . zolpidem (AMBIEN) 5 MG tablet   Oral   Take 5 mg by mouth at bedtime as needed for sleep.           Allergies Fexofenadine; Quinapril hcl; and Latex  Family History  Problem Relation Age of Onset  . Colon cancer Neg Hx   . Esophageal cancer Neg  Hx   . Rectal cancer Neg Hx   . Stomach cancer Neg Hx   . Leukemia Maternal Aunt   . Thyroid cancer Daughter 65  . Diabetes Son  Social History Social History  Substance Use Topics  . Smoking status: Former Smoker    Types: Cigarettes    Quit date: 07/02/1963  . Smokeless tobacco: Never Used  . Alcohol Use: 0.0 oz/week    0 Standard drinks or equivalent per week     Comment: occasional    Review of Systems Constitutional: No fever/chills. Plus generalized weakness plus generalized fatigue. Eyes: No visual changes. ENT: No sore throat. Cardiovascular: Denies chest pain, palpitations. Respiratory: Denies shortness of breath.  No cough. Gastrointestinal: No abdominal pain.  No nausea, no vomiting.  No diarrhea.  No constipation. Genitourinary: Negative for dysuria. Positive for increased frequency, especially at nighttime. Musculoskeletal: Negative for back pain. Skin: Negative for rash. Neurological: Negative for headaches, focal weakness or numbness.  10-point ROS otherwise negative.  ____________________________________________   PHYSICAL EXAM:  VITAL SIGNS: ED Triage Vitals  Enc Vitals Group     BP 07/16/15 1229 153/64 mmHg     Pulse Rate 07/16/15 1229 64     Resp 07/16/15 1229 20     Temp 07/16/15 1229 98.1 F (36.7 C)     Temp Source 07/16/15 1229 Oral     SpO2 07/16/15 1229 97 %     Weight 07/16/15 1229 135 lb (61.236 kg)     Height 07/16/15 1229 '5\' 4"'$  (1.626 m)     Head Cir --      Peak Flow --      Pain Score 07/16/15 1230 0     Pain Loc --      Pain Edu? --      Excl. in [place]? --     Constitutional: Patient is asleep but awakens to verbal stimulus. He is oriented and answers questions appropriately.  Eyes: Conjunctivae are normal.  EOMI. Head: Atraumatic. Nose: No congestion/rhinnorhea. Mouth/Throat: Mucous membranes are moist.  Neck: No stridor.  Supple.   Cardiovascular: Normal rate, regular rhythm. No murmurs, rubs or gallops.  Respiratory:  Normal respiratory effort with prolonged expiratory phase..  No retractions or accessory muscle use.. Lungs CTAB.  No wheezes, rales or ronchi. Gastrointestinal: Soft and nontender. No distention. No peritoneal signs. Musculoskeletal: Positive for symmetric bilateral lower extremity edema to the mid tibial shaft. Neurologic:  Normal speech and language. And smile are symmetric. EOMI. No gross focal neurologic deficits are appreciated.  Skin:  Skin is warm, dry and intact. No rash noted. Psychiatric: Mood and affect are normal. Speech and behavior are normal.  Normal judgement.  ____________________________________________   LABS (all labs ordered are listed, but only abnormal results are displayed)  Labs Reviewed  BASIC METABOLIC PANEL - Abnormal; Notable for the following:    Sodium 118 (*)    Chloride 77 (*)    Glucose, Bld 159 (*)    Calcium 8.5 (*)    All other components within normal limits  CBC  URINALYSIS COMPLETEWITH MICROSCOPIC ([facility] ONLY)   ____________________________________________  EKG  ED ECG REPORT I, Eula Listen, the attending physician, personally viewed and interpreted this ECG.   Date: 07/16/2015  EKG Time: 1814  Rate: 66  Rhythm: normal sinus rhythm with occasional PVC.  Axis: Leftward  Intervals:right bundle branch block, left anterior fascicular block.  ST&T Change: No ST elevation.  ____________________________________________  RADIOLOGY  No results found.  ____________________________________________   PROCEDURES  Procedure(s) performed: None  Critical Care performed: No ____________________________________________   INITIAL IMPRESSION / ASSESSMENT AND PLAN / ED COURSE  Pertinent labs & imaging results that were available during my care  of the patient were reviewed by me and considered in my medical decision making (see chart for details).  81 y.o. male presenting with hyponatremia. There are multiple possible causes  including a yet undetermined lung mass which may be a lung cancer which would precipitate hyponatremia. Consider diuretic use. I will plan to have the patient admitted.  ----------------------------------------- 2:19 PM on 07/16/2015 -----------------------------------------  Patient's sodium here is 118. I have initiated fluids and will speak to the hospitalist for [place]. ____________________________________________  FINAL CLINICAL IMPRESSION(S) / ED DIAGNOSES  Final diagnoses:  Hyponatremia  Somnolence  Urinary frequency  Hyperglycemia      NEW MEDICATIONS STARTED DURING THIS VISIT:  New Prescriptions   No medications on file     Eula Listen, MD 07/16/15 1419

## 2015-07-16 NOTE — Assessment & Plan Note (Signed)
Leg swelling as outlined.  On diuretic now.  Swelling has improved.  Discussed the need for compression hose.  Check metabolic panel with the initiation of diuretic.

## 2015-07-16 NOTE — ED Notes (Signed)
Pt sent over from Dr Lars Mage office with low sodium.

## 2015-07-16 NOTE — Plan of Care (Signed)
Problem: Discharge Progression Outcomes Goal: Other Discharge Outcomes/Goals Outcome: Progressing Plan of care progress to goal: Pt admitted with hyponatremia. IVF infusing. No c/o pain. Wife at the bedside. Pt using urinal and BSC. VSS. Tolerating diet.

## 2015-07-16 NOTE — Telephone Encounter (Signed)
Patient's wife left a note for Dr. Nicki Reaper. The note is in the box.

## 2015-07-16 NOTE — Assessment & Plan Note (Signed)
Recent check - no infection.  No increased bladder retention.  See ER note.  Hold on flomax as requested by daughter.  Check metabolic panel.  May need urological evaluation.

## 2015-07-16 NOTE — H&P (Signed)
[facility] [place] NAME: Aaron Hendrix    MR#:  [id number]  DATE OF BIRTH:  1929-12-26  DATE OF ADMISSION:  07/16/2015  PRIMARY CARE PHYSICIAN: [name], MD   REQUESTING/REFERRING PHYSICIAN: Dr. Mariea Clonts  CHIEF COMPLAINT:   Chief Complaint  Patient presents with  . abnormal labs     HISTORY OF PRESENT ILLNESS:  Aaron Hendrix  is a 81 y.o. male with a known history of COPD, right upper lobe lung mass, hypertension, diabetes presents to the emergency room sent in by his primary care physician due to hyponatremia. Patient was recently admitted to the hospital for similar complaints. His sodium had corrected to 140. At this time patient's main complaint is just generalized weakness. No confusion or seizures. Normal urination. Patient is off his diuretics. Patient saw pulmonology Dr. Rosita Fire as outpatient. Also Dr. Oliva Bustard of oncology. Plan is to get a PET scan as outpatient for further workup if of his right upper lobe lung mass.  PAST MEDICAL HISTORY:   Past Medical History  Diagnosis Date  . COPD with asthma ([place])   . Diabetes mellitus     Diet control   . Hyperlipidemia   . Carpal tunnel syndrome   . DJD (degenerative joint disease), cervical   . Vitamin D deficiency   . Tremor, essential   . Internal hemorrhoids   . Personal history of colonic polyps     adenomatous  . IBS (irritable bowel syndrome)   . Peptic ulcer   . Duodenal ulcer, with partial obstruction 08/10/2012  . Numbness and tingling in right hand     started 2 yeas ago  . Hypertension     no medicine needed  . Memory deficit 12/16/2013  . Essential and other specified forms of tremor 12/16/2013  . Polyneuropathy in diabetes(357.2)     PAST SURGICAL HISTORY:   Past Surgical History  Procedure Laterality Date  . Anal fissure repair    . Colonoscopy w/ biopsies and polypectomy  8/03, 6/05, 7/09, 9/10    internal hemorrhoids, tubular  adenomas, mucosa & lymphoid nodules  . Upper gastrointestinal endoscopy  3/05, 7/09, 9/10,2013    gastritis, duodenitis  . Total hip arthroplasty Right 11/13/2012    Procedure: TOTAL HIP ARTHROPLASTY ANTERIOR APPROACH;  Surgeon: Mcarthur Rossetti, MD;  Location: [place] [facility];  Service: Orthopedics;  Laterality: Right;  Right total hip arthroplasty  . Ankle surgery Right     right- pins placed in    SOCIAL HISTORY:   Social History  Substance Use Topics  . Smoking status: Former Smoker    Types: Cigarettes    Quit date: 07/02/1963  . Smokeless tobacco: Never Used  . Alcohol Use: 0.0 oz/week    0 Standard drinks or equivalent per week     Comment: occasional    FAMILY HISTORY:   Family History  Problem Relation Age of Onset  . Colon cancer Neg Hx   . Esophageal cancer Neg Hx   . Rectal cancer Neg Hx   . Stomach cancer Neg Hx   . Leukemia Maternal Aunt   . Thyroid cancer Daughter 75  . Diabetes Son     DRUG ALLERGIES:   Allergies  Allergen Reactions  . Fexofenadine Other (See Comments)  . Quinapril Hcl Other (See Comments)    Unknown   . Latex Rash    REVIEW OF SYSTEMS:   Review of Systems  Constitutional: Positive for malaise/fatigue. Negative for fever, chills and  weight loss.  HENT: Negative for hearing loss and nosebleeds.   Eyes: Negative for blurred vision, double vision and pain.  Respiratory: Positive for cough. Negative for hemoptysis, sputum production, shortness of breath and wheezing.   Cardiovascular: Negative for chest pain, palpitations, orthopnea and leg swelling.  Gastrointestinal: Negative for nausea, vomiting, abdominal pain, diarrhea and constipation.  Genitourinary: Positive for urgency. Negative for dysuria and hematuria.  Musculoskeletal: Negative for myalgias, back pain and falls.  Skin: Negative for rash.  Neurological: Positive for weakness. Negative for dizziness, tremors, sensory change, speech change, focal weakness, seizures and  headaches.  Endo/Heme/Allergies: Does not bruise/bleed easily.  Psychiatric/Behavioral: Positive for memory loss. Negative for depression. The patient is not nervous/anxious.     MEDICATIONS AT HOME:   Prior to Admission medications   Medication Sig Start Date End Date Taking? Authorizing Provider  acetaminophen (TYLENOL) 500 MG tablet Take 500 mg by mouth every 6 (six) hours as needed.   Yes Historical Provider, MD  albuterol (PROVENTIL) (2.5 MG/3ML) 0.083% nebulizer solution Take 3 mLs (2.5 mg total) by nebulization every 6 (six) hours as needed for wheezing or shortness of breath. 07/13/15  Yes [name], MD  budesonide-formoterol [facility]) 160-4.5 MCG/ACT inhaler Inhale 2 puffs into the lungs 2 (two) times daily.   Yes Historical Provider, MD  cloNIDine (CATAPRES) 0.1 MG tablet Take 1 tablet (0.1 mg total) by mouth 2 (two) times daily. 07/06/15  Yes Fritzi Mandes, MD  Docusate Calcium (STOOL SOFTENER PO) Take by mouth.   Yes Historical Provider, MD  feeding supplement, ENSURE ENLIVE, (ENSURE ENLIVE) LIQD Take 237 mLs by mouth 2 (two) times daily between meals. 07/06/15  Yes Fritzi Mandes, MD  Fluticasone Furoate-Vilanterol 100-25 MCG/INH AEPB Inhale 1 puff into the lungs daily. 07/13/15  Yes [name], MD  ipratropium (ATROVENT) 0.06 % nasal spray Place 2 sprays into the nose daily as needed.    Yes Historical Provider, MD  levothyroxine (SYNTHROID, LEVOTHROID) 50 MCG tablet Take 50 mcg by mouth daily before breakfast.   Yes Reynold Bowen, MD  losartan-hydrochlorothiazide (HYZAAR) 50-12.5 MG tablet Take 1 tablet by mouth daily. 07/13/15 07/12/16 Yes [name], MD  metoprolol tartrate (LOPRESSOR) 25 MG tablet Take 1 tablet (25 mg total) by mouth 2 (two) times daily. 07/06/15  Yes Fritzi Mandes, MD  pantoprazole (PROTONIX) 40 MG tablet Take 40 mg by mouth daily.   Yes Historical Provider, MD  saccharomyces boulardii (FLORASTOR) 250 MG capsule Take 250 mg by mouth daily.   Yes  Historical Provider, MD  tiotropium (SPIRIVA) 18 MCG inhalation capsule Place 1 capsule (18 mcg total) into inhaler and inhale daily. 07/06/15  Yes Fritzi Mandes, MD  traMADol (ULTRAM) 50 MG tablet Take by mouth at bedtime as needed.   Yes Historical Provider, MD  zolpidem (AMBIEN) 5 MG tablet Take 5 mg by mouth at bedtime as needed for sleep.   Yes Historical Provider, MD  amLODipine (NORVASC) 5 MG tablet Take 1 tablet (5 mg total) by mouth daily. Patient not taking: Reported on 07/15/2015 07/06/15   Fritzi Mandes, MD      VITAL SIGNS:  Blood pressure 171/115, pulse 91, temperature 98.1 F (36.7 C), temperature source Oral, resp. rate 18, height '5\' 4"'$  (1.626 m), weight 61.236 kg (135 lb), SpO2 99 %.  PHYSICAL EXAMINATION:  Physical Exam  GENERAL:  81 y.o.-year-old patient lying in the bed with no acute distress. Thin EYES: Pupils equal, round, reactive to light and accommodation. No scleral icterus. Extraocular  muscles intact.  HEENT: Head atraumatic, normocephalic. Oropharynx and nasopharynx clear. No oropharyngeal erythema, moist oral mucosa  NECK:  Supple, no jugular venous distention. No thyroid enlargement, no tenderness.  LUNGS: Normal breath sounds bilaterally, no wheezing, rales, rhonchi. No use of accessory muscles of respiration.  CARDIOVASCULAR: S1, S2 normal. No murmurs, rubs, or gallops.  ABDOMEN: Soft, nontender, nondistended. Bowel sounds present. No organomegaly or mass.  EXTREMITIES: No pedal edema, cyanosis, or clubbing. + 2 pedal & radial pulses b/l.   NEUROLOGIC: Cranial nerves II through XII are intact. No focal Motor or sensory deficits appreciated b/l PSYCHIATRIC: The patient is alert and oriented x 3. Good affect.  SKIN: No obvious rash, lesion, or ulcer.   LABORATORY PANEL:   CBC  Recent Labs Lab 07/16/15 1241  WBC 6.3  HGB 13.7  HCT 41.2  PLT 304    ------------------------------------------------------------------------------------------------------------------  Chemistries   Recent Labs Lab 07/16/15 1241  NA 118*  K 4.1  CL 77*  CO2 28  GLUCOSE 159*  BUN 8  CREATININE 0.78  CALCIUM 8.5*   ------------------------------------------------------------------------------------------------------------------  Cardiac Enzymes No results for input(s): TROPONINI in the last 168 hours. ------------------------------------------------------------------------------------------------------------------  RADIOLOGY:  No results found.   IMPRESSION AND PLAN:   * Hyponatremia, euvolemic Start patient on normal saline at 50 ML per hour. Check urine sodium, osmolality and creatinine. Consult nephrology. 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  * Weakness Due to sodium.  * Right upper lobe lung mass Repeat chest x-ray. He is scheduled for a PET scan as outpatient with Dr. Oliva Bustard.  * COPD Continue home inhalers and as needed nebulizers.  * Diabetes mellitus Continue home medications along with sliding scale insulin.  * Hypertension Hold hydrochlorothiazide. Continue losartan, clonidine, amlodipine. Metoprolol. Will add hydralazine IV when necessary. Can increase dose of amlodipine and clonidine if blood pressure is uncontrolled.  * DVT prophylaxis with Lovenox  All the records are reviewed and case discussed with ED provider. Management plans discussed with the patient, family and they are in agreement.  CODE STATUS: FULL  TOTAL TIME TAKING CARE OF THIS PATIENT: 40 minutes.    Hillary Bow R M.D on 07/16/2015 at 3:14 PM  Between 7am to 6pm - Pager - [phone]  After 6pm go to [email or web address] - password [email or web address] [facility]  Office  [phone]  CC: Primary care physician; [name], MD   Note: This dictation was prepared with Dragon dictation along with smaller phrase  technology. Any transcriptional errors that result from this process are unintentional.

## 2015-07-16 NOTE — Assessment & Plan Note (Signed)
On thyroid replacement.  Recent tsh wnl.

## 2015-07-16 NOTE — Assessment & Plan Note (Signed)
Recently admitted with acute exacerbation.  Saw pulmonary.  On breo and spiriva.  Breathing better.  Follow.

## 2015-07-17 LAB — BASIC METABOLIC PANEL
Anion gap: 7 (ref 5–15)
Anion gap: 8 (ref 5–15)
BUN: 8 mg/dL (ref 6–20)
BUN: 9 mg/dL (ref 6–20)
CALCIUM: 8.5 mg/dL — AB (ref 8.9–10.3)
CALCIUM: 8.1 mg/dL — AB (ref 8.9–10.3)
CHLORIDE: 84 mmol/L — AB (ref 101–111)
CO2: 35 mmol/L — ABNORMAL HIGH (ref 22–32)
CO2: 30 mmol/L (ref 22–32)
CREATININE: 0.69 mg/dL (ref 0.61–1.24)
CREATININE: 0.76 mg/dL (ref 0.61–1.24)
Chloride: 84 mmol/L — ABNORMAL LOW (ref 101–111)
GFR calc non Af Amer: >60 mL/min (ref >60)
GFR calc non Af Amer: >60 mL/min (ref >60)
Glucose, Bld: 112 mg/dL — ABNORMAL HIGH (ref 65–99)
Glucose, Bld: 140 mg/dL — ABNORMAL HIGH (ref 65–99)
Potassium: 3.6 mmol/L (ref 3.5–5.1)
Potassium: 3.9 mmol/L (ref 3.5–5.1)
SODIUM: 126 mmol/L — AB (ref 135–145)
SODIUM: 122 mmol/L — AB (ref 135–145)

## 2015-07-17 MED ORDER — AMLODIPINE BESYLATE 10 MG PO TABS
10.0000 mg | ORAL_TABLET | Freq: Every day | ORAL | Status: DC
  Administered 2015-07-18: 10 mg via ORAL
  Filled 2015-07-17: qty 1

## 2015-07-17 MED ORDER — TAMSULOSIN HCL 0.4 MG PO CAPS
0.4000 mg | ORAL_CAPSULE | Freq: Every day | ORAL | Status: DC
  Administered 2015-07-17 – 2015-07-22 (×6): 0.4 mg via ORAL
  Filled 2015-07-17 (×6): qty 1

## 2015-07-17 MED ORDER — AMLODIPINE BESYLATE 5 MG PO TABS
5.0000 mg | ORAL_TABLET | Freq: Once | ORAL | Status: AC
  Administered 2015-07-17: 14:00:00 5 mg via ORAL
  Filled 2015-07-17: qty 1

## 2015-07-17 NOTE — Care Management Note (Signed)
Case Management Note  Patient Details  Name: Aaron Hendrix MRN: [id number] Date of Birth: Jun 11, 1930  Subjective/Objective:                 Patient presents from home with Hyponatremia.  In past admissions SNF was recommened, however patient declined.  Patient lives at home with wife, and has support of daughter.  Patient is open to [facility] with RN, Aide, PT, and SW.  Patient is satisfied with [name] and would like to resume at time of discharge.  Patient was provided with nebulizer by [facility] on previous admission.  Message left with Stanton Kidney from [facility] to notify of admission   Action/Plan: Resumption of home health care orders needed at time of discharge  Expected Discharge Date:                  Expected Discharge Plan:     In-House Referral:     Discharge planning Services     Post Acute Care Choice:    Choice offered to:     DME Arranged:    DME Agency:     HH Arranged:    [place] Agency:     Status of Service:     Medicare Important Message Given:    Date Medicare IM Given:    Medicare IM give by:    Date Additional Medicare IM Given:    Additional Medicare Important Message give by:     If discussed at [place] of Stay Meetings, dates discussed:    Additional Comments:  Beverly Sessions, RN 07/17/2015, 3:20 PM

## 2015-07-17 NOTE — Progress Notes (Signed)
[facility] [place] NAME: Aaron Hendrix    MR#:  [id number]  DATE OF BIRTH:  Apr 28, 1930  SUBJECTIVE: 81 year old male admitted for hyponatremia. Sodium was 118 on admission. Today it is 126. Patient has been having poor by mouth intake for  like 2 days, recently started on diuretics. Complains of urine frequency, urgency incomplete bladder emptying.   CHIEF COMPLAINT:   Chief Complaint  Patient presents with  . abnormal labs     REVIEW OF SYSTEMS:   ROS CONSTITUTIONAL: No fever, fatigue or weakness.  EYES: No blurred or double vision.  EARS, NOSE, AND THROAT: No tinnitus or ear pain.  RESPIRATORY: No cough, shortness of breath, wheezing or hemoptysis.  CARDIOVASCULAR: No chest pain, orthopnea, edema.  GASTROINTESTINAL: No nausea, vomiting, diarrhea or abdominal pain.  GENITOURINARY: No dysuria, hematuria.  ENDOCRINE: No polyuria, nocturia,  HEMATOLOGY: No anemia, easy bruising or bleeding SKIN: No rash or lesion. MUSCULOSKELETAL: No joint pain or arthritis.   NEUROLOGIC: No tingling, numbness, weakness.  PSYCHIATRY: No anxiety or depression.   DRUG ALLERGIES:   Allergies  Allergen Reactions  . Fexofenadine Other (See Comments)  . Quinapril Hcl Other (See Comments)    Unknown   . Latex Rash    VITALS:  Blood pressure 179/73, pulse 68, temperature 97.9 F (36.6 C), temperature source Oral, resp. rate 18, height '5\' 4"'$  (1.626 m), weight 61.871 kg (136 lb 6.4 oz), SpO2 96 %.  PHYSICAL EXAMINATION:  GENERAL:  81 y.o.-year-old patient lying in the bed with no acute distress.  EYES: Pupils equal, round, reactive to light and accommodation. No scleral icterus. Extraocular muscles intact.  HEENT: Head atraumatic, normocephalic. Oropharynx and nasopharynx clear.  NECK:  Supple, no jugular venous distention. No thyroid enlargement, no tenderness.  LUNGS: Normal breath sounds bilaterally, no wheezing, rales,rhonchi or  crepitation. No use of accessory muscles of respiration.  CARDIOVASCULAR: S1, S2 normal. No murmurs, rubs, or gallops.  ABDOMEN: Soft, nontender, nondistended. Bowel sounds present. No organomegaly or mass.  EXTREMITIES: No pedal edema, cyanosis, or clubbing.  NEUROLOGIC: Cranial nerves II through XII are intact. Muscle strength 5/5 in all extremities. Sensation intact. Gait not checked.  PSYCHIATRIC: The patient is alert and oriented x 3.  SKIN: No obvious rash, lesion, or ulcer.    LABORATORY PANEL:   CBC  Recent Labs Lab 07/16/15 1241  WBC 6.3  HGB 13.7  HCT 41.2  PLT 304   ------------------------------------------------------------------------------------------------------------------  Chemistries   Recent Labs Lab 07/17/15 0429  NA 126*  K 3.6  CL 84*  CO2 35*  GLUCOSE 112*  BUN 8  CREATININE 0.69  CALCIUM 8.5*   ------------------------------------------------------------------------------------------------------------------  Cardiac Enzymes No results for input(s): TROPONINI in the last 168 hours. ------------------------------------------------------------------------------------------------------------------  RADIOLOGY:  Dg Chest 2 View  07/16/2015  CLINICAL DATA:  Hyponatremia.  Right upper lobe mass.  COPD. EXAM: CHEST  2 VIEW COMPARISON:  07/02/2015 FINDINGS: Left jugular central venous catheter is been removed since previous study. Nodular density again seen in the right upper lobe, better visualized on recent chest CT of 06/25/2015. No evidence of pulmonary consolidation or edema. No evidence of pneumothorax or pleural effusion. IMPRESSION: No acute findings. Right upper lobe nodular density, better demonstrated on recent chest CT of 06/25/2015. Electronically Signed   By: Earle Gell M.D.   On: 07/16/2015 16:02    EKG:   Orders placed or performed during the hospital encounter of 07/16/15  . ED EKG  . ED  EKG  . ED EKG  . ED EKG    ASSESSMENT  AND PLAN:   Hyponatremia; Recurrent: Likely secondary to poor by mouth intake, diuretic induced. Patient right now getting normal saline at 1 50 cc an hour: Sodium is improving from 118 to 126. today. Appreciate nephrology following. Daughter wants to talk her nephrologist. Continue normal saline at 50 cc an hour  #2 possible BPH consult urology, Flomax after talking to urologist. Patient's daughter is requesting neurology consult while he is here. #3. Right lung nodule getting workup with Dr. tox E patient scheduled to have a PET scan, repeat CAT scan. #4. Hyperlipidemia continue home medications #5 diabetes mellitus diet controlled 6 hypertension overall well-controlled hold the diuretics mainly the Hyzaar that started recently, continue metoprolol, losartan, Norvasc, clonidine. 7 history of COPD: No wheezing at this time continue Spiriva. #7. insomnia continue Ambien 5 mg as needed at night.   All the records are reviewed and case discussed with Care Management/Social Workerr. Management plans discussed with the patient, family and they are in agreement.  CODE STATUS: full  TOTAL TIME TAKING CARE OF THIS PATIENT: 35 minutes.   POSSIBLE D/C IN 1-2 DAYS, DEPENDING ON CLINICAL CONDITION.   [name] M.D on 07/17/2015 at 9:31 AM  Between 7am to 6pm - Pager - [phone]  After 6pm go to [email or web address] - password [email or web address] [facility]  Office  [phone]  CC: Primary care physician; [name], MD

## 2015-07-17 NOTE — Plan of Care (Signed)
Problem: Discharge Progression Outcomes Goal: Other Discharge Outcomes/Goals Outcome: Progressing Pt readmitted for hyponatremia.  This am Na level was 126 but dropped to 122 during the day.  Ca dropped from 8.5 to 8.1. Pt receiving NS @ 50 ml/hr.  Had urology and nephrology consult today.  BUN, Creatinine are WNL.  Urologist stated that prostate looks normal.  He asked that we bladder scan pt after he voids and he's retaining between 218-260 ml after voiding.  Scanned him 3x.  No c/o pain. Started on flomax.  Also encouraged him not to drink fluids after 6 pm.

## 2015-07-17 NOTE — Progress Notes (Signed)
Subjective:  Patient is readmitted from his primary care's office due to hyponatremia. Patient's family (daughter who is a Software engineer and his wife) report that after his discharge from the hospital last time, he had significant lower extremity edema. Recently, he was placed on hydrochlorothiazide. He got 2 doses one on Tuesday and one on Wednesday. His by mouth intake was relatively poor over these 2 days. However, his wife was encouraging him to drink lots of water. Today, he does not have any specific complaints No shortness of breath Some lower extremity edema  Sodium level was 118 at the time of admission. It has improved to 126 with saline as well as discontinuing hydrochlorothiazide Urine sodium was 90 Urine osmolality is 294. Urine specific gravity is 1.008   Objective:  Vital signs in last 24 hours:  Temp:  [97.8 F (36.6 C)-98.5 F (36.9 C)] 97.9 F (36.6 C) (10/21 0602) Pulse Rate:  [66-91] 76 (10/21 1020) Resp:  [15-23] 18 (10/21 0602) BP: (146-230)/(67-115) 152/67 mmHg (10/21 1020) SpO2:  [94 %-99 %] 96 % (10/21 0602) Weight:  [61.871 kg (136 lb 6.4 oz)] 61.871 kg (136 lb 6.4 oz) (10/21 0500)  Weight change:  Filed Weights   07/16/15 1229 07/17/15 0500  Weight: 61.236 kg (135 lb) 61.871 kg (136 lb 6.4 oz)    Intake/Output:    Intake/Output Summary (Last 24 hours) at 07/17/15 1337 Last data filed at 07/17/15 1127  Gross per 24 hour  Intake      3 ml  Output   1425 ml  Net  -1422 ml     Physical Exam: General:  chronically ill-appearing   HEENT  anicteric, moist oral mucous membranes   Neck supple  Pulm/lungs  normal respiratory effort, clear to auscultation bilaterally   CVS/Heart  no rub or gallop   Abdomen:  Soft, NT  Extremities: + some dependent edema  Neurologic:  alert, oriented, speech normal, no focal deficit   Skin: No acute rashes  Access:        Basic Metabolic Panel:   Recent Labs Lab 07/12/15 1435 07/13/15 1152 07/15/15 1227  07/16/15 0809 07/16/15 1241 07/17/15 0429  NA 134* 131* 124* 122* 118* 126*  K 3.9 3.9 4.5  --  4.1 3.6  CL 93* 91* 83*  --  77* 84*  CO2 34* 33* 34*  --  28 35*  GLUCOSE 152* 131* 197*  --  159* 112*  BUN '10 11 10  '$ --  8 8  CREATININE 0.81 0.83 0.87  --  0.78 0.69  CALCIUM 9.1 9.1 9.3  --  8.5* 8.5*     CBC:  Recent Labs Lab 07/12/15 1435 07/16/15 1241  WBC 7.0 6.3  NEUTROABS 5.1  --   HGB 12.8* 13.7  HCT 40.3 41.2  MCV 95.4 93.1  PLT 280 304      Microbiology:  Recent Results (from the past 720 hour(s))  Blood culture (routine x 2)     Status: None   Collection Time: 06/25/15  5:19 PM  Result Value Ref Range Status   Specimen Description BLOOD RIGHT ARM  Final   Special Requests BAA,AER5ML,ANA5ML  Final   Culture NO GROWTH 6 DAYS  Final   Report Status 07/01/2015 FINAL  Final  Blood culture (routine x 2)     Status: None   Collection Time: 06/25/15  5:57 PM  Result Value Ref Range Status   Specimen Description BLOOD RIGHT HAND  Final   Special Requests BAA, ANA5ML,AER5ML  Final   Culture NO GROWTH 6 DAYS  Final   Report Status 07/01/2015 FINAL  Final  MRSA PCR Screening     Status: None   Collection Time: 06/26/15  9:50 AM  Result Value Ref Range Status   MRSA by PCR NEGATIVE NEGATIVE Final    Comment:        The GeneXpert MRSA Assay (FDA approved for NASAL specimens only), is one component of a comprehensive MRSA colonization surveillance program. It is not intended to diagnose MRSA infection nor to guide or monitor treatment for MRSA infections.   Culture, respiratory (NON-Expectorated)     Status: None   Collection Time: 06/26/15 12:26 PM  Result Value Ref Range Status   Specimen Description TRACHEAL ASPIRATE  Final   Special Requests Normal  Final   Gram Stain   Final    FAIR SPECIMEN - 70-80% WBCS FEW WBC SEEN MODERATE GRAM POSITIVE COCCI RARE YEAST    Culture APPEARS TO BE NORMAL FLORA  Final   Report Status 06/29/2015 FINAL  Final   C difficile quick scan w PCR reflex     Status: None   Collection Time: 07/05/15  3:00 AM  Result Value Ref Range Status   C Diff antigen NEGATIVE NEGATIVE Final   C Diff toxin NEGATIVE NEGATIVE Final   C Diff interpretation Negative for C. difficile  Final    Coagulation Studies: No results for input(s): LABPROT, INR in the last 72 hours.  Urinalysis:  Recent Labs  07/16/15 1442  COLORURINE STRAW*  LABSPEC 1.008  PHURINE 7.0  GLUCOSEU NEGATIVE  HGBUR NEGATIVE  BILIRUBINUR NEGATIVE  KETONESUR NEGATIVE  PROTEINUR 100*  NITRITE NEGATIVE  LEUKOCYTESUR NEGATIVE      Imaging: Dg Chest 2 View  07/16/2015  CLINICAL DATA:  Hyponatremia.  Right upper lobe mass.  COPD. EXAM: CHEST  2 VIEW COMPARISON:  07/02/2015 FINDINGS: Left jugular central venous catheter is been removed since previous study. Nodular density again seen in the right upper lobe, better visualized on recent chest CT of 06/25/2015. No evidence of pulmonary consolidation or edema. No evidence of pneumothorax or pleural effusion. IMPRESSION: No acute findings. Right upper lobe nodular density, better demonstrated on recent chest CT of 06/25/2015. Electronically Signed   By: Earle Gell M.D.   On: 07/16/2015 16:02     Medications:   . sodium chloride 50 mL/hr at 07/16/15 1822   . acidophilus  1 capsule Oral Daily  . [START ON 07/18/2015] amLODipine  10 mg Oral Daily  . amLODipine  5 mg Oral Once  . aspirin EC  81 mg Oral Daily  . budesonide-formoterol  2 puff Inhalation BID  . cloNIDine  0.1 mg Oral BID  . enoxaparin (LOVENOX) injection  40 mg Subcutaneous Q24H  . feeding supplement (ENSURE ENLIVE)  237 mL Oral BID BM  . ipratropium  2 spray Each Nare QID  . levothyroxine  50 mcg Oral QAC breakfast  . losartan  50 mg Oral Daily  . metoprolol tartrate  25 mg Oral BID  . pantoprazole  40 mg Oral QAC breakfast  . sodium chloride  3 mL Intravenous Q12H  . tamsulosin  0.4 mg Oral Daily  . tiotropium  18 mcg  Inhalation Daily  . traMADol  50 mg Oral 4 times per day   sodium chloride, acetaminophen **OR** acetaminophen, albuterol, hydrALAZINE, HYDROcodone-acetaminophen, ondansetron **OR** ondansetron (ZOFRAN) IV, polyethylene glycol, sodium chloride  Assessment/ Plan:  81 y.o. male with medical problems of COPD, and diet-controlled  diabetes, DJD, hypertension, polyneuropathy, dementia who was admitted to [facility] on 06/25/2015 for evaluation of altered mental status.   1. Acute hyponatremia Likely secondary to use of hydrochlorothiazide and drinking excessive water with poor by mouth intake(tea Titus Mould) diet - Sodium level is improving with IV saline administration - Monitor sodium closely. BMP ordered for this afternoon. - We'll continue to follow  2. BPH - Patient and family report significant frequency of urination especially at night - Trial of Flomax to see if symptoms improve  Discussed with patient's daughter, wife and the patient   LOS: 1 Alexandria Shiflett 10/21/20161:37 PM

## 2015-07-17 NOTE — Progress Notes (Signed)
Chart and imaging reviewed - full consult to follow.   His Bun, Cr and U/A are normal.   PVR earlier was 240 ml, pt voided about 300 ml recently.   Sept 2015 CT A/P - showed nl kidneys and normal size prostate. Bladder not distended.   Urinary frequency - Agree with trial of tamsulosin. Possible more OAB symptoms/picture but anticholinergics have a lot of side effects.

## 2015-07-17 NOTE — Plan of Care (Signed)
Problem: Discharge Progression Outcomes Goal: Other Discharge Outcomes/Goals Outcome: Progressing Plan of Care Progress to Goal:   Pt denies pain. Pt report inability to void once during shift. Bladder scan showed 162 ml. Pt has voided since bladder scan. Pt has been resting comfortably during shift. No other signs of distress noted. Will continue to monitor.

## 2015-07-18 DIAGNOSIS — R351 Nocturia: Secondary | ICD-10-CM

## 2015-07-18 DIAGNOSIS — N401 Enlarged prostate with lower urinary tract symptoms: Secondary | ICD-10-CM

## 2015-07-18 DIAGNOSIS — R3914 Feeling of incomplete bladder emptying: Secondary | ICD-10-CM

## 2015-07-18 LAB — BASIC METABOLIC PANEL
ANION GAP: 6 (ref 5–15)
BUN: 10 mg/dL (ref 6–20)
CHLORIDE: 84 mmol/L — AB (ref 101–111)
CO2: 31 mmol/L (ref 22–32)
Calcium: 7.8 mg/dL — ABNORMAL LOW (ref 8.9–10.3)
Creatinine, Ser: 0.72 mg/dL (ref 0.61–1.24)
GFR calc non Af Amer: >60 mL/min (ref >60)
Glucose, Bld: 128 mg/dL — ABNORMAL HIGH (ref 65–99)
POTASSIUM: 3.7 mmol/L (ref 3.5–5.1)
Sodium: 121 mmol/L — ABNORMAL LOW (ref 135–145)

## 2015-07-18 NOTE — Progress Notes (Signed)
Subjective:  Patient is readmitted from his primary care's office due to hyponatremia. Patient's family - daughter (who is a Software engineer) and wife report that after his discharge from the hospital last time, he had significant lower extremity edema. Recently, he was placed on hydrochlorothiazide. He got 2 doses-  one on Tuesday and one on Wednesday. His oral intake was relatively poor over these 2 days. However, his wife was encouraging him to drink lots of water. Overall feels better Ate a good breakfast Sodium level had improved to 126 with IV saline and that today level is down to 121 again Family reports that he was a little wobbly when he worked with physical therapist  Objective:  Vital signs in last 24 hours:  Temp:  [97.5 F (36.4 C)-98.1 F (36.7 C)] 97.5 F (36.4 C) (10/22 0555) Pulse Rate:  [64-88] 88 (10/22 0911) Resp:  [16-18] 18 (10/22 0555) BP: (114-131)/(49-58) 119/49 mmHg (10/22 0911) SpO2:  [92 %-96 %] 92 % (10/22 0555) Weight:  [62.097 kg (136 lb 14.4 oz)] 62.097 kg (136 lb 14.4 oz) (10/22 0636)  Weight change: 0.862 kg (1 lb 14.4 oz) Filed Weights   07/16/15 1229 07/17/15 0500 07/18/15 0636  Weight: 61.236 kg (135 lb) 61.871 kg (136 lb 6.4 oz) 62.097 kg (136 lb 14.4 oz)    Intake/Output:    Intake/Output Summary (Last 24 hours) at 07/18/15 1244 Last data filed at 07/18/15 1200  Gross per 24 hour  Intake   2053 ml  Output    300 ml  Net   1753 ml     Physical Exam: General:  chronically ill-appearing , laying in the bed   HEENT  anicteric, moist oral mucous membranes   Neck supple  Pulm/lungs  normal respiratory effort, clear to auscultation bilaterally   CVS/Heart  no rub or gallop   Abdomen:  Soft, NT  Extremities: + some dependent edema  Neurologic:  alert, oriented, speech normal, no focal deficit   Skin: No acute rashes  Access:        Basic Metabolic Panel:   Recent Labs Lab 07/15/15 1227 07/16/15 0809 07/16/15 1241 07/17/15 0429  07/17/15 1347 07/18/15 0537  NA 124* 122* 118* 126* 122* 121*  K 4.5  --  4.1 3.6 3.9 3.7  CL 83*  --  77* 84* 84* 84*  CO2 34*  --  28 35* 30 31  GLUCOSE 197*  --  159* 112* 140* 128*  BUN 10  --  '8 8 9 10  '$ CREATININE 0.87  --  0.78 0.69 0.76 0.72  CALCIUM 9.3  --  8.5* 8.5* 8.1* 7.8*     CBC:  Recent Labs Lab 07/12/15 1435 07/16/15 1241  WBC 7.0 6.3  NEUTROABS 5.1  --   HGB 12.8* 13.7  HCT 40.3 41.2  MCV 95.4 93.1  PLT 280 304      Microbiology:  Recent Results (from the past 720 hour(s))  Blood culture (routine x 2)     Status: None   Collection Time: 06/25/15  5:19 PM  Result Value Ref Range Status   Specimen Description BLOOD RIGHT ARM  Final   Special Requests BAA,AER5ML,ANA5ML  Final   Culture NO GROWTH 6 DAYS  Final   Report Status 07/01/2015 FINAL  Final  Blood culture (routine x 2)     Status: None   Collection Time: 06/25/15  5:57 PM  Result Value Ref Range Status   Specimen Description BLOOD RIGHT HAND  Final   Special  Requests BAA, ANA5ML,AER5ML  Final   Culture NO GROWTH 6 DAYS  Final   Report Status 07/01/2015 FINAL  Final  MRSA PCR Screening     Status: None   Collection Time: 06/26/15  9:50 AM  Result Value Ref Range Status   MRSA by PCR NEGATIVE NEGATIVE Final    Comment:        The GeneXpert MRSA Assay (FDA approved for NASAL specimens only), is one component of a comprehensive MRSA colonization surveillance program. It is not intended to diagnose MRSA infection nor to guide or monitor treatment for MRSA infections.   Culture, respiratory (NON-Expectorated)     Status: None   Collection Time: 06/26/15 12:26 PM  Result Value Ref Range Status   Specimen Description TRACHEAL ASPIRATE  Final   Special Requests Normal  Final   Gram Stain   Final    FAIR SPECIMEN - 70-80% WBCS FEW WBC SEEN MODERATE GRAM POSITIVE COCCI RARE YEAST    Culture APPEARS TO BE NORMAL FLORA  Final   Report Status 06/29/2015 FINAL  Final  C difficile  quick scan w PCR reflex     Status: None   Collection Time: 07/05/15  3:00 AM  Result Value Ref Range Status   C Diff antigen NEGATIVE NEGATIVE Final   C Diff toxin NEGATIVE NEGATIVE Final   C Diff interpretation Negative for C. difficile  Final    Coagulation Studies: No results for input(s): LABPROT, INR in the last 72 hours.  Urinalysis:  Recent Labs  07/16/15 1442  COLORURINE STRAW*  LABSPEC 1.008  PHURINE 7.0  GLUCOSEU NEGATIVE  HGBUR NEGATIVE  BILIRUBINUR NEGATIVE  KETONESUR NEGATIVE  PROTEINUR 100*  NITRITE NEGATIVE  LEUKOCYTESUR NEGATIVE      Imaging: Dg Chest 2 View  07/16/2015  CLINICAL DATA:  Hyponatremia.  Right upper lobe mass.  COPD. EXAM: CHEST  2 VIEW COMPARISON:  07/02/2015 FINDINGS: Left jugular central venous catheter is been removed since previous study. Nodular density again seen in the right upper lobe, better visualized on recent chest CT of 06/25/2015. No evidence of pulmonary consolidation or edema. No evidence of pneumothorax or pleural effusion. IMPRESSION: No acute findings. Right upper lobe nodular density, better demonstrated on recent chest CT of 06/25/2015. Electronically Signed   By: Earle Gell M.D.   On: 07/16/2015 16:02     Medications:     . acidophilus  1 capsule Oral Daily  . amLODipine  10 mg Oral Daily  . aspirin EC  81 mg Oral Daily  . budesonide-formoterol  2 puff Inhalation BID  . cloNIDine  0.1 mg Oral BID  . enoxaparin (LOVENOX) injection  40 mg Subcutaneous Q24H  . feeding supplement (ENSURE ENLIVE)  237 mL Oral BID BM  . ipratropium  2 spray Each Nare QID  . levothyroxine  50 mcg Oral QAC breakfast  . losartan  50 mg Oral Daily  . metoprolol tartrate  25 mg Oral BID  . pantoprazole  40 mg Oral QAC breakfast  . sodium chloride  3 mL Intravenous Q12H  . tamsulosin  0.4 mg Oral Daily  . tiotropium  18 mcg Inhalation Daily  . traMADol  50 mg Oral 4 times per day   sodium chloride, acetaminophen **OR** acetaminophen,  albuterol, hydrALAZINE, HYDROcodone-acetaminophen, ondansetron **OR** ondansetron (ZOFRAN) IV, polyethylene glycol, sodium chloride  Assessment/ Plan:  81 y.o. male with medical problems of COPD, and diet-controlled diabetes, DJD, hypertension, polyneuropathy, dementia who was admitted to [facility] on 06/25/2015 for evaluation of  altered mental status.   1. Acute hyponatremia Likely secondary to use of hydrochlorothiazide and drinking excessive water with poor by mouth intake(tea Titus Mould) diet, also possibility of underlying SIADH - Sodium level is NOT improving with IV saline administration anymore - d/c saline - Monitor sodium closely. BMP ordered for this afternoon. - May need 3% saline to improve sodium, then sustain with Lasix + salt combination - We'll continue to follow  2. BPH, with frequency of urination - appreciate urologist evaluation - Trial of Flomax to see if symptoms improve  3. Edema - d/c saline  Discussed with patient's son, wife and the patient   LOS: 2 Ziad Maye 10/22/201612:44 PM

## 2015-07-18 NOTE — Plan of Care (Addendum)
Problem: Discharge Progression Outcomes Goal: Other Discharge Outcomes/Goals Outcome: Progressing Plan of Care Progress to Goal:   Pt was lethargic at the beginning of shift but got more alert as shift progress. Pt denies pain. Pt Na is still low. Pt O2 was dropping to 87-89% multiple times during shift while pt was asleep. Place 2L nasal cannula on pt to keep O2 above 90%. No other signs of distress noted. Answered all questions asked by oncoming nurse.

## 2015-07-18 NOTE — Evaluation (Signed)
Physical Therapy Evaluation Patient Details Name: Aaron Hendrix MRN: [id number] DOB: Aug 06, 1930 Today's Date: 07/18/2015   History of Present Illness  Pt was here 3-4 weeks ago needing intubation, now here with hyponatremia and bladder issues  Clinical Impression  Pt on 2 liters O2 on arrival (99% stats) entire session on room air, sats 94% after ambulation.  Pt able to answer some questions but has some underlying cognitive issues and looked to have his family answer for most of the session.  Pt able to do most acts safely, but has very slow, labored and reported siginficant difference from baseline.     Follow Up Recommendations Home health PT    Equipment Recommendations       Recommendations for Other Services       Precautions / Restrictions Precautions Precautions: Fall Restrictions Weight Bearing Restrictions: No      Mobility  Bed Mobility Overal bed mobility: Modified Independent Bed Mobility: Supine to Sit     Supine to sit: Modified independent (Device/Increase time)     General bed mobility comments: Pt able to perform supine<>sitting transfer with assistance from bed rail and vc for sequencing of movements (i.e. reaching across with opposite hand to pull up on bed rail)   Transfers Overall transfer level: Needs assistance Equipment used: Rolling walker (2 wheeled) Transfers: Sit to/from Stand Sit to Stand: Min assist            Ambulation/Gait Ambulation/Gait assistance: Min assist Ambulation Distance (Feet): 25 Feet Assistive device: Rolling walker (2 wheeled)       General Gait Details: Pt initially with very slow, shuffling gait that family reports is much different from baseline.  With heavy and consistent cuing he is able to take a few reciprocal, more appropriate steps, but is unable to be consistent and is also fatigued with the effort.   Stairs            Wheelchair Mobility    Modified Rankin (Stroke Patients Only)        Balance                                             Pertinent Vitals/Pain Pain Assessment: No/denies pain    Home Living Family/patient expects to be discharged to:: Private residence Living Arrangements: Children;Spouse/significant other Available Help at Discharge: Family Type of Home: House Home Access: Level entry     [place]: Able to live on main level with bedroom/bathroom Home Equipment: Walker - 2 wheels;Walker - 4 wheels;Shower seat      Prior Function Level of Independence: Independent with assistive device(s)         Comments: Pt with some cognitive deficits, he does need some safety assist     Hand Dominance        Extremity/Trunk Assessment   Upper Extremity Assessment: Overall WFL for tasks assessed (though L shoulder appears weaker than R)           Lower Extremity Assessment: Generalized weakness (some issues with lack of understanding of requested acts)         Communication   Communication: HOH;Expressive difficulties  Cognition Arousal/Alertness:  (pt is awake, is not fully alert to the situation) Behavior During Therapy: WFL for tasks assessed/performed Overall Cognitive Status: History of cognitive impairments - at baseline  General Comments      Exercises        Assessment/Plan    PT Assessment Patient needs continued PT services  PT Diagnosis Difficulty walking;Generalized weakness;Altered mental status   PT Problem List Decreased strength;Decreased range of motion;Decreased activity tolerance;Decreased balance;Decreased mobility;Decreased coordination;Decreased cognition;Decreased safety awareness;Cardiopulmonary status limiting activity  PT Treatment Interventions DME instruction;Gait training;Functional mobility training;Therapeutic activities;Therapeutic exercise;Balance training   PT Goals (Current goals can be found in the Care Plan section) Acute Rehab PT  Goals Patient Stated Goal: family would like him to continue HHPT  PT Goal Formulation: With patient/family Time For Goal Achievement: 08/01/15 Potential to Achieve Goals: Fair    Frequency Min 2X/week   Barriers to discharge        Co-evaluation               End of Session Equipment Utilized During Treatment: Gait belt Activity Tolerance: Patient tolerated treatment well Patient left: with chair alarm set;with family/visitor present           Time: 7793-9030 PT Time Calculation (min) (ACUTE ONLY): 18 min   Charges:   PT Evaluation $Initial PT Evaluation Tier I: 1 Procedure     PT G Codes:       Wayne Both, PT, DPT [phone]  Kreg Shropshire 07/18/2015, 11:58 AM

## 2015-07-18 NOTE — Plan of Care (Signed)
Problem: Discharge Progression Outcomes Goal: Other Discharge Outcomes/Goals Outcome: Progressing 1. Desires to return home; may need rehab when discharged from hospital.  2. VSS. Sodium 121 this morning remains low. IVF's of NS discontinued.  3.  Continued low sodium with corrective measures being done. More alert with mild confusion noted. Voided in urinal with immediate post void bladder scan with 108-115 ml result. Pt reports he feels he is emptying bladder. 4. Tolerating diet well; feeds self after set up. 5. Rested quietly in bed. Wife at bedside and active in pt care.

## 2015-07-18 NOTE — Consult Note (Signed)
H&P  Chief Complaint: nocturia  History of Present Illness: 81 yo admitted with hyponatremia with nocturia for past few months. No LE edema or snoring. Pt interviewed with son and wife present and daughter on speaker phone. Pt voids with an intermittent stream and some occasional frequency. No dysuria or gross hematuria. No h/o of GU surgery or radiation. His PVR's have run around 200 and he was started on tamsulosin.   His Bun, Cr and U/A are normal.   PVR earlier was 240 ml, pt voided about 300 ml per nurse.   Sept 2015 CT A/P - showed nl kidneys and BPH. About a 50 gram prostate and a median lobe present on my calculation and image review. Bladder not distended.   Past Medical History  Diagnosis Date  . COPD with asthma ([place])   . Diabetes mellitus     Diet control   . Hyperlipidemia   . Carpal tunnel syndrome   . DJD (degenerative joint disease), cervical   . Vitamin D deficiency   . Tremor, essential   . Internal hemorrhoids   . Personal history of colonic polyps     adenomatous  . IBS (irritable bowel syndrome)   . Peptic ulcer   . Duodenal ulcer, with partial obstruction 08/10/2012  . Numbness and tingling in right hand     started 2 yeas ago  . Hypertension     no medicine needed  . Memory deficit 12/16/2013  . Essential and other specified forms of tremor 12/16/2013  . Polyneuropathy in diabetes(357.2)    Past Surgical History  Procedure Laterality Date  . Anal fissure repair    . Colonoscopy w/ biopsies and polypectomy  8/03, 6/05, 7/09, 9/10    internal hemorrhoids, tubular adenomas, mucosa & lymphoid nodules  . Upper gastrointestinal endoscopy  3/05, 7/09, 9/10,2013    gastritis, duodenitis  . Total hip arthroplasty Right 11/13/2012    Procedure: TOTAL HIP ARTHROPLASTY ANTERIOR APPROACH;  Surgeon: Mcarthur Rossetti, MD;  Location: [facility];  Service: Orthopedics;  Laterality: Right;  Right total hip arthroplasty  . Ankle surgery Right     right- pins placed in     Home Medications:  Prescriptions prior to admission  Medication Sig Dispense Refill Last Dose  . acetaminophen (TYLENOL) 500 MG tablet Take 500 mg by mouth every 6 (six) hours as needed.   prn at prn  . albuterol (PROVENTIL) (2.5 MG/3ML) 0.083% nebulizer solution Take 3 mLs (2.5 mg total) by nebulization every 6 (six) hours as needed for wheezing or shortness of breath. 75 mL 12 prn at prn  . budesonide-formoterol (SYMBICORT) 160-4.5 MCG/ACT inhaler Inhale 2 puffs into the lungs 2 (two) times daily.   07/15/2015 at Unknown time  . cloNIDine (CATAPRES) 0.1 MG tablet Take 1 tablet (0.1 mg total) by mouth 2 (two) times daily. 60 tablet 11 07/15/2015 at Unknown time  . Docusate Calcium (STOOL SOFTENER PO) Take by mouth.   prn at prn  . feeding supplement, ENSURE ENLIVE, (ENSURE ENLIVE) LIQD Take 237 mLs by mouth 2 (two) times daily between meals. 237 mL 12 07/15/2015 at Unknown time  . Fluticasone Furoate-Vilanterol 100-25 MCG/INH AEPB Inhale 1 puff into the lungs daily. 14 each 0 07/16/2015 at Unknown time  . ipratropium (ATROVENT) 0.06 % nasal spray Place 2 sprays into the nose daily as needed.    prn at prn  . levothyroxine (SYNTHROID, LEVOTHROID) 50 MCG tablet Take 50 mcg by mouth daily before breakfast.   07/15/2015 at Unknown  time  . losartan-hydrochlorothiazide (HYZAAR) 50-12.5 MG tablet Take 1 tablet by mouth daily. 30 tablet 11 07/15/2015 at Unknown time  . metoprolol tartrate (LOPRESSOR) 25 MG tablet Take 1 tablet (25 mg total) by mouth 2 (two) times daily. 60 tablet 0 07/16/2015 at Unknown time  . pantoprazole (PROTONIX) 40 MG tablet Take 40 mg by mouth daily.   07/15/2015 at Unknown time  . saccharomyces boulardii (FLORASTOR) 250 MG capsule Take 250 mg by mouth daily.   07/15/2015 at Unknown time  . tiotropium (SPIRIVA) 18 MCG inhalation capsule Place 1 capsule (18 mcg total) into inhaler and inhale daily. 30 capsule 12 07/16/2015 at Unknown time  . traMADol (ULTRAM) 50 MG tablet Take  by mouth at bedtime as needed.   prn at prn  . zolpidem (AMBIEN) 5 MG tablet Take 5 mg by mouth at bedtime as needed for sleep.   prn at prn  . amLODipine (NORVASC) 5 MG tablet Take 1 tablet (5 mg total) by mouth daily. (Patient not taking: Reported on 07/15/2015) 30 tablet 0 Not Taking at Unknown time   Allergies:  Allergies  Allergen Reactions  . Fexofenadine Other (See Comments)  . Quinapril Hcl Other (See Comments)    Unknown   . Latex Rash    Family History  Problem Relation Age of Onset  . Colon cancer Neg Hx   . Esophageal cancer Neg Hx   . Rectal cancer Neg Hx   . Stomach cancer Neg Hx   . Leukemia Maternal Aunt   . Thyroid cancer Daughter 79  . Diabetes Son    Social History:  reports that he quit smoking about 52 years ago. His smoking use included Cigarettes. He has never used smokeless tobacco. He reports that he drinks alcohol. He reports that he does not use illicit drugs.  ROS: A complete review of systems was performed.  All systems are negative except for pertinent findings as noted. ROS   Physical Exam:  Vital signs in last 24 hours: Temp:  [97.5 F (36.4 C)-98.1 F (36.7 C)] 97.5 F (36.4 C) (10/22 0555) Pulse Rate:  [64-88] 88 (10/22 0911) Resp:  [16-18] 18 (10/22 0555) BP: (114-152)/(49-67) 119/49 mmHg (10/22 0911) SpO2:  [92 %-96 %] 92 % (10/22 0555) Weight:  [62.097 kg (136 lb 14.4 oz)] 62.097 kg (136 lb 14.4 oz) (10/22 0636) General:  Alert and oriented, No acute distress HEENT: Normocephalic, atraumatic Neck: No JVD or lymphadenopathy Cardiovascular: Regular rate and rhythm Lungs: Regular rate and effort Abdomen: Soft, nontender, nondistended, no abdominal masses Back: No CVA tenderness Extremities: No edema Neurologic: Grossly intact  Laboratory Data:  Results for orders placed or performed during the hospital encounter of 07/16/15 (from the past 24 hour(s))  Basic metabolic panel     Status: Abnormal   Collection Time: 07/17/15  1:47 PM   Result Value Ref Range   Sodium 122 (L) 135 - 145 mmol/L   Potassium 3.9 3.5 - 5.1 mmol/L   Chloride 84 (L) 101 - 111 mmol/L   CO2 30 22 - 32 mmol/L   Glucose, Bld 140 (H) 65 - 99 mg/dL   BUN 9 6 - 20 mg/dL   Creatinine, Ser 0.76 0.61 - 1.24 mg/dL   Calcium 8.1 (L) 8.9 - 10.3 mg/dL   GFR calc non Af Amer >60 >60 mL/min   GFR calc Af Amer >60 >60 mL/min   Anion gap 8 5 - 15  Basic metabolic panel     Status: Abnormal  Collection Time: 07/18/15  5:37 AM  Result Value Ref Range   Sodium 121 (L) 135 - 145 mmol/L   Potassium 3.7 3.5 - 5.1 mmol/L   Chloride 84 (L) 101 - 111 mmol/L   CO2 31 22 - 32 mmol/L   Glucose, Bld 128 (H) 65 - 99 mg/dL   BUN 10 6 - 20 mg/dL   Creatinine, Ser 0.72 0.61 - 1.24 mg/dL   Calcium 7.8 (L) 8.9 - 10.3 mg/dL   GFR calc non Af Amer >60 >60 mL/min   GFR calc Af Amer >60 >60 mL/min   Anion gap 6 5 - 15   No results found for this or any previous visit (from the past 240 hour(s)). Creatinine:  Recent Labs  07/12/15 1435 07/13/15 1152 07/15/15 1227 07/16/15 1241 07/17/15 0429 07/17/15 1347 07/18/15 0537  CREATININE 0.81 0.83 0.87 0.78 0.69 0.76 0.72    Impression/Assessment/plan:  BPH with nocturia, occasional frequency and intermittent stream - agree with tamsulosin. Discussed nature r/b/a to alpha blockers including OAB meds and desmopressin. F/u in office for repeat PVR and DRE in a few weeks. F/u on chart.   Incomplete bladder emptying - his PVR is not excessive and should not affect kidney function. Will recheck as above.   Kaya Klausing 07/18/2015, 9:56 AM

## 2015-07-18 NOTE — Progress Notes (Signed)
[facility] [place] NAME: Aaron Hendrix    MR#:  [id number]  DATE OF BIRTH:  31-Oct-1929    CHIEF COMPLAINT:   Chief Complaint  Patient presents with  . abnormal labs     Eating better. Sitting up in a chair. Wife and son at bedside. Worked with physical therapy today and use a walker. Patient has no concerns other than generalized weakness  REVIEW OF SYSTEMS:   ROS CONSTITUTIONAL: No fever, fatigue or weakness.  EYES: No blurred or double vision.  EARS, NOSE, AND THROAT: No tinnitus or ear pain.  RESPIRATORY: No cough, shortness of breath, wheezing or hemoptysis.  CARDIOVASCULAR: No chest pain, orthopnea, edema.  GASTROINTESTINAL: No nausea, vomiting, diarrhea or abdominal pain.  GENITOURINARY: No dysuria, hematuria.  ENDOCRINE: No polyuria, nocturia,  HEMATOLOGY: No anemia, easy bruising or bleeding SKIN: No rash or lesion. MUSCULOSKELETAL: No joint pain or arthritis.  Generalized weakness NEUROLOGIC: No tingling, numbness, weakness.  PSYCHIATRY: No anxiety or depression.   DRUG ALLERGIES:   Allergies  Allergen Reactions  . Fexofenadine Other (See Comments)  . Quinapril Hcl Other (See Comments)    Unknown   . Latex Rash    VITALS:  Blood pressure 119/49, pulse 88, temperature 97.5 F (36.4 C), temperature source Oral, resp. rate 18, height '5\' 4"'$  (1.626 m), weight 62.097 kg (136 lb 14.4 oz), SpO2 92 %.  PHYSICAL EXAMINATION:  GENERAL:  81 y.o.-year-old patient lying in the bed with no acute distress.  EYES: Pupils equal, round, reactive to light and accommodation. No scleral icterus. Extraocular muscles intact.  HEENT: Head atraumatic, normocephalic. Oropharynx and nasopharynx clear.  NECK:  Supple, no jugular venous distention. No thyroid enlargement, no tenderness.  LUNGS: Normal breath sounds bilaterally, no wheezing, rales,rhonchi or crepitation. No use of accessory muscles of respiration.   CARDIOVASCULAR: S1, S2 normal. No murmurs, rubs, or gallops.  ABDOMEN: Soft, nontender, nondistended. Bowel sounds present. No organomegaly or mass.  EXTREMITIES: No pedal edema, cyanosis, or clubbing.  NEUROLOGIC: Cranial nerves II through XII are intact. Muscle strength 5/5 in all extremities. Sensation intact. Gait not checked.  PSYCHIATRIC: The patient is alert and oriented x 3.  SKIN: No obvious rash, lesion, or ulcer.    LABORATORY PANEL:   CBC  Recent Labs Lab 07/16/15 1241  WBC 6.3  HGB 13.7  HCT 41.2  PLT 304   ------------------------------------------------------------------------------------------------------------------  Chemistries   Recent Labs Lab 07/18/15 0537  NA 121*  K 3.7  CL 84*  CO2 31  GLUCOSE 128*  BUN 10  CREATININE 0.72  CALCIUM 7.8*   ------------------------------------------------------------------------------------------------------------------  Cardiac Enzymes No results for input(s): TROPONINI in the last 168 hours. ------------------------------------------------------------------------------------------------------------------  RADIOLOGY:  Dg Chest 2 View  07/16/2015  CLINICAL DATA:  Hyponatremia.  Right upper lobe mass.  COPD. EXAM: CHEST  2 VIEW COMPARISON:  07/02/2015 FINDINGS: Left jugular central venous catheter is been removed since previous study. Nodular density again seen in the right upper lobe, better visualized on recent chest CT of 06/25/2015. No evidence of pulmonary consolidation or edema. No evidence of pneumothorax or pleural effusion. IMPRESSION: No acute findings. Right upper lobe nodular density, better demonstrated on recent chest CT of 06/25/2015. Electronically Signed   By: Earle Gell M.D.   On: 07/16/2015 16:02    EKG:   Orders placed or performed during the hospital encounter of 07/16/15  . ED EKG  . 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 Conjugated  by hydrochlorothiazide. Sodium is slowly trending down again. We will stop IV fluids. Patient is eating better and sodium should slowly improve. Repeat sodium level in the morning. Discussed with Dr. Candiss Norse of nephrology. May try Lasix if sodium is worsening. Sodium chloride supplementation if no response.  * BPH Appreciate urology input Flomax started. Outpatient neurology follow-up.  * Right lung nodule getting workup with Dr. Oliva Bustard patient scheduled to have a PET scan, repeat CAT scan. As outpatient  * diabetes mellitus diet controlled  * hypertension  Hydrochlorothiazide stopped due to hyponatremia  metoprolol, losartan, Norvasc, clonidine.  * history of COPD: No wheezing at this time continue Spiriva.  * Insomnia continue Ambien 5 mg as needed at night.  Plan was discussed in detail with family in the room with her daughter over speaker phone. All questions answered.  CODE STATUS: full  TOTAL TIME TAKING CARE OF THIS PATIENT: 40 minutes.  Greater than 50% time spent in discussing with patient and family at bedside.  POSSIBLE D/C IN 1-2 DAYS, DEPENDING ON CLINICAL CONDITION.   Hillary Bow R M.D on 07/18/2015 at 11:39 AM  Between 7am to 6pm - Pager - [phone]  After 6pm go to [email or web address] - password [email or web address] [facility]  Office  [phone]  CC: Primary care physician; [name], MD

## 2015-07-19 DIAGNOSIS — E871 Hypo-osmolality and hyponatremia: Secondary | ICD-10-CM

## 2015-07-19 DIAGNOSIS — J9612 Chronic respiratory failure with hypercapnia: Secondary | ICD-10-CM

## 2015-07-19 DIAGNOSIS — J984 Other disorders of lung: Secondary | ICD-10-CM

## 2015-07-19 DIAGNOSIS — R0902 Hypoxemia: Secondary | ICD-10-CM

## 2015-07-19 LAB — MRSA PCR SCREENING: MRSA by PCR: NEGATIVE

## 2015-07-19 LAB — OSMOLALITY, URINE: Osmolality, Ur: 465 mOsm/kg (ref 300–900)

## 2015-07-19 LAB — MAGNESIUM: Magnesium: 1.4 mg/dL — ABNORMAL LOW (ref 1.7–2.4)

## 2015-07-19 LAB — CHLORIDE, URINE, RANDOM: CHLORIDE URINE: 45 mmol/L

## 2015-07-19 LAB — SODIUM
SODIUM: 121 mmol/L — AB (ref 135–145)
SODIUM: 125 mmol/L — AB (ref 135–145)
Sodium: 122 mmol/L — ABNORMAL LOW (ref 135–145)

## 2015-07-19 MED ORDER — MAGNESIUM SULFATE 4 GM/100ML IV SOLN
4.0000 g | Freq: Once | INTRAVENOUS | Status: AC
  Administered 2015-07-19: 4 g via INTRAVENOUS
  Filled 2015-07-19: qty 100

## 2015-07-19 MED ORDER — TRAMADOL HCL 50 MG PO TABS: 50.0000 mg | ORAL_TABLET | Freq: Four times a day (QID) | ORAL | Status: DC | PRN

## 2015-07-19 MED ORDER — MAGNESIUM OXIDE 400 (241.3 MG) MG PO TABS
400.0000 mg | ORAL_TABLET | Freq: Every day | ORAL | Status: AC
  Administered 2015-07-20 – 2015-07-21 (×2): 400 mg via ORAL
  Filled 2015-07-19 (×2): qty 1

## 2015-07-19 MED ORDER — FUROSEMIDE 20 MG PO TABS
20.0000 mg | ORAL_TABLET | Freq: Every day | ORAL | Status: DC
Start: 2015-07-19 — End: 2015-07-22
  Administered 2015-07-19 – 2015-07-22 (×4): 20 mg via ORAL
  Filled 2015-07-19 (×4): qty 1

## 2015-07-19 MED ORDER — SODIUM CHLORIDE 3 % IV SOLN
INTRAVENOUS | Status: AC
  Administered 2015-07-19: 30 mL/h via INTRAVENOUS
  Filled 2015-07-19: qty 500

## 2015-07-19 NOTE — Progress Notes (Signed)
Pt arrived from the floor from [facility]. Per pt's floor RN pt had not voided the entire shift. This RN bladder scanned pt and pt had 345m's in bladder. Mikea Quadros E 4:15 PM 07/19/2015

## 2015-07-19 NOTE — Plan of Care (Signed)
Called Dr. Darvin Neighbours b/c pt's son was concerned that hypotonic solution was making him lethargic b/c that is what happened last admission.  Dr. Chauncey Cruel stated that pt was lethargic before 3% was started.  Pt tsferred to ICU b/c of CO2 rentention and will be put on bipap.

## 2015-07-19 NOTE — Progress Notes (Signed)
[facility] [place] NAME: Aaron Hendrix    MR#:  [id number]  DATE OF BIRTH:  25-Sep-1930    CHIEF COMPLAINT:   Chief Complaint  Patient presents with  . abnormal labs     Eating breakfast in his bed. Wife and son at bedside. Patient has no concerns other than generalized weakness  REVIEW OF SYSTEMS:   ROS CONSTITUTIONAL: No fever, fatigue or weakness.  EYES: No blurred or double vision.  EARS, NOSE, AND THROAT: No tinnitus or ear pain.  RESPIRATORY: No cough, shortness of breath, wheezing or hemoptysis.  CARDIOVASCULAR: No chest pain, orthopnea, edema.  GASTROINTESTINAL: No nausea, vomiting, diarrhea or abdominal pain.  GENITOURINARY: No dysuria, hematuria.  ENDOCRINE: No polyuria, nocturia,  HEMATOLOGY: No anemia, easy bruising or bleeding SKIN: No rash or lesion. MUSCULOSKELETAL: No joint pain or arthritis.  Generalized weakness NEUROLOGIC: No tingling, numbness, weakness.  PSYCHIATRY: No anxiety or depression.   DRUG ALLERGIES:   Allergies  Allergen Reactions  . Fexofenadine Other (See Comments)  . Quinapril Hcl Other (See Comments)    Unknown   . Latex Rash    VITALS:  Blood pressure 129/58, pulse 67, temperature 98.4 F (36.9 C), temperature source Oral, resp. rate 20, height '5\' 4"'$  (1.626 m), weight 65.59 kg (144 lb 9.6 oz), SpO2 95 %.  PHYSICAL EXAMINATION:  GENERAL:  81 y.o.-year-old patient lying in the bed with no acute distress.  EYES: Pupils equal, round, reactive to light and accommodation. No scleral icterus. Extraocular muscles intact.  HEENT: Head atraumatic, normocephalic. Oropharynx and nasopharynx clear.  NECK:  Supple, no jugular venous distention. No thyroid enlargement, no tenderness.  LUNGS: Normal breath sounds bilaterally, no wheezing, rales,rhonchi or crepitation. No use of accessory muscles of respiration.  CARDIOVASCULAR: S1, S2 normal. No murmurs, rubs, or gallops.  ABDOMEN: Soft,  nontender, nondistended. Bowel sounds present. No organomegaly or mass.  EXTREMITIES: No pedal edema, cyanosis, or clubbing.  NEUROLOGIC: Cranial nerves II through XII are intact. Muscle strength 5/5 in all extremities. Sensation intact. Gait not checked.  PSYCHIATRIC: The patient is alert and oriented x 3.  SKIN: No obvious rash, lesion, or ulcer.    LABORATORY PANEL:   CBC  Recent Labs Lab 07/16/15 1241  WBC 6.3  HGB 13.7  HCT 41.2  PLT 304   ------------------------------------------------------------------------------------------------------------------  Chemistries   Recent Labs Lab 07/18/15 0537 07/19/15 0400  NA 121* 121*  K 3.7  --   CL 84*  --   CO2 31  --   GLUCOSE 128*  --   BUN 10  --   CREATININE 0.72  --   CALCIUM 7.8*  --    ------------------------------------------------------------------------------------------------------------------  Cardiac Enzymes No results for input(s): TROPONINI in the last 168 hours. ------------------------------------------------------------------------------------------------------------------  RADIOLOGY:  No results found.  EKG:   Orders placed or performed during the hospital encounter of 07/16/15  . ED EKG  . 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 Contributed by hydrochlorothiazide. Sodium is stable at 121 today. Patient is eating better and sodium should slowly improve. Discussed with Dr. Candiss Norse of nephrology. Start 3% saline at 30 ML per hour. Will give total of 500 ML. Check sodium every 6 hours. Start Lasix 20 mg oral daily. Will likely need sodium chloride tablets at discharge. Patient may end up having chronic hyponatremia due to his lung condition.  * BPH Appreciate urology input Flomax started.  Outpatient urology follow-up.  * Right lung nodule getting workup with Dr. Oliva Bustard patient scheduled to have a PET scan, repeat CAT scan. As  outpatient Family requested patient see Dr. Oliva Bustard tomorrow. I have placed a consult for tomorrow. He had an appointment in his office for 07/20/2015 at 1:15 PM.  * diabetes mellitus diet controlled  * hypertension  Hydrochlorothiazide stopped due to hyponatremia  metoprolol, losartan, Norvasc, clonidine. Stop Norvasc due to low normal blood pressure.  * history of COPD: No wheezing at this time continue Spiriva.  * Insomnia continue Ambien 5 mg as needed at night.  Plan was discussed in detail with family in the room with her daughter over speaker phone. All questions answered.  CODE STATUS: full  TOTAL TIME TAKING CARE OF THIS PATIENT: 35 minutes.  Greater than 50% time spent in discussing with patient and family at bedside.  POSSIBLE D/C IN 1-2 DAYS, DEPENDING ON CLINICAL CONDITION.   Hillary Bow R M.D on 07/19/2015 at 10:01 AM  Between 7am to 6pm - Pager - [phone]  After 6pm go to [email or web address] - password [email or web address] [facility]  Office  [phone]  CC: Primary care physician; [name], MD

## 2015-07-19 NOTE — Progress Notes (Signed)
Per pt's wife. She would rather wait till after patient has been seen by Dr. Oliva Bustard tomorrow to make the decision to transfer to [facility] or not. Vitor Overbaugh E 7:23 PM 07/19/2015

## 2015-07-19 NOTE — Discharge Summary (Addendum)
[facility] NAME: Aaron Hendrix    MR#:  [id number]  DATE OF BIRTH:  1930-08-27  DATE OF ADMISSION:  07/16/2015 ADMITTING PHYSICIAN: Hillary Bow, MD  DATE OF DISCHARGE: 07/22/2015  2:44 PM  PRIMARY CARE PHYSICIAN: [name], MD    ADMISSION DIAGNOSIS:  Urinary frequency [R35.0] Somnolence [R40.0] Hyponatremia [E87.1] Lung mass [R91.8] Hyperglycemia [R73.9]  DISCHARGE DIAGNOSIS:  Active Problems:   Hyponatremia  SECONDARY DIAGNOSIS:   Past Medical History  Diagnosis Date  . COPD with asthma ([place])   . Diabetes mellitus     Diet control   . Hyperlipidemia   . Carpal tunnel syndrome   . DJD (degenerative joint disease), cervical   . Vitamin D deficiency   . Tremor, essential   . Internal hemorrhoids   . Personal history of colonic polyps     adenomatous  . IBS (irritable bowel syndrome)   . Peptic ulcer   . Duodenal ulcer, with partial obstruction 08/10/2012  . Numbness and tingling in right hand     started 2 yeas ago  . Hypertension     no medicine needed  . Memory deficit 12/16/2013  . Essential and other specified forms of tremor 12/16/2013  . Polyneuropathy in diabetes(357.2)     ADMITTING HISTORY  Aaron Hendrix is a 81 y.o. male with a known history of COPD, right upper lobe lung mass, hypertension, diabetes presents to the emergency room sent in by his primary care physician due to hyponatremia. Patient was recently admitted to the hospital for similar complaints. His sodium had corrected to 131. At this time patient's main complaint is just generalized weakness. No confusion or seizures. Normal urination. He was strongly advised to go to SNF/STR but he and family adamantly refuses and just request HHPT which has been resumed on D/C  Patient is off his diuretics and is on salt tablets with lasix and has been maintaining his Na levels.  Patient saw pulmonology Dr. Rosita Fire as outpatient.  Also Dr. Oliva Bustard of oncology. PET scan was performed on 10/26 which didn't show any abnormal uptake.  He was D/C in stable condition. HOSPITAL COURSE:   * Acute encephalopathy due to CO2 narcosis Sleep apnea likely NO wheezing. Good air entry on exam.  * Hyponatremia Likely from SIADH and low solute load from poor PO intake. Contributed by hydrochlorothiazide. Sodium is stable at 131 today. Patient may end up having chronic hyponatremia due to his lung condition.  * BPH Appreciate urology input. Flomax started. Outpatient urology follow-up.  * Right lung nodule getting workup with Dr. Oliva Bustard. PET scan neg.  * Diabetes mellitus Diet controlled  * Hypertension  Hydrochlorothiazide stopped due to hyponatremia metoprolol, losartan, Norvasc, clonidine. Stopped Norvasc due to low normal blood pressure.  * History of COPD: No wheezing at this time continue Spiriva.  * Insomnia continue Ambien 5 mg as needed at night.  CONSULTS OBTAINED:  Treatment Team:  Murlean Iba, MD Festus Aloe, MD Forest Gleason, MD  DRUG ALLERGIES:   Allergies  Allergen Reactions  . Fexofenadine Other (See Comments)  . Quinapril Hcl Other (See Comments)    Unknown   . Latex Rash    DISCHARGE MEDICATIONS:   Discharge Medication List as of 07/22/2015 10:37 AM    START taking these medications   Details  furosemide (LASIX) 20 MG tablet Take 1 tablet (20 mg total) by mouth daily., Starting 07/22/2015, Until Discontinued, Normal    losartan (COZAAR) 50  MG tablet Take 1 tablet (50 mg total) by mouth daily., Starting 07/22/2015, Until Discontinued, Normal    sodium chloride 1 G tablet Take 1 tablet (1 g total) by mouth 2 (two) times daily with a meal., Starting 07/22/2015, Until Discontinued, Normal      CONTINUE these medications which have NOT CHANGED   Details  acetaminophen (TYLENOL) 500 MG tablet Take 500 mg by mouth every 6 (six) hours as needed., Until Discontinued, Historical Med     albuterol (PROVENTIL) (2.5 MG/3ML) 0.083% nebulizer solution Take 3 mLs (2.5 mg total) by nebulization every 6 (six) hours as needed for wheezing or shortness of breath., Starting 07/13/2015, Until Discontinued, Normal    budesonide-formoterol (SYMBICORT) 160-4.5 MCG/ACT inhaler Inhale 2 puffs into the lungs 2 (two) times daily., Until Discontinued, Historical Med    cloNIDine (CATAPRES) 0.1 MG tablet Take 1 tablet (0.1 mg total) by mouth 2 (two) times daily., Starting 07/06/2015, Until Discontinued, Normal    Docusate Calcium (STOOL SOFTENER PO) Take by mouth., Until Discontinued, Historical Med    feeding supplement, ENSURE ENLIVE, (ENSURE ENLIVE) LIQD Take 237 mLs by mouth 2 (two) times daily between meals., Starting 07/06/2015, Until Discontinued, Normal    Fluticasone Furoate-Vilanterol 100-25 MCG/INH AEPB Inhale 1 puff into the lungs daily., Starting 07/13/2015, Until Discontinued, Sample    ipratropium (ATROVENT) 0.06 % nasal spray Place 2 sprays into the nose daily as needed. , Until Discontinued, Historical Med    levothyroxine (SYNTHROID, LEVOTHROID) 50 MCG tablet Take 50 mcg by mouth daily before breakfast., Until Discontinued, Historical Med    metoprolol tartrate (LOPRESSOR) 25 MG tablet Take 1 tablet (25 mg total) by mouth 2 (two) times daily., Starting 07/06/2015, Until Discontinued, Normal    pantoprazole (PROTONIX) 40 MG tablet Take 40 mg by mouth daily., Until Discontinued, Historical Med    saccharomyces boulardii (FLORASTOR) 250 MG capsule Take 250 mg by mouth daily., Until Discontinued, Historical Med    tiotropium (SPIRIVA) 18 MCG inhalation capsule Place 1 capsule (18 mcg total) into inhaler and inhale daily., Starting 07/06/2015, Until Discontinued, Normal    traMADol (ULTRAM) 50 MG tablet Take by mouth at bedtime as needed., Until Discontinued, Historical Med    zolpidem (AMBIEN) 5 MG tablet Take 5 mg by mouth at bedtime as needed for sleep., Until  Discontinued, Historical Med    amLODipine (NORVASC) 5 MG tablet Take 1 tablet (5 mg total) by mouth daily., Starting 07/06/2015, Until Discontinued, Normal      STOP taking these medications     losartan-hydrochlorothiazide (HYZAAR) 50-12.5 MG tablet          Today    VITAL SIGNS:  Blood pressure 129/51, pulse 69, temperature 97.7 F (36.5 C), temperature source Oral, resp. rate 18, height '5\' 4"'$  (1.626 m), weight 62.097 kg (136 lb 14.4 oz), SpO2 98 %.  PHYSICAL EXAMINATION:  Physical Exam GENERAL: 81 y.o.-year-old patient lying in the bed with no acute distress.  EYES: Pupils equal, round, reactive to light and accommodation. No scleral icterus. Extraocular muscles intact.  HEENT: Head atraumatic, normocephalic. Oropharynx and nasopharynx clear.  NECK: Supple, no jugular venous distention. No thyroid enlargement, no tenderness.  LUNGS: Normal breath sounds bilaterally, no wheezing, rales,rhonchi or crepitation. No use of accessory muscles of respiration.  CARDIOVASCULAR: S1, S2 normal. No murmurs, rubs, or gallops.  ABDOMEN: Soft, nontender, nondistended. Bowel sounds present. No organomegaly or mass.  EXTREMITIES: No pedal edema, cyanosis, or clubbing.  NEUROLOGIC: Cranial nerves II through XII are intact. Muscle strength  5/5 in all extremities. Sensation intact. Gait not checked.  PSYCHIATRIC: The patient is alert and oriented x 3.  SKIN: No obvious rash, lesion, or ulcer.  DATA REVIEW:   CBC  Recent Labs Lab 07/22/15 0458  WBC 5.7  HGB 11.0*  HCT 34.2*  PLT 271    Chemistries   Recent Labs Lab 07/19/15 1232  07/22/15 0458  NA 122*  < > 131*  K  --   --  4.4  CL  --   --  91*  CO2  --   --  37*  GLUCOSE  --   --  107*  BUN  --   --  17  CREATININE  --   < > 0.71  CALCIUM  --   --  8.5*  MG 1.4*  --   --   AST  --   --  15  ALT  --   --  8*  ALKPHOS  --   --  61  BILITOT  --   --  0.2*  < > = values in this interval not  displayed.   Management plans discussed with the patient, family and they are in agreement.  CODE STATUS: Full Code  TOTAL CC TIME TAKING CARE OF THIS PATIENT : more than 50 minutes.    [facility], Uva Runkel M.D on 07/22/2015 at 4:33 PM  Between 7am to 6pm - Pager - [phone]  After 6pm go to [email or web address] - password [email or web address] [facility]  Office  [phone]  CC: Primary care physician; [name], MD     Note: This dictation was prepared with Dragon dictation along with smaller phrase technology. Any transcriptional errors that result from this process are unintentional.

## 2015-07-19 NOTE — Consult Note (Signed)
PULMONARY / CRITICAL CARE MEDICINE   Name: Aaron Hendrix MRN: [id number] DOB: Oct 02, 1929    ADMISSION DATE:  07/16/2015 CONSULTATION DATE:  07/19/15  REFERRING MD :  Dr. Darvin Neighbours   CHIEF COMPLAINT:    hyponatremia   HISTORY OF PRESENT ILLNESS  History per chart review and speaking with wife and daughter 81 y.o. male with a known history of COPD, right upper lobe lung mass, hypertension, diabetes presents to the emergency room sent in by his primary care physician due to hyponatremia. Patient was recently admitted to the hospital for similar complaints. His sodium had corrected to 140. At this time patient's main complaint is just generalized weakness. No confusion or seizures. Normal urination. Patient is off his diuretics. His admitting sodium was 118, he was subsequently slowly placed on 3% normal saline, this morning his sodium was noted to be 121, however shortly after lunch time he started to become more lethargic and ABG was checked and showed hypercarbia, he was placed on BiPAP. After an hour BiPAP patient became unresponsive alert and oriented 3, and is now appropriate in the ICU.    SIGNIFICANT EVENTS   9/29>>unresponsive>>intubated>>hyponatremia, hypercarbia, RUL lesion 10/5>>extubated 10/10>>discharged 10/20>>readmitted for hyponatremia 10/23>>lethargic, with hypercarbia, transferred to ICU, placed on bipap    PAST MEDICAL HISTORY    :  Past Medical History  Diagnosis Date  . COPD with asthma ([place])   . Diabetes mellitus     Diet control   . Hyperlipidemia   . Carpal tunnel syndrome   . DJD (degenerative joint disease), cervical   . Vitamin D deficiency   . Tremor, essential   . Internal hemorrhoids   . Personal history of colonic polyps     adenomatous  . IBS (irritable bowel syndrome)   . Peptic ulcer   . Duodenal ulcer, with partial obstruction 08/10/2012  . Numbness and tingling in right hand     started 2 yeas ago  . Hypertension     no  medicine needed  . Memory deficit 12/16/2013  . Essential and other specified forms of tremor 12/16/2013  . Polyneuropathy in diabetes(357.2)    Past Surgical History  Procedure Laterality Date  . Anal fissure repair    . Colonoscopy w/ biopsies and polypectomy  8/03, 6/05, 7/09, 9/10    internal hemorrhoids, tubular adenomas, mucosa & lymphoid nodules  . Upper gastrointestinal endoscopy  3/05, 7/09, 9/10,2013    gastritis, duodenitis  . Total hip arthroplasty Right 11/13/2012    Procedure: TOTAL HIP ARTHROPLASTY ANTERIOR APPROACH;  Surgeon: Mcarthur Rossetti, MD;  Location: [facility];  Service: Orthopedics;  Laterality: Right;  Right total hip arthroplasty  . Ankle surgery Right     right- pins placed in   Prior to Admission medications   Medication Sig Start Date End Date Taking? Authorizing Provider  acetaminophen (TYLENOL) 500 MG tablet Take 500 mg by mouth every 6 (six) hours as needed.   Yes Historical Provider, MD  albuterol (PROVENTIL) (2.5 MG/3ML) 0.083% nebulizer solution Take 3 mLs (2.5 mg total) by nebulization every 6 (six) hours as needed for wheezing or shortness of breath. 07/13/15  Yes [name], MD  budesonide-formoterol [facility]) 160-4.5 MCG/ACT inhaler Inhale 2 puffs into the lungs 2 (two) times daily.   Yes Historical Provider, MD  cloNIDine (CATAPRES) 0.1 MG tablet Take 1 tablet (0.1 mg total) by mouth 2 (two) times daily. 07/06/15  Yes Fritzi Mandes, MD  Docusate Calcium (STOOL SOFTENER PO) Take by mouth.   Yes  Historical Provider, MD  feeding supplement, ENSURE ENLIVE, (ENSURE ENLIVE) LIQD Take 237 mLs by mouth 2 (two) times daily between meals. 07/06/15  Yes Fritzi Mandes, MD  Fluticasone Furoate-Vilanterol 100-25 MCG/INH AEPB Inhale 1 puff into the lungs daily. 07/13/15  Yes [name], MD  ipratropium (ATROVENT) 0.06 % nasal spray Place 2 sprays into the nose daily as needed.    Yes Historical Provider, MD  levothyroxine (SYNTHROID, LEVOTHROID) 50 MCG tablet  Take 50 mcg by mouth daily before breakfast.   Yes Reynold Bowen, MD  losartan-hydrochlorothiazide (HYZAAR) 50-12.5 MG tablet Take 1 tablet by mouth daily. 07/13/15 07/12/16 Yes [name], MD  metoprolol tartrate (LOPRESSOR) 25 MG tablet Take 1 tablet (25 mg total) by mouth 2 (two) times daily. 07/06/15  Yes Fritzi Mandes, MD  pantoprazole (PROTONIX) 40 MG tablet Take 40 mg by mouth daily.   Yes Historical Provider, MD  saccharomyces boulardii (FLORASTOR) 250 MG capsule Take 250 mg by mouth daily.   Yes Historical Provider, MD  tiotropium (SPIRIVA) 18 MCG inhalation capsule Place 1 capsule (18 mcg total) into inhaler and inhale daily. 07/06/15  Yes Fritzi Mandes, MD  traMADol (ULTRAM) 50 MG tablet Take by mouth at bedtime as needed.   Yes Historical Provider, MD  zolpidem (AMBIEN) 5 MG tablet Take 5 mg by mouth at bedtime as needed for sleep.   Yes Historical Provider, MD  amLODipine (NORVASC) 5 MG tablet Take 1 tablet (5 mg total) by mouth daily. Patient not taking: Reported on 07/15/2015 07/06/15   Fritzi Mandes, MD   Allergies  Allergen Reactions  . Fexofenadine Other (See Comments)  . Quinapril Hcl Other (See Comments)    Unknown   . Latex Rash     FAMILY HISTORY   Family History  Problem Relation Age of Onset  . Colon cancer Neg Hx   . Esophageal cancer Neg Hx   . Rectal cancer Neg Hx   . Stomach cancer Neg Hx   . Leukemia Maternal Aunt   . Thyroid cancer Daughter 87  . Diabetes Son       SOCIAL HISTORY    reports that he quit smoking about 52 years ago. His smoking use included Cigarettes. He has never used smokeless tobacco. He reports that he drinks alcohol. He reports that he does not use illicit drugs.  Review of Systems  Constitutional: Positive for malaise/fatigue. Negative for fever, chills and weight loss.  Eyes: Negative for blurred vision and double vision.  Respiratory: Positive for shortness of breath.   Gastrointestinal: Negative for heartburn and nausea.   Genitourinary: Negative for dysuria.  Skin: Negative for itching and rash.  Neurological: Positive for weakness. Negative for dizziness and headaches.  Endo/Heme/Allergies: Does not bruise/bleed easily.      VITAL SIGNS    Temp:  [97.8 F (36.6 C)-98.9 F (37.2 C)] 97.8 F (36.6 C) (10/23 1319) Pulse Rate:  [62-69] 66 (10/23 1319) Resp:  [18-20] 19 (10/23 1319) BP: (107-129)/(48-58) 109/48 mmHg (10/23 1202) SpO2:  [92 %-98 %] 98 % (10/23 1319) Weight:  [144 lb 9.6 oz (65.59 kg)] 144 lb 9.6 oz (65.59 kg) (10/23 0500) HEMODYNAMICS:   VENTILATOR SETTINGS:   INTAKE / OUTPUT:  Intake/Output Summary (Last 24 hours) at 07/19/15 1454 Last data filed at 07/19/15 1000  Gross per 24 hour  Intake    601 ml  Output    225 ml  Net    376 ml       PHYSICAL EXAM  Physical Exam  Constitutional: He is oriented to person, place, and time.  HENT:  Head: Normocephalic and atraumatic.  Right Ear: External ear normal.  Left Ear: External ear normal.  Eyes: EOM are normal. Pupils are equal, round, and reactive to light.  Neck: Normal range of motion. Neck supple.  Cardiovascular: Normal rate, regular rhythm and normal heart sounds.   Pulmonary/Chest: He has no wheezes. He has no rales.  On BiPAP No use of accessory muscles Mild shallow breath sounds at bases  Abdominal: Soft. He exhibits no distension. There is no tenderness.  Neurological: He is alert and oriented to person, place, and time.  Skin: Skin is warm.  Nursing note and vitals reviewed.      LABS   LABS:  CBC  Recent Labs Lab 07/16/15 1241  WBC 6.3  HGB 13.7  HCT 41.2  PLT 304   Coag's No results for input(s): APTT, INR in the last 168 hours. BMET  Recent Labs Lab 07/17/15 0429 07/17/15 1347 07/18/15 0537 07/19/15 0400 07/19/15 1232  NA 126* 122* 121* 121* 122*  K 3.6 3.9 3.7  --   --   CL 84* 84* 84*  --   --   CO2 35* 30 31  --   --   BUN '8 9 10  '$ --   --   CREATININE 0.69 0.76 0.72   --   --   GLUCOSE 112* 140* 128*  --   --    Electrolytes  Recent Labs Lab 07/17/15 0429 07/17/15 1347 07/18/15 0537 07/19/15 1232  CALCIUM 8.5* 8.1* 7.8*  --   MG  --   --   --  1.4*   Sepsis Markers No results for input(s): LATICACIDVEN, PROCALCITON, O2SATVEN in the last 168 hours. ABG  Recent Labs Lab 07/19/15 1220  PHART 7.29*  PCO2ART 77*  PO2ART 58*   Liver Enzymes No results for input(s): AST, ALT, ALKPHOS, BILITOT, ALBUMIN in the last 168 hours. Cardiac Enzymes No results for input(s): TROPONINI, PROBNP in the last 168 hours. Glucose No results for input(s): GLUCAP in the last 168 hours.   No results found for this or any previous visit (from the past 240 hour(s)).   Current facility-administered medications:  .  0.9 %  sodium chloride infusion, 250 mL, Intravenous, PRN, Hillary Bow, MD .  acetaminophen (TYLENOL) tablet 650 mg, 650 mg, Oral, Q6H PRN **OR** acetaminophen (TYLENOL) suppository 650 mg, 650 mg, Rectal, Q6H PRN, Hillary Bow, MD .  acidophilus (RISAQUAD) capsule 1 capsule, 1 capsule, Oral, Daily, Hillary Bow, MD, 1 capsule at 07/19/15 0828 .  albuterol (PROVENTIL) (2.5 MG/3ML) 0.083% nebulizer solution 2.5 mg, 2.5 mg, Nebulization, Q2H PRN, Srikar Sudini, MD .  aspirin EC tablet 81 mg, 81 mg, Oral, Daily, Hillary Bow, MD, 81 mg at 07/19/15 0827 .  budesonide-formoterol (SYMBICORT) 160-4.5 MCG/ACT inhaler 2 puff, 2 puff, Inhalation, BID, Hillary Bow, MD, 2 puff at 07/19/15 6712 .  cloNIDine (CATAPRES) tablet 0.1 mg, 0.1 mg, Oral, BID, Srikar Sudini, MD, 0.1 mg at 07/19/15 0827 .  enoxaparin (LOVENOX) injection 40 mg, 40 mg, Subcutaneous, Q24H, Srikar Sudini, MD, 40 mg at 07/18/15 2032 .  feeding supplement (ENSURE ENLIVE) (ENSURE ENLIVE) liquid 237 mL, 237 mL, Oral, BID BM, Srikar Sudini, MD, 237 mL at 07/19/15 1006 .  furosemide (LASIX) tablet 20 mg, 20 mg, Oral, Daily, Srikar Sudini, MD, 20 mg at 07/19/15 4580 .  hydrALAZINE (APRESOLINE)  injection 10 mg, 10 mg, Intravenous, Q6H PRN, Hillary Bow, MD .  HYDROcodone-acetaminophen (NORCO/VICODIN) 5-325 MG per tablet 1-2 tablet, 1-2 tablet, Oral, Q4H PRN, Srikar Sudini, MD .  ipratropium (ATROVENT) 0.03 % nasal spray 2 spray, 2 spray, Each Nare, QID, Hillary Bow, MD, 2 spray at 07/19/15 0831 .  levothyroxine (SYNTHROID, LEVOTHROID) tablet 50 mcg, 50 mcg, Oral, QAC breakfast, Hillary Bow, MD, 50 mcg at 07/19/15 0827 .  losartan (COZAAR) tablet 50 mg, 50 mg, Oral, Daily, Hillary Bow, MD, 50 mg at 07/19/15 0826 .  metoprolol tartrate (LOPRESSOR) tablet 25 mg, 25 mg, Oral, BID, Hillary Bow, MD, 25 mg at 07/19/15 0827 .  ondansetron (ZOFRAN) tablet 4 mg, 4 mg, Oral, Q6H PRN **OR** ondansetron (ZOFRAN) injection 4 mg, 4 mg, Intravenous, Q6H PRN, Srikar Sudini, MD .  pantoprazole (PROTONIX) EC tablet 40 mg, 40 mg, Oral, QAC breakfast, Hillary Bow, MD, 40 mg at 07/19/15 0825 .  polyethylene glycol (MIRALAX / GLYCOLAX) packet 17 g, 17 g, Oral, Daily PRN, Hillary Bow, MD, 17 g at 07/19/15 0837 .  sodium chloride (hypertonic) 3 % solution, , Intravenous, Continuous, Srikar Sudini, MD, Last Rate: 30 mL/hr at 07/19/15 0841, 30 mL/hr at 07/19/15 0841 .  sodium chloride 0.9 % injection 3 mL, 3 mL, Intravenous, Q12H, Hillary Bow, MD, 3 mL at 07/19/15 8413 .  sodium chloride 0.9 % injection 3 mL, 3 mL, Intravenous, PRN, Hillary Bow, MD .  tamsulosin (FLOMAX) capsule 0.4 mg, 0.4 mg, Oral, Daily, Harmeet Singh, MD, 0.4 mg at 07/19/15 0826 .  tiotropium (SPIRIVA) inhalation capsule 18 mcg, 18 mcg, Inhalation, Daily, Hillary Bow, MD, 18 mcg at 07/19/15 0830 .  traMADol (ULTRAM) tablet 50 mg, 50 mg, Oral, 4 times per day, Hillary Bow, MD, 50 mg at 07/19/15 1133  IMAGING    No results found.    Indwelling Urinary Catheter continued, requirement due to   Reason to continue Indwelling Urinary Catheter for strict Intake/Output monitoring for hemodynamic instability   Central Line  continued, requirement due to   Reason to continue [employer] Monitoring of central venous pressure or other hemodynamic parameters   Ventilator continued, requirement due to, resp failure    Ventilator Sedation RASS 0 to -2      ASSESSMENT/PLAN  81 year old male admitted for hyponatremia, recent admission for hyponatremia, sodium was correcting as an outpatient, but started to decline and was readmitted, today had an acute hypoxic hypercarbic event and had to be placed on BiPAP and transferred to the ICU, now in stable condition  Pulmonary Acute on chronic hypercarbic respiratory distress - he is currently on BiPAP and tolerating it well. Plan for BiPAP 2 hours in the morning, 9 AM-11 AM, and 3 PM-5 PM, and at night minimum 4 hours with sleep; this is only for inpatient use -Patient has an outpatient sleep study scheduled in one week -Respiratory failure, hypoxic and hypercapnic, acute-resolving -Continue with BiPAP as tolerated, minimal 4 hours at night, may take breaks, may use 2 hours in the morning and 2 hours in the afternoon. -ABG as needed  RUL nodule - possible primary lung cancer. We'll consider ENB, bronchoscopy when clinically stable. Oncology has been consulted, he supposed to have a PET CT sometime this week -Hypercapnic respiratory failure, with hypoventilation, this appears to be chronic going back to May 2015. Suspect this is secondary to chronic hypoventilation from COPD, though neurological disease could also be a cause. -Continue with duonebs -Will follow up with Dr. 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 -Currently on 3% normal saline, current sodium at 122  GASTROINTESTINAL Hx of IBS No acute issues at this time. We'll continue H2 blocker.  HEMATOLOGIC A: RUL spiculated lung lesion - 1.5cm P:  - workup in the future, with biopsy (possibly ENB  bronchoscopy when the patient is stable.) -Oncology consult noted.  NEUROLOGIC -follows commands   I have personally obtained a history, examined the patient, evaluated laboratory and imaging results, formulated the assessment and plan and placed orders. CRITICAL CARE: The patient is critically ill with multiple organ systems failure and requires high complexity decision making for assessment and support, frequent evaluation and titration of therapies, application of advanced monitoring technologies and extensive interpretation of multiple databases. Critical Care Time devoted to patient care services described in this note is 45 minutes.    Vilinda Boehringer, MD  Pulmonary and Critical Care Pager [phone] (please enter 7-digits) On Call Pager - [phone] (please enter 7-digits)   07/19/2015, 2:54 PM

## 2015-07-19 NOTE — Progress Notes (Signed)
TC from son reporting "he won't wake up." Son reports pt did this prior to admission with elevated CO2 retention. Family has sat pt up to eat. Pt tries to open eyes, resistive to care, responds to vigourous stimulation. VSS. Skin warm and dry. Wife and son at bedside. TC to [place] with change in mental status and family concern. 3% NS infusing well-IV site remains normal at every 30 minute checks. TC to resp advised of stat ABG order.

## 2015-07-19 NOTE — Progress Notes (Signed)
Subjective:  Patient is readmitted from his primary care's office due to hyponatremia. Patient's family - daughter (who is a Software engineer) and wife report that after his discharge from the hospital last time, he had significant lower extremity edema. Recently, he was placed on hydrochlorothiazide. He got 2 doses-  one on Tuesday and one on Wednesday. His oral intake was relatively poor over these 2 days. However, his wife was encouraging him to drink lots of water.  Overall feels fair. Able to eat without nausea or vomiting. Worked with PT. Nurse reports stiffness and unsteady gait Sodium level has dropped back to 121 Normal saline has been discontinued. 3% saline started this morning   Objective:  Vital signs in last 24 hours:  Temp:  [98.2 F (36.8 C)-98.9 F (37.2 C)] 98.4 F (36.9 C) (10/23 0450) Pulse Rate:  [67-85] 67 (10/23 0825) Resp:  [17-20] 20 (10/23 0825) BP: (107-129)/(48-58) 129/58 mmHg (10/23 0825) SpO2:  [92 %-95 %] 95 % (10/23 0825) Weight:  [65.59 kg (144 lb 9.6 oz)] 65.59 kg (144 lb 9.6 oz) (10/23 0500)  Weight change: 3.493 kg (7 lb 11.2 oz) Filed Weights   07/17/15 0500 07/18/15 0636 07/19/15 0500  Weight: 61.871 kg (136 lb 6.4 oz) 62.097 kg (136 lb 14.4 oz) 65.59 kg (144 lb 9.6 oz)    Intake/Output:    Intake/Output Summary (Last 24 hours) at 07/19/15 1125 Last data filed at 07/19/15 1000  Gross per 24 hour  Intake   2654 ml  Output    425 ml  Net   2229 ml     Physical Exam: General:  chronically ill-appearing , laying in the bed   HEENT  anicteric, moist oral mucous membranes   Neck supple  Pulm/lungs  normal respiratory effort, clear to auscultation bilaterally   CVS/Heart  no rub or gallop   Abdomen:  Soft, NT  Extremities: + some dependent edema  Neurologic:  alert, oriented, speech normal, no focal deficit   Skin: No acute rashes  Access:        Basic Metabolic Panel:   Recent Labs Lab 07/15/15 1227  07/16/15 1241 07/17/15 0429  07/17/15 1347 07/18/15 0537 07/19/15 0400  NA 124*  < > 118* 126* 122* 121* 121*  K 4.5  --  4.1 3.6 3.9 3.7  --   CL 83*  --  77* 84* 84* 84*  --   CO2 34*  --  28 35* 30 31  --   GLUCOSE 197*  --  159* 112* 140* 128*  --   BUN 10  --  '8 8 9 10  '$ --   CREATININE 0.87  --  0.78 0.69 0.76 0.72  --   CALCIUM 9.3  --  8.5* 8.5* 8.1* 7.8*  --   < > = values in this interval not displayed.   CBC:  Recent Labs Lab 07/12/15 1435 07/16/15 1241  WBC 7.0 6.3  NEUTROABS 5.1  --   HGB 12.8* 13.7  HCT 40.3 41.2  MCV 95.4 93.1  PLT 280 304      Microbiology:  Recent Results (from the past 720 hour(s))  Blood culture (routine x 2)     Status: None   Collection Time: 06/25/15  5:19 PM  Result Value Ref Range Status   Specimen Description BLOOD RIGHT ARM  Final   Special Requests BAA,AER5ML,ANA5ML  Final   Culture NO GROWTH 6 DAYS  Final   Report Status 07/01/2015 FINAL  Final  Blood culture (routine  x 2)     Status: None   Collection Time: 06/25/15  5:57 PM  Result Value Ref Range Status   Specimen Description BLOOD RIGHT HAND  Final   Special Requests BAA, ANA5ML,AER5ML  Final   Culture NO GROWTH 6 DAYS  Final   Report Status 07/01/2015 FINAL  Final  MRSA PCR Screening     Status: None   Collection Time: 06/26/15  9:50 AM  Result Value Ref Range Status   MRSA by PCR NEGATIVE NEGATIVE Final    Comment:        The GeneXpert MRSA Assay (FDA approved for NASAL specimens only), is one component of a comprehensive MRSA colonization surveillance program. It is not intended to diagnose MRSA infection nor to guide or monitor treatment for MRSA infections.   Culture, respiratory (NON-Expectorated)     Status: None   Collection Time: 06/26/15 12:26 PM  Result Value Ref Range Status   Specimen Description TRACHEAL ASPIRATE  Final   Special Requests Normal  Final   Gram Stain   Final    FAIR SPECIMEN - 70-80% WBCS FEW WBC SEEN MODERATE GRAM POSITIVE COCCI RARE YEAST     Culture APPEARS TO BE NORMAL FLORA  Final   Report Status 06/29/2015 FINAL  Final  C difficile quick scan w PCR reflex     Status: None   Collection Time: 07/05/15  3:00 AM  Result Value Ref Range Status   C Diff antigen NEGATIVE NEGATIVE Final   C Diff toxin NEGATIVE NEGATIVE Final   C Diff interpretation Negative for C. difficile  Final    Coagulation Studies: No results for input(s): LABPROT, INR in the last 72 hours.  Urinalysis:  Recent Labs  07/16/15 1442  COLORURINE STRAW*  LABSPEC 1.008  PHURINE 7.0  GLUCOSEU NEGATIVE  HGBUR NEGATIVE  BILIRUBINUR NEGATIVE  KETONESUR NEGATIVE  PROTEINUR 100*  NITRITE NEGATIVE  LEUKOCYTESUR NEGATIVE      Imaging: No results found.   Medications:   . sodium chloride (hypertonic) 30 mL/hr (07/19/15 0841)   . acidophilus  1 capsule Oral Daily  . aspirin EC  81 mg Oral Daily  . budesonide-formoterol  2 puff Inhalation BID  . cloNIDine  0.1 mg Oral BID  . enoxaparin (LOVENOX) injection  40 mg Subcutaneous Q24H  . feeding supplement (ENSURE ENLIVE)  237 mL Oral BID BM  . furosemide  20 mg Oral Daily  . ipratropium  2 spray Each Nare QID  . levothyroxine  50 mcg Oral QAC breakfast  . losartan  50 mg Oral Daily  . metoprolol tartrate  25 mg Oral BID  . pantoprazole  40 mg Oral QAC breakfast  . sodium chloride  3 mL Intravenous Q12H  . tamsulosin  0.4 mg Oral Daily  . tiotropium  18 mcg Inhalation Daily  . traMADol  50 mg Oral 4 times per day   sodium chloride, acetaminophen **OR** acetaminophen, albuterol, hydrALAZINE, HYDROcodone-acetaminophen, ondansetron **OR** ondansetron (ZOFRAN) IV, polyethylene glycol, sodium chloride  Assessment/ Plan:  81 y.o. male with medical problems of COPD, and diet-controlled diabetes, DJD, hypertension, polyneuropathy, dementia who was admitted to [facility] on 06/25/2015 for evaluation of altered mental status.   1. Acute hyponatremia Likely secondary to use of hydrochlorothiazide and drinking  excessive water with poor by mouth intake(tea Titus Mould) diet, also possibility of underlying SIADH - Started on 3% saline to improve sodium, then sustain with Lasix + salt combination - We'll continue to follow - Agree with monitoring sodium level closely -  Goal sodium level by tomorrow morning is 129  2. BPH, with frequency of urination - appreciate urologist evaluation - Trial of Flomax to see if symptoms improve  3. Edema - d/c saline  Discussed with patient's son, wife and the patient   LOS: 3 Kennede Lusk 10/23/201611:25 AM

## 2015-07-19 NOTE — Progress Notes (Signed)
TC from [place], resp.therapist advised pt is retaining with recommendations for BIPAP and she will call Dr. Darvin Neighbours. TC from Dr. Darvin Neighbours advising pt needs to be transferred to step down for BIPAP. Will prepare to transfer. Wife and son updated and agreeable.

## 2015-07-19 NOTE — Plan of Care (Signed)
Problem: Discharge Progression Outcomes Goal: Other Discharge Outcomes/Goals Outcome: Progressing Plan of care progress to goals: Discharge plan: pt desires to return home; may need rehab when discharged from hospital.   Hemodynamically stable: VSS. Last Sodium level 121, AM labs ordered, continue to monitor.   Complications resolved/controlled: Pt voids small amounts using urinal. Immediate post void bladder scan results were 142m. Pt states that he feels relief, continue to monitor. PRN post void bladder scan order entered. Diet: Tolerating diet well; no c/o nausea or vomiting this shift.  Activity appropriate for discharge: Pt alert with mild confusion noted. Rested quietly in bed. Son at bedside early in the shift.

## 2015-07-19 NOTE — Progress Notes (Signed)
TC reported to [place] with pt accepted.  Ready to transfer to stepdown.

## 2015-07-19 NOTE — Progress Notes (Signed)
Case discussed with patient's daughter. Patient was lethargic and was found to have high PCO2; placed on BiPAP and transferred to ICU Family was concerned if 3% saline is contributing to hypersomnolence Reassured that it is not the case Discussed with [facility] to evaluate patient for OSA (? Needs ENT eval)

## 2015-07-20 ENCOUNTER — Inpatient Hospital Stay: Payer: Medicare Other

## 2015-07-20 ENCOUNTER — Inpatient Hospital Stay: Payer: TRICARE For Life (TFL)

## 2015-07-20 ENCOUNTER — Inpatient Hospital Stay: Payer: TRICARE For Life (TFL) | Admitting: Oncology

## 2015-07-20 DIAGNOSIS — J9620 Acute and chronic respiratory failure, unspecified whether with hypoxia or hypercapnia: Secondary | ICD-10-CM

## 2015-07-20 DIAGNOSIS — I10 Essential (primary) hypertension: Secondary | ICD-10-CM

## 2015-07-20 DIAGNOSIS — R600 Localized edema: Secondary | ICD-10-CM

## 2015-07-20 DIAGNOSIS — R739 Hyperglycemia, unspecified: Secondary | ICD-10-CM

## 2015-07-20 DIAGNOSIS — R630 Anorexia: Secondary | ICD-10-CM

## 2015-07-20 DIAGNOSIS — R400 Somnolence: Secondary | ICD-10-CM

## 2015-07-20 DIAGNOSIS — Z87891 Personal history of nicotine dependence: Secondary | ICD-10-CM

## 2015-07-20 DIAGNOSIS — J9690 Respiratory failure, unspecified, unspecified whether with hypoxia or hypercapnia: Secondary | ICD-10-CM

## 2015-07-20 DIAGNOSIS — J449 Chronic obstructive pulmonary disease, unspecified: Secondary | ICD-10-CM

## 2015-07-20 DIAGNOSIS — R5381 Other malaise: Secondary | ICD-10-CM

## 2015-07-20 DIAGNOSIS — R918 Other nonspecific abnormal finding of lung field: Secondary | ICD-10-CM

## 2015-07-20 DIAGNOSIS — R350 Frequency of micturition: Secondary | ICD-10-CM

## 2015-07-20 LAB — BLOOD GAS, ARTERIAL
ACID-BASE EXCESS: 7.8 mmol/L — AB (ref 0.0–3.0)
BICARBONATE: 36.1 meq/L — AB (ref 21.0–28.0)
Expiratory PAP: 5
FIO2: 0.24
Inspiratory PAP: 10
O2 SAT: 96 %
PATIENT TEMPERATURE: 37
pCO2 arterial: 67 mmHg — ABNORMAL HIGH (ref 32.0–48.0)
pH, Arterial: 7.34 — ABNORMAL LOW (ref 7.350–7.450)
pO2, Arterial: 87 mmHg (ref 83.0–108.0)

## 2015-07-20 LAB — SODIUM
SODIUM: 129 mmol/L — AB (ref 135–145)
SODIUM: 128 mmol/L — AB (ref 135–145)
Sodium: 126 mmol/L — ABNORMAL LOW (ref 135–145)
Sodium: 130 mmol/L — ABNORMAL LOW (ref 135–145)

## 2015-07-20 LAB — URINALYSIS COMPLETE WITH MICROSCOPIC (ARMC ONLY)
BILIRUBIN URINE: NEGATIVE
GLUCOSE, UA: NEGATIVE mg/dL
Hgb urine dipstick: NEGATIVE
Ketones, ur: NEGATIVE mg/dL
Leukocytes, UA: NEGATIVE
Nitrite: NEGATIVE
PH: 5 (ref 5.0–8.0)
Protein, ur: NEGATIVE mg/dL
SQUAMOUS EPITHELIAL / LPF: NONE SEEN
Specific Gravity, Urine: 1.01 (ref 1.005–1.030)

## 2015-07-20 LAB — OSMOLALITY: Osmolality: 265 mOsm/kg — ABNORMAL LOW (ref 275–295)

## 2015-07-20 MED ORDER — ZOLPIDEM TARTRATE 5 MG PO TABS
5.0000 mg | ORAL_TABLET | Freq: Once | ORAL | Status: AC
  Administered 2015-07-20: 5 mg via ORAL
  Filled 2015-07-20: qty 1

## 2015-07-20 MED ORDER — SODIUM CHLORIDE 3 % IV SOLN
INTRAVENOUS | Status: DC
Start: 2015-07-20 — End: 2015-07-20
  Filled 2015-07-20: qty 500

## 2015-07-20 MED ORDER — SODIUM CHLORIDE 0.9 % IV SOLN
INTRAVENOUS | Status: DC
  Administered 2015-07-20: 03:00:00 via INTRAVENOUS

## 2015-07-20 MED ORDER — SODIUM CHLORIDE 0.9 % IV SOLN
INTRAVENOUS | Status: DC
  Administered 2015-07-20 – 2015-07-21 (×4): via INTRAVENOUS

## 2015-07-20 NOTE — Progress Notes (Signed)
PT Cancellation Note  Patient Details Name: Aaron Hendrix MRN: [id number] DOB: Sep 05, 1930   Cancelled Treatment:    Reason Eval/Treat Not Completed: Patient at procedure or test/unavailable (attempted to see pt twice today; once he was in the middle of eating and requested we come back later. Second time pt was receiving a bath from [employer]. Will re-attempt at later date. )   Milon Score 07/20/2015, 3:30 PM

## 2015-07-20 NOTE — Progress Notes (Signed)
Per Hower hold NS until PICC is placed.

## 2015-07-20 NOTE — Progress Notes (Signed)
[place] [facility] [place] NAME: Aaron Hendrix    MR#:  [id number]  DATE OF BIRTH:  1930-02-13    CHIEF COMPLAINT:   Chief Complaint  Patient presents with  . abnormal labs   feeling much better. Na 130->129 REVIEW OF SYSTEMS:   ROS CONSTITUTIONAL: No fever, fatigue or weakness.  EYES: No blurred or double vision.  EARS, NOSE, AND THROAT: No tinnitus or ear pain.  RESPIRATORY: No cough, shortness of breath, wheezing or hemoptysis.  CARDIOVASCULAR: No chest pain, orthopnea, edema.  GASTROINTESTINAL: No nausea, vomiting, diarrhea or abdominal pain.  GENITOURINARY: No dysuria, hematuria.  ENDOCRINE: No polyuria, nocturia,  HEMATOLOGY: No anemia, easy bruising or bleeding SKIN: No rash or lesion. MUSCULOSKELETAL: No joint pain or arthritis.  Generalized weakness NEUROLOGIC: No tingling, numbness, weakness.  PSYCHIATRY: No anxiety or depression. DRUG ALLERGIES:   Allergies  Allergen Reactions  . Fexofenadine Other (See Comments)  . Quinapril Hcl Other (See Comments)    Unknown   . Latex Rash   VITALS:  Blood pressure 111/50, pulse 75, temperature 98.4 F (36.9 C), temperature source Oral, resp. rate 20, height '5\' 4"'$  (1.626 m), weight 62.5 kg (137 lb 12.6 oz), SpO2 100 %. PHYSICAL EXAMINATION:  GENERAL:  81 y.o.-year-old patient lying in the bed with no acute distress.  EYES: Pupils equal, round, reactive to light and accommodation. No scleral icterus. Extraocular muscles intact.  HEENT: Head atraumatic, normocephalic. Oropharynx and nasopharynx clear.  NECK:  Supple, no jugular venous distention. No thyroid enlargement, no tenderness.  LUNGS: Normal breath sounds bilaterally, no wheezing, rales,rhonchi or crepitation. No use of accessory muscles of respiration.  CARDIOVASCULAR: S1, S2 normal. No murmurs, rubs, or gallops.  ABDOMEN: Soft, nontender, nondistended. Bowel sounds present. No organomegaly or mass.  EXTREMITIES: No  pedal edema, cyanosis, or clubbing.  NEUROLOGIC: Cranial nerves II through XII are intact. Muscle strength 5/5 in all extremities. Sensation intact. Gait not checked.  PSYCHIATRIC: The patient is alert and oriented x 3.  SKIN: No obvious rash, lesion, or ulcer.  LABORATORY PANEL:   CBC  Recent Labs Lab 07/16/15 1241  WBC 6.3  HGB 13.7  HCT 41.2  PLT 304   ------------------------------------------------------------------------------------------------------------------  Chemistries   Recent Labs Lab 07/18/15 0537  07/19/15 1232  07/20/15 1322  NA 121*  < > 122*  < > 129*  K 3.7  --   --   --   --   CL 84*  --   --   --   --   CO2 31  --   --   --   --   GLUCOSE 128*  --   --   --   --   BUN 10  --   --   --   --   CREATININE 0.72  --   --   --   --   CALCIUM 7.8*  --   --   --   --   MG  --   --  1.4*  --   --   < > = values in this interval not displayed.  RADIOLOGY:  Ct Chest Wo Contrast  07/20/2015  CLINICAL DATA:  Subsequent encounter for hyponatremia and COPD exacerbation. EXAM: CT CHEST WITHOUT CONTRAST TECHNIQUE: Multidetector CT imaging of the chest was performed following the standard protocol without IV contrast. COMPARISON:  06/25/2015. FINDINGS: Mediastinum / Lymph Nodes: There is no axillary lymphadenopathy. No mediastinal lymphadenopathy. There is no hilar lymphadenopathy.  Heart is borderline enlarged. Coronary artery calcification is noted. Lungs / Pleura: 1.5 cm spiculated lesion in the right upper lobe previously has decreased substantially in the interval and has nearly resolved. Other patchy nodularity in the posterior right upper lobe has improved in the interval. Interval development of compressive atelectasis in the dependent lung bases with small bilateral pleural effusions. Superimposed infection in the right base cannot be entirely excluded. MSK / Soft Tissues: Bone windows reveal no worrisome lytic or sclerotic osseous lesions. Gas is identified in the  right axillary region and may be within venous anatomy or within the soft tissues. Upper Abdomen: Trace amount of free fluid is seen adjacent to the liver. There is some edema in the retroperitoneal fat, slightly progressed in the interval and nonspecific. IMPRESSION: 1. Interval near complete resolution of the dominant spiculated opacity in the right upper lobe with associated interval improvement in the adjacent patchy nodularity. 2. Interval development of dependent collapse/consolidation, right greater than left with small bilateral pleural effusions. 3. Soft tissue gas in the right axilla may be within venous anatomy. Correlation for venous access in the right upper extremity may prove helpful. Soft tissue gas from laceration or infection would also be a consideration. 4. Small volume perihepatic ascites. Electronically Signed   By: Misty Stanley M.D.   On: 07/20/2015 12:34   Dg Chest Port 1 View  07/20/2015  CLINICAL DATA:  PICC placement.  Initial encounter. EXAM: PORTABLE CHEST 1 VIEW COMPARISON:  Chest radiograph performed 07/16/2015 FINDINGS: The right PICC is coiled within the patient's proximal right arm. Scattered associated soft tissue air is noted. This is likely coiled within the soft tissues. The lungs are well-aerated. Minimal left basilar atelectasis is noted. There is no evidence of pleural effusion or pneumothorax. The cardiomediastinal silhouette is borderline normal in size. No acute osseous abnormalities are seen. IMPRESSION: 1. Right PICC coiled within the patient's proximal right arm, with scattered associated soft tissue air. This is likely coiled within the soft tissues, and should be removed. 2. Minimal left basilar atelectasis noted.  Lungs otherwise clear. These results were called by telephone at the time of interpretation on 07/20/2015 at 12:50 am to Nursing at the [facility] ICU, who verbally acknowledged these results. Electronically Signed   By: Garald Balding M.D.    On: 07/20/2015 00:53   ASSESSMENT AND PLAN:   * Hyponatremia Likely from SIADH and low solute load. Contributed by hydrochlorothiazide. Sodium is stable at 121->130->129 today. stopped 3% saline. Monitor Na  * BPH Appreciate urology input Flomax started. Outpatient urology follow-up.  * Right lung nodule: CT SHOWED dependent collapse/consolidation, right greater than left with small bilateral pleural effusions. No mass  * diabetes mellitus diet controlled  * hypertension  Hydrochlorothiazide stopped due to hyponatremia  metoprolol, losartan, Norvasc, clonidine. Stopped Norvasc due to low normal blood pressure.  * history of COPD: No wheezing at this time continue Spiriva.  * Insomnia continue Ambien 5 mg as needed at night.  Plan was discussed in detail with family in the room with her daughter there. All questions answered.  CODE STATUS: full  TOTAL TIME TAKING CARE OF THIS PATIENT: 35 minutes.   Greater than 50% time spent in discussing with patient and family at bedside. (daughter and wife)  POSSIBLE D/C IN 1-2 DAYS, DEPENDING ON CLINICAL CONDITION. Can transfer to floors if they can handle BIPAP on floors.   [facility], Markus Casten M.D on 07/20/2015 at 4:25 PM  Between 7am to 6pm - Pager -  [phone]  After 6pm go to [email or web address] - password [email or web address] [facility]  Office  [phone]  CC: Primary care physician; [name], MD

## 2015-07-20 NOTE — Progress Notes (Signed)
Subjective:  3% saline stopped as pt doesn't have central line at the moment. Discussed with Dr. Mortimer Fries.  Central line to be placed this AM.  Current Na was up to 130. Pt currently on NS.    Objective:  Vital signs in last 24 hours:  Temp:  [97.8 F (36.6 C)-98.6 F (37 C)] 98.3 F (36.8 C) (10/24 0800) Pulse Rate:  [55-89] 82 (10/24 0952) Resp:  [11-25] 15 (10/24 0800) BP: (94-171)/(38-71) 154/38 mmHg (10/24 0920) SpO2:  [93 %-100 %] 96 % (10/24 0800) Weight:  [62.5 kg (137 lb 12.6 oz)] 62.5 kg (137 lb 12.6 oz) (10/24 0419)  Weight change: -3.09 kg (-6 lb 13 oz) Filed Weights   07/18/15 0636 07/19/15 0500 07/20/15 0419  Weight: 62.097 kg (136 lb 14.4 oz) 65.59 kg (144 lb 9.6 oz) 62.5 kg (137 lb 12.6 oz)    Intake/Output:    Intake/Output Summary (Last 24 hours) at 07/20/15 1012 Last data filed at 07/20/15 1007  Gross per 24 hour  Intake  759.5 ml  Output   1305 ml  Net -545.5 ml     Physical Exam: General:  chronically ill-appearing, on bipap   HEENT  anicteric, moist oral mucous membranes   Neck supple  Pulm/lungs  normal respiratory effort, clear to auscultation bilaterally   CVS/Heart  S1S2 no rubs  Abdomen:  Soft, NTND, BS present  Extremities:  trace b/l edema  Neurologic:  alert, oriented, speech normal, no focal deficit   Skin: No acute rashes  Access:        Basic Metabolic Panel:   Recent Labs Lab 07/15/15 1227  07/16/15 1241 07/17/15 0429 07/17/15 1347 07/18/15 0537 07/19/15 0400 07/19/15 1232 07/19/15 1821 07/19/15 2356 07/20/15 0613  NA 124*  < > 118* 126* 122* 121* 121* 122* 125* 126* 130*  K 4.5  --  4.1 3.6 3.9 3.7  --   --   --   --   --   CL 83*  --  77* 84* 84* 84*  --   --   --   --   --   CO2 34*  --  28 35* 30 31  --   --   --   --   --   GLUCOSE 197*  --  159* 112* 140* 128*  --   --   --   --   --   BUN 10  --  '8 8 9 10  '$ --   --   --   --   --   CREATININE 0.87  --  0.78 0.69 0.76 0.72  --   --   --   --   --   CALCIUM  9.3  --  8.5* 8.5* 8.1* 7.8*  --   --   --   --   --   MG  --   --   --   --   --   --   --  1.4*  --   --   --   < > = values in this interval not displayed.   CBC:  Recent Labs Lab 07/16/15 1241  WBC 6.3  HGB 13.7  HCT 41.2  MCV 93.1  PLT 304      Microbiology:  Recent Results (from the past 720 hour(s))  Blood culture (routine x 2)     Status: None   Collection Time: 06/25/15  5:19 PM  Result Value Ref Range Status   Specimen Description BLOOD  RIGHT ARM  Final   Special Requests BAA,AER5ML,ANA5ML  Final   Culture NO GROWTH 6 DAYS  Final   Report Status 07/01/2015 FINAL  Final  Blood culture (routine x 2)     Status: None   Collection Time: 06/25/15  5:57 PM  Result Value Ref Range Status   Specimen Description BLOOD RIGHT HAND  Final   Special Requests BAA, ANA5ML,AER5ML  Final   Culture NO GROWTH 6 DAYS  Final   Report Status 07/01/2015 FINAL  Final  MRSA PCR Screening     Status: None   Collection Time: 06/26/15  9:50 AM  Result Value Ref Range Status   MRSA by PCR NEGATIVE NEGATIVE Final    Comment:        The GeneXpert MRSA Assay (FDA approved for NASAL specimens only), is one component of a comprehensive MRSA colonization surveillance program. It is not intended to diagnose MRSA infection nor to guide or monitor treatment for MRSA infections.   Culture, respiratory (NON-Expectorated)     Status: None   Collection Time: 06/26/15 12:26 PM  Result Value Ref Range Status   Specimen Description TRACHEAL ASPIRATE  Final   Special Requests Normal  Final   Gram Stain   Final    FAIR SPECIMEN - 70-80% WBCS FEW WBC SEEN MODERATE GRAM POSITIVE COCCI RARE YEAST    Culture APPEARS TO BE NORMAL FLORA  Final   Report Status 06/29/2015 FINAL  Final  C difficile quick scan w PCR reflex     Status: None   Collection Time: 07/05/15  3:00 AM  Result Value Ref Range Status   C Diff antigen NEGATIVE NEGATIVE Final   C Diff toxin NEGATIVE NEGATIVE Final   C Diff  interpretation Negative for C. difficile  Final  MRSA PCR Screening     Status: None   Collection Time: 07/19/15 12:24 PM  Result Value Ref Range Status   MRSA by PCR NEGATIVE NEGATIVE Final    Comment:        The GeneXpert MRSA Assay (FDA approved for NASAL specimens only), is one component of a comprehensive MRSA colonization surveillance program. It is not intended to diagnose MRSA infection nor to guide or monitor treatment for MRSA infections.     Coagulation Studies: No results for input(s): LABPROT, INR in the last 72 hours.  Urinalysis: No results for input(s): COLORURINE, LABSPEC, PHURINE, GLUCOSEU, HGBUR, BILIRUBINUR, KETONESUR, PROTEINUR, UROBILINOGEN, NITRITE, LEUKOCYTESUR in the last 72 hours.  Invalid input(s): APPERANCEUR    Imaging: Dg Chest Port 1 View  07/20/2015  CLINICAL DATA:  PICC placement.  Initial encounter. EXAM: PORTABLE CHEST 1 VIEW COMPARISON:  Chest radiograph performed 07/16/2015 FINDINGS: The right PICC is coiled within the patient's proximal right arm. Scattered associated soft tissue air is noted. This is likely coiled within the soft tissues. The lungs are well-aerated. Minimal left basilar atelectasis is noted. There is no evidence of pleural effusion or pneumothorax. The cardiomediastinal silhouette is borderline normal in size. No acute osseous abnormalities are seen. IMPRESSION: 1. Right PICC coiled within the patient's proximal right arm, with scattered associated soft tissue air. This is likely coiled within the soft tissues, and should be removed. 2. Minimal left basilar atelectasis noted.  Lungs otherwise clear. These results were called by telephone at the time of interpretation on 07/20/2015 at 12:50 am to Nursing at the [facility] ICU, who verbally acknowledged these results. Electronically Signed   By: Garald Balding M.D.   On: 07/20/2015 00:53  Medications:   . sodium chloride 125 mL/hr at 07/20/15 0916  . sodium chloride  (hypertonic) Stopped (07/20/15 0215)   . acidophilus  1 capsule Oral Daily  . aspirin EC  81 mg Oral Daily  . budesonide-formoterol  2 puff Inhalation BID  . enoxaparin (LOVENOX) injection  40 mg Subcutaneous Q24H  . feeding supplement (ENSURE ENLIVE)  237 mL Oral BID BM  . furosemide  20 mg Oral Daily  . levothyroxine  50 mcg Oral QAC breakfast  . losartan  50 mg Oral Daily  . magnesium oxide  400 mg Oral Daily  . metoprolol tartrate  25 mg Oral BID  . pantoprazole  40 mg Oral QAC breakfast  . sodium chloride  3 mL Intravenous Q12H  . tamsulosin  0.4 mg Oral Daily  . tiotropium  18 mcg Inhalation Daily   sodium chloride, acetaminophen **OR** acetaminophen, albuterol, hydrALAZINE, HYDROcodone-acetaminophen, ondansetron **OR** ondansetron (ZOFRAN) IV, polyethylene glycol, sodium chloride, traMADol  Assessment/ Plan:  81 y.o. male with medical problems of COPD, and diet-controlled diabetes, DJD, hypertension, polyneuropathy, dementia who was admitted to [facility] on 06/25/2015 for evaluation of altered mental status.   1. Acute hyponatremia Likely secondary to use of hydrochlorothiazide and drinking excessive water with poor by mouth intake(tea Titus Mould) diet, also possibility of underlying SIADH - Na actually up to 130 at the moment.  Will go ahead and stop IVFs for now.   Will consider adding salt tabs  2. BPH, with frequency of urination - appreciate urologist evaluation - continue flomax.  3. Lower extremity edema. -will stop NS as above.   LOS: 4 Jorje Vanatta 10/24/201610:12 AM

## 2015-07-20 NOTE — Care Management Note (Addendum)
Case Management Note  Patient Details  Name: CASMERE HOLLENBECK MRN: [id number] Date of Birth: Feb 14, 1930  Subjective/Objective:  Readmit from home. HX: RUL nodule being evaluated as possible lung CA. Oncology consulted. Admitted with hyponatremia. NA 130 today. At [facility] 09/29-10/10 for hyponatremia. Transferred to ICU 10/23 with lethargy, respiratory failure requiring BiPAP.  Active with [facility] with HHA and HHPT.   Corliss Blacker with [name] notified.                Action/Plan:  Will follow progression.    Expected Discharge Date:                  Expected Discharge Plan:  [place]  In-House Referral:     Discharge planning Services  CM Consult  Post Acute Care Choice:    Choice offered to:     DME Arranged:    DME Agency:     HH Arranged:  PT, Nurse's Aide HH Agency:  [facility]  Status of Service:     Medicare Important Message Given:    Date Medicare IM Given:    Medicare IM give by:    Date Additional Medicare IM Given:    Additional Medicare Important Message give by:     If discussed at [place] of Stay Meetings, dates discussed:    Additional Comments:  Jolly Mango, RN 07/20/2015, 9:02 AM

## 2015-07-20 NOTE — Progress Notes (Signed)
[place] NOTE  Pharmacy Consult for Sodium Monitoring Indication: Hyponatremia   Allergies  Allergen Reactions  . Fexofenadine Other (See Comments)  . Quinapril Hcl Other (See Comments)    Unknown   . Latex Rash    Patient Measurements: Height: '5\' 4"'$  (162.6 cm) Weight: 137 lb 12.6 oz (62.5 kg) IBW/kg (Calculated) : 59.2   Vital Signs: Temp: 98.3 F (36.8 C) (10/24 0800) Temp Source: Axillary (10/24 0800) BP: 154/38 mmHg (10/24 0920) Pulse Rate: 82 (10/24 0952) Intake/Output from previous day: 10/23 0701 - 10/24 0700 In: 877.5 [P.O.:118; I.V.:759.5] Out: 950 [Urine:950] Intake/Output from this shift: Total I/O In: -  Out: 580 [Urine:580] Vent settings for last 24 hours:    Labs: BMET    Component Value Date/Time   NA 130* 07/20/2015 0613   NA 132* 06/03/2014 0406   K 3.7 07/18/2015 0537   K 4.6 06/03/2014 0406   CL 84* 07/18/2015 0537   CL 96* 06/03/2014 0406   CO2 31 07/18/2015 0537   CO2 30 06/03/2014 0406   GLUCOSE 128* 07/18/2015 0537   GLUCOSE 87 06/03/2014 0406   BUN 10 07/18/2015 0537   BUN 29* 06/03/2014 0406   CREATININE 0.72 07/18/2015 0537   CREATININE 0.84 02/06/2015 1549   CREATININE 0.98 06/03/2014 0406   CALCIUM 7.8* 07/18/2015 0537   CALCIUM 8.7 06/03/2014 0406   GFRNONAA >60 07/18/2015 0537   GFRNONAA >60 06/03/2014 0406   GFRAA >60 07/18/2015 0537   GFRAA >60 06/03/2014 0406      Recent Labs  07/17/15 1347 07/18/15 0537 07/19/15 1232  CREATININE 0.76 0.72  --   MG  --   --  1.4*   Estimated Creatinine Clearance: 59.6 mL/min (by C-G formula based on Cr of 0.72).  No results for input(s): GLUCAP in the last 72 hours.  Microbiology: Recent Results (from the past 720 hour(s))  Blood culture (routine x 2)     Status: None   Collection Time: 06/25/15  5:19 PM  Result Value Ref Range Status   Specimen Description BLOOD RIGHT ARM  Final   Special Requests BAA,AER5ML,ANA5ML  Final   Culture NO GROWTH  6 DAYS  Final   Report Status 07/01/2015 FINAL  Final  Blood culture (routine x 2)     Status: None   Collection Time: 06/25/15  5:57 PM  Result Value Ref Range Status   Specimen Description BLOOD RIGHT HAND  Final   Special Requests BAA, ANA5ML,AER5ML  Final   Culture NO GROWTH 6 DAYS  Final   Report Status 07/01/2015 FINAL  Final  MRSA PCR Screening     Status: None   Collection Time: 06/26/15  9:50 AM  Result Value Ref Range Status   MRSA by PCR NEGATIVE NEGATIVE Final    Comment:        The GeneXpert MRSA Assay (FDA approved for NASAL specimens only), is one component of a comprehensive MRSA colonization surveillance program. It is not intended to diagnose MRSA infection nor to guide or monitor treatment for MRSA infections.   Culture, respiratory (NON-Expectorated)     Status: None   Collection Time: 06/26/15 12:26 PM  Result Value Ref Range Status   Specimen Description TRACHEAL ASPIRATE  Final   Special Requests Normal  Final   Gram Stain   Final    FAIR SPECIMEN - 70-80% WBCS FEW WBC SEEN MODERATE GRAM POSITIVE COCCI RARE YEAST    Culture APPEARS TO BE NORMAL FLORA  Final  Report Status 06/29/2015 FINAL  Final  C difficile quick scan w PCR reflex     Status: None   Collection Time: 07/05/15  3:00 AM  Result Value Ref Range Status   C Diff antigen NEGATIVE NEGATIVE Final   C Diff toxin NEGATIVE NEGATIVE Final   C Diff interpretation Negative for C. difficile  Final  MRSA PCR Screening     Status: None   Collection Time: 07/19/15 12:24 PM  Result Value Ref Range Status   MRSA by PCR NEGATIVE NEGATIVE Final    Comment:        The GeneXpert MRSA Assay (FDA approved for NASAL specimens only), is one component of a comprehensive MRSA colonization surveillance program. It is not intended to diagnose MRSA infection nor to guide or monitor treatment for MRSA infections.     Medications:  Scheduled:  . acidophilus  1 capsule Oral Daily  . aspirin EC  81  mg Oral Daily  . budesonide-formoterol  2 puff Inhalation BID  . enoxaparin (LOVENOX) injection  40 mg Subcutaneous Q24H  . feeding supplement (ENSURE ENLIVE)  237 mL Oral BID BM  . furosemide  20 mg Oral Daily  . levothyroxine  50 mcg Oral QAC breakfast  . losartan  50 mg Oral Daily  . magnesium oxide  400 mg Oral Daily  . metoprolol tartrate  25 mg Oral BID  . pantoprazole  40 mg Oral QAC breakfast  . sodium chloride  3 mL Intravenous Q12H  . tamsulosin  0.4 mg Oral Daily  . tiotropium  18 mcg Inhalation Daily   Infusions:  . sodium chloride (hypertonic) Stopped (07/20/15 0215)   PRN: sodium chloride, acetaminophen **OR** acetaminophen, albuterol, hydrALAZINE, HYDROcodone-acetaminophen, ondansetron **OR** ondansetron (ZOFRAN) IV, polyethylene glycol, sodium chloride, traMADol  Assessment: Pharmacy consulted to monitor serial sodium levels in this 81 y/o M admitted with hyponatremia and initially on hypertonic saline.   Goal of Therapy:  Normalization of serum sodium  Plan:  Sodium level rising at an acceptable rate with last level at 130 up from 121 last AM. Patient is no longer on hypertonic saline and has not been overnight per nursing due to access. Plan is to keep patient on NS with Na level progressing well. Will continue to monitor and plan on signing off it this trend in Na continues with patient no longer on hypertonic saline.   Ulice Dash D 07/20/2015,11:22 AM

## 2015-07-20 NOTE — Consult Note (Signed)
PULMONARY / CRITICAL CARE MEDICINE   Name: Aaron Hendrix MRN: [id number] DOB: 10-13-1929    ADMISSION DATE:  07/16/2015 CONSULTATION DATE:  07/19/15  REFERRING MD :  Dr. Darvin Neighbours   CHIEF COMPLAINT:    hyponatremia, lethargic   HISTORY OF PRESENT ILLNESS  Remains on biPAP, fio2 30% Alert, follows commands, will attempt to wean off biPAP Will repeat CT chest to assess for interval changes   SIGNIFICANT EVENTS   9/29>>unresponsive>>intubated>>hyponatremia, hypercarbia, RUL lesion 10/5>>extubated 10/10>>discharged 10/20>>readmitted for hyponatremia 10/23>>lethargic, with hypercarbia, transferred to ICU, placed on bipap    PAST MEDICAL HISTORY    :  Past Medical History  Diagnosis Date  . COPD with asthma ([place])   . Diabetes mellitus     Diet control   . Hyperlipidemia   . Carpal tunnel syndrome   . DJD (degenerative joint disease), cervical   . Vitamin D deficiency   . Tremor, essential   . Internal hemorrhoids   . Personal history of colonic polyps     adenomatous  . IBS (irritable bowel syndrome)   . Peptic ulcer   . Duodenal ulcer, with partial obstruction 08/10/2012  . Numbness and tingling in right hand     started 2 yeas ago  . Hypertension     no medicine needed  . Memory deficit 12/16/2013  . Essential and other specified forms of tremor 12/16/2013  . Polyneuropathy in diabetes(357.2)    Past Surgical History  Procedure Laterality Date  . Anal fissure repair    . Colonoscopy w/ biopsies and polypectomy  8/03, 6/05, 7/09, 9/10    internal hemorrhoids, tubular adenomas, mucosa & lymphoid nodules  . Upper gastrointestinal endoscopy  3/05, 7/09, 9/10,2013    gastritis, duodenitis  . Total hip arthroplasty Right 11/13/2012    Procedure: TOTAL HIP ARTHROPLASTY ANTERIOR APPROACH;  Surgeon: Mcarthur Rossetti, MD;  Location: [facility];  Service: Orthopedics;  Laterality: Right;  Right total hip arthroplasty  . Ankle surgery Right     right- pins placed  in   Prior to Admission medications   Medication Sig Start Date End Date Taking? Authorizing Provider  acetaminophen (TYLENOL) 500 MG tablet Take 500 mg by mouth every 6 (six) hours as needed.   Yes Historical Provider, MD  albuterol (PROVENTIL) (2.5 MG/3ML) 0.083% nebulizer solution Take 3 mLs (2.5 mg total) by nebulization every 6 (six) hours as needed for wheezing or shortness of breath. 07/13/15  Yes [name], MD  budesonide-formoterol [facility]) 160-4.5 MCG/ACT inhaler Inhale 2 puffs into the lungs 2 (two) times daily.   Yes Historical Provider, MD  cloNIDine (CATAPRES) 0.1 MG tablet Take 1 tablet (0.1 mg total) by mouth 2 (two) times daily. 07/06/15  Yes Fritzi Mandes, MD  Docusate Calcium (STOOL SOFTENER PO) Take by mouth.   Yes Historical Provider, MD  feeding supplement, ENSURE ENLIVE, (ENSURE ENLIVE) LIQD Take 237 mLs by mouth 2 (two) times daily between meals. 07/06/15  Yes Fritzi Mandes, MD  Fluticasone Furoate-Vilanterol 100-25 MCG/INH AEPB Inhale 1 puff into the lungs daily. 07/13/15  Yes [name], MD  ipratropium (ATROVENT) 0.06 % nasal spray Place 2 sprays into the nose daily as needed.    Yes Historical Provider, MD  levothyroxine (SYNTHROID, LEVOTHROID) 50 MCG tablet Take 50 mcg by mouth daily before breakfast.   Yes Reynold Bowen, MD  losartan-hydrochlorothiazide (HYZAAR) 50-12.5 MG tablet Take 1 tablet by mouth daily. 07/13/15 07/12/16 Yes [name], MD  metoprolol tartrate (LOPRESSOR) 25 MG tablet Take  1 tablet (25 mg total) by mouth 2 (two) times daily. 07/06/15  Yes Fritzi Mandes, MD  pantoprazole (PROTONIX) 40 MG tablet Take 40 mg by mouth daily.   Yes Historical Provider, MD  saccharomyces boulardii (FLORASTOR) 250 MG capsule Take 250 mg by mouth daily.   Yes Historical Provider, MD  tiotropium (SPIRIVA) 18 MCG inhalation capsule Place 1 capsule (18 mcg total) into inhaler and inhale daily. 07/06/15  Yes Fritzi Mandes, MD  traMADol (ULTRAM) 50 MG tablet Take by  mouth at bedtime as needed.   Yes Historical Provider, MD  zolpidem (AMBIEN) 5 MG tablet Take 5 mg by mouth at bedtime as needed for sleep.   Yes Historical Provider, MD  amLODipine (NORVASC) 5 MG tablet Take 1 tablet (5 mg total) by mouth daily. Patient not taking: Reported on 07/15/2015 07/06/15   Fritzi Mandes, MD   Allergies  Allergen Reactions  . Fexofenadine Other (See Comments)  . Quinapril Hcl Other (See Comments)    Unknown   . Latex Rash     FAMILY HISTORY   Family History  Problem Relation Age of Onset  . Colon cancer Neg Hx   . Esophageal cancer Neg Hx   . Rectal cancer Neg Hx   . Stomach cancer Neg Hx   . Leukemia Maternal Aunt   . Thyroid cancer Daughter 39  . Diabetes Son       SOCIAL HISTORY    reports that he quit smoking about 52 years ago. His smoking use included Cigarettes. He has never used smokeless tobacco. He reports that he drinks alcohol. He reports that he does not use illicit drugs.  Review of Systems  Constitutional: Positive for malaise/fatigue. Negative for fever, chills and weight loss.  Eyes: Negative for blurred vision and double vision.  Respiratory: Positive for shortness of breath.   Gastrointestinal: Negative for heartburn and nausea.  Genitourinary: Negative for dysuria.  Skin: Negative for itching and rash.  Neurological: Positive for weakness. Negative for dizziness and headaches.  Endo/Heme/Allergies: Does not bruise/bleed easily.      VITAL SIGNS    Temp:  [97.8 F (36.6 C)-98.6 F (37 C)] 98.3 F (36.8 C) (10/24 0800) Pulse Rate:  [55-79] 73 (10/24 0700) Resp:  [11-25] 16 (10/24 0700) BP: (94-171)/(48-71) 140/55 mmHg (10/24 0700) SpO2:  [93 %-100 %] 97 % (10/24 0700) Weight:  [137 lb 12.6 oz (62.5 kg)] 137 lb 12.6 oz (62.5 kg) (10/24 0419) HEMODYNAMICS:   VENTILATOR SETTINGS:   INTAKE / OUTPUT:  Intake/Output Summary (Last 24 hours) at 07/20/15 0824 Last data filed at 07/20/15 0700  Gross per 24 hour  Intake   877.5 ml  Output    950 ml  Net  -72.5 ml       PHYSICAL EXAM   Physical Exam  Constitutional: He is oriented to person, place, and time.  HENT:  Head: Normocephalic and atraumatic.  Right Ear: External ear normal.  Left Ear: External ear normal.  Eyes: EOM are normal. Pupils are equal, round, and reactive to light.  Neck: Normal range of motion. Neck supple.  Cardiovascular: Normal rate, regular rhythm and normal heart sounds.   Pulmonary/Chest: He has no wheezes. He has no rales.  On BiPAP No use of accessory muscles Mild shallow breath sounds at bases  Abdominal: Soft. He exhibits no distension. There is no tenderness.  Neurological: He is alert and oriented to person, place, and time.  Skin: Skin is warm.  Nursing note and vitals reviewed.  LABS   LABS:  CBC  Recent Labs Lab 07/16/15 1241  WBC 6.3  HGB 13.7  HCT 41.2  PLT 304   Coag's No results for input(s): APTT, INR in the last 168 hours. BMET  Recent Labs Lab 07/17/15 0429 07/17/15 1347 07/18/15 0537  07/19/15 1232 07/19/15 1821 07/19/15 2356  NA 126* 122* 121*  < > 122* 125* 126*  K 3.6 3.9 3.7  --   --   --   --   CL 84* 84* 84*  --   --   --   --   CO2 35* 30 31  --   --   --   --   BUN '8 9 10  '$ --   --   --   --   CREATININE 0.69 0.76 0.72  --   --   --   --   GLUCOSE 112* 140* 128*  --   --   --   --   < > = values in this interval not displayed. Electrolytes  Recent Labs Lab 07/17/15 0429 07/17/15 1347 07/18/15 0537 07/19/15 1232  CALCIUM 8.5* 8.1* 7.8*  --   MG  --   --   --  1.4*   Sepsis Markers No results for input(s): LATICACIDVEN, PROCALCITON, O2SATVEN in the last 168 hours. ABG  Recent Labs Lab 07/19/15 1220  PHART 7.29*  PCO2ART 77*  PO2ART 58*   Liver Enzymes No results for input(s): AST, ALT, ALKPHOS, BILITOT, ALBUMIN in the last 168 hours. Cardiac Enzymes No results for input(s): TROPONINI, PROBNP in the last 168 hours. Glucose No results for  input(s): GLUCAP in the last 168 hours.   Recent Results (from the past 240 hour(s))  MRSA PCR Screening     Status: None   Collection Time: 07/19/15 12:24 PM  Result Value Ref Range Status   MRSA by PCR NEGATIVE NEGATIVE Final    Comment:        The GeneXpert MRSA Assay (FDA approved for NASAL specimens only), is one component of a comprehensive MRSA colonization surveillance program. It is not intended to diagnose MRSA infection nor to guide or monitor treatment for MRSA infections.      Current facility-administered medications:  .  0.9 %  sodium chloride infusion, 250 mL, Intravenous, PRN, Srikar Sudini, MD .  0.9 %  sodium chloride infusion, , Intravenous, Continuous, Lytle Butte, MD, Last Rate: 125 mL/hr at 07/20/15 0700 .  acetaminophen (TYLENOL) tablet 650 mg, 650 mg, Oral, Q6H PRN **OR** acetaminophen (TYLENOL) suppository 650 mg, 650 mg, Rectal, Q6H PRN, Hillary Bow, MD .  acidophilus (RISAQUAD) capsule 1 capsule, 1 capsule, Oral, Daily, Hillary Bow, MD, 1 capsule at 07/19/15 0828 .  albuterol (PROVENTIL) (2.5 MG/3ML) 0.083% nebulizer solution 2.5 mg, 2.5 mg, Nebulization, Q2H PRN, Srikar Sudini, MD .  aspirin EC tablet 81 mg, 81 mg, Oral, Daily, Hillary Bow, MD, 81 mg at 07/19/15 0827 .  budesonide-formoterol (SYMBICORT) 160-4.5 MCG/ACT inhaler 2 puff, 2 puff, Inhalation, BID, Hillary Bow, MD, 2 puff at 07/19/15 2000 .  enoxaparin (LOVENOX) injection 40 mg, 40 mg, Subcutaneous, Q24H, Srikar Sudini, MD, 40 mg at 07/19/15 2041 .  feeding supplement (ENSURE ENLIVE) (ENSURE ENLIVE) liquid 237 mL, 237 mL, Oral, BID BM, Srikar Sudini, MD, 237 mL at 07/19/15 1006 .  furosemide (LASIX) tablet 20 mg, 20 mg, Oral, Daily, Srikar Sudini, MD, 20 mg at 07/19/15 0826 .  hydrALAZINE (APRESOLINE) injection 10 mg, 10 mg, Intravenous, Q6H PRN, Srikar  Sudini, MD .  HYDROcodone-acetaminophen (NORCO/VICODIN) 5-325 MG per tablet 1-2 tablet, 1-2 tablet, Oral, Q4H PRN, Hillary Bow,  MD .  levothyroxine (SYNTHROID, LEVOTHROID) tablet 50 mcg, 50 mcg, Oral, QAC breakfast, Hillary Bow, MD, 50 mcg at 07/20/15 0545 .  losartan (COZAAR) tablet 50 mg, 50 mg, Oral, Daily, Hillary Bow, MD, 50 mg at 07/19/15 0826 .  magnesium oxide (MAG-OX) tablet 400 mg, 400 mg, Oral, Daily, Srikar Sudini, MD .  metoprolol tartrate (LOPRESSOR) tablet 25 mg, 25 mg, Oral, BID, Hillary Bow, MD, 25 mg at 07/19/15 0827 .  ondansetron (ZOFRAN) tablet 4 mg, 4 mg, Oral, Q6H PRN **OR** ondansetron (ZOFRAN) injection 4 mg, 4 mg, Intravenous, Q6H PRN, Srikar Sudini, MD .  pantoprazole (PROTONIX) EC tablet 40 mg, 40 mg, Oral, QAC breakfast, Hillary Bow, MD, 40 mg at 07/19/15 0825 .  polyethylene glycol (MIRALAX / GLYCOLAX) packet 17 g, 17 g, Oral, Daily PRN, Hillary Bow, MD, 17 g at 07/19/15 0837 .  sodium chloride (hypertonic) 3 % solution, , Intravenous, Continuous, Lytle Butte, MD, Stopped at 07/20/15 0215 .  sodium chloride 0.9 % injection 3 mL, 3 mL, Intravenous, Q12H, Hillary Bow, MD, 3 mL at 07/19/15 6629 .  sodium chloride 0.9 % injection 3 mL, 3 mL, Intravenous, PRN, Hillary Bow, MD .  tamsulosin (FLOMAX) capsule 0.4 mg, 0.4 mg, Oral, Daily, Harmeet Singh, MD, 0.4 mg at 07/19/15 0826 .  tiotropium (SPIRIVA) inhalation capsule 18 mcg, 18 mcg, Inhalation, Daily, Hillary Bow, MD, 18 mcg at 07/19/15 0830 .  traMADol (ULTRAM) tablet 50 mg, 50 mg, Oral, Q6H PRN, Hillary Bow, MD  IMAGING    Dg Chest Port 1 View  07/20/2015  CLINICAL DATA:  PICC placement.  Initial encounter. EXAM: PORTABLE CHEST 1 VIEW COMPARISON:  Chest radiograph performed 07/16/2015 FINDINGS: The right PICC is coiled within the patient's proximal right arm. Scattered associated soft tissue air is noted. This is likely coiled within the soft tissues. The lungs are well-aerated. Minimal left basilar atelectasis is noted. There is no evidence of pleural effusion or pneumothorax. The cardiomediastinal silhouette is borderline  normal in size. No acute osseous abnormalities are seen. IMPRESSION: 1. Right PICC coiled within the patient's proximal right arm, with scattered associated soft tissue air. This is likely coiled within the soft tissues, and should be removed. 2. Minimal left basilar atelectasis noted.  Lungs otherwise clear. These results were called by telephone at the time of interpretation on 07/20/2015 at 12:50 am to Nursing at the [facility] ICU, who verbally acknowledged these results. Electronically Signed   By: Garald Balding M.D.   On: 07/20/2015 00:53        ASSESSMENT/PLAN  81 year old male admitted for hyponatremia, recent admission for hyponatremia, sodium was correcting as an outpatient, but started to decline and was readmitted, today had an acute hypoxic hypercarbic event and had to be placed on BiPAP and transferred to the ICU, now in stable condition. Patient with acute COPD exacerbation from acute hyponatremia with encephlopathy  Pulmonary Acute on chronic hypercarbic respiratory distress - he is currently on BiPAP and tolerating it well. Plan for BiPAP 2 hours in the morning, 9 AM-11 AM, and 3 PM-5 PM, and at night minimum 4 hours with sleep; this is only for inpatient use -Patient has an outpatient sleep study scheduled in one week -Respiratory failure, hypoxic and hypercapnic, acute-resolving -Continue with BiPAP as tolerated, minimal 4 hours at night, may take breaks, may use 2 hours in the morning and 2 hours  in the afternoon. -ABG as needed  RUL nodule - possible primary lung cancer. We'll consider ENB, bronchoscopy when clinically stable.  -Oncology has been consulted, he supposed to have a PET CT sometime this week -Hypercapnic respiratory failure, with hypoventilation, this appears to be chronic going back to May 2015. Suspect this is secondary to chronic hypoventilation from COPD, though neurological disease could also be a cause. -Continue with duonebs -Will follow up  with Dr. 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 -Currently on 3% normal saline, current sodium at 122>>126  GASTROINTESTINAL Hx of IBS No acute issues at this time. We'll continue H2 blocker.  HEMATOLOGIC A: RUL spiculated lung lesion - 1.5cm P:  - workup in the future, with biopsy (possibly ENB bronchoscopy when the patient is stable.) -Oncology consult noted.  NEUROLOGIC -follows commands    I have personally obtained a history, examined the patient, evaluated Pertinent laboratory and Radio[employer]/imaging results, and  formulated the assessment and plan The Patient requires high complexity decision making for assessment and support, frequent evaluation and titration of therapies, application of advanced monitoring technologies and extensive interpretation of multiple databases. Critical Care Time devoted to patient care services described in this note is 45 minutes.  Overall, patient is critically ill, prognosis is guarded.     Corrin Parker, M.D.  [facility]  Medical Director [place] Director [facility] Cardio-Pulmonary Department

## 2015-07-20 NOTE — Consult Note (Signed)
[place] @ [facility] [phone]  Fax:([phone]) [place]: May 01, 1930  MR#: [id number]  TLX#:[id number]  Patient Care Team: [name], MD as PCP - General (Internal Medicine) [name], MD as Referring Physician (Internal Medicine)  CHIEF COMPLAINT:  Chief Complaint  Patient presents with  . abnormal labs    patient was admitted in the hospital with hyponatremia, previous admission due to respiratory failure.  Patient was intubated at that time.  CT scan of the chest during the last admission revealed right upper lobe lung mass.  Recent CT scan done today shows complete resolution of the right upper lobe lung mass.  There is right lower lobe consolidation. I was asked to evaluate patient for further evaluation.  VISIT DIAGNOSIS:     ICD-9-CM ICD-10-CM   1. Hyponatremia 276.1 E87.1   2. Somnolence 780.09 R40.0   3. Urinary frequency 788.41 R35.0   4. Hyperglycemia 790.29 R73.9   5. Lung mass 786.6 R91.8 DG Chest 2 View     DG Chest 2 View  6. Status post PICC central line placement V45.89 Z95.828 DG Chest [facility] 1 View     DG Chest [place] 1 View  7. Respiratory failure (HCC) 518.81 J96.90 CT Chest Wo Contrast     CT Chest Wo Contrast      No history exists.    Oncology Flowsheet 07/04/2015 07/05/2015 07/16/2015 07/17/2015 07/18/2015 07/19/2015 07/20/2015  enoxaparin (LOVENOX) [place] 40 _0  mg 40 mg  methylPREDNISolone sodium succinate 125 mg/2 mL (SOLU-MEDROL) IV - - - - - - -  ondansetron (ZOFRAN) IJ - - - - - - -  ondansetron (ZOFRAN) IV - - - - - - -  predniSONE (DELTASONE) PO - - - - - - -      REVIEW OF SYSTEMS:   Gen. status: Patient is lying in the bed very poor historian.  Says he is feeling weak and tired.  Has increasing shortness of breath increasing cough.  Poor appetite. Is and was brought back with hyponatremia Lower extremity swelling Neurological system no headache no  dizziness Skin: No rash GI: Poor appetite.  No nausea no vomiting Lungs: Increasing shortness of breath All other systems have been reviewed  As per HPI. Otherwise, a complete review of systems is negatve.  PAST MEDICAL HISTORY: Past Medical History  Diagnosis Date  . COPD with asthma ([place])   . Diabetes mellitus     Diet control   . Hyperlipidemia   . Carpal tunnel syndrome   . DJD (degenerative joint disease), cervical   . Vitamin D deficiency   . Tremor, essential   . Internal hemorrhoids   . Personal history of colonic polyps     adenomatous  . IBS (irritable bowel syndrome)   . Peptic ulcer   . Duodenal ulcer, with partial obstruction 08/10/2012  . Numbness and tingling in right hand     started 2 yeas ago  . Hypertension     no medicine needed  . Memory deficit 12/16/2013  . Essential and other specified forms of tremor 12/16/2013  . Polyneuropathy in diabetes(357.2)     PAST SURGICAL HISTORY: Past Surgical History  Procedure Laterality Date  . Anal fissure repair    . Colonoscopy w/ biopsies and polypectomy  8/03, 6/05, 7/09, 9/10    internal hemorrhoids, tubular adenomas, mucosa & lymphoid nodules  . Upper gastrointestinal endoscopy  3/05, 7/09, 9/10,2013  gastritis, duodenitis  . Total hip arthroplasty Right 11/13/2012    Procedure: TOTAL HIP ARTHROPLASTY ANTERIOR APPROACH;  Surgeon: Mcarthur Rossetti, MD;  Location: [place];  Service: Orthopedics;  Laterality: Right;  Right total hip arthroplasty  . Ankle surgery Right     right- pins placed in    FAMILY HISTORY Family History  Problem Relation Age of Onset  . Colon cancer Neg Hx   . Esophageal cancer Neg Hx   . Rectal cancer Neg Hx   . Stomach cancer Neg Hx   . Leukemia Maternal Aunt   . Thyroid cancer Daughter 15  . Diabetes Son     GYNECOLOGIC HISTORY:  No LMP for male patient.     ADVANCED DIRECTIVES:  Patient does not have any living will or healthcare power of attorney.  Information was  given .  Available resources had been discussed.  We will follow-up on subsequent appointments regarding this issue  HEALTH MAINTENANCE: Social History  Substance Use Topics  . Smoking status: Former Smoker    Types: Cigarettes    Quit date: 07/02/1963  . Smokeless tobacco: Never Used  . Alcohol Use: 0.0 oz/week    0 Standard drinks or equivalent per week     Comment: occasional    :  Allergies  Allergen Reactions  . Fexofenadine Other (See Comments)  . Quinapril Hcl Other (See Comments)    Unknown   . Latex Rash    Current Facility-Administered Medications  Medication Dose Route Frequency Provider Last Rate Last Dose  . 0.9 %  sodium chloride infusion  250 mL Intravenous PRN Srikar Sudini, MD      . 0.9 %  sodium chloride infusion   Intravenous Continuous Flora Lipps, MD 125 mL/hr at 07/20/15 1136    . acetaminophen (TYLENOL) tablet 650 mg  650 mg Oral Q6H PRN Hillary Bow, MD       Or  . acetaminophen (TYLENOL) suppository 650 mg  650 mg Rectal Q6H PRN Srikar Sudini, MD      . acidophilus (RISAQUAD) capsule 1 capsule  1 capsule Oral Daily Hillary Bow, MD   1 capsule at 07/20/15 0952  . albuterol (PROVENTIL) (2.5 MG/3ML) 0.083% nebulizer solution 2.5 mg  2.5 mg Nebulization Q2H PRN Hillary Bow, MD      . aspirin EC tablet 81 mg  81 mg Oral Daily Hillary Bow, MD   81 mg at 07/20/15 0951  . budesonide-formoterol (SYMBICORT) 160-4.5 MCG/ACT inhaler 2 puff  2 puff Inhalation BID Hillary Bow, MD   2 puff at 07/20/15 1909  . enoxaparin (LOVENOX) injection 40 mg  40 mg Subcutaneous Q24H Hillary Bow, MD   40 mg at 07/20/15 2104  . feeding supplement (ENSURE ENLIVE) (ENSURE ENLIVE) liquid 237 mL  237 mL Oral BID BM Srikar Sudini, MD   237 mL at 07/20/15 1403  . furosemide (LASIX) tablet 20 mg  20 mg Oral Daily Hillary Bow, MD   20 mg at 07/20/15 0952  . hydrALAZINE (APRESOLINE) injection 10 mg  10 mg Intravenous Q6H PRN Srikar Sudini, MD      . HYDROcodone-acetaminophen  (NORCO/VICODIN) 5-325 MG per tablet 1-2 tablet  1-2 tablet Oral Q4H PRN Srikar Sudini, MD      . levothyroxine (SYNTHROID, LEVOTHROID) tablet 50 mcg  50 mcg Oral QAC breakfast Hillary Bow, MD   50 mcg at 07/20/15 0545  . losartan (COZAAR) tablet 50 mg  50 mg Oral Daily Hillary Bow, MD   50 mg at 07/20/15  8527  . magnesium oxide (MAG-OX) tablet 400 mg  400 mg Oral Daily Hillary Bow, MD   400 mg at 07/20/15 0952  . metoprolol tartrate (LOPRESSOR) tablet 25 mg  25 mg Oral BID Hillary Bow, MD   25 mg at 07/20/15 2103  . ondansetron (ZOFRAN) tablet 4 mg  4 mg Oral Q6H PRN Hillary Bow, MD       Or  . ondansetron (ZOFRAN) injection 4 mg  4 mg Intravenous Q6H PRN Srikar Sudini, MD      . pantoprazole (PROTONIX) EC tablet 40 mg  40 mg Oral QAC breakfast Hillary Bow, MD   40 mg at 07/20/15 0951  . polyethylene glycol (MIRALAX / GLYCOLAX) packet 17 g  17 g Oral Daily PRN Hillary Bow, MD   17 g at 07/19/15 0837  . sodium chloride 0.9 % injection 3 mL  3 mL Intravenous Q12H Srikar Sudini, MD   3 mL at 07/20/15 1404  . sodium chloride 0.9 % injection 3 mL  3 mL Intravenous PRN Srikar Sudini, MD      . tamsulosin (FLOMAX) capsule 0.4 mg  0.4 mg Oral Daily Harmeet Singh, MD   0.4 mg at 07/20/15 0951  . tiotropium (SPIRIVA) inhalation capsule 18 mcg  18 mcg Inhalation Daily Hillary Bow, MD   18 mcg at 07/20/15 1402  . traMADol (ULTRAM) tablet 50 mg  50 mg Oral Q6H PRN Hillary Bow, MD        OBJECTIVE: PHYSICAL EXAM: Patient is lying in the bed.  Not any acute distress Lungs: Emphysematous chest crepitation and right base Cardiac: Tachycardia Abdomen: Soft liver and spleen not palpable Lower extremity no edema Skin: No rash Neurological system difficult to examine in  Filed Vitals:   07/20/15 2103  BP: 129/59  Pulse: 79  Temp:   Resp:      Body mass index is 23.64 kg/(m^2).    ECOG FS:2 - Symptomatic, <50% confined to bed  LAB RESULTS:  Admission on 07/16/2015  Component Date  Value Ref Range Status  . Sodium 07/16/2015 118* 135 - 145 mmol/L Final   Comment: CRITICAL RESULT CALLED TO, READ BACK BY AND VERIFIED WITH  MARY NEEDHAM AT 1406 07/16/15 WDM   . Potassium 07/16/2015 4.1  3.5 - 5.1 mmol/L Final   HEMOLYSIS AT THIS LEVEL MAY AFFECT RESULT  . Chloride 07/16/2015 77* 101 - 111 mmol/L Final  . CO2 07/16/2015 28  22 - 32 mmol/L Final  . Glucose, Bld 07/16/2015 159* 65 - 99 mg/dL Final  . BUN 07/16/2015 8  6 - 20 mg/dL Final  . Creatinine, Ser 07/16/2015 0.78  0.61 - 1.24 mg/dL Final  . Calcium 07/16/2015 8.5* 8.9 - 10.3 mg/dL Final  . GFR calc non Af Amer 07/16/2015 >60  >60 mL/min Final  . GFR calc Af Amer 07/16/2015 >60  >60 mL/min Final   Comment: (NOTE) The eGFR has been calculated using the CKD EPI equation. This calculation has not been validated in all clinical situations. eGFR's persistently <60 mL/min signify possible Chronic Kidney Disease.   . Anion gap 07/16/2015 13  5 - 15 Final  . WBC 07/16/2015 6.3  3.8 - 10.6 K/uL Final  . RBC 07/16/2015 4.43  4.40 - 5.90 MIL/uL Final  . Hemoglobin 07/16/2015 13.7  13.0 - 18.0 g/dL Final  . HCT 07/16/2015 41.2  40.0 - 52.0 % Final  . MCV 07/16/2015 93.1  80.0 - 100.0 fL Final  . MCH 07/16/2015 30.9  26.0 -  34.0 pg Final  . MCHC 07/16/2015 33.2  32.0 - 36.0 g/dL Final  . RDW 07/16/2015 12.8  11.5 - 14.5 % Final  . Platelets 07/16/2015 304  150 - 440 K/uL Final  . Color, Urine 07/16/2015 STRAW* YELLOW Final  . APPearance 07/16/2015 CLEAR* CLEAR Final  . Glucose, UA 07/16/2015 NEGATIVE  NEGATIVE mg/dL Final  . Bilirubin Urine 07/16/2015 NEGATIVE  NEGATIVE Final  . Ketones, ur 07/16/2015 NEGATIVE  NEGATIVE mg/dL Final  . Specific Gravity, Urine 07/16/2015 1.008  1.005 - 1.030 Final  . Hgb urine dipstick 07/16/2015 NEGATIVE  NEGATIVE Final  . pH 07/16/2015 7.0  5.0 - 8.0 Final  . Protein, ur 07/16/2015 100* NEGATIVE mg/dL Final  . Nitrite 07/16/2015 NEGATIVE  NEGATIVE Final  . Leukocytes, UA 07/16/2015  NEGATIVE  NEGATIVE Final  . RBC / HPF 07/16/2015 0-5  0 - 5 RBC/hpf Final  . WBC, UA 07/16/2015 0-5  0 - 5 WBC/hpf Final  . Bacteria, UA 07/16/2015 NONE SEEN  NONE SEEN Final  . Squamous Epithelial / LPF 07/16/2015 0-5* NONE SEEN Final  . Mucous 07/16/2015 PRESENT   Final  . Creatinine, Urine 07/16/2015 43   Final  . Osmolality, Ur 07/16/2015 294* 300 - 900 mOsm/kg Final  . Sodium, Ur 07/16/2015 90   Final  . Sodium 07/17/2015 126* 135 - 145 mmol/L Final  . Potassium 07/17/2015 3.6  3.5 - 5.1 mmol/L Final  . Chloride 07/17/2015 84* 101 - 111 mmol/L Final  . CO2 07/17/2015 35* 22 - 32 mmol/L Final  . Glucose, Bld 07/17/2015 112* 65 - 99 mg/dL Final  . BUN 07/17/2015 8  6 - 20 mg/dL Final  . Creatinine, Ser 07/17/2015 0.69  0.61 - 1.24 mg/dL Final  . Calcium 07/17/2015 8.5* 8.9 - 10.3 mg/dL Final  . GFR calc non Af Amer 07/17/2015 >60  >60 mL/min Final  . GFR calc Af Amer 07/17/2015 >60  >60 mL/min Final   Comment: (NOTE) The eGFR has been calculated using the CKD EPI equation. This calculation has not been validated in all clinical situations. eGFR's persistently <60 mL/min signify possible Chronic Kidney Disease.   . Anion gap 07/17/2015 7  5 - 15 Final  . Sodium 07/17/2015 122* 135 - 145 mmol/L Final  . Potassium 07/17/2015 3.9  3.5 - 5.1 mmol/L Final  . Chloride 07/17/2015 84* 101 - 111 mmol/L Final  . CO2 07/17/2015 30  22 - 32 mmol/L Final  . Glucose, Bld 07/17/2015 140* 65 - 99 mg/dL Final  . BUN 07/17/2015 9  6 - 20 mg/dL Final  . Creatinine, Ser 07/17/2015 0.76  0.61 - 1.24 mg/dL Final  . Calcium 07/17/2015 8.1* 8.9 - 10.3 mg/dL Final  . GFR calc non Af Amer 07/17/2015 >60  >60 mL/min Final  . GFR calc Af Amer 07/17/2015 >60  >60 mL/min Final   Comment: (NOTE) The eGFR has been calculated using the CKD EPI equation. This calculation has not been validated in all clinical situations. eGFR's persistently <60 mL/min signify possible Chronic Kidney Disease.   . Anion gap  07/17/2015 8  5 - 15 Final  . Sodium 07/18/2015 121* 135 - 145 mmol/L Final  . Potassium 07/18/2015 3.7  3.5 - 5.1 mmol/L Final  . Chloride 07/18/2015 84* 101 - 111 mmol/L Final  . CO2 07/18/2015 31  22 - 32 mmol/L Final  . Glucose, Bld 07/18/2015 128* 65 - 99 mg/dL Final  . BUN 07/18/2015 10  6 - 20 mg/dL Final  .  Creatinine, Ser 07/18/2015 0.72  0.61 - 1.24 mg/dL Final  . Calcium 07/18/2015 7.8* 8.9 - 10.3 mg/dL Final  . GFR calc non Af Amer 07/18/2015 >60  >60 mL/min Final  . GFR calc Af Amer 07/18/2015 >60  >60 mL/min Final   Comment: (NOTE) The eGFR has been calculated using the CKD EPI equation. This calculation has not been validated in all clinical situations. eGFR's persistently <60 mL/min signify possible Chronic Kidney Disease.   . Anion gap 07/18/2015 6  5 - 15 Final  . Sodium 07/19/2015 121* 135 - 145 mmol/L Final  . Sodium 07/19/2015 122* 135 - 145 mmol/L Final  . Sodium 07/19/2015 125* 135 - 145 mmol/L Final  . Magnesium 07/19/2015 1.4* 1.7 - 2.4 mg/dL Final  . Osmolality, Ur 07/19/2015 465  300 - 900 mOsm/kg Final  . Chloride Urine 07/19/2015 45   Final  . Sodium 07/19/2015 126* 135 - 145 mmol/L Final  . FIO2 07/19/2015 0.21   Final  . Delivery systems 07/19/2015 PENDING   Incomplete  . Inspiratory PAP 07/19/2015 PENDING   Incomplete  . Expiratory PAP 07/19/2015 PENDING   Incomplete  . pH, Arterial 07/19/2015 7.29* 7.350 - 7.450 Final  . pCO2 arterial 07/19/2015 77* 32.0 - 48.0 mmHg Final   Comment: CRITICAL RESULT CALLED TO, READ BACK BY AND VERIFIED WITH: CRITICAL RESULTS DR. SUDINI,07/19/15 1231   . pO2, Arterial 07/19/2015 58* 83.0 - 108.0 mmHg Final  . Bicarbonate 07/19/2015 37.0* 21.0 - 28.0 mEq/L Final  . Acid-Base Excess 07/19/2015 8.1* 0.0 - 3.0 mmol/L Final  . O2 Saturation 07/19/2015 86.3   Final  . Patient temperature 07/19/2015 37.0   Final  . Oxygen index 07/19/2015 PENDING   Incomplete  . Collection site 07/19/2015 PENDING   Incomplete  . Drawn  by 07/19/2015 RIGHT RADIAL   Final  . Sample type 07/19/2015 ARTERIAL DRAW   Final  . Allens test (pass/fail) 07/19/2015 POSITIVE* PASS Final  . MRSA by PCR 07/19/2015 NEGATIVE  NEGATIVE Final   Comment:        The GeneXpert MRSA Assay (FDA approved for NASAL specimens only), is one component of a comprehensive MRSA colonization surveillance program. It is not intended to diagnose MRSA infection nor to guide or monitor treatment for MRSA infections.   . Osmolality 07/20/2015 265* 275 - 295 mOsm/kg Final  . FIO2 07/20/2015 0.24   Final  . Mode 07/20/2015 BIPAP   Final  . Inspiratory PAP 07/20/2015 10   Final  . Expiratory PAP 07/20/2015 5   Final  . pH, Arterial 07/20/2015 7.34* 7.350 - 7.450 Final  . pCO2 arterial 07/20/2015 67* 32.0 - 48.0 mmHg Final  . pO2, Arterial 07/20/2015 87  83.0 - 108.0 mmHg Final  . Bicarbonate 07/20/2015 36.1* 21.0 - 28.0 mEq/L Final  . Acid-Base Excess 07/20/2015 7.8* 0.0 - 3.0 mmol/L Final  . O2 Saturation 07/20/2015 96.0   Final  . Patient temperature 07/20/2015 37.0   Final  . Collection site 07/20/2015 RIGHT RADIAL   Final  . Sample type 07/20/2015 ARTERIAL DRAW   Final  . Allens test (pass/fail) 07/20/2015 PASS  PASS Final  . Sodium 07/20/2015 130* 135 - 145 mmol/L Final  . Sodium 07/20/2015 129* 135 - 145 mmol/L Final  . Sodium 07/20/2015 128* 135 - 145 mmol/L Final  . Color, Urine 07/20/2015 STRAW* YELLOW Final  . APPearance 07/20/2015 CLEAR* CLEAR Final  . Glucose, UA 07/20/2015 NEGATIVE  NEGATIVE mg/dL Final  . Bilirubin Urine 07/20/2015 NEGATIVE  NEGATIVE Final  . Ketones, ur 07/20/2015 NEGATIVE  NEGATIVE mg/dL Final  . Specific Gravity, Urine 07/20/2015 1.010  1.005 - 1.030 Final  . Hgb urine dipstick 07/20/2015 NEGATIVE  NEGATIVE Final  . pH 07/20/2015 5.0  5.0 - 8.0 Final  . Protein, ur 07/20/2015 NEGATIVE  NEGATIVE mg/dL Final  . Nitrite 07/20/2015 NEGATIVE  NEGATIVE Final  . Leukocytes, UA 07/20/2015 NEGATIVE  NEGATIVE Final  .  RBC / HPF 07/20/2015 0-5  0 - 5 RBC/hpf Final  . WBC, UA 07/20/2015 0-5  0 - 5 WBC/hpf Final  . Bacteria, UA 07/20/2015 RARE* NONE SEEN Final  . Squamous Epithelial / LPF 07/20/2015 NONE SEEN  NONE SEEN Final  . Mucous 07/20/2015 PRESENT   Final  Lab on 07/16/2015  Component Date Value Ref Range Status  . Sodium 07/16/2015 122* 135 - 145 mEq/L Final     STUDIES: Dg Chest 1 View  07/02/2015  CLINICAL DATA:  Community-acquired pneumonia, dyspnea, history of COPD -asthma. EXAM: CHEST 1 VIEW COMPARISON:  Portable chest x-ray of July 01, 2015 FINDINGS: There is interval extubation of the trachea and of the esophagus. The lungs are borderline hypoinflated. There is considerable gaseous distention of bowel loops under the hemidiaphragm. There is no significant pleural effusion. The cardiac silhouette is normal in size. The central pulmonary vascularity is mildly prominent though stable. The left internal jugular venous catheter tip projects over the proximal to midportion of the SVC. IMPRESSION: Interval extubation of the trachea. Mild hypoinflation. Prominent gaseous distention of bowel under the right hemidiaphragm and to a lesser extent on the left. There is no alveolar pneumonia. Minimal right basilar atelectasis is suspected. An abdominal series would be useful to further assess the status of the bowel and exclude occult free subdiaphragmatic gas. Electronically Signed   By: David  Martinique M.D.   On: 07/02/2015 07:29   Dg Chest 1 View  07/01/2015  CLINICAL DATA:  Shortness of breath. EXAM: CHEST 1 VIEW COMPARISON:  06/29/2015.  CT 06/25/2015 FINDINGS: Endotracheal tube, NG tube, left IJ line stable position. Heart size normal. Right upper lobe nodular density is difficult to identify due to overlying EKG lead. No new focal pulmonary infiltrate. No pleural effusion or pneumothorax. No acute bony abnormality. IMPRESSION: 1. Lines and tubes in stable position. 2. Right upper lobe nodular density is  difficult to visualize due to overlying EKG lead. No new infiltrate. Reference is made to prior CT report 06/25/2015. Electronically Signed   By: Marcello Moores  Register   On: 07/01/2015 07:21   Dg Chest 1 View  06/29/2015  CLINICAL DATA:  On ventilator, shortness of breath. EXAM: CHEST 1 VIEW COMPARISON:  06/28/2015 and CT chest 06/25/2015. FINDINGS: Endotracheal tube terminates approximately 3 cm above the carina. Left IJ central line tip projects over the upper SVC. Nasogastric tube terminates in the stomach. Heart size normal. Mild bibasilar atelectasis. Nodular lesion in the right perihilar region, as on 06/25/2015. No pleural fluid. IMPRESSION: 1. Right upper lobe nodule, as on 06/25/2015. Follow-up CT chest without contrast is recommended in 3-4 weeks in further evaluation, as malignancy cannot be excluded. 2. Mild bibasilar subsegmental atelectasis. Electronically Signed   By: Lorin Picket M.D.   On: 06/29/2015 07:53   Dg Chest 1 View  06/28/2015  CLINICAL DATA:  Hypoxia EXAM: CHEST 1 VIEW COMPARISON:  June 26, 2015 chest radiograph and chest CT June 25, 2015 FINDINGS: Endotracheal tube tip is 3.4 cm above the carina. Central catheter tip is in the superior vena  cava. Nasogastric tube tip and side port are in the stomach. No pneumothorax. No edema or consolidation. The known nodular lesion in the right upper lobe seen on CT is not appreciable by radiography. Heart size and pulmonary vascularity are normal. No adenopathy. IMPRESSION: Tube and catheter positions as described without pneumothorax. No edema or consolidation. Right upper lobe nodular lesions seen on recent CT is not appreciable on this examination. Electronically Signed   By: Lowella Grip III M.D.   On: 06/28/2015 08:00   Dg Chest 1 View  06/26/2015  CLINICAL DATA:  Central line placement, intubation EXAM: CHEST 1 VIEW COMPARISON:  06/25/2015 chest radiograph and CT FINDINGS: Endotracheal tube is appropriately positioned. Left  IJ central line tip terminates over the mid SVC. Heart size normal. Lungs are grossly clear. Right upper lobe nodule is not as well seen as on the prior exam, possibly obscured by support apparatus. Nasogastric tube tip terminates below the level of the diaphragms but is not included in the field of view. IMPRESSION: No new acute finding.  Support apparatus as above. Electronically Signed   By: Conchita Paris M.D.   On: 06/26/2015 11:23   Dg Chest 2 View  07/16/2015  CLINICAL DATA:  Hyponatremia.  Right upper lobe mass.  COPD. EXAM: CHEST  2 VIEW COMPARISON:  07/02/2015 FINDINGS: Left jugular central venous catheter is been removed since previous study. Nodular density again seen in the right upper lobe, better visualized on recent chest CT of 06/25/2015. No evidence of pulmonary consolidation or edema. No evidence of pneumothorax or pleural effusion. IMPRESSION: No acute findings. Right upper lobe nodular density, better demonstrated on recent chest CT of 06/25/2015. Electronically Signed   By: Earle Gell M.D.   On: 07/16/2015 16:02   Dg Chest 2 View  06/25/2015  CLINICAL DATA:  Hyponatremia.  Weakness. EXAM: CHEST  2 VIEW COMPARISON:  06/01/2014 and 04/25/2012 FINDINGS: There is a vague nodular appearing density in the right upper lobe between the anterior aspects of the right second and third ribs. There is adjacent oxygen 2 being. This could that be artifactual. Lungs are otherwise clear. Heart size and vascularity are normal. Interposed bowel under the right hemidiaphragm, unchanged. No acute osseous abnormality. IMPRESSION: Vague density in the right upper lobe. This may be artifactual. I recommend repeat AP or PA chest x-ray prior to discharge. Electronically Signed   By: Lorriane Shire M.D.   On: 06/25/2015 16:27   Dg Abd 1 View  07/02/2015  CLINICAL DATA:  Abdominal pain and distention, ileus EXAM: ABDOMEN - 1 VIEW COMPARISON:  06/26/2015 FINDINGS: There is gaseous distension of both large and  small bowel loops no evidence for free intraperitoneal air on this supine view. No organomegaly or abnormal calcifications. Previous right hip arthroplasty. Degenerative changes in the hips and spine. IMPRESSION: Ileus bowel gas pattern. Electronically Signed   By: Nolon Nations M.D.   On: 07/02/2015 09:20   Dg Abd 1 View  06/26/2015  CLINICAL DATA:  Evaluate OG tube placement. EXAM: ABDOMEN - 1 VIEW COMPARISON:  06/04/2014 FINDINGS: The OG tube tip is in the stomach. Side port is below the GE junction. No dilated loops of bowel identified. Gas and stool noted within the colon up to the rectum. IMPRESSION: 1. OG tube tip is in the stomach. 2. Nonobstructive bowel gas pattern. Electronically Signed   By: Kerby Moors M.D.   On: 06/26/2015 11:49   Ct Head Wo Contrast  06/25/2015  CLINICAL DATA:  Altered  mental status.  Weakness.  Hyponatremia. EXAM: CT HEAD WITHOUT CONTRAST TECHNIQUE: Contiguous axial images were obtained from the base of the skull through the vertex without intravenous contrast. COMPARISON:  12/02/2008 head CT.  12/21/2013 brain MRI. FINDINGS: No evidence of parenchymal hemorrhage or extra-axial fluid collection. No mass lesion, mass effect, or midline shift. No CT evidence of acute infarction. Stable generalized cerebral volume loss. Cerebral ventricle size is are stable and concordant with the degree of cerebral volume loss. Atherosclerotic intracranial arteries. Nonspecific subcortical and periventricular white matter hypodensity, most in keeping with chronic small vessel ischemic change. The visualized paranasal sinuses are essentially clear. The mastoid air cells are unopacified. No evidence of calvarial fracture. IMPRESSION: 1. No acute intracranial abnormality. 2. Generalized cerebral volume loss and chronic small vessel ischemic change. Electronically Signed   By: Ilona Sorrel M.D.   On: 06/25/2015 17:52   Ct Chest Wo Contrast  07/20/2015  CLINICAL DATA:  Subsequent encounter  for hyponatremia and COPD exacerbation. EXAM: CT CHEST WITHOUT CONTRAST TECHNIQUE: Multidetector CT imaging of the chest was performed following the standard protocol without IV contrast. COMPARISON:  06/25/2015. FINDINGS: Mediastinum / Lymph Nodes: There is no axillary lymphadenopathy. No mediastinal lymphadenopathy. There is no hilar lymphadenopathy. Heart is borderline enlarged. Coronary artery calcification is noted. Lungs / Pleura: 1.5 cm spiculated lesion in the right upper lobe previously has decreased substantially in the interval and has nearly resolved. Other patchy nodularity in the posterior right upper lobe has improved in the interval. Interval development of compressive atelectasis in the dependent lung bases with small bilateral pleural effusions. Superimposed infection in the right base cannot be entirely excluded. MSK / Soft Tissues: Bone windows reveal no worrisome lytic or sclerotic osseous lesions. Gas is identified in the right axillary region and may be within venous anatomy or within the soft tissues. Upper Abdomen: Trace amount of free fluid is seen adjacent to the liver. There is some edema in the retroperitoneal fat, slightly progressed in the interval and nonspecific. IMPRESSION: 1. Interval near complete resolution of the dominant spiculated opacity in the right upper lobe with associated interval improvement in the adjacent patchy nodularity. 2. Interval development of dependent collapse/consolidation, right greater than left with small bilateral pleural effusions. 3. Soft tissue gas in the right axilla may be within venous anatomy. Correlation for venous access in the right upper extremity may prove helpful. Soft tissue gas from laceration or infection would also be a consideration. 4. Small volume perihepatic ascites. Electronically Signed   By: Misty Stanley M.D.   On: 07/20/2015 12:34   Ct Chest W Contrast  06/25/2015  CLINICAL DATA:  Abnormal chest x-ray EXAM: CT CHEST WITH  CONTRAST TECHNIQUE: Multidetector CT imaging of the chest was performed during intravenous contrast administration. CONTRAST:  68m OMNIPAQUE IOHEXOL 300 MG/ML  SOLN COMPARISON:  Plain film from earlier in the same day FINDINGS: The left lung is well aerated without focal infiltrate or sizable effusion. The right lung is also well aerated but demonstrates patchy changes in the posterior aspect of the right upper lobe. Some of these the appearance of early infiltrate although a more organized 15 mm mildly spiculated lesion is noted in the upper lobe best seen on image number 19 of series 3. No other focal lesions are noted. The thoracic inlet is within normal limits. Calcifications are noted in the thoracic aorta and its branches although no aneurysmal dilatation or dissection is seen. Heavy coronary calcifications are noted particularly in the left anterior descending  coronary artery. The pulmonary artery demonstrates a normal branching pattern without definitive filling defect. No significant lymphadenopathy is seen. Scanning into the upper abdomen reveals no acute abnormality. A left renal cyst is noted. The osseous structures are within normal limits. IMPRESSION: Somewhat spiculated mass lesion in the right upper lobe with some associated right upper lobe infiltrative changes. This must be viewed with a degree of suspicion for underlying neoplasm. Continued workup is recommended. This lesion would be amenable to percutaneous biopsy if clinically indicated. Electronically Signed   By: Inez Catalina M.D.   On: 06/25/2015 17:51   US Venous Img Lower Bilateral  07/13/2015  CLINICAL DATA:  Bilateral lower extremity edema. History of smoking. Evaluate for DVT. EXAM: BILATERAL LOWER EXTREMITY VENOUS DOPPLER ULTRASOUND TECHNIQUE: Gray-scale sonography with graded compression, as well as color Doppler and duplex ultrasound were performed to evaluate the lower extremity deep venous systems from the level of the common  femoral vein and including the common femoral, femoral, profunda femoral, popliteal and calf veins including the posterior tibial, peroneal and gastrocnemius veins when visible. The superficial great saphenous vein was also interrogated. Spectral Doppler was utilized to evaluate flow at rest and with distal augmentation maneuvers in the common femoral, femoral and popliteal veins. COMPARISON:  None. FINDINGS: RIGHT LOWER EXTREMITY Common Femoral Vein: No evidence of thrombus. Normal compressibility, respiratory phasicity and response to augmentation. Saphenofemoral Junction: No evidence of thrombus. Normal compressibility and flow on color Doppler imaging. Profunda Femoral Vein: No evidence of thrombus. Normal compressibility and flow on color Doppler imaging. Femoral Vein: No evidence of thrombus. Normal compressibility, respiratory phasicity and response to augmentation. Popliteal Vein: No evidence of thrombus. Normal compressibility, respiratory phasicity and response to augmentation. Calf Veins: No evidence of thrombus. Normal compressibility and flow on color Doppler imaging. Superficial Great Saphenous Vein: No evidence of thrombus. Normal compressibility and flow on color Doppler imaging. Venous Reflux:  None. Other Findings:  None. LEFT LOWER EXTREMITY Common Femoral Vein: No evidence of thrombus. Normal compressibility, respiratory phasicity and response to augmentation. Saphenofemoral Junction: No evidence of thrombus. Normal compressibility and flow on color Doppler imaging. Profunda Femoral Vein: No evidence of thrombus. Normal compressibility and flow on color Doppler imaging. Femoral Vein: No evidence of thrombus. Normal compressibility, respiratory phasicity and response to augmentation. Popliteal Vein: No evidence of thrombus. Normal compressibility, respiratory phasicity and response to augmentation. Calf Veins: No evidence of thrombus. Normal compressibility and flow on color Doppler imaging.  Superficial Great Saphenous Vein: No evidence of thrombus. Normal compressibility and flow on color Doppler imaging. Venous Reflux:  None. Other Findings:  None. IMPRESSION: No evidence of DVT within either lower extremity. Electronically Signed   By: Sandi Mariscal M.D.   On: 07/13/2015 12:41   Dg Chest Port 1 View  07/20/2015  CLINICAL DATA:  PICC placement.  Initial encounter. EXAM: PORTABLE CHEST 1 VIEW COMPARISON:  Chest radiograph performed 07/16/2015 FINDINGS: The right PICC is coiled within the patient's proximal right arm. Scattered associated soft tissue air is noted. This is likely coiled within the soft tissues. The lungs are well-aerated. Minimal left basilar atelectasis is noted. There is no evidence of pleural effusion or pneumothorax. The cardiomediastinal silhouette is borderline normal in size. No acute osseous abnormalities are seen. IMPRESSION: 1. Right PICC coiled within the patient's proximal right arm, with scattered associated soft tissue air. This is likely coiled within the soft tissues, and should be removed. 2. Minimal left basilar atelectasis noted.  Lungs otherwise clear. These results were  called by telephone at the time of interpretation on 07/20/2015 at 12:50 am to Nursing at the [facility] ICU, who verbally acknowledged these results. Electronically Signed   By: Garald Balding M.D.   On: 07/20/2015 00:53     ASSESSMENT: Hyponatremia  etiology is not clear possibility of congestive heart failure possibility of paraneoplastic syndrome A right upper upper lobe lung mass is resolved.  There is right lower lobe consolidation. If this is paraneoplastic syndrome and possibility of other malignancy cannot be ruled out It may be prudent to proceed with PET scan being I discussed that with Dr. Mortimer Fries and he is in agreement with it I will discuss that with the family  PLAN:   PET scan  Patient expressed understanding and was in agreement with this plan. He also understands  that He can call clinic at any time with any questions, concerns, or complaints.    No matching staging information was found for the patient.  Forest Gleason, MD   07/20/2015 10:14 PM

## 2015-07-21 DIAGNOSIS — Z7982 Long term (current) use of aspirin: Secondary | ICD-10-CM

## 2015-07-21 DIAGNOSIS — E119 Type 2 diabetes mellitus without complications: Secondary | ICD-10-CM

## 2015-07-21 DIAGNOSIS — F5089 Other specified eating disorder: Secondary | ICD-10-CM

## 2015-07-21 DIAGNOSIS — R911 Solitary pulmonary nodule: Secondary | ICD-10-CM

## 2015-07-21 DIAGNOSIS — R0602 Shortness of breath: Secondary | ICD-10-CM

## 2015-07-21 DIAGNOSIS — R0600 Dyspnea, unspecified: Secondary | ICD-10-CM

## 2015-07-21 DIAGNOSIS — R531 Weakness: Secondary | ICD-10-CM

## 2015-07-21 DIAGNOSIS — Z79899 Other long term (current) drug therapy: Secondary | ICD-10-CM

## 2015-07-21 DIAGNOSIS — E785 Hyperlipidemia, unspecified: Secondary | ICD-10-CM

## 2015-07-21 LAB — CREATININE, SERUM
Creatinine, Ser: 0.64 mg/dL (ref 0.61–1.24)
GFR calc Af Amer: >60 mL/min (ref >60)
GFR calc non Af Amer: >60 mL/min (ref >60)

## 2015-07-21 LAB — BLOOD GAS, ARTERIAL
ACID-BASE EXCESS: 8.1 mmol/L — AB (ref 0.0–3.0)
Allens test (pass/fail): POSITIVE — AB
BICARBONATE: 37 meq/L — AB (ref 21.0–28.0)
Delivery systems: POSITIVE
EXPIRATORY PAP: 5
FIO2: 0.21
Inspiratory PAP: 10
O2 Saturation: 86.3 %
PATIENT TEMPERATURE: 37
PH ART: 7.29 — AB (ref 7.350–7.450)
PO2 ART: 58 mmHg — AB (ref 83.0–108.0)
pCO2 arterial: 77 mmHg (ref 32.0–48.0)

## 2015-07-21 LAB — SODIUM: Sodium: 130 mmol/L — ABNORMAL LOW (ref 135–145)

## 2015-07-21 MED ORDER — CLONIDINE HCL 0.1 MG PO TABS
0.1000 mg | ORAL_TABLET | Freq: Two times a day (BID) | ORAL | Status: DC
  Administered 2015-07-21 – 2015-07-22 (×3): 0.1 mg via ORAL
  Filled 2015-07-21 (×3): qty 1

## 2015-07-21 MED ORDER — SODIUM CHLORIDE 1 G PO TABS
1.0000 g | ORAL_TABLET | Freq: Two times a day (BID) | ORAL | Status: DC
  Administered 2015-07-21 – 2015-07-22 (×2): 1 g via ORAL
  Filled 2015-07-21 (×4): qty 1

## 2015-07-21 NOTE — Progress Notes (Signed)
Physical Therapy Treatment Patient Details Name: Aaron Hendrix MRN: [id number] DOB: 01/31/1930 Today's Date: 07/21/2015    History of Present Illness Pt was here ~3 weeks ago needing intubation, now here with hyponatremia and bladder issues    PT Comments    Pt is very slow and unsteady with his gait. His overall distance is limited and pt requires minA+1 due to balance deficits. Pt demonstrates decreased LE strength with transfers and seated exercises. Pt would benefit from SNF placement at discharge but family currently refuses. If pt elects to return home please arrange [facility] PT and supervision with all mobility. Pt will benefit from skilled PT services to address deficits in strength, balance, and mobility in order to return to full function at home.    Follow Up Recommendations  SNF (Refuses, please arrange HH PT and OOB supervision)     Equipment Recommendations  Rolling walker with 5" wheels    Recommendations for Other Services       Precautions / Restrictions Precautions Precautions: Fall Restrictions Weight Bearing Restrictions: No    Mobility  Bed Mobility Overal bed mobility: Modified Independent Bed Mobility: Supine to Sit     Supine to sit: Modified independent (Device/Increase time);HOB elevated (use of bed rails)     General bed mobility comments: Pt demonstrates good speed and sequencing. Requires extra time to scoot toward EOB  Transfers Overall transfer level: Needs assistance Equipment used: Rolling walker (2 wheeled) Transfers: Sit to/from Stand Sit to Stand: Min guard         General transfer comment: Pt with increased time to come to standing. Balance perturbations but able to self correction without assistance. LE weakness noted in standing but no overt buckling.  Ambulation/Gait Ambulation/Gait assistance: Min assist Ambulation Distance (Feet): 60 Feet Assistive device: Rolling walker (2 wheeled) Gait Pattern/deviations:  Shuffle;Decreased step length - right;Decreased step length - left Gait velocity: decreased Gait velocity interpretation: <1.8 ft/sec, indicative of risk for recurrent falls General Gait Details: Pt with very slow gait. Pt is relatively unsteady requiring infrequent minA+1 for balance. Pt with infrequent desaturation on room air but unable to determine if it was result of true desaturation of poor signal on pulse ox. Intermittently SaO2 drops to 85% but once on supplemental O2 remains >90%. Fatigue monitored throughout.   Stairs            Wheelchair Mobility    Modified Rankin (Stroke Patients Only)       Balance Overall balance assessment: Needs assistance   Sitting balance-Leahy Scale: Fair     Standing balance support: Bilateral upper extremity supported Standing balance-Leahy Scale: Fair                      Cognition Arousal/Alertness: Awake/alert Behavior During Therapy: WFL for tasks assessed/performed Overall Cognitive Status: History of cognitive impairments - at baseline   Orientation Level: Situation;Disoriented to Current Attention Level: Divided   Following Commands: Follows multi-step commands consistently            Exercises General Exercises - Lower Extremity Long Arc Quad: Strengthening;Both;10 reps;Seated Heel Slides: Strengthening;Both;10 reps;Seated Hip ABduction/ADduction: Strengthening;Both;10 reps;Seated Hip Flexion/Marching: Strengthening;Both;10 reps;Seated Heel Raises: Strengthening;Both;10 reps;Seated    General Comments        Pertinent Vitals/Pain Pain Assessment: No/denies pain    Home Living                      Prior Function  PT Goals (current goals can now be found in the care plan section) Acute Rehab PT Goals Patient Stated Goal: family would like him to continue HHPT  PT Goal Formulation: With patient/family Time For Goal Achievement: 08/01/15 Potential to Achieve Goals:  Fair Progress towards PT goals: Progressing toward goals    Frequency  Min 2X/week    PT Plan Discharge plan needs to be updated    Co-evaluation             End of Session Equipment Utilized During Treatment: Gait belt Activity Tolerance: Patient tolerated treatment well Patient left: with family/visitor present;in chair;with call bell/phone within reach (RN aware)     Time: 4446-1901 PT Time Calculation (min) (ACUTE ONLY): 30 min  Charges:  $Gait Training: 8-22 mins $Therapeutic Exercise: 8-22 mins                    G Codes:      Lyndel Safe Tamecia Mcdougald PT, DPT   Nare Gaspari 07/21/2015, 9:59 AM

## 2015-07-21 NOTE — Progress Notes (Signed)
Plan of care progress to goal:  Patient transferred from CCU. No complaints of pain. Oriented to person only. Patient on 2L-O2, sat 100%. Na 130. Patient to have PET Scan tomorrow - to be NPO after midnight. Madlyn Frankel, RN

## 2015-07-21 NOTE — Consult Note (Signed)
[place] @ [facility] [phone]  Fax:([phone]) [place]: Jan 28, 1930  MR#: [id number]  OIL#:[id number]  Patient Care Team: [name], MD as PCP - General (Internal Medicine) [name], MD as Referring Physician (Internal Medicine)  CHIEF COMPLAINT:  Chief Complaint  Patient presents with  . abnormal labs    patient was admitted in the hospital with hyponatremia, previous admission due to respiratory failure.  Patient was intubated at that time.  CT scan of the chest during the last admission revealed right upper lobe lung mass.   Patient has been moved to the regular floor Possible discharge being planned tomorrow if patient's condition continues to improve VISIT DIAGNOSIS:     ICD-9-CM ICD-10-CM   1. Hyponatremia 276.1 E87.1 NM PET Image Initial (PI) Skull Base To Thigh     NM PET Image Initial (PI) Skull Base To Thigh  2. Somnolence 780.09 R40.0   3. Urinary frequency 788.41 R35.0   4. Hyperglycemia 790.29 R73.9   5. Lung mass 786.6 R91.8 DG Chest 2 View     DG Chest 2 View  6. Status post PICC central line placement V45.89 Z95.828 DG Chest [facility] 1 View     DG Chest [place] 1 View  7. Respiratory failure (HCC) 518.81 J96.90 CT Chest Wo Contrast     CT Chest Wo Contrast      No history exists.    Oncology Flowsheet 07/04/2015 07/05/2015 07/16/2015 07/17/2015 07/18/2015 07/19/2015 07/20/2015  enoxaparin (LOVENOX) Berger 40 mg 40 mg 40 mg 40 mg 40 mg 40 mg 40 mg  methylPREDNISolone sodium succinate 125 mg/2 mL (SOLU-MEDROL) IV - - - - - - -  ondansetron (ZOFRAN) IJ - - - - - - -  ondansetron (ZOFRAN) IV - - - - - - -  predniSONE (DELTASONE) PO - - - - - - -      REVIEW OF SYSTEMS:   Gen. status: Patient is lying in the bed very poor historian.  Says he is feeling weak and tired.  Has increasing shortness of breath increasing cough.  Poor appetite. Is and was brought back with hyponatremia Lower extremity  swelling Neurological system no headache no dizziness Skin: No rash GI: Poor appetite.  No nausea no vomiting Lungs: Increasing shortness of breath All other systems have been reviewed  As per HPI. Otherwise, a complete review of systems is negatve.  PAST MEDICAL HISTORY: Past Medical History  Diagnosis Date  . COPD with asthma ([place])   . Diabetes mellitus     Diet control   . Hyperlipidemia   . Carpal tunnel syndrome   . DJD (degenerative joint disease), cervical   . Vitamin D deficiency   . Tremor, essential   . Internal hemorrhoids   . Personal history of colonic polyps     adenomatous  . IBS (irritable bowel syndrome)   . Peptic ulcer   . Duodenal ulcer, with partial obstruction 08/10/2012  . Numbness and tingling in right hand     started 2 yeas ago  . Hypertension     no medicine needed  . Memory deficit 12/16/2013  . Essential and other specified forms of tremor 12/16/2013  . Polyneuropathy in diabetes(357.2)     PAST SURGICAL HISTORY: Past Surgical History  Procedure Laterality Date  . Anal fissure repair    . Colonoscopy w/ biopsies and polypectomy  8/03, 6/05, 7/09, 9/10    internal hemorrhoids, tubular adenomas, mucosa & lymphoid nodules  .  Upper gastrointestinal endoscopy  3/05, 7/09, 9/10,2013    gastritis, duodenitis  . Total hip arthroplasty Right 11/13/2012    Procedure: TOTAL HIP ARTHROPLASTY ANTERIOR APPROACH;  Surgeon: Mcarthur Rossetti, MD;  Location: [place];  Service: Orthopedics;  Laterality: Right;  Right total hip arthroplasty  . Ankle surgery Right     right- pins placed in    FAMILY HISTORY Family History  Problem Relation Age of Onset  . Colon cancer Neg Hx   . Esophageal cancer Neg Hx   . Rectal cancer Neg Hx   . Stomach cancer Neg Hx   . Leukemia Maternal Aunt   . Thyroid cancer Daughter 55  . Diabetes Son     GYNECOLOGIC HISTORY:  No LMP for male patient.     ADVANCED DIRECTIVES:  Patient does not have any living will or  healthcare power of attorney.  Information was given .  Available resources had been discussed.  We will follow-up on subsequent appointments regarding this issue  HEALTH MAINTENANCE: Social History  Substance Use Topics  . Smoking status: Former Smoker    Types: Cigarettes    Quit date: 07/02/1963  . Smokeless tobacco: Never Used  . Alcohol Use: 0.0 oz/week    0 Standard drinks or equivalent per week     Comment: occasional    :  Allergies  Allergen Reactions  . Fexofenadine Other (See Comments)  . Quinapril Hcl Other (See Comments)    Unknown   . Latex Rash    Current Facility-Administered Medications  Medication Dose Route Frequency Provider Last Rate Last Dose  . 0.9 %  sodium chloride infusion  250 mL Intravenous PRN Srikar Sudini, MD      . acetaminophen (TYLENOL) tablet 650 mg  650 mg Oral Q6H PRN Hillary Bow, MD       Or  . acetaminophen (TYLENOL) suppository 650 mg  650 mg Rectal Q6H PRN Srikar Sudini, MD      . acidophilus (RISAQUAD) capsule 1 capsule  1 capsule Oral Daily Hillary Bow, MD   1 capsule at 07/21/15 0942  . albuterol (PROVENTIL) (2.5 MG/3ML) 0.083% nebulizer solution 2.5 mg  2.5 mg Nebulization Q2H PRN Hillary Bow, MD      . aspirin EC tablet 81 mg  81 mg Oral Daily Hillary Bow, MD   81 mg at 07/21/15 0943  . budesonide-formoterol (SYMBICORT) 160-4.5 MCG/ACT inhaler 2 puff  2 puff Inhalation BID Hillary Bow, MD   2 puff at 07/21/15 0757  . cloNIDine (CATAPRES) tablet 0.1 mg  0.1 mg Oral BID Vishal Mungal, MD   0.1 mg at 07/21/15 1106  . enoxaparin (LOVENOX) injection 40 mg  40 mg Subcutaneous Q24H Hillary Bow, MD   40 mg at 07/20/15 2104  . feeding supplement (ENSURE ENLIVE) (ENSURE ENLIVE) liquid 237 mL  237 mL Oral BID BM Srikar Sudini, MD   237 mL at 07/21/15 1055  . furosemide (LASIX) tablet 20 mg  20 mg Oral Daily Hillary Bow, MD   20 mg at 07/21/15 0942  . hydrALAZINE (APRESOLINE) injection 10 mg  10 mg Intravenous Q6H PRN Srikar  Sudini, MD      . HYDROcodone-acetaminophen (NORCO/VICODIN) 5-325 MG per tablet 1-2 tablet  1-2 tablet Oral Q4H PRN Srikar Sudini, MD      . levothyroxine (SYNTHROID, LEVOTHROID) tablet 50 mcg  50 mcg Oral QAC breakfast Hillary Bow, MD   50 mcg at 07/21/15 0630  . losartan (COZAAR) tablet 50 mg  50 mg Oral  Daily Hillary Bow, MD   50 mg at 07/21/15 0942  . metoprolol tartrate (LOPRESSOR) tablet 25 mg  25 mg Oral BID Hillary Bow, MD   25 mg at 07/21/15 0943  . ondansetron (ZOFRAN) tablet 4 mg  4 mg Oral Q6H PRN Hillary Bow, MD       Or  . ondansetron (ZOFRAN) injection 4 mg  4 mg Intravenous Q6H PRN Srikar Sudini, MD      . pantoprazole (PROTONIX) EC tablet 40 mg  40 mg Oral QAC breakfast Hillary Bow, MD   40 mg at 07/21/15 0757  . polyethylene glycol (MIRALAX / GLYCOLAX) packet 17 g  17 g Oral Daily PRN Hillary Bow, MD   17 g at 07/19/15 0837  . sodium chloride 0.9 % injection 3 mL  3 mL Intravenous Q12H Srikar Sudini, MD   3 mL at 07/20/15 1404  . sodium chloride 0.9 % injection 3 mL  3 mL Intravenous PRN Srikar Sudini, MD      . sodium chloride tablet 1 g  1 g Oral BID WC Munsoor Lateef, MD      . tamsulosin (FLOMAX) capsule 0.4 mg  0.4 mg Oral Daily Harmeet Singh, MD   0.4 mg at 07/21/15 0942  . tiotropium (SPIRIVA) inhalation capsule 18 mcg  18 mcg Inhalation Daily Hillary Bow, MD   18 mcg at 07/21/15 0943  . traMADol (ULTRAM) tablet 50 mg  50 mg Oral Q6H PRN Hillary Bow, MD        OBJECTIVE: PHYSICAL EXAM: Patient is lying in the bed.  Not any acute distress Lungs: Emphysematous chest crepitation and right base Cardiac: Tachycardia Abdomen: Soft liver and spleen not palpable Lower extremity no edema Skin: No rash Neurological system difficult to examine in  Filed Vitals:   07/21/15 1429  BP: 114/52  Pulse: 66  Temp: 98.5 F (36.9 C)  Resp: 19     Body mass index is 23.68 kg/(m^2).    ECOG FS:2 - Symptomatic, <50% confined to bed  LAB RESULTS:  Admission on  07/16/2015  Component Date Value Ref Range Status  . Sodium 07/16/2015 118* 135 - 145 mmol/L Final   Comment: CRITICAL RESULT CALLED TO, READ BACK BY AND VERIFIED WITH  MARY NEEDHAM AT 1406 07/16/15 WDM   . Potassium 07/16/2015 4.1  3.5 - 5.1 mmol/L Final   HEMOLYSIS AT THIS LEVEL MAY AFFECT RESULT  . Chloride 07/16/2015 77* 101 - 111 mmol/L Final  . CO2 07/16/2015 28  22 - 32 mmol/L Final  . Glucose, Bld 07/16/2015 159* 65 - 99 mg/dL Final  . BUN 07/16/2015 8  6 - 20 mg/dL Final  . Creatinine, Ser 07/16/2015 0.78  0.61 - 1.24 mg/dL Final  . Calcium 07/16/2015 8.5* 8.9 - 10.3 mg/dL Final  . GFR calc non Af Amer 07/16/2015 >60  >60 mL/min Final  . GFR calc Af Amer 07/16/2015 >60  >60 mL/min Final   Comment: (NOTE) The eGFR has been calculated using the CKD EPI equation. This calculation has not been validated in all clinical situations. eGFR's persistently <60 mL/min signify possible Chronic Kidney Disease.   . Anion gap 07/16/2015 13  5 - 15 Final  . WBC 07/16/2015 6.3  3.8 - 10.6 K/uL Final  . RBC 07/16/2015 4.43  4.40 - 5.90 MIL/uL Final  . Hemoglobin 07/16/2015 13.7  13.0 - 18.0 g/dL Final  . HCT 07/16/2015 41.2  40.0 - 52.0 % Final  . MCV 07/16/2015 93.1  80.0 - 100.0  fL Final  . MCH 07/16/2015 30.9  26.0 - 34.0 pg Final  . MCHC 07/16/2015 33.2  32.0 - 36.0 g/dL Final  . RDW 07/16/2015 12.8  11.5 - 14.5 % Final  . Platelets 07/16/2015 304  150 - 440 K/uL Final  . Color, Urine 07/16/2015 STRAW* YELLOW Final  . APPearance 07/16/2015 CLEAR* CLEAR Final  . Glucose, UA 07/16/2015 NEGATIVE  NEGATIVE mg/dL Final  . Bilirubin Urine 07/16/2015 NEGATIVE  NEGATIVE Final  . Ketones, ur 07/16/2015 NEGATIVE  NEGATIVE mg/dL Final  . Specific Gravity, Urine 07/16/2015 1.008  1.005 - 1.030 Final  . Hgb urine dipstick 07/16/2015 NEGATIVE  NEGATIVE Final  . pH 07/16/2015 7.0  5.0 - 8.0 Final  . Protein, ur 07/16/2015 100* NEGATIVE mg/dL Final  . Nitrite 07/16/2015 NEGATIVE  NEGATIVE Final   . Leukocytes, UA 07/16/2015 NEGATIVE  NEGATIVE Final  . RBC / HPF 07/16/2015 0-5  0 - 5 RBC/hpf Final  . WBC, UA 07/16/2015 0-5  0 - 5 WBC/hpf Final  . Bacteria, UA 07/16/2015 NONE SEEN  NONE SEEN Final  . Squamous Epithelial / LPF 07/16/2015 0-5* NONE SEEN Final  . Mucous 07/16/2015 PRESENT   Final  . Creatinine, Urine 07/16/2015 43   Final  . Osmolality, Ur 07/16/2015 294* 300 - 900 mOsm/kg Final  . Sodium, Ur 07/16/2015 90   Final  . Sodium 07/17/2015 126* 135 - 145 mmol/L Final  . Potassium 07/17/2015 3.6  3.5 - 5.1 mmol/L Final  . Chloride 07/17/2015 84* 101 - 111 mmol/L Final  . CO2 07/17/2015 35* 22 - 32 mmol/L Final  . Glucose, Bld 07/17/2015 112* 65 - 99 mg/dL Final  . BUN 07/17/2015 8  6 - 20 mg/dL Final  . Creatinine, Ser 07/17/2015 0.69  0.61 - 1.24 mg/dL Final  . Calcium 07/17/2015 8.5* 8.9 - 10.3 mg/dL Final  . GFR calc non Af Amer 07/17/2015 >60  >60 mL/min Final  . GFR calc Af Amer 07/17/2015 >60  >60 mL/min Final   Comment: (NOTE) The eGFR has been calculated using the CKD EPI equation. This calculation has not been validated in all clinical situations. eGFR's persistently <60 mL/min signify possible Chronic Kidney Disease.   . Anion gap 07/17/2015 7  5 - 15 Final  . Sodium 07/17/2015 122* 135 - 145 mmol/L Final  . Potassium 07/17/2015 3.9  3.5 - 5.1 mmol/L Final  . Chloride 07/17/2015 84* 101 - 111 mmol/L Final  . CO2 07/17/2015 30  22 - 32 mmol/L Final  . Glucose, Bld 07/17/2015 140* 65 - 99 mg/dL Final  . BUN 07/17/2015 9  6 - 20 mg/dL Final  . Creatinine, Ser 07/17/2015 0.76  0.61 - 1.24 mg/dL Final  . Calcium 07/17/2015 8.1* 8.9 - 10.3 mg/dL Final  . GFR calc non Af Amer 07/17/2015 >60  >60 mL/min Final  . GFR calc Af Amer 07/17/2015 >60  >60 mL/min Final   Comment: (NOTE) The eGFR has been calculated using the CKD EPI equation. This calculation has not been validated in all clinical situations. eGFR's persistently <60 mL/min signify possible Chronic  Kidney Disease.   . Anion gap 07/17/2015 8  5 - 15 Final  . Sodium 07/18/2015 121* 135 - 145 mmol/L Final  . Potassium 07/18/2015 3.7  3.5 - 5.1 mmol/L Final  . Chloride 07/18/2015 84* 101 - 111 mmol/L Final  . CO2 07/18/2015 31  22 - 32 mmol/L Final  . Glucose, Bld 07/18/2015 128* 65 - 99 mg/dL Final  .  BUN 07/18/2015 10  6 - 20 mg/dL Final  . Creatinine, Ser 07/18/2015 0.72  0.61 - 1.24 mg/dL Final  . Calcium 07/18/2015 7.8* 8.9 - 10.3 mg/dL Final  . GFR calc non Af Amer 07/18/2015 >60  >60 mL/min Final  . GFR calc Af Amer 07/18/2015 >60  >60 mL/min Final   Comment: (NOTE) The eGFR has been calculated using the CKD EPI equation. This calculation has not been validated in all clinical situations. eGFR's persistently <60 mL/min signify possible Chronic Kidney Disease.   . Anion gap 07/18/2015 6  5 - 15 Final  . Sodium 07/19/2015 121* 135 - 145 mmol/L Final  . Sodium 07/19/2015 122* 135 - 145 mmol/L Final  . Sodium 07/19/2015 125* 135 - 145 mmol/L Final  . Magnesium 07/19/2015 1.4* 1.7 - 2.4 mg/dL Final  . Osmolality, Ur 07/19/2015 465  300 - 900 mOsm/kg Final  . Chloride Urine 07/19/2015 45   Final  . Sodium 07/19/2015 126* 135 - 145 mmol/L Final  . FIO2 07/19/2015 0.21   Final  . Delivery systems 07/19/2015 BILEVEL POSITIVE AIRWAY PRESSURE   Corrected  . Inspiratory PAP 07/19/2015 10   Corrected  . Expiratory PAP 07/19/2015 5   Corrected  . pH, Arterial 07/19/2015 7.29* 7.350 - 7.450 Final  . pCO2 arterial 07/19/2015 77* 32.0 - 48.0 mmHg Final   Comment: CRITICAL RESULT CALLED TO, READ BACK BY AND VERIFIED WITH: CRITICAL RESULTS DR. SUDINI,07/19/15 1231   . pO2, Arterial 07/19/2015 58* 83.0 - 108.0 mmHg Final  . Bicarbonate 07/19/2015 37.0* 21.0 - 28.0 mEq/L Final  . Acid-Base Excess 07/19/2015 8.1* 0.0 - 3.0 mmol/L Final  . O2 Saturation 07/19/2015 86.3   Final  . Patient temperature 07/19/2015 37.0   Final  . Collection site 07/19/2015 RIGHT RADIAL   Final  . Drawn by  07/19/2015 RIGHT RADIAL   Final  . Sample type 07/19/2015 ARTERIAL DRAW   Final  . Allens test (pass/fail) 07/19/2015 POSITIVE* PASS Final  . MRSA by PCR 07/19/2015 NEGATIVE  NEGATIVE Final   Comment:        The GeneXpert MRSA Assay (FDA approved for NASAL specimens only), is one component of a comprehensive MRSA colonization surveillance program. It is not intended to diagnose MRSA infection nor to guide or monitor treatment for MRSA infections.   . Osmolality 07/20/2015 265* 275 - 295 mOsm/kg Final  . FIO2 07/20/2015 0.24   Final  . Mode 07/20/2015 BIPAP   Final  . Inspiratory PAP 07/20/2015 10   Final  . Expiratory PAP 07/20/2015 5   Final  . pH, Arterial 07/20/2015 7.34* 7.350 - 7.450 Final  . pCO2 arterial 07/20/2015 67* 32.0 - 48.0 mmHg Final  . pO2, Arterial 07/20/2015 87  83.0 - 108.0 mmHg Final  . Bicarbonate 07/20/2015 36.1* 21.0 - 28.0 mEq/L Final  . Acid-Base Excess 07/20/2015 7.8* 0.0 - 3.0 mmol/L Final  . O2 Saturation 07/20/2015 96.0   Final  . Patient temperature 07/20/2015 37.0   Final  . Collection site 07/20/2015 RIGHT RADIAL   Final  . Sample type 07/20/2015 ARTERIAL DRAW   Final  . Allens test (pass/fail) 07/20/2015 PASS  PASS Final  . Sodium 07/20/2015 130* 135 - 145 mmol/L Final  . Sodium 07/20/2015 129* 135 - 145 mmol/L Final  . Sodium 07/20/2015 128* 135 - 145 mmol/L Final  . Color, Urine 07/20/2015 STRAW* YELLOW Final  . APPearance 07/20/2015 CLEAR* CLEAR Final  . Glucose, UA 07/20/2015 NEGATIVE  NEGATIVE mg/dL Final  .  Bilirubin Urine 07/20/2015 NEGATIVE  NEGATIVE Final  . Ketones, ur 07/20/2015 NEGATIVE  NEGATIVE mg/dL Final  . Specific Gravity, Urine 07/20/2015 1.010  1.005 - 1.030 Final  . Hgb urine dipstick 07/20/2015 NEGATIVE  NEGATIVE Final  . pH 07/20/2015 5.0  5.0 - 8.0 Final  . Protein, ur 07/20/2015 NEGATIVE  NEGATIVE mg/dL Final  . Nitrite 07/20/2015 NEGATIVE  NEGATIVE Final  . Leukocytes, UA 07/20/2015 NEGATIVE  NEGATIVE Final  . RBC  / HPF 07/20/2015 0-5  0 - 5 RBC/hpf Final  . WBC, UA 07/20/2015 0-5  0 - 5 WBC/hpf Final  . Bacteria, UA 07/20/2015 RARE* NONE SEEN Final  . Squamous Epithelial / LPF 07/20/2015 NONE SEEN  NONE SEEN Final  . Mucous 07/20/2015 PRESENT   Final  . Creatinine, Ser 07/21/2015 0.64  0.61 - 1.24 mg/dL Final  . GFR calc non Af Amer 07/21/2015 >60  >60 mL/min Final  . GFR calc Af Amer 07/21/2015 >60  >60 mL/min Final   Comment: (NOTE) The eGFR has been calculated using the CKD EPI equation. This calculation has not been validated in all clinical situations. eGFR's persistently <60 mL/min signify possible Chronic Kidney Disease.   . Sodium 07/21/2015 130* 135 - 145 mmol/L Final     STUDIES: Dg Chest 1 View  07/02/2015  CLINICAL DATA:  Community-acquired pneumonia, dyspnea, history of COPD -asthma. EXAM: CHEST 1 VIEW COMPARISON:  Portable chest x-ray of July 01, 2015 FINDINGS: There is interval extubation of the trachea and of the esophagus. The lungs are borderline hypoinflated. There is considerable gaseous distention of bowel loops under the hemidiaphragm. There is no significant pleural effusion. The cardiac silhouette is normal in size. The central pulmonary vascularity is mildly prominent though stable. The left internal jugular venous catheter tip projects over the proximal to midportion of the SVC. IMPRESSION: Interval extubation of the trachea. Mild hypoinflation. Prominent gaseous distention of bowel under the right hemidiaphragm and to a lesser extent on the left. There is no alveolar pneumonia. Minimal right basilar atelectasis is suspected. An abdominal series would be useful to further assess the status of the bowel and exclude occult free subdiaphragmatic gas. Electronically Signed   By: David  Martinique M.D.   On: 07/02/2015 07:29   Dg Chest 1 View  07/01/2015  CLINICAL DATA:  Shortness of breath. EXAM: CHEST 1 VIEW COMPARISON:  06/29/2015.  CT 06/25/2015 FINDINGS: Endotracheal tube, NG  tube, left IJ line stable position. Heart size normal. Right upper lobe nodular density is difficult to identify due to overlying EKG lead. No new focal pulmonary infiltrate. No pleural effusion or pneumothorax. No acute bony abnormality. IMPRESSION: 1. Lines and tubes in stable position. 2. Right upper lobe nodular density is difficult to visualize due to overlying EKG lead. No new infiltrate. Reference is made to prior CT report 06/25/2015. Electronically Signed   By: Marcello Moores  Register   On: 07/01/2015 07:21   Dg Chest 1 View  06/29/2015  CLINICAL DATA:  On ventilator, shortness of breath. EXAM: CHEST 1 VIEW COMPARISON:  06/28/2015 and CT chest 06/25/2015. FINDINGS: Endotracheal tube terminates approximately 3 cm above the carina. Left IJ central line tip projects over the upper SVC. Nasogastric tube terminates in the stomach. Heart size normal. Mild bibasilar atelectasis. Nodular lesion in the right perihilar region, as on 06/25/2015. No pleural fluid. IMPRESSION: 1. Right upper lobe nodule, as on 06/25/2015. Follow-up CT chest without contrast is recommended in 3-4 weeks in further evaluation, as malignancy cannot be excluded. 2. Mild  bibasilar subsegmental atelectasis. Electronically Signed   By: Lorin Picket M.D.   On: 06/29/2015 07:53   Dg Chest 1 View  06/28/2015  CLINICAL DATA:  Hypoxia EXAM: CHEST 1 VIEW COMPARISON:  June 26, 2015 chest radiograph and chest CT June 25, 2015 FINDINGS: Endotracheal tube tip is 3.4 cm above the carina. Central catheter tip is in the superior vena cava. Nasogastric tube tip and side port are in the stomach. No pneumothorax. No edema or consolidation. The known nodular lesion in the right upper lobe seen on CT is not appreciable by radiography. Heart size and pulmonary vascularity are normal. No adenopathy. IMPRESSION: Tube and catheter positions as described without pneumothorax. No edema or consolidation. Right upper lobe nodular lesions seen on recent CT is  not appreciable on this examination. Electronically Signed   By: Lowella Grip III M.D.   On: 06/28/2015 08:00   Dg Chest 1 View  06/26/2015  CLINICAL DATA:  Central line placement, intubation EXAM: CHEST 1 VIEW COMPARISON:  06/25/2015 chest radiograph and CT FINDINGS: Endotracheal tube is appropriately positioned. Left IJ central line tip terminates over the mid SVC. Heart size normal. Lungs are grossly clear. Right upper lobe nodule is not as well seen as on the prior exam, possibly obscured by support apparatus. Nasogastric tube tip terminates below the level of the diaphragms but is not included in the field of view. IMPRESSION: No new acute finding.  Support apparatus as above. Electronically Signed   By: Conchita Paris M.D.   On: 06/26/2015 11:23   Dg Chest 2 View  07/16/2015  CLINICAL DATA:  Hyponatremia.  Right upper lobe mass.  COPD. EXAM: CHEST  2 VIEW COMPARISON:  07/02/2015 FINDINGS: Left jugular central venous catheter is been removed since previous study. Nodular density again seen in the right upper lobe, better visualized on recent chest CT of 06/25/2015. No evidence of pulmonary consolidation or edema. No evidence of pneumothorax or pleural effusion. IMPRESSION: No acute findings. Right upper lobe nodular density, better demonstrated on recent chest CT of 06/25/2015. Electronically Signed   By: Earle Gell M.D.   On: 07/16/2015 16:02   Dg Chest 2 View  06/25/2015  CLINICAL DATA:  Hyponatremia.  Weakness. EXAM: CHEST  2 VIEW COMPARISON:  06/01/2014 and 04/25/2012 FINDINGS: There is a vague nodular appearing density in the right upper lobe between the anterior aspects of the right second and third ribs. There is adjacent oxygen 2 being. This could that be artifactual. Lungs are otherwise clear. Heart size and vascularity are normal. Interposed bowel under the right hemidiaphragm, unchanged. No acute osseous abnormality. IMPRESSION: Vague density in the right upper lobe. This may be  artifactual. I recommend repeat AP or PA chest x-ray prior to discharge. Electronically Signed   By: Lorriane Shire M.D.   On: 06/25/2015 16:27   Dg Abd 1 View  07/02/2015  CLINICAL DATA:  Abdominal pain and distention, ileus EXAM: ABDOMEN - 1 VIEW COMPARISON:  06/26/2015 FINDINGS: There is gaseous distension of both large and small bowel loops no evidence for free intraperitoneal air on this supine view. No organomegaly or abnormal calcifications. Previous right hip arthroplasty. Degenerative changes in the hips and spine. IMPRESSION: Ileus bowel gas pattern. Electronically Signed   By: Nolon Nations M.D.   On: 07/02/2015 09:20   Dg Abd 1 View  06/26/2015  CLINICAL DATA:  Evaluate OG tube placement. EXAM: ABDOMEN - 1 VIEW COMPARISON:  06/04/2014 FINDINGS: The OG tube tip is in the stomach.  Side port is below the GE junction. No dilated loops of bowel identified. Gas and stool noted within the colon up to the rectum. IMPRESSION: 1. OG tube tip is in the stomach. 2. Nonobstructive bowel gas pattern. Electronically Signed   By: Kerby Moors M.D.   On: 06/26/2015 11:49   Ct Head Wo Contrast  06/25/2015  CLINICAL DATA:  Altered mental status.  Weakness.  Hyponatremia. EXAM: CT HEAD WITHOUT CONTRAST TECHNIQUE: Contiguous axial images were obtained from the base of the skull through the vertex without intravenous contrast. COMPARISON:  12/02/2008 head CT.  12/21/2013 brain MRI. FINDINGS: No evidence of parenchymal hemorrhage or extra-axial fluid collection. No mass lesion, mass effect, or midline shift. No CT evidence of acute infarction. Stable generalized cerebral volume loss. Cerebral ventricle size is are stable and concordant with the degree of cerebral volume loss. Atherosclerotic intracranial arteries. Nonspecific subcortical and periventricular white matter hypodensity, most in keeping with chronic small vessel ischemic change. The visualized paranasal sinuses are essentially clear. The mastoid air  cells are unopacified. No evidence of calvarial fracture. IMPRESSION: 1. No acute intracranial abnormality. 2. Generalized cerebral volume loss and chronic small vessel ischemic change. Electronically Signed   By: Ilona Sorrel M.D.   On: 06/25/2015 17:52   Ct Chest Wo Contrast  07/20/2015  CLINICAL DATA:  Subsequent encounter for hyponatremia and COPD exacerbation. EXAM: CT CHEST WITHOUT CONTRAST TECHNIQUE: Multidetector CT imaging of the chest was performed following the standard protocol without IV contrast. COMPARISON:  06/25/2015. FINDINGS: Mediastinum / Lymph Nodes: There is no axillary lymphadenopathy. No mediastinal lymphadenopathy. There is no hilar lymphadenopathy. Heart is borderline enlarged. Coronary artery calcification is noted. Lungs / Pleura: 1.5 cm spiculated lesion in the right upper lobe previously has decreased substantially in the interval and has nearly resolved. Other patchy nodularity in the posterior right upper lobe has improved in the interval. Interval development of compressive atelectasis in the dependent lung bases with small bilateral pleural effusions. Superimposed infection in the right base cannot be entirely excluded. MSK / Soft Tissues: Bone windows reveal no worrisome lytic or sclerotic osseous lesions. Gas is identified in the right axillary region and may be within venous anatomy or within the soft tissues. Upper Abdomen: Trace amount of free fluid is seen adjacent to the liver. There is some edema in the retroperitoneal fat, slightly progressed in the interval and nonspecific. IMPRESSION: 1. Interval near complete resolution of the dominant spiculated opacity in the right upper lobe with associated interval improvement in the adjacent patchy nodularity. 2. Interval development of dependent collapse/consolidation, right greater than left with small bilateral pleural effusions. 3. Soft tissue gas in the right axilla may be within venous anatomy. Correlation for venous  access in the right upper extremity may prove helpful. Soft tissue gas from laceration or infection would also be a consideration. 4. Small volume perihepatic ascites. Electronically Signed   By: Misty Stanley M.D.   On: 07/20/2015 12:34   Ct Chest W Contrast  06/25/2015  CLINICAL DATA:  Abnormal chest x-ray EXAM: CT CHEST WITH CONTRAST TECHNIQUE: Multidetector CT imaging of the chest was performed during intravenous contrast administration. CONTRAST:  40m OMNIPAQUE IOHEXOL 300 MG/ML  SOLN COMPARISON:  Plain film from earlier in the same day FINDINGS: The left lung is well aerated without focal infiltrate or sizable effusion. The right lung is also well aerated but demonstrates patchy changes in the posterior aspect of the right upper lobe. Some of these the appearance of early infiltrate although  a more organized 15 mm mildly spiculated lesion is noted in the upper lobe best seen on image number 19 of series 3. No other focal lesions are noted. The thoracic inlet is within normal limits. Calcifications are noted in the thoracic aorta and its branches although no aneurysmal dilatation or dissection is seen. Heavy coronary calcifications are noted particularly in the left anterior descending coronary artery. The pulmonary artery demonstrates a normal branching pattern without definitive filling defect. No significant lymphadenopathy is seen. Scanning into the upper abdomen reveals no acute abnormality. A left renal cyst is noted. The osseous structures are within normal limits. IMPRESSION: Somewhat spiculated mass lesion in the right upper lobe with some associated right upper lobe infiltrative changes. This must be viewed with a degree of suspicion for underlying neoplasm. Continued workup is recommended. This lesion would be amenable to percutaneous biopsy if clinically indicated. Electronically Signed   By: Inez Catalina M.D.   On: 06/25/2015 17:51   US Venous Img Lower Bilateral  07/13/2015  CLINICAL DATA:   Bilateral lower extremity edema. History of smoking. Evaluate for DVT. EXAM: BILATERAL LOWER EXTREMITY VENOUS DOPPLER ULTRASOUND TECHNIQUE: Gray-scale sonography with graded compression, as well as color Doppler and duplex ultrasound were performed to evaluate the lower extremity deep venous systems from the level of the common femoral vein and including the common femoral, femoral, profunda femoral, popliteal and calf veins including the posterior tibial, peroneal and gastrocnemius veins when visible. The superficial great saphenous vein was also interrogated. Spectral Doppler was utilized to evaluate flow at rest and with distal augmentation maneuvers in the common femoral, femoral and popliteal veins. COMPARISON:  None. FINDINGS: RIGHT LOWER EXTREMITY Common Femoral Vein: No evidence of thrombus. Normal compressibility, respiratory phasicity and response to augmentation. Saphenofemoral Junction: No evidence of thrombus. Normal compressibility and flow on color Doppler imaging. Profunda Femoral Vein: No evidence of thrombus. Normal compressibility and flow on color Doppler imaging. Femoral Vein: No evidence of thrombus. Normal compressibility, respiratory phasicity and response to augmentation. Popliteal Vein: No evidence of thrombus. Normal compressibility, respiratory phasicity and response to augmentation. Calf Veins: No evidence of thrombus. Normal compressibility and flow on color Doppler imaging. Superficial Great Saphenous Vein: No evidence of thrombus. Normal compressibility and flow on color Doppler imaging. Venous Reflux:  None. Other Findings:  None. LEFT LOWER EXTREMITY Common Femoral Vein: No evidence of thrombus. Normal compressibility, respiratory phasicity and response to augmentation. Saphenofemoral Junction: No evidence of thrombus. Normal compressibility and flow on color Doppler imaging. Profunda Femoral Vein: No evidence of thrombus. Normal compressibility and flow on color Doppler imaging.  Femoral Vein: No evidence of thrombus. Normal compressibility, respiratory phasicity and response to augmentation. Popliteal Vein: No evidence of thrombus. Normal compressibility, respiratory phasicity and response to augmentation. Calf Veins: No evidence of thrombus. Normal compressibility and flow on color Doppler imaging. Superficial Great Saphenous Vein: No evidence of thrombus. Normal compressibility and flow on color Doppler imaging. Venous Reflux:  None. Other Findings:  None. IMPRESSION: No evidence of DVT within either lower extremity. Electronically Signed   By: Sandi Mariscal M.D.   On: 07/13/2015 12:41   Dg Chest Port 1 View  07/20/2015  CLINICAL DATA:  PICC placement.  Initial encounter. EXAM: PORTABLE CHEST 1 VIEW COMPARISON:  Chest radiograph performed 07/16/2015 FINDINGS: The right PICC is coiled within the patient's proximal right arm. Scattered associated soft tissue air is noted. This is likely coiled within the soft tissues. The lungs are well-aerated. Minimal left basilar atelectasis is noted.  There is no evidence of pleural effusion or pneumothorax. The cardiomediastinal silhouette is borderline normal in size. No acute osseous abnormalities are seen. IMPRESSION: 1. Right PICC coiled within the patient's proximal right arm, with scattered associated soft tissue air. This is likely coiled within the soft tissues, and should be removed. 2. Minimal left basilar atelectasis noted.  Lungs otherwise clear. These results were called by telephone at the time of interpretation on 07/20/2015 at 12:50 am to Nursing at the [facility] ICU, who verbally acknowledged these results. Electronically Signed   By: Garald Balding M.D.   On: 07/20/2015 00:53     ASSESSMENT: Hyponatremia  etiology is not clear possibility of congestive heart failure possibility of paraneoplastic syndrome A right upper upper lobe lung mass is resolved.  There is right lower lobe consolidation. If this is paraneoplastic  syndrome and possibility of other malignancy cannot be ruled out It may be prudent to proceed with PET scan being I discussed that with Dr. Mortimer Fries and he is in agreement with it I will discuss that with the family  PLAN:   Sodium is improved to 1:30  PET scan is being planned tomorrow at 8:30  Patient can be discharged after PET scanning  I will follow-up on the result and decide to arrange further follow-up if needed.  If patient has a negative PET scan and then no further follow-up with oncology service needed.   Patient expressed understanding and was in agreement with this plan. He also understands that He can call clinic at any time with any questions, concerns, or complaints.    No matching staging information was found for the patient.  Forest Gleason, MD   07/21/2015 4:27 PM

## 2015-07-21 NOTE — Progress Notes (Signed)
Initial Nutrition Assessment  DOCUMENTATION CODES:   Non-severe (moderate) malnutrition in context of chronic illness  INTERVENTION:   Meals and Snacks: Cater to patient preferences Medical Food Supplement Therapy: Agree with Ensure Enlive po TID, each supplement provides 350 kcal and 20 grams of protein. Will also send Magic Cup BID.   NUTRITION DIAGNOSIS:   Malnutrition related to chronic illness as evidenced by energy intake < 75% for > or equal to 1 month, moderate depletion of body fat, severe depletion of muscle mass.  GOAL:   Patient will meet greater than or equal to 90% of their needs  MONITOR:    (Energy Intake, Anthropometrics, Digestive System, Electrolyte and Renal Profile)  REASON FOR ASSESSMENT:   Consult Assessment of nutrition requirement/status  ASSESSMENT:   Pt admitted with hyponatremia and weakness. Per MD note pt followed by oncology for ight lung nodule, PET scan scheduled outpatient.  Pt upon admission on bipap transferred to ICU. Pt now returns on oncology unit, off of bipap on visit. Pt falling asleep throughout visit.  Past Medical History  Diagnosis Date  . COPD with asthma ([place])   . Diabetes mellitus     Diet control   . Hyperlipidemia   . Carpal tunnel syndrome   . DJD (degenerative joint disease), cervical   . Vitamin D deficiency   . Tremor, essential   . Internal hemorrhoids   . Personal history of colonic polyps     adenomatous  . IBS (irritable bowel syndrome)   . Peptic ulcer   . Duodenal ulcer, with partial obstruction 08/10/2012  . Numbness and tingling in right hand     started 2 yeas ago  . Hypertension     no medicine needed  . Memory deficit 12/16/2013  . Essential and other specified forms of tremor 12/16/2013  . Polyneuropathy in diabetes(357.2)     Diet Order:  Diet Carb Modified Fluid consistency:: Thin; Room service appropriate?: Yes Diet NPO time specified    Current Nutrition: Pt ate 25% of [place], mashed  potatoes and fruit at lunch today. Per report, pt difficult to keep awake. Wife reports pt cleaned his plate this am at breakfast. Since admission po intake on average 57% of recorded po intake.  Food/Nutrition-Related History: Pt wife reports pt appetite has been less than usual but still eats 3 meals per day over the past month since last admission. Wife reports sometimes pt eating half of meal sometimes more, depending.  Pt wife reports pt usually always has atleast one Ensure per day as well.    Scheduled Medications:  . acidophilus  1 capsule Oral Daily  . aspirin EC  81 mg Oral Daily  . budesonide-formoterol  2 puff Inhalation BID  . cloNIDine  0.1 mg Oral BID  . enoxaparin (LOVENOX) injection  40 mg Subcutaneous Q24H  . feeding supplement (ENSURE ENLIVE)  237 mL Oral BID BM  . furosemide  20 mg Oral Daily  . levothyroxine  50 mcg Oral QAC breakfast  . losartan  50 mg Oral Daily  . metoprolol tartrate  25 mg Oral BID  . pantoprazole  40 mg Oral QAC breakfast  . sodium chloride  3 mL Intravenous Q12H  . sodium chloride  1 g Oral BID WC  . tamsulosin  0.4 mg Oral Daily  . tiotropium  18 mcg Inhalation Daily    Electrolyte/Renal Profile and Glucose Profile:   Recent Labs Lab 07/17/15 0429 07/17/15 1347 07/18/15 0537  07/19/15 1232  07/20/15 1322 07/20/15  1831 07/21/15 0610  NA 126* 122* 121*  < > 122*  < > 129* 128* 130*  K 3.6 3.9 3.7  --   --   --   --   --   --   CL 84* 84* 84*  --   --   --   --   --   --   CO2 35* 30 31  --   --   --   --   --   --   BUN '8 9 10  '$ --   --   --   --   --   --   CREATININE 0.69 0.76 0.72  --   --   --   --   --  0.64  CALCIUM 8.5* 8.1* 7.8*  --   --   --   --   --   --   MG  --   --   --   --  1.4*  --   --   --   --   GLUCOSE 112* 140* 128*  --   --   --   --   --   --   < > = values in this interval not displayed.   Protein Profile: No results for input(s): ALBUMIN in the last 168 hours.   Gastrointestinal Profile: Last BM:   07/19/2015   Nutrition-Focused Physical Exam Findings: Nutrition-Focused physical exam completed. Findings are mild-moderate fat depletion, moderate-severe muscle depletion, and 2+ edema of lower extremities.    Weight Change:  Pt wife reports stable weight PTA. Per [facility] relatively stable weight, however RD notes weight of 153lbs documented at end of discharge last admission 10/10.   Height:   Ht Readings from Last 1 Encounters:  07/16/15 '5\' 4"'$  (1.626 m)    Weight:   Wt Readings from Last 1 Encounters:  07/21/15 138 lb 0.1 oz (62.6 kg)    Wt Readings from Last 10 Encounters:  07/21/15 138 lb 0.1 oz (62.6 kg)  07/15/15 135 lb (61.236 kg)  07/13/15 136 lb (61.689 kg)  07/12/15 134 lb (60.782 kg)  07/06/15 153 lb 9.6 oz (69.673 kg)  06/10/15 141 lb 8 oz (64.184 kg)  02/06/15 135 lb 6 oz (61.406 kg)  09/23/14 127 lb 8 oz (57.834 kg)  06/19/14 123 lb 4 oz (55.906 kg)  05/22/14 126 lb (57.153 kg)     BMI:  Body mass index is 23.68 kg/(m^2).  Estimated Nutritional Needs:   Kcal:  BEE: 1232kcals, TEE: (IF 1.1-1.3)(AF 1.2) 1626-1923kcals  Protein:  50-63g protein  (0.8-1.0g/kg)  Fluid:  1565-188m of fluid (25-356mkg)  EDUCATION NEEDS:   Education needs no appropriate at this time   HIMidwayRD, LDN Pager ([phone]

## 2015-07-21 NOTE — Consult Note (Signed)
PULMONARY / CRITICAL CARE MEDICINE   Name: Aaron Hendrix MRN: [id number] DOB: 16-Dec-1929    ADMISSION DATE:  07/16/2015 CONSULTATION DATE:  07/19/15  REFERRING MD :  Dr. Darvin Neighbours   CHIEF COMPLAINT:    hyponatremia, lethargic   HISTORY OF PRESENT ILLNESS  On [place] now Alert, follows commands, may use intermittent Bipap Will repeat CT chest to assess for interval changes   SIGNIFICANT EVENTS   9/29>>unresponsive>>intubated>>hyponatremia, hypercarbia, RUL lesion 10/5>>extubated 10/10>>discharged 10/20>>readmitted for hyponatremia 10/23>>lethargic, with hypercarbia, transferred to ICU, placed on bipap 10/24>>transition back to [place], doing well, transferred to medical floor    PAST MEDICAL HISTORY    :  Past Medical History  Diagnosis Date  . COPD with asthma ([place])   . Diabetes mellitus     Diet control   . Hyperlipidemia   . Carpal tunnel syndrome   . DJD (degenerative joint disease), cervical   . Vitamin D deficiency   . Tremor, essential   . Internal hemorrhoids   . Personal history of colonic polyps     adenomatous  . IBS (irritable bowel syndrome)   . Peptic ulcer   . Duodenal ulcer, with partial obstruction 08/10/2012  . Numbness and tingling in right hand     started 2 yeas ago  . Hypertension     no medicine needed  . Memory deficit 12/16/2013  . Essential and other specified forms of tremor 12/16/2013  . Polyneuropathy in diabetes(357.2)    Past Surgical History  Procedure Laterality Date  . Anal fissure repair    . Colonoscopy w/ biopsies and polypectomy  8/03, 6/05, 7/09, 9/10    internal hemorrhoids, tubular adenomas, mucosa & lymphoid nodules  . Upper gastrointestinal endoscopy  3/05, 7/09, 9/10,2013    gastritis, duodenitis  . Total hip arthroplasty Right 11/13/2012    Procedure: TOTAL HIP ARTHROPLASTY ANTERIOR APPROACH;  Surgeon: Mcarthur Rossetti, MD;  Location: [place];  Service: Orthopedics;  Laterality: Right;  Right total hip  arthroplasty  . Ankle surgery Right     right- pins placed in   Prior to Admission medications   Medication Sig Start Date End Date Taking? Authorizing Provider  acetaminophen (TYLENOL) 500 MG tablet Take 500 mg by mouth every 6 (six) hours as needed.   Yes Historical Provider, MD  albuterol (PROVENTIL) (2.5 MG/3ML) 0.083% nebulizer solution Take 3 mLs (2.5 mg total) by nebulization every 6 (six) hours as needed for wheezing or shortness of breath. 07/13/15  Yes [name], MD  budesonide-formoterol [facility]) 160-4.5 MCG/ACT inhaler Inhale 2 puffs into the lungs 2 (two) times daily.   Yes Historical Provider, MD  cloNIDine (CATAPRES) 0.1 MG tablet Take 1 tablet (0.1 mg total) by mouth 2 (two) times daily. 07/06/15  Yes Fritzi Mandes, MD  Docusate Calcium (STOOL SOFTENER PO) Take by mouth.   Yes Historical Provider, MD  feeding supplement, ENSURE ENLIVE, (ENSURE ENLIVE) LIQD Take 237 mLs by mouth 2 (two) times daily between meals. 07/06/15  Yes Fritzi Mandes, MD  Fluticasone Furoate-Vilanterol 100-25 MCG/INH AEPB Inhale 1 puff into the lungs daily. 07/13/15  Yes [name], MD  ipratropium (ATROVENT) 0.06 % nasal spray Place 2 sprays into the nose daily as needed.    Yes Historical Provider, MD  levothyroxine (SYNTHROID, LEVOTHROID) 50 MCG tablet Take 50 mcg by mouth daily before breakfast.   Yes Reynold Bowen, MD  losartan-hydrochlorothiazide (HYZAAR) 50-12.5 MG tablet Take 1 tablet by mouth daily. 07/13/15 07/12/16 Yes [name], MD  metoprolol  tartrate (LOPRESSOR) 25 MG tablet Take 1 tablet (25 mg total) by mouth 2 (two) times daily. 07/06/15  Yes Fritzi Mandes, MD  pantoprazole (PROTONIX) 40 MG tablet Take 40 mg by mouth daily.   Yes Historical Provider, MD  saccharomyces boulardii (FLORASTOR) 250 MG capsule Take 250 mg by mouth daily.   Yes Historical Provider, MD  tiotropium (SPIRIVA) 18 MCG inhalation capsule Place 1 capsule (18 mcg total) into inhaler and inhale daily. 07/06/15   Yes Fritzi Mandes, MD  traMADol (ULTRAM) 50 MG tablet Take by mouth at bedtime as needed.   Yes Historical Provider, MD  zolpidem (AMBIEN) 5 MG tablet Take 5 mg by mouth at bedtime as needed for sleep.   Yes Historical Provider, MD  amLODipine (NORVASC) 5 MG tablet Take 1 tablet (5 mg total) by mouth daily. Patient not taking: Reported on 07/15/2015 07/06/15   Fritzi Mandes, MD   Allergies  Allergen Reactions  . Fexofenadine Other (See Comments)  . Quinapril Hcl Other (See Comments)    Unknown   . Latex Rash     FAMILY HISTORY   Family History  Problem Relation Age of Onset  . Colon cancer Neg Hx   . Esophageal cancer Neg Hx   . Rectal cancer Neg Hx   . Stomach cancer Neg Hx   . Leukemia Maternal Aunt   . Thyroid cancer Daughter 35  . Diabetes Son       SOCIAL HISTORY    reports that he quit smoking about 52 years ago. His smoking use included Cigarettes. He has never used smokeless tobacco. He reports that he drinks alcohol. He reports that he does not use illicit drugs.  Review of Systems  Constitutional: Negative for fever, chills, weight loss and malaise/fatigue.  Eyes: Negative for blurred vision and double vision.  Respiratory: Negative for shortness of breath.   Gastrointestinal: Negative for heartburn and nausea.  Genitourinary: Negative for dysuria.  Skin: Negative for itching and rash.  Neurological: Negative for dizziness, weakness and headaches.  Endo/Heme/Allergies: Does not bruise/bleed easily.      VITAL SIGNS    Temp:  [98.2 F (36.8 C)-98.5 F (36.9 C)] 98.4 F (36.9 C) (10/25 0755) Pulse Rate:  [67-120] 120 (10/25 1000) Resp:  [12-30] 24 (10/25 1000) BP: (111-179)/(50-128) 135/81 mmHg (10/25 1000) SpO2:  [86 %-100 %] 92 % (10/25 1000) Weight:  [138 lb 0.1 oz (62.6 kg)] 138 lb 0.1 oz (62.6 kg) (10/25 0500) HEMODYNAMICS:   VENTILATOR SETTINGS:   INTAKE / OUTPUT:  Intake/Output Summary (Last 24 hours) at 07/21/15 1114 Last data filed at  07/21/15 1000  Gross per 24 hour  Intake   2803 ml  Output   2855 ml  Net    -52 ml       PHYSICAL EXAM   Physical Exam  Constitutional: He is oriented to person, place, and time.  HENT:  Head: Normocephalic and atraumatic.  Right Ear: External ear normal.  Left Ear: External ear normal.  Eyes: EOM are normal. Pupils are equal, round, and reactive to light.  Neck: Normal range of motion. Neck supple.  Cardiovascular: Normal rate, regular rhythm and normal heart sounds.   Pulmonary/Chest: He has no wheezes. He has no rales.  On [place] No use of accessory muscles Mild shallow breath sounds at bases  Abdominal: Soft. He exhibits no distension. There is no tenderness.  Neurological: He is alert and oriented to person, place, and time.  Skin: Skin is warm.  Nursing note and  vitals reviewed.      LABS   LABS:  CBC  Recent Labs Lab 07/16/15 1241  WBC 6.3  HGB 13.7  HCT 41.2  PLT 304   Coag's No results for input(s): APTT, INR in the last 168 hours. BMET  Recent Labs Lab 07/17/15 0429 07/17/15 1347 07/18/15 0537  07/20/15 1322 07/20/15 1831 07/21/15 0610  NA 126* 122* 121*  < > 129* 128* 130*  K 3.6 3.9 3.7  --   --   --   --   CL 84* 84* 84*  --   --   --   --   CO2 35* 30 31  --   --   --   --   BUN '8 9 10  '$ --   --   --   --   CREATININE 0.69 0.76 0.72  --   --   --  0.64  GLUCOSE 112* 140* 128*  --   --   --   --   < > = values in this interval not displayed. Electrolytes  Recent Labs Lab 07/17/15 0429 07/17/15 1347 07/18/15 0537 07/19/15 1232  CALCIUM 8.5* 8.1* 7.8*  --   MG  --   --   --  1.4*   Sepsis Markers No results for input(s): LATICACIDVEN, PROCALCITON, O2SATVEN in the last 168 hours. ABG  Recent Labs Lab 07/19/15 1220 07/20/15 0514  PHART 7.29* 7.34*  PCO2ART 77* 67*  PO2ART 58* 87   Liver Enzymes No results for input(s): AST, ALT, ALKPHOS, BILITOT, ALBUMIN in the last 168 hours. Cardiac Enzymes No results for input(s):  TROPONINI, PROBNP in the last 168 hours. Glucose No results for input(s): GLUCAP in the last 168 hours.   Recent Results (from the past 240 hour(s))  MRSA PCR Screening     Status: None   Collection Time: 07/19/15 12:24 PM  Result Value Ref Range Status   MRSA by PCR NEGATIVE NEGATIVE Final    Comment:        The GeneXpert MRSA Assay (FDA approved for NASAL specimens only), is one component of a comprehensive MRSA colonization surveillance program. It is not intended to diagnose MRSA infection nor to guide or monitor treatment for MRSA infections.      Current facility-administered medications:  .  0.9 %  sodium chloride infusion, 250 mL, Intravenous, PRN, Srikar Sudini, MD .  0.9 %  sodium chloride infusion, , Intravenous, Continuous, Flora Lipps, MD, Last Rate: 125 mL/hr at 07/21/15 0400 .  acetaminophen (TYLENOL) tablet 650 mg, 650 mg, Oral, Q6H PRN **OR** acetaminophen (TYLENOL) suppository 650 mg, 650 mg, Rectal, Q6H PRN, Hillary Bow, MD .  acidophilus (RISAQUAD) capsule 1 capsule, 1 capsule, Oral, Daily, Hillary Bow, MD, 1 capsule at 07/21/15 0942 .  albuterol (PROVENTIL) (2.5 MG/3ML) 0.083% nebulizer solution 2.5 mg, 2.5 mg, Nebulization, Q2H PRN, Srikar Sudini, MD .  aspirin EC tablet 81 mg, 81 mg, Oral, Daily, Hillary Bow, MD, 81 mg at 07/21/15 0943 .  budesonide-formoterol (SYMBICORT) 160-4.5 MCG/ACT inhaler 2 puff, 2 puff, Inhalation, BID, Hillary Bow, MD, 2 puff at 07/21/15 0757 .  cloNIDine (CATAPRES) tablet 0.1 mg, 0.1 mg, Oral, BID, Ariyanah Aguado, MD, 0.1 mg at 07/21/15 1106 .  enoxaparin (LOVENOX) injection 40 mg, 40 mg, Subcutaneous, Q24H, Srikar Sudini, MD, 40 mg at 07/20/15 2104 .  feeding supplement (ENSURE ENLIVE) (ENSURE ENLIVE) liquid 237 mL, 237 mL, Oral, BID BM, Srikar Sudini, MD, 237 mL at 07/21/15 1055 .  furosemide (LASIX)  tablet 20 mg, 20 mg, Oral, Daily, Hillary Bow, MD, 20 mg at 07/21/15 0942 .  hydrALAZINE (APRESOLINE) injection 10 mg, 10  mg, Intravenous, Q6H PRN, Srikar Sudini, MD .  HYDROcodone-acetaminophen (NORCO/VICODIN) 5-325 MG per tablet 1-2 tablet, 1-2 tablet, Oral, Q4H PRN, Srikar Sudini, MD .  levothyroxine (SYNTHROID, LEVOTHROID) tablet 50 mcg, 50 mcg, Oral, QAC breakfast, Hillary Bow, MD, 50 mcg at 07/21/15 0630 .  losartan (COZAAR) tablet 50 mg, 50 mg, Oral, Daily, Hillary Bow, MD, 50 mg at 07/21/15 0942 .  metoprolol tartrate (LOPRESSOR) tablet 25 mg, 25 mg, Oral, BID, Hillary Bow, MD, 25 mg at 07/21/15 0943 .  ondansetron (ZOFRAN) tablet 4 mg, 4 mg, Oral, Q6H PRN **OR** ondansetron (ZOFRAN) injection 4 mg, 4 mg, Intravenous, Q6H PRN, Srikar Sudini, MD .  pantoprazole (PROTONIX) EC tablet 40 mg, 40 mg, Oral, QAC breakfast, Hillary Bow, MD, 40 mg at 07/21/15 0757 .  polyethylene glycol (MIRALAX / GLYCOLAX) packet 17 g, 17 g, Oral, Daily PRN, Hillary Bow, MD, 17 g at 07/19/15 0837 .  sodium chloride 0.9 % injection 3 mL, 3 mL, Intravenous, Q12H, Srikar Sudini, MD, 3 mL at 07/20/15 1404 .  sodium chloride 0.9 % injection 3 mL, 3 mL, Intravenous, PRN, Hillary Bow, MD .  tamsulosin (FLOMAX) capsule 0.4 mg, 0.4 mg, Oral, Daily, Harmeet Singh, MD, 0.4 mg at 07/21/15 0942 .  tiotropium (SPIRIVA) inhalation capsule 18 mcg, 18 mcg, Inhalation, Daily, Hillary Bow, MD, 18 mcg at 07/21/15 0943 .  traMADol (ULTRAM) tablet 50 mg, 50 mg, Oral, Q6H PRN, Hillary Bow, MD  IMAGING    Ct Chest Wo Contrast  07/20/2015  CLINICAL DATA:  Subsequent encounter for hyponatremia and COPD exacerbation. EXAM: CT CHEST WITHOUT CONTRAST TECHNIQUE: Multidetector CT imaging of the chest was performed following the standard protocol without IV contrast. COMPARISON:  06/25/2015. FINDINGS: Mediastinum / Lymph Nodes: There is no axillary lymphadenopathy. No mediastinal lymphadenopathy. There is no hilar lymphadenopathy. Heart is borderline enlarged. Coronary artery calcification is noted. Lungs / Pleura: 1.5 cm spiculated lesion in the  right upper lobe previously has decreased substantially in the interval and has nearly resolved. Other patchy nodularity in the posterior right upper lobe has improved in the interval. Interval development of compressive atelectasis in the dependent lung bases with small bilateral pleural effusions. Superimposed infection in the right base cannot be entirely excluded. MSK / Soft Tissues: Bone windows reveal no worrisome lytic or sclerotic osseous lesions. Gas is identified in the right axillary region and may be within venous anatomy or within the soft tissues. Upper Abdomen: Trace amount of free fluid is seen adjacent to the liver. There is some edema in the retroperitoneal fat, slightly progressed in the interval and nonspecific. IMPRESSION: 1. Interval near complete resolution of the dominant spiculated opacity in the right upper lobe with associated interval improvement in the adjacent patchy nodularity. 2. Interval development of dependent collapse/consolidation, right greater than left with small bilateral pleural effusions. 3. Soft tissue gas in the right axilla may be within venous anatomy. Correlation for venous access in the right upper extremity may prove helpful. Soft tissue gas from laceration or infection would also be a consideration. 4. Small volume perihepatic ascites. Electronically Signed   By: Misty Stanley M.D.   On: 07/20/2015 12:34        ASSESSMENT/PLAN  81 year old male admitted for hyponatremia, recent admission for hyponatremia, sodium was correcting as an outpatient, but started to decline and was readmitted, today had an acute hypoxic hypercarbic  event and had to be placed on BiPAP and transferred to the ICU, now in stable condition. Patient with acute COPD exacerbation from acute hyponatremia with encephlopathy  Pulmonary Acute on chronic hypercarbic respiratory distress - he is currently on BiPAP and tolerating it well. Plan for BiPAP 2 hours in the morning, 9 AM-11 AM, and  3 PM-5 PM, and at night minimum 4 hours with sleep; this is only for inpatient use -Patient has an outpatient sleep study scheduled in one week -Respiratory failure, hypoxic and hypercapnic, acute-resolving -Continue with BiPAP as tolerated, minimal 4 hours at night, may take breaks, may use 2 hours in the morning and 2 hours in the afternoon. -ABG as needed  RUL nodule - resolving - possible primary lung cancer. We'll consider ENB, bronchoscopy when clinically stable.  -Oncology has been consulted, he supposed to have a PET CT sometime this week -Hypercapnic respiratory failure, with hypoventilation, this appears to be chronic going back to May 2015. Suspect this is secondary to chronic hypoventilation from COPD, though neurological disease could also be a cause. -Continue with duonebs -Will follow up with Dr. 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 We'll continue H2 blocker.  HEMATOLOGIC A: RUL spiculated lung lesion - 1.5cm - resolving P:  - with resolution of lung nodule on recent CT, it could be infectious/inflammatory -Oncology consult noted.  NEUROLOGIC -follows commands    I have personally obtained a history, examined the patient, evaluated Pertinent laboratory and [employer]/imaging results, and  formulated the assessment and plan The Patient requires high complexity decision making for assessment and support, frequent evaluation and titration of therapies, application of advanced monitoring technologies and extensive interpretation of multiple databases.  Pulmonary consult time devoted to patient care services described in this note is 35 minutes.  Overall, patient is critically ill, prognosis is guarded.      Vilinda Boehringer,  MD [facility] Pager [phone] (please enter 7-digits) On Call Pager - [phone] (please enter 7-digits)

## 2015-07-21 NOTE — Progress Notes (Signed)
Subjective:  Sodium remains at 130. Patient sitting up in chair currently. Overall patient appears improved.    Objective:  Vital signs in last 24 hours:  Temp:  [98.2 F (36.8 C)-98.5 F (36.9 C)] 98.4 F (36.9 C) (10/25 0755) Pulse Rate:  [67-120] 120 (10/25 1000) Resp:  [12-30] 24 (10/25 1000) BP: (111-179)/(50-128) 135/81 mmHg (10/25 1000) SpO2:  [86 %-100 %] 92 % (10/25 1000) Weight:  [62.6 kg (138 lb 0.1 oz)] 62.6 kg (138 lb 0.1 oz) (10/25 0500)  Weight change: 0.1 kg (3.5 oz) Filed Weights   07/19/15 0500 07/20/15 0419 07/21/15 0500  Weight: 65.59 kg (144 lb 9.6 oz) 62.5 kg (137 lb 12.6 oz) 62.6 kg (138 lb 0.1 oz)    Intake/Output:    Intake/Output Summary (Last 24 hours) at 07/21/15 1123 Last data filed at 07/21/15 1000  Gross per 24 hour  Intake   2803 ml  Output   2630 ml  Net    173 ml     Physical Exam: General:  chronically ill-appearing, no acute distress   HEENT  anicteric, moist oral mucous membranes   Neck supple  Pulm/lungs  normal respiratory effort, clear to auscultation bilaterally   CVS/Heart  S1S2 no rubs  Abdomen:  Soft, NTND, BS present  Extremities:  trace b/l edema  Neurologic:  alert, oriented, speech normal, no focal deficit   Skin: No acute rashes  Access:        Basic Metabolic Panel:   Recent Labs Lab 07/15/15 1227  07/16/15 1241 07/17/15 0429 07/17/15 1347 07/18/15 0537  07/19/15 1232  07/19/15 2356 07/20/15 0613 07/20/15 1322 07/20/15 1831 07/21/15 0610  NA 124*  < > 118* 126* 122* 121*  < > 122*  < > 126* 130* 129* 128* 130*  K 4.5  --  4.1 3.6 3.9 3.7  --   --   --   --   --   --   --   --   CL 83*  --  77* 84* 84* 84*  --   --   --   --   --   --   --   --   CO2 34*  --  28 35* 30 31  --   --   --   --   --   --   --   --   GLUCOSE 197*  --  159* 112* 140* 128*  --   --   --   --   --   --   --   --   BUN 10  --  '8 8 9 10  '$ --   --   --   --   --   --   --   --   CREATININE 0.87  --  0.78 0.69 0.76 0.72  --    --   --   --   --   --   --  0.64  CALCIUM 9.3  --  8.5* 8.5* 8.1* 7.8*  --   --   --   --   --   --   --   --   MG  --   --   --   --   --   --   --  1.4*  --   --   --   --   --   --   < > = values in this interval not displayed.   CBC:  Recent Labs  Lab 07/16/15 1241  WBC 6.3  HGB 13.7  HCT 41.2  MCV 93.1  PLT 304      Microbiology:  Recent Results (from the past 720 hour(s))  Blood culture (routine x 2)     Status: None   Collection Time: 06/25/15  5:19 PM  Result Value Ref Range Status   Specimen Description BLOOD RIGHT ARM  Final   Special Requests BAA,AER5ML,ANA5ML  Final   Culture NO GROWTH 6 DAYS  Final   Report Status 07/01/2015 FINAL  Final  Blood culture (routine x 2)     Status: None   Collection Time: 06/25/15  5:57 PM  Result Value Ref Range Status   Specimen Description BLOOD RIGHT HAND  Final   Special Requests BAA, ANA5ML,AER5ML  Final   Culture NO GROWTH 6 DAYS  Final   Report Status 07/01/2015 FINAL  Final  MRSA PCR Screening     Status: None   Collection Time: 06/26/15  9:50 AM  Result Value Ref Range Status   MRSA by PCR NEGATIVE NEGATIVE Final    Comment:        The GeneXpert MRSA Assay (FDA approved for NASAL specimens only), is one component of a comprehensive MRSA colonization surveillance program. It is not intended to diagnose MRSA infection nor to guide or monitor treatment for MRSA infections.   Culture, respiratory (NON-Expectorated)     Status: None   Collection Time: 06/26/15 12:26 PM  Result Value Ref Range Status   Specimen Description TRACHEAL ASPIRATE  Final   Special Requests Normal  Final   Gram Stain   Final    FAIR SPECIMEN - 70-80% WBCS FEW WBC SEEN MODERATE GRAM POSITIVE COCCI RARE YEAST    Culture APPEARS TO BE NORMAL FLORA  Final   Report Status 06/29/2015 FINAL  Final  C difficile quick scan w PCR reflex     Status: None   Collection Time: 07/05/15  3:00 AM  Result Value Ref Range Status   C Diff  antigen NEGATIVE NEGATIVE Final   C Diff toxin NEGATIVE NEGATIVE Final   C Diff interpretation Negative for C. difficile  Final  MRSA PCR Screening     Status: None   Collection Time: 07/19/15 12:24 PM  Result Value Ref Range Status   MRSA by PCR NEGATIVE NEGATIVE Final    Comment:        The GeneXpert MRSA Assay (FDA approved for NASAL specimens only), is one component of a comprehensive MRSA colonization surveillance program. It is not intended to diagnose MRSA infection nor to guide or monitor treatment for MRSA infections.     Coagulation Studies: No results for input(s): LABPROT, INR in the last 72 hours.  Urinalysis:  Recent Labs  07/20/15 0833  COLORURINE STRAW*  LABSPEC 1.010  PHURINE 5.0  GLUCOSEU NEGATIVE  HGBUR NEGATIVE  BILIRUBINUR NEGATIVE  KETONESUR NEGATIVE  PROTEINUR NEGATIVE  NITRITE NEGATIVE  LEUKOCYTESUR NEGATIVE      Imaging: Ct Chest Wo Contrast  07/20/2015  CLINICAL DATA:  Subsequent encounter for hyponatremia and COPD exacerbation. EXAM: CT CHEST WITHOUT CONTRAST TECHNIQUE: Multidetector CT imaging of the chest was performed following the standard protocol without IV contrast. COMPARISON:  06/25/2015. FINDINGS: Mediastinum / Lymph Nodes: There is no axillary lymphadenopathy. No mediastinal lymphadenopathy. There is no hilar lymphadenopathy. Heart is borderline enlarged. Coronary artery calcification is noted. Lungs / Pleura: 1.5 cm spiculated lesion in the right upper lobe previously has decreased substantially in the interval and has nearly resolved.  Other patchy nodularity in the posterior right upper lobe has improved in the interval. Interval development of compressive atelectasis in the dependent lung bases with small bilateral pleural effusions. Superimposed infection in the right base cannot be entirely excluded. MSK / Soft Tissues: Bone windows reveal no worrisome lytic or sclerotic osseous lesions. Gas is identified in the right axillary  region and may be within venous anatomy or within the soft tissues. Upper Abdomen: Trace amount of free fluid is seen adjacent to the liver. There is some edema in the retroperitoneal fat, slightly progressed in the interval and nonspecific. IMPRESSION: 1. Interval near complete resolution of the dominant spiculated opacity in the right upper lobe with associated interval improvement in the adjacent patchy nodularity. 2. Interval development of dependent collapse/consolidation, right greater than left with small bilateral pleural effusions. 3. Soft tissue gas in the right axilla may be within venous anatomy. Correlation for venous access in the right upper extremity may prove helpful. Soft tissue gas from laceration or infection would also be a consideration. 4. Small volume perihepatic ascites. Electronically Signed   By: Misty Stanley M.D.   On: 07/20/2015 12:34   Dg Chest Port 1 View  07/20/2015  CLINICAL DATA:  PICC placement.  Initial encounter. EXAM: PORTABLE CHEST 1 VIEW COMPARISON:  Chest radiograph performed 07/16/2015 FINDINGS: The right PICC is coiled within the patient's proximal right arm. Scattered associated soft tissue air is noted. This is likely coiled within the soft tissues. The lungs are well-aerated. Minimal left basilar atelectasis is noted. There is no evidence of pleural effusion or pneumothorax. The cardiomediastinal silhouette is borderline normal in size. No acute osseous abnormalities are seen. IMPRESSION: 1. Right PICC coiled within the patient's proximal right arm, with scattered associated soft tissue air. This is likely coiled within the soft tissues, and should be removed. 2. Minimal left basilar atelectasis noted.  Lungs otherwise clear. These results were called by telephone at the time of interpretation on 07/20/2015 at 12:50 am to Nursing at the [facility] ICU, who verbally acknowledged these results. Electronically Signed   By: Garald Balding M.D.   On: 07/20/2015  00:53     Medications:   . sodium chloride 125 mL/hr at 07/21/15 0400   . acidophilus  1 capsule Oral Daily  . aspirin EC  81 mg Oral Daily  . budesonide-formoterol  2 puff Inhalation BID  . cloNIDine  0.1 mg Oral BID  . enoxaparin (LOVENOX) injection  40 mg Subcutaneous Q24H  . feeding supplement (ENSURE ENLIVE)  237 mL Oral BID BM  . furosemide  20 mg Oral Daily  . levothyroxine  50 mcg Oral QAC breakfast  . losartan  50 mg Oral Daily  . metoprolol tartrate  25 mg Oral BID  . pantoprazole  40 mg Oral QAC breakfast  . sodium chloride  3 mL Intravenous Q12H  . tamsulosin  0.4 mg Oral Daily  . tiotropium  18 mcg Inhalation Daily   sodium chloride, acetaminophen **OR** acetaminophen, albuterol, hydrALAZINE, HYDROcodone-acetaminophen, ondansetron **OR** ondansetron (ZOFRAN) IV, polyethylene glycol, sodium chloride, traMADol  Assessment/ Plan:  81 y.o. male with medical problems of COPD, and diet-controlled diabetes, DJD, hypertension, polyneuropathy, dementia who was admitted to [facility] on 06/25/2015 for evaluation of altered mental status.   1. Acute hyponatremia Likely secondary to use of hydrochlorothiazide and drinking excessive water with poor by mouth intake(tea Titus Mould) diet, also possibility of underlying SIADH - Na stable at 130, will add salt tabs to the combination of lasix  $'20mg'm$  po daily.  Will need to have sodium monitored as outpt.   2. BPH, with frequency of urination - appreciate urologist evaluation - continue flomax.  3. Lower extremity edema. -improved, may continue low dose lasix as above.     LOS: 5 Pammie Chirino 10/25/201611:23 AM

## 2015-07-21 NOTE — Progress Notes (Addendum)
[place] [facility] [place] NAME: Aaron Hendrix    MR#:  [id number]  DATE OF BIRTH:  1930-05-14    CHIEF COMPLAINT:   Chief Complaint  Patient presents with  . abnormal labs   feeling much better. Na 130, not requiring much BIPAP REVIEW OF SYSTEMS:  ROS CONSTITUTIONAL: No fever, fatigue or weakness.  EYES: No blurred or double vision.  EARS, NOSE, AND THROAT: No tinnitus or ear pain.  RESPIRATORY: No cough, shortness of breath, wheezing or hemoptysis.  CARDIOVASCULAR: No chest pain, orthopnea, edema.  GASTROINTESTINAL: No nausea, vomiting, diarrhea or abdominal pain.  GENITOURINARY: No dysuria, hematuria.  ENDOCRINE: No polyuria, nocturia,  HEMATOLOGY: No anemia, easy bruising or bleeding SKIN: No rash or lesion. MUSCULOSKELETAL: No joint pain or arthritis.  Generalized weakness NEUROLOGIC: No tingling, numbness, weakness.  PSYCHIATRY: No anxiety or depression. DRUG ALLERGIES:   Allergies  Allergen Reactions  . Fexofenadine Other (See Comments)  . Quinapril Hcl Other (See Comments)    Unknown   . Latex Rash   VITALS:  Blood pressure 135/81, pulse 69, temperature 98.8 F (37.1 C), temperature source Oral, resp. rate 24, height '5\' 4"'$  (1.626 m), weight 62.6 kg (138 lb 0.1 oz), SpO2 100 %. PHYSICAL EXAMINATION:  GENERAL:  81 y.o.-year-old patient lying in the bed with no acute distress.  EYES: Pupils equal, round, reactive to light and accommodation. No scleral icterus. Extraocular muscles intact.  HEENT: Head atraumatic, normocephalic. Oropharynx and nasopharynx clear.  NECK:  Supple, no jugular venous distention. No thyroid enlargement, no tenderness.  LUNGS: Normal breath sounds bilaterally, no wheezing, rales,rhonchi or crepitation. No use of accessory muscles of respiration.  CARDIOVASCULAR: S1, S2 normal. No murmurs, rubs, or gallops.  ABDOMEN: Soft, nontender, nondistended. Bowel sounds present. No organomegaly or mass.   EXTREMITIES: No pedal edema, cyanosis, or clubbing.  NEUROLOGIC: Cranial nerves II through XII are intact. Muscle strength 5/5 in all extremities. Sensation intact. Gait not checked.  PSYCHIATRIC: The patient is alert and oriented x 3.  SKIN: No obvious rash, lesion, or ulcer.  LABORATORY PANEL:   CBC  Recent Labs Lab 07/16/15 1241  WBC 6.3  HGB 13.7  HCT 41.2  PLT 304   ------------------------------------------------------------------------------------------------------------------  Chemistries   Recent Labs Lab 07/18/15 0537  07/19/15 1232  07/21/15 0610  NA 121*  < > 122*  < > 130*  K 3.7  --   --   --   --   CL 84*  --   --   --   --   CO2 31  --   --   --   --   GLUCOSE 128*  --   --   --   --   BUN 10  --   --   --   --   CREATININE 0.72  --   --   --  0.64  CALCIUM 7.8*  --   --   --   --   MG  --   --  1.4*  --   --   < > = values in this interval not displayed.  RADIOLOGY:  Ct Chest Wo Contrast  07/20/2015  CLINICAL DATA:  Subsequent encounter for hyponatremia and COPD exacerbation. EXAM: CT CHEST WITHOUT CONTRAST TECHNIQUE: Multidetector CT imaging of the chest was performed following the standard protocol without IV contrast. COMPARISON:  06/25/2015. FINDINGS: Mediastinum / Lymph Nodes: There is no axillary lymphadenopathy. No mediastinal lymphadenopathy. There is no hilar  lymphadenopathy. Heart is borderline enlarged. Coronary artery calcification is noted. Lungs / Pleura: 1.5 cm spiculated lesion in the right upper lobe previously has decreased substantially in the interval and has nearly resolved. Other patchy nodularity in the posterior right upper lobe has improved in the interval. Interval development of compressive atelectasis in the dependent lung bases with small bilateral pleural effusions. Superimposed infection in the right base cannot be entirely excluded. MSK / Soft Tissues: Bone windows reveal no worrisome lytic or sclerotic osseous lesions. Gas is  identified in the right axillary region and may be within venous anatomy or within the soft tissues. Upper Abdomen: Trace amount of free fluid is seen adjacent to the liver. There is some edema in the retroperitoneal fat, slightly progressed in the interval and nonspecific. IMPRESSION: 1. Interval near complete resolution of the dominant spiculated opacity in the right upper lobe with associated interval improvement in the adjacent patchy nodularity. 2. Interval development of dependent collapse/consolidation, right greater than left with small bilateral pleural effusions. 3. Soft tissue gas in the right axilla may be within venous anatomy. Correlation for venous access in the right upper extremity may prove helpful. Soft tissue gas from laceration or infection would also be a consideration. 4. Small volume perihepatic ascites. Electronically Signed   By: Misty Stanley M.D.   On: 07/20/2015 12:34   Dg Chest Port 1 View  07/20/2015  CLINICAL DATA:  PICC placement.  Initial encounter. EXAM: PORTABLE CHEST 1 VIEW COMPARISON:  Chest radiograph performed 07/16/2015 FINDINGS: The right PICC is coiled within the patient's proximal right arm. Scattered associated soft tissue air is noted. This is likely coiled within the soft tissues. The lungs are well-aerated. Minimal left basilar atelectasis is noted. There is no evidence of pleural effusion or pneumothorax. The cardiomediastinal silhouette is borderline normal in size. No acute osseous abnormalities are seen. IMPRESSION: 1. Right PICC coiled within the patient's proximal right arm, with scattered associated soft tissue air. This is likely coiled within the soft tissues, and should be removed. 2. Minimal left basilar atelectasis noted.  Lungs otherwise clear. These results were called by telephone at the time of interpretation on 07/20/2015 at 12:50 am to Nursing at the [facility] ICU, who verbally acknowledged these results. Electronically Signed   By:  Garald Balding M.D.   On: 07/20/2015 00:53   ASSESSMENT AND PLAN:   * Hyponatremia Likely from SIADH and low solute load. Contributed by hydrochlorothiazide. Sodium is stable 130 today. Nephrology added salt tabs to the combination of lasix '20mg'$  po daily  * BPH Appreciate urology input Flomax started. Outpatient urology follow-up.  * Right lung nodule: CT SHOWED dependent collapse/consolidation, right greater than left with small bilateral pleural effusions. No mass  * diabetes mellitus diet controlled  * hypertension  Hydrochlorothiazide stopped due to hyponatremia  metoprolol, losartan, Norvasc, clonidine. Stopped Norvasc due to low normal blood pressure.  * history of COPD: No wheezing at this time continue Spiriva.  * Insomnia continue Ambien 5 mg as needed at night.  Plan was discussed in detail with him. All questions answered. He is agreeable with rehab although per PT's he was refusing earlier.  CODE STATUS: full  TOTAL TIME TAKING CARE OF THIS PATIENT: 35 minutes.   Greater than 50% time spent in discussing with patient and family at bedside.   POSSIBLE D/C IN AM, DEPENDING ON CLINICAL CONDITION. Transfer to floors today   [facility], Adrina Armijo M.D on 07/21/2015 at 11:25 AM  Between 7am to 6pm -  Pager - [phone]  After 6pm go to [email or web address] - password [email or web address] [place] [facility]  Office  [phone]  CC: Primary care physician; [name], MD

## 2015-07-22 ENCOUNTER — Telehealth: Payer: Self-pay | Admitting: Internal Medicine

## 2015-07-22 ENCOUNTER — Ambulatory Visit: Payer: Medicare Other

## 2015-07-22 ENCOUNTER — Ambulatory Visit: Payer: TRICARE For Life (TFL)

## 2015-07-22 LAB — COMPREHENSIVE METABOLIC PANEL
ALBUMIN: 2.9 g/dL — AB (ref 3.5–5.0)
ALK PHOS: 61 U/L (ref 38–126)
ALT: 8 U/L — AB (ref 17–63)
AST: 15 U/L (ref 15–41)
Anion gap: 3 — ABNORMAL LOW (ref 5–15)
BILIRUBIN TOTAL: 0.2 mg/dL — AB (ref 0.3–1.2)
BUN: 17 mg/dL (ref 6–20)
CALCIUM: 8.5 mg/dL — AB (ref 8.9–10.3)
CO2: 37 mmol/L — AB (ref 22–32)
Chloride: 91 mmol/L — ABNORMAL LOW (ref 101–111)
Creatinine, Ser: 0.71 mg/dL (ref 0.61–1.24)
GFR calc non Af Amer: >60 mL/min (ref >60)
GLUCOSE: 107 mg/dL — AB (ref 65–99)
Potassium: 4.4 mmol/L (ref 3.5–5.1)
SODIUM: 131 mmol/L — AB (ref 135–145)
TOTAL PROTEIN: 5.2 g/dL — AB (ref 6.5–8.1)

## 2015-07-22 LAB — CBC
HEMATOCRIT: 34.2 % — AB (ref 40.0–52.0)
HEMOGLOBIN: 11 g/dL — AB (ref 13.0–18.0)
MCH: 30.3 pg (ref 26.0–34.0)
MCHC: 32.2 g/dL (ref 32.0–36.0)
MCV: 94.2 fL (ref 80.0–100.0)
Platelets: 271 10*3/uL (ref 150–440)
RBC: 3.63 MIL/uL — AB (ref 4.40–5.90)
RDW: 13 % (ref 11.5–14.5)
WBC: 5.7 10*3/uL (ref 3.8–10.6)

## 2015-07-22 LAB — GLUCOSE, CAPILLARY: Glucose-Capillary: 95 mg/dL (ref 65–99)

## 2015-07-22 MED ORDER — FLUDEOXYGLUCOSE F - 18 (FDG) INJECTION
12.2600 | Freq: Once | INTRAVENOUS | Status: DC | PRN
  Administered 2015-07-22: 12.26 via INTRAVENOUS
  Filled 2015-07-22: qty 12.26

## 2015-07-22 MED ORDER — FUROSEMIDE 20 MG PO TABS: 20.0000 mg | ORAL_TABLET | Freq: Every day | ORAL | Status: DC

## 2015-07-22 MED ORDER — LOSARTAN POTASSIUM 50 MG PO TABS: 50.0000 mg | ORAL_TABLET | Freq: Every day | ORAL | Status: DC

## 2015-07-22 MED ORDER — SODIUM CHLORIDE 1 G PO TABS: 1.0000 g | ORAL_TABLET | Freq: Two times a day (BID) | ORAL | Status: DC

## 2015-07-22 NOTE — Care Management (Addendum)
Discharge to home today per Home Health. Resumption of orders for [name]. All information  ( face sheet, Home Health, Face-to-face) faxed to [name]. Son and wife will transport per private car. Shelbie Ammons RN MSN CCM Care Management [phone]

## 2015-07-22 NOTE — Care Management Important Message (Signed)
Important Message  Patient Details  Name: Aaron Hendrix MRN: [id number] Date of Birth: Feb 08, 1930   Medicare Important Message Given:  Yes-second notification given    Shelbie Ammons, RN 07/22/2015, 8:02 AM

## 2015-07-22 NOTE — Progress Notes (Signed)
Subjective:  No new sodium available this AM. Due for PET scan.  Resting comfortably.  Started on salt tabs yesterday.   Objective:  Vital signs in last 24 hours:  Temp:  [97.7 F (36.5 C)-99.3 F (37.4 C)] 97.7 F (36.5 C) (10/26 0500) Pulse Rate:  [62-120] 62 (10/26 0500) Resp:  [16-24] 18 (10/26 0500) BP: (114-135)/(46-81) 131/60 mmHg (10/26 0500) SpO2:  [86 %-100 %] 100 % (10/26 0500) Weight:  [62.097 kg (136 lb 14.4 oz)] 62.097 kg (136 lb 14.4 oz) (10/26 0307)  Weight change: -0.503 kg (-1 lb 1.7 oz) Filed Weights   07/20/15 0419 07/21/15 0500 07/22/15 0307  Weight: 62.5 kg (137 lb 12.6 oz) 62.6 kg (138 lb 0.1 oz) 62.097 kg (136 lb 14.4 oz)    Intake/Output:    Intake/Output Summary (Last 24 hours) at 07/22/15 0918 Last data filed at 07/21/15 2135  Gross per 24 hour  Intake    490 ml  Output    725 ml  Net   -235 ml     Physical Exam: General:  chronically ill-appearing, no acute distress   HEENT  anicteric, moist oral mucous membranes   Neck supple  Pulm/lungs  normal respiratory effort, clear to auscultation bilaterally   CVS/Heart  S1S2 no rubs  Abdomen:  Soft, NTND, BS present  Extremities:  trace b/l edema  Neurologic:  alert, oriented, speech normal, no focal deficit   Skin: No acute rashes  Access:        Basic Metabolic Panel:   Recent Labs Lab 07/15/15 1227  07/16/15 1241 07/17/15 0429 07/17/15 1347 07/18/15 0537  07/19/15 1232  07/19/15 2356 07/20/15 0613 07/20/15 1322 07/20/15 1831 07/21/15 0610  NA 124*  < > 118* 126* 122* 121*  < > 122*  < > 126* 130* 129* 128* 130*  K 4.5  --  4.1 3.6 3.9 3.7  --   --   --   --   --   --   --   --   CL 83*  --  77* 84* 84* 84*  --   --   --   --   --   --   --   --   CO2 34*  --  28 35* 30 31  --   --   --   --   --   --   --   --   GLUCOSE 197*  --  159* 112* 140* 128*  --   --   --   --   --   --   --   --   BUN 10  --  '8 8 9 10  '$ --   --   --   --   --   --   --   --   CREATININE 0.87  --   0.78 0.69 0.76 0.72  --   --   --   --   --   --   --  0.64  CALCIUM 9.3  --  8.5* 8.5* 8.1* 7.8*  --   --   --   --   --   --   --   --   MG  --   --   --   --   --   --   --  1.4*  --   --   --   --   --   --   < > = values in this interval not  displayed.   CBC:  Recent Labs Lab 07/16/15 1241 07/22/15 0458  WBC 6.3 5.7  HGB 13.7 11.0*  HCT 41.2 34.2*  MCV 93.1 94.2  PLT 304 271      Microbiology:  Recent Results (from the past 720 hour(s))  Blood culture (routine x 2)     Status: None   Collection Time: 06/25/15  5:19 PM  Result Value Ref Range Status   Specimen Description BLOOD RIGHT ARM  Final   Special Requests BAA,[id number],ANA5ML  Final   Culture NO GROWTH 6 DAYS  Final   Report Status 07/01/2015 FINAL  Final  Blood culture (routine x 2)     Status: None   Collection Time: 06/25/15  5:57 PM  Result Value Ref Range Status   Specimen Description BLOOD RIGHT HAND  Final   Special Requests BAA, [id number],[id number]  Final   Culture NO GROWTH 6 DAYS  Final   Report Status 07/01/2015 FINAL  Final  MRSA PCR Screening     Status: None   Collection Time: 06/26/15  9:50 AM  Result Value Ref Range Status   MRSA by PCR NEGATIVE NEGATIVE Final    Comment:        The GeneXpert MRSA Assay (FDA approved for NASAL specimens only), is one component of a comprehensive MRSA colonization surveillance program. It is not intended to diagnose MRSA infection nor to guide or monitor treatment for MRSA infections.   Culture, respiratory (NON-Expectorated)     Status: None   Collection Time: 06/26/15 12:26 PM  Result Value Ref Range Status   Specimen Description TRACHEAL ASPIRATE  Final   Special Requests Normal  Final   Gram Stain   Final    FAIR SPECIMEN - 70-80% WBCS FEW WBC SEEN MODERATE GRAM POSITIVE COCCI RARE YEAST    Culture APPEARS TO BE NORMAL FLORA  Final   Report Status 06/29/2015 FINAL  Final  C difficile quick scan w PCR reflex     Status: None   Collection Time:  07/05/15  3:00 AM  Result Value Ref Range Status   C Diff antigen NEGATIVE NEGATIVE Final   C Diff toxin NEGATIVE NEGATIVE Final   C Diff interpretation Negative for C. difficile  Final  MRSA PCR Screening     Status: None   Collection Time: 07/19/15 12:24 PM  Result Value Ref Range Status   MRSA by PCR NEGATIVE NEGATIVE Final    Comment:        The GeneXpert MRSA Assay (FDA approved for NASAL specimens only), is one component of a comprehensive MRSA colonization surveillance program. It is not intended to diagnose MRSA infection nor to guide or monitor treatment for MRSA infections.     Coagulation Studies: No results for input(s): LABPROT, INR in the last 72 hours.  Urinalysis:  Recent Labs  07/20/15 0833  COLORURINE STRAW*  LABSPEC 1.010  PHURINE 5.0  GLUCOSEU NEGATIVE  HGBUR NEGATIVE  BILIRUBINUR NEGATIVE  KETONESUR NEGATIVE  PROTEINUR NEGATIVE  NITRITE NEGATIVE  LEUKOCYTESUR NEGATIVE      Imaging: Ct Chest Wo Contrast  07/20/2015  CLINICAL DATA:  Subsequent encounter for hyponatremia and COPD exacerbation. EXAM: CT CHEST WITHOUT CONTRAST TECHNIQUE: Multidetector CT imaging of the chest was performed following the standard protocol without IV contrast. COMPARISON:  06/25/2015. FINDINGS: Mediastinum / Lymph Nodes: There is no axillary lymphadenopathy. No mediastinal lymphadenopathy. There is no hilar lymphadenopathy. Heart is borderline enlarged. Coronary artery calcification is noted. Lungs / Pleura: 1.5 cm spiculated lesion in the  right upper lobe previously has decreased substantially in the interval and has nearly resolved. Other patchy nodularity in the posterior right upper lobe has improved in the interval. Interval development of compressive atelectasis in the dependent lung bases with small bilateral pleural effusions. Superimposed infection in the right base cannot be entirely excluded. MSK / Soft Tissues: Bone windows reveal no worrisome lytic or sclerotic  osseous lesions. Gas is identified in the right axillary region and may be within venous anatomy or within the soft tissues. Upper Abdomen: Trace amount of free fluid is seen adjacent to the liver. There is some edema in the retroperitoneal fat, slightly progressed in the interval and nonspecific. IMPRESSION: 1. Interval near complete resolution of the dominant spiculated opacity in the right upper lobe with associated interval improvement in the adjacent patchy nodularity. 2. Interval development of dependent collapse/consolidation, right greater than left with small bilateral pleural effusions. 3. Soft tissue gas in the right axilla may be within venous anatomy. Correlation for venous access in the right upper extremity may prove helpful. Soft tissue gas from laceration or infection would also be a consideration. 4. Small volume perihepatic ascites. Electronically Signed   By: Misty Stanley M.D.   On: 07/20/2015 12:34     Medications:     . acidophilus  1 capsule Oral Daily  . aspirin EC  81 mg Oral Daily  . budesonide-formoterol  2 puff Inhalation BID  . cloNIDine  0.1 mg Oral BID  . enoxaparin (LOVENOX) injection  40 mg Subcutaneous Q24H  . feeding supplement (ENSURE ENLIVE)  237 mL Oral BID BM  . furosemide  20 mg Oral Daily  . levothyroxine  50 mcg Oral QAC breakfast  . losartan  50 mg Oral Daily  . metoprolol tartrate  25 mg Oral BID  . pantoprazole  40 mg Oral QAC breakfast  . sodium chloride  3 mL Intravenous Q12H  . sodium chloride  1 g Oral BID WC  . tamsulosin  0.4 mg Oral Daily  . tiotropium  18 mcg Inhalation Daily   sodium chloride, acetaminophen **OR** acetaminophen, albuterol, fludeoxyglucose F - 18, hydrALAZINE, HYDROcodone-acetaminophen, ondansetron **OR** ondansetron (ZOFRAN) IV, polyethylene glycol, sodium chloride, traMADol  Assessment/ Plan:  81 y.o. male with medical problems of COPD, and diet-controlled diabetes, DJD, hypertension, polyneuropathy, dementia who was  ad[facility] to [facility] for evaluation of altered mental status.   1. Acute hyponatremia Likely secondary to use of hydrochlorothiazide and drinking excessive water with poor by mouth intake(tea Titus Mould) diet, also possibility of underlying SIADH - No new Na this AM, last Na was 130.  -Continue salt tabs and lasix as outpt.  2. BPH, with frequency of urination - continue flomax  3. Lower extremity edema. -continue lasix '20mg'$  po daily.    LOS: 6 Brittanni Cariker 10/26/20169:18 AM

## 2015-07-22 NOTE — Plan of Care (Addendum)
Problem: Discharge Progression Outcomes Goal: Other Discharge Outcomes/Goals Outcome: Completed/Met Date Met:  07/22/15 VSS. Pt alert to self and place. Discharge home with home health. Discharge instructions given and explained to pt and family. Follow up appointments scheduled. New prescriptions sent to pharmacy. Handouts about hyponatremia given and explained to pt and family.

## 2015-07-22 NOTE — Discharge Instructions (Signed)
Hyponatremia Hyponatremia is when the amount of salt (sodium) in your blood is too low. When salt levels are low, your cells absorb extra water and they swell. The swelling happens throughout the body, but it mostly affects the brain.  HOME CARE  Take medicines only as told by your doctor. Many medicines can make this condition worse. Talk with your doctor about any medicines that you are currently taking.  Carefully follow a recommended diet as told by your doctor.  Carefully follow instructions from your doctor about fluid restrictions.  Keep all follow-up visits as told by your doctor. This is important.  Do not drink alcohol. GET HELP IF:  You feel sicker to your stomach (nauseous).  You feel more confused.  You feel more tired (fatigued).  Your headache gets worse.  You feel weaker.  Your symptoms go away and then they come back.  You have trouble following the diet instructions. GET HELP RIGHT AWAY IF:  You start to twitch and shake (have a seizure).  You pass out (faint).  You keep having watery poop (diarrhea).  You keep throwing up (vomiting).   This information is not intended to replace advice given to you by your health care provider. Make sure you discuss any questions you have with your health care provider.   Document Released: 05/25/2011 Document Revised: 01/27/2015 Document Reviewed: 09/08/2014 Elsevier Interactive Patient Education [employer].

## 2015-07-22 NOTE — Plan of Care (Signed)
Problem: Discharge Progression Outcomes Goal: Other Discharge Outcomes/Goals Outcome: Progressing Plan of care progress to goal: VSS Pt on Bipap and tolerating well To have PET scan this am...NPO since midnight. 1+ pitting edema bilateral legs Pt remained in bed and incontinent

## 2015-07-22 NOTE — Telephone Encounter (Signed)
HFU pt is being discharged today. Diagnosis is Hyponatremia. Pt is scheduled for 07/24/2015 '@9am'$ . Thank you!

## 2015-07-22 NOTE — Telephone Encounter (Signed)
Noted  

## 2015-07-23 ENCOUNTER — Telehealth: Payer: Self-pay | Admitting: *Deleted

## 2015-07-23 ENCOUNTER — Telehealth: Payer: Self-pay

## 2015-07-23 NOTE — Telephone Encounter (Signed)
I do not have an opening on 07/29/15.  I can see him at 12::00 on 08/04/15 or possibly on 07/31/15 at 12:00.   Let me know if this is a problem.

## 2015-07-23 NOTE — Telephone Encounter (Signed)
Patient's wife cancelled appt for 10/28 at 9am. She stated that patient is very weak and receiving physical therapy. Patient's wife requested that she bring him in 07/29/15, during morning hours. Please advise where to place him on the schedule.

## 2015-07-23 NOTE — Telephone Encounter (Signed)
Transition Care Management Follow-up Telephone Call   Date discharged? 07/22/15   How have you been since you were released from the hospital? Doing good, gathering strength, sleeping ok, eating, no dizziness or headache and using bathroom with x2 BM.     Do you understand why you were in the hospital? Yes wife and family helped to explain.  Do you understand the discharge instructions? Yes wife and family helped to explain.   Where were you discharged to? Home. We are waiting for the home health care to start.   Items Reviewed:  Medications reviewed: Yes and daughter [name]) is a pharmacist so she helps keep [place] organized. No change to medications.  Allergies reviewed: Yes, all ok. No changes.  Dietary changes reviewed: Yes, diabetic diet controlled.  Referrals reviewed: Yes, appointments made.   Functional Questionnaire:   Activities of Daily Living (ADLs):   He states they are independent in the following: self feeds, grooming. States they require assistance with the following: Ambulating (uses walker), dresses, bath, meal preparation.  Home health to start this week and wife currently assists when needed.  Any transportation issues/concerns?: No, the family will help [place] get there.   Any patient concerns? No, not at this time.   Confirmed importance and date/time of follow-up visits scheduled Yes, appointment rescheduled for 07-31-15 at noon.  Provider Appointment booked with Dr. Nicki Reaper, (PCP)  Confirmed with patient if condition begins to worsen call PCP or go to the ER.  Patient was given the office number and encouraged to call back with question or concerns.  : Yes, wife Vira Agar) verbalized understanding.

## 2015-07-23 NOTE — Telephone Encounter (Signed)
Dr. Nicki Reaper please advise? thanks

## 2015-07-23 NOTE — Telephone Encounter (Signed)
Scheduled for November 4th at 1200.

## 2015-07-24 ENCOUNTER — Ambulatory Visit: Payer: Medicare Other | Admitting: Internal Medicine

## 2015-07-24 ENCOUNTER — Telehealth: Payer: Self-pay | Admitting: Internal Medicine

## 2015-07-24 ENCOUNTER — Other Ambulatory Visit: Payer: Self-pay

## 2015-07-24 NOTE — Telephone Encounter (Signed)
Pt wife called and states that pt does not have his Flomax prescription. Wife states he has been urinating a lot today. Pharmacy is [facility] in [place]. Thank you!

## 2015-07-24 NOTE — Telephone Encounter (Signed)
Per our discussion refill advise? thanks

## 2015-07-25 MED ORDER — TAMSULOSIN HCL 0.4 MG PO CAPS: 0.4000 mg | ORAL_CAPSULE | Freq: Every day | ORAL | Status: DC

## 2015-07-25 NOTE — Telephone Encounter (Signed)
Ok to refill,  Refill sent  

## 2015-07-27 ENCOUNTER — Ambulatory Visit: Payer: Medicare Other | Attending: Pulmonary Disease

## 2015-07-27 DIAGNOSIS — G4733 Obstructive sleep apnea (adult) (pediatric): Secondary | ICD-10-CM | POA: Diagnosis not present

## 2015-07-27 DIAGNOSIS — G4761 Periodic limb movement disorder: Secondary | ICD-10-CM | POA: Diagnosis not present

## 2015-07-29 ENCOUNTER — Inpatient Hospital Stay: Payer: Medicare Other | Attending: Oncology | Admitting: Oncology

## 2015-07-29 ENCOUNTER — Encounter: Payer: Self-pay | Admitting: Oncology

## 2015-07-29 VITALS — BP 104/46 | HR 69 | Temp 96.7°F | Wt 136.0 lb

## 2015-07-29 DIAGNOSIS — R918 Other nonspecific abnormal finding of lung field: Secondary | ICD-10-CM

## 2015-07-29 DIAGNOSIS — J449 Chronic obstructive pulmonary disease, unspecified: Secondary | ICD-10-CM

## 2015-07-29 DIAGNOSIS — I10 Essential (primary) hypertension: Secondary | ICD-10-CM | POA: Diagnosis not present

## 2015-07-29 DIAGNOSIS — J90 Pleural effusion, not elsewhere classified: Secondary | ICD-10-CM

## 2015-07-29 DIAGNOSIS — Z79899 Other long term (current) drug therapy: Secondary | ICD-10-CM

## 2015-07-29 DIAGNOSIS — Z87891 Personal history of nicotine dependence: Secondary | ICD-10-CM | POA: Diagnosis not present

## 2015-07-29 DIAGNOSIS — K920 Hematemesis: Secondary | ICD-10-CM | POA: Insufficient documentation

## 2015-07-29 DIAGNOSIS — K567 Ileus, unspecified: Secondary | ICD-10-CM | POA: Insufficient documentation

## 2015-07-29 DIAGNOSIS — E1140 Type 2 diabetes mellitus with diabetic neuropathy, unspecified: Secondary | ICD-10-CM

## 2015-07-29 DIAGNOSIS — I509 Heart failure, unspecified: Secondary | ICD-10-CM | POA: Diagnosis not present

## 2015-07-29 DIAGNOSIS — E785 Hyperlipidemia, unspecified: Secondary | ICD-10-CM | POA: Diagnosis not present

## 2015-07-29 DIAGNOSIS — E871 Hypo-osmolality and hyponatremia: Secondary | ICD-10-CM

## 2015-07-29 NOTE — Progress Notes (Signed)
Patient here for PET results.  Placed yellow bracelet on patient and educated him to wear it to each appointment , even labs only.

## 2015-07-30 ENCOUNTER — Encounter: Payer: Self-pay | Admitting: Pulmonary Disease

## 2015-07-30 ENCOUNTER — Other Ambulatory Visit
Admission: RE | Admit: 2015-07-30 | Discharge: 2015-07-30 | Disposition: A | Discharge status: Home / Self Care | Payer: Medicare Other | Source: Ambulatory Visit | Attending: Pulmonary Disease | Admitting: Pulmonary Disease

## 2015-07-30 ENCOUNTER — Ambulatory Visit (INDEPENDENT_AMBULATORY_CARE_PROVIDER_SITE_OTHER): Payer: Medicare Other | Admitting: Pulmonary Disease

## 2015-07-30 VITALS — BP 102/64 | HR 84 | Ht 64.0 in | Wt 139.0 lb

## 2015-07-30 DIAGNOSIS — R0689 Other abnormalities of breathing: Secondary | ICD-10-CM | POA: Diagnosis not present

## 2015-07-30 DIAGNOSIS — J449 Chronic obstructive pulmonary disease, unspecified: Secondary | ICD-10-CM | POA: Diagnosis not present

## 2015-07-30 DIAGNOSIS — R911 Solitary pulmonary nodule: Secondary | ICD-10-CM | POA: Diagnosis not present

## 2015-07-30 DIAGNOSIS — E871 Hypo-osmolality and hyponatremia: Secondary | ICD-10-CM

## 2015-07-30 LAB — BASIC METABOLIC PANEL
Anion gap: 5 (ref 5–15)
BUN: 16 mg/dL (ref 6–20)
CALCIUM: 8.8 mg/dL — AB (ref 8.9–10.3)
CHLORIDE: 91 mmol/L — AB (ref 101–111)
CO2: 33 mmol/L — ABNORMAL HIGH (ref 22–32)
CREATININE: 0.91 mg/dL (ref 0.61–1.24)
Glucose, Bld: 124 mg/dL — ABNORMAL HIGH (ref 65–99)
Potassium: 4 mmol/L (ref 3.5–5.1)
SODIUM: 129 mmol/L — AB (ref 135–145)

## 2015-07-30 NOTE — Progress Notes (Addendum)
BACKGROUND: Hospitalized 9/29 - 10/10 initially with AMS and hyponatremia. Developed progressive hypersomnolence and was found to be profoundly hypercarbic (PaCO2 162 torr!!). Underwent intubation and mechanical ventilation 9/30 - 07/01/15. PCCM was involved in his care during the time of his time in the ICU. It was felt that the hypercarbic resp failure was due to COPD and he was discharged home on bronchodilator therapy as documented. Pulmonary follow up was arranged. His hospitalization was c/b hypertension and he was eventually discharged on 3 anti-hypertensive medications. On his initial CXR, there was concern for a RUL nodule which was confirmed by CT chest.   INTERVAL HISTORY: Again hospitalized 10/20-10/26/16 for weakness and hyponatremia. Seen in consultation by Dr Stevenson Clinch No new respiratory symptoms  OBJ: Filed Vitals:   07/30/15 0930  BP: 102/64  Pulse: 84  Height: '5\' 4"'$  (1.626 m)  Weight: 139 lb (63.05 kg)  SpO2: 99%   Frail appearing, no respiratory distress HEENT WNL No JVD noted BS markedly diminished without adventitious sounds, no wheezing Reg, no M noted Abd soft, NT, +BS Ext with 3+ RLE pitting edema, 2+ pitting LLE edema Neuro: diffusely weak, no focal deficits  Outpatient Encounter Prescriptions as of 07/30/2015  Medication Sig  . acetaminophen (TYLENOL) 500 MG tablet Take 500 mg by mouth every 6 (six) hours as needed.  Marland Kitchen albuterol (PROVENTIL) (2.5 MG/3ML) 0.083% nebulizer solution Take 3 mLs (2.5 mg total) by nebulization every 6 (six) hours as needed for wheezing or shortness of breath.  Mariane Baumgarten Calcium (STOOL SOFTENER PO) Take by mouth.  . feeding supplement, ENSURE ENLIVE, (ENSURE ENLIVE) LIQD Take 237 mLs by mouth 2 (two) times daily between meals.  . Fluticasone Furoate-Vilanterol (BREO ELLIPTA) 100-25 MCG/INH AEPB Inhale 1 puff into the lungs daily.  . furosemide (LASIX) 20 MG tablet Take 1 tablet (20 mg total) by mouth daily.  Marland Kitchen levothyroxine (SYNTHROID,  LEVOTHROID) 50 MCG tablet Take 50 mcg by mouth daily before breakfast.  . losartan (COZAAR) 50 MG tablet Take 1 tablet (50 mg total) by mouth daily.  . metoprolol tartrate (LOPRESSOR) 25 MG tablet Take 1 tablet (25 mg total) by mouth 2 (two) times daily.  . pantoprazole (PROTONIX) 40 MG tablet Take 40 mg by mouth daily.  Marland Kitchen saccharomyces boulardii (FLORASTOR) 250 MG capsule Take 250 mg by mouth as needed.   . sodium chloride 1 G tablet Take 1 tablet (1 g total) by mouth 2 (two) times daily with a meal.  . tamsulosin (FLOMAX) 0.4 MG CAPS capsule Take 1 capsule (0.4 mg total) by mouth daily.  Marland Kitchen tiotropium (SPIRIVA) 18 MCG inhalation capsule Place 1 capsule (18 mcg total) into inhaler and inhale daily.  . traMADol (ULTRAM) 50 MG tablet Take by mouth at bedtime as needed.  . zolpidem (AMBIEN) 5 MG tablet Take 5 mg by mouth at bedtime as needed for sleep.  . [DISCONTINUED] amLODipine (NORVASC) 5 MG tablet Take 1 tablet (5 mg total) by mouth daily. (Patient not taking: Reported on 07/30/2015)  . [DISCONTINUED] budesonide-formoterol (SYMBICORT) 160-4.5 MCG/ACT inhaler Inhale 2 puffs into the lungs 2 (two) times daily.  . [DISCONTINUED] cloNIDine (CATAPRES) 0.1 MG tablet Take 1 tablet (0.1 mg total) by mouth 2 (two) times daily. (Patient not taking: Reported on 07/30/2015)  . [DISCONTINUED] Fluticasone Furoate-Vilanterol 100-25 MCG/INH AEPB Inhale 1 puff into the lungs daily. (Patient not taking: Reported on 07/30/2015)  . [DISCONTINUED] ipratropium (ATROVENT) 0.06 % nasal spray Place 2 sprays into the nose daily as needed.    No facility-administered  encounter medications on file as of 07/30/2015.   DATA: BMP Latest Ref Rng 07/22/2015 07/21/2015 07/20/2015  Glucose 65 - 99 mg/dL 107(H) - -  BUN 6 - 20 mg/dL 17 - -  Creatinine 0.61 - 1.24 mg/dL 0.71 0.64 -  Sodium 135 - 145 mmol/L 131(L) 130(L) 128(L)  Potassium 3.5 - 5.1 mmol/L 4.4 - -  Chloride 101 - 111 mmol/L 91(L) - -  CO2 22 - 32 mmol/L 37(H) - -   Calcium 8.9 - 10.3 mg/dL 8.5(L) - -    CBC Latest Ref Rng 07/22/2015 07/16/2015 07/12/2015  WBC 3.8 - 10.6 K/uL 5.7 6.3 7.0  Hemoglobin 13.0 - 18.0 g/dL 11.0(L) 13.7 12.8(L)  Hematocrit 40.0 - 52.0 % 34.2(L) 41.2 40.3  Platelets 150 - 440 K/uL 271 304 280   CXR (10/06): NACPD  CT chest (07/20/15): RUL "nodule" smaller than previously noted in Sept PET 07/22/15: 1. No specific findings identified to suggest hypermetabolic/FDG avid tumor. Continued resolution of right upper lobe pulmonary nodule which currently measures 1.3 cm and exhibits non malignant range FDG uptake  Spirometry 07/30/15: FVC  2.09 73%      FEV1  1.47 68%    FEV1%70%   PSG pending   IMPRESSION/PLAN: 1) Chronic hypercarbic respiratory failure. Suspected COPD with recent acute exacerbation. He has notably minimal smoking history to explain such severe manifestations. There is no emphysema on CT chest but exam is consistent with impaired airflow.   - Cont Breo 100/25. One inhalation daily  - Continue Spiriva  - Cont albuterol nebulizer PRN  2) RUL nodule - This is benign and requires no further evaluation  3) LE edema - stable  - LE venous Doppler today   4) Recurrent hyponatremia - likely related to thiazide (was on Hyzaar, changed to Losartan)  - Recheck BMET  ROV 3 months or PRN.  [name], MD [facility]

## 2015-07-31 ENCOUNTER — Ambulatory Visit (INDEPENDENT_AMBULATORY_CARE_PROVIDER_SITE_OTHER): Payer: Medicare Other | Admitting: Internal Medicine

## 2015-07-31 ENCOUNTER — Telehealth: Payer: Self-pay | Admitting: *Deleted

## 2015-07-31 ENCOUNTER — Encounter: Payer: Self-pay | Admitting: Internal Medicine

## 2015-07-31 VITALS — BP 110/60 | HR 63 | Temp 98.3°F | Resp 18 | Ht 64.0 in | Wt 141.0 lb

## 2015-07-31 DIAGNOSIS — E119 Type 2 diabetes mellitus without complications: Secondary | ICD-10-CM

## 2015-07-31 DIAGNOSIS — J441 Chronic obstructive pulmonary disease with (acute) exacerbation: Secondary | ICD-10-CM | POA: Diagnosis not present

## 2015-07-31 DIAGNOSIS — E039 Hypothyroidism, unspecified: Secondary | ICD-10-CM

## 2015-07-31 DIAGNOSIS — E871 Hypo-osmolality and hyponatremia: Secondary | ICD-10-CM | POA: Diagnosis not present

## 2015-07-31 DIAGNOSIS — I10 Essential (primary) hypertension: Secondary | ICD-10-CM | POA: Diagnosis not present

## 2015-07-31 DIAGNOSIS — R600 Localized edema: Secondary | ICD-10-CM

## 2015-07-31 DIAGNOSIS — R634 Abnormal weight loss: Secondary | ICD-10-CM

## 2015-07-31 DIAGNOSIS — R350 Frequency of micturition: Secondary | ICD-10-CM

## 2015-07-31 NOTE — Telephone Encounter (Signed)
Aaron Hendrix from [facility] has requested a verbal order for physical therapy, 2x a week for 5 week.  [phone]

## 2015-07-31 NOTE — Progress Notes (Signed)
[name], male   DOB: 1930-05-20, 81 y.o.   MRN: [id number]   Subjective:    [name], male    DOB: [birth year], 81 y.o.   MRN: [id number]  HPI  Patient with past history of diabetes, documented COPD, hypercholesterolemia and hypertension.  Recently admitted with hyponatremia.  See previous hospitalization notes for details.  He is accompanied by his son and his wife.  History obtained from all three of them.  He is doing better.  Is eating.  Some decreased appetite.  Drinking Ensure.  Just saw pulmonary yesterday.  Saw nephrology this am.  Had labs yesterday and this am.  His lasix was increased today.  He was also instructed to limit his free water intake to 32 ounces.  He has some lower extremity swelling, but has improved.  No chest pain or tightness.  Breathing stable.  States blood pressure has been running lower than previous checks.  Blood pressures recently recorded 102/64, 103/57, 134/74 and 110/60.  Is on flomax.  Has f/u with urology next week.  Overall feels better.  Physical therapy is working with him.     Past Medical History  Diagnosis Date  . COPD with asthma ([place])   . Diabetes mellitus     Diet control   . Hyperlipidemia   . Carpal tunnel syndrome   . DJD (degenerative joint disease), cervical   . Vitamin D deficiency   . Tremor, essential   . Internal hemorrhoids   . Personal history of colonic polyps     adenomatous  . IBS (irritable bowel syndrome)   . Peptic ulcer   . Duodenal ulcer, with partial obstruction 08/10/2012  . Numbness and tingling in right hand     started 2 yeas ago  . Hypertension     no medicine needed  . Memory deficit 12/16/2013  . Essential and other specified forms of tremor 12/16/2013  . Polyneuropathy in diabetes(357.2)    Past Surgical History  Procedure Laterality Date  . Anal fissure repair    . Colonoscopy w/ biopsies and polypectomy  8/03, 6/05, 7/09, 9/10    internal hemorrhoids, tubular  adenomas, mucosa & lymphoid nodules  . Upper gastrointestinal endoscopy  3/05, 7/09, 9/10,2013    gastritis, duodenitis  . Total hip arthroplasty Right 11/13/2012    Procedure: TOTAL HIP ARTHROPLASTY ANTERIOR APPROACH;  Surgeon: Mcarthur Rossetti, MD;  Location: [facility];  Service: Orthopedics;  Laterality: Right;  Right total hip arthroplasty  . Ankle surgery Right     right- pins placed in   Family History  Problem Relation Age of Onset  . Colon cancer Neg Hx   . Esophageal cancer Neg Hx   . Rectal cancer Neg Hx   . Stomach cancer Neg Hx   . Leukemia Maternal Aunt   . Thyroid cancer Daughter 92  . Diabetes Son    Social History   Social History  . Marital Status: Married    Spouse Name: N/A  . Number of Children: 3  . 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 Smoking status: Former Smoker    Types: Cigarettes    Quit date: 07/02/1963  . Smokeless tobacco: Never Used  . Alcohol Use: 0.0 oz/week    0 Standard drinks or equivalent per week     Comment: occasional  . Drug Use: No  .  Sexual Activity: Not Asked   Other Topics Concern  . None   Social History Narrative   Veteran [employer]    Outpatient Encounter Prescriptions as of 07/31/2015  Medication Sig  . acetaminophen (TYLENOL) 500 MG tablet Take 500 mg by mouth every 6 (six) hours as needed.  Marland Kitchen albuterol (PROVENTIL) (2.5 MG/3ML) 0.083% nebulizer solution Take 3 mLs (2.5 mg total) by nebulization every 6 (six) hours as needed for wheezing or shortness of breath.  Mariane Baumgarten Calcium (STOOL SOFTENER PO) Take by mouth.  . feeding supplement, ENSURE ENLIVE, (ENSURE ENLIVE) LIQD Take 237 mLs by mouth 2 (two) times daily between meals.  . Fluticasone Furoate-Vilanterol (BREO ELLIPTA) 100-25 MCG/INH AEPB Inhale 1 puff into the lungs daily.  . furosemide (LASIX) 20 MG tablet Take 1 tablet (20 mg total) by mouth daily. (Patient taking differently:  Take 40 mg by mouth daily. )  . levothyroxine (SYNTHROID, LEVOTHROID) 50 MCG tablet Take 50 mcg by mouth daily before breakfast.  . losartan (COZAAR) 50 MG tablet Take 1 tablet (50 mg total) by mouth daily.  . metoprolol tartrate (LOPRESSOR) 25 MG tablet Take 1 tablet (25 mg total) by mouth 2 (two) times daily.  . pantoprazole (PROTONIX) 40 MG tablet Take 40 mg by mouth daily.  Marland Kitchen saccharomyces boulardii (FLORASTOR) 250 MG capsule Take 250 mg by mouth as needed.   . sodium chloride 1 G tablet Take 1 tablet (1 g total) by mouth 2 (two) times daily with a meal.  . tamsulosin (FLOMAX) 0.4 MG CAPS capsule Take 1 capsule (0.4 mg total) by mouth daily.  Marland Kitchen tiotropium (SPIRIVA) 18 MCG inhalation capsule Place 1 capsule (18 mcg total) into inhaler and inhale daily.  . traMADol (ULTRAM) 50 MG tablet Take by mouth at bedtime as needed.  . zolpidem (AMBIEN) 5 MG tablet Take 5 mg by mouth at bedtime as needed for sleep.   No facility-administered encounter medications on file as of 07/31/2015.    Review of Systems  Constitutional: Negative for fever.       Decreased appetite.  Is eating some.  Drinking ensure.   HENT: Negative for congestion and sinus pressure.   Eyes: Negative for discharge and visual disturbance.  Respiratory: Negative for cough and chest tightness.   Cardiovascular: Negative for chest pain and palpitations.       Lower extremity swelling - has improved.    Gastrointestinal: Negative for nausea, vomiting, abdominal pain and diarrhea.  Genitourinary: Negative for dysuria and difficulty urinating.  Musculoskeletal: Negative for back pain and joint swelling.  Skin: Negative for color change and rash.  Neurological: Negative for dizziness, light-headedness and headaches.  Psychiatric/Behavioral: Negative for dysphoric mood and agitation.       Objective:     Blood pressure rechecked by me:  120/58  Physical Exam  Constitutional: He appears well-developed and well-nourished. No  distress.  HENT:  Nose: Nose normal.  Mouth/Throat: Oropharynx is clear and moist.  Eyes: Conjunctivae are normal. Right eye exhibits no discharge. Left eye exhibits no discharge.  Neck: Neck supple. No thyromegaly present.  Cardiovascular: Normal rate and regular rhythm.   Pulmonary/Chest: Effort normal and breath sounds normal. No respiratory distress.  Abdominal: Soft. Bowel sounds are normal. There is no tenderness.  Musculoskeletal: He exhibits edema. He exhibits no tenderness.  Pedal and ankle edema.  (improved from previous check).    Lymphadenopathy:    He has no cervical adenopathy.  Skin: No rash noted. No erythema.  Psychiatric: He has  a normal mood and affect. His behavior is normal.    BP 110/60 mmHg  Pulse 63  Temp(Src) 98.3 F (36.8 C) (Oral)  Resp 18  Ht '5\' 4"'$  (1.626 m)  Wt 141 lb (63.957 kg)  BMI 24.19 kg/m2  SpO2 97% Wt Readings from Last 3 Encounters:  07/31/15 141 lb (63.957 kg)  07/30/15 139 lb (63.05 kg)  07/29/15 136 lb (61.689 kg)     Lab Results  Component Value Date   WBC 5.7 07/22/2015   HGB 11.0* 07/22/2015   HCT 34.2* 07/22/2015   PLT 271 07/22/2015   GLUCOSE 124* 07/30/2015   CHOL 223* 06/24/2015   TRIG 53.0 06/24/2015   HDL 96.10 06/24/2015   LDLCALC 116* 06/24/2015   ALT 8* 07/22/2015   AST 15 07/22/2015   NA 129* 07/30/2015   K 4.0 07/30/2015   CL 91* 07/30/2015   CREATININE 0.91 07/30/2015   BUN 16 07/30/2015   CO2 33* 07/30/2015   TSH 1.733 07/13/2015   PSA 0.83 02/06/2015   INR 0.94 11/06/2012   HGBA1C 6.1* 06/26/2015   MICROALBUR 93.8* 06/24/2015       Assessment & Plan:   Problem List Items Addressed This Visit    COPD (chronic obstructive pulmonary disease) ([place])    Followed by pulmonary - Dr Alva Garnet.  Changed to [facility].  Breathing stable.  Follow.       Diabetes mellitus ([place])    Was given a glucometer today.  Pt and the family was instructed on how to check blood sugars.  Follow sugars.  Hold on changing  medications.        Hypertension - Primary    Blood pressure as outlined.  Same medication regimen.  Metabolic panel being followed by nephrology.        Hyponatremia    Has been a persistent problem with low sodium - two recent hospitalizations.  Being followed by nephrology now.  Just had labs this am.       Hypothyroidism    On thyroid replacement.  Follow tsh.       Increased urinary frequency    Seeing urology now.  On flomax.  Due f/u next week.        Loss of weight    Weight up a few pounds from previous check.  Lower extremity swelling has improved.  Encouraged increased po intake.  Drinking ensure.  Follow.       Lower extremity edema    Compression hose.  Nephrology just increased lasix to bid.  Follow.           [name], MD

## 2015-07-31 NOTE — Progress Notes (Signed)
Pre-visit discussion using our clinic review tool. No additional management support is needed unless otherwise documented below in the visit note.  

## 2015-08-02 ENCOUNTER — Encounter: Payer: Self-pay | Admitting: Internal Medicine

## 2015-08-02 ENCOUNTER — Encounter: Payer: Self-pay | Admitting: Oncology

## 2015-08-02 NOTE — Assessment & Plan Note (Signed)
Blood pressure as outlined.  Same medication regimen.  Metabolic panel being followed by nephrology.

## 2015-08-02 NOTE — Assessment & Plan Note (Signed)
Was given a glucometer today.  Pt and the family was instructed on how to check blood sugars.  Follow sugars.  Hold on changing medications.

## 2015-08-02 NOTE — Assessment & Plan Note (Signed)
Followed by pulmonary - Dr Alva Garnet.  Changed to [facility].  Breathing stable.  Follow.

## 2015-08-02 NOTE — Assessment & Plan Note (Signed)
Compression hose.  Nephrology just increased lasix to bid.  Follow.

## 2015-08-02 NOTE — Assessment & Plan Note (Signed)
On thyroid replacement.  Follow tsh.  

## 2015-08-02 NOTE — Assessment & Plan Note (Signed)
Weight up a few pounds from previous check.  Lower extremity swelling has improved.  Encouraged increased po intake.  Drinking ensure.  Follow.

## 2015-08-02 NOTE — Assessment & Plan Note (Signed)
Has been a persistent problem with low sodium - two recent hospitalizations.  Being followed by nephrology now.  Just had labs this am.

## 2015-08-02 NOTE — Assessment & Plan Note (Signed)
Seeing urology now.  On flomax.  Due f/u next week.

## 2015-08-03 ENCOUNTER — Other Ambulatory Visit: Payer: Self-pay

## 2015-08-03 ENCOUNTER — Ambulatory Visit (INDEPENDENT_AMBULATORY_CARE_PROVIDER_SITE_OTHER): Payer: Medicare Other | Admitting: Internal Medicine

## 2015-08-03 ENCOUNTER — Encounter: Payer: Self-pay | Admitting: Internal Medicine

## 2015-08-03 VITALS — BP 118/62 | HR 69 | Ht 64.0 in | Wt 141.8 lb

## 2015-08-03 DIAGNOSIS — G4733 Obstructive sleep apnea (adult) (pediatric): Secondary | ICD-10-CM

## 2015-08-03 DIAGNOSIS — J9612 Chronic respiratory failure with hypercapnia: Secondary | ICD-10-CM

## 2015-08-03 MED ORDER — METOPROLOL TARTRATE 25 MG PO TABS: 25.0000 mg | ORAL_TABLET | Freq: Two times a day (BID) | ORAL | Status: DC

## 2015-08-03 NOTE — Telephone Encounter (Signed)
ok 

## 2015-08-03 NOTE — Telephone Encounter (Signed)
REFILL ON METOPROLOL

## 2015-08-03 NOTE — Addendum Note (Signed)
Addended by: Maryanna Shape A on: 08/03/2015 12:11 PM   Modules accepted: Orders

## 2015-08-03 NOTE — Progress Notes (Signed)
* [facility]     Assessment and Plan:   Obstructive sleep apnea.  -Moderate obstructive sleep apnea with an apnea-hypopnea index of 29. -We'll refer the patient for a home CPAP titration study for 1 week. If that does not yield usable data, we will consider an in-Lab titration study. -May benefit from BiPAP, given his hypercapnia, however, he must be trialed on CPAP first, and if this is not tolerated, could consider a BiPAP titration.  Chronic hypercarbic respiratory failure. Suspected COPD with recent acute exacerbation. He has notably minimal smoking history to explain such severe manifestations. There is no emphysema on CT chest but exam is consistent with impaired airflow.  - Cont Breo 100/25. One inhalation daily - Continue Spiriva - Cont albuterol nebulizer PRN  Excessive sleep apnea.      Date: 08/03/2015  MRN# [id number] Aaron Hendrix 1929-10-31   [name] is a 81 y.o. old male seen in follow up for chief complaint of  Chief Complaint  Patient presents with  . Follow-up    sleep study results. pt. has no new complaints      HPI:  Patient is an 81 year old male with a history of COPD, chronic hypercapnic respiratory failure. He is seeing Dr. Alva Garnet here in the office and has been maintained on Breo and Spiriva.  He is currently following up for sleep study results. His sleep study from 07/27/2015 was reviewed; showed moderate OSA with an AHI of 29 (>30=severe).    No flowsheet data found.  Pulmonary Functions Testing Results:  No results found for: FEV1, FVC, FEV1FVC, TLC, DLCO   Medication:   Outpatient Encounter Prescriptions as of 08/03/2015  Medication Sig  . acetaminophen (TYLENOL) 500 MG tablet Take 500 mg by mouth every 6 (six) hours as needed.  Marland Kitchen albuterol (PROVENTIL) (2.5 MG/3ML) 0.083% nebulizer solution Take 3 mLs (2.5 mg total) by nebulization every 6 (six) hours as needed for  wheezing or shortness of breath.  Mariane Baumgarten Calcium (STOOL SOFTENER PO) Take by mouth.  . feeding supplement, ENSURE ENLIVE, (ENSURE ENLIVE) LIQD Take 237 mLs by mouth 2 (two) times daily between meals.  . Fluticasone Furoate-Vilanterol (BREO ELLIPTA) 100-25 MCG/INH AEPB Inhale 1 puff into the lungs daily.  . furosemide (LASIX) 20 MG tablet Take 1 tablet (20 mg total) by mouth daily. (Patient taking differently: Take 40 mg by mouth daily. )  . levothyroxine (SYNTHROID, LEVOTHROID) 50 MCG tablet Take 50 mcg by mouth daily before breakfast.  . losartan (COZAAR) 50 MG tablet Take 1 tablet (50 mg total) by mouth daily.  . metoprolol tartrate (LOPRESSOR) 25 MG tablet Take 1 tablet (25 mg total) by mouth 2 (two) times daily.  . pantoprazole (PROTONIX) 40 MG tablet Take 40 mg by mouth daily.  Marland Kitchen saccharomyces boulardii (FLORASTOR) 250 MG capsule Take 250 mg by mouth as needed.   . sodium chloride 1 G tablet Take 1 tablet (1 g total) by mouth 2 (two) times daily with a meal.  . tamsulosin (FLOMAX) 0.4 MG CAPS capsule Take 1 capsule (0.4 mg total) by mouth daily.  Marland Kitchen tiotropium (SPIRIVA) 18 MCG inhalation capsule Place 1 capsule (18 mcg total) into inhaler and inhale daily.  . traMADol (ULTRAM) 50 MG tablet Take by mouth at bedtime as needed.  . zolpidem (AMBIEN) 5 MG tablet Take 5 mg by mouth at bedtime as needed for sleep.   No facility-administered encounter medications on file as of 08/03/2015.     Allergies:  Fexofenadine;  Quinapril hcl; and Latex  Review of Systems: Gen:  Denies  fever, sweats. HEENT: Denies blurred vision. Cvc:  No dizziness, chest pain or heaviness Resp:   Denies cough or sputum porduction. Gi: Denies swallowing difficulty, Gu:  Denies bladder incontinence, burning urine Ext:   No Joint pain, stiffness. Skin: No skin rash, easy bruising. Endoc:  No polyuria, polydipsia. Psych: No depression, insomnia. Other:  All other systems were reviewed and found to be negative  other than what is mentioned in the HPI.   Physical Examination:   VS: BP 118/62 mmHg  Pulse 69  Ht '5\' 4"'$  (1.626 m)  Wt 141 lb 12.8 oz (64.32 kg)  BMI 24.33 kg/m2  SpO2 97%  General Appearance: No distress  Neuro:without focal findings,  speech normal,  HEENT: PERRLA, EOM intact. Pulmonary: normal breath sounds, No wheezing.   CardiovascularNormal S1,S2.  No m/r/g.   Abdomen: Benign, Soft, non-tender. Renal:  No costovertebral tenderness  GU:  Not performed at this time. Endoc: No evident thyromegaly, no signs of acromegaly. Skin:   warm, no rash. Extremities: normal, no cyanosis, clubbing.   LABORATORY PANEL:   CBC No results for input(s): WBC, HGB, HCT, PLT in the last 168 hours. ------------------------------------------------------------------------------------------------------------------  Chemistries   Recent Labs Lab 07/30/15 1030  NA 129*  K 4.0  CL 91*  CO2 33*  GLUCOSE 124*  BUN 16  CREATININE 0.91  CALCIUM 8.8*   ------------------------------------------------------------------------------------------------------------------  Cardiac Enzymes No results for input(s): TROPONINI in the last 168 hours. ------------------------------------------------------------  RADIOLOGY:   No results found for this or any previous visit. Results for orders placed during the hospital encounter of 07/16/15  DG Chest 2 View   Narrative CLINICAL DATA:  Hyponatremia.  Right upper lobe mass.  COPD.  EXAM: CHEST  2 VIEW  COMPARISON:  07/02/2015  FINDINGS: Left jugular central venous catheter is been removed since previous study. Nodular density again seen in the right upper lobe, better visualized on recent chest CT of 06/25/2015. No evidence of pulmonary consolidation or edema. No evidence of pneumothorax or pleural effusion.  IMPRESSION: No acute findings. Right upper lobe nodular density, better demonstrated on recent chest CT of  06/25/2015.   Electronically Signed   By: Earle Gell M.D.   On: 07/16/2015 16:02    ------------------------------------------------------------------------------------------------------------------  Thank  you for allowing [facility] to assist in the care of your patient. Our recommendations are noted above.  Please contact us if we can be of further service.   Marda Stalker, MD.  [facility] Office Number: [phone]  Patricia Pesa, M.D.  Vilinda Boehringer, M.D.  Merton Border, M.D

## 2015-08-03 NOTE — Telephone Encounter (Signed)
Left a VM for Joelene Millin at [facility].

## 2015-08-03 NOTE — Telephone Encounter (Signed)
Please advise and I will call back. Thanks

## 2015-08-03 NOTE — Addendum Note (Signed)
Addended by: Oscar La R on: 08/03/2015 12:30 PM   Modules accepted: Orders

## 2015-08-03 NOTE — Patient Instructions (Signed)
Home CPAP titration study: Low pressure of 8. High pressure of 16.  Sleep Apnea Sleep apnea is disorder that affects a person's sleep. A person with sleep apnea has abnormal pauses in their breathing when they sleep. It is hard for them to get a good sleep. This makes a person tired during the day. It also can lead to other physical problems. There are three types of sleep apnea. One type is when breathing stops for a short time because your airway is blocked (obstructive sleep apnea). Another type is when the brain sometimes fails to give the normal signal to breathe to the muscles that control your breathing (central sleep apnea). The third type is a combination of the other two types. HOME CARE   Take all medicine as told by your doctor.  Avoid alcohol, calming medicines (sedatives), and depressant drugs.  Try to lose weight if you are overweight. Talk to your doctor about a healthy weight goal.  Your doctor may have you use a device that helps to open your airway. It can help you get the air that you need. It is called a positive airway pressure (PAP) device.   MAKE SURE YOU:   Understand these instructions.  Will watch your condition.  Will get help right away if you are not doing well or get worse.  It may take approximately 1 month for you to get used to wearing her CPAP every night.  Be sure to work with

## 2015-08-04 ENCOUNTER — Encounter: Payer: Self-pay | Admitting: Oncology

## 2015-08-04 NOTE — Progress Notes (Signed)
[place] @ [facility] Telephone:(336) [phone] Fax:(336) [phone]   Follow-up  Aaron Hendrix OB: [birth year] MR#: [id number]#:[id number]  Patient Care Team: [name], MD as PCP - General (Internal Medicine)   CHIEF COMPLAINT:  Follow-up regarding hyponatremia lung mass and a PET scan Interval history  Patient was recently discharged from the hospital.  Had a PET scan done.  Repeat CT scan revealed right upper lobe mass is decreased in size significantly. Patient has a history of COPD and congestive heart failure or being closely followed by pulmonologist and cardiologist. Feeling somewhat stronger.  Still in wheelchair. VISIT DIAGNOSIS:    ICD-9-CM ICD-10-CM   1. Hyponatremia 276.1 E87.1 NM PET Image Initial (PI) Skull Base To Thigh     NM PET Image Initial (PI) Skull Base To Thigh  2. Somnolence 780.09 R40.0   3. Urinary frequency 788.41 R35.0   4. Hyperglycemia 790.29 R73.9   5. Lung mass 786.6 R91.8 DG Chest 2 View     DG Chest 2 View  6. Status post PICC central line placement V45.89 Z95.828 DG Chest [facility] 1 View     DG Chest [place] 1 View  7. Respiratory failure (HCC) 518.81 J96.90 CT Chest Wo Contrast     CT Chest Wo Contrast      No history exists.    Oncology Flowsheet                   - - - - - - -   - - - - - - -   - - - - - - -   - - - - - - -      REVIEW OF SYSTEMS:  Gen. status: Patient is lying in the bed very poor historian. Says he is feeling weak and tired. Has increasing shortness of breath increasing cough. Poor appetite. Is and was brought back with hyponatremia Lower extremity swelling Neurological system no headache no dizziness Skin: No rash GI: Poor appetite. No nausea no vomiting Lungs: Increasing shortness of breath All other systems have been reviewed  As per HPI. Otherwise, a  complete review of systems is negatve.  PAST MEDICAL HISTORY: Past Medical History  Diagnosis Date  . COPD with asthma ([place])   . Diabetes mellitus     Diet control   . Hyperlipidemia   . Carpal tunnel syndrome   . DJD (degenerative joint disease), cervical   . Vitamin D deficiency   . Tremor, essential   . Internal hemorrhoids   . Personal history of colonic polyps     adenomatous  . IBS (irritable bowel syndrome)   . Peptic ulcer   . Duodenal ulcer, with partial obstruction 08/10/2012  . Numbness and tingling in right hand     started 2 yeas ago  . Hypertension     no medicine needed  . Memory deficit 12/16/2013  . Essential and other specified forms of tremor 12/16/2013  . Polyneuropathy in diabetes(357.2)     PAST SURGICAL HISTORY: Past Surgical History  Procedure Laterality Date  . Anal fissure repair    . Colonoscopy w/ biopsies and polypectomy  8/03, 6/05, 7/09, 9/10    internal hemorrhoids, tubular adenomas, mucosa & lymphoid nodules  . Upper gastrointestinal endoscopy  3/05, 7/09, 9/10,2013    gastritis, duodenitis  .  Total hip arthroplasty Right 11/13/2012    Procedure: TOTAL HIP ARTHROPLASTY ANTERIOR APPROACH; Surgeon: Mcarthur Rossetti, MD; Location: [place]; Service: Orthopedics; Laterality: Right; Right total hip arthroplasty  . Ankle surgery Right     right- pins placed in    FAMILY HISTORY Family History  Problem Relation Age of Onset  . Colon cancer Neg Hx   . Esophageal cancer Neg Hx   . Rectal cancer Neg Hx   . Stomach cancer Neg Hx   . Leukemia Maternal Aunt   . Thyroid cancer Daughter 48  . Diabetes Son        ADVANCED DIRECTIVES:  Patient does not have any living will or healthcare power of attorney. Information was given . Available resources had been discussed. We will follow-up on  subsequent appointments regarding this issue  HEALTH MAINTENANCE: Social History  Substance Use Topics  . Smoking status: Former Smoker    Types: Cigarettes    Quit date: 07/02/1963  . Smokeless tobacco: Never Used  . Alcohol Use: 0.0 oz/week    0 Standard drinks or equivalent per week     Comment: occasional    :  Allergies  Allergen Reactions  . Fexofenadine Other (See Comments)  . Quinapril Hcl Other (See Comments)    Unknown   . Latex Rash    Current Facility-Administered Medications  Medication Dose Route Frequency Provider Last Rate Last Dose  . 0.9 % sodium chloride infusion 250 mL Intravenous PRN Srikar Sudini, MD    . acetaminophen (TYLENOL) tablet 650 mg 650 mg Oral Q6H PRN Hillary Bow, MD     Or  . acetaminophen (TYLENOL) suppository 650 mg 650 mg Rectal Q6H PRN Srikar Sudini, MD    . acidophilus (RISAQUAD) capsule 1 capsule 1 capsule Oral Daily Hillary Bow, MD  1 capsule at 07/21/15 0942  . albuterol (PROVENTIL) (2.5 MG/3ML) 0.083% nebulizer solution 2.5 mg 2.5 mg Nebulization Q2H PRN Hillary Bow, MD    . aspirin EC tablet 81 mg 81 mg Oral Daily Hillary Bow, MD  81 mg at 07/21/15 0943  . budesonide-formoterol (SYMBICORT) 160-4.5 MCG/ACT inhaler 2 puff 2 puff Inhalation BID Hillary Bow, MD  2 puff at 07/21/15 0757  . cloNIDine (CATAPRES) tablet 0.1 mg 0.1 mg Oral BID Vishal Mungal, MD  0.1 mg at 07/21/15 1106  . enoxaparin (LOVENOX) injection 40 mg 40 mg Subcutaneous Q24H Hillary Bow, MD  40 mg at 07/20/15 2104  . feeding supplement (ENSURE ENLIVE) (ENSURE ENLIVE) liquid 237 mL 237 mL Oral BID BM Srikar Sudini, MD  237 mL at 07/21/15 1055  . furosemide (LASIX) tablet 20 mg 20 mg Oral Daily Hillary Bow, MD  20 mg at 07/21/15 0942  . hydrALAZINE (APRESOLINE) injection 10 mg 10 mg  Intravenous Q6H PRN Srikar Sudini, MD    . HYDROcodone-acetaminophen (NORCO/VICODIN) 5-325 MG per tablet 1-2 tablet 1-2 tablet Oral Q4H PRN Srikar Sudini, MD    . levothyroxine (SYNTHROID, LEVOTHROID) tablet 50 mcg 50 mcg Oral QAC breakfast Hillary Bow, MD  50 mcg at 07/21/15 0630  . losartan (COZAAR) tablet 50 mg 50 mg Oral Daily Hillary Bow, MD  50 mg at 07/21/15 0942  . metoprolol tartrate (LOPRESSOR) tablet 25 mg 25 mg Oral BID Hillary Bow, MD  25 mg at 07/21/15 0943  . ondansetron (ZOFRAN) tablet 4 mg 4 mg Oral Q6H PRN Srikar Sudini, MD     Or  . ondansetron (ZOFRAN) injection 4 mg 4 mg Intravenous Q6H PRN Hillary Bow, MD    .  pantoprazole (PROTONIX) EC tablet 40 mg 40 mg Oral QAC breakfast Srikar Sudini, MD  40 mg at 07/21/15 0757  . polyethylene glycol (MIRALAX / GLYCOLAX) packet 17 g 17 g Oral Daily PRN Srikar Sudini, MD  17 g at 07/19/15 0837  . sodium chloride 0.9 % injection 3 mL 3 mL Intravenous Q12H Srikar Sudini, MD  3 mL at 07/20/15 1404  . sodium chloride 0.9 % injection 3 mL 3 mL Intravenous PRN Srikar Sudini, MD    . sodium chloride tablet 1 g 1 g Oral BID WC Munsoor Lateef, MD    . tamsulosin (FLOMAX) capsule 0.4 mg 0.4 mg Oral Daily Harmeet Singh, MD  0.4 mg at 07/21/15 0942  . tiotropium (SPIRIVA) inhalation capsule 18 mcg 18 mcg Inhalation Daily Srikar Sudini, MD  18 mcg at 07/21/15 0943  . traMADol (ULTRAM) tablet 50 mg 50 mg Oral Q6H PRN Srikar Sudini, MD      OBJECTIVE: PHYSICAL EXAM: Patient is lying in the bed. Not any acute distress Lungs: Emphysematous chest crepitation and right base Cardiac: Tachycardia Abdomen: Soft liver and spleen not palpable Lower extremity no edema Skin: No rash Neurological system difficult to examine in    LAB RESULTS:  Admission on 07/16/2015  Component Date  Value Ref Range Status  . Sodium 07/16/2015 118* 135 - 145 mmol/L Final   Comment: CRITICAL RESULT CALLED TO, READ BACK BY AND VERIFIED WITH MARY NEEDHAM AT 1406 07/16/15 WDM   . Potassium 07/16/2015 4.1  3.5 - 5.1 mmol/L Final   HEMOLYSIS AT THIS LEVEL MAY AFFECT RESULT  . Chloride 07/16/2015 77* 101 - 111 mmol/L Final  . CO2 07/16/2015 28  22 - 32 mmol/L Final  . Glucose, Bld 07/16/2015 159* 65 - 99 mg/dL Final  . BUN 07/16/2015 8  6 - 20 mg/dL Final  . Creatinine, Ser 07/16/2015 0.78  0.61 - 1.24 mg/dL Final  . Calcium 07/16/2015 8.5* 8.9 - 10.3 mg/dL Final  . GFR calc non Af Amer 07/16/2015 >60  >60 mL/min Final  . GFR calc Af Amer 07/16/2015 >60  >60 mL/min Final   Comment: (NOTE) The eGFR has been calculated using the CKD EPI equation. This calculation has not been validated in all clinical situations. eGFR's persistently <60 mL/min signify possible Chronic Kidney Disease.   . Anion gap 07/16/2015 13  5 - 15 Final  . WBC 07/16/2015 6.3  3.8 - 10.6 K/uL Final  . RBC 07/16/2015 4.43  4.40 - 5.90 MIL/uL Final  . Hemoglobin 07/16/2015 13.7  13.0 - 18.0 g/dL Final  . HCT 07/16/2015 41.2  40.0 - 52.0 % Final  . MCV 07/16/2015 93.1  80.0 - 100.0 fL Final  . MCH 07/16/2015 30.9  26.0 - 34.0 pg Final  . MCHC 07/16/2015 33.2  32.0 - 36.0 g/dL Final  . RDW 07/16/2015 12.8  11.5 - 14.5 % Final  . Platelets 07/16/2015 304  150 - 440 K/uL Final  . Color, Urine 07/16/2015 STRAW* YELLOW Final  . APPearance 07/16/2015 CLEAR* CLEAR Final  . Glucose, UA 07/16/2015 NEGATIVE  NEGATIVE mg/dL Final  . Bilirubin Urine 07/16/2015 NEGATIVE  NEGATIVE Final  . Ketones, ur 07/16/2015 NEGATIVE  NEGATIVE mg/dL Final  . Specific Gravity, Urine 07/16/2015 1.008  1.005 - 1.030 Final  . Hgb urine dipstick 07/16/2015 NEGATIVE  NEGATIVE  Final  . pH 07/16/2015 7.0  5.0 - 8.0 Final  . Protein, ur 07/16/2015 100* NEGATIVE mg/dL Final  . Nitrite 07/16/2015 NEGATIVE  NEGATIVE Final  .   Leukocytes, UA 07/16/2015 NEGATIVE  NEGATIVE Final  . RBC / HPF 07/16/2015 0-5  0 - 5 RBC/hpf Final  . WBC, UA 07/16/2015 0-5  0 - 5 WBC/hpf Final  . Bacteria, UA 07/16/2015 NONE SEEN  NONE SEEN Final  . Squamous Epithelial / LPF 07/16/2015 0-5* NONE SEEN Final  . Mucous 07/16/2015 PRESENT   Final  . Creatinine, Urine 07/16/2015 43   Final  . Osmolality, Ur 07/16/2015 294* 300 - 900 mOsm/kg Final  . Sodium, Ur 07/16/2015 90   Final  . Sodium 07/17/2015 126* 135 - 145 mmol/L Final  . Potassium 07/17/2015 3.6  3.5 - 5.1 mmol/L Final  . Chloride 07/17/2015 84* 101 - 111 mmol/L Final  . CO2 07/17/2015 35* 22 - 32 mmol/L Final  . Glucose, Bld 07/17/2015 112* 65 - 99 mg/dL Final                                                                                                                                                                                                        .       .       .       .       .       .       .       .       .       .       .       .       .       .        Comment: CRITICAL RESULT CALLED TO, READ BACK BY AND VERIFIED WITH: CRITICAL RESULTS DR. SUDINI,07/19/15 1231   . pO2, Arterial 07/19/2015 58* 83.0 - 108.0 mmHg Final  . Bicarbonate 07/19/2015 37.0* 21.0 - 28.0 mEq/L Final  . Acid-Base Excess 07/19/2015 8.1* 0.0 - 3.0 mmol/L Final  . O2 Saturation 07/19/2015 86.3   Final  . Patient temperature 07/19/2015 37.0   Final  . Collection site 07/19/2015 RIGHT RADIAL   Final  . Drawn by 07/19/2015 RIGHT RADIAL   Final  . Sample type 07/19/2015 ARTERIAL DRAW   Final  . Allens test (pass/fail) 07/19/2015 POSITIVE*  PASS Final  . MRSA by PCR 07/19/2015 NEGATIVE  NEGATIVE Final   Comment:  The GeneXpert MRSA Assay (FDA approved for NASAL specimens only), is one component of a comprehensive MRSA colonization surveillance program. It is not intended to diagnose MRSA infection nor to guide or monitor treatment for MRSA infections.  IMPRESSION: 1. No specific findings identified to suggest hypermetabolic/FDG avid tumor. 2. Continued resolution of right upper lobe pulmonary nodule which currently measures 1.3 cm and exhibits non malignant range FDG uptake. 3. Bilateral pleural effusions with bibasilar atelectasis/consolidation. 4. Aortic atherosclerosis as well as multi vessel coronary artery calcification. Assessment and plan Hyponatremia most likely secondary to COPD and congestive heart failure PET scan has been reviewed independently right upper lobe mass is not hypermetabolic this has decreased significantly still since last evaluation At this point in time I had prolonged discussion with the patient that any further procedure is going to be very difficult.  As there is significant decrease in the size of the mass it can be continued to follow by pulmonologist.  There is no clear-cut evidence of any malignancy at present time  Patient has been discharged from my care to be followed by pulmonologist and primary care physician    No matching staging information was found for the patient.  Forest Gleason, MD 07/21/2015 4:27 PM

## 2015-08-06 DIAGNOSIS — G4730 Sleep apnea, unspecified: Secondary | ICD-10-CM | POA: Diagnosis not present

## 2015-08-10 ENCOUNTER — Ambulatory Visit (INDEPENDENT_AMBULATORY_CARE_PROVIDER_SITE_OTHER): Payer: Medicare Other | Admitting: Urology

## 2015-08-10 ENCOUNTER — Encounter: Payer: Self-pay | Admitting: Urology

## 2015-08-10 VITALS — BP 134/76 | HR 65 | Ht 64.0 in | Wt 140.5 lb

## 2015-08-10 DIAGNOSIS — N401 Enlarged prostate with lower urinary tract symptoms: Secondary | ICD-10-CM | POA: Diagnosis not present

## 2015-08-10 DIAGNOSIS — R351 Nocturia: Secondary | ICD-10-CM

## 2015-08-10 DIAGNOSIS — N138 Other obstructive and reflux uropathy: Secondary | ICD-10-CM

## 2015-08-10 LAB — URINALYSIS, COMPLETE
BILIRUBIN UA: NEGATIVE
Glucose, UA: NEGATIVE
Ketones, UA: NEGATIVE
Leukocytes, UA: NEGATIVE
NITRITE UA: NEGATIVE
PH UA: 7 (ref 5.0–7.5)
PROTEIN UA: NEGATIVE
Specific Gravity, UA: 1.01 (ref 1.005–1.030)
UUROB: 0.2 mg/dL (ref 0.2–1.0)

## 2015-08-10 LAB — BLADDER SCAN AMB NON-IMAGING: SCAN RESULT: 248

## 2015-08-10 LAB — MICROSCOPIC EXAMINATION
Bacteria, UA: NONE SEEN
EPITHELIAL CELLS (NON RENAL): NONE SEEN /HPF (ref 0–10)
RBC MICROSCOPIC, UA: NONE SEEN /HPF (ref 0–?)
WBC UA: NONE SEEN /HPF (ref 0–?)

## 2015-08-10 MED ORDER — FINASTERIDE 5 MG PO TABS: 5.0000 mg | ORAL_TABLET | Freq: Every day | ORAL | Status: DC

## 2015-08-10 NOTE — Progress Notes (Signed)
08/10/2015 10:40 AM   Aaron Hendrix 14-Jan-1930 [id number]  Referring provider: [name], MD [address] [place], [place] [id number]  Chief Complaint  Patient presents with  . Urinary Frequency    HPI: Patient is an 81 year old white male who was seen during a recent admission for hyponatremia with nocturia and was started on tamsulosin for his moderate PVR's of 200 cc.  He presents today for exam and a recheck on his PVR.    He is experiencing urinary frequency, urgency, nocturia, leakage of urine, intermittency, hesitancy, straining to urinate and a weak stream.  He has been diagnosed with sleep apnea and is getting his CPAP machine tomorrow.  His family is hopeful that this will help with his nocturia and hyponatremia.    He is taking the tamsulosin and has not seen any difference in his urinary symptoms.  His PVR today is 248 cc.  His UA today is unremarkable.    A median lobe of the prostate was appreciated on a CT scan in 05/2014.  PMH: Past Medical History  Diagnosis Date  . COPD with asthma ([place])   . Diabetes mellitus     Diet control   . Hyperlipidemia   . Carpal tunnel syndrome   . DJD (degenerative joint disease), cervical   . Vitamin D deficiency   . Tremor, essential   . Internal hemorrhoids   . Personal history of colonic polyps     adenomatous  . IBS (irritable bowel syndrome)   . Peptic ulcer   . Duodenal ulcer, with partial obstruction 08/10/2012  . Numbness and tingling in right hand     started 2 yeas ago  . Hypertension     no medicine needed  . Memory deficit 12/16/2013  . Essential and other specified forms of tremor 12/16/2013  . Polyneuropathy in diabetes(357.2)     Surgical History: Past Surgical History  Procedure Laterality Date  . Anal fissure repair    . Colonoscopy w/ biopsies and polypectomy  8/03, 6/05, 7/09, 9/10    internal hemorrhoids, tubular adenomas, mucosa & lymphoid nodules  . Upper  gastrointestinal endoscopy  3/05, 7/09, 9/10,2013    gastritis, duodenitis  . Total hip arthroplasty Right 11/13/2012    Procedure: TOTAL HIP ARTHROPLASTY ANTERIOR APPROACH;  Surgeon: Mcarthur Rossetti, MD;  Location: [place];  Service: Orthopedics;  Laterality: Right;  Right total hip arthroplasty  . Ankle surgery Right     right- pins placed in    Home Medications:    Medication List       This list is accurate as of: 08/10/15 11:59 PM.  Always use your most recent med list.               acetaminophen 500 MG tablet  Commonly known as:  TYLENOL  Take 500 mg by mouth every 6 (six) hours as needed.     albuterol (2.5 MG/3ML) 0.083% nebulizer solution  Commonly known as:  PROVENTIL  Take 3 mLs (2.5 mg total) by nebulization every 6 (six) hours as needed for wheezing or shortness of breath.     BREO ELLIPTA 100-25 MCG/INH Aepb  Generic drug:  Fluticasone Furoate-Vilanterol  Inhale 1 puff into the lungs daily.     feeding supplement (ENSURE ENLIVE) Liqd  Take 237 mLs by mouth 2 (two) times daily between meals.     finasteride 5 MG tablet  Commonly known as:  PROSCAR  Take 1 tablet (5 mg total) by  mouth daily.     furosemide 20 MG tablet  Commonly known as:  LASIX  Take 1 tablet (20 mg total) by mouth daily.     levothyroxine 50 MCG tablet  Commonly known as:  SYNTHROID, LEVOTHROID  Take 50 mcg by mouth daily before breakfast.     losartan 50 MG tablet  Commonly known as:  COZAAR  Take 1 tablet (50 mg total) by mouth daily.     metoprolol tartrate 25 MG tablet  Commonly known as:  LOPRESSOR  Take 1 tablet (25 mg total) by mouth 2 (two) times daily.     pantoprazole 40 MG tablet  Commonly known as:  PROTONIX  Take 40 mg by mouth daily.     saccharomyces boulardii 250 MG capsule  Commonly known as:  FLORASTOR  Take 250 mg by mouth as needed.     sodium chloride 1 G tablet  Take 1 tablet (1 g total) by mouth 2 (two) times daily with a meal.     STOOL  SOFTENER PO  Take by mouth.     tamsulosin 0.4 MG Caps capsule  Commonly known as:  FLOMAX  Take 1 capsule (0.4 mg total) by mouth daily.     tiotropium 18 MCG inhalation capsule  Commonly known as:  SPIRIVA  Place 1 capsule (18 mcg total) into inhaler and inhale daily.     traMADol 50 MG tablet  Commonly known as:  ULTRAM  Take by mouth at bedtime as needed.     zolpidem 5 MG tablet  Commonly known as:  AMBIEN  Take 5 mg by mouth at bedtime as needed for sleep.        Allergies:  Allergies  Allergen Reactions  . Fexofenadine Other (See Comments)  . Quinapril Hcl Other (See Comments)    Unknown   . Latex Rash    Family History: Family History  Problem Relation Age of Onset  . Colon cancer Neg Hx   . Esophageal cancer Neg Hx   . Rectal cancer Neg Hx   . Stomach cancer Neg Hx   . Prostate cancer Neg Hx   . Kidney disease Neg Hx   . Kidney cancer Neg Hx   . Leukemia Maternal Aunt   . Thyroid cancer Daughter 47  . Diabetes Son   . Urolithiasis Son     Social History:  reports that he quit smoking about 52 years ago. His smoking use included Cigarettes. He has never used smokeless tobacco. He reports that he drinks alcohol. He reports that he does not use illicit drugs.  ROS: UROLOGY Frequent Urination?: Yes Hard to postpone urination?: Yes Burning/pain with urination?: No Get up at night to urinate?: Yes Leakage of urine?: Yes Urine stream starts and stops?: Yes Trouble starting stream?: Yes Do you have to strain to urinate?: Yes Blood in urine?: No Urinary tract infection?: No Sexually transmitted disease?: No Injury to kidneys or bladder?: No Painful intercourse?: No Weak stream?: Yes Erection problems?: No Penile pain?: No  Gastrointestinal Nausea?: No Vomiting?: No Indigestion/heartburn?: No Diarrhea?: No Constipation?: No  Constitutional Fever: No Night sweats?: No Weight loss?: No Fatigue?: No  Skin Skin rash/lesions?: No Itching?:  No  Eyes Blurred vision?: No Double vision?: No  Ears/Nose/Throat Sore throat?: No Sinus problems?: No  Hematologic/Lymphatic Swollen glands?: No Easy bruising?: No  Cardiovascular Leg swelling?: No Chest pain?: No  Respiratory Cough?: No Shortness of breath?: No  Endocrine Excessive thirst?: No  Musculoskeletal Back pain?: No Joint  pain?: No  Neurological Headaches?: No Dizziness?: No  Psychologic Depression?: No Anxiety?: No  Physical Exam: BP 134/76 mmHg  Pulse 65  Ht '5\' 4"'$  (1.626 m)  Wt 140 lb 8 oz (63.73 kg)  BMI 24.10 kg/m2  Constitutional: Well nourished. Alert and oriented, No acute distress. HEENT: [place] AT, moist mucus membranes. Trachea midline, no masses. Cardiovascular: No clubbing, cyanosis, or edema. Respiratory: Normal respiratory effort, no increased work of breathing. GI: Abdomen is soft, non tender, non distended, no abdominal masses. Liver and spleen not palpable.  No hernias appreciated.  Stool sample for occult testing is not indicated.   GU: No CVA tenderness.  No bladder fullness or masses.  Patient with circumcised phallus.  Urethral meatus is patent.  No penile discharge. No penile lesions or rashes. Scrotum without lesions, cysts, rashes and/or edema.  Testicles are located scrotally bilaterally. No masses are appreciated in the testicles. Left and right epididymis are normal. Rectal: Patient with  normal sphincter tone. Anus and perineum without scarring or rashes. No rectal masses are appreciated. Prostate is approximately 50 grams, no nodules are appreciated. Seminal vesicles are normal. Skin: No rashes, bruises or suspicious lesions. Lymph: No cervical or inguinal adenopathy. Neurologic: Grossly intact, no focal deficits, moving all 4 extremities. Psychiatric: Normal mood and affect.   Laboratory Data: Lab Results  Component Value Date   WBC 5.7 07/22/2015   HGB 11.0* 07/22/2015   HCT 34.2* 07/22/2015   MCV 94.2 07/22/2015    PLT 271 07/22/2015    Lab Results  Component Value Date   CREATININE 0.91 07/30/2015    Lab Results  Component Value Date   PSA 0.83 02/06/2015    Lab Results  Component Value Date   HGBA1C 6.1* 06/26/2015    Urinalysis Results for orders placed or performed in visit on 08/10/15  Microscopic Examination  Result Value Ref Range   WBC, UA None seen 0 -  5 /hpf   RBC, UA None seen 0 -  2 /hpf   Epithelial Cells (non renal) None seen 0 - 10 /hpf   Mucus, UA Present (A) Not Estab.   Bacteria, UA None seen None seen/Few  Urinalysis, Complete  Result Value Ref Range   Specific Gravity, UA 1.010 1.005 - 1.030   pH, UA 7.0 5.0 - 7.5   Color, UA Yellow Yellow   Appearance Ur Clear Clear   Leukocytes, UA Negative Negative   Protein, UA Negative Negative/Trace   Glucose, UA Negative Negative   Ketones, UA Negative Negative   RBC, UA Trace (A) Negative   Bilirubin, UA Negative Negative   Urobilinogen, Ur 0.2 0.2 - 1.0 mg/dL   Nitrite, UA Negative Negative   Microscopic Examination See below:   BLADDER SCAN AMB NON-IMAGING  Result Value Ref Range   Scan Result 248     Assessment & Plan:    1. Nocturia:   Patient is hopeful that the CPAP machine will help with the nocturia.  I explained to the patient that nocturia is often multi-factorial and difficult to treat.  Sleeping disorders, heart conditions and peripheral vascular disease, diabetes,  enlarged prostate or urethral stricture causing bladder outlet obstruction and/or certain medications.  Patient is already on fluid restrictions at this time, but he sneaks liquids during the day.    - Urinalysis, Complete - BLADDER SCAN AMB NON-IMAGING  2. BPH with LUTS:  Patient is still having significant voiding symptoms and a moderate PVR.  I have added finasteride 5 mg daily,  but I did explain that with a median lobe, we may not see improvement of his urinary issues.  They will return in three months for PVR and symptom  recheck.  Return in about 3 months (around 11/10/2015) for PVR.  Zara Council, [facility] [address] [place], [place] [address] [phone]

## 2015-08-11 DIAGNOSIS — N401 Enlarged prostate with lower urinary tract symptoms: Secondary | ICD-10-CM

## 2015-08-11 DIAGNOSIS — R351 Nocturia: Secondary | ICD-10-CM | POA: Insufficient documentation

## 2015-08-11 DIAGNOSIS — N138 Other obstructive and reflux uropathy: Secondary | ICD-10-CM | POA: Insufficient documentation

## 2015-08-18 ENCOUNTER — Telehealth: Payer: Self-pay | Admitting: *Deleted

## 2015-08-18 NOTE — Telephone Encounter (Signed)
Notify pts wife that I can refill the pts losartan, but regarding the salt tablets, this will need to be refilled by nephrology.  They are following and controlling this medication.  Let me know if any problems.

## 2015-08-18 NOTE — Telephone Encounter (Signed)
LMTCB

## 2015-08-18 NOTE — Telephone Encounter (Signed)
Do you want to continue same medications?

## 2015-08-18 NOTE — Telephone Encounter (Signed)
Patient wife requested a medication refill for sodium kloride  '1mg'$  and losartan potassium '50mg'$  . Patients wife stated that he received the medications when he was in the hospital

## 2015-08-19 ENCOUNTER — Other Ambulatory Visit: Payer: Self-pay | Admitting: *Deleted

## 2015-08-19 MED ORDER — LOSARTAN POTASSIUM 50 MG PO TABS: 50.0000 mg | ORAL_TABLET | Freq: Every day | ORAL | Status: DC

## 2015-08-24 ENCOUNTER — Telehealth: Payer: Self-pay | Admitting: Internal Medicine

## 2015-08-24 NOTE — Telephone Encounter (Signed)
Order given to [employer]

## 2015-08-24 NOTE — Telephone Encounter (Signed)
Are you ok with given verbal order

## 2015-08-24 NOTE — Telephone Encounter (Signed)
Ok

## 2015-08-24 NOTE — Telephone Encounter (Signed)
Aaron Hendrix [phone] called from [name] regarding needing verbal order for to extend skilled nursing for 2 more weeks twice a week. Thank You!

## 2015-08-28 ENCOUNTER — Other Ambulatory Visit: Payer: Self-pay | Admitting: Internal Medicine

## 2015-09-03 ENCOUNTER — Encounter: Payer: Self-pay | Admitting: Podiatry

## 2015-09-03 ENCOUNTER — Ambulatory Visit (INDEPENDENT_AMBULATORY_CARE_PROVIDER_SITE_OTHER): Payer: Medicare Other | Admitting: Podiatry

## 2015-09-03 DIAGNOSIS — M79676 Pain in unspecified toe(s): Secondary | ICD-10-CM

## 2015-09-03 DIAGNOSIS — B351 Tinea unguium: Secondary | ICD-10-CM

## 2015-09-03 DIAGNOSIS — M79673 Pain in unspecified foot: Secondary | ICD-10-CM

## 2015-09-03 NOTE — Progress Notes (Signed)
Subjective: 81 y.o. returns the office today for painful, elongated, thickened toenails which he is unable to trim himself. Denies any redness or drainage around the nails. Since last appointment he was hospitalized for COPD and low sodium. He also has swelling to his legs and he saw his nephrologist yesterday, and fluid pills were increased. Denies any acute changes since last appointment and no new complaints today. Denies any systemic complaints such as fevers, chills, nausea, vomiting.   Objective: AAO 3, NAD DP/PT pulses palpable 1/4, CRT less than 3 seconds Moderate edema to bilateral ankles without any associated redness or warmth.  Nails hypertrophic, dystrophic, elongated, brittle, discolored 10. There is tenderness overlying the nails 1-5 bilaterally. There is no surrounding erythema or drainage along the nail sites. No open lesions or pre-ulcerative lesions are identified. No other areas of tenderness bilateral lower extremities. No overlying edema, erythema, increased warmth. No pain with calf compression, swelling, warmth, erythema.  Assessment: Patient presents with symptomatic onychomycosis  Plan: -Treatment options including alternatives, risks, complications were discussed -Nails sharply debrided 10 without complication/bleeding. -Follow-up with PCP as well for swelling.  -Discussed daily foot inspection. If there are any changes, to call the office immediately.  -Follow-up in 3 months or sooner if any problems are to arise. In the meantime, encouraged to call the office with any questions, concerns, changes symptoms.  Celesta Gentile, DPM

## 2015-09-07 ENCOUNTER — Telehealth: Payer: Self-pay | Admitting: Internal Medicine

## 2015-09-07 NOTE — Telephone Encounter (Signed)
Please advise on continuation of Home health.

## 2015-09-07 NOTE — Telephone Encounter (Signed)
Called and left a message for [place] with [facility] for physical therapy continuation.

## 2015-09-07 NOTE — Telephone Encounter (Signed)
Aaron Hendrix [phone] called from [facility] extend pt physical therapy for two times a week for six weeks? Thank you!

## 2015-09-07 NOTE — Telephone Encounter (Signed)
ok 

## 2015-09-08 ENCOUNTER — Telehealth: Payer: Self-pay | Admitting: Internal Medicine

## 2015-09-08 NOTE — Telephone Encounter (Signed)
This was approved yesterday, called and gave verbal to [place] on the phone .

## 2015-09-08 NOTE — Telephone Encounter (Signed)
Aaron Hendrix [phone] called from [name] regarding a verbal order to extend his OT for four weeks two times a week. Thank You!

## 2015-09-08 NOTE — Telephone Encounter (Signed)
Yes. I mentioned that to her she said it was for PT and this is for OT. :)

## 2015-10-07 ENCOUNTER — Telehealth: Payer: Self-pay | Admitting: Internal Medicine

## 2015-10-07 NOTE — Telephone Encounter (Signed)
Please advise for Verbal order and I will return a call to them. Thanks

## 2015-10-07 NOTE — Telephone Encounter (Signed)
Spoke with Marlowe Kays, gave verbal order for PT/OT as requested.

## 2015-10-07 NOTE — Telephone Encounter (Signed)
ok 

## 2015-10-07 NOTE — Telephone Encounter (Signed)
Aaron Hendrix [phone] from [name] called regarding to request a verbal order to extend Pt OT for 2 times a week for 2 weeks. Thank You!

## 2015-10-12 ENCOUNTER — Encounter: Payer: Self-pay | Admitting: Internal Medicine

## 2015-10-12 ENCOUNTER — Ambulatory Visit (INDEPENDENT_AMBULATORY_CARE_PROVIDER_SITE_OTHER): Payer: Medicare Other | Admitting: Internal Medicine

## 2015-10-12 VITALS — BP 118/58 | HR 100 | Temp 98.1°F | Resp 17 | Ht 64.0 in | Wt 137.5 lb

## 2015-10-12 DIAGNOSIS — E119 Type 2 diabetes mellitus without complications: Secondary | ICD-10-CM

## 2015-10-12 DIAGNOSIS — J441 Chronic obstructive pulmonary disease with (acute) exacerbation: Secondary | ICD-10-CM

## 2015-10-12 DIAGNOSIS — I10 Essential (primary) hypertension: Secondary | ICD-10-CM

## 2015-10-12 DIAGNOSIS — K588 Other irritable bowel syndrome: Secondary | ICD-10-CM

## 2015-10-12 DIAGNOSIS — E871 Hypo-osmolality and hyponatremia: Secondary | ICD-10-CM

## 2015-10-12 DIAGNOSIS — E039 Hypothyroidism, unspecified: Secondary | ICD-10-CM | POA: Diagnosis not present

## 2015-10-12 NOTE — Progress Notes (Signed)
Pre-visit discussion using our clinic review tool. No additional management support is needed unless otherwise documented below in the visit note.  

## 2015-10-12 NOTE — Progress Notes (Signed)
[name], male   DOB: November 10, 1929, 82 y.o.   MRN: [id number]   Subjective:    [name], male    DOB: [birth year], 82 y.o.   MRN: [id number]  HPI  Patient with past history of diabetes, hypercholesterolemia, hypertension and hyponatremia.  He comes in today to follow up on these issues.  He is accompanied by his wife.  History obtained from both of them.  He is feeling better.  Energy is better.  Lower extremity swelling is better.  PT/OT - completing next week.  Blood pressure has been averaging 110-130s/50-60s,  No chest pain or tightness.  No sob.  No acid reflux.  No abdominal pain or cramping.  Bowels stable.     Past Medical History  Diagnosis Date  . COPD with asthma ([place])   . Diabetes mellitus     Diet control   . Hyperlipidemia   . Carpal tunnel syndrome   . DJD (degenerative joint disease), cervical   . Vitamin D deficiency   . Tremor, essential   . Internal hemorrhoids   . Personal history of colonic polyps     adenomatous  . IBS (irritable bowel syndrome)   . Peptic ulcer   . Duodenal ulcer, with partial obstruction 08/10/2012  . Numbness and tingling in right hand     started 2 yeas ago  . Hypertension     no medicine needed  . Memory deficit 12/16/2013  . Essential and other specified forms of tremor 12/16/2013  . Polyneuropathy in diabetes(357.2)    Past Surgical History  Procedure Laterality Date  . Anal fissure repair    . Colonoscopy w/ biopsies and polypectomy  8/03, 6/05, 7/09, 9/10    internal hemorrhoids, tubular adenomas, mucosa & lymphoid nodules  . Upper gastrointestinal endoscopy  3/05, 7/09, 9/10,2013    gastritis, duodenitis  . Total hip arthroplasty Right 11/13/2012    Procedure: TOTAL HIP ARTHROPLASTY ANTERIOR APPROACH;  Surgeon: Mcarthur Rossetti, MD;  Location: [facility];  Service: Orthopedics;  Laterality: Right;  Right total hip arthroplasty  . Ankle surgery Right     right- pins placed in   Family  History  Problem Relation Age of Onset  . Colon cancer Neg Hx   . Esophageal cancer Neg Hx   . Rectal cancer Neg Hx   . Stomach cancer Neg Hx   . Prostate cancer Neg Hx   . Kidney disease Neg Hx   . Kidney cancer Neg Hx   . Leukemia Maternal Aunt   . Thyroid cancer Daughter 62  . Diabetes Son   . Urolithiasis Son    Social History   Social History  . Marital Status: Married    Spouse Name: N/A  . Number of Children: 3  . 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 Smoking status: Former Smoker    Types: Cigarettes    Quit date: 07/02/1963  . Smokeless tobacco: Never Used  . Alcohol Use: 0.0 oz/week    0 Standard drinks or equivalent per week     Comment: occasional  . Drug Use: No  . Sexual Activity: Not Asked   Other Topics Concern  . None   Social History Narrative   Veteran [employer]    Outpatient Encounter Prescriptions as of 10/12/2015  Medication Sig  . acetaminophen (TYLENOL) 500 MG tablet Take 500 mg  by mouth every 6 (six) hours as needed.  Marland Kitchen albuterol (PROVENTIL) (2.5 MG/3ML) 0.083% nebulizer solution Take 3 mLs (2.5 mg total) by nebulization every 6 (six) hours as needed for wheezing or shortness of breath.  Mariane Baumgarten Calcium (STOOL SOFTENER PO) Take by mouth.  . feeding supplement, ENSURE ENLIVE, (ENSURE ENLIVE) LIQD Take 237 mLs by mouth 2 (two) times daily between meals.  . finasteride (PROSCAR) 5 MG tablet Take 1 tablet (5 mg total) by mouth daily.  . Fluticasone Furoate-Vilanterol (BREO ELLIPTA) 100-25 MCG/INH AEPB Inhale 1 puff into the lungs daily.  . furosemide (LASIX) 20 MG tablet Take 1 tablet (20 mg total) by mouth daily. (Patient taking differently: Take 40 mg by mouth daily. )  . levothyroxine (SYNTHROID, LEVOTHROID) 50 MCG tablet Take 50 mcg by mouth daily before breakfast.  . losartan (COZAAR) 50 MG tablet Take 1 tablet (50 mg total) by mouth daily.  . pantoprazole  (PROTONIX) 40 MG tablet Take 40 mg by mouth daily.  Marland Kitchen saccharomyces boulardii (FLORASTOR) 250 MG capsule Take 250 mg by mouth as needed.   . sodium chloride 1 G tablet Take 1 tablet (1 g total) by mouth 2 (two) times daily with a meal.  . tamsulosin (FLOMAX) 0.4 MG CAPS capsule Take 1 capsule (0.4 mg total) by mouth daily.  Marland Kitchen tiotropium (SPIRIVA) 18 MCG inhalation capsule Place 1 capsule (18 mcg total) into inhaler and inhale daily.  . traMADol (ULTRAM) 50 MG tablet Take by mouth at bedtime as needed.  . zolpidem (AMBIEN) 5 MG tablet Take 5 mg by mouth at bedtime as needed for sleep.  . [DISCONTINUED] metoprolol tartrate (LOPRESSOR) 25 MG tablet TAKE 1 TABLET BY MOUTH TWICE DAILY   No facility-administered encounter medications on file as of 10/12/2015.    Review of Systems  Constitutional: Negative for appetite change and unexpected weight change.  HENT: Negative for congestion and sinus pressure.   Respiratory: Negative for cough, chest tightness and shortness of breath.   Cardiovascular: Positive for leg swelling (leg swelling has significantly improved.  ). Negative for chest pain and palpitations.  Gastrointestinal: Negative for nausea, vomiting, abdominal pain and diarrhea.  Genitourinary: Negative for dysuria and difficulty urinating.  Musculoskeletal: Negative for back pain and joint swelling.  Skin: Negative for color change and rash.  Neurological: Negative for dizziness, light-headedness and headaches.  Psychiatric/Behavioral: Negative for dysphoric mood and agitation.       Objective:    Physical Exam  Constitutional: He appears well-developed and well-nourished. No distress.  HENT:  Nose: Nose normal.  Mouth/Throat: Oropharynx is clear and moist.  Eyes: Conjunctivae are normal. Right eye exhibits no discharge. Left eye exhibits no discharge.  Neck: Neck supple. No thyromegaly present.  Cardiovascular: Normal rate and regular rhythm.   Pulmonary/Chest: Effort normal and  breath sounds normal. No respiratory distress.  Abdominal: Soft. Bowel sounds are normal. There is no tenderness.  Musculoskeletal: He exhibits no tenderness.  Minimal lower extremity edema.  Improved.   Lymphadenopathy:    He has no cervical adenopathy.  Skin: No rash noted. No erythema.  Psychiatric: He has a normal mood and affect. His behavior is normal.    BP 118/58 mmHg  Pulse 100  Temp(Src) 98.1 F (36.7 C) (Oral)  Resp 17  Ht '5\' 4"'$  (1.626 m)  Wt 137 lb 8 oz (62.37 kg)  BMI 23.59 kg/m2  SpO2 99% Wt Readings from Last 3 Encounters:  10/12/15 137 lb 8 oz (62.37 kg)  08/10/15 140 lb  8 oz (63.73 kg)  08/03/15 141 lb 12.8 oz (64.32 kg)     Lab Results  Component Value Date   WBC 5.7 07/22/2015   HGB 11.0* 07/22/2015   HCT 34.2* 07/22/2015   PLT 271 07/22/2015   GLUCOSE 124* 07/30/2015   CHOL 223* 06/24/2015   TRIG 53.0 06/24/2015   HDL 96.10 06/24/2015   LDLCALC 116* 06/24/2015   ALT 8* 07/22/2015   AST 15 07/22/2015   NA 129* 07/30/2015   K 4.0 07/30/2015   CL 91* 07/30/2015   CREATININE 0.91 07/30/2015   BUN 16 07/30/2015   CO2 33* 07/30/2015   TSH 1.733 07/13/2015   PSA 0.83 02/06/2015   INR 0.94 11/06/2012   HGBA1C 6.1* 06/26/2015   MICROALBUR 93.8* 06/24/2015       Assessment & Plan:   Problem List Items Addressed This Visit    COPD (chronic obstructive pulmonary disease) ([place])    Breathing stable.  Followed by Dr Alva Garnet.        Diabetes mellitus, type II ([place])    Sugars controlled on current medication.  Follow.        Hypertension - Primary    Blood pressure as outlined.  Same medication regimen.  Follow pressures.  Follow metabolic panel.  Just had checked by nephrology.        Hyponatremia    Doing better.  Follow sodium level.        Hypothyroidism    On thyroid replacement.  Follow tsh.        Irritable bowel syndrome    Has had extensive GI w/up.  Seeing Dr Redmond Pulling.  Weight has improved.  Eating better.  Bowels stable.              [name], MD

## 2015-10-18 ENCOUNTER — Other Ambulatory Visit: Payer: Self-pay | Admitting: Internal Medicine

## 2015-10-18 ENCOUNTER — Encounter: Payer: Self-pay | Admitting: Internal Medicine

## 2015-10-18 NOTE — Assessment & Plan Note (Signed)
Breathing stable.  Followed by Dr Alva Garnet.

## 2015-10-18 NOTE — Assessment & Plan Note (Signed)
Sugars controlled on current medication.  Follow.

## 2015-10-18 NOTE — Assessment & Plan Note (Signed)
On thyroid replacement.  Follow tsh.  

## 2015-10-18 NOTE — Assessment & Plan Note (Signed)
Blood pressure as outlined.  Same medication regimen.  Follow pressures.  Follow metabolic panel.  Just had checked by nephrology.

## 2015-10-18 NOTE — Assessment & Plan Note (Signed)
Has had extensive GI w/up.  Seeing Dr Redmond Pulling.  Weight has improved.  Eating better.  Bowels stable.

## 2015-10-18 NOTE — Assessment & Plan Note (Signed)
Doing better.  Follow sodium level.

## 2015-10-28 ENCOUNTER — Ambulatory Visit (INDEPENDENT_AMBULATORY_CARE_PROVIDER_SITE_OTHER): Payer: Medicare Other | Admitting: Pulmonary Disease

## 2015-10-28 ENCOUNTER — Ambulatory Visit: Payer: Medicare Other | Admitting: Pulmonary Disease

## 2015-10-28 ENCOUNTER — Encounter: Payer: Self-pay | Admitting: Pulmonary Disease

## 2015-10-28 VITALS — BP 124/68 | HR 103 | Ht 64.0 in | Wt 141.6 lb

## 2015-10-28 DIAGNOSIS — G4733 Obstructive sleep apnea (adult) (pediatric): Secondary | ICD-10-CM

## 2015-10-28 DIAGNOSIS — J9622 Acute and chronic respiratory failure with hypercapnia: Secondary | ICD-10-CM | POA: Diagnosis not present

## 2015-10-28 DIAGNOSIS — J449 Chronic obstructive pulmonary disease, unspecified: Secondary | ICD-10-CM

## 2015-10-28 NOTE — Patient Instructions (Signed)
Continue CPAP Continue Breo You may try off of Spiriva and remain off of it if your breathing does not worsen Follow up in 3-4 months

## 2015-10-29 NOTE — Progress Notes (Signed)
PROFILE: Hospitalized 9/29 - 10/10 initially with AMS and hyponatremia. Developed progressive hypersomnolence and was found to be profoundly hypercarbic (PaCO2 162 torr!!). Underwent intubation and mechanical ventilation 9/30 - 07/01/15. PCCM was involved in his care during the time of his time in the ICU. It was felt that the hypercarbic resp failure was due to COPD and he was discharged home on bronchodilator therapy as documented. Pulmonary follow up was arranged. His hospitalization was c/b hypertension and he was eventually discharged on 3 anti-hypertensive medications. On his initial CXR, there was concern for a RUL nodule which was confirmed by CT chest.  Again hospitalized 10/20-10/26/16 for weakness and hyponatremia. Seen in consultation by Dr Stevenson Clinch CXR (10/06): NACPD CT chest (07/20/15): RUL "nodule" smaller than previously noted in Sept PET 07/22/15: 1. No specific findings identified to suggest hypermetabolic/FDG avid tumor. Continued resolution of right upper lobe pulmonary nodule which currently measures 1.3 cm and exhibits non malignant range FDG uptake Spirometry 07/30/15: FVC  2.09 73%      FEV1  1.47 68%    FEV1% 70%   PSG 07/27/15: Moderate OSA (AHI 29) LE venous US 08/02/15: no DVT  INTERVAL HISTORY: No major events  SUBJ: No new respiratory symptoms. No new complaints. Family indicates that he is "100% better". He is complaint with CPAP. He is using Symbicort and Spiriva as prescribed.  OBJ: Filed Vitals:   10/28/15 1314  BP: 124/68  Pulse: 103  Height: '5\' 4"'$  (1.626 m)  Weight: 141 lb 9.6 oz (64.229 kg)  SpO2: 95%   Frail appearing, no respiratory distress HEENT WNL No JVD noted BS markedly diminished without adventitious sounds, no wheezing Reg, no M noted Abd soft, NT, +BS Ext with 2+ RLE pitting edema, 1+ pitting LLE edema Neuro: diffusely weak, no focal deficits  Outpatient Encounter Prescriptions as of 10/28/2015  Medication Sig  . acetaminophen  (TYLENOL) 500 MG tablet Take 500 mg by mouth every 6 (six) hours as needed.  Marland Kitchen albuterol (PROVENTIL) (2.5 MG/3ML) 0.083% nebulizer solution Take 3 mLs (2.5 mg total) by nebulization every 6 (six) hours as needed for wheezing or shortness of breath.  Mariane Baumgarten Calcium (STOOL SOFTENER PO) Take by mouth.  . feeding supplement, ENSURE ENLIVE, (ENSURE ENLIVE) LIQD Take 237 mLs by mouth 2 (two) times daily between meals.  . finasteride (PROSCAR) 5 MG tablet Take 1 tablet (5 mg total) by mouth daily.  . Fluticasone Furoate-Vilanterol (BREO ELLIPTA) 100-25 MCG/INH AEPB Inhale 1 puff into the lungs daily.  . furosemide (LASIX) 20 MG tablet Take 1 tablet (20 mg total) by mouth daily. (Patient taking differently: Take 40 mg by mouth daily. )  . levothyroxine (SYNTHROID, LEVOTHROID) 50 MCG tablet Take 50 mcg by mouth daily before breakfast.  . losartan (COZAAR) 50 MG tablet Take 1 tablet (50 mg total) by mouth daily.  . pantoprazole (PROTONIX) 40 MG tablet Take 40 mg by mouth daily.  Marland Kitchen saccharomyces boulardii (FLORASTOR) 250 MG capsule Take 250 mg by mouth as needed.   . sodium chloride 1 G tablet Take 1 tablet (1 g total) by mouth 2 (two) times daily with a meal.  . tamsulosin (FLOMAX) 0.4 MG CAPS capsule TAKE 1 CAPSULE BY MOUTH ONCE DAILY  . tiotropium (SPIRIVA) 18 MCG inhalation capsule Place 1 capsule (18 mcg total) into inhaler and inhale daily.  . traMADol (ULTRAM) 50 MG tablet Take by mouth at bedtime as needed.  . zolpidem (AMBIEN) 5 MG tablet Take 5 mg by mouth at bedtime as  needed for sleep.   No facility-administered encounter medications on file as of 10/28/2015.   DATA: BMP Latest Ref Rng 07/30/2015 07/22/2015 07/21/2015  Glucose 65 - 99 mg/dL 124(H) 107(H) -  BUN 6 - 20 mg/dL 16 17 -  Creatinine 0.61 - 1.24 mg/dL 0.91 0.71 0.64  Sodium 135 - 145 mmol/L 129(L) 131(L) 130(L)  Potassium 3.5 - 5.1 mmol/L 4.0 4.4 -  Chloride 101 - 111 mmol/L 91(L) 91(L) -  CO2 22 - 32 mmol/L 33(H) 37(H) -   Calcium 8.9 - 10.3 mg/dL 8.8(L) 8.5(L) -    CBC Latest Ref Rng 07/22/2015 07/16/2015 07/12/2015  WBC 3.8 - 10.6 K/uL 5.7 6.3 7.0  Hemoglobin 13.0 - 18.0 g/dL 11.0(L) 13.7 12.8(L)  Hematocrit 40.0 - 52.0 % 34.2(L) 41.2 40.3  Platelets 150 - 440 K/uL 271 304 280    IMPRESSION/PLAN: 1) Chronic hypercarbic respiratory failure. Improved. 2) Suspected COPD. No evidence of bronchospasm currently. Not clear how much bronchodilators are benefiting him  - Cont Breo 100/25. One inhalation daily  - May try off Spiriva and remain off if no deterioration  - Cont albuterol nebulizer PRN  3) RUL nodule - This is benign and requires no further evaluation  4) LE edema - stable to improved  - LE venous Doppler 11/06 negative 5) Moderate OSA, well controlled. His family asks that I assume mgmt of this problem. I will refer back to Dr Ashby Dawes if complications arise. Otherwise, I will oversee mgmt of this problem   ROV 4 months or PRN.  [name], MD [facility]/CCM

## 2015-11-04 ENCOUNTER — Ambulatory Visit: Payer: Medicare Other | Admitting: Internal Medicine

## 2015-11-10 ENCOUNTER — Encounter: Payer: Self-pay | Admitting: Internal Medicine

## 2015-11-11 ENCOUNTER — Encounter: Payer: Self-pay | Admitting: Urology

## 2015-11-11 ENCOUNTER — Ambulatory Visit (INDEPENDENT_AMBULATORY_CARE_PROVIDER_SITE_OTHER): Payer: Medicare Other | Admitting: Urology

## 2015-11-11 VITALS — BP 145/70 | HR 89 | Ht 64.0 in | Wt 142.0 lb

## 2015-11-11 DIAGNOSIS — N138 Other obstructive and reflux uropathy: Secondary | ICD-10-CM

## 2015-11-11 DIAGNOSIS — N401 Enlarged prostate with lower urinary tract symptoms: Secondary | ICD-10-CM

## 2015-11-11 DIAGNOSIS — R351 Nocturia: Secondary | ICD-10-CM

## 2015-11-11 LAB — BLADDER SCAN AMB NON-IMAGING: Scan Result: 110

## 2015-11-11 NOTE — Progress Notes (Signed)
10:27 AM   Aaron Hendrix [birth year] [id number]  Referring provider: [name], MD [address] [place], [place] [address]  Chief Complaint  Patient presents with  . Benign Prostatic Hypertrophy    3 month follow up  . Nocturia    HPI: Patient is an 82 year old Caucasian African American male who presents today for a three months follow up after adding finasteride to his tamsulosin.    Previous history Patient is an 82 year old white male who was seen during a recent admission for hyponatremia with nocturia and was started on tamsulosin for his moderate PVR's of 200 cc.  He was experiencing urinary frequency, urgency, nocturia, leakage of urine, intermittency, hesitancy, straining to urinate and a weak stream.  He has been diagnosed with sleep apnea and is sleeping with his CPAP machine.  A median lobe of the prostate was appreciated on a CT scan in 05/2014.  BPH WITH LUTS His IPSS score today is 13, which is moderate lower urinary tract symptomatology. He is mostly satisfied with his quality life due to his urinary symptoms. His PVR is 110 mL.  He denies any dysuria, hematuria or suprapubic pain.  He currently taking tamsulosin 0.4 mg and finasteride 5 mg daily.   He also denies any recent fevers, chills, nausea or vomiting.  He does not have a family history of PCa.  He has seen an improvement in his nocturia.  He is only getting up twice a night.  This is a reduction from four times a night.        IPSS      11/11/15 1000       International Prostate Symptom Score   How often have you had the sensation of not emptying your bladder? Less than half the time     How often have you had to urinate less than every two hours? Less than half the time     How often have you found you stopped and started again several times when you urinated? Less than 1 in 5 times     How often have you found it difficult to postpone urination? About half the time     How often have you had a weak urinary stream? Less than half the time     How often have you had to strain to start urination? Less than 1 in 5 times     How many times did you typically get up at night to urinate? 2 Times     Total IPSS Score 13     Quality of Life due to urinary symptoms   If you were to spend the rest of your life with your urinary condition just the way it is now how would you feel about that? Mostly Satisfied        Score:  1-7 Mild 8-19 Moderate 20-35 Severe     PMH: Past Medical History  Diagnosis Date  . COPD with asthma ([place])   . Diabetes mellitus     Diet control   . Hyperlipidemia   . Carpal tunnel syndrome   . DJD (degenerative joint disease), cervical   . Vitamin D deficiency   . Tremor, essential   . Internal hemorrhoids   . Personal history of colonic polyps     adenomatous  . IBS (irritable bowel syndrome)   . Peptic ulcer   . Duodenal ulcer, with partial obstruction 08/10/2012  . Numbness and tingling in right hand  started 2 yeas ago  . Hypertension     no medicine needed  . Memory deficit 12/16/2013  . Essential and other specified forms of tremor 12/16/2013  . Polyneuropathy in diabetes(357.2)     Surgical History: Past Surgical History  Procedure Laterality Date  . Anal fissure repair    . Colonoscopy w/ biopsies and polypectomy  8/03, 6/05, 7/09, 9/10    internal hemorrhoids, tubular adenomas, mucosa & lymphoid nodules  . Upper gastrointestinal endoscopy  3/05, 7/09, 9/10,2013    gastritis, duodenitis  . Total hip arthroplasty Right 11/13/2012    Procedure: TOTAL HIP ARTHROPLASTY ANTERIOR APPROACH;  Surgeon: Mcarthur Rossetti, MD;  Location: [facility];  Service: Orthopedics;  Laterality: Right;  Right total hip arthroplasty  . Ankle surgery Right     right- pins placed in    Home Medications:    Medication List       This list is accurate as of: 11/11/15 10:27 AM.  Always use your most recent med list.                acetaminophen 500 MG tablet  Commonly known as:  TYLENOL  Take 500 mg by mouth every 6 (six) hours as needed.     albuterol (2.5 MG/3ML) 0.083% nebulizer solution  Commonly known as:  PROVENTIL  Take 3 mLs (2.5 mg total) by nebulization every 6 (six) hours as needed for wheezing or shortness of breath.     BREO ELLIPTA 100-25 MCG/INH Aepb  Generic drug:  fluticasone furoate-vilanterol  Inhale 1 puff into the lungs daily.     feeding supplement (ENSURE ENLIVE) Liqd  Take 237 mLs by mouth 2 (two) times daily between meals.     finasteride 5 MG tablet  Commonly known as:  PROSCAR  Take 1 tablet (5 mg total) by mouth daily.     furosemide 20 MG tablet  Commonly known as:  LASIX  Take 1 tablet (20 mg total) by mouth daily.     levothyroxine 50 MCG tablet  Commonly known as:  SYNTHROID, LEVOTHROID  Take 50 mcg by mouth daily before breakfast.     losartan 50 MG tablet  Commonly known as:  COZAAR  Take 1 tablet (50 mg total) by mouth daily.     pantoprazole 40 MG tablet  Commonly known as:  PROTONIX  Take 40 mg by mouth daily.     saccharomyces boulardii 250 MG capsule  Commonly known as:  FLORASTOR  Take 250 mg by mouth as needed.     sodium chloride 1 g tablet  Take 1 tablet (1 g total) by mouth 2 (two) times daily with a meal.     STOOL SOFTENER PO  Take by mouth. Reported on 11/11/2015     tamsulosin 0.4 MG Caps capsule  Commonly known as:  FLOMAX  TAKE 1 CAPSULE BY MOUTH ONCE DAILY     tiotropium 18 MCG inhalation capsule  Commonly known as:  SPIRIVA  Place 1 capsule (18 mcg total) into inhaler and inhale daily.     traMADol 50 MG tablet  Commonly known as:  ULTRAM  Take by mouth at bedtime as needed. Reported on 11/11/2015     zolpidem 5 MG tablet  Commonly known as:  AMBIEN  Take 5 mg by mouth at bedtime as needed for sleep. Reported on 11/11/2015        Allergies:  Allergies  Allergen Reactions  . Fexofenadine Other (See Comments)  . Quinapril  Hcl Other (  See Comments)    Unknown   . Latex Rash    Family History: Family History  Problem Relation Age of Onset  . Colon cancer Neg Hx   . Esophageal cancer Neg Hx   . Rectal cancer Neg Hx   . Stomach cancer Neg Hx   . Prostate cancer Neg Hx   . Kidney disease Neg Hx   . Kidney cancer Neg Hx   . Leukemia Maternal Aunt   . Thyroid cancer Daughter 42  . Diabetes Son   . Urolithiasis Son     Social History:  reports that he quit smoking about 52 years ago. His smoking use included Cigarettes. He has never used smokeless tobacco. He reports that he does not drink alcohol or use illicit drugs.  ROS: UROLOGY Frequent Urination?: No Hard to postpone urination?: No Burning/pain with urination?: No Get up at night to urinate?: No Leakage of urine?: No Urine stream starts and stops?: No Trouble starting stream?: No Do you have to strain to urinate?: No Blood in urine?: No Urinary tract infection?: No Sexually transmitted disease?: No Injury to kidneys or bladder?: No Painful intercourse?: No Weak stream?: No Erection problems?: No Penile pain?: No  Gastrointestinal Nausea?: No Vomiting?: No Indigestion/heartburn?: No Diarrhea?: No Constipation?: No  Constitutional Fever: No Night sweats?: No Weight loss?: No Fatigue?: No  Skin Skin rash/lesions?: No Itching?: No  Eyes Blurred vision?: No Double vision?: No  Ears/Nose/Throat Sore throat?: No Sinus problems?: No  Hematologic/Lymphatic Swollen glands?: No Easy bruising?: No  Cardiovascular Leg swelling?: No Chest pain?: No  Respiratory Cough?: No Shortness of breath?: No  Endocrine Excessive thirst?: No  Musculoskeletal Back pain?: No Joint pain?: No  Neurological Headaches?: No Dizziness?: No  Psychologic Depression?: No Anxiety?: No  Physical Exam: BP 145/70 mmHg  Pulse 89  Ht '5\' 4"'$  (1.626 m)  Wt 142 lb (64.411 kg)  BMI 24.36 kg/m2  Constitutional: Well nourished. Alert and  oriented, No acute distress. HEENT: [place] AT, moist mucus membranes. Trachea midline, no masses. Cardiovascular: No clubbing, cyanosis, or edema. Respiratory: Normal respiratory effort, no increased work of breathing. Skin: No rashes, bruises or suspicious lesions. Lymph: No cervical or inguinal adenopathy. Neurologic: Grossly intact, no focal deficits, moving all 4 extremities. Psychiatric: Normal mood and affect.   Laboratory Data: Lab Results  Component Value Date   WBC 5.7 07/22/2015   HGB 11.0* 07/22/2015   HCT 34.2* 07/22/2015   MCV 94.2 07/22/2015   PLT 271 07/22/2015   Lab Results  Component Value Date   CREATININE 0.91 07/30/2015   Lab Results  Component Value Date   PSA 0.83 02/06/2015   Lab Results  Component Value Date   HGBA1C 6.1* 06/26/2015    Results for orders placed or performed in visit on 11/11/15  BLADDER SCAN AMB NON-IMAGING  Result Value Ref Range   Scan Result 110     Assessment & Plan:    1. Nocturia:   Patient has found relief with his CPAP machine. He is now having nocturia 2 which is reduction from nocturia 4. He'll return in 6 months for I PSS score and PVR.  - BLADDER SCAN AMB NON-IMAGING  2. BPH with LUTS:  I PSS score is 13/2.  PVR is 110 mL today.  Which is an improvement from the 248 on 08/10/2015.  He will continue the tamsulosin and finasteride. He will follow-up in 6 months for I PSS score, PVR and exam.  Return in about 6 months (around  05/10/2016) for IPSS score and PVR and exam.  Zara Council, PA-C  [facility] [address], [place], [place] [address] [phone]

## 2015-11-19 ENCOUNTER — Telehealth: Payer: Self-pay | Admitting: Pulmonary Disease

## 2015-11-19 NOTE — Telephone Encounter (Signed)
Patient daughter dropped off contact information for [facility] compliance team  for cpap .  Patient wanted to discuss with [facility].  Please call to discuss. Contaact information for [facility] compliance put on Rhonda's desk.

## 2015-11-19 NOTE — Telephone Encounter (Signed)
Called and spoke with pt's daughter, Aaron Hendrix, in reference to a letter they received from [facility] about the compliance of CPAP.  OV note from 11/03/15 was faxed to [facility] yesterday regarding this issue.  Pt is within his compliance window.  Called to confirm that [facility] received document and [facility] stated that they did and patient account has been cleared, pt meet compliance.  Asked [facility] to contact pt's daughter to advise her as well, since [facility] mailed letter to patient.  [facility] stated that they would give the daughter a call to advise of above. Rhonda J Cobb

## 2015-12-03 ENCOUNTER — Ambulatory Visit (INDEPENDENT_AMBULATORY_CARE_PROVIDER_SITE_OTHER): Payer: Medicare Other | Admitting: Podiatry

## 2015-12-03 ENCOUNTER — Encounter: Payer: Self-pay | Admitting: Podiatry

## 2015-12-03 DIAGNOSIS — M79676 Pain in unspecified toe(s): Secondary | ICD-10-CM | POA: Diagnosis not present

## 2015-12-03 DIAGNOSIS — B351 Tinea unguium: Secondary | ICD-10-CM | POA: Diagnosis not present

## 2015-12-03 NOTE — Progress Notes (Signed)
[name], male   DOB: June 11, 1930, 82 y.o.   MRN: [id number] Subjective: 82 y.o. returns the office today for painful, elongated, thickened toenails which he is unable to trim himself. Denies any redness or drainage around the nails. Denies any acute changes since last appointment and no new complaints today. Denies any systemic complaints such as fevers, chills, nausea, vomiting.   Objective: AAO 3, NAD DP/PT pulses palpable 1/4, CRT less than 3 seconds Moderate edema to bilateral ankles without any associated redness or warmth.  Nails hypertrophic, dystrophic, elongated, brittle, discolored 10. There is tenderness overlying the nails 1-5 bilaterally. There is no surrounding erythema or drainage along the nail sites. No open lesions or pre-ulcerative lesions are identified. No other areas of tenderness bilateral lower extremities. No overlying edema, erythema, increased warmth. No pain with calf compression, swelling, warmth, erythema.  Assessment: Patient presents with symptomatic onychomycosis  Plan: -Treatment options including alternatives, risks, complications were discussed -Nails sharply debrided 10 without complication/bleeding. -Follow-up with PCP as well for swelling.  -Discussed daily foot inspection. If there are any changes, to call the office immediately.  -Follow-up in 3 months or sooner if any problems are to arise. In the meantime, encouraged to call the office with any questions, concerns, changes symptoms.  Celesta Gentile, DPM

## 2016-01-12 ENCOUNTER — Ambulatory Visit (INDEPENDENT_AMBULATORY_CARE_PROVIDER_SITE_OTHER): Payer: Medicare Other | Admitting: Internal Medicine

## 2016-01-12 ENCOUNTER — Encounter: Payer: Self-pay | Admitting: Internal Medicine

## 2016-01-12 VITALS — BP 140/70 | HR 86 | Temp 98.3°F | Resp 18 | Ht 64.0 in | Wt 143.2 lb

## 2016-01-12 DIAGNOSIS — I10 Essential (primary) hypertension: Secondary | ICD-10-CM

## 2016-01-12 DIAGNOSIS — E039 Hypothyroidism, unspecified: Secondary | ICD-10-CM

## 2016-01-12 DIAGNOSIS — E119 Type 2 diabetes mellitus without complications: Secondary | ICD-10-CM

## 2016-01-12 DIAGNOSIS — R21 Rash and other nonspecific skin eruption: Secondary | ICD-10-CM

## 2016-01-12 DIAGNOSIS — Z860100 Personal history of colon polyps, unspecified: Secondary | ICD-10-CM

## 2016-01-12 DIAGNOSIS — J441 Chronic obstructive pulmonary disease with (acute) exacerbation: Secondary | ICD-10-CM | POA: Diagnosis not present

## 2016-01-12 DIAGNOSIS — N138 Other obstructive and reflux uropathy: Secondary | ICD-10-CM

## 2016-01-12 DIAGNOSIS — E871 Hypo-osmolality and hyponatremia: Secondary | ICD-10-CM

## 2016-01-12 DIAGNOSIS — N401 Enlarged prostate with lower urinary tract symptoms: Secondary | ICD-10-CM

## 2016-01-12 NOTE — Progress Notes (Signed)
Pre-visit discussion using our clinic review tool. No additional management support is needed unless otherwise documented below in the visit note.  

## 2016-01-12 NOTE — Progress Notes (Signed)
[name], male   DOB: [birth year], 82 y.o.   MRN: [id number]   Subjective:    [name], male    DOB: 05/11/1930, 82 y.o.   MRN: [id number]  HPI  Patient here for a scheduled follow up.  He is accompanied by his wife.  History obtained from both of them.  He has been doing well.  Eating well.  Feels better.  Stronger.  Getting around well.  Saw dermatology for the nipple rash.  Cream prescribed.  Improved and almost resolved.  Has returned now.  No chest pain.  No sob.  No acid reflux.  No abdominal pain or cramping.  Bowels stable.     Past Medical History  Diagnosis Date  . COPD with asthma ([place])   . Diabetes mellitus     Diet control   . Hyperlipidemia   . Carpal tunnel syndrome   . DJD (degenerative joint disease), cervical   . Vitamin D deficiency   . Tremor, essential   . Internal hemorrhoids   . Personal history of colonic polyps     adenomatous  . IBS (irritable bowel syndrome)   . Peptic ulcer   . Duodenal ulcer, with partial obstruction 08/10/2012  . Numbness and tingling in right hand     started 2 yeas ago  . Hypertension     no medicine needed  . Memory deficit 12/16/2013  . Essential and other specified forms of tremor 12/16/2013  . Polyneuropathy in diabetes(357.2)    Past Surgical History  Procedure Laterality Date  . Anal fissure repair    . Colonoscopy w/ biopsies and polypectomy  8/03, 6/05, 7/09, 9/10    internal hemorrhoids, tubular adenomas, mucosa & lymphoid nodules  . Upper gastrointestinal endoscopy  3/05, 7/09, 9/10,2013    gastritis, duodenitis  . Total hip arthroplasty Right 11/13/2012    Procedure: TOTAL HIP ARTHROPLASTY ANTERIOR APPROACH;  Surgeon: Mcarthur Rossetti, MD;  Location: [facility];  Service: Orthopedics;  Laterality: Right;  Right total hip arthroplasty  . Ankle surgery Right     right- pins placed in   Family History  Problem Relation Age of Onset  . Colon cancer Neg Hx   . Esophageal  cancer Neg Hx   . Rectal cancer Neg Hx   . Stomach cancer Neg Hx   . Prostate cancer Neg Hx   . Kidney disease Neg Hx   . Kidney cancer Neg Hx   . Leukemia Maternal Aunt   . Thyroid cancer Daughter 48  . Diabetes Son   . Urolithiasis Son    Social History   Social History  . Marital Status: Married    Spouse Name: N/A  . Number of Children: 3  . 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 Smoking status: Former Smoker    Types: Cigarettes    Quit date: 07/02/1963  . Smokeless tobacco: Never Used  . Alcohol Use: No  . Drug Use: No  . Sexual Activity: Not Asked   Other Topics Concern  . None   Social History Narrative   Veteran [employer]    Outpatient Encounter Prescriptions as of 01/12/2016  Medication Sig  . acetaminophen (TYLENOL) 500 MG tablet Take 500 mg by mouth every 6 (six) hours as needed.  Mariane Baumgarten Calcium (STOOL SOFTENER PO) Take by mouth. Reported on 11/11/2015  . feeding  supplement, ENSURE ENLIVE, (ENSURE ENLIVE) LIQD Take 237 mLs by mouth 2 (two) times daily between meals.  . finasteride (PROSCAR) 5 MG tablet Take 1 tablet (5 mg total) by mouth daily.  . Fluticasone Furoate-Vilanterol (BREO ELLIPTA) 100-25 MCG/INH AEPB Inhale 1 puff into the lungs daily.  . furosemide (LASIX) 20 MG tablet Take 1 tablet (20 mg total) by mouth daily. (Patient taking differently: Take 40 mg by mouth daily. )  . levothyroxine (SYNTHROID, LEVOTHROID) 50 MCG tablet Take 50 mcg by mouth daily before breakfast.  . losartan (COZAAR) 50 MG tablet Take 1 tablet (50 mg total) by mouth daily.  . pantoprazole (PROTONIX) 40 MG tablet Take 40 mg by mouth daily.  Marland Kitchen saccharomyces boulardii (FLORASTOR) 250 MG capsule Take 250 mg by mouth as needed.   . tamsulosin (FLOMAX) 0.4 MG CAPS capsule TAKE 1 CAPSULE BY MOUTH ONCE DAILY  . zolpidem (AMBIEN) 5 MG tablet Take 5 mg by mouth at bedtime as needed for sleep. Reported on  11/11/2015  . [DISCONTINUED] albuterol (PROVENTIL) (2.5 MG/3ML) 0.083% nebulizer solution Take 3 mLs (2.5 mg total) by nebulization every 6 (six) hours as needed for wheezing or shortness of breath.  . [DISCONTINUED] sodium chloride 1 G tablet Take 1 tablet (1 g total) by mouth 2 (two) times daily with a meal. (Patient not taking: Reported on 11/11/2015)  . [DISCONTINUED] tiotropium (SPIRIVA) 18 MCG inhalation capsule Place 1 capsule (18 mcg total) into inhaler and inhale daily. (Patient not taking: Reported on 11/11/2015)  . [DISCONTINUED] traMADol (ULTRAM) 50 MG tablet Take by mouth at bedtime as needed. Reported on 11/11/2015   No facility-administered encounter medications on file as of 01/12/2016.    Review of Systems  Constitutional: Negative for appetite change (eating better. ) and unexpected weight change.  HENT: Negative for congestion and sinus pressure.   Respiratory: Negative for cough, chest tightness and shortness of breath.   Cardiovascular: Negative for chest pain, palpitations and leg swelling.  Gastrointestinal: Negative for nausea, abdominal pain and diarrhea.  Genitourinary: Negative for dysuria and difficulty urinating.  Musculoskeletal: Negative for back pain and joint swelling.  Skin: Negative for color change and rash.  Neurological: Negative for dizziness, light-headedness and headaches.  Psychiatric/Behavioral: Negative for dysphoric mood and agitation.       Objective:    Physical Exam  Constitutional: He appears well-developed and well-nourished. No distress.  HENT:  Nose: Nose normal.  Mouth/Throat: Oropharynx is clear and moist.  Neck: Neck supple. No thyromegaly present.  Cardiovascular: Normal rate and regular rhythm.   Pulmonary/Chest: Effort normal and breath sounds normal. No respiratory distress.  Abdominal: Soft. Bowel sounds are normal. There is no tenderness.  Musculoskeletal: He exhibits no edema or tenderness.  Lymphadenopathy:    He has no  cervical adenopathy.  Skin: No erythema.  Right areola - rash.  Non tender.    Psychiatric: He has a normal mood and affect. His behavior is normal.    BP 140/70 mmHg  Pulse 86  Temp(Src) 98.3 F (36.8 C) (Oral)  Resp 18  Ht _0  (1.626 m)  Wt 143 lb 4 oz (64.978 kg)  BMI 24.58 kg/m2  SpO2 94% Wt Readings from Last 3 Encounters:  01/12/16 143 lb 4 oz (64.978 kg)  11/11/15 142 lb (64.411 kg)  10/28/15 141 lb 9.6 oz (64.229 kg)     Lab Results  Component Value Date   WBC 5.7 07/22/2015   HGB 11.0* 07/22/2015   HCT 34.2* 07/22/2015   PLT  271 07/22/2015   GLUCOSE 124* 07/30/2015   CHOL 223* 06/24/2015   TRIG 53.0 06/24/2015   HDL 96.10 06/24/2015   LDLCALC 116* 06/24/2015   ALT 8* 07/22/2015   AST 15 07/22/2015   NA 129* 07/30/2015   K 4.0 07/30/2015   CL 91* 07/30/2015   CREATININE 0.91 07/30/2015   BUN 16 07/30/2015   CO2 33* 07/30/2015   TSH 1.733 07/13/2015   PSA 0.83 02/06/2015   INR 0.94 11/06/2012   HGBA1C 6.1* 06/26/2015   MICROALBUR 93.8* 06/24/2015       Assessment & Plan:   Problem List Items Addressed This Visit    BPH with obstruction/lower urinary tract symptoms    Followed by urology.       COPD (chronic obstructive pulmonary disease) ([place])    Followed by Dr Alva Garnet.  Stable.       Diabetes mellitus, type II ([place])    Eating better.  Follow sugars.  Follow met b and a1c.       History of colonic polyps    Colonoscopy 02/10/15 as outlined.        Hypertension - Primary    Blood pressure has been under reasonable control.  Same medication regimen.  Follow pressures.  Follow metabolic panel.        Hyponatremia    Followed by nephrology.  Sodium wnl now.  Follow.       Hypothyroidism    On thyroid replacement.  Follow tsh.       Rash    Areolar rash.  They will contact dermatology and get name of cream for refill.  Will f/u if incomplete resolution.            [name], MD

## 2016-01-18 ENCOUNTER — Encounter: Payer: Self-pay | Admitting: Internal Medicine

## 2016-01-18 DIAGNOSIS — R21 Rash and other nonspecific skin eruption: Secondary | ICD-10-CM | POA: Insufficient documentation

## 2016-01-18 NOTE — Assessment & Plan Note (Signed)
Followed by urology.   

## 2016-01-18 NOTE — Assessment & Plan Note (Signed)
On thyroid replacement.  Follow tsh.  

## 2016-01-18 NOTE — Assessment & Plan Note (Signed)
Followed by nephrology.  Sodium wnl now.  Follow.

## 2016-01-18 NOTE — Assessment & Plan Note (Addendum)
Eating better.  Follow sugars.  Follow met b and a1c.

## 2016-01-18 NOTE — Assessment & Plan Note (Signed)
Colonoscopy 02/10/15 as outlined.

## 2016-01-18 NOTE — Assessment & Plan Note (Signed)
Blood pressure has been under reasonable control.  Same medication regimen.  Follow pressures.  Follow metabolic panel.

## 2016-01-18 NOTE — Assessment & Plan Note (Signed)
Areolar rash.  They will contact dermatology and get name of cream for refill.  Will f/u if incomplete resolution.

## 2016-01-18 NOTE — Assessment & Plan Note (Signed)
Followed by Dr Alva Garnet.  Stable.

## 2016-01-23 ENCOUNTER — Other Ambulatory Visit: Payer: Self-pay | Admitting: Internal Medicine

## 2016-02-08 ENCOUNTER — Other Ambulatory Visit: Payer: Self-pay

## 2016-02-08 MED ORDER — PANTOPRAZOLE SODIUM 40 MG PO TBEC: 40.0000 mg | DELAYED_RELEASE_TABLET | Freq: Every day | ORAL | Status: DC

## 2016-02-26 ENCOUNTER — Telehealth: Payer: Self-pay | Admitting: Pulmonary Disease

## 2016-02-26 NOTE — Telephone Encounter (Signed)
lmov to call office to r/s appt 6/15 with Dr. Alva Garnet.  Schedule changed and provider out of office.  Can see Kasa same day or r/s with Simonds .

## 2016-03-03 ENCOUNTER — Encounter: Payer: Self-pay | Admitting: Podiatry

## 2016-03-03 ENCOUNTER — Ambulatory Visit (INDEPENDENT_AMBULATORY_CARE_PROVIDER_SITE_OTHER): Payer: Medicare Other | Admitting: Podiatry

## 2016-03-03 DIAGNOSIS — M79676 Pain in unspecified toe(s): Secondary | ICD-10-CM

## 2016-03-03 DIAGNOSIS — B351 Tinea unguium: Secondary | ICD-10-CM | POA: Diagnosis not present

## 2016-03-06 NOTE — Progress Notes (Signed)
Patient ID: CAMREN LIPSETT, male   DOB: Sep 17, 1930, 82 y.o.   MRN: [id number]  Subjective: 82 y.o. returns the office today for painful, elongated, thickened toenails which he is unable to trim himself. Denies any redness or drainage around the nails. Denies any acute changes since last appointment and no new complaints today. Denies any systemic complaints such as fevers, chills, nausea, vomiting.   Objective: AAO 3, NAD DP/PT pulses palpable 1/4, CRT less than 3 seconds Moderate edema to bilateral ankles without any associated redness or warmth.  Nails hypertrophic, dystrophic, elongated, brittle, discolored 10. There is tenderness overlying the nails 1-5 bilaterally. There is no surrounding erythema or drainage along the nail sites. No open lesions or pre-ulcerative lesions are identified. No other areas of tenderness bilateral lower extremities. No overlying edema, erythema, increased warmth. No pain with calf compression, swelling, warmth, erythema.  Assessment: Patient presents with symptomatic onychomycosis  Plan: -Treatment options including alternatives, risks, complications were discussed -Nails sharply debrided 10 without complication/bleeding. -Discussed daily foot inspection. If there are any changes, to call the office immediately.  -Follow-up in 3 months or sooner if any problems are to arise. In the meantime, encouraged to call the office with any questions, concerns, changes symptoms.  Celesta Gentile, DPM

## 2016-03-10 ENCOUNTER — Ambulatory Visit: Payer: Medicare Other | Admitting: Pulmonary Disease

## 2016-03-10 ENCOUNTER — Ambulatory Visit: Payer: Medicare Other | Admitting: Podiatry

## 2016-03-16 ENCOUNTER — Encounter: Payer: Self-pay | Admitting: Pulmonary Disease

## 2016-03-16 ENCOUNTER — Ambulatory Visit (INDEPENDENT_AMBULATORY_CARE_PROVIDER_SITE_OTHER): Payer: Medicare Other | Admitting: Pulmonary Disease

## 2016-03-16 VITALS — BP 128/60 | HR 74 | Ht 63.0 in | Wt 144.0 lb

## 2016-03-16 DIAGNOSIS — J449 Chronic obstructive pulmonary disease, unspecified: Secondary | ICD-10-CM

## 2016-03-16 DIAGNOSIS — G4733 Obstructive sleep apnea (adult) (pediatric): Secondary | ICD-10-CM | POA: Diagnosis not present

## 2016-03-17 ENCOUNTER — Encounter: Payer: Self-pay | Admitting: Pulmonary Disease

## 2016-03-17 NOTE — Progress Notes (Signed)
PROFILE: Hospitalized 9/29 - 10/10 initially with AMS and hyponatremia. Developed progressive hypersomnolence and was found to be profoundly hypercarbic (PaCO2 162 torr!!). Underwent intubation and mechanical ventilation 9/30 - 07/01/15. PCCM was involved in his care during the time of his time in the ICU. It was felt that the hypercarbic resp failure was due to COPD and he was discharged home on bronchodilator therapy as documented. Pulmonary follow up was arranged. His hospitalization was c/b hypertension and he was eventually discharged on 3 anti-hypertensive medications. On his initial CXR, there was concern for a RUL nodule which was confirmed by CT chest.  Again hospitalized 10/20-10/26/16 for weakness and hyponatremia. Seen in consultation by Dr Stevenson Clinch CXR (10/06): NACPD CT chest (07/20/15): RUL "nodule" smaller than previously noted in Sept PET 07/22/15: 1. No specific findings identified to suggest hypermetabolic/FDG avid tumor. Continued resolution of right upper lobe pulmonary nodule which currently measures 1.3 cm and exhibits non malignant range FDG uptake Spirometry 07/30/15: FVC  2.09 73%      FEV1  1.47 68%    FEV1% 70%   PSG 07/27/15: Moderate OSA (AHI 29) LE venous US 08/02/15: no DVT  PROBLEMS: COPD - mild obstruction OSA  INTERVAL HISTORY: No major events  SUBJ: No new complaints. No new respiratory symptoms. Compliant with CPAP. Presently on Breo. Rarely uses rescue inhaler. Denies CP, fever, purulent sputum, hemoptysis, LE edema and calf tenderness.   OBJ: Filed Vitals:   03/16/16 0925  BP: 128/60  Pulse: 74  Height: '5\' 3"'$  (1.6 m)  Weight: 144 lb (65.318 kg)  SpO2: 94%   NAD HEENT WNL No JVD noted BS diminished without adventitious sounds, no wheezing Reg, no M noted Abd soft, NT, +BS Ext 1-2+ BLE pitting edema Neuro: diffusely weak, no focal deficits  Outpatient Encounter Prescriptions as of 03/16/2016  Medication Sig  . acetaminophen (TYLENOL) 500 MG  tablet Take 500 mg by mouth every 6 (six) hours as needed.  Mariane Baumgarten Calcium (STOOL SOFTENER PO) Take by mouth. Reported on 11/11/2015  . feeding supplement, ENSURE ENLIVE, (ENSURE ENLIVE) LIQD Take 237 mLs by mouth 2 (two) times daily between meals.  . finasteride (PROSCAR) 5 MG tablet Take 1 tablet (5 mg total) by mouth daily.  . Fluticasone Furoate-Vilanterol (BREO ELLIPTA) 100-25 MCG/INH AEPB Inhale 1 puff into the lungs daily.  . furosemide (LASIX) 20 MG tablet Take 1 tablet (20 mg total) by mouth daily. (Patient taking differently: Take 40 mg by mouth daily. )  . levothyroxine (SYNTHROID, LEVOTHROID) 50 MCG tablet Take 50 mcg by mouth daily before breakfast.  . losartan (COZAAR) 50 MG tablet Take 1 tablet (50 mg total) by mouth daily.  . pantoprazole (PROTONIX) 40 MG tablet Take 1 tablet (40 mg total) by mouth daily.  Marland Kitchen saccharomyces boulardii (FLORASTOR) 250 MG capsule Take 250 mg by mouth as needed.   . tamsulosin (FLOMAX) 0.4 MG CAPS capsule TAKE 1 CAPSULE BY MOUTH ONCE DAILY  . zolpidem (AMBIEN) 5 MG tablet Take 5 mg by mouth at bedtime as needed for sleep. Reported on 11/11/2015   No facility-administered encounter medications on file as of 03/16/2016.   DATA: BMP Latest Ref Rng 07/30/2015 07/22/2015 07/21/2015  Glucose 65 - 99 mg/dL 124(H) 107(H) -  BUN 6 - 20 mg/dL 16 17 -  Creatinine 0.61 - 1.24 mg/dL 0.91 0.71 0.64  Sodium 135 - 145 mmol/L 129(L) 131(L) 130(L)  Potassium 3.5 - 5.1 mmol/L 4.0 4.4 -  Chloride 101 - 111 mmol/L 91(L) 91(L) -  CO2 22 - 32 mmol/L 33(H) 37(H) -  Calcium 8.9 - 10.3 mg/dL 8.8(L) 8.5(L) -    CBC Latest Ref Rng 07/22/2015 07/16/2015 07/12/2015  WBC 3.8 - 10.6 K/uL 5.7 6.3 7.0  Hemoglobin 13.0 - 18.0 g/dL 11.0(L) 13.7 12.8(L)  Hematocrit 40.0 - 52.0 % 34.2(L) 41.2 40.3  Platelets 150 - 440 K/uL 271 304 280    IMPRESSION/PLAN: 1) Chronic hypercarbic respiratory failure. Improved. 2) Mild COPD. No evidence of bronchospasm currently.  - Cont Breo  100/25. One inhalation daily  - Cont albuterol nebulizer PRN  3) RUL nodule - This is benign and requires no further evaluation  4) LE edema - stable to improved  - LE venous Doppler 11/06 negative 5) Moderate OSA, well controlled. He is compliant with CPAP.   ROV 4-6 months or PRN.  Merton Border, MD PCCM service Mobile [phone] Pager [phone] 03/17/2016

## 2016-04-20 ENCOUNTER — Ambulatory Visit: Payer: Medicare Other | Admitting: Internal Medicine

## 2016-04-21 ENCOUNTER — Ambulatory Visit (INDEPENDENT_AMBULATORY_CARE_PROVIDER_SITE_OTHER): Payer: Medicare Other | Admitting: Internal Medicine

## 2016-04-21 ENCOUNTER — Encounter: Payer: Self-pay | Admitting: Internal Medicine

## 2016-04-21 VITALS — BP 120/80 | HR 84 | Temp 98.0°F | Resp 17 | Ht 63.0 in | Wt 142.5 lb

## 2016-04-21 DIAGNOSIS — S50311A Abrasion of right elbow, initial encounter: Secondary | ICD-10-CM

## 2016-04-21 DIAGNOSIS — E871 Hypo-osmolality and hyponatremia: Secondary | ICD-10-CM

## 2016-04-21 DIAGNOSIS — E119 Type 2 diabetes mellitus without complications: Secondary | ICD-10-CM | POA: Diagnosis not present

## 2016-04-21 DIAGNOSIS — L989 Disorder of the skin and subcutaneous tissue, unspecified: Secondary | ICD-10-CM

## 2016-04-21 DIAGNOSIS — Z0000 Encounter for general adult medical examination without abnormal findings: Secondary | ICD-10-CM

## 2016-04-21 DIAGNOSIS — R634 Abnormal weight loss: Secondary | ICD-10-CM

## 2016-04-21 DIAGNOSIS — E039 Hypothyroidism, unspecified: Secondary | ICD-10-CM

## 2016-04-21 DIAGNOSIS — I10 Essential (primary) hypertension: Secondary | ICD-10-CM

## 2016-04-21 DIAGNOSIS — J441 Chronic obstructive pulmonary disease with (acute) exacerbation: Secondary | ICD-10-CM

## 2016-04-21 LAB — CBC WITH DIFFERENTIAL/PLATELET
BASOS PCT: 0.3 % (ref 0.0–3.0)
Basophils Absolute: 0 10*3/uL (ref 0.0–0.1)
EOS ABS: 0.2 10*3/uL (ref 0.0–0.7)
EOS PCT: 2.2 % (ref 0.0–5.0)
HEMATOCRIT: 40.8 % (ref 39.0–52.0)
HEMOGLOBIN: 13.2 g/dL (ref 13.0–17.0)
LYMPHS PCT: 11.5 % — AB (ref 12.0–46.0)
Lymphs Abs: 0.9 10*3/uL (ref 0.7–4.0)
MCHC: 32.3 g/dL (ref 30.0–36.0)
MCV: 96 fl (ref 78.0–100.0)
MONOS PCT: 6.5 % (ref 3.0–12.0)
Monocytes Absolute: 0.5 10*3/uL (ref 0.1–1.0)
NEUTROS ABS: 6 10*3/uL (ref 1.4–7.7)
Neutrophils Relative %: 79.5 % — ABNORMAL HIGH (ref 43.0–77.0)
PLATELETS: 284 10*3/uL (ref 150.0–400.0)
RBC: 4.25 Mil/uL (ref 4.22–5.81)
RDW: 13.4 % (ref 11.5–15.5)
WBC: 7.6 10*3/uL (ref 4.0–10.5)

## 2016-04-21 LAB — HEMOGLOBIN A1C: HEMOGLOBIN A1C: 5.8 % (ref 4.6–6.5)

## 2016-04-21 LAB — COMPREHENSIVE METABOLIC PANEL
ALBUMIN: 4.3 g/dL (ref 3.5–5.2)
ALT: 9 U/L (ref 0–53)
AST: 18 U/L (ref 0–37)
Alkaline Phosphatase: 58 U/L (ref 39–117)
BILIRUBIN TOTAL: 0.4 mg/dL (ref 0.2–1.2)
BUN: 24 mg/dL — ABNORMAL HIGH (ref 6–23)
CALCIUM: 9.8 mg/dL (ref 8.4–10.5)
CHLORIDE: 92 meq/L — AB (ref 96–112)
CO2: 38 mEq/L — ABNORMAL HIGH (ref 19–32)
CREATININE: 1.02 mg/dL (ref 0.40–1.50)
GFR: 89.56 mL/min (ref >60.00)
Glucose, Bld: 100 mg/dL — ABNORMAL HIGH (ref 70–99)
Potassium: 4.2 mEq/L (ref 3.5–5.1)
Sodium: 136 mEq/L (ref 135–145)
Total Protein: 7.4 g/dL (ref 6.0–8.3)

## 2016-04-21 LAB — TSH: TSH: 1.38 u[IU]/mL (ref 0.35–4.50)

## 2016-04-21 MED ORDER — PANTOPRAZOLE SODIUM 40 MG PO TBEC: 40.0000 mg | DELAYED_RELEASE_TABLET | Freq: Every day | ORAL | 11 refills | Status: DC

## 2016-04-21 NOTE — Progress Notes (Signed)
Pre-visit discussion using our clinic review tool. No additional management support is needed unless otherwise documented below in the visit note.  

## 2016-04-21 NOTE — Progress Notes (Signed)
[name], male   DOB: 02/23/1930, 82 y.o.   MRN: [id number]   Subjective:    [name], male    DOB: 08-16-1930, 82 y.o.   MRN: [id number]  HPI  Patient here for a scheduled follow up.  He is accompanied by his son and his wife.  History obtained from all of them.  He is doing well.  Feels better.  Energy is better.  Has healing elbow abrasion.  Was seen at acute care.  Healing.  Saw Dr Alva Garnet recently.  Breathing better.  Continues on [place].  No chest pain.  No acid reflux.  No abdominal pain or cramping.  Bowels stable.  Due to f/u with Dr Florene Glen (nephrology) 05/11/16 and Dr Forde Dandy 06/01/16.  Sugar has been doing well.     Past Medical History:  Diagnosis Date  . Carpal tunnel syndrome   . COPD with asthma ([place])   . Diabetes mellitus    Diet control   . DJD (degenerative joint disease), cervical   . Duodenal ulcer, with partial obstruction 08/10/2012  . Essential and other specified forms of tremor 12/16/2013  . Hyperlipidemia   . Hypertension    no medicine needed  . IBS (irritable bowel syndrome)   . Internal hemorrhoids   . Memory deficit 12/16/2013  . Numbness and tingling in right hand    started 2 yeas ago  . Peptic ulcer   . Personal history of colonic polyps    adenomatous  . Polyneuropathy in diabetes(357.2)   . Tremor, essential   . Vitamin D deficiency    Past Surgical History:  Procedure Laterality Date  . ANAL FISSURE REPAIR    . ANKLE SURGERY Right    right- pins placed in  . COLONOSCOPY W/ BIOPSIES AND POLYPECTOMY  8/03, 6/05, 7/09, 9/10   internal hemorrhoids, tubular adenomas, mucosa & lymphoid nodules  . TOTAL HIP ARTHROPLASTY Right 11/13/2012   Procedure: TOTAL HIP ARTHROPLASTY ANTERIOR APPROACH;  Surgeon: Mcarthur Rossetti, MD;  Location: [facility];  Service: Orthopedics;  Laterality: Right;  Right total hip arthroplasty  . UPPER GASTROINTESTINAL ENDOSCOPY  3/05, 7/09, 9/10,2013   gastritis, duodenitis   Family  History  Problem Relation Age of Onset  . Thyroid cancer Daughter 82  . Diabetes Son   . Urolithiasis Son   . Leukemia Maternal Aunt   . Colon cancer Neg Hx   . Esophageal cancer Neg Hx   . Rectal cancer Neg Hx   . Stomach cancer Neg Hx   . Prostate cancer Neg Hx   . Kidney disease Neg Hx   . Kidney cancer Neg Hx    Social History   Social History  . Marital status: Married    Spouse name: N/A  . Number of children: 3  . Years of education: MA   Occupational History  . 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 Smoking status: Former Smoker    Types: Cigarettes    Quit date: 07/02/1963  . Smokeless tobacco: Never Used  . Alcohol use No  . Drug use: No  . Sexual activity: Not Asked   Other Topics Concern  . None   Social History Narrative   Veteran [employer]    Outpatient Encounter Prescriptions as of 04/21/2016  Medication Sig  . acetaminophen (TYLENOL) 500 MG tablet Take 500 mg by mouth every 6 (six) hours as needed.  . Docusate Calcium (STOOL  SOFTENER PO) Take by mouth. Reported on 11/11/2015  . feeding supplement, ENSURE ENLIVE, (ENSURE ENLIVE) LIQD Take 237 mLs by mouth 2 (two) times daily between meals.  . finasteride (PROSCAR) 5 MG tablet Take 1 tablet (5 mg total) by mouth daily.  . Fluticasone Furoate-Vilanterol (BREO ELLIPTA) 100-25 MCG/INH AEPB Inhale 1 puff into the lungs daily.  . furosemide (LASIX) 20 MG tablet Take 1 tablet (20 mg total) by mouth daily. (Patient taking differently: Take 40 mg by mouth daily. )  . levothyroxine (SYNTHROID, LEVOTHROID) 50 MCG tablet Take 50 mcg by mouth daily before breakfast.  . losartan (COZAAR) 50 MG tablet Take 1 tablet (50 mg total) by mouth daily.  . pantoprazole (PROTONIX) 40 MG tablet Take 1 tablet (40 mg total) by mouth daily.  Marland Kitchen saccharomyces boulardii (FLORASTOR) 250 MG capsule Take 250 mg by mouth as needed.   . tamsulosin (FLOMAX) 0.4 MG CAPS capsule TAKE 1 CAPSULE BY MOUTH ONCE DAILY    . zolpidem (AMBIEN) 5 MG tablet Take 5 mg by mouth at bedtime as needed for sleep. Reported on 11/11/2015  . [DISCONTINUED] pantoprazole (PROTONIX) 40 MG tablet Take 1 tablet (40 mg total) by mouth daily.   No facility-administered encounter medications on file as of 04/21/2016.     Review of Systems  Constitutional: Negative for appetite change and unexpected weight change.  HENT: Negative for congestion and sinus pressure.   Respiratory: Negative for cough, chest tightness and shortness of breath.   Cardiovascular: Negative for chest pain, palpitations and leg swelling.  Gastrointestinal: Negative for abdominal pain, diarrhea, nausea and vomiting.  Genitourinary: Negative for difficulty urinating and dysuria.  Musculoskeletal: Negative for back pain and joint swelling.  Skin: Negative for color change and rash.  Neurological: Negative for dizziness, light-headedness and headaches.  Psychiatric/Behavioral: Negative for agitation and dysphoric mood.       Objective:    Physical Exam  Constitutional: He appears well-developed and well-nourished. No distress.  HENT:  Nose: Nose normal.  Mouth/Throat: Oropharynx is clear and moist.  Neck: Neck supple.  Cardiovascular: Normal rate and regular rhythm.   Pulmonary/Chest: Effort normal and breath sounds normal. No respiratory distress.  Abdominal: Soft. Bowel sounds are normal. There is no tenderness.  Musculoskeletal: He exhibits no edema or tenderness.  Lymphadenopathy:    He has no cervical adenopathy.  Skin: No rash noted. No erythema.  Psychiatric: He has a normal mood and affect. His behavior is normal.    BP 120/80 (BP Location: Right Arm, Patient Position: Sitting, Cuff Size: Normal)   Pulse 84   Temp 98 F (36.7 C) (Oral)   Resp 17   Ht '5\' 3"'$  (1.6 m)   Wt 142 lb 8 oz (64.6 kg)   SpO2 (!) 82%   BMI 25.24 kg/m  Wt Readings from Last 3 Encounters:  04/21/16 142 lb 8 oz (64.6 kg)  03/16/16 144 lb (65.3 kg)  01/12/16  143 lb 4 oz (65 kg)     Lab Results  Component Value Date   WBC 7.6 04/21/2016   HGB 13.2 04/21/2016   HCT 40.8 04/21/2016   PLT 284.0 04/21/2016   GLUCOSE 100 (H) 04/21/2016   CHOL 223 (H) 06/24/2015   TRIG 53.0 06/24/2015   HDL 96.10 06/24/2015   LDLCALC 116 (H) 06/24/2015   ALT 9 04/21/2016   AST 18 04/21/2016   NA 136 04/21/2016   K 4.2 04/21/2016   CL 92 (L) 04/21/2016   CREATININE 1.02 04/21/2016   BUN  24 (H) 04/21/2016   CO2 38 (H) 04/21/2016   TSH 1.38 04/21/2016   PSA 0.83 02/06/2015   INR 0.94 11/06/2012   HGBA1C 5.8 04/21/2016   MICROALBUR 93.8 (H) 06/24/2015       Assessment & Plan:   Problem List Items Addressed This Visit    COPD (chronic obstructive pulmonary disease) ([place])    Followed by Dr Alva Garnet.  Stable. Continue breo.       Diabetes mellitus ([place])   Relevant Orders   Hemoglobin A1c (Completed)   Elbow abrasion    Healing.  Follow.       Health care maintenance   Hypertension    Blood pressure under good control.  Continue same medication regimen.  Follow pressures.  Follow metabolic panel.        Hyponatremia    Has been followed by nephrology.  Recheck sodium today.  Has been better.  Eating well.        Relevant Orders   CBC with Differential/Platelet (Completed)   Comprehensive metabolic panel (Completed)   Hypothyroidism - Primary    On thyroid replacement.  Follow tsh.       Relevant Orders   TSH (Completed)   Loss of weight    Weight stable now.  Eating well.  Follow.       Skin lesion    Other Visit Diagnoses   None.      [name], MD

## 2016-04-22 ENCOUNTER — Encounter: Payer: Self-pay | Admitting: *Deleted

## 2016-04-23 ENCOUNTER — Encounter: Payer: Self-pay | Admitting: Internal Medicine

## 2016-04-23 NOTE — Assessment & Plan Note (Signed)
Sugars have been doing better.  Low carb diet.  Stay active.  Follow metabolic panel and R3U.

## 2016-04-23 NOTE — Assessment & Plan Note (Signed)
Followed by Dr Alva Garnet.  Stable. Continue breo.

## 2016-04-23 NOTE — Assessment & Plan Note (Signed)
Healing.  Follow.

## 2016-04-23 NOTE — Assessment & Plan Note (Signed)
Blood pressure under good control.  Continue same medication regimen.  Follow pressures.  Follow metabolic panel.   

## 2016-04-23 NOTE — Assessment & Plan Note (Signed)
On thyroid replacement.  Follow tsh.  

## 2016-04-23 NOTE — Assessment & Plan Note (Signed)
Has been followed by nephrology.  Recheck sodium today.  Has been better.  Eating well.

## 2016-04-23 NOTE — Assessment & Plan Note (Signed)
Weight stable now.  Eating well.  Follow.

## 2016-04-30 ENCOUNTER — Other Ambulatory Visit: Payer: Self-pay | Admitting: Internal Medicine

## 2016-05-11 ENCOUNTER — Encounter: Payer: Self-pay | Admitting: Urology

## 2016-05-11 ENCOUNTER — Ambulatory Visit (INDEPENDENT_AMBULATORY_CARE_PROVIDER_SITE_OTHER): Payer: Medicare Other | Admitting: Urology

## 2016-05-11 VITALS — BP 125/71 | HR 93 | Ht 63.0 in | Wt 144.2 lb

## 2016-05-11 DIAGNOSIS — N401 Enlarged prostate with lower urinary tract symptoms: Secondary | ICD-10-CM

## 2016-05-11 DIAGNOSIS — R351 Nocturia: Secondary | ICD-10-CM

## 2016-05-11 DIAGNOSIS — N138 Other obstructive and reflux uropathy: Secondary | ICD-10-CM

## 2016-05-11 NOTE — Progress Notes (Signed)
11:09 AM   Aaron Hendrix Sep 06, 1930 [id number]  Referring provider: [name], MD [address] [place], [place] [address]  Chief Complaint  Patient presents with  . Benign Prostatic Hypertrophy    6 month follow up  . Nocturia    HPI: Patient is an 82 year old  African American male who presents today for a three months follow up after adding finasteride to his tamsulosin.    Previous history Patient is an 82 year old white male who was seen during a recent admission for hyponatremia with nocturia and was started on tamsulosin for his moderate PVR's of 200 cc.  He was experiencing urinary frequency, urgency, nocturia, leakage of urine, intermittency, hesitancy, straining to urinate and a weak stream.  He has been diagnosed with sleep apnea and is sleeping with his CPAP machine.  A median lobe of the prostate was appreciated on a CT scan in 05/2014.  BPH WITH LUTS His IPSS score today is 12, which is moderate lower urinary tract symptomatology. He is pleased with his quality life due to his urinary symptoms. His previous IPSS score was 13/2.  His previous PVR was 110 mL.  He denies any dysuria, hematuria or suprapubic pain.  He currently taking tamsulosin 0.4 mg and finasteride 5 mg daily.   He also denies any recent fevers, chills, nausea or vomiting.  He does not have a family history of PCa.  He has seen an improvement in his nocturia.  He is only getting up twice a night.  This is a reduction from four times a night.  He would like to stop the tamsulosin.        IPSS    Row Name 05/11/16 1000         International Prostate Symptom Score   How often have you had the sensation of not emptying your bladder? More than half the time     How often have you had to urinate less than every two hours? Less than half the time     How often have you found you stopped and started again several times when you urinated? Less than 1 in 5 times     How often  have you found it difficult to postpone urination? Less than half the time     How often have you had a weak urinary stream? Not at All     How often have you had to strain to start urination? Not at All     How many times did you typically get up at night to urinate? 3 Times     Total IPSS Score 12       Quality of Life due to urinary symptoms   If you were to spend the rest of your life with your urinary condition just the way it is now how would you feel about that? Pleased        Score:  1-7 Mild 8-19 Moderate 20-35 Severe     PMH: Past Medical History:  Diagnosis Date  . Carpal tunnel syndrome   . COPD with asthma ([place])   . Diabetes mellitus    Diet control   . DJD (degenerative joint disease), cervical   . Duodenal ulcer, with partial obstruction 08/10/2012  . Essential and other specified forms of tremor 12/16/2013  . Hyperlipidemia   . Hypertension    no medicine needed  . IBS (irritable bowel syndrome)   . Internal hemorrhoids   . Memory deficit  12/16/2013  . Numbness and tingling in right hand    started 2 yeas ago  . Peptic ulcer   . Personal history of colonic polyps    adenomatous  . Polyneuropathy in diabetes(357.2)   . Tremor, essential   . Vitamin D deficiency     Surgical History: Past Surgical History:  Procedure Laterality Date  . ANAL FISSURE REPAIR    . ANKLE SURGERY Right    right- pins placed in  . COLONOSCOPY W/ BIOPSIES AND POLYPECTOMY  8/03, 6/05, 7/09, 9/10   internal hemorrhoids, tubular adenomas, mucosa & lymphoid nodules  . TOTAL HIP ARTHROPLASTY Right 11/13/2012   Procedure: TOTAL HIP ARTHROPLASTY ANTERIOR APPROACH;  Surgeon: Mcarthur Rossetti, MD;  Location: [facility];  Service: Orthopedics;  Laterality: Right;  Right total hip arthroplasty  . UPPER GASTROINTESTINAL ENDOSCOPY  3/05, 7/09, 9/10,2013   gastritis, duodenitis    Home Medications:    Medication List       Accurate as of 05/11/16 11:09 AM. Always use your most  recent med list.          acetaminophen 500 MG tablet Commonly known as:  TYLENOL Take 500 mg by mouth every 6 (six) hours as needed.   BREO ELLIPTA 100-25 MCG/INH Aepb Generic drug:  fluticasone furoate-vilanterol Inhale 1 puff into the lungs daily.   feeding supplement (ENSURE ENLIVE) Liqd Take 237 mLs by mouth 2 (two) times daily between meals.   finasteride 5 MG tablet Commonly known as:  PROSCAR Take 1 tablet (5 mg total) by mouth daily.   furosemide 20 MG tablet Commonly known as:  LASIX Take 1 tablet (20 mg total) by mouth daily.   levothyroxine 50 MCG tablet Commonly known as:  SYNTHROID, LEVOTHROID Take 50 mcg by mouth daily before breakfast.   losartan 50 MG tablet Commonly known as:  COZAAR Take 1 tablet (50 mg total) by mouth daily.   pantoprazole 40 MG tablet Commonly known as:  PROTONIX Take 1 tablet (40 mg total) by mouth daily.   saccharomyces boulardii 250 MG capsule Commonly known as:  FLORASTOR Take 250 mg by mouth as needed.   STOOL SOFTENER PO Take by mouth. Reported on 11/11/2015   tamsulosin 0.4 MG Caps capsule Commonly known as:  FLOMAX TAKE ONE CAPSULE BY MOUTH ONCE A DAY.   zolpidem 5 MG tablet Commonly known as:  AMBIEN Take 5 mg by mouth at bedtime as needed for sleep. Reported on 11/11/2015       Allergies:  Allergies  Allergen Reactions  . Fexofenadine Other (See Comments)  . Quinapril Hcl Other (See Comments)    Unknown   . Latex Rash    Family History: Family History  Problem Relation Age of Onset  . Thyroid cancer Daughter 73  . Diabetes Son   . Urolithiasis Son   . Leukemia Maternal Aunt   . Colon cancer Neg Hx   . Esophageal cancer Neg Hx   . Rectal cancer Neg Hx   . Stomach cancer Neg Hx   . Prostate cancer Neg Hx   . Kidney disease Neg Hx   . Kidney cancer Neg Hx     Social History:  reports that he quit smoking about 52 years ago. His smoking use included Cigarettes. He has never used smokeless tobacco.  He reports that he does not drink alcohol or use drugs.  ROS: UROLOGY Frequent Urination?: Yes Hard to postpone urination?: No Burning/pain with urination?: No Get up at night to urinate?: Yes  Leakage of urine?: No Urine stream starts and stops?: No Trouble starting stream?: No Do you have to strain to urinate?: No Blood in urine?: No Urinary tract infection?: No Sexually transmitted disease?: No Injury to kidneys or bladder?: No Painful intercourse?: No Weak stream?: No Erection problems?: No Penile pain?: No  Gastrointestinal Nausea?: No Vomiting?: No Indigestion/heartburn?: No Diarrhea?: No Constipation?: No  Constitutional Fever: No Night sweats?: No Weight loss?: No Fatigue?: No  Skin Skin rash/lesions?: No Itching?: No  Eyes Blurred vision?: No Double vision?: No  Ears/Nose/Throat Sore throat?: No Sinus problems?: No  Hematologic/Lymphatic Swollen glands?: No Easy bruising?: No  Cardiovascular Leg swelling?: No Chest pain?: No  Respiratory Cough?: No Shortness of breath?: No  Endocrine Excessive thirst?: No  Musculoskeletal Back pain?: No Joint pain?: No  Neurological Headaches?: No Dizziness?: No  Psychologic Depression?: No Anxiety?: No  Physical Exam: BP 125/71   Pulse 93   Ht '5\' 3"'$  (1.6 m)   Wt 144 lb 3.2 oz (65.4 kg)   BMI 25.54 kg/m   Constitutional: Well nourished. Alert and oriented, No acute distress. HEENT: [place] AT, moist mucus membranes. Trachea midline, no masses. Cardiovascular: No clubbing, cyanosis, or edema. Respiratory: Normal respiratory effort, no increased work of breathing. Skin: No rashes, bruises or suspicious lesions. Lymph: No cervical or inguinal adenopathy. Neurologic: Grossly intact, no focal deficits, moving all 4 extremities. Psychiatric: Normal mood and affect.   Laboratory Data: Lab Results  Component Value Date   WBC 7.6 04/21/2016   HGB 13.2 04/21/2016   HCT 40.8 04/21/2016   MCV  96.0 04/21/2016   PLT 284.0 04/21/2016   Lab Results  Component Value Date   CREATININE 1.02 04/21/2016   Lab Results  Component Value Date   PSA 0.83 02/06/2015   Lab Results  Component Value Date   HGBA1C 5.8 04/21/2016    Results for orders placed or performed in visit on 04/21/16  CBC with Differential/Platelet  Result Value Ref Range   WBC 7.6 4.0 - 10.5 K/uL   RBC 4.25 4.22 - 5.81 Mil/uL   Hemoglobin 13.2 13.0 - 17.0 g/dL   HCT 40.8 39.0 - 52.0 %   MCV 96.0 78.0 - 100.0 fl   MCHC 32.3 30.0 - 36.0 g/dL   RDW 13.4 11.5 - 15.5 %   Platelets 284.0 150.0 - 400.0 K/uL   Neutrophils Relative % 79.5 (H) 43.0 - 77.0 %   Lymphocytes Relative 11.5 (L) 12.0 - 46.0 %   Monocytes Relative 6.5 3.0 - 12.0 %   Eosinophils Relative 2.2 0.0 - 5.0 %   Basophils Relative 0.3 0.0 - 3.0 %   Neutro Abs 6.0 1.4 - 7.7 K/uL   Lymphs Abs 0.9 0.7 - 4.0 K/uL   Monocytes Absolute 0.5 0.1 - 1.0 K/uL   Eosinophils Absolute 0.2 0.0 - 0.7 K/uL   Basophils Absolute 0.0 0.0 - 0.1 K/uL  Comprehensive metabolic panel  Result Value Ref Range   Sodium 136 135 - 145 mEq/L   Potassium 4.2 3.5 - 5.1 mEq/L   Chloride 92 (L) 96 - 112 mEq/L   CO2 38 (H) 19 - 32 mEq/L   Glucose, Bld 100 (H) 70 - 99 mg/dL   BUN 24 (H) 6 - 23 mg/dL   Creatinine, Ser 1.02 0.40 - 1.50 mg/dL   Total Bilirubin 0.4 0.2 - 1.2 mg/dL   Alkaline Phosphatase 58 39 - 117 U/L   AST 18 0 - 37 U/L   ALT 9 0 - 53  U/L   Total Protein 7.4 6.0 - 8.3 g/dL   Albumin 4.3 3.5 - 5.2 g/dL   Calcium 9.8 8.4 - 10.5 mg/dL   GFR 89.56 >60.00 mL/min  TSH  Result Value Ref Range   TSH 1.38 0.35 - 4.50 uIU/mL  Hemoglobin A1c  Result Value Ref Range   Hgb A1c MFr Bld 5.8 4.6 - 6.5 %    Assessment & Plan:    1. Nocturia:   Patient has found relief with his CPAP machine.  He is still sleeping with CPAP machine.  He is now having nocturia 2 which is reduction from nocturia 4. He'll return in 6 months for I PSS.  2. BPH with LUTS  - IPSS score  is 12/1, it is stable  - Continue conservative management, avoiding bladder irritants and timed voiding's  - Continue finasteride 5 mg daily  - Discontinue the tamsulosin 0.4 mg, restart if symptoms worsen   - RTC in 6 months for IPSS and exam     Return in about 6 months (around 11/11/2016) for IPSS and exam.  Zara Council, [facility]  [facility] [address], [place], [place] [address] [phone]

## 2016-05-18 ENCOUNTER — Telehealth: Payer: Self-pay | Admitting: Pulmonary Disease

## 2016-05-18 NOTE — Telephone Encounter (Signed)
Pt spouse calling stating pt oxygen level is running a bit low.    *STAT* If patient is at the pharmacy, call can be transferred to refill team.   1. Which medications need to be refilled? (please list name of each medication and dose if known)  Spriva inhaler  2. Which pharmacy/location (including street and city if local pharmacy) is medication to be sent to? [facility]  3. Do they need a 30 day or 90 day supply?  90 day

## 2016-05-18 NOTE — Telephone Encounter (Signed)
Spoke with wife and she is concerned that pt also needed to be on Spiriva. Per DS last OV note pt is to only do Breo only. Wife states they do not need refills on breo.

## 2016-06-09 ENCOUNTER — Encounter: Payer: Self-pay | Admitting: Podiatry

## 2016-06-09 ENCOUNTER — Ambulatory Visit (INDEPENDENT_AMBULATORY_CARE_PROVIDER_SITE_OTHER): Payer: Medicare Other | Admitting: Podiatry

## 2016-06-09 DIAGNOSIS — M79676 Pain in unspecified toe(s): Secondary | ICD-10-CM

## 2016-06-09 DIAGNOSIS — B351 Tinea unguium: Secondary | ICD-10-CM | POA: Diagnosis not present

## 2016-06-09 NOTE — Progress Notes (Signed)
[name], male   DOB: [birth year], 82 y.o.   MRN: [id number]  Subjective: 82 y.o. returns the office today for painful, elongated, thickened toenails which he is unable to trim himself. Denies any redness or drainage around the nails. Denies any acute changes since last appointment and no new complaints today. Denies any systemic complaints such as fevers, chills, nausea, vomiting.   Objective: AAO 3, NAD DP/PT pulses palpable 1/4, CRT less than 3 seconds Moderate chronic dema to bilateral ankles without any associated redness or warmth.  Nails hypertrophic, dystrophic, elongated, brittle, discolored 10. There is tenderness overlying the nails 1-5 bilaterally. There is no surrounding erythema or drainage along the nail sites. No open lesions or pre-ulcerative lesions are identified. No other areas of tenderness bilateral lower extremities. No overlying edema, erythema, increased warmth. No pain with calf compression, swelling, warmth, erythema.  Assessment: Patient presents with symptomatic onychomycosis  Plan: -Treatment options including alternatives, risks, complications were discussed -Nails sharply debrided 10 without complication/bleeding. -Discussed daily foot inspection. If there are any changes, to call the office immediately.  -Follow-up in 3 months or sooner if any problems are to arise. In the meantime, encouraged to call the office with any questions, concerns, changes symptoms.  Celesta Gentile, DPM

## 2016-07-04 ENCOUNTER — Encounter: Payer: Self-pay | Admitting: Pulmonary Disease

## 2016-07-04 ENCOUNTER — Ambulatory Visit (INDEPENDENT_AMBULATORY_CARE_PROVIDER_SITE_OTHER): Payer: Medicare Other | Admitting: Pulmonary Disease

## 2016-07-04 VITALS — BP 118/60 | HR 99 | Ht 63.0 in | Wt 149.0 lb

## 2016-07-04 DIAGNOSIS — J310 Chronic rhinitis: Secondary | ICD-10-CM

## 2016-07-04 DIAGNOSIS — J449 Chronic obstructive pulmonary disease, unspecified: Secondary | ICD-10-CM | POA: Diagnosis not present

## 2016-07-04 MED ORDER — ALBUTEROL SULFATE HFA 108 (90 BASE) MCG/ACT IN AERS: 1.0000 | INHALATION_SPRAY | Freq: Four times a day (QID) | RESPIRATORY_TRACT | 6 refills | Status: DC | PRN

## 2016-07-04 NOTE — Progress Notes (Signed)
PROFILE: Hospitalized 9/29 - 07/06/15 initially with AMS and hyponatremia. Developed progressive hypersomnolence and was found to be profoundly hypercarbic (PaCO2 162 torr!!). Underwent intubation and mechanical ventilation 9/30 - 07/01/15. PCCM was involved in his care during his time in the ICU. It was felt that the hypercarbic resp failure was due to COPD and he was discharged home on bronchodilator therapy with pulmonary follow up. On his initial CXR, there was concern for a RUL nodule which was confirmed by CT chest. Again hospitalized 10/20-10/26/16 for weakness and hyponatremia. Seen in consultation by Dr Stevenson Clinch CXR (07/02/15): NACPD CT chest (07/20/15): RUL "nodule" smaller than previously noted in Sept PET 07/22/15: 1. No specific findings identified to suggest hypermetabolic/FDG avid tumor. Continued resolution of right upper lobe pulmonary nodule which currently measures 1.3 cm and exhibits non malignant range FDG uptake Spirometry 07/30/15: FVC  2.09 73%      FEV1  1.47 68%    FEV1% 70%   PSG 07/27/15: Moderate OSA (AHI 29) LE venous US 08/02/15: no DVT  PROBLEMS: COPD - mild obstruction OSA, moderate. Treated with CPAP - tolerating  INTERVAL HISTORY: No major events since last visit in June 2017  SUBJ: No new complaints. No new respiratory symptoms. Compliant with CPAP. Continues on [place]. Rarely uses rescue inhaler. Denies CP, fever, purulent sputum, hemoptysis, LE edema and calf tenderness.   OBJ: Vitals:   07/04/16 1105  BP: 118/60  Pulse: 99  SpO2: 96%  Weight: 149 lb (67.6 kg)  Height: '5\' 3"'$  (1.6 m)   NAD HEENT WNL No JVD noted BS diminished without wheezing Reg, no M noted Abd soft, NT, +BS Ext 2+ BLE pitting edema Neuro: diffusely weak, no focal deficits  Outpatient Encounter Prescriptions as of 07/04/2016  Medication Sig  . acetaminophen (TYLENOL) 500 MG tablet Take 500 mg by mouth every 6 (six) hours as needed.  Mariane Baumgarten Calcium (STOOL SOFTENER PO) Take  by mouth. Reported on 11/11/2015  . feeding supplement, ENSURE ENLIVE, (ENSURE ENLIVE) LIQD Take 237 mLs by mouth 2 (two) times daily between meals.  . finasteride (PROSCAR) 5 MG tablet Take 1 tablet (5 mg total) by mouth daily.  . Fluticasone Furoate-Vilanterol (BREO ELLIPTA) 100-25 MCG/INH AEPB Inhale 1 puff into the lungs daily.  . furosemide (LASIX) 20 MG tablet Take 1 tablet (20 mg total) by mouth daily. (Patient taking differently: Take 40 mg by mouth daily. )  . levothyroxine (SYNTHROID, LEVOTHROID) 50 MCG tablet Take 50 mcg by mouth daily before breakfast.  . losartan (COZAAR) 50 MG tablet Take 1 tablet (50 mg total) by mouth daily.  . pantoprazole (PROTONIX) 40 MG tablet Take 1 tablet (40 mg total) by mouth daily.  Marland Kitchen saccharomyces boulardii (FLORASTOR) 250 MG capsule Take 250 mg by mouth as needed.   . tamsulosin (FLOMAX) 0.4 MG CAPS capsule TAKE ONE CAPSULE BY MOUTH ONCE A DAY.  Marland Kitchen albuterol (PROVENTIL HFA;VENTOLIN HFA) 108 (90 Base) MCG/ACT inhaler Inhale 1-2 puffs into the lungs every 6 (six) hours as needed for wheezing or shortness of breath.  . [DISCONTINUED] zolpidem (AMBIEN) 5 MG tablet Take 5 mg by mouth at bedtime as needed for sleep. Reported on 11/11/2015   No facility-administered encounter medications on file as of 07/04/2016.    DATA: BMP Latest Ref Rng & Units 04/21/2016 07/30/2015 07/22/2015  Glucose 70 - 99 mg/dL 100(H) 124(H) 107(H)  BUN 6 - 23 mg/dL 24(H) 16 17  Creatinine 0.40 - 1.50 mg/dL 1.02 0.91 0.71  Sodium 135 - 145 mEq/L  136 129(L) 131(L)  Potassium 3.5 - 5.1 mEq/L 4.2 4.0 4.4  Chloride 96 - 112 mEq/L 92(L) 91(L) 91(L)  CO2 19 - 32 mEq/L 38(H) 33(H) 37(H)  Calcium 8.4 - 10.5 mg/dL 9.8 8.8(L) 8.5(L)    CBC Latest Ref Rng & Units 04/21/2016 07/22/2015 07/16/2015  WBC 4.0 - 10.5 K/uL 7.6 5.7 6.3  Hemoglobin 13.0 - 17.0 g/dL 13.2 11.0(L) 13.7  Hematocrit 39.0 - 52.0 % 40.8 34.2(L) 41.2  Platelets 150.0 - 400.0 K/uL 284.0 271 304    IMPRESSION/PLAN: 1)  Chronic hypercarbic respiratory failure. Much improved since first detected 06/2015. 2) Mild COPD. No evidence of bronchospasm currently.  - Cont Breo 100/25. One inhalation daily  - Cont albuterol nebulizer PRN  - Albuterol MDI PRN added  3) Moderate OSA, well controlled on CPAP.   ROV 6 months  Merton Border, MD PCCM service Mobile [phone] Pager [phone] 07/04/2016

## 2016-07-04 NOTE — Patient Instructions (Addendum)
Continue current medications Albuterol inhaler as needed for shortness of breath Follow up in 6 months or as needed

## 2016-07-07 ENCOUNTER — Other Ambulatory Visit: Payer: Self-pay

## 2016-07-07 MED ORDER — ALBUTEROL SULFATE HFA 108 (90 BASE) MCG/ACT IN AERS: 1.0000 | INHALATION_SPRAY | Freq: Four times a day (QID) | RESPIRATORY_TRACT | 3 refills | Status: DC | PRN

## 2016-07-23 ENCOUNTER — Other Ambulatory Visit: Payer: Self-pay | Admitting: Internal Medicine

## 2016-08-12 ENCOUNTER — Other Ambulatory Visit: Payer: Self-pay | Admitting: *Deleted

## 2016-08-12 MED ORDER — FLUTICASONE FUROATE-VILANTEROL 100-25 MCG/INH IN AEPB: 1.0000 | INHALATION_SPRAY | Freq: Every day | RESPIRATORY_TRACT | 3 refills | Status: DC

## 2016-08-19 ENCOUNTER — Other Ambulatory Visit: Payer: Self-pay | Admitting: Urology

## 2016-08-19 DIAGNOSIS — N138 Other obstructive and reflux uropathy: Secondary | ICD-10-CM

## 2016-08-19 DIAGNOSIS — N401 Enlarged prostate with lower urinary tract symptoms: Principal | ICD-10-CM

## 2016-08-23 ENCOUNTER — Encounter: Payer: Self-pay | Admitting: Internal Medicine

## 2016-08-23 ENCOUNTER — Ambulatory Visit (INDEPENDENT_AMBULATORY_CARE_PROVIDER_SITE_OTHER): Payer: Medicare Other | Admitting: Internal Medicine

## 2016-08-23 VITALS — BP 130/68 | HR 73 | Temp 98.0°F | Ht 63.0 in | Wt 147.6 lb

## 2016-08-23 DIAGNOSIS — E039 Hypothyroidism, unspecified: Secondary | ICD-10-CM | POA: Diagnosis not present

## 2016-08-23 DIAGNOSIS — L853 Xerosis cutis: Secondary | ICD-10-CM

## 2016-08-23 DIAGNOSIS — N401 Enlarged prostate with lower urinary tract symptoms: Secondary | ICD-10-CM

## 2016-08-23 DIAGNOSIS — R600 Localized edema: Secondary | ICD-10-CM | POA: Diagnosis not present

## 2016-08-23 DIAGNOSIS — J441 Chronic obstructive pulmonary disease with (acute) exacerbation: Secondary | ICD-10-CM

## 2016-08-23 DIAGNOSIS — N138 Other obstructive and reflux uropathy: Secondary | ICD-10-CM

## 2016-08-23 DIAGNOSIS — I10 Essential (primary) hypertension: Secondary | ICD-10-CM

## 2016-08-23 DIAGNOSIS — E871 Hypo-osmolality and hyponatremia: Secondary | ICD-10-CM

## 2016-08-23 DIAGNOSIS — E119 Type 2 diabetes mellitus without complications: Secondary | ICD-10-CM

## 2016-08-23 NOTE — Progress Notes (Signed)
[name], male   DOB: 27-May-1930, 82 y.o.   MRN: [id number]   Subjective:    [name], male    DOB: 1930-07-01, 82 y.o.   MRN: [id number]  HPI  Patient here for a scheduled follow up.  He is accompanied by his wife.  History obtained from both of them.  He is doing relatively well.  Discussed any concerns.  Questioned him about abdominal pain.  He denied.  He states that sometimes if he eats late, he may notice a little discomfort, but is not concerned.  No nausea or vomiting.  Bowels moving.  Breathing stable.  Sees Dr Alva Garnet.  Using Breo.  Also recommended CPAP.  Saw Dr Phillip Heal.  Using eucerin and triamcinolone cream.  Still some dryness.  Discussed using eucerin bid.  F/u with dermatology if persistent.  Overall he feels he is doing relatively well.     Past Medical History:  Diagnosis Date  . Carpal tunnel syndrome   . COPD with asthma ([place])   . Diabetes mellitus    Diet control   . DJD (degenerative joint disease), cervical   . Duodenal ulcer, with partial obstruction 08/10/2012  . Essential and other specified forms of tremor 12/16/2013  . Hyperlipidemia   . Hypertension    no medicine needed  . IBS (irritable bowel syndrome)   . Internal hemorrhoids   . Memory deficit 12/16/2013  . Numbness and tingling in right hand    started 2 yeas ago  . Peptic ulcer   . Personal history of colonic polyps    adenomatous  . Polyneuropathy in diabetes(357.2)   . Tremor, essential   . Vitamin D deficiency    Past Surgical History:  Procedure Laterality Date  . ANAL FISSURE REPAIR    . ANKLE SURGERY Right    right- pins placed in  . COLONOSCOPY W/ BIOPSIES AND POLYPECTOMY  8/03, 6/05, 7/09, 9/10   internal hemorrhoids, tubular adenomas, mucosa & lymphoid nodules  . TOTAL HIP ARTHROPLASTY Right 11/13/2012   Procedure: TOTAL HIP ARTHROPLASTY ANTERIOR APPROACH;  Surgeon: Mcarthur Rossetti, MD;  Location: [facility];  Service: Orthopedics;   Laterality: Right;  Right total hip arthroplasty  . UPPER GASTROINTESTINAL ENDOSCOPY  3/05, 7/09, 9/10,2013   gastritis, duodenitis   Family History  Problem Relation Age of Onset  . Thyroid cancer Daughter 68  . Diabetes Son   . Urolithiasis Son   . Leukemia Maternal Aunt   . Colon cancer Neg Hx   . Esophageal cancer Neg Hx   . Rectal cancer Neg Hx   . Stomach cancer Neg Hx   . Prostate cancer Neg Hx   . Kidney disease Neg Hx   . Kidney cancer Neg Hx    Social History   Social History  . Marital status: Married    Spouse name: N/A  . Number of children: 3  . Years of education: MA   Occupational History  . 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 Smoking status: Former Smoker    Types: Cigarettes    Quit date: 07/02/1963  . Smokeless tobacco: Never Used  . Alcohol use No  . Drug use: No  . Sexual activity: Not Asked   Other Topics Concern  . None   Social History Narrative   Veteran [place] [employer]    Outpatient Encounter Prescriptions as of 08/23/2016  Medication Sig  . acetaminophen (TYLENOL) 500  MG tablet Take 500 mg by mouth every 6 (six) hours as needed.  Marland Kitchen albuterol (PROAIR HFA) 108 (90 Base) MCG/ACT inhaler Inhale 1-2 puffs into the lungs every 6 (six) hours as needed for wheezing or shortness of breath.  Mariane Baumgarten Calcium (STOOL SOFTENER PO) Take by mouth. Reported on 11/11/2015  . feeding supplement, ENSURE ENLIVE, (ENSURE ENLIVE) LIQD Take 237 mLs by mouth 2 (two) times daily between meals.  . finasteride (PROSCAR) 5 MG tablet TAKE 1 TABLET BY MOUTH ONCE DAILY.  . fluticasone furoate-vilanterol (BREO ELLIPTA) 100-25 MCG/INH AEPB Inhale 1 puff into the lungs daily.  . furosemide (LASIX) 20 MG tablet Take 1 tablet (20 mg total) by mouth daily. (Patient taking differently: Take 40 mg by mouth daily. )  . levothyroxine (SYNTHROID, LEVOTHROID) 50 MCG tablet Take 50 mcg by mouth daily before breakfast.  . losartan (COZAAR) 50 MG tablet TAKE  1 TABLET BY MOUTH ONCE DAILY.  . pantoprazole (PROTONIX) 40 MG tablet Take 1 tablet (40 mg total) by mouth daily.  Marland Kitchen saccharomyces boulardii (FLORASTOR) 250 MG capsule Take 250 mg by mouth as needed.   . tamsulosin (FLOMAX) 0.4 MG CAPS capsule TAKE ONE CAPSULE BY MOUTH ONCE A DAY.   No facility-administered encounter medications on file as of 08/23/2016.     Review of Systems  Constitutional: Negative for appetite change and unexpected weight change.  HENT: Negative for congestion and sinus pressure.   Respiratory: Negative for cough, chest tightness and shortness of breath.   Cardiovascular: Negative for chest pain, palpitations and leg swelling.  Gastrointestinal: Negative for abdominal pain, diarrhea, nausea and vomiting.  Genitourinary: Negative for difficulty urinating and dysuria.  Musculoskeletal: Negative for back pain and joint swelling.  Skin: Negative for color change and rash.       Some dry skin.   Neurological: Negative for dizziness, light-headedness and headaches.  Psychiatric/Behavioral: Negative for agitation and dysphoric mood.       Objective:    Physical Exam  Constitutional: He appears well-developed and well-nourished. No distress.  HENT:  Nose: Nose normal.  Mouth/Throat: Oropharynx is clear and moist.  Neck: Neck supple. No thyromegaly present.  Cardiovascular: Normal rate and regular rhythm.   Pulmonary/Chest: Effort normal and breath sounds normal. No respiratory distress.  Abdominal: Soft. Bowel sounds are normal. There is no tenderness.  Musculoskeletal: He exhibits no edema or tenderness.  Lymphadenopathy:    He has no cervical adenopathy.  Skin: No rash noted. No erythema.  Psychiatric: He has a normal mood and affect. His behavior is normal.    BP 130/68   Pulse 73   Temp 98 F (36.7 C) (Oral)   Ht 5' 3"  (1.6 m)   Wt 147 lb 9.6 oz (67 kg)   SpO2 98%   BMI 26.15 kg/m  Wt Readings from Last 3 Encounters:  08/23/16 147 lb 9.6 oz (67 kg)    07/04/16 149 lb (67.6 kg)  05/11/16 144 lb 3.2 oz (65.4 kg)     Lab Results  Component Value Date   WBC 7.6 04/21/2016   HGB 13.2 04/21/2016   HCT 40.8 04/21/2016   PLT 284.0 04/21/2016   GLUCOSE 100 (H) 04/21/2016   CHOL 223 (H) 06/24/2015   TRIG 53.0 06/24/2015   HDL 96.10 06/24/2015   LDLCALC 116 (H) 06/24/2015   ALT 9 04/21/2016   AST 18 04/21/2016   NA 136 04/21/2016   K 4.2 04/21/2016   CL 92 (L) 04/21/2016   CREATININE 1.02 04/21/2016  BUN 24 (H) 04/21/2016   CO2 38 (H) 04/21/2016   TSH 1.38 04/21/2016   PSA 0.83 02/06/2015   INR 0.94 11/06/2012   HGBA1C 5.8 04/21/2016   MICROALBUR 93.8 (H) 06/24/2015       Assessment & Plan:   Problem List Items Addressed This Visit    BPH with obstruction/lower urinary tract symptoms    Followed by urology.       COPD (chronic obstructive pulmonary disease) ([place])    Followed by Dr Alva Garnet.  Stable on current regimen.  Follow.        Diabetes mellitus, type II ([place])    Sugars have been doing well.  Follow met b and a1c.        Hypertension    Blood pressure under good control.  Continue same medication regimen.  Follow pressures.  Follow metabolic panel.        Hyponatremia    Followed by nephrology.  Sodium has been doing well.  Follow.        Hypothyroidism    On thyroid replacement.  Follow tsh.        Lower extremity edema    No significant swelling today.  Compression hose.  Follow.         Other Visit Diagnoses    Dry skin    -  Primary   seeing dermatology.  eucerin and triamcinolone cream.  increase eucerin to bid.  continue TCC.  follow up with dermatology if persistent.         [name], MD

## 2016-08-23 NOTE — Progress Notes (Signed)
Pre visit review using our clinic review tool, if applicable. No additional management support is needed unless otherwise documented below in the visit note. 

## 2016-08-28 ENCOUNTER — Encounter: Payer: Self-pay | Admitting: Internal Medicine

## 2016-08-28 NOTE — Assessment & Plan Note (Signed)
Followed by nephrology.  Sodium has been doing well.  Follow.

## 2016-08-28 NOTE — Assessment & Plan Note (Signed)
Followed by urology.   

## 2016-08-28 NOTE — Assessment & Plan Note (Signed)
On thyroid replacement.  Follow tsh.  

## 2016-08-28 NOTE — Assessment & Plan Note (Signed)
Blood pressure under good control.  Continue same medication regimen.  Follow pressures.  Follow metabolic panel.   

## 2016-08-28 NOTE — Assessment & Plan Note (Signed)
Followed by Dr Alva Garnet.  Stable on current regimen.  Follow.

## 2016-08-28 NOTE — Assessment & Plan Note (Signed)
No significant swelling today.  Compression hose.  Follow.

## 2016-08-28 NOTE — Assessment & Plan Note (Signed)
Sugars have been doing well.  Follow met b and a1c.   

## 2016-08-30 ENCOUNTER — Telehealth: Payer: Self-pay | Admitting: Pulmonary Disease

## 2016-08-30 NOTE — Telephone Encounter (Signed)
Informed wife to contact [facility] to have the CPAP checked. She states she just heard from them and that she will take it to them to get checked. Nothing further needed.

## 2016-08-30 NOTE — Telephone Encounter (Signed)
Pt wife calling stating that pt his cpap machine is leaking and would like to know how about to get this fixed. Needs the pressure checked.  Please advise.

## 2016-09-09 ENCOUNTER — Ambulatory Visit (INDEPENDENT_AMBULATORY_CARE_PROVIDER_SITE_OTHER): Payer: Medicare Other | Admitting: Podiatry

## 2016-09-09 ENCOUNTER — Encounter: Payer: Self-pay | Admitting: Podiatry

## 2016-09-09 ENCOUNTER — Ambulatory Visit: Payer: Medicare Other | Admitting: Podiatry

## 2016-09-09 DIAGNOSIS — M79609 Pain in unspecified limb: Secondary | ICD-10-CM

## 2016-09-09 DIAGNOSIS — L603 Nail dystrophy: Secondary | ICD-10-CM

## 2016-09-09 DIAGNOSIS — B351 Tinea unguium: Secondary | ICD-10-CM | POA: Diagnosis not present

## 2016-09-09 DIAGNOSIS — L608 Other nail disorders: Secondary | ICD-10-CM

## 2016-09-11 NOTE — Progress Notes (Signed)
SUBJECTIVE Patient  presents to office today complaining of elongated, thickened nails. Pain while ambulating in shoes. Patient is unable to trim their own nails.   OBJECTIVE General Patient is awake, alert, and oriented x 3 and in no acute distress. Derm Skin is dry and supple bilateral. Negative open lesions or macerations. Remaining integument unremarkable. Nails are tender, long, thickened and dystrophic with subungual debris, consistent with onychomycosis, 1-5 bilateral. No signs of infection noted. Vasc  DP and PT pedal pulses palpable bilaterally. Temperature gradient within normal limits.  Neuro Epicritic and protective threshold sensation diminished bilaterally.  Musculoskeletal Exam No symptomatic pedal deformities noted bilateral. Muscular strength within normal limits.  ASSESSMENT 1. Onychodystrophic nails 1-5 bilateral with hyperkeratosis of nails.  2. Onychomycosis of nail due to dermatophyte bilateral 3. Pain in foot bilateral  PLAN OF CARE 1. Patient evaluated today.  2. Instructed to maintain good pedal hygiene and foot care.  3. Mechanical debridement of nails 1-5 bilaterally performed using a nail nipper. Filed with dremel without incident.  4. Return to clinic in 3 mos.    Edrick Kins, DPM

## 2016-09-13 ENCOUNTER — Ambulatory Visit: Payer: Medicare Other | Admitting: Podiatry

## 2016-09-22 ENCOUNTER — Other Ambulatory Visit: Payer: Self-pay | Admitting: Internal Medicine

## 2016-10-10 ENCOUNTER — Telehealth: Payer: Self-pay | Admitting: Internal Medicine

## 2016-10-10 NOTE — Telephone Encounter (Signed)
Pt spouse called and stated that pt is c/o frequent urination, stomach pain, and head and chest congestion, hoarse voice. Please advise, thank you!  Call pt @ [phone]

## 2016-10-10 NOTE — Telephone Encounter (Signed)
Reason for call:frequent urination, pain  upper part of stomach , chest congestion hoarse voice X this am Symptoms: urinary frequency , rates pain 7/10  Duration last night  Medications:Proair  Spoke with patient and  Herbert Pun wife she will have him evaluated at [facility] in Clinic take today and follow up with [place]

## 2016-11-11 ENCOUNTER — Ambulatory Visit: Payer: Medicare Other | Admitting: Urology

## 2016-11-14 ENCOUNTER — Ambulatory Visit (INDEPENDENT_AMBULATORY_CARE_PROVIDER_SITE_OTHER): Payer: Medicare Other | Admitting: Urology

## 2016-11-14 ENCOUNTER — Encounter: Payer: Self-pay | Admitting: Urology

## 2016-11-14 VITALS — BP 134/75 | HR 86 | Ht 63.0 in | Wt 147.0 lb

## 2016-11-14 DIAGNOSIS — R351 Nocturia: Secondary | ICD-10-CM

## 2016-11-14 DIAGNOSIS — N138 Other obstructive and reflux uropathy: Secondary | ICD-10-CM

## 2016-11-14 DIAGNOSIS — N401 Enlarged prostate with lower urinary tract symptoms: Secondary | ICD-10-CM

## 2016-11-14 DIAGNOSIS — N393 Stress incontinence (female) (male): Secondary | ICD-10-CM

## 2016-11-14 LAB — BLADDER SCAN AMB NON-IMAGING: SCAN RESULT: 119

## 2016-11-14 MED ORDER — TAMSULOSIN HCL 0.4 MG PO CAPS: 0.4000 mg | ORAL_CAPSULE | Freq: Every day | ORAL | 3 refills | Status: DC

## 2016-11-14 MED ORDER — FINASTERIDE 5 MG PO TABS: 5.0000 mg | ORAL_TABLET | Freq: Every day | ORAL | 3 refills | Status: DC

## 2016-11-14 NOTE — Progress Notes (Signed)
4:09 PM   Aaron Hendrix [birth year] [id number]  Referring provider: [name], MD [address] [place], [place] [address]  Chief Complaint  Patient presents with  . Benign Prostatic Hypertrophy    82month   HPI: Patient is an 83year old  African American male who presents today for a six months follow up after adding finasteride to his tamsulosin.   Previous history Patient was seen during a recent admission for hyponatremia with nocturia and was started on tamsulosin for his moderate PVR's of 200 cc.  He was experiencing urinary frequency, urgency, nocturia, leakage of urine, intermittency, hesitancy, straining to urinate and a weak stream.  He has been diagnosed with sleep apnea and is sleeping with his CPAP machine.  A median lobe of the prostate was appreciated on a CT scan in 05/2014.  BPH WITH LUTS His IPSS score today is 7, which is mild lower urinary tract symptomatology.  He is pleased with his quality life due to his urinary symptoms.  His PVR is His previous IPSS score was 12/1.   His previous PVR was 110 mL.  His complaint today is losing urine when he changes altitudes (going up and down stairs).  He denies any dysuria, hematuria or suprapubic pain.  He currently taking tamsulosin 0.4 mg and finasteride 5 mg daily.   He also denies any recent fevers, chills, nausea or vomiting.  He does not have a family history of PCa.  He has seen an improvement in his nocturia.  He is only getting up twice a night.  This is a reduction from four times a night.        IPSS    Row Name 11/14/16 1500         International Prostate Symptom Score   How often have you had the sensation of not emptying your bladder? Not at All     How often have you had to urinate less than every two hours? Less than half the time     How often have you found you stopped and started again several times when you urinated? Less than 1 in 5 times     How often have you found  it difficult to postpone urination? Less than 1 in 5 times     How often have you had a weak urinary stream? Less than 1 in 5 times     How often have you had to strain to start urination? Not at All     How many times did you typically get up at night to urinate? 2 Times     Total IPSS Score 7       Quality of Life due to urinary symptoms   If you were to spend the rest of your life with your urinary condition just the way it is now how would you feel about that? Pleased        Score:  1-7 Mild 8-19 Moderate 20-35 Severe     PMH: Past Medical History:  Diagnosis Date  . Carpal tunnel syndrome   . COPD with asthma ([place]   . Diabetes mellitus    Diet control   . DJD (degenerative joint disease), cervical   . Duodenal ulcer, with partial obstruction 08/10/2012  . Essential and other specified forms of tremor 12/16/2013  . Hyperlipidemia   . Hypertension    no medicine needed  . IBS (irritable bowel syndrome)   . Internal hemorrhoids   . Memory  deficit 12/16/2013  . Numbness and tingling in right hand    started 2 yeas ago  . Peptic ulcer   . Personal history of colonic polyps    adenomatous  . Polyneuropathy in diabetes(357.2)   . Tremor, essential   . Vitamin D deficiency     Surgical History: Past Surgical History:  Procedure Laterality Date  . ANAL FISSURE REPAIR    . ANKLE SURGERY Right    right- pins placed in  . COLONOSCOPY W/ BIOPSIES AND POLYPECTOMY  8/03, 6/05, 7/09, 9/10   internal hemorrhoids, tubular adenomas, mucosa & lymphoid nodules  . TOTAL HIP ARTHROPLASTY Right 11/13/2012   Procedure: TOTAL HIP ARTHROPLASTY ANTERIOR APPROACH;  Surgeon: Mcarthur Rossetti, MD;  Location: [facility];  Service: Orthopedics;  Laterality: Right;  Right total hip arthroplasty  . UPPER GASTROINTESTINAL ENDOSCOPY  3/05, 7/09, 9/10,2013   gastritis, duodenitis    Home Medications:  Allergies as of 11/14/2016      Reactions   Fexofenadine Other (See Comments)    Quinapril Hcl Other (See Comments)   Unknown    Latex Rash      Medication List       Accurate as of 11/14/16  4:09 PM. Always use your most recent med list.          albuterol 108 (90 Base) MCG/ACT inhaler Commonly known as:  PROAIR HFA Inhale 1-2 puffs into the lungs every 6 (six) hours as needed for wheezing or shortness of breath.   finasteride 5 MG tablet Commonly known as:  PROSCAR Take 1 tablet (5 mg total) by mouth daily.   fluticasone furoate-vilanterol 100-25 MCG/INH Aepb Commonly known as:  BREO ELLIPTA Inhale 1 puff into the lungs daily.   furosemide 20 MG tablet Commonly known as:  LASIX Take 1 tablet (20 mg total) by mouth daily.   levothyroxine 50 MCG tablet Commonly known as:  SYNTHROID, LEVOTHROID Take 50 mcg by mouth daily before breakfast.   losartan 50 MG tablet Commonly known as:  COZAAR TAKE 1 TABLET BY MOUTH ONCE DAILY.   pantoprazole 40 MG tablet Commonly known as:  PROTONIX Take 1 tablet (40 mg total) by mouth daily.   saccharomyces boulardii 250 MG capsule Commonly known as:  FLORASTOR Take 250 mg by mouth as needed.   tamsulosin 0.4 MG Caps capsule Commonly known as:  FLOMAX Take 1 capsule (0.4 mg total) by mouth daily.       Allergies:  Allergies  Allergen Reactions  . Fexofenadine Other (See Comments)  . Quinapril Hcl Other (See Comments)    Unknown   . Latex Rash    Family History: Family History  Problem Relation Age of Onset  . Thyroid cancer Daughter 2  . Diabetes Son   . Urolithiasis Son   . Leukemia Maternal Aunt   . Colon cancer Neg Hx   . Esophageal cancer Neg Hx   . Rectal cancer Neg Hx   . Stomach cancer Neg Hx   . Prostate cancer Neg Hx   . Kidney disease Neg Hx   . Kidney cancer Neg Hx     Social History:  reports that he quit smoking about 53 years ago. His smoking use included Cigarettes. He has never used smokeless tobacco. He reports that he does not drink alcohol or use  drugs.  ROS: UROLOGY Frequent Urination?: No Hard to postpone urination?: No Burning/pain with urination?: No Get up at night to urinate?: No Leakage of urine?: No Urine stream starts and  stops?: No Trouble starting stream?: No Do you have to strain to urinate?: No Blood in urine?: No Urinary tract infection?: No Sexually transmitted disease?: No Injury to kidneys or bladder?: No Painful intercourse?: No Weak stream?: No Erection problems?: No Penile pain?: No  Gastrointestinal Nausea?: No Vomiting?: No Indigestion/heartburn?: No Diarrhea?: No Constipation?: No  Constitutional Fever: No Night sweats?: No Weight loss?: No Fatigue?: No  Skin Skin rash/lesions?: No Itching?: No  Eyes Blurred vision?: No Double vision?: No  Ears/Nose/Throat Sore throat?: No Sinus problems?: No  Hematologic/Lymphatic Swollen glands?: No Easy bruising?: No  Cardiovascular Leg swelling?: No Chest pain?: No  Respiratory Cough?: No Shortness of breath?: No  Endocrine Excessive thirst?: No  Musculoskeletal Back pain?: No Joint pain?: No  Neurological Headaches?: No Dizziness?: No  Psychologic Depression?: No Anxiety?: No  Physical Exam: BP 134/75   Pulse 86   Ht '5\' 3"'$  (1.6 m)   Wt 147 lb (66.7 kg)   BMI 26.04 kg/m   Constitutional: Well nourished. Alert and oriented, No acute distress. HEENT: [place] AT, moist mucus membranes. Trachea midline, no masses. Cardiovascular: No clubbing, cyanosis, or edema. Respiratory: Normal respiratory effort, no increased work of breathing. GI: Abdomen is soft, non tender, non distended, no abdominal masses. Liver and spleen not palpable.  No hernias appreciated.  Stool sample for occult testing is not indicated.   GU: No CVA tenderness.  No bladder fullness or masses.  Patient with circumcised phallus.   Urethral meatus is patent.  No penile discharge. No penile lesions or rashes. Scrotum without lesions, cysts, rashes and/or  edema.  Testicles are located scrotally bilaterally. No masses are appreciated in the testicles. Left and right epididymis are normal. Rectal: Patient with  normal sphincter tone. Anus and perineum without scarring or rashes. No rectal masses are appreciated. Prostate is approximately 45 grams, no nodules are appreciated. Seminal vesicles are normal. Skin: No rashes, bruises or suspicious lesions. Lymph: No cervical or inguinal adenopathy. Neurologic: Grossly intact, no focal deficits, moving all 4 extremities. Psychiatric: Normal mood and affect.   Laboratory Data: Lab Results  Component Value Date   WBC 7.6 04/21/2016   HGB 13.2 04/21/2016   HCT 40.8 04/21/2016   MCV 96.0 04/21/2016   PLT 284.0 04/21/2016   Lab Results  Component Value Date   CREATININE 1.02 04/21/2016   Lab Results  Component Value Date   PSA 0.83 02/06/2015   Lab Results  Component Value Date   HGBA1C 5.8 04/21/2016    Results for orders placed or performed in visit on 04/21/16  CBC with Differential/Platelet  Result Value Ref Range   WBC 7.6 4.0 - 10.5 K/uL   RBC 4.25 4.22 - 5.81 Mil/uL   Hemoglobin 13.2 13.0 - 17.0 g/dL   HCT 40.8 39.0 - 52.0 %   MCV 96.0 78.0 - 100.0 fl   MCHC 32.3 30.0 - 36.0 g/dL   RDW 13.4 11.5 - 15.5 %   Platelets 284.0 150.0 - 400.0 K/uL   Neutrophils Relative % 79.5 (H) 43.0 - 77.0 %   Lymphocytes Relative 11.5 (L) 12.0 - 46.0 %   Monocytes Relative 6.5 3.0 - 12.0 %   Eosinophils Relative 2.2 0.0 - 5.0 %   Basophils Relative 0.3 0.0 - 3.0 %   Neutro Abs 6.0 1.4 - 7.7 K/uL   Lymphs Abs 0.9 0.7 - 4.0 K/uL   Monocytes Absolute 0.5 0.1 - 1.0 K/uL   Eosinophils Absolute 0.2 0.0 - 0.7 K/uL   Basophils Absolute  0.0 0.0 - 0.1 K/uL  Comprehensive metabolic panel  Result Value Ref Range   Sodium 136 135 - 145 mEq/L   Potassium 4.2 3.5 - 5.1 mEq/L   Chloride 92 (L) 96 - 112 mEq/L   CO2 38 (H) 19 - 32 mEq/L   Glucose, Bld 100 (H) 70 - 99 mg/dL   BUN 24 (H) 6 - 23 mg/dL    Creatinine, Ser 1.02 0.40 - 1.50 mg/dL   Total Bilirubin 0.4 0.2 - 1.2 mg/dL   Alkaline Phosphatase 58 39 - 117 U/L   AST 18 0 - 37 U/L   ALT 9 0 - 53 U/L   Total Protein 7.4 6.0 - 8.3 g/dL   Albumin 4.3 3.5 - 5.2 g/dL   Calcium 9.8 8.4 - 10.5 mg/dL   GFR 89.56 >60.00 mL/min  TSH  Result Value Ref Range   TSH 1.38 0.35 - 4.50 uIU/mL  Hemoglobin A1c  Result Value Ref Range   Hgb A1c MFr Bld 5.8 4.6 - 6.5 %    Pertinent Imaging Results for Pino, HESHAM WOMAC (MRN [id number]) as of 11/14/2016 16:37  Ref. Range 11/14/2016 16:10  Scan Result Unknown 119     Assessment & Plan:    1. Nocturia:   Patient has found relief with his CPAP machine.  He is still sleeping with CPAP machine.  He is now having nocturia 2 which is reduction from nocturia  4. He'll return in 6 months for I PSS.  2. BPH with LUTS  - IPSS score is 7/1, it is improved  - Continue conservative management, avoiding bladder irritants and timed voiding's  - Continue finasteride 5 mg daily  - restarted his tamsulosin 0.4 mg  - RTC in 6 months for IPSS and exam   3. SUI  - refer to PT for pelvic floor strengthening  Return in about 6 months (around 05/14/2017) for IPSS and PVR.  Zara Council, [facility] [address], [place], [place] [address] [phone]

## 2016-11-25 ENCOUNTER — Emergency Department: Payer: Medicare Other

## 2016-11-25 ENCOUNTER — Inpatient Hospital Stay
Admission: EM | Admit: 2016-11-25 | Discharge: 2016-11-27 | DRG: 190 | Disposition: A | Discharge status: Home / Self Care | Payer: Medicare Other | Attending: Internal Medicine | Admitting: Internal Medicine

## 2016-11-25 ENCOUNTER — Ambulatory Visit (INDEPENDENT_AMBULATORY_CARE_PROVIDER_SITE_OTHER): Payer: Medicare Other | Admitting: Internal Medicine

## 2016-11-25 ENCOUNTER — Encounter: Payer: Self-pay | Admitting: Internal Medicine

## 2016-11-25 ENCOUNTER — Encounter: Payer: Self-pay | Admitting: Emergency Medicine

## 2016-11-25 VITALS — BP 136/66 | HR 96 | Temp 98.6°F | Wt 148.4 lb

## 2016-11-25 DIAGNOSIS — E119 Type 2 diabetes mellitus without complications: Secondary | ICD-10-CM

## 2016-11-25 DIAGNOSIS — J9601 Acute respiratory failure with hypoxia: Secondary | ICD-10-CM | POA: Diagnosis present

## 2016-11-25 DIAGNOSIS — J441 Chronic obstructive pulmonary disease with (acute) exacerbation: Secondary | ICD-10-CM | POA: Diagnosis present

## 2016-11-25 DIAGNOSIS — I454 Nonspecific intraventricular block: Secondary | ICD-10-CM | POA: Diagnosis present

## 2016-11-25 DIAGNOSIS — Z8711 Personal history of peptic ulcer disease: Secondary | ICD-10-CM | POA: Diagnosis not present

## 2016-11-25 DIAGNOSIS — J440 Chronic obstructive pulmonary disease with acute lower respiratory infection: Secondary | ICD-10-CM | POA: Diagnosis present

## 2016-11-25 DIAGNOSIS — Z87891 Personal history of nicotine dependence: Secondary | ICD-10-CM | POA: Diagnosis not present

## 2016-11-25 DIAGNOSIS — J209 Acute bronchitis, unspecified: Secondary | ICD-10-CM | POA: Diagnosis present

## 2016-11-25 DIAGNOSIS — Z96641 Presence of right artificial hip joint: Secondary | ICD-10-CM | POA: Diagnosis present

## 2016-11-25 DIAGNOSIS — J9602 Acute respiratory failure with hypercapnia: Secondary | ICD-10-CM | POA: Diagnosis present

## 2016-11-25 DIAGNOSIS — G4730 Sleep apnea, unspecified: Secondary | ICD-10-CM | POA: Diagnosis present

## 2016-11-25 DIAGNOSIS — R0602 Shortness of breath: Secondary | ICD-10-CM

## 2016-11-25 DIAGNOSIS — I452 Bifascicular block: Secondary | ICD-10-CM | POA: Diagnosis present

## 2016-11-25 DIAGNOSIS — Z806 Family history of leukemia: Secondary | ICD-10-CM

## 2016-11-25 DIAGNOSIS — R600 Localized edema: Secondary | ICD-10-CM | POA: Diagnosis not present

## 2016-11-25 DIAGNOSIS — R0902 Hypoxemia: Secondary | ICD-10-CM

## 2016-11-25 DIAGNOSIS — E872 Acidosis: Secondary | ICD-10-CM | POA: Diagnosis present

## 2016-11-25 DIAGNOSIS — E1142 Type 2 diabetes mellitus with diabetic polyneuropathy: Secondary | ICD-10-CM | POA: Diagnosis present

## 2016-11-25 DIAGNOSIS — I10 Essential (primary) hypertension: Secondary | ICD-10-CM | POA: Diagnosis not present

## 2016-11-25 DIAGNOSIS — Z808 Family history of malignant neoplasm of other organs or systems: Secondary | ICD-10-CM

## 2016-11-25 DIAGNOSIS — Z7951 Long term (current) use of inhaled steroids: Secondary | ICD-10-CM

## 2016-11-25 DIAGNOSIS — E785 Hyperlipidemia, unspecified: Secondary | ICD-10-CM | POA: Diagnosis present

## 2016-11-25 DIAGNOSIS — R0689 Other abnormalities of breathing: Secondary | ICD-10-CM

## 2016-11-25 DIAGNOSIS — Z888 Allergy status to other drugs, medicaments and biological substances status: Secondary | ICD-10-CM

## 2016-11-25 DIAGNOSIS — E871 Hypo-osmolality and hyponatremia: Secondary | ICD-10-CM | POA: Diagnosis present

## 2016-11-25 DIAGNOSIS — Z79899 Other long term (current) drug therapy: Secondary | ICD-10-CM

## 2016-11-25 DIAGNOSIS — Z86010 Personal history of colonic polyps: Secondary | ICD-10-CM

## 2016-11-25 DIAGNOSIS — Z833 Family history of diabetes mellitus: Secondary | ICD-10-CM

## 2016-11-25 DIAGNOSIS — R0600 Dyspnea, unspecified: Secondary | ICD-10-CM

## 2016-11-25 DIAGNOSIS — Z91040 Latex allergy status: Secondary | ICD-10-CM

## 2016-11-25 LAB — CBC WITH DIFFERENTIAL/PLATELET
BASOS ABS: 0.1 10*3/uL (ref 0–0.1)
Basophils Relative: 1 %
EOS PCT: 1 %
Eosinophils Absolute: 0.1 10*3/uL (ref 0–0.7)
HCT: 38.8 % — ABNORMAL LOW (ref 40.0–52.0)
Hemoglobin: 12.8 g/dL — ABNORMAL LOW (ref 13.0–18.0)
LYMPHS PCT: 11 %
Lymphs Abs: 1.5 10*3/uL (ref 1.0–3.6)
MCH: 30.8 pg (ref 26.0–34.0)
MCHC: 33 g/dL (ref 32.0–36.0)
MCV: 93.3 fL (ref 80.0–100.0)
MONO ABS: 1.1 10*3/uL — AB (ref 0.2–1.0)
MONOS PCT: 8 %
Neutro Abs: 11.4 10*3/uL — ABNORMAL HIGH (ref 1.4–6.5)
Neutrophils Relative %: 79 %
PLATELETS: 335 10*3/uL (ref 150–440)
RBC: 4.16 MIL/uL — ABNORMAL LOW (ref 4.40–5.90)
RDW: 13.9 % (ref 11.5–14.5)
WBC: 14.2 10*3/uL — ABNORMAL HIGH (ref 3.8–10.6)

## 2016-11-25 LAB — BRAIN NATRIURETIC PEPTIDE: B NATRIURETIC PEPTIDE 5: 83 pg/mL (ref 0.0–100.0)

## 2016-11-25 LAB — COMPREHENSIVE METABOLIC PANEL
ALT: 11 U/L — ABNORMAL LOW (ref 17–63)
ANION GAP: 7 (ref 5–15)
AST: 22 U/L (ref 15–41)
Albumin: 3.7 g/dL (ref 3.5–5.0)
Alkaline Phosphatase: 60 U/L (ref 38–126)
BUN: 14 mg/dL (ref 6–20)
CHLORIDE: 86 mmol/L — AB (ref 101–111)
CO2: 36 mmol/L — ABNORMAL HIGH (ref 22–32)
Calcium: 9.1 mg/dL (ref 8.9–10.3)
Creatinine, Ser: 1.16 mg/dL (ref 0.61–1.24)
GFR, EST NON AFRICAN AMERICAN: 56 mL/min — AB (ref >60)
Glucose, Bld: 157 mg/dL — ABNORMAL HIGH (ref 65–99)
POTASSIUM: 4.5 mmol/L (ref 3.5–5.1)
Sodium: 129 mmol/L — ABNORMAL LOW (ref 135–145)
Total Bilirubin: 0.6 mg/dL (ref 0.3–1.2)
Total Protein: 6.9 g/dL (ref 6.5–8.1)

## 2016-11-25 LAB — BLOOD GAS, VENOUS
Acid-Base Excess: 12.4 mmol/L — ABNORMAL HIGH (ref 0.0–2.0)
Bicarbonate: 42.8 mmol/L — ABNORMAL HIGH (ref 20.0–28.0)
FIO2: 0.21
PH VEN: 7.29 (ref 7.250–7.430)
Patient temperature: 37
pCO2, Ven: 89 mmHg (ref 44.0–60.0)
pO2, Ven: <31 mmHg — CL (ref 32.0–45.0)

## 2016-11-25 LAB — FIBRIN DERIVATIVES D-DIMER (ARMC ONLY): FIBRIN DERIVATIVES D-DIMER (ARMC): 846.76 — AB (ref 0–499)

## 2016-11-25 LAB — MAGNESIUM: Magnesium: 1.5 mg/dL — ABNORMAL LOW (ref 1.7–2.4)

## 2016-11-25 LAB — GLUCOSE, CAPILLARY: GLUCOSE-CAPILLARY: 217 mg/dL — AB (ref 65–99)

## 2016-11-25 LAB — LACTIC ACID, PLASMA: LACTIC ACID, VENOUS: 1.4 mmol/L (ref 0.5–1.9)

## 2016-11-25 LAB — TROPONIN I

## 2016-11-25 MED ORDER — SENNOSIDES-DOCUSATE SODIUM 8.6-50 MG PO TABS: 1.0000 | ORAL_TABLET | Freq: Every evening | ORAL | Status: DC | PRN

## 2016-11-25 MED ORDER — IPRATROPIUM-ALBUTEROL 0.5-2.5 (3) MG/3ML IN SOLN
3.0000 mL | Freq: Once | RESPIRATORY_TRACT | Status: AC
  Administered 2016-11-25: 3 mL via RESPIRATORY_TRACT
  Filled 2016-11-25: qty 3

## 2016-11-25 MED ORDER — GUAIFENESIN-DM 100-10 MG/5ML PO SYRP: 5.0000 mL | ORAL_SOLUTION | ORAL | Status: DC | PRN

## 2016-11-25 MED ORDER — METHYLPREDNISOLONE SODIUM SUCC 40 MG IJ SOLR
40.0000 mg | Freq: Two times a day (BID) | INTRAMUSCULAR | Status: DC
  Administered 2016-11-25 – 2016-11-27 (×4): 40 mg via INTRAVENOUS
  Filled 2016-11-25 (×4): qty 1

## 2016-11-25 MED ORDER — SODIUM CHLORIDE 0.9% FLUSH: 3.0000 mL | INTRAVENOUS | Status: DC | PRN

## 2016-11-25 MED ORDER — ONDANSETRON HCL 4 MG/2ML IJ SOLN: 4.0000 mg | Freq: Four times a day (QID) | INTRAMUSCULAR | Status: DC | PRN

## 2016-11-25 MED ORDER — INSULIN ASPART 100 UNIT/ML ~~LOC~~ SOLN
0.0000 [IU] | Freq: Every day | SUBCUTANEOUS | Status: DC
  Administered 2016-11-25: 2 [IU] via SUBCUTANEOUS
  Filled 2016-11-25: qty 2

## 2016-11-25 MED ORDER — IOPAMIDOL (ISOVUE-370) INJECTION 76%
75.0000 mL | Freq: Once | INTRAVENOUS | Status: AC | PRN
  Administered 2016-11-25: 75 mL via INTRAVENOUS

## 2016-11-25 MED ORDER — ALBUTEROL SULFATE (2.5 MG/3ML) 0.083% IN NEBU: 2.5000 mg | INHALATION_SOLUTION | RESPIRATORY_TRACT | Status: DC | PRN

## 2016-11-25 MED ORDER — FINASTERIDE 5 MG PO TABS
5.0000 mg | ORAL_TABLET | Freq: Every day | ORAL | Status: DC
  Administered 2016-11-25 – 2016-11-27 (×3): 5 mg via ORAL
  Filled 2016-11-25 (×3): qty 1

## 2016-11-25 MED ORDER — PANTOPRAZOLE SODIUM 40 MG PO TBEC
40.0000 mg | DELAYED_RELEASE_TABLET | Freq: Every day | ORAL | Status: DC
  Administered 2016-11-25 – 2016-11-27 (×3): 40 mg via ORAL
  Filled 2016-11-25 (×3): qty 1

## 2016-11-25 MED ORDER — PREDNISONE 20 MG PO TABS
40.0000 mg | ORAL_TABLET | Freq: Once | ORAL | Status: AC
  Administered 2016-11-25: 40 mg via ORAL
  Filled 2016-11-25: qty 2

## 2016-11-25 MED ORDER — SODIUM CHLORIDE 0.9% FLUSH
3.0000 mL | Freq: Two times a day (BID) | INTRAVENOUS | Status: DC
  Administered 2016-11-25 – 2016-11-27 (×4): 3 mL via INTRAVENOUS

## 2016-11-25 MED ORDER — FLUTICASONE FUROATE-VILANTEROL 100-25 MCG/INH IN AEPB
1.0000 | INHALATION_SPRAY | Freq: Every day | RESPIRATORY_TRACT | Status: DC
  Administered 2016-11-25 – 2016-11-27 (×3): 1 via RESPIRATORY_TRACT
  Filled 2016-11-25: qty 28

## 2016-11-25 MED ORDER — LEVOTHYROXINE SODIUM 50 MCG PO TABS
50.0000 ug | ORAL_TABLET | Freq: Every day | ORAL | Status: DC
  Administered 2016-11-26 – 2016-11-27 (×2): 50 ug via ORAL
  Filled 2016-11-25 (×2): qty 1

## 2016-11-25 MED ORDER — TAMSULOSIN HCL 0.4 MG PO CAPS
0.4000 mg | ORAL_CAPSULE | Freq: Every day | ORAL | Status: DC
  Administered 2016-11-25 – 2016-11-27 (×3): 0.4 mg via ORAL
  Filled 2016-11-25 (×3): qty 1

## 2016-11-25 MED ORDER — LOSARTAN POTASSIUM 50 MG PO TABS
50.0000 mg | ORAL_TABLET | Freq: Every day | ORAL | Status: DC
  Administered 2016-11-25 – 2016-11-27 (×3): 50 mg via ORAL
  Filled 2016-11-25 (×3): qty 1

## 2016-11-25 MED ORDER — IPRATROPIUM-ALBUTEROL 0.5-2.5 (3) MG/3ML IN SOLN
3.0000 mL | Freq: Four times a day (QID) | RESPIRATORY_TRACT | Status: DC
  Administered 2016-11-25 – 2016-11-26 (×5): 3 mL via RESPIRATORY_TRACT
  Filled 2016-11-25 (×5): qty 3

## 2016-11-25 MED ORDER — INSULIN ASPART 100 UNIT/ML ~~LOC~~ SOLN
0.0000 [IU] | Freq: Three times a day (TID) | SUBCUTANEOUS | Status: DC
  Administered 2016-11-26: 2 [IU] via SUBCUTANEOUS
  Administered 2016-11-26: 5 [IU] via SUBCUTANEOUS
  Administered 2016-11-26 – 2016-11-27 (×2): 2 [IU] via SUBCUTANEOUS
  Filled 2016-11-25: qty 2
  Filled 2016-11-25: qty 5
  Filled 2016-11-25 (×2): qty 2

## 2016-11-25 MED ORDER — ALBUTEROL SULFATE HFA 108 (90 BASE) MCG/ACT IN AERS: 1.0000 | INHALATION_SPRAY | Freq: Four times a day (QID) | RESPIRATORY_TRACT | Status: DC | PRN

## 2016-11-25 MED ORDER — ACETAMINOPHEN 650 MG RE SUPP: 650.0000 mg | Freq: Four times a day (QID) | RECTAL | Status: DC | PRN

## 2016-11-25 MED ORDER — ONDANSETRON HCL 4 MG PO TABS: 4.0000 mg | ORAL_TABLET | Freq: Four times a day (QID) | ORAL | Status: DC | PRN

## 2016-11-25 MED ORDER — SODIUM CHLORIDE 0.9 % IV SOLN: 250.0000 mL | INTRAVENOUS | Status: DC | PRN

## 2016-11-25 MED ORDER — AZITHROMYCIN 500 MG PO TABS
500.0000 mg | ORAL_TABLET | Freq: Once | ORAL | Status: AC
Start: 2016-11-25 — End: 2016-11-25
  Administered 2016-11-25: 500 mg via ORAL
  Filled 2016-11-25: qty 1

## 2016-11-25 MED ORDER — ACETAMINOPHEN 325 MG PO TABS: 650.0000 mg | ORAL_TABLET | Freq: Four times a day (QID) | ORAL | Status: DC | PRN

## 2016-11-25 MED ORDER — ENOXAPARIN SODIUM 40 MG/0.4ML ~~LOC~~ SOLN
40.0000 mg | SUBCUTANEOUS | Status: DC
  Administered 2016-11-25 – 2016-11-26 (×2): 40 mg via SUBCUTANEOUS
  Filled 2016-11-25 (×2): qty 0.4

## 2016-11-25 MED ORDER — AZITHROMYCIN 500 MG PO TABS
500.0000 mg | ORAL_TABLET | Freq: Every day | ORAL | Status: DC
  Administered 2016-11-26 – 2016-11-27 (×2): 500 mg via ORAL
  Filled 2016-11-25 (×2): qty 1

## 2016-11-25 NOTE — Progress Notes (Signed)
Patient ID: AZAHEL BELCASTRO, male   DOB: [birth year], 83 y.o.   MRN: [id number]   Subjective:    [name], male    DOB: 18-Dec-1929, 83 y.o.   MRN: [id number]  HPI  Patient here as a work in with concerns regarding increased cough and congestion.  His wife was being seen and asked if her husband could be worked in for evaluation for increased sob.  On questioning him, he has noticed increased sob.  Mostly on stairs.  He has had some previous sob, but wife reports is worse over the last few weeks and especially over the last week.  Some drainage, but no significant cough.  No wheezing.  Eating.  No nausea or vomiting. Bowels moving.  Sees Dr Alva Garnet.  Not on oxygen.  O2 sat initially after walking in was 85%.  With 3 minute walk - decreased to 86%.  No chest pain.     Past Medical History:  Diagnosis Date  . Carpal tunnel syndrome   . COPD with asthma ([place])   . Diabetes mellitus    Diet control   . DJD (degenerative joint disease), cervical   . Duodenal ulcer, with partial obstruction 08/10/2012  . Essential and other specified forms of tremor 12/16/2013  . Hyperlipidemia   . Hypertension    no medicine needed  . IBS (irritable bowel syndrome)   . Internal hemorrhoids   . Memory deficit 12/16/2013  . Numbness and tingling in right hand    started 2 yeas ago  . Peptic ulcer   . Personal history of colonic polyps    adenomatous  . Polyneuropathy in diabetes(357.2)   . Tremor, essential   . Vitamin D deficiency    Past Surgical History:  Procedure Laterality Date  . ANAL FISSURE REPAIR    . ANKLE SURGERY Right    right- pins placed in  . COLONOSCOPY W/ BIOPSIES AND POLYPECTOMY  8/03, 6/05, 7/09, 9/10   internal hemorrhoids, tubular adenomas, mucosa & lymphoid nodules  . TOTAL HIP ARTHROPLASTY Right 11/13/2012   Procedure: TOTAL HIP ARTHROPLASTY ANTERIOR APPROACH;  Surgeon: Mcarthur Rossetti, MD;  Location: [facility];  Service: Orthopedics;  Laterality: Right;   Right total hip arthroplasty  . UPPER GASTROINTESTINAL ENDOSCOPY  3/05, 7/09, 9/10,2013   gastritis, duodenitis   Family History  Problem Relation Age of Onset  . Thyroid cancer Daughter 12  . Diabetes Son   . Urolithiasis Son   . Leukemia Maternal Aunt   . Colon cancer Neg Hx   . Esophageal cancer Neg Hx   . Rectal cancer Neg Hx   . Stomach cancer Neg Hx   . Prostate cancer Neg Hx   . Kidney disease Neg Hx   . Kidney cancer Neg Hx    Social History   Social History  . Marital status: Married    Spouse name: N/A  . Number of children: 3  . Years of education: MA   Occupational History  . 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 Smoking status: Former Smoker    Types: Cigarettes    Quit date: 07/02/1963  . Smokeless tobacco: Never Used  . Alcohol use No  . Drug use: No  . Sexual activity: Not Asked   Other Topics Concern  . None   Social History Narrative   Veteran [employer]    Outpatient Encounter Prescriptions as of 11/25/2016  Medication Sig  .  albuterol (PROAIR HFA) 108 (90 Base) MCG/ACT inhaler Inhale 1-2 puffs into the lungs every 6 (six) hours as needed for wheezing or shortness of breath.  . finasteride (PROSCAR) 5 MG tablet Take 1 tablet (5 mg total) by mouth daily.  . fluticasone furoate-vilanterol (BREO ELLIPTA) 100-25 MCG/INH AEPB Inhale 1 puff into the lungs daily.  . furosemide (LASIX) 20 MG tablet Take 1 tablet (20 mg total) by mouth daily. (Patient taking differently: Take 40 mg by mouth daily. )  . levothyroxine (SYNTHROID, LEVOTHROID) 50 MCG tablet Take 50 mcg by mouth daily before breakfast.  . losartan (COZAAR) 50 MG tablet TAKE 1 TABLET BY MOUTH ONCE DAILY.  . pantoprazole (PROTONIX) 40 MG tablet Take 1 tablet (40 mg total) by mouth daily.  Marland Kitchen saccharomyces boulardii (FLORASTOR) 250 MG capsule Take 250 mg by mouth as needed.   . tamsulosin (FLOMAX) 0.4 MG CAPS capsule Take 1 capsule (0.4 mg total) by mouth daily.   No  facility-administered encounter medications on file as of 11/25/2016.     Review of Systems  Constitutional: Positive for fatigue. Negative for appetite change.  HENT: Positive for congestion and postnasal drip. Negative for sinus pressure.   Respiratory: Positive for shortness of breath. Negative for chest tightness.        No significant cough  Cardiovascular: Negative for chest pain and palpitations.  Gastrointestinal: Negative for diarrhea, nausea and vomiting.  Genitourinary: Negative for dysuria and hematuria.  Musculoskeletal: Negative for back pain and myalgias.  Neurological: Negative for dizziness, light-headedness and headaches.  Psychiatric/Behavioral: Negative for agitation and dysphoric mood.       Objective:    Physical Exam  Constitutional: He appears well-developed and well-nourished.  Breathing more labored than on previous visits.    HENT:  Nose: Nose normal.  Mouth/Throat: Oropharynx is clear and moist.  Neck: Neck supple.  Cardiovascular: Normal rate and regular rhythm.   Pulmonary/Chest: Effort normal. No respiratory distress.  No wheezing.  No cough with forced expiration.   Abdominal: Soft. Bowel sounds are normal. There is no tenderness.  Musculoskeletal:  Positive pedal and ankle/lower extremity edema.    Lymphadenopathy:    He has no cervical adenopathy.  Skin: No rash noted. No erythema.  Psychiatric: He has a normal mood and affect. His behavior is normal.    BP 136/66 (BP Location: Left Arm, Patient Position: Sitting, Cuff Size: Normal)   Pulse 96   Temp 98.6 F (37 C) (Oral)   Wt 148 lb 6.4 oz (67.3 kg)   SpO2 (!) 85%   BMI 26.29 kg/m  Wt Readings from Last 3 Encounters:  11/25/16 148 lb 6.4 oz (67.3 kg)  11/14/16 147 lb (66.7 kg)  08/23/16 147 lb 9.6 oz (67 kg)     Lab Results  Component Value Date   WBC 7.6 04/21/2016   HGB 13.2 04/21/2016   HCT 40.8 04/21/2016   PLT 284.0 04/21/2016   GLUCOSE 100 (H) 04/21/2016   CHOL 223 (H)  06/24/2015   TRIG 53.0 06/24/2015   HDL 96.10 06/24/2015   LDLCALC 116 (H) 06/24/2015   ALT 9 04/21/2016   AST 18 04/21/2016   NA 136 04/21/2016   K 4.2 04/21/2016   CL 92 (L) 04/21/2016   CREATININE 1.02 04/21/2016   BUN 24 (H) 04/21/2016   CO2 38 (H) 04/21/2016   TSH 1.38 04/21/2016   PSA 0.83 02/06/2015   INR 0.94 11/06/2012   HGBA1C 5.8 04/21/2016   MICROALBUR 93.8 (H) 06/24/2015  Assessment & Plan:   Problem List Items Addressed This Visit    COPD (chronic obstructive pulmonary disease) (HCC)    Sees Dr Alva Garnet.  On wheezing or increased cough noted on exam.  Some drainage, but no other congestion.  Using inhalers.  Increased sob recently.  Decreased O2 sats as outlined.  To ER for further evaluation as outlined.        Diabetes mellitus, type II ([place])    Sugars have been doing well.  Follow.        Hypertension    Blood pressure under good control.  Continue same medication regimen.  Follow pressures.  Follow metabolic panel.        Hypoxia    Pt not currently on oxygen.  O2 sats 85% initially when coming in today.  3 minute walk, breathing more labored and O2 sats decreased to 86%.  Change for him.  Some increased sob recently.  Unclear etiology.  EKG - SR, RBBB, no acute ischemic changes.  After discussion and given change in clinical status with hypoxia and sob, will refer to ER for further w/up as outlined (to try and determine etiology of change).        Lower extremity edema    Has been an issue previously.  Some increased swelling today.  With the sob and swelling, need to confirm etiology.  After discussion with the pt and his wife, will refer to ER for further evaluation to confirm no cardiac source.  Question of need for CT to confirm no PE.  Further evaluation and w/up pending ER assessment.         Other Visit Diagnoses    SOB (shortness of breath)    -  Primary   Relevant Orders   EKG 12-Lead (Completed)     I spent 45 minutes with the patient  and more than 50% of the time was spent in consultation regarding the above.  Time spent in discussing his ongoing symptoms and assessing current clinical status.  Time also spent discussing plans for further w/up.    [name], MD

## 2016-11-25 NOTE — Assessment & Plan Note (Signed)
Sugars have been doing well.  Follow.

## 2016-11-25 NOTE — ED Notes (Signed)
Pt ambulated with dynamap. Pt maintained O2 Sat of 98-99% while ambulating.

## 2016-11-25 NOTE — Progress Notes (Signed)
Pre visit review using our clinic review tool, if applicable. No additional management support is needed unless otherwise documented below in the visit note. 

## 2016-11-25 NOTE — Progress Notes (Signed)
Advanced Care Plan.  Purpose of Encounter: Call the status. Parties in Attendance: The patient, his wife and me. Patient's Decisional Capacity: Yes. Subjective/Patient Story: The patient presents in the ED with worsening shortness of breath for 2 weeks. Objective/Medical Story: He was found acute respiratory failure with hypoxia and hypercapnia due to COPD exacerbation. I discussed with the patient and his wife about CODE STATUS. They want full code for now.  Goals of Care Determinations: Full code, continue current treatment. Plan:  Code Status: Full code  Time spent discussing advance care planning: 17 minutes.

## 2016-11-25 NOTE — ED Provider Notes (Signed)
[facility] Emergency Department Provider Note   ____________________________________________   First MD Initiated Contact with Patient 11/25/16 1522     (approximate)  I have reviewed the triage vital signs and the nursing notes.   HISTORY  Chief Complaint Shortness of Breath   HPI Aaron Hendrix is a 83 y.o. male here for evaluation of cough with shortness of breath. The patient and his wife reports that for the last 2 weeks he's had a cough, nonproductive with shortness of breath especially while walking. He infrequently uses his albuterol, but last night had to use it because after walking down the stairs he became very short of breath. The provided little relief. No fevers or chills. He has had some body aches though. No chest pain. He does get very short of breath on walking, but at rest he does not feel short of breath.  Patient had oxygen saturation less than 90%, documented by Dr. Nicki Reaper at the clinic today.  No shortness of breath laying flat. Does feel slightly swollen in his ankles.   Past Medical History:  Diagnosis Date  . Carpal tunnel syndrome   . COPD with asthma ([place])   . Diabetes mellitus    Diet control   . DJD (degenerative joint disease), cervical   . Duodenal ulcer, with partial obstruction 08/10/2012  . Essential and other specified forms of tremor 12/16/2013  . Hyperlipidemia   . Hypertension    no medicine needed  . IBS (irritable bowel syndrome)   . Internal hemorrhoids   . Memory deficit 12/16/2013  . Numbness and tingling in right hand    started 2 yeas ago  . Peptic ulcer   . Personal history of colonic polyps    adenomatous  . Polyneuropathy in diabetes(357.2)   . Tremor, essential   . Vitamin D deficiency     Patient Active Problem List   Diagnosis Date Noted  . Hypoxia 11/25/2016  . Rash 01/18/2016  . Nocturia 08/11/2015  . BPH with obstruction/lower urinary tract symptoms 08/11/2015  . Hematemesis  07/29/2015  . Paralytic ileus ([place]) 07/29/2015  . Lower extremity edema 07/16/2015  . Increased urinary frequency 07/16/2015  . COPD (chronic obstructive pulmonary disease) ([place]) 07/16/2015  . Malnutrition of moderate degree ([place]) 07/02/2015  . Community acquired pneumonia   . Hyponatremia 06/25/2015  . Skin lesion 02/16/2015  . Health care maintenance 02/16/2015  . Elbow abrasion 09/27/2014  . Hypothyroidism 09/27/2014  . Chronic constipation 09/01/2014  . Trigger thumb 06/22/2014  . Abdominal pain 06/22/2014  . Personal history of other specified conditions 02/20/2014  . Decreased anal sphincter tone 02/19/2014  . Other specified symptoms and signs involving the digestive system and abdomen 02/19/2014  . Diabetes mellitus ([place]) 02/19/2014  . Farts 02/19/2014  . Memory deficit 12/16/2013  . Essential and other specified forms of tremor 12/16/2013  . Amnesia 12/16/2013  . Diarrhea 10/24/2013  . Loss of weight 09/10/2013  . Periumbilical abdominal pain 09/10/2013  . Pain in lower limb 07/01/2013  . Dermatophytosis of foot 07/01/2013  . Degenerative arthritis of hip 11/13/2012  . Osteoarthritis of hip 11/13/2012  . DU (duodenal ulcer) 08/22/2012  . Nausea 08/10/2012  . Hypertension 05/25/2011  . Diabetes mellitus, type II ([place]) 05/25/2011  . Irritable bowel syndrome 03/31/2008  . History of colonic polyps 03/31/2008  . Adaptive colitis 03/31/2008    Past Surgical History:  Procedure Laterality Date  . ANAL FISSURE REPAIR    . ANKLE SURGERY Right  right- pins placed in  . COLONOSCOPY W/ BIOPSIES AND POLYPECTOMY  8/03, 6/05, 7/09, 9/10   internal hemorrhoids, tubular adenomas, mucosa & lymphoid nodules  . TOTAL HIP ARTHROPLASTY Right 11/13/2012   Procedure: TOTAL HIP ARTHROPLASTY ANTERIOR APPROACH;  Surgeon: Mcarthur Rossetti, MD;  Location: [facility];  Service: Orthopedics;  Laterality: Right;  Right total hip arthroplasty  . UPPER GASTROINTESTINAL ENDOSCOPY  3/05,  7/09, 9/10,2013   gastritis, duodenitis    Prior to Admission medications   Medication Sig Start Date End Date Taking? Authorizing Provider  finasteride (PROSCAR) 5 MG tablet Take 1 tablet (5 mg total) by mouth daily. 11/14/16  Yes Shannon A [name], PA-C  furosemide (LASIX) 20 MG tablet Take 1 tablet (20 mg total) by mouth daily. Patient taking differently: Take 40 mg by mouth daily.  07/22/15  Yes Vipul Manuella Ghazi, MD  levothyroxine (SYNTHROID, LEVOTHROID) 50 MCG tablet Take 50 mcg by mouth daily before breakfast.   Yes Reynold Bowen, MD  losartan (COZAAR) 50 MG tablet TAKE 1 TABLET BY MOUTH ONCE DAILY. 07/25/16  Yes [name], MD  pantoprazole (PROTONIX) 40 MG tablet Take 1 tablet (40 mg total) by mouth daily. 04/21/16  Yes [name], MD  saccharomyces boulardii (FLORASTOR) 250 MG capsule Take 250 mg by mouth as needed.    Yes Historical Provider, MD  tamsulosin (FLOMAX) 0.4 MG CAPS capsule Take 1 capsule (0.4 mg total) by mouth daily. 11/14/16  Yes Shannon A [name], PA-C  albuterol (PROAIR HFA) 108 (90 Base) MCG/ACT inhaler Inhale 1-2 puffs into the lungs every 6 (six) hours as needed for wheezing or shortness of breath. 07/07/16   [name], MD  fluticasone furoate-vilanterol (BREO ELLIPTA) 100-25 MCG/INH AEPB Inhale 1 puff into the lungs daily. 08/12/16   [name], MD    Allergies Fexofenadine; Quinapril hcl; and Latex  Family History  Problem Relation Age of Onset  . Thyroid cancer Daughter 65  . Diabetes Son   . Urolithiasis Son   . Leukemia Maternal Aunt   . Colon cancer Neg Hx   . Esophageal cancer Neg Hx   . Rectal cancer Neg Hx   . Stomach cancer Neg Hx   . Prostate cancer Neg Hx   . Kidney disease Neg Hx   . Kidney cancer Neg Hx     Social History Social History  Substance Use Topics  . Smoking status: Former Smoker    Types: Cigarettes    Quit date: 07/02/1963  . Smokeless tobacco: Never Used  . Alcohol use No    Review of  Systems Constitutional: No fever/chills. Some fatigue Eyes: No visual changes. ENT: No sore throat. Cardiovascular: Denies chest pain. Respiratory: see history of present illness sGastrointestinal: No abdominal pain.  No nausea, no vomiting.   Genitourinary: Negative for dysuria. Musculoskeletal: Negative for back pain. Skin: Negative for rash. Neurological: Negative for headaches, focal weakness or numbness.  10-point ROS otherwise negative.  ____________________________________________   PHYSICAL EXAM:  VITAL SIGNS: ED Triage Vitals [11/25/16 1422]  Enc Vitals Group     BP 132/65     Pulse Rate 75     Resp 18     Temp 97.7 F (36.5 C)     Temp Source Oral     SpO2 95 %     Weight 148 lb (67.1 kg)     Height '5\' 3"'$  (1.6 m)     Head Circumference      Peak Flow      Pain  Score      Pain Loc      Pain Edu?      Excl. in [place]?     Constitutional: Alert and oriented. Well appearing and in no acute distressThough does appear just slightly dyspneic. Eyes: Conjunctivae are normal. PERRL. EOMI. Head: Atraumatic. Nose: No congestion/rhinnorhea. Mouth/Throat: Mucous membranes are moist.  Oropharynx non-erythematous. Neck: No stridor.   Cardiovascular: Normal rate, regular rhythm. Grossly normal heart sounds.  Good peripheral circulation. Respiratory: Mild use of accessory muscles. He speaks in phrases. He has somewhat diminished air movement throughout, but no wheezing is noted. No focal rales or crackles. No extremities Gastrointestinal: Soft and nontender. No distention.  Musculoskeletal: No lower extremity tenderness just trace edema at the ankles Neurologic:  Normal speech and language. No gross focal neurologic deficits are appreciated.  Skin:  Skin is warm, dry and intact. No rash noted. Psychiatric: Mood and affect are normal. Speech and behavior are normal.  ____________________________________________   LABS (all labs ordered are listed, but only abnormal results  are displayed)  Labs Reviewed  CBC WITH DIFFERENTIAL/PLATELET - Abnormal; Notable for the following:       Result Value   WBC 14.2 (*)    RBC 4.16 (*)    Hemoglobin 12.8 (*)    HCT 38.8 (*)    Neutro Abs 11.4 (*)    Monocytes Absolute 1.1 (*)    All other components within normal limits  COMPREHENSIVE METABOLIC PANEL - Abnormal; Notable for the following:    Sodium 129 (*)    Chloride 86 (*)    CO2 36 (*)    Glucose, Bld 157 (*)    ALT 11 (*)    GFR calc non Af Amer 56 (*)    All other components within normal limits  FIBRIN DERIVATIVES D-DIMER ([facility] ONLY) - Abnormal; Notable for the following:    Fibrin derivatives D-dimer ([facility]) 846.76 (*)    All other components within normal limits  BLOOD GAS, VENOUS - Abnormal; Notable for the following:    pCO2, Ven 89 (*)    pO2, Ven <31.0 (*)    Bicarbonate 42.8 (*)    Acid-Base Excess 12.4 (*)    All other components within normal limits  CULTURE, BLOOD (ROUTINE X 2)  CULTURE, BLOOD (ROUTINE X 2)  TROPONIN I  LACTIC ACID, PLASMA  BRAIN NATRIURETIC PEPTIDE   ____________________________________________  EKG  Reviewed and interpreted by me at 1425 Ventricular rate 80 QRS 120 QTc 460 Bifascicular block, no ischemic changes noted EKG appears consistent with his previous 12-lead from October 2016 ____________________________________________  RADIOLOGY  Dg Chest 2 View  Result Date: 11/25/2016 CLINICAL DATA:  83 y/o M; weakness and increasing shortness of breath. EXAM: CHEST  2 VIEW COMPARISON:  07/20/2015 chest radiograph FINDINGS: Normal stable cardiac silhouette. Bowel supra position over liver. Clear lungs. Mild dextrocurvature of thoracic spine and degenerative changes. No pleural effusion or pneumothorax. IMPRESSION: No active cardiopulmonary disease. Electronically Signed   By: Kristine Garbe M.D.   On: 11/25/2016 14:57    ____________________________________________   PROCEDURES  Procedure(s) performed:  None  Procedures  Critical Care performed: No  ____________________________________________   INITIAL IMPRESSION / ASSESSMENT AND PLAN / ED COURSE  Pertinent labs & imaging results that were available during my care of the patient were reviewed by me and considered in my medical decision making (see chart for details).  Dyspnea, upper respiratory symptoms, cough and increased shortness of breath now using his albuterol at home. Presented  to clinic with hypoxia with ambulation. Continues to endorse shortness of breath, though mostly with exertion. Mild increased work of breathing. Somewhat poor historian volumes on exam. Appears most consistent with COPD exacerbation, possibly secondary to viral illness, exclude pneumonia, exclude coronary syndrome, and given his associated dyspnea I did send a d-dimer to evaluate for pulmonary emboli  No chest pain. Reassuring 12-lead. ----------------------------------------- 7:18 PM on 11/25/2016 -----------------------------------------  Decision feeling improvement. His blood gas with notably elevated CO2. I discussed with pulmonary, Dr. Lake Bells, he recommends based on the patient's hypoxia at the clinic as well as significantly elevated venous CO2 that the patient be admitted for close observation and pulmonary therapy overnight. Patient and his wife both agreeable. He does report improvement, but I do agree that admission appears in appropriate disposition given to associated elevated CO2 and hypoxia earlier today.  I have ordered a CT angiography to exclude pulmonary embolus and at this time, pending at the time of admission. Case and care discussed with Dr. Bridgett Larsson of the hospitalist service      ____________________________________________   FINAL CLINICAL IMPRESSION(S) / ED DIAGNOSES  Final diagnoses:  COPD exacerbation ([place])  Hypercarbia  Hypoxia  Dyspnea      NEW MEDICATIONS STARTED DURING THIS VISIT:  New Prescriptions   No  medications on file     Note:  This document was prepared using Dragon voice recognition software and may include unintentional dictation errors.     Delman Kitten, MD 11/25/16 Lurena Nida

## 2016-11-25 NOTE — ED Notes (Signed)
Pt transport to 159 

## 2016-11-25 NOTE — ED Triage Notes (Signed)
Pt started having DOE for last week per wife. She will note after he comes back upstairs he seems to have trouble breathing.  Has had cough but nothing comes up. No fevers.  No distress, breathing unlabored.

## 2016-11-25 NOTE — Assessment & Plan Note (Signed)
Sees Dr Alva Garnet.  On wheezing or increased cough noted on exam.  Some drainage, but no other congestion.  Using inhalers.  Increased sob recently.  Decreased O2 sats as outlined.  To ER for further evaluation as outlined.

## 2016-11-25 NOTE — Assessment & Plan Note (Signed)
Pt not currently on oxygen.  O2 sats 85% initially when coming in today.  3 minute walk, breathing more labored and O2 sats decreased to 86%.  Change for him.  Some increased sob recently.  Unclear etiology.  EKG - SR, RBBB, no acute ischemic changes.  After discussion and given change in clinical status with hypoxia and sob, will refer to ER for further w/up as outlined (to try and determine etiology of change).

## 2016-11-25 NOTE — ED Notes (Signed)
Report attempted x 1

## 2016-11-25 NOTE — Assessment & Plan Note (Signed)
Has been an issue previously.  Some increased swelling today.  With the sob and swelling, need to confirm etiology.  After discussion with the pt and his wife, will refer to ER for further evaluation to confirm no cardiac source.  Question of need for CT to confirm no PE.  Further evaluation and w/up pending ER assessment.

## 2016-11-25 NOTE — Assessment & Plan Note (Signed)
Blood pressure under good control.  Continue same medication regimen.  Follow pressures.  Follow metabolic panel.   

## 2016-11-25 NOTE — H&P (Addendum)
[facility] [place] NAME: Aaron Hendrix    MR#:  [id number]  DATE OF BIRTH:  04-28-1930  DATE OF ADMISSION:  11/25/2016  PRIMARY CARE PHYSICIAN: [name], MD   REQUESTING/REFERRING PHYSICIAN: Delman Kitten, MD  CHIEF COMPLAINT:   Chief Complaint  Patient presents with  . Shortness of Breath   Worsening shortness of breath for 2 weeks. HISTORY OF PRESENT ILLNESS:  Aaron Hendrix  is a 83 y.o. male with a known history of COPD, hypertension, diabetes, hyperlipidemia, PUD and diabetic neuropathy. The patient presently ED with worsening cough and shortness of breath for 2 weeks. He was found hypoxia with O2 saturation at 85%, treated with DuoNeb several times the ED. Chest x-ray didn't show any infiltrate. D-dimer is mildly elevated. No recent travel history.  PAST MEDICAL HISTORY:   Past Medical History:  Diagnosis Date  . Carpal tunnel syndrome   . COPD with asthma ([place])   . Diabetes mellitus    Diet control   . DJD (degenerative joint disease), cervical   . Duodenal ulcer, with partial obstruction 08/10/2012  . Essential and other specified forms of tremor 12/16/2013  . Hyperlipidemia   . Hypertension    no medicine needed  . IBS (irritable bowel syndrome)   . Internal hemorrhoids   . Memory deficit 12/16/2013  . Numbness and tingling in right hand    started 2 yeas ago  . Peptic ulcer   . Personal history of colonic polyps    adenomatous  . Polyneuropathy in diabetes(357.2)   . Tremor, essential   . Vitamin D deficiency     PAST SURGICAL HISTORY:   Past Surgical History:  Procedure Laterality Date  . ANAL FISSURE REPAIR    . ANKLE SURGERY Right    right- pins placed in  . COLONOSCOPY W/ BIOPSIES AND POLYPECTOMY  8/03, 6/05, 7/09, 9/10   internal hemorrhoids, tubular adenomas, mucosa & lymphoid nodules  . TOTAL HIP ARTHROPLASTY Right 11/13/2012   Procedure: TOTAL HIP ARTHROPLASTY ANTERIOR APPROACH;  Surgeon:  Mcarthur Rossetti, MD;  Location: [facility];  Service: Orthopedics;  Laterality: Right;  Right total hip arthroplasty  . UPPER GASTROINTESTINAL ENDOSCOPY  3/05, 7/09, 9/10,2013   gastritis, duodenitis    SOCIAL HISTORY:   Social History  Substance Use Topics  . Smoking status: Former Smoker    Types: Cigarettes    Quit date: 07/02/1963  . Smokeless tobacco: Never Used  . Alcohol use No    FAMILY HISTORY:   Family History  Problem Relation Age of Onset  . Thyroid cancer Daughter 32  . Diabetes Son   . Urolithiasis Son   . Leukemia Maternal Aunt   . Colon cancer Neg Hx   . Esophageal cancer Neg Hx   . Rectal cancer Neg Hx   . Stomach cancer Neg Hx   . Prostate cancer Neg Hx   . Kidney disease Neg Hx   . Kidney cancer Neg Hx     DRUG ALLERGIES:   Allergies  Allergen Reactions  . Fexofenadine Other (See Comments)  . Quinapril Hcl Other (See Comments)    Unknown   . Latex Rash    REVIEW OF SYSTEMS:   Review of Systems  Constitutional: Positive for malaise/fatigue. Negative for chills and fever.  HENT: Negative for congestion and sore throat.   Eyes: Negative for blurred vision and double vision.  Respiratory: Positive for cough, sputum production and shortness of breath. Negative for hemoptysis,  wheezing and stridor.   Cardiovascular: Negative for chest pain, palpitations and leg swelling.  Gastrointestinal: Negative for abdominal pain, blood in stool, diarrhea, melena, nausea and vomiting.  Genitourinary: Negative for dysuria and hematuria.  Musculoskeletal: Negative for back pain.  Neurological: Positive for weakness. Negative for dizziness, focal weakness and loss of consciousness.  Psychiatric/Behavioral: Negative for depression. The patient is not nervous/anxious.     MEDICATIONS AT HOME:   Prior to Admission medications   Medication Sig Start Date End Date Taking? Authorizing Provider  finasteride (PROSCAR) 5 MG tablet Take 1 tablet (5 mg total) by mouth  daily. 11/14/16  Yes Shannon A [name], PA-C  furosemide (LASIX) 20 MG tablet Take 1 tablet (20 mg total) by mouth daily. Patient taking differently: Take 40 mg by mouth daily.  07/22/15  Yes Vipul Manuella Ghazi, MD  levothyroxine (SYNTHROID, LEVOTHROID) 50 MCG tablet Take 50 mcg by mouth daily before breakfast.   Yes Reynold Bowen, MD  losartan (COZAAR) 50 MG tablet TAKE 1 TABLET BY MOUTH ONCE DAILY. 07/25/16  Yes [name], MD  pantoprazole (PROTONIX) 40 MG tablet Take 1 tablet (40 mg total) by mouth daily. 04/21/16  Yes [name], MD  saccharomyces boulardii (FLORASTOR) 250 MG capsule Take 250 mg by mouth as needed.    Yes Historical Provider, MD  tamsulosin (FLOMAX) 0.4 MG CAPS capsule Take 1 capsule (0.4 mg total) by mouth daily. 11/14/16  Yes Shannon A [name], PA-C  albuterol (PROAIR HFA) 108 (90 Base) MCG/ACT inhaler Inhale 1-2 puffs into the lungs every 6 (six) hours as needed for wheezing or shortness of breath. 07/07/16   [name], MD  fluticasone furoate-vilanterol (BREO ELLIPTA) 100-25 MCG/INH AEPB Inhale 1 puff into the lungs daily. 08/12/16   [name], MD      VITAL SIGNS:  Blood pressure (!) 141/53, pulse 66, temperature 97.7 F (36.5 C), temperature source Oral, resp. rate 16, height '5\' 3"'$  (1.6 m), weight 148 lb (67.1 kg), SpO2 94 %.  PHYSICAL EXAMINATION:  Physical Exam  GENERAL:  83 y.o.-year-old patient lying in the bed with no acute distress.  EYES: Pupils equal, round, reactive to light and accommodation. No scleral icterus. Extraocular muscles intact.  HEENT: Head atraumatic, normocephalic. Oropharynx and nasopharynx clear.  NECK:  Supple, no jugular venous distention. No thyroid enlargement, no tenderness.  LUNGS: Very diminished breath sounds bilaterally, no wheezing, rales,rhonchi or crepitation. No use of accessory muscles of respiration.  CARDIOVASCULAR: S1, S2 normal. No murmurs, rubs, or gallops.  ABDOMEN: Soft, nontender, nondistended. Bowel sounds  present. No organomegaly or mass.  EXTREMITIES: trace bilateral leg edema, no cyanosis, or clubbing. No calf tenderness. NEUROLOGIC: Cranial nerves II through XII are intact. Muscle strength 5/5 in all extremities. Sensation intact. Gait not checked.  PSYCHIATRIC: The patient is alert and oriented x 3.  SKIN: No obvious rash, lesion, or ulcer.   LABORATORY PANEL:   CBC  Recent Labs Lab 11/25/16 1425  WBC 14.2*  HGB 12.8*  HCT 38.8*  PLT 335   ------------------------------------------------------------------------------------------------------------------  Chemistries   Recent Labs Lab 11/25/16 1425  NA 129*  K 4.5  CL 86*  CO2 36*  GLUCOSE 157*  BUN 14  CREATININE 1.16  CALCIUM 9.1  AST 22  ALT 11*  ALKPHOS 60  BILITOT 0.6   ------------------------------------------------------------------------------------------------------------------  Cardiac Enzymes  Recent Labs Lab 11/25/16 1425  TROPONINI <0.03   ------------------------------------------------------------------------------------------------------------------  RADIOLOGY:  Dg Chest 2 View  Result Date: 11/25/2016 CLINICAL DATA:  83 y/o M;  weakness and increasing shortness of breath. EXAM: CHEST  2 VIEW COMPARISON:  07/20/2015 chest radiograph FINDINGS: Normal stable cardiac silhouette. Bowel supra position over liver. Clear lungs. Mild dextrocurvature of thoracic spine and degenerative changes. No pleural effusion or pneumothorax. IMPRESSION: No active cardiopulmonary disease. Electronically Signed   By: Kristine Garbe M.D.   On: 11/25/2016 14:57      IMPRESSION AND PLAN:   Acute respiratory failure with Hypercapnia and hypoxia due to COPD exacerbation The patient will be admitted to medical floor. Start DuoNeb every 6 hours, IV Solu-Medrol and home nebulizer. Follow-up chest CT angiogram.  Acute bronchitis with leukocytosis. Start Zithromax and follow-up CBC.  Hyponatremia. Hold  lasix, follow-up BMP.  Hypertension. Continue hypertension medication. Diabetes. Start sliding scale.   All the records are reviewed and case discussed with ED provider. Management plans discussed with the patient, his wife and they are in agreement.  CODE STATUS: Full code  TOTAL TIME TAKING CARE OF THIS PATIENT: 55 minutes.    Demetrios Loll M.D on 11/25/2016 at 7:27 PM  Between 7am to 6pm - Pager - [phone]  After 6pm go to [email or web address] - Proofreader  [employer] [facility]  Office  [phone]  CC: Primary care physician; [name], MD   Note: This dictation was prepared with Dragon dictation along with smaller phrase technology. Any transcriptional errors that result from this process are unintentional.

## 2016-11-26 DIAGNOSIS — R0602 Shortness of breath: Secondary | ICD-10-CM | POA: Diagnosis not present

## 2016-11-26 DIAGNOSIS — J441 Chronic obstructive pulmonary disease with (acute) exacerbation: Secondary | ICD-10-CM | POA: Diagnosis not present

## 2016-11-26 LAB — GLUCOSE, CAPILLARY
GLUCOSE-CAPILLARY: 158 mg/dL — AB (ref 65–99)
GLUCOSE-CAPILLARY: 165 mg/dL — AB (ref 65–99)
Glucose-Capillary: 189 mg/dL — ABNORMAL HIGH (ref 65–99)
Glucose-Capillary: 287 mg/dL — ABNORMAL HIGH (ref 65–99)

## 2016-11-26 LAB — CBC
HEMATOCRIT: 37 % — AB (ref 40.0–52.0)
HEMOGLOBIN: 12.2 g/dL — AB (ref 13.0–18.0)
MCH: 31 pg (ref 26.0–34.0)
MCHC: 33 g/dL (ref 32.0–36.0)
MCV: 94.1 fL (ref 80.0–100.0)
Platelets: 335 10*3/uL (ref 150–440)
RBC: 3.93 MIL/uL — ABNORMAL LOW (ref 4.40–5.90)
RDW: 13.5 % (ref 11.5–14.5)
WBC: 7 10*3/uL (ref 3.8–10.6)

## 2016-11-26 LAB — BASIC METABOLIC PANEL
ANION GAP: 9 (ref 5–15)
BUN: 17 mg/dL (ref 6–20)
CHLORIDE: 88 mmol/L — AB (ref 101–111)
CO2: 33 mmol/L — ABNORMAL HIGH (ref 22–32)
Calcium: 8.8 mg/dL — ABNORMAL LOW (ref 8.9–10.3)
Creatinine, Ser: 1.02 mg/dL (ref 0.61–1.24)
GFR calc Af Amer: >60 mL/min (ref >60)
GLUCOSE: 195 mg/dL — AB (ref 65–99)
POTASSIUM: 4.3 mmol/L (ref 3.5–5.1)
SODIUM: 130 mmol/L — AB (ref 135–145)

## 2016-11-26 NOTE — Progress Notes (Signed)
[place]                                                                                                                                                                                  Patient Demographics   Aaron Hendrix, is a 83 y.o. male, DOB - [birth year], VFI:[id number]  Admit date - 11/25/2016   Admitting Physician Demetrios Loll, MD  Outpatient Primary MD for the patient is [name], MD   LOS - 1  Subjective: Pt admited with sob and hypercapneia Patient's breathing is improved little sleepy    Review of Systems:   CONSTITUTIONAL: No documented fever. No fatigue, weakness. No weight gain, no weight loss.  EYES: No blurry or double vision.  ENT: No tinnitus. No postnasal drip. No redness of the oropharynx.  RESPIRATORY: No cough, no wheeze, no hemoptysis. positive dyspnea.  CARDIOVASCULAR: No chest pain. No orthopnea. No palpitations. No syncope.  GASTROINTESTINAL: No nausea, no vomiting or diarrhea. No abdominal pain. No melena or hematochezia.  GENITOURINARY: No dysuria or hematuria.  ENDOCRINE: No polyuria or nocturia. No heat or cold intolerance.  HEMATOLOGY: No anemia. No bruising. No bleeding.  INTEGUMENTARY: No rashes. No lesions.  MUSCULOSKELETAL: No arthritis. No swelling. No gout.  NEUROLOGIC: No numbness, tingling, or ataxia. No seizure-type activity.  PSYCHIATRIC: No anxiety. No insomnia. No ADD.    Vitals:   Vitals:   11/26/16 0106 11/26/16 0436 11/26/16 0754 11/26/16 0755  BP:  119/61 (!) 165/93   Pulse:  (!) 114 (!) 115   Resp:  18    Temp:  97.4 F (36.3 C) 97.5 F (36.4 C)   TempSrc:  Oral Oral   SpO2: 91% 94% 92% 95%  Weight:      Height:        Wt Readings from Last 3 Encounters:  11/25/16 143 lb 9.6 oz (65.1 kg)  11/25/16 148 lb 6.4 oz (67.3 kg)  11/14/16 147 lb (66.7 kg)     Intake/Output Summary (Last 24 hours) at 11/26/16 1108 Last data filed at 11/26/16 0900  Gross per 24 hour   Intake              360 ml  Output              300 ml  Net               60 ml    Physical Exam:   GENERAL: Pleasant-appearing in no apparent distress.  HEAD, EYES, EARS, NOSE AND THROAT: Atraumatic, normocephalic. Extraocular muscles are intact. Pupils equal and reactive to light. Sclerae anicteric. No conjunctival injection. No oro-pharyngeal erythema.  NECK: Supple. There  is no jugular venous distention. No bruits, no lymphadenopathy, no thyromegaly.  HEART: Regular rate and rhythm,. No murmurs, no rubs, no clicks.  LUNGS: Decreased breath sounds. No rales or rhonchi. No wheezes.  ABDOMEN: Soft, flat, nontender, nondistended. Has good bowel sounds. No hepatosplenomegaly appreciated.  EXTREMITIES: No evidence of any cyanosis, clubbing, or peripheral edema.  +2 pedal and radial pulses bilaterally.  NEUROLOGIC: The patient is alert, awake, and oriented x3 with no focal motor or sensory deficits appreciated bilaterally.  SKIN: Moist and warm with no rashes appreciated.  Psych: Not anxious, depressed LN: No inguinal LN enlargement    Antibiotics   Anti-infectives    Start     Dose/Rate Route Frequency Ordered Stop   11/26/16 1800  azithromycin (ZITHROMAX) tablet 500 mg     500 mg Oral Daily 11/25/16 2044     11/25/16 1545  azithromycin (ZITHROMAX) tablet 500 mg     500 mg Oral  Once 11/25/16 1541 11/25/16 1702      Medications   Scheduled Meds: . azithromycin  500 mg Oral Daily  . enoxaparin (LOVENOX) injection  40 mg Subcutaneous Q24H  . finasteride  5 mg Oral Daily  . fluticasone furoate-vilanterol  1 puff Inhalation Daily  . insulin aspart  0-5 Units Subcutaneous QHS  . insulin aspart  0-9 Units Subcutaneous TID WC  . ipratropium-albuterol  3 mL Nebulization Q6H  . levothyroxine  50 mcg Oral QAC breakfast  . losartan  50 mg Oral Daily  . methylPREDNISolone (SOLU-MEDROL) injection  40 mg Intravenous Q12H  . pantoprazole  40 mg Oral Daily  . sodium chloride flush  3 mL  Intravenous Q12H  . tamsulosin  0.4 mg Oral Daily   Continuous Infusions: PRN Meds:.sodium chloride, acetaminophen **OR** acetaminophen, albuterol, guaiFENesin-dextromethorphan, ondansetron **OR** ondansetron (ZOFRAN) IV, senna-docusate, sodium chloride flush   Data Review:   Micro Results Recent Results (from the past 240 hour(s))  Blood Culture (routine x 2)     Status: None (Preliminary result)   Collection Time: 11/25/16  5:16 PM  Result Value Ref Range Status   Specimen Description BLOOD RIGHT HAND  Final   Special Requests BOTTLES DRAWN AEROBIC AND ANAEROBIC BCLV  Final   Culture NO GROWTH < 24 HOURS  Final   Report Status PENDING  Incomplete  Blood Culture (routine x 2)     Status: None (Preliminary result)   Collection Time: 11/25/16  5:19 PM  Result Value Ref Range Status   Specimen Description BLOOD LEFT HAND  Final   Special Requests BOTTLES DRAWN AEROBIC AND ANAEROBIC BCLV  Final   Culture NO GROWTH < 24 HOURS  Final   Report Status PENDING  Incomplete    Radiology Reports Dg Chest 2 View  Result Date: 11/25/2016 CLINICAL DATA:  83 y/o M; weakness and increasing shortness of breath. EXAM: CHEST  2 VIEW COMPARISON:  07/20/2015 chest radiograph FINDINGS: Normal stable cardiac silhouette. Bowel supra position over liver. Clear lungs. Mild dextrocurvature of thoracic spine and degenerative changes. No pleural effusion or pneumothorax. IMPRESSION: No active cardiopulmonary disease. Electronically Signed   By: Kristine Garbe M.D.   On: 11/25/2016 14:57   Ct Angio Chest Pe W Or Wo Contrast  Result Date: 11/25/2016 CLINICAL DATA:  Dyspnea on exertion.  Cough without fever. EXAM: CT ANGIOGRAPHY CHEST WITH CONTRAST TECHNIQUE: Multidetector CT imaging of the chest was performed using the standard protocol during bolus administration of intravenous contrast. Multiplanar CT image reconstructions and MIPs were obtained to evaluate the  vascular anatomy. CONTRAST:  07/20/2015 CT  COMPARISON:  None. FINDINGS: Cardiovascular: The left common carotid artery branches off the brachiocephalic trunk, a vascular variant. No aortic aneurysm. Aortic atherosclerosis. There is dilatation of the main pulmonary artery to 3.6 cm consistent with a component of pulmonary hypertension. No acute pulmonary embolus. Top normal size cardiac chambers without pericardial effusion. There is coronary arteriosclerosis. Mediastinum/Nodes: No thyromegaly. No mediastinal nor hilar adenopathy. The trachea and mainstem bronchi are unremarkable. Lungs/Pleura: There is bibasilar dependent atelectasis. Minimal residual atelectasis and/or scarring in the posterior right upper lobe at site of prior ill-defined pulmonary opacity back in 2016. No dominant mass, effusion or pneumothorax. Upper Abdomen: There is colonic interposition. Tiny layering gallstones are partially imaged within the gallbladder. Musculoskeletal: Degenerative changes are seen along the dorsal spine without acute nor worrisome osseous lesions. Review of the MIP images confirms the above findings. IMPRESSION: 1. No acute large central pulmonary embolus. 2. Prominence of the main pulmonary artery up to 3.6 cm in caliber may reflect a component pulmonary hypertension. 3. Bibasilar atelectasis. No acute pneumonic consolidation, effusion or pneumothorax. 4. Uncomplicated cholelithiasis. 5. Colonic interposition over the liver. Electronically Signed   By: Ashley Royalty M.D.   On: 11/25/2016 20:01     CBC  Recent Labs Lab 11/25/16 1425 11/26/16 0417  WBC 14.2* 7.0  HGB 12.8* 12.2*  HCT 38.8* 37.0*  PLT 335 335  MCV 93.3 94.1  MCH 30.8 31.0  MCHC 33.0 33.0  RDW 13.9 13.5  LYMPHSABS 1.5  --   MONOABS 1.1*  --   EOSABS 0.1  --   BASOSABS 0.1  --     Chemistries   Recent Labs Lab 11/25/16 1425 11/26/16 0417  NA 129* 130*  K 4.5 4.3  CL 86* 88*  CO2 36* 33*  GLUCOSE 157* 195*  BUN 14 17  CREATININE 1.16 1.02  CALCIUM 9.1 8.8*  MG 1.5*   --   AST 22  --   ALT 11*  --   ALKPHOS 60  --   BILITOT 0.6  --    ------------------------------------------------------------------------------------------------------------------ estimated creatinine clearance is 43.4 mL/min (by C-G formula based on SCr of 1.02 mg/dL). ------------------------------------------------------------------------------------------------------------------ No results for input(s): HGBA1C in the last 72 hours. ------------------------------------------------------------------------------------------------------------------ No results for input(s): CHOL, HDL, LDLCALC, TRIG, CHOLHDL, LDLDIRECT in the last 72 hours. ------------------------------------------------------------------------------------------------------------------ No results for input(s): TSH, T4TOTAL, T3FREE, THYROIDAB in the last 72 hours.  Invalid input(s): FREET3 ------------------------------------------------------------------------------------------------------------------ No results for input(s): VITAMINB12, FOLATE, FERRITIN, TIBC, IRON, RETICCTPCT in the last 72 hours.  Coagulation profile No results for input(s): INR, PROTIME in the last 168 hours.  No results for input(s): DDIMER in the last 72 hours.  Cardiac Enzymes  Recent Labs Lab 11/25/16 1425  TROPONINI <0.03   ------------------------------------------------------------------------------------------------------------------ Invalid input(s): POCBNP    Assessment & Plan  Patient is a 83 year old with COPD  1. Acute respiratory failure with Hypercapnia and hypoxia due to COPD exacerbation CT scan without evidence of pulmonary embolism continue DuoNeb every 6 hours, IV Solu-Medrol and home nebulizer.  2. Sleep apnea with CO2 retention patient's wife states that he takes it off after taking wearing it for a few hours I encouraged that he wear that at all times, place him back on CPAP tonight  3. Acute bronchitis with  leukocytosis. Continue Zithromax  4. Hyponatremia. Lasix on hold  5. Hypertension. Continue hypertension medication.  6.Diabetes. Continue sliding scale.      Code Status Orders  Start     Ordered   11/25/16 2045  Full code  Continuous     11/25/16 2044    Code Status History    Date Active Date Inactive Code Status Order ID Comments User Context   07/16/2015  3:08 PM 07/22/2015  5:44 PM Full Code [id number]  Hillary Bow, MD ED   06/25/2015  8:14 PM 07/06/2015  5:57 PM Full Code [id number]  Gladstone Lighter, MD Inpatient   11/13/2012  5:41 PM 11/16/2012  1:37 PM Full Code [id number]  Mcarthur Rossetti, MD Inpatient    Advance Directive Documentation   Flowsheet Row Most Recent Value  Type of Advance Directive  Healthcare Power of Attorney, Living will  Pre-existing out of facility DNR order (yellow form or pink MOST form)  No data  "MOST" Form in Place?  No data           ConsultsNone DVT Prophylaxis  Lovenox   Lab Results  Component Value Date   PLT 335 11/26/2016     Time Spent in minutes   32mn  Greater than 50% of time spent in care coordination and counseling patient regarding the condition and plan of care.   PDustin FlockM.D on 11/26/2016 at 11:08 AM  Between 7am to 6pm - Pager - [phone]  After 6pm go to [email or web address] - password [email or web address]   Office  [phone]

## 2016-11-27 DIAGNOSIS — J441 Chronic obstructive pulmonary disease with (acute) exacerbation: Secondary | ICD-10-CM | POA: Diagnosis not present

## 2016-11-27 DIAGNOSIS — R0602 Shortness of breath: Secondary | ICD-10-CM | POA: Diagnosis not present

## 2016-11-27 LAB — HEMOGLOBIN A1C
Hgb A1c MFr Bld: 6 % — ABNORMAL HIGH (ref 4.8–5.6)
MEAN PLASMA GLUCOSE: 126 mg/dL

## 2016-11-27 LAB — BASIC METABOLIC PANEL
Anion gap: 7 (ref 5–15)
BUN: 19 mg/dL (ref 6–20)
CALCIUM: 9 mg/dL (ref 8.9–10.3)
CHLORIDE: 90 mmol/L — AB (ref 101–111)
CO2: 35 mmol/L — ABNORMAL HIGH (ref 22–32)
CREATININE: 0.88 mg/dL (ref 0.61–1.24)
GFR calc Af Amer: >60 mL/min (ref >60)
GFR calc non Af Amer: >60 mL/min (ref >60)
Glucose, Bld: 184 mg/dL — ABNORMAL HIGH (ref 65–99)
Potassium: 5 mmol/L (ref 3.5–5.1)
SODIUM: 132 mmol/L — AB (ref 135–145)

## 2016-11-27 LAB — GLUCOSE, CAPILLARY: GLUCOSE-CAPILLARY: 164 mg/dL — AB (ref 65–99)

## 2016-11-27 MED ORDER — IPRATROPIUM-ALBUTEROL 0.5-2.5 (3) MG/3ML IN SOLN: 3.0000 mL | Freq: Three times a day (TID) | RESPIRATORY_TRACT | 2 refills | Status: DC

## 2016-11-27 MED ORDER — AZITHROMYCIN 500 MG PO TABS: 500.0000 mg | ORAL_TABLET | Freq: Every day | ORAL | 0 refills | Status: AC

## 2016-11-27 MED ORDER — PREDNISONE 10 MG (21) PO TBPK: ORAL_TABLET | ORAL | 0 refills | Status: DC

## 2016-11-27 MED ORDER — IPRATROPIUM-ALBUTEROL 0.5-2.5 (3) MG/3ML IN SOLN
3.0000 mL | Freq: Three times a day (TID) | RESPIRATORY_TRACT | Status: DC
  Administered 2016-11-27: 3 mL via RESPIRATORY_TRACT
  Filled 2016-11-27: qty 3

## 2016-11-27 NOTE — Discharge Summary (Signed)
[place] at [facility], 83 y.o., DOB [birth year], MRN [id number]. Admission date: 11/25/2016 Discharge Date 11/27/2016 Primary MD [name], MD Admitting Physician Demetrios Loll, MD  Admission Diagnosis  Hypercarbia [R06.89] Dyspnea [R06.00] Hypoxia [R09.02] COPD exacerbation ([place]) [J44.1]  Discharge Diagnosis   Active Problems: Acute on chronic COPD exasperation Acute respiratory failure with hypercapnia and hypoxia Sleep apnea Acute bronchitis Hyponatremia Essential hypertension Diabetes        Hospital Course patient is 83 year old male with known history of COPD, hypertension, diabetes, hyperlipidemia, peptic ulcer disease and diabetic neuropathy who presented to the ED with complaint of shortness of breath and worsening cough. Patient's oxygen saturation was noted to be 85%. His chest x-ray was negative he was admitted for acute on chronic COPD exasperation. Patient was treated with nebulizers steroids and antibiotics with significant improvement in his symptoms. Currently he is on room air and doing well. Patient does have sleep apnea and was noted to have CO2 retention I encouraged him to wear his CPAP and be compliant with that.             Consults  None  Significant Tests:  See full reports for all details     Dg Chest 2 View  Result Date: 11/25/2016 CLINICAL DATA:  83 y/o M; weakness and increasing shortness of breath. EXAM: CHEST  2 VIEW COMPARISON:  07/20/2015 chest radiograph FINDINGS: Normal stable cardiac silhouette. Bowel supra position over liver. Clear lungs. Mild dextrocurvature of thoracic spine and degenerative changes. No pleural effusion or pneumothorax. IMPRESSION: No active cardiopulmonary disease. Electronically Signed   By: Kristine Garbe M.D.   On: 11/25/2016 14:57   Ct Angio Chest Pe W Or Wo Contrast  Result Date: 11/25/2016 CLINICAL DATA:  Dyspnea on exertion.  Cough without fever.  EXAM: CT ANGIOGRAPHY CHEST WITH CONTRAST TECHNIQUE: Multidetector CT imaging of the chest was performed using the standard protocol during bolus administration of intravenous contrast. Multiplanar CT image reconstructions and MIPs were obtained to evaluate the vascular anatomy. CONTRAST:  07/20/2015 CT COMPARISON:  None. FINDINGS: Cardiovascular: The left common carotid artery branches off the brachiocephalic trunk, a vascular variant. No aortic aneurysm. Aortic atherosclerosis. There is dilatation of the main pulmonary artery to 3.6 cm consistent with a component of pulmonary hypertension. No acute pulmonary embolus. Top normal size cardiac chambers without pericardial effusion. There is coronary arteriosclerosis. Mediastinum/Nodes: No thyromegaly. No mediastinal nor hilar adenopathy. The trachea and mainstem bronchi are unremarkable. Lungs/Pleura: There is bibasilar dependent atelectasis. Minimal residual atelectasis and/or scarring in the posterior right upper lobe at site of prior ill-defined pulmonary opacity back in 2016. No dominant mass, effusion or pneumothorax. Upper Abdomen: There is colonic interposition. Tiny layering gallstones are partially imaged within the gallbladder. Musculoskeletal: Degenerative changes are seen along the dorsal spine without acute nor worrisome osseous lesions. Review of the MIP images confirms the above findings. IMPRESSION: 1. No acute large central pulmonary embolus. 2. Prominence of the main pulmonary artery up to 3.6 cm in caliber may reflect a component pulmonary hypertension. 3. Bibasilar atelectasis. No acute pneumonic consolidation, effusion or pneumothorax. 4. Uncomplicated cholelithiasis. 5. Colonic interposition over the liver. Electronically Signed   By: Ashley Royalty M.D.   On: 11/25/2016 20:01       Today   Subjective:   Aaron Hendrix breathing much better wants to go home  Objective:   Blood pressure (!) 141/64, pulse 97, temperature 98 F (36.7 C),  temperature source Oral, resp.  rate 17, height '5\' 3"'$  (1.6 m), weight 143 lb 9.6 oz (65.1 kg), SpO2 91 %.  .  Intake/Output Summary (Last 24 hours) at 11/27/16 1242 Last data filed at 11/27/16 0800  Gross per 24 hour  Intake              600 ml  Output                0 ml  Net              600 ml    Exam VITAL SIGNS: Blood pressure (!) 141/64, pulse 97, temperature 98 F (36.7 C), temperature source Oral, resp. rate 17, height '5\' 3"'$  (1.6 m), weight 143 lb 9.6 oz (65.1 kg), SpO2 91 %.  GENERAL:  83 y.o.-year-old patient lying in the bed with no acute distress.  EYES: Pupils equal, round, reactive to light and accommodation. No scleral icterus. Extraocular muscles intact.  HEENT: Head atraumatic, normocephalic. Oropharynx and nasopharynx clear.  NECK:  Supple, no jugular venous distention. No thyroid enlargement, no tenderness.  LUNGS: Normal breath sounds bilaterally, no wheezing, rales,rhonchi or crepitation. No use of accessory muscles of respiration.  CARDIOVASCULAR: S1, S2 normal. No murmurs, rubs, or gallops.  ABDOMEN: Soft, nontender, nondistended. Bowel sounds present. No organomegaly or mass.  EXTREMITIES: No pedal edema, cyanosis, or clubbing.  NEUROLOGIC: Cranial nerves II through XII are intact. Muscle strength 5/5 in all extremities. Sensation intact. Gait not checked.  PSYCHIATRIC: The patient is alert and oriented x 3.  SKIN: No obvious rash, lesion, or ulcer.   Data Review     CBC w Diff: Lab Results  Component Value Date   WBC 7.0 11/26/2016   HGB 12.2 (L) 11/26/2016   HGB 13.5 06/03/2014   HCT 37.0 (L) 11/26/2016   HCT 42.2 06/03/2014   PLT 335 11/26/2016   PLT 228 06/03/2014   LYMPHOPCT 11 11/25/2016   LYMPHOPCT 4.3 06/03/2014   MONOPCT 8 11/25/2016   MONOPCT 13.3 06/03/2014   EOSPCT 1 11/25/2016   EOSPCT 0.0 06/03/2014   BASOPCT 1 11/25/2016   BASOPCT 0.2 06/03/2014   CMP: Lab Results  Component Value Date   NA 132 (L) 11/27/2016   NA 132 (L)  06/03/2014   K 5.0 11/27/2016   K 4.6 06/03/2014   CL 90 (L) 11/27/2016   CL 96 (L) 06/03/2014   CO2 35 (H) 11/27/2016   CO2 30 06/03/2014   BUN 19 11/27/2016   BUN 29 (H) 06/03/2014   CREATININE 0.88 11/27/2016   CREATININE 0.84 02/06/2015   PROT 6.9 11/25/2016   PROT 8.1 06/01/2014   ALBUMIN 3.7 11/25/2016   ALBUMIN 4.1 06/01/2014   BILITOT 0.6 11/25/2016   BILITOT 0.7 06/01/2014   ALKPHOS 60 11/25/2016   ALKPHOS 82 06/01/2014   AST 22 11/25/2016   AST 43 (H) 06/01/2014   ALT 11 (L) 11/25/2016   ALT 24 06/01/2014  .  Micro Results Recent Results (from the past 240 hour(s))  Blood Culture (routine x 2)     Status: None (Preliminary result)   Collection Time: 11/25/16  5:16 PM  Result Value Ref Range Status   Specimen Description BLOOD RIGHT HAND  Final   Special Requests BOTTLES DRAWN AEROBIC AND ANAEROBIC BCLV  Final   Culture NO GROWTH 2 DAYS  Final   Report Status PENDING  Incomplete  Blood Culture (routine x 2)     Status: None (Preliminary result)   Collection Time: 11/25/16  5:19 PM  Result Value Ref Range Status   Specimen Description BLOOD LEFT HAND  Final   Special Requests BOTTLES DRAWN AEROBIC AND ANAEROBIC BCLV  Final   Culture NO GROWTH 2 DAYS  Final   Report Status PENDING  Incomplete        Code Status Orders        Start     Ordered   11/25/16 2045  Full code  Continuous     11/25/16 2044    Code Status History    Date Active Date Inactive Code Status Order ID Comments User Context   07/16/2015  3:08 PM 07/22/2015  5:44 PM Full Code [id number]  Hillary Bow, MD ED   06/25/2015  8:14 PM 07/06/2015  5:57 PM Full Code [id number]  Gladstone Lighter, MD Inpatient   11/13/2012  5:41 PM 11/16/2012  1:37 PM Full Code [id number]  Mcarthur Rossetti, MD Inpatient    Advance Directive Documentation   Flowsheet Row Most Recent Value  Type of Advance Directive  Healthcare Power of Attorney, Living will  Pre-existing out of facility DNR order (yellow  form or pink MOST form)  No data  "MOST" Form in Place?  No data          Follow-up Information    SCOTT, Randell Patient, MD Follow up in 1 week(s).   Specialty:  Internal Medicine Contact information: [address] [place] [address] [phone]           Discharge Medications   Allergies as of 11/27/2016      Reactions   Fexofenadine Other (See Comments)   Quinapril Hcl Other (See Comments)   Unknown    Latex Rash      Medication List    TAKE these medications   albuterol 108 (90 Base) MCG/ACT inhaler Commonly known as:  PROAIR HFA Inhale 1-2 puffs into the lungs every 6 (six) hours as needed for wheezing or shortness of breath.   azithromycin 500 MG tablet Commonly known as:  ZITHROMAX Take 1 tablet (500 mg total) by mouth daily.   finasteride 5 MG tablet Commonly known as:  PROSCAR Take 1 tablet (5 mg total) by mouth daily.   fluticasone furoate-vilanterol 100-25 MCG/INH Aepb Commonly known as:  BREO ELLIPTA Inhale 1 puff into the lungs daily.   furosemide 20 MG tablet Commonly known as:  LASIX Take 1 tablet (20 mg total) by mouth daily. What changed:  how much to take   ipratropium-albuterol 0.5-2.5 (3) MG/3ML Soln Commonly known as:  DUONEB Take 3 mLs by nebulization 3 (three) times daily.   levothyroxine 50 MCG tablet Commonly known as:  SYNTHROID, LEVOTHROID Take 50 mcg by mouth daily before breakfast.   losartan 50 MG tablet Commonly known as:  COZAAR TAKE 1 TABLET BY MOUTH ONCE DAILY.   pantoprazole 40 MG tablet Commonly known as:  PROTONIX Take 1 tablet (40 mg total) by mouth daily.   predniSONE 10 MG (21) Tbpk tablet Commonly known as:  STERAPRED UNI-PAK 21 TAB Start at '60mg'$  taper by '10mg'$  until complete   saccharomyces boulardii 250 MG capsule Commonly known as:  FLORASTOR Take 250 mg by mouth as needed.   tamsulosin 0.4 MG Caps capsule Commonly known as:  FLOMAX Take 1 capsule (0.4 mg total) by mouth  daily.          Total Time in preparing paper work, data evaluation and todays exam - 35 minutes  Dustin Flock M.D on 11/27/2016 at 12:42 PM  [facility]  Physicians   Office  [phone]

## 2016-11-27 NOTE — Discharge Instructions (Signed)
[place] at [place]:  Cardiac diet  DISCHARGE CONDITION:  Stable  ACTIVITY:  Activity as tolerated  OXYGEN:  Home Oxygen: No.   Oxygen Delivery: room air  DISCHARGE LOCATION:  home    ADDITIONAL DISCHARGE INSTRUCTION: must wear cpap every time sleeping   If you experience worsening of your admission symptoms, develop shortness of breath, life threatening emergency, suicidal or homicidal thoughts you must seek medical attention immediately by calling 911 or calling your MD immediately  if symptoms less severe.  You Must read complete instructions/literature along with all the possible adverse reactions/side effects for all the Medicines you take and that have been prescribed to you. Take any new Medicines after you have completely understood and accpet all the possible adverse reactions/side effects.   Please note  You were cared for by a hospitalist during your hospital stay. If you have any questions about your discharge medications or the care you received while you were in the hospital after you are discharged, you can call the unit and asked to speak with the hospitalist on call if the hospitalist that took care of you is not available. Once you are discharged, your primary care physician will handle any further medical issues. Please note that NO REFILLS for any discharge medications will be authorized once you are discharged, as it is imperative that you return to your primary care physician (or establish a relationship with a primary care physician if you do not have one) for your aftercare needs so that they can reassess your need for medications and monitor your lab values.

## 2016-11-27 NOTE — Progress Notes (Signed)
Pt is being discharge home. Discharge papers given and explained to pt and family. Pt and family verbalized understanding. F/U appointments and meds reviewed with pt and family.

## 2016-11-29 ENCOUNTER — Telehealth: Payer: Self-pay | Admitting: Internal Medicine

## 2016-11-29 NOTE — Telephone Encounter (Signed)
Patient has an appointment on 12/02/16 for CPE but needs to FU on hospital stay for hypoxia ,COPD Exacerbation. ok same appointment?

## 2016-11-29 NOTE — Telephone Encounter (Signed)
Can use this appt, but would recommend doing hospital f/u and holding on CPE.  Thanks

## 2016-11-29 NOTE — Telephone Encounter (Signed)
Transition Care Management Follow-up Telephone Call  How have you been since you were released from the hospital? Patient has been doing real well according to wife.   Do you understand why you were in the hospital? yes   Do you understand the discharge instrcutions?Yes  Items Reviewed:  Medications reviewed:Yes, wife gives medications and voices understanding.  Allergies reviewed: Yes  Dietary changes reviewed: yes,  Referrals reviewed: yes   Functional Questionnaire:   Activities of Daily Living (ADLs):   He states they are independent in the following:Independant in al ADL's States they require assistance with the following: Requires assist only with medication .   Any transportation issues/concerns?:No   Any patient concerns? No   Confirmed importance and date/time of follow-up visits scheduled: Yes   Confirmed with patient if condition begins to worsen call PCP or go to the ER.  Patient was given the [place] line [phone]. Yes

## 2016-11-30 LAB — CULTURE, BLOOD (ROUTINE X 2)
CULTURE: NO GROWTH
Culture: NO GROWTH

## 2016-11-30 NOTE — Telephone Encounter (Signed)
Appointment changed to [place].

## 2016-12-02 ENCOUNTER — Ambulatory Visit (INDEPENDENT_AMBULATORY_CARE_PROVIDER_SITE_OTHER): Payer: Medicare Other | Admitting: Internal Medicine

## 2016-12-02 ENCOUNTER — Other Ambulatory Visit: Payer: Self-pay | Admitting: *Deleted

## 2016-12-02 ENCOUNTER — Encounter: Payer: Self-pay | Admitting: Internal Medicine

## 2016-12-02 VITALS — BP 110/68 | HR 99 | Temp 98.4°F | Resp 16 | Ht 63.0 in | Wt 153.6 lb

## 2016-12-02 DIAGNOSIS — R600 Localized edema: Secondary | ICD-10-CM | POA: Diagnosis not present

## 2016-12-02 DIAGNOSIS — J441 Chronic obstructive pulmonary disease with (acute) exacerbation: Secondary | ICD-10-CM

## 2016-12-02 DIAGNOSIS — E119 Type 2 diabetes mellitus without complications: Secondary | ICD-10-CM | POA: Diagnosis not present

## 2016-12-02 DIAGNOSIS — E871 Hypo-osmolality and hyponatremia: Secondary | ICD-10-CM

## 2016-12-02 DIAGNOSIS — E039 Hypothyroidism, unspecified: Secondary | ICD-10-CM | POA: Diagnosis not present

## 2016-12-02 DIAGNOSIS — R0902 Hypoxemia: Secondary | ICD-10-CM | POA: Diagnosis not present

## 2016-12-02 DIAGNOSIS — I10 Essential (primary) hypertension: Secondary | ICD-10-CM

## 2016-12-02 LAB — SODIUM: Sodium: 136 mEq/L (ref 135–145)

## 2016-12-02 MED ORDER — FLUTICASONE FUROATE-VILANTEROL 100-25 MCG/INH IN AEPB: 1.0000 | INHALATION_SPRAY | Freq: Every day | RESPIRATORY_TRACT | 11 refills | Status: DC

## 2016-12-02 MED ORDER — LOSARTAN POTASSIUM 50 MG PO TABS: 50.0000 mg | ORAL_TABLET | Freq: Every day | ORAL | 1 refills | Status: DC

## 2016-12-02 MED ORDER — PANTOPRAZOLE SODIUM 40 MG PO TBEC: 40.0000 mg | DELAYED_RELEASE_TABLET | Freq: Every day | ORAL | 1 refills | Status: DC

## 2016-12-02 MED ORDER — LEVOTHYROXINE SODIUM 50 MCG PO TABS: 50.0000 ug | ORAL_TABLET | Freq: Every day | ORAL | 1 refills | Status: AC

## 2016-12-02 NOTE — Progress Notes (Signed)
Pre-visit discussion using our clinic review tool. No additional management support is needed unless otherwise documented below in the visit note.  

## 2016-12-02 NOTE — Progress Notes (Signed)
[name], male   DOB: 06-15-1930, 83 y.o.   MRN: [id number]   Subjective:    [name], male    DOB: 1930/08/26, 83 y.o.   MRN: [id number]  HPI  Patient here for hospital follow up. Was admitted with hypoxia.  Diagnosed with acute on chronic COPD exacerbation.  Treated with nebuizers, steroids and abx.  Since discharge he feels better.  Breathing is better.  Finished prednisone and abx this am.  States still feels some hoarseness at times.  No acid reflux.  No abdominal pain or cramping.  Bowels stable.     Past Medical History:  Diagnosis Date  . Carpal tunnel syndrome   . COPD with asthma ([place])   . Diabetes mellitus    Diet control   . DJD (degenerative joint disease), cervical   . Duodenal ulcer, with partial obstruction 08/10/2012  . Essential and other specified forms of tremor 12/16/2013  . Hyperlipidemia   . Hypertension    no medicine needed  . IBS (irritable bowel syndrome)   . Internal hemorrhoids   . Memory deficit 12/16/2013  . Numbness and tingling in right hand    started 2 yeas ago  . Peptic ulcer   . Personal history of colonic polyps    adenomatous  . Polyneuropathy in diabetes(357.2)   . Tremor, essential   . Vitamin D deficiency    Past Surgical History:  Procedure Laterality Date  . ANAL FISSURE REPAIR    . ANKLE SURGERY Right    right- pins placed in  . COLONOSCOPY W/ BIOPSIES AND POLYPECTOMY  8/03, 6/05, 7/09, 9/10   internal hemorrhoids, tubular adenomas, mucosa & lymphoid nodules  . TOTAL HIP ARTHROPLASTY Right 11/13/2012   Procedure: TOTAL HIP ARTHROPLASTY ANTERIOR APPROACH;  Surgeon: Mcarthur Rossetti, MD;  Location: [facility];  Service: Orthopedics;  Laterality: Right;  Right total hip arthroplasty  . UPPER GASTROINTESTINAL ENDOSCOPY  3/05, 7/09, 9/10,2013   gastritis, duodenitis   Family History  Problem Relation Age of Onset  . Thyroid cancer Daughter 18  . Diabetes Son   . Urolithiasis Son   .  Leukemia Maternal Aunt   . Colon cancer Neg Hx   . Esophageal cancer Neg Hx   . Rectal cancer Neg Hx   . Stomach cancer Neg Hx   . Prostate cancer Neg Hx   . Kidney disease Neg Hx   . Kidney cancer Neg Hx    Social History   Social History  . Marital status: Married    Spouse name: N/A  . Number of children: 3  . Years of education: MA   Occupational History  . 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 Smoking status: Former Smoker    Types: Cigarettes    Quit date: 07/02/1963  . Smokeless tobacco: Never Used  . Alcohol use No  . Drug use: No  . Sexual activity: Not Asked   Other Topics Concern  . None   Social History Narrative   Veteran [employer]    Outpatient Encounter Prescriptions as of 12/02/2016  Medication Sig  . albuterol (PROAIR HFA) 108 (90 Base) MCG/ACT inhaler Inhale 1-2 puffs into the lungs every 6 (six) hours as needed for wheezing or shortness of breath.  . finasteride (PROSCAR) 5 MG tablet Take 1 tablet (5 mg total) by mouth daily.  . furosemide (LASIX) 20 MG tablet Take 1 tablet (20 mg total)  by mouth daily. (Patient taking differently: Take 40 mg by mouth daily. )  . ipratropium-albuterol (DUONEB) 0.5-2.5 (3) MG/3ML SOLN Take 3 mLs by nebulization 3 (three) times daily.  Marland Kitchen levothyroxine (SYNTHROID, LEVOTHROID) 50 MCG tablet Take 1 tablet (50 mcg total) by mouth daily before breakfast.  . losartan (COZAAR) 50 MG tablet Take 1 tablet (50 mg total) by mouth daily.  . pantoprazole (PROTONIX) 40 MG tablet Take 1 tablet (40 mg total) by mouth daily.  Marland Kitchen saccharomyces boulardii (FLORASTOR) 250 MG capsule Take 250 mg by mouth as needed.   . tamsulosin (FLOMAX) 0.4 MG CAPS capsule Take 1 capsule (0.4 mg total) by mouth daily.  . [DISCONTINUED] fluticasone furoate-vilanterol (BREO ELLIPTA) 100-25 MCG/INH AEPB Inhale 1 puff into the lungs daily.  . [DISCONTINUED] levothyroxine (SYNTHROID, LEVOTHROID) 50 MCG tablet Take 50 mcg by mouth daily  before breakfast.  . [DISCONTINUED] losartan (COZAAR) 50 MG tablet TAKE 1 TABLET BY MOUTH ONCE DAILY.  . [DISCONTINUED] pantoprazole (PROTONIX) 40 MG tablet Take 1 tablet (40 mg total) by mouth daily.  . [DISCONTINUED] predniSONE (STERAPRED UNI-PAK 21 TAB) 10 MG (21) TBPK tablet Start at '60mg'$  taper by '10mg'$  until complete (Patient not taking: Reported on 12/02/2016)   No facility-administered encounter medications on file as of 12/02/2016.     Review of Systems  Constitutional: Negative for appetite change and unexpected weight change.  HENT: Negative for congestion and sinus pressure.   Respiratory: Negative for cough, chest tightness and wheezing.   Cardiovascular: Negative for chest pain and palpitations.  Gastrointestinal: Negative for abdominal pain, diarrhea, nausea and vomiting.  Genitourinary: Negative for difficulty urinating and dysuria.  Musculoskeletal: Negative for back pain and joint swelling.  Skin: Negative for color change and rash.  Neurological: Negative for dizziness, light-headedness and headaches.  Psychiatric/Behavioral: Negative for agitation and dysphoric mood.       Objective:    Physical Exam  Constitutional: He appears well-developed and well-nourished. No distress.  HENT:  Nose: Nose normal.  Mouth/Throat: Oropharynx is clear and moist.  Neck: Neck supple. No thyromegaly present.  Cardiovascular: Normal rate and regular rhythm.   Pulmonary/Chest: Effort normal and breath sounds normal. No respiratory distress.  Abdominal: Soft. Bowel sounds are normal. There is no tenderness.  Musculoskeletal: He exhibits no tenderness.  No increased edema.   Lymphadenopathy:    He has no cervical adenopathy.  Skin: No rash noted. No erythema.  Psychiatric: He has a normal mood and affect. His behavior is normal.    BP 110/68 (BP Location: Left Arm, Patient Position: Sitting, Cuff Size: Normal)   Pulse 99   Temp 98.4 F (36.9 C) (Oral)   Resp 16   Ht '5\' 3"'$  (1.6 m)    Wt 153 lb 9.6 oz (69.7 kg)   SpO2 97%   BMI 27.21 kg/m  Wt Readings from Last 3 Encounters:  12/02/16 153 lb 9.6 oz (69.7 kg)  11/25/16 143 lb 9.6 oz (65.1 kg)  11/25/16 148 lb 6.4 oz (67.3 kg)     Lab Results  Component Value Date   WBC 7.0 11/26/2016   HGB 12.2 (L) 11/26/2016   HCT 37.0 (L) 11/26/2016   PLT 335 11/26/2016   GLUCOSE 184 (H) 11/27/2016   CHOL 223 (H) 06/24/2015   TRIG 53.0 06/24/2015   HDL 96.10 06/24/2015   LDLCALC 116 (H) 06/24/2015   ALT 11 (L) 11/25/2016   AST 22 11/25/2016   NA 136 12/02/2016   K 5.0 11/27/2016   CL 90 (L)  11/27/2016   CREATININE 0.88 11/27/2016   BUN 19 11/27/2016   CO2 35 (H) 11/27/2016   TSH 1.38 04/21/2016   PSA 0.83 02/06/2015   INR 0.94 11/06/2012   HGBA1C 6.0 (H) 11/26/2016   MICROALBUR 93.8 (H) 06/24/2015    Dg Chest 2 View  Result Date: 11/25/2016 CLINICAL DATA:  83 y/o M; weakness and increasing shortness of breath. EXAM: CHEST  2 VIEW COMPARISON:  07/20/2015 chest radiograph FINDINGS: Normal stable cardiac silhouette. Bowel supra position over liver. Clear lungs. Mild dextrocurvature of thoracic spine and degenerative changes. No pleural effusion or pneumothorax. IMPRESSION: No active cardiopulmonary disease. Electronically Signed   By: Kristine Garbe M.D.   On: 11/25/2016 14:57   Ct Angio Chest Pe W Or Wo Contrast  Result Date: 11/25/2016 CLINICAL DATA:  Dyspnea on exertion.  Cough without fever. EXAM: CT ANGIOGRAPHY CHEST WITH CONTRAST TECHNIQUE: Multidetector CT imaging of the chest was performed using the standard protocol during bolus administration of intravenous contrast. Multiplanar CT image reconstructions and MIPs were obtained to evaluate the vascular anatomy. CONTRAST:  07/20/2015 CT COMPARISON:  None. FINDINGS: Cardiovascular: The left common carotid artery branches off the brachiocephalic trunk, a vascular variant. No aortic aneurysm. Aortic atherosclerosis. There is dilatation of the main pulmonary  artery to 3.6 cm consistent with a component of pulmonary hypertension. No acute pulmonary embolus. Top normal size cardiac chambers without pericardial effusion. There is coronary arteriosclerosis. Mediastinum/Nodes: No thyromegaly. No mediastinal nor hilar adenopathy. The trachea and mainstem bronchi are unremarkable. Lungs/Pleura: There is bibasilar dependent atelectasis. Minimal residual atelectasis and/or scarring in the posterior right upper lobe at site of prior ill-defined pulmonary opacity back in 2016. No dominant mass, effusion or pneumothorax. Upper Abdomen: There is colonic interposition. Tiny layering gallstones are partially imaged within the gallbladder. Musculoskeletal: Degenerative changes are seen along the dorsal spine without acute nor worrisome osseous lesions. Review of the MIP images confirms the above findings. IMPRESSION: 1. No acute large central pulmonary embolus. 2. Prominence of the main pulmonary artery up to 3.6 cm in caliber may reflect a component pulmonary hypertension. 3. Bibasilar atelectasis. No acute pneumonic consolidation, effusion or pneumothorax. 4. Uncomplicated cholelithiasis. 5. Colonic interposition over the liver. Electronically Signed   By: Ashley Royalty M.D.   On: 11/25/2016 20:01       Assessment & Plan:   Problem List Items Addressed This Visit    COPD (chronic obstructive pulmonary disease) ([place])    Has seen Dr Alva Garnet.  Recent admission for hypoxia.  Treated with nebs, prednisone taper and abx.  Breathing is some better.  CT as outlined.  Refer back to Dr Alva Garnet for f/u and further evaluation and treatment recs.       Diabetes mellitus, type II ([place])    Sugars stable.  Follow.       Relevant Medications   losartan (COZAAR) 50 MG tablet   Hypertension    Blood pressure under good control.  Continue same medication regimen.  Follow pressures.  Follow metabolic panel.        Relevant Medications   losartan (COZAAR) 50 MG tablet   Hyponatremia -  Primary    Sodium level decreased during admission.  Prior to discharge, sodium level improved.  Recheck sodium today.       Relevant Orders   Sodium (Completed)   Hypothyroidism    On thyroid replacement.  Follow tsh.       Relevant Medications   levothyroxine (SYNTHROID, LEVOTHROID) 50 MCG tablet  Hypoxia    Admitted with hypoxia.  Was treated with abx, prednisone and nebs.  Breathing is better now.  Feels better.  CT as outlined.  Has seen pulmonary.  Has not f/u planned.  Refer back to Dr Alva Garnet for further evaluation and treatment recommendations.        Lower extremity edema    Improved.  Follow.           [name], MD

## 2016-12-03 ENCOUNTER — Encounter: Payer: Self-pay | Admitting: Internal Medicine

## 2016-12-03 NOTE — Assessment & Plan Note (Signed)
Blood pressure under good control.  Continue same medication regimen.  Follow pressures.  Follow metabolic panel.   

## 2016-12-03 NOTE — Assessment & Plan Note (Signed)
On thyroid replacement.  Follow tsh.  

## 2016-12-03 NOTE — Assessment & Plan Note (Signed)
Admitted with hypoxia.  Was treated with abx, prednisone and nebs.  Breathing is better now.  Feels better.  CT as outlined.  Has seen pulmonary.  Has not f/u planned.  Refer back to Dr Alva Garnet for further evaluation and treatment recommendations.

## 2016-12-03 NOTE — Assessment & Plan Note (Signed)
Has seen Dr Alva Garnet.  Recent admission for hypoxia.  Treated with nebs, prednisone taper and abx.  Breathing is some better.  CT as outlined.  Refer back to Dr Alva Garnet for f/u and further evaluation and treatment recs.

## 2016-12-03 NOTE — Assessment & Plan Note (Signed)
Sodium level decreased during admission.  Prior to discharge, sodium level improved.  Recheck sodium today.

## 2016-12-03 NOTE — Assessment & Plan Note (Signed)
Improved.  Follow.  

## 2016-12-03 NOTE — Assessment & Plan Note (Signed)
Sugars stable.  Follow.

## 2016-12-08 ENCOUNTER — Ambulatory Visit: Payer: Medicare Other | Admitting: Podiatry

## 2016-12-19 ENCOUNTER — Encounter: Payer: Self-pay | Admitting: Podiatry

## 2016-12-19 ENCOUNTER — Ambulatory Visit (INDEPENDENT_AMBULATORY_CARE_PROVIDER_SITE_OTHER): Payer: Medicare Other | Admitting: Podiatry

## 2016-12-19 DIAGNOSIS — B351 Tinea unguium: Secondary | ICD-10-CM | POA: Diagnosis not present

## 2016-12-19 DIAGNOSIS — M79609 Pain in unspecified limb: Secondary | ICD-10-CM | POA: Diagnosis not present

## 2016-12-19 LAB — HM DIABETES FOOT EXAM

## 2016-12-19 NOTE — Progress Notes (Signed)
Complaint:  Visit Type: Patient returns to my office for continued preventative foot care services. Complaint: Patient states" my nails have grown long and thick and become painful to walk and wear shoes" . The patient presents for preventative foot care services. No changes to ROS  Podiatric Exam: Vascular: dorsalis pedis and posterior tibial pulses are palpable bilateral. Capillary return is immediate. Temperature gradient is WNL. Skin turgor WNL  Sensorium: Normal Semmes Weinstein monofilament test. Normal tactile sensation bilaterally. Nail Exam: Pt has thick disfigured discolored nails with subungual debris noted bilateral entire nail hallux through fifth toenails Ulcer Exam: There is no evidence of ulcer or pre-ulcerative changes or infection. Orthopedic Exam: Muscle tone and strength are WNL. No limitations in general ROM. No crepitus or effusions noted. Foot type and digits show no abnormalities. Bony prominences are unremarkable. Skin: No Porokeratosis. No infection or ulcers  Diagnosis:  Onychomycosis, , Pain in right toe, pain in left toes  Treatment & Plan Procedures and Treatment: Consent by patient was obtained for treatment procedures. The patient understood the discussion of treatment and procedures well. All questions were answered thoroughly reviewed. Debridement of mycotic and hypertrophic toenails, 1 through 5 bilateral and clearing of subungual debris. No ulceration, no infection noted.  Return Visit-Office Procedure: Patient instructed to return to the office for a follow up visit 3 months   for continued evaluation and treatment.    Cohl Behrens DPM 

## 2016-12-20 ENCOUNTER — Encounter: Payer: Self-pay | Admitting: Internal Medicine

## 2016-12-23 ENCOUNTER — Encounter: Payer: Self-pay | Admitting: *Deleted

## 2016-12-23 ENCOUNTER — Emergency Department: Payer: Medicare Other

## 2016-12-23 ENCOUNTER — Emergency Department
Admission: EM | Admit: 2016-12-23 | Discharge: 2016-12-23 | Disposition: A | Discharge status: Home / Self Care | Payer: Medicare Other | Attending: Emergency Medicine | Admitting: Emergency Medicine

## 2016-12-23 DIAGNOSIS — R1011 Right upper quadrant pain: Secondary | ICD-10-CM

## 2016-12-23 DIAGNOSIS — Z87891 Personal history of nicotine dependence: Secondary | ICD-10-CM | POA: Diagnosis not present

## 2016-12-23 DIAGNOSIS — E119 Type 2 diabetes mellitus without complications: Secondary | ICD-10-CM | POA: Diagnosis not present

## 2016-12-23 DIAGNOSIS — I10 Essential (primary) hypertension: Secondary | ICD-10-CM | POA: Insufficient documentation

## 2016-12-23 DIAGNOSIS — J449 Chronic obstructive pulmonary disease, unspecified: Secondary | ICD-10-CM | POA: Insufficient documentation

## 2016-12-23 DIAGNOSIS — K8020 Calculus of gallbladder without cholecystitis without obstruction: Secondary | ICD-10-CM | POA: Diagnosis not present

## 2016-12-23 LAB — COMPREHENSIVE METABOLIC PANEL
ALK PHOS: 58 U/L (ref 38–126)
ALT: 8 U/L — AB (ref 17–63)
AST: 19 U/L (ref 15–41)
Albumin: 3.6 g/dL (ref 3.5–5.0)
Anion gap: 5 (ref 5–15)
BUN: 19 mg/dL (ref 6–20)
CALCIUM: 9.1 mg/dL (ref 8.9–10.3)
CO2: 38 mmol/L — AB (ref 22–32)
Chloride: 91 mmol/L — ABNORMAL LOW (ref 101–111)
Creatinine, Ser: 1.1 mg/dL (ref 0.61–1.24)
GFR calc non Af Amer: 60 mL/min — ABNORMAL LOW (ref >60)
Glucose, Bld: 139 mg/dL — ABNORMAL HIGH (ref 65–99)
Potassium: 4.2 mmol/L (ref 3.5–5.1)
Sodium: 134 mmol/L — ABNORMAL LOW (ref 135–145)
Total Bilirubin: 0.4 mg/dL (ref 0.3–1.2)
Total Protein: 7 g/dL (ref 6.5–8.1)

## 2016-12-23 LAB — TROPONIN I

## 2016-12-23 LAB — CBC
HCT: 35.5 % — ABNORMAL LOW (ref 40.0–52.0)
Hemoglobin: 11.7 g/dL — ABNORMAL LOW (ref 13.0–18.0)
MCH: 30.9 pg (ref 26.0–34.0)
MCHC: 32.9 g/dL (ref 32.0–36.0)
MCV: 93.9 fL (ref 80.0–100.0)
Platelets: 339 10*3/uL (ref 150–440)
RBC: 3.78 MIL/uL — AB (ref 4.40–5.90)
RDW: 13.7 % (ref 11.5–14.5)
WBC: 11 10*3/uL — ABNORMAL HIGH (ref 3.8–10.6)

## 2016-12-23 LAB — LIPASE, BLOOD: Lipase: 20 U/L (ref 11–51)

## 2016-12-23 NOTE — ED Triage Notes (Signed)
Pt presents w/ several hour hx of abdominal pain.

## 2016-12-23 NOTE — ED Provider Notes (Signed)
[facility] Emergency Department Provider Note    First MD Initiated Contact with Patient 12/23/16 [phone]     (approximate)  I have reviewed the triage vital signs and the nursing notes.   HISTORY  Chief Complaint Abdominal Pain    HPI Aaron Hendrix is a 83 y.o. male with blow list of chronic medical conditions presents to the emergency department with upper abdominal discomfort with onset tonight. Patient states pain was relieved with movement however recurred. Patient denies any nausea vomiting diarrhea fever. Patient denies any urinary symptoms. Patient denies any chest discomfort or shortness of breath.   Past Medical History:  Diagnosis Date  . Carpal tunnel syndrome   . COPD with asthma ([place])   . Diabetes mellitus    Diet control   . DJD (degenerative joint disease), cervical   . Duodenal ulcer, with partial obstruction 08/10/2012  . Essential and other specified forms of tremor 12/16/2013  . Hyperlipidemia   . Hypertension    no medicine needed  . IBS (irritable bowel syndrome)   . Internal hemorrhoids   . Memory deficit 12/16/2013  . Numbness and tingling in right hand    started 2 yeas ago  . Peptic ulcer   . Personal history of colonic polyps    adenomatous  . Polyneuropathy in diabetes(357.2)   . Tremor, essential   . Vitamin D deficiency     Patient Active Problem List   Diagnosis Date Noted  . Hypoxia 11/25/2016  . COPD exacerbation ([place]) 11/25/2016  . Rash 01/18/2016  . Nocturia 08/11/2015  . BPH with obstruction/lower urinary tract symptoms 08/11/2015  . Hematemesis 07/29/2015  . Paralytic ileus ([place]) 07/29/2015  . Lower extremity edema 07/16/2015  . Increased urinary frequency 07/16/2015  . COPD (chronic obstructive pulmonary disease) ([place]) 07/16/2015  . Malnutrition of moderate degree ([place]) 07/02/2015  . Community acquired pneumonia   . Hyponatremia 06/25/2015  . Skin lesion 02/16/2015  . Health care maintenance  02/16/2015  . Elbow abrasion 09/27/2014  . Hypothyroidism 09/27/2014  . Chronic constipation 09/01/2014  . Trigger thumb 06/22/2014  . Abdominal pain 06/22/2014  . Personal history of other specified conditions 02/20/2014  . Decreased anal sphincter tone 02/19/2014  . Other specified symptoms and signs involving the digestive system and abdomen 02/19/2014  . Diabetes mellitus ([place]) 02/19/2014  . Farts 02/19/2014  . Memory deficit 12/16/2013  . Essential and other specified forms of tremor 12/16/2013  . Amnesia 12/16/2013  . Diarrhea 10/24/2013  . Loss of weight 09/10/2013  . Periumbilical abdominal pain 09/10/2013  . Pain in lower limb 07/01/2013  . Dermatophytosis of foot 07/01/2013  . Degenerative arthritis of hip 11/13/2012  . Osteoarthritis of hip 11/13/2012  . DU (duodenal ulcer) 08/22/2012  . Nausea 08/10/2012  . Hypertension 05/25/2011  . Diabetes mellitus, type II ([place]) 05/25/2011  . Irritable bowel syndrome 03/31/2008  . History of colonic polyps 03/31/2008  . Adaptive colitis 03/31/2008    Past Surgical History:  Procedure Laterality Date  . ANAL FISSURE REPAIR    . ANKLE SURGERY Right    right- pins placed in  . COLONOSCOPY W/ BIOPSIES AND POLYPECTOMY  8/03, 6/05, 7/09, 9/10   internal hemorrhoids, tubular adenomas, mucosa & lymphoid nodules  . TOTAL HIP ARTHROPLASTY Right 11/13/2012   Procedure: TOTAL HIP ARTHROPLASTY ANTERIOR APPROACH;  Surgeon: Mcarthur Rossetti, MD;  Location: [facility];  Service: Orthopedics;  Laterality: Right;  Right total hip arthroplasty  . UPPER GASTROINTESTINAL ENDOSCOPY  3/05,  7/09, 9/10,2013   gastritis, duodenitis    Prior to Admission medications   Medication Sig Start Date End Date Taking? Authorizing Provider  albuterol (PROAIR HFA) 108 (90 Base) MCG/ACT inhaler Inhale 1-2 puffs into the lungs every 6 (six) hours as needed for wheezing or shortness of breath. 07/07/16   [name], MD  finasteride (PROSCAR) 5 MG tablet  Take 1 tablet (5 mg total) by mouth daily. 11/14/16   [name] [name], PA-C  fluticasone furoate-vilanterol (BREO ELLIPTA) 100-25 MCG/INH AEPB Inhale 1 puff into the lungs daily. 12/02/16   [name], MD  furosemide (LASIX) 20 MG tablet Take 1 tablet (20 mg total) by mouth daily. Patient taking differently: Take 40 mg by mouth daily.  07/22/15   Max Sane, MD  ipratropium-albuterol (DUONEB) 0.5-2.5 (3) MG/3ML SOLN Take 3 mLs by nebulization 3 (three) times daily. 11/27/16   Dustin Flock, MD  levothyroxine (SYNTHROID, LEVOTHROID) 50 MCG tablet Take 1 tablet (50 mcg total) by mouth daily before breakfast. 12/02/16   [name], MD  losartan (COZAAR) 50 MG tablet Take 1 tablet (50 mg total) by mouth daily. 12/02/16   [name], MD  pantoprazole (PROTONIX) 40 MG tablet Take 1 tablet (40 mg total) by mouth daily. 12/02/16   [name], MD  saccharomyces boulardii (FLORASTOR) 250 MG capsule Take 250 mg by mouth as needed.     Historical Provider, MD  tamsulosin (FLOMAX) 0.4 MG CAPS capsule Take 1 capsule (0.4 mg total) by mouth daily. 11/14/16   Nori Riis, PA-C    Allergies Fexofenadine; Quinapril hcl; and Latex  Family History  Problem Relation Age of Onset  . Thyroid cancer Daughter 109  . Diabetes Son   . Urolithiasis Son   . Leukemia Maternal Aunt   . Colon cancer Neg Hx   . Esophageal cancer Neg Hx   . Rectal cancer Neg Hx   . Stomach cancer Neg Hx   . Prostate cancer Neg Hx   . Kidney disease Neg Hx   . Kidney cancer Neg Hx     Social History Social History  Substance Use Topics  . Smoking status: Former Smoker    Types: Cigarettes    Quit date: 07/02/1963  . Smokeless tobacco: Never Used  . Alcohol use No    Review of Systems Constitutional: No fever/chills Eyes: No visual changes. ENT: No sore throat. Cardiovascular: Denies chest pain. Respiratory: Denies shortness of breath. Gastrointestinal: Positive for abdominal pain.  No nausea, no vomiting.  No  diarrhea.  No constipation. Genitourinary: Negative for dysuria. Musculoskeletal: Negative for back pain. Skin: Negative for rash. Neurological: Negative for headaches, focal weakness or numbness.  10-point ROS otherwise negative.  ____________________________________________   PHYSICAL EXAM:  VITAL SIGNS: ED Triage Vitals  Enc Vitals Group     BP 12/23/16 0020 121/63     Pulse Rate 12/23/16 0020 77     Resp 12/23/16 0020 12     Temp 12/23/16 0020 98 F (36.7 C)     Temp Source 12/23/16 0020 Oral     SpO2 12/23/16 0020 100 %     Weight 12/23/16 0011 153 lb (69.4 kg)     Height 12/23/16 0011 '5\' 3"'$  (1.6 m)     Head Circumference --      Peak Flow --      Pain Score 12/23/16 0008 3     Pain Loc --      Pain Edu? --  Excl. in [place]? --     Constitutional: Alert and oriented. Well appearing and in no acute distress. Eyes: Conjunctivae are normal. PERRL. EOMI. Head: Atraumatic. Mouth/Throat: Mucous membranes are moist.  Oropharynx non-erythematous. Neck: No stridor.   Cardiovascular: Normal rate, regular rhythm. Good peripheral circulation. Grossly normal heart sounds. Respiratory: Normal respiratory effort.  No retractions. Lungs CTAB. Gastrointestinal: Right upper quadrant tenderness to palpation.. No distention.  Musculoskeletal: No lower extremity tenderness nor edema. No gross deformities of extremities. Neurologic:  Normal speech and language. No gross focal neurologic deficits are appreciated.  Skin:  Skin is warm, dry and intact. No rash noted. Psychiatric: Mood and affect are normal. Speech and behavior are normal.  ____________________________________________   LABS (all labs ordered are listed, but only abnormal results are displayed)  Labs Reviewed  COMPREHENSIVE METABOLIC PANEL - Abnormal; Notable for the following:       Result Value   Sodium 134 (*)    Chloride 91 (*)    CO2 38 (*)    Glucose, Bld 139 (*)    ALT 8 (*)    GFR calc non Af Amer 60 (*)     All other components within normal limits  CBC - Abnormal; Notable for the following:    WBC 11.0 (*)    RBC 3.78 (*)    Hemoglobin 11.7 (*)    HCT 35.5 (*)    All other components within normal limits  LIPASE, BLOOD  TROPONIN I  URINALYSIS, COMPLETE (UACMP) WITH MICROSCOPIC   ____________________________________________  EKG  ED ECG REPORT I, [place] Gloriana Piltz, the attending physician, personally viewed and interpreted this ECG.   Date: 12/23/2016  EKG Time: 10:17 PM   Rate: 81  Rhythm: Normal sinus rhythm with right bundle-branch block  Axis: Normal  Intervals: Normal  ST&T Change: None  ____________________________________________  RADIOLOGY I, [place] Laketta Soderberg, personally viewed and evaluated these images (plain radiographs) as part of my medical decision making, as well as reviewing the written report by the radiologist.  Dg Chest Portable 1 View  Result Date: 12/23/2016 CLINICAL DATA:  Acute onset of dyspnea.  Initial encounter. EXAM: PORTABLE CHEST 1 VIEW COMPARISON:  Chest radiograph and CTA of the chest performed 11/25/2016 FINDINGS: The lungs are well-aerated. Mild bibasilar atelectasis is noted. There is no evidence of pleural effusion or pneumothorax. The cardiomediastinal silhouette is within normal limits. No acute osseous abnormalities are seen. IMPRESSION: Mild bibasilar atelectasis noted.  Lungs otherwise clear. Electronically Signed   By: Garald Balding M.D.   On: 12/23/2016 01:11   US Abdomen Limited Ruq  Result Date: 12/23/2016 CLINICAL DATA:  83 y/o  M; right upper quadrant pain. EXAM: US ABDOMEN LIMITED - RIGHT UPPER QUADRANT COMPARISON:  None. FINDINGS: Gallbladder: Multiple mobile gallstones measuring up to 6 mm. Negative sonographic Murphy's sign. No secondary signs of acute cholecystitis. Common bile duct: Diameter: 4 mm Liver: No focal lesion identified. Within normal limits in parenchymal echogenicity. IMPRESSION: Cholelithiasis.  No secondary signs  of acute cholecystitis. Electronically Signed   By: Kristine Garbe M.D.   On: 12/23/2016 01:44     Procedures   ____________________________________________   INITIAL IMPRESSION / ASSESSMENT AND PLAN / ED COURSE  Pertinent labs & imaging results that were available during my care of the patient were reviewed by me and considered in my medical decision making (see chart for details).  Patient has no pain at this time. Ultrasound revealed cholelithiasis. Patient will be referred to Dr. Burt Knack  ____________________________________________  FINAL CLINICAL IMPRESSION(S) / ED DIAGNOSES  Final diagnoses:  RUQ pain  Calculus of gallbladder without cholecystitis without obstruction     MEDICATIONS GIVEN DURING THIS VISIT:  Medications - No data to display   NEW OUTPATIENT MEDICATIONS STARTED DURING THIS VISIT:  Discharge Medication List as of 12/23/2016  2:13 AM      Discharge Medication List as of 12/23/2016  2:13 AM      Discharge Medication List as of 12/23/2016  2:13 AM       Note:  This document was prepared using Dragon voice recognition software and may include unintentional dictation errors.     Gregor Hams, MD 12/23/16 [phone]

## 2016-12-23 NOTE — ED Triage Notes (Signed)
Pt presents to ED 03 via EMS c/o abdominal pain since last night; pt states pain relieved with bowel movement; per EMS pt's VS 135/72, HR 72, RR 17, 94%; pt states pain at 3/10; pt denies nausea, vomiting, diarrhea, fever, or incontinence. Pt is able to speak in complete sentences; pt is awake, alert and oriented x4

## 2016-12-23 NOTE — ED Notes (Signed)

## 2017-01-09 ENCOUNTER — Encounter: Payer: Self-pay | Admitting: Internal Medicine

## 2017-01-09 ENCOUNTER — Ambulatory Visit (INDEPENDENT_AMBULATORY_CARE_PROVIDER_SITE_OTHER): Payer: Medicare Other | Admitting: Internal Medicine

## 2017-01-09 VITALS — BP 134/68 | HR 85 | Temp 98.6°F | Resp 14 | Ht 64.0 in | Wt 147.2 lb

## 2017-01-09 DIAGNOSIS — E119 Type 2 diabetes mellitus without complications: Secondary | ICD-10-CM

## 2017-01-09 DIAGNOSIS — D649 Anemia, unspecified: Secondary | ICD-10-CM

## 2017-01-09 DIAGNOSIS — E871 Hypo-osmolality and hyponatremia: Secondary | ICD-10-CM

## 2017-01-09 DIAGNOSIS — R1084 Generalized abdominal pain: Secondary | ICD-10-CM

## 2017-01-09 DIAGNOSIS — J441 Chronic obstructive pulmonary disease with (acute) exacerbation: Secondary | ICD-10-CM

## 2017-01-09 DIAGNOSIS — I10 Essential (primary) hypertension: Secondary | ICD-10-CM | POA: Diagnosis not present

## 2017-01-09 NOTE — Progress Notes (Signed)
Patient ID: OISIN YOAKUM, male   DOB: Oct 09, 1929, 83 y.o.   MRN: [id number]   Subjective:    Patient ID: MADDEN GARRON, male    DOB: [birth year], 83 y.o.   MRN: [id number]  HPI  Patient here for a scheduled follow up.  Was seen in ER for upper abdominal discomfort.  Denied any nausea or vomiting.  Note reviewed.  Ultrasound revealed cholelithiasis.  Surgery consulted.  Discharged home to f/u as outpatient.  He is accompanied by his wife.  History obtained from both of them.  He has had no further pain.  He is eating.  No nausea or vomiting.  Bowels moving.  Overall feels back to his baseline.  Breathing at baseline.  Sodium slightly decreased in ER. hgb stable.      Past Medical History:  Diagnosis Date  . Carpal tunnel syndrome   . COPD with asthma ([place])   . Diabetes mellitus    Diet control   . DJD (degenerative joint disease), cervical   . Duodenal ulcer, with partial obstruction 08/10/2012  . Essential and other specified forms of tremor 12/16/2013  . Hyperlipidemia   . Hypertension    no medicine needed  . IBS (irritable bowel syndrome)   . Internal hemorrhoids   . Memory deficit 12/16/2013  . Numbness and tingling in right hand    started 2 yeas ago  . Peptic ulcer   . Personal history of colonic polyps    adenomatous  . Polyneuropathy in diabetes(357.2)   . Tremor, essential   . Vitamin D deficiency    Past Surgical History:  Procedure Laterality Date  . ANAL FISSURE REPAIR    . ANKLE SURGERY Right    right- pins placed in  . COLONOSCOPY W/ BIOPSIES AND POLYPECTOMY  8/03, 6/05, 7/09, 9/10   internal hemorrhoids, tubular adenomas, mucosa & lymphoid nodules  . INGUINAL HERNIA REPAIR Right   . TOTAL HIP ARTHROPLASTY Right 11/13/2012   Procedure: TOTAL HIP ARTHROPLASTY ANTERIOR APPROACH;  Surgeon: Mcarthur Rossetti, MD;  Location: [facility];  Service: Orthopedics;  Laterality: Right;  Right total hip arthroplasty  . UPPER GASTROINTESTINAL ENDOSCOPY  3/05, 7/09,  9/10,2013   gastritis, duodenitis   Family History  Problem Relation Age of Onset  . Thyroid cancer Daughter 58  . Diabetes Son   . Urolithiasis Son   . Leukemia Maternal Aunt   . Colon cancer Neg Hx   . Esophageal cancer Neg Hx   . Rectal cancer Neg Hx   . Stomach cancer Neg Hx   . Prostate cancer Neg Hx   . Kidney disease Neg Hx   . Kidney cancer Neg Hx    Social History   Social History  . Marital status: Married    Spouse name: N/A  . Number of children: 3  . Years of education: MA   Occupational History  . 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 Smoking status: Former Smoker    Types: Cigarettes    Quit date: 07/02/1963  . Smokeless tobacco: Never Used  . Alcohol use No  . Drug use: No  . Sexual activity: Not Asked   Other Topics Concern  . None   Social History Narrative   Veteran [employer]    Outpatient Encounter Prescriptions as of 01/09/2017  Medication Sig  . albuterol (PROAIR HFA) 108 (90 Base) MCG/ACT inhaler Inhale 1-2 puffs into the lungs every 6 (six) hours as  needed for wheezing or shortness of breath.  . finasteride (PROSCAR) 5 MG tablet Take 1 tablet (5 mg total) by mouth daily.  . fluticasone furoate-vilanterol (BREO ELLIPTA) 100-25 MCG/INH AEPB Inhale 1 puff into the lungs daily.  . furosemide (LASIX) 20 MG tablet Take 1 tablet (20 mg total) by mouth daily. (Patient taking differently: Take 40 mg by mouth daily. )  . ipratropium-albuterol (DUONEB) 0.5-2.5 (3) MG/3ML SOLN Take 3 mLs by nebulization 3 (three) times daily.  Marland Kitchen levothyroxine (SYNTHROID, LEVOTHROID) 50 MCG tablet Take 1 tablet (50 mcg total) by mouth daily before breakfast.  . losartan (COZAAR) 50 MG tablet Take 1 tablet (50 mg total) by mouth daily.  . pantoprazole (PROTONIX) 40 MG tablet Take 1 tablet (40 mg total) by mouth daily.  Marland Kitchen saccharomyces boulardii (FLORASTOR) 250 MG capsule Take 250 mg by mouth as needed.   . tamsulosin (FLOMAX) 0.4 MG CAPS  capsule Take 1 capsule (0.4 mg total) by mouth daily.   No facility-administered encounter medications on file as of 01/09/2017.     Review of Systems  Constitutional: Negative for appetite change and unexpected weight change.  HENT: Negative for congestion and sinus pressure.   Respiratory: Negative for cough, chest tightness and shortness of breath.   Cardiovascular: Negative for chest pain, palpitations and leg swelling.  Gastrointestinal: Negative for diarrhea, nausea and vomiting.       Previous abdominal discomfort.  Resolved now.    Genitourinary: Negative for difficulty urinating and dysuria.  Musculoskeletal: Negative for back pain and joint swelling.  Skin: Negative for color change and rash.  Neurological: Negative for dizziness, light-headedness and headaches.  Psychiatric/Behavioral: Negative for agitation and dysphoric mood.       Objective:    Physical Exam  Constitutional: He appears well-developed and well-nourished. No distress.  HENT:  Nose: Nose normal.  Mouth/Throat: Oropharynx is clear and moist.  Neck: Neck supple. No thyromegaly present.  Cardiovascular: Normal rate and regular rhythm.   Pulmonary/Chest: Effort normal and breath sounds normal. No respiratory distress.  Abdominal: Soft. Bowel sounds are normal. There is no tenderness.  Musculoskeletal: He exhibits no edema or tenderness.  Lymphadenopathy:    He has no cervical adenopathy.  Skin: No rash noted. No erythema.  Psychiatric: He has a normal mood and affect. His behavior is normal.    BP 134/68 (BP Location: Left Arm, Patient Position: Sitting, Cuff Size: Normal)   Pulse 85   Temp 98.6 F (37 C) (Oral)   Resp 14   Ht _0  (1.626 m)   Wt 147 lb 3.2 oz (66.8 kg)   BMI 25.27 kg/m  Wt Readings from Last 3 Encounters:  01/11/17 148 lb 12.8 oz (67.5 kg)  01/09/17 147 lb 3.2 oz (66.8 kg)  12/23/16 153 lb (69.4 kg)     Lab Results  Component Value Date   WBC 8.2 01/09/2017   HGB 11.8  (L) 01/09/2017   HCT 36.5 (L) 01/09/2017   PLT 362.0 01/09/2017   GLUCOSE 102 (H) 01/09/2017   CHOL 223 (H) 06/24/2015   TRIG 53.0 06/24/2015   HDL 96.10 06/24/2015   LDLCALC 116 (H) 06/24/2015   ALT 8 (L) 12/23/2016   AST 19 12/23/2016   NA 131 (L) 01/09/2017   K 4.8 01/09/2017   CL 90 (L) 01/09/2017   CREATININE 1.18 01/09/2017   BUN 17 01/09/2017   CO2 37 (H) 01/09/2017   TSH 1.38 04/21/2016   PSA 0.83 02/06/2015   INR 0.94 11/06/2012  HGBA1C 6.0 (H) 11/26/2016   MICROALBUR 93.8 (H) 06/24/2015    Dg Chest Portable 1 View  Result Date: 12/23/2016 CLINICAL DATA:  Acute onset of dyspnea.  Initial encounter. EXAM: PORTABLE CHEST 1 VIEW COMPARISON:  Chest radiograph and CTA of the chest performed 11/25/2016 FINDINGS: The lungs are well-aerated. Mild bibasilar atelectasis is noted. There is no evidence of pleural effusion or pneumothorax. The cardiomediastinal silhouette is within normal limits. No acute osseous abnormalities are seen. IMPRESSION: Mild bibasilar atelectasis noted.  Lungs otherwise clear. Electronically Signed   By: Garald Balding M.D.   On: 12/23/2016 01:11   US Abdomen Limited Ruq  Result Date: 12/23/2016 CLINICAL DATA:  83 y/o  M; right upper quadrant pain. EXAM: US ABDOMEN LIMITED - RIGHT UPPER QUADRANT COMPARISON:  None. FINDINGS: Gallbladder: Multiple mobile gallstones measuring up to 6 mm. Negative sonographic Murphy's sign. No secondary signs of acute cholecystitis. Common bile duct: Diameter: 4 mm Liver: No focal lesion identified. Within normal limits in parenchymal echogenicity. IMPRESSION: Cholelithiasis.  No secondary signs of acute cholecystitis. Electronically Signed   By: Kristine Garbe M.D.   On: 12/23/2016 01:44       Assessment & Plan:   Problem List Items Addressed This Visit    Abdominal pain    Presented to ER with abdominal pain.  w/up as outlined.  Ultrasound as outlined.  Non obstructing gallstone.  Already scheduled a f/u appt  with surgery.  Currently asymptomatic.  Follow.       COPD (chronic obstructive pulmonary disease) ([place])    Followed by Dr Alva Garnet.  Breathing stable.        Diabetes mellitus, type II ([place]) - Primary    Low carb diet and exercise.  Follow met b and a1c.   Lab Results  Component Value Date   HGBA1C 6.0 (H) 11/26/2016         Hypertension    Blood pressure under good control.  Continue same medication regimen.  Follow pressures.  Follow metabolic panel.        Hyponatremia    Sodium slightly decreased in the ER.  Recheck today.        Relevant Orders   Basic metabolic panel (Completed)    Other Visit Diagnoses    Anemia, unspecified type       Relevant Orders   CBC with Differential/Platelet (Completed)       [name], MD

## 2017-01-09 NOTE — Progress Notes (Signed)
Pre-visit discussion using our clinic review tool. No additional management support is needed unless otherwise documented below in the visit note.  

## 2017-01-10 LAB — CBC WITH DIFFERENTIAL/PLATELET
BASOS PCT: 1.4 % (ref 0.0–3.0)
Basophils Absolute: 0.1 10*3/uL (ref 0.0–0.1)
EOS PCT: 1.8 % (ref 0.0–5.0)
Eosinophils Absolute: 0.1 10*3/uL (ref 0.0–0.7)
HEMATOCRIT: 36.5 % — AB (ref 39.0–52.0)
HEMOGLOBIN: 11.8 g/dL — AB (ref 13.0–17.0)
LYMPHS PCT: 16.3 % (ref 12.0–46.0)
Lymphs Abs: 1.3 10*3/uL (ref 0.7–4.0)
MCHC: 32.3 g/dL (ref 30.0–36.0)
MCV: 94 fl (ref 78.0–100.0)
Monocytes Absolute: 0.8 10*3/uL (ref 0.1–1.0)
Monocytes Relative: 10.2 % (ref 3.0–12.0)
Neutro Abs: 5.8 10*3/uL (ref 1.4–7.7)
Neutrophils Relative %: 70.3 % (ref 43.0–77.0)
Platelets: 362 10*3/uL (ref 150.0–400.0)
RBC: 3.88 Mil/uL — ABNORMAL LOW (ref 4.22–5.81)
RDW: 13.5 % (ref 11.5–15.5)
WBC: 8.2 10*3/uL (ref 4.0–10.5)

## 2017-01-10 LAB — BASIC METABOLIC PANEL
BUN: 17 mg/dL (ref 6–23)
CALCIUM: 9.2 mg/dL (ref 8.4–10.5)
CO2: 37 mEq/L — ABNORMAL HIGH (ref 19–32)
CREATININE: 1.18 mg/dL (ref 0.40–1.50)
Chloride: 90 mEq/L — ABNORMAL LOW (ref 96–112)
GFR: 75.57 mL/min (ref >60.00)
GLUCOSE: 102 mg/dL — AB (ref 70–99)
POTASSIUM: 4.8 meq/L (ref 3.5–5.1)
Sodium: 131 mEq/L — ABNORMAL LOW (ref 135–145)

## 2017-01-11 ENCOUNTER — Ambulatory Visit (INDEPENDENT_AMBULATORY_CARE_PROVIDER_SITE_OTHER): Payer: Medicare Other | Admitting: Surgery

## 2017-01-11 ENCOUNTER — Other Ambulatory Visit: Payer: Self-pay | Admitting: Internal Medicine

## 2017-01-11 ENCOUNTER — Encounter: Payer: Self-pay | Admitting: Surgery

## 2017-01-11 VITALS — BP 122/70 | HR 84 | Temp 98.0°F | Ht 64.0 in | Wt 148.8 lb

## 2017-01-11 DIAGNOSIS — E871 Hypo-osmolality and hyponatremia: Secondary | ICD-10-CM

## 2017-01-11 DIAGNOSIS — K8020 Calculus of gallbladder without cholecystitis without obstruction: Secondary | ICD-10-CM

## 2017-01-11 NOTE — Progress Notes (Signed)
Order placed for f/u sodium.  ?

## 2017-01-11 NOTE — Progress Notes (Signed)
Surgical Consultation  01/11/2017  Aaron Hendrix is an 83 y.o. male.   CC: Gallstones  HPI: This patient with fairly significant history of COPD with recent hospitalizations for exacerbations. He's also had peptic ulcer disease. He has known gallstones.  Patient experienced right upper quadrant pain with some nausea but no emesis. He's had multiple episodes like this in the past. He's never had fevers or chills and no weight loss. He has changed his diet considerably and has had no further attacks. His wife and daughter present with him. He has significant COPD and a history of peptic ulcer disease that this pain is different than his PUD.  Past Medical History:  Diagnosis Date  . Carpal tunnel syndrome   . COPD with asthma ([place])   . Diabetes mellitus    Diet control   . DJD (degenerative joint disease), cervical   . Duodenal ulcer, with partial obstruction 08/10/2012  . Essential and other specified forms of tremor 12/16/2013  . Hyperlipidemia   . Hypertension    no medicine needed  . IBS (irritable bowel syndrome)   . Internal hemorrhoids   . Memory deficit 12/16/2013  . Numbness and tingling in right hand    started 2 yeas ago  . Peptic ulcer   . Personal history of colonic polyps    adenomatous  . Polyneuropathy in diabetes(357.2)   . Tremor, essential   . Vitamin D deficiency     Past Surgical History:  Procedure Laterality Date  . ANAL FISSURE REPAIR    . ANKLE SURGERY Right    right- pins placed in  . COLONOSCOPY W/ BIOPSIES AND POLYPECTOMY  8/03, 6/05, 7/09, 9/10   internal hemorrhoids, tubular adenomas, mucosa & lymphoid nodules  . TOTAL HIP ARTHROPLASTY Right 11/13/2012   Procedure: TOTAL HIP ARTHROPLASTY ANTERIOR APPROACH;  Surgeon: Mcarthur Rossetti, MD;  Location: [facility];  Service: Orthopedics;  Laterality: Right;  Right total hip arthroplasty  . UPPER GASTROINTESTINAL ENDOSCOPY  3/05, 7/09, 9/10,2013   gastritis, duodenitis    Family History   Problem Relation Age of Onset  . Thyroid cancer Daughter 66  . Diabetes Son   . Urolithiasis Son   . Leukemia Maternal Aunt   . Colon cancer Neg Hx   . Esophageal cancer Neg Hx   . Rectal cancer Neg Hx   . Stomach cancer Neg Hx   . Prostate cancer Neg Hx   . Kidney disease Neg Hx   . Kidney cancer Neg Hx     Social History:  reports that he quit smoking about 53 years ago. His smoking use included Cigarettes. He has never used smokeless tobacco. He reports that he does not drink alcohol or use drugs.  Allergies:  Allergies  Allergen Reactions  . Fexofenadine Other (See Comments)  . Quinapril Hcl Other (See Comments)    Unknown   . Latex Rash    Medications reviewed.   Review of Systems:   Review of Systems  Constitutional: Negative for chills and fever.  HENT: Negative.   Eyes: Negative.   Respiratory: Positive for shortness of breath. Negative for cough.   Cardiovascular: Negative for chest pain and palpitations.  Gastrointestinal: Positive for abdominal pain and nausea. Negative for blood in stool, constipation, diarrhea and vomiting.  Genitourinary: Negative.   Musculoskeletal: Negative.   Skin: Negative.   Neurological: Negative.   Endo/Heme/Allergies: Negative.   Psychiatric/Behavioral: Negative.      Physical Exam:  There were no vitals taken for this visit.  Physical Exam  Constitutional: He is oriented to person, place, and time and well-developed, well-nourished, and in no distress. No distress.  Frail elderly male walks with a cane in no acute distress  HENT:  Head: Normocephalic and atraumatic.  Eyes: Pupils are equal, round, and reactive to light. Right eye exhibits no discharge. Left eye exhibits no discharge. No scleral icterus.  Neck: Normal range of motion.  Cardiovascular: Normal rate, regular rhythm and normal heart sounds.   Pulmonary/Chest: Effort normal and breath sounds normal. No respiratory distress. He has no wheezes. He has no  rales.  Abdominal: Soft. He exhibits no distension. There is no tenderness. There is no rebound and no guarding.  Negative Murphy sign  Musculoskeletal: Normal range of motion. He exhibits edema. He exhibits no tenderness.  Marked edema of both lower extremities  Lymphadenopathy:    He has no cervical adenopathy.  Neurological: He is alert and oriented to person, place, and time.  Skin: Skin is warm and dry. No rash noted. He is not diaphoretic. No erythema.  Psychiatric: Mood and affect normal.  Vitals reviewed.     Results for orders placed or performed in visit on 01/09/17 (from the past 48 hour(s))  CBC with Differential/Platelet     Status: Abnormal   Collection Time: 01/09/17  3:44 PM  Result Value Ref Range   WBC 8.2 4.0 - 10.5 K/uL   RBC 3.88 (L) 4.22 - 5.81 Mil/uL   Hemoglobin 11.8 (L) 13.0 - 17.0 g/dL   HCT 36.5 (L) 39.0 - 52.0 %   MCV 94.0 78.0 - 100.0 fl   MCHC 32.3 30.0 - 36.0 g/dL   RDW 13.5 11.5 - 15.5 %   Platelets 362.0 150.0 - 400.0 K/uL   Neutrophils Relative % 70.3 43.0 - 77.0 %   Lymphocytes Relative 16.3 12.0 - 46.0 %   Monocytes Relative 10.2 3.0 - 12.0 %   Eosinophils Relative 1.8 0.0 - 5.0 %   Basophils Relative 1.4 0.0 - 3.0 %   Neutro Abs 5.8 1.4 - 7.7 K/uL   Lymphs Abs 1.3 0.7 - 4.0 K/uL   Monocytes Absolute 0.8 0.1 - 1.0 K/uL   Eosinophils Absolute 0.1 0.0 - 0.7 K/uL   Basophils Absolute 0.1 0.0 - 0.1 K/uL  Basic metabolic panel     Status: Abnormal   Collection Time: 01/09/17  3:44 PM  Result Value Ref Range   Sodium 131 (L) 135 - 145 mEq/L   Potassium 4.8 3.5 - 5.1 mEq/L   Chloride 90 (L) 96 - 112 mEq/L   CO2 37 (H) 19 - 32 mEq/L   Glucose, Bld 102 (H) 70 - 99 mg/dL   BUN 17 6 - 23 mg/dL   Creatinine, Ser 1.18 0.40 - 1.50 mg/dL   Calcium 9.2 8.4 - 10.5 mg/dL   GFR 75.57 >60.00 mL/min   No results found.  Assessment/Plan:  This patient with gallstones who has had attacks in the past but has altered his diet and has not had any  attacks since then. He was in the emergency room for a gallbladder attack but is also been recently admitted to the hospital for COPD exacerbations he uses a CPAP machine and nebulizer. He was on a ventilator at one point last fall for severe COPD.  With the patient's normalization and control of his symptoms with diet I would not recommend surgery at this time. We can always offer surgery at a later date should he worsen or recur but for right  now his significant COPD might limit his ability to make it out of the hospital. Daughter is a Software engineer and understands this plan very well as does his wife  Florene Glen, MD, FACS

## 2017-01-11 NOTE — Patient Instructions (Signed)
Please call our office if you have any questions or concerns.  

## 2017-01-15 ENCOUNTER — Encounter: Payer: Self-pay | Admitting: Internal Medicine

## 2017-01-15 NOTE — Assessment & Plan Note (Signed)
Sodium slightly decreased in the ER.  Recheck today.

## 2017-01-15 NOTE — Assessment & Plan Note (Signed)
Blood pressure under good control.  Continue same medication regimen.  Follow pressures.  Follow metabolic panel.   

## 2017-01-15 NOTE — Assessment & Plan Note (Signed)
Followed by Dr Alva Garnet.  Breathing stable.

## 2017-01-15 NOTE — Assessment & Plan Note (Signed)
Presented to ER with abdominal pain.  w/up as outlined.  Ultrasound as outlined.  Non obstructing gallstone.  Already scheduled a f/u appt with surgery.  Currently asymptomatic.  Follow.

## 2017-01-15 NOTE — Assessment & Plan Note (Signed)
Low carb diet and exercise.  Follow met b and a1c.   Lab Results  Component Value Date   HGBA1C 6.0 (H) 11/26/2016

## 2017-01-17 ENCOUNTER — Other Ambulatory Visit (INDEPENDENT_AMBULATORY_CARE_PROVIDER_SITE_OTHER): Payer: Medicare Other

## 2017-01-17 DIAGNOSIS — E871 Hypo-osmolality and hyponatremia: Secondary | ICD-10-CM | POA: Diagnosis not present

## 2017-01-17 LAB — SODIUM: SODIUM: 139 meq/L (ref 135–145)

## 2017-01-23 ENCOUNTER — Ambulatory Visit (INDEPENDENT_AMBULATORY_CARE_PROVIDER_SITE_OTHER): Payer: Medicare Other

## 2017-01-23 VITALS — BP 124/62 | HR 96 | Temp 98.0°F | Resp 14 | Ht 64.0 in | Wt 143.4 lb

## 2017-01-23 DIAGNOSIS — Z0000 Encounter for general adult medical examination without abnormal findings: Secondary | ICD-10-CM

## 2017-01-23 NOTE — Patient Instructions (Addendum)
  [name] , Thank you for taking time to come for your Medicare Wellness Visit. I appreciate your ongoing commitment to your health goals. Please review the following plan we discussed and let me know if I can assist you in the future.   Make diabetic eye exam.  It was nice to meet you!   Follow up with Dr. Nicki Reaper as needed.  These are the goals we discussed: Goals    . Increase physical activity          Chair exercises, daily.    . Increase water intake          Stay hydrated and drink plenty of fluids/water.   Fill and finish entire cup of water when taking medications.        This is a list of the screening recommended for you and due dates:  Health Maintenance  Topic Date Due  . Eye exam for diabetics  08/28/1942  . Tetanus Vaccine  08/29/1951  . Pneumonia vaccines (1 of 2 - PCV13) 08/28/1997  . Flu Shot  04/26/2017  . Hemoglobin A1C  05/29/2017  . Complete foot exam   12/19/2017  . Colon Cancer Screening  02/10/2020

## 2017-01-23 NOTE — Progress Notes (Signed)
Subjective:   Aaron Hendrix is a 83 y.o. male who presents for an Initial Medicare Annual Wellness Visit.  Review of Systems  No ROS.  Medicare Wellness Visit.  Cardiac Risk Factors include: advanced age (>97mn, >>21women);diabetes mellitus;hypertension;male gender    Objective:    Today's Vitals   01/23/17 1350  BP: 124/62  Pulse: 96  Resp: 14  Temp: 98 F (36.7 C)  TempSrc: Oral  SpO2: 98%  Weight: 143 lb 6.4 oz (65 kg)  Height: '5\' 4"'$  (1.626 m)   Body mass index is 24.61 kg/m.  Current Medications (verified) Outpatient Encounter Prescriptions as of 01/23/2017  Medication Sig  . albuterol (PROAIR HFA) 108 (90 Base) MCG/ACT inhaler Inhale 1-2 puffs into the lungs every 6 (six) hours as needed for wheezing or shortness of breath.  . finasteride (PROSCAR) 5 MG tablet Take 1 tablet (5 mg total) by mouth daily.  . fluticasone furoate-vilanterol (BREO ELLIPTA) 100-25 MCG/INH AEPB Inhale 1 puff into the lungs daily.  . furosemide (LASIX) 20 MG tablet Take 1 tablet (20 mg total) by mouth daily. (Patient taking differently: Take 40 mg by mouth daily. )  . ipratropium-albuterol (DUONEB) 0.5-2.5 (3) MG/3ML SOLN Take 3 mLs by nebulization 3 (three) times daily.  .Marland Kitchenlevothyroxine (SYNTHROID, LEVOTHROID) 50 MCG tablet Take 1 tablet (50 mcg total) by mouth daily before breakfast.  . losartan (COZAAR) 50 MG tablet Take 1 tablet (50 mg total) by mouth daily.  . pantoprazole (PROTONIX) 40 MG tablet Take 1 tablet (40 mg total) by mouth daily.  .Marland Kitchensaccharomyces boulardii (FLORASTOR) 250 MG capsule Take 250 mg by mouth as needed.   . tamsulosin (FLOMAX) 0.4 MG CAPS capsule Take 1 capsule (0.4 mg total) by mouth daily.   No facility-administered encounter medications on file as of 01/23/2017.     Allergies (verified) Fexofenadine; Quinapril hcl; and Latex   History: Past Medical History:  Diagnosis Date  . Carpal tunnel syndrome   . [place]   . Diabetes mellitus    Diet control   . DJD (degenerative joint disease), cervical   . Duodenal ulcer, with partial obstruction 08/10/2012  . Essential and other specified forms of tremor 12/16/2013  . Hyperlipidemia   . Hypertension    no medicine needed  . IBS (irritable bowel syndrome)   . Internal hemorrhoids   . Memory deficit 12/16/2013  . Numbness and tingling in right hand    started 2 yeas ago  . Peptic ulcer   . Personal history of colonic polyps    adenomatous  . Polyneuropathy in diabetes(357.2)   . Tremor, essential   . Vitamin D deficiency    Past Surgical History:  Procedure Laterality Date  . ANAL FISSURE REPAIR    . ANKLE SURGERY Right    right- pins placed in  . COLONOSCOPY W/ BIOPSIES AND POLYPECTOMY  8/03, 6/05, 7/09, 9/10   internal hemorrhoids, tubular adenomas, mucosa & lymphoid nodules  . INGUINAL HERNIA REPAIR Right   . TOTAL HIP ARTHROPLASTY Right 11/13/2012   Procedure: TOTAL HIP ARTHROPLASTY ANTERIOR APPROACH;  Surgeon: CMcarthur Rossetti MD;  [facility] [facility]  Service: Orthopedics;  Laterality: Right;  Right total hip arthroplasty  . UPPER GASTROINTESTINAL ENDOSCOPY  3/05, 7/09, 9/10,2013   gastritis, duodenitis   Family History  Problem Relation Age of Onset  . Thyroid cancer Daughter 335 . Diabetes Son   . Urolithiasis Son   . Leukemia Maternal Aunt   . Colon cancer  Neg Hx   . Esophageal cancer Neg Hx   . Rectal cancer Neg Hx   . Stomach cancer Neg Hx   . Prostate cancer Neg Hx   . Kidney disease Neg Hx   . Kidney cancer Neg Hx    Social History   Occupational History  . 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 Smoking status: Former Smoker    Types: Cigarettes    Quit date: 07/02/1963  . Smokeless tobacco: Never Used  . Alcohol use No  . Drug use: No  . Sexual activity: Not on file   Tobacco Counseling Counseling given: Not Answered   Activities of Daily Living In your present state of health, do you have any  difficulty performing the following activities: 01/23/2017 11/26/2016  Hearing? Tempie Donning  Vision? N N  Difficulty concentrating or making decisions? Y N  Walking or climbing stairs? Y N  Dressing or bathing? N N  Doing errands, shopping? Tempie Donning  Preparing Food and eating ? Y -  Using the Toilet? N -  In the past six months, have you accidently leaked urine? Y -  Do you have problems with loss of bowel control? N -  Managing your Medications? Y -  Managing your Finances? Y -  Housekeeping or managing your Housekeeping? Y -  Some recent data might be hidden    Immunizations and Health Maintenance Immunization History  Administered Date(s) Administered  . Influenza Split 07/08/2014  . Influenza, High Dose Seasonal PF 06/20/2016  . Influenza,inj,Quad PF,36+ Mos 06/10/2015   Health Maintenance Due  Topic Date Due  . OPHTHALMOLOGY EXAM  08/28/1942  . TETANUS/TDAP  08/29/1951  . PNA vac Low Risk Adult (1 of 2 - PCV13) 08/28/1997    Patient Care Team: [name], MD as PCP - General (Internal Medicine) [name], MD as Referring Physician (Internal Medicine)  Indicate any recent Medical Services you may have received from other than [facility] providers in the past year (date may be approximate).    Assessment:   This is a routine wellness examination for [place]. The goal of the wellness visit is to assist the patient how to close the gaps in care and create a preventative care plan for the patient.   Osteoporosis risk reviewed.  Medications reviewed; taking without issues or barriers.  Safety issues reviewed; son lives in the home.  Smoke and carbon monoxide detectors in the home. No firearms in the home.  Wears seatbelts when riding with others. Patient does wear sunscreen or protective clothing when in direct sunlight. No violence in the home.  Depression- PHQ 2 &9 complete.  No signs/symptoms or verbal communication regarding little pleasure in doing things, feeling down,  depressed or hopeless. No changes in sleeping, energy, eating, concentrating.  No thoughts of self harm or harm towards others.  Time spent on this topic is 8 minutes.   Patient is alert, normal appearance, oriented to person/place/and time. Correctly identified the president of the [place], recall of 0/5 words identified in a phrase, and performing simple calculations.  Patient displays appropriate judgement and can read correct time from watch face.  No new identified risk were noted.  No failures at ADL's or IADL's. Cane in use when ambulating.  BMI- discussed the importance of a healthy diet, water intake and exercise. Educational material provided.   Diet:  Baked/grilled foods, low carb  Daily fluid intake: 3 cups of juice, 1 cup of  water.  Encouraged to watch sugar intake with juices and increase water intake.  HTN- followed by PCP.  Dental- every six months.  Dr. Levie Heritage.  Top/bottom partial.  Sleep patterns- Sleeps 7-8 hours at night.  Wakes feeling rested. CPAP in use.  Prevnar 13 and TDAP vaccine deferred per patient preference.    Educational material provided.  Patient Concerns: None at this time. Follow up with PCP as needed.  Hearing/Vision screen Hearing Screening Comments: Patient has difficulty hearing conversational tones.  Audiologic testing performed in the passed year.  He has hearing aids but prefers not to wear them.  Vision Screening Comments: Followed by Dr Ellin Mayhew Phillip Heal) Wears corrective lenses Next scheduled exam 02/01/17 Visual acuity not assessed per patient preference.  He plans to make an appointment with his ophthalmologist on 02/01/17.  Dietary issues and exercise activities discussed: Current Exercise Habits: The patient does not participate in regular exercise at present, Exercise limited by: respiratory conditions(s) (COPD)  Goals    . Increase physical activity          Chair exercises, daily.    . Increase water intake          Stay  hydrated and drink plenty of fluids/water.   Fill and finish entire cup of water when taking medications.       Depression Screen PHQ 2/9 Scores 01/23/2017 08/23/2016 06/10/2015 09/23/2014  PHQ - 2 Score 0 0 0 0  PHQ- 9 Score 0 - - -    Fall Risk Fall Risk  01/23/2017 08/23/2016 06/10/2015 09/23/2014 11/26/2013  Falls in the past year? No No No No No    Cognitive Function:  Memory deficit.  PCP aware.     6CIT Screen 01/23/2017  What Year? 0 points  What month? 0 points  What time? 0 points  Count back from 20 0 points  Months in reverse 2 points  Repeat phrase 10 points  Total Score 12    Screening Tests Health Maintenance  Topic Date Due  . OPHTHALMOLOGY EXAM  08/28/1942  . TETANUS/TDAP  08/29/1951  . PNA vac Low Risk Adult (1 of 2 - PCV13) 08/28/1997  . INFLUENZA VACCINE  04/26/2017  . HEMOGLOBIN A1C  05/29/2017  . FOOT EXAM  12/19/2017  . COLONOSCOPY  02/10/2020        Plan:    End of life planning; Advance aging; Advanced directives discussed. Copy of current HCPOA/Living Will on file.    I have personally reviewed and noted the following in the patient's chart:   . Medical and social history . Use of alcohol, tobacco or illicit drugs  . Current medications and supplements . Functional ability and status . Nutritional status . Physical activity . Advanced directives . List of other physicians . Hospitalizations, surgeries, and ER visits in previous 12 months . Vitals . Screenings to include cognitive, depression, and falls . Referrals and appointments  In addition, I have reviewed and discussed with patient certain preventive protocols, quality metrics, and best practice recommendations. A written personalized care plan for preventive services as well as general preventive health recommendations were provided to patient.     Varney Biles, LPN   04/07/4579

## 2017-01-23 NOTE — Progress Notes (Signed)
I have reviewed the above note and agree. If patient has not discuss his memory issues with his PCP he should have follow-up to discuss these.   Tommi Rumps, M.D.

## 2017-03-03 ENCOUNTER — Encounter: Payer: Self-pay | Admitting: Internal Medicine

## 2017-03-03 ENCOUNTER — Ambulatory Visit (INDEPENDENT_AMBULATORY_CARE_PROVIDER_SITE_OTHER): Payer: Medicare Other | Admitting: Internal Medicine

## 2017-03-03 VITALS — BP 124/60 | HR 81 | Temp 98.6°F | Resp 14 | Ht 64.17 in | Wt 147.6 lb

## 2017-03-03 DIAGNOSIS — I10 Essential (primary) hypertension: Secondary | ICD-10-CM | POA: Diagnosis not present

## 2017-03-03 DIAGNOSIS — R0902 Hypoxemia: Secondary | ICD-10-CM | POA: Diagnosis not present

## 2017-03-03 DIAGNOSIS — L989 Disorder of the skin and subcutaneous tissue, unspecified: Secondary | ICD-10-CM | POA: Diagnosis not present

## 2017-03-03 DIAGNOSIS — E119 Type 2 diabetes mellitus without complications: Secondary | ICD-10-CM

## 2017-03-03 DIAGNOSIS — E039 Hypothyroidism, unspecified: Secondary | ICD-10-CM

## 2017-03-03 DIAGNOSIS — J441 Chronic obstructive pulmonary disease with (acute) exacerbation: Secondary | ICD-10-CM

## 2017-03-03 NOTE — Progress Notes (Signed)
[name], male   DOB: 1930-05-26, 83 y.o.   MRN: [id number]   Subjective:    [name], male    DOB: November 20, 1929, 83 y.o.   MRN: [id number]  HPI  Patient here for his physical exam.   He is accompanied by his wife.  History obtained from both of them.  He reports he is doing relatively well.  Eating.  No nausea or vomiting.  No abdominal pain.  Breathing stable.  No chest pain.  Bowels stable.  Lesion - posterior head.  No pain.     Past Medical History:  Diagnosis Date  . Carpal tunnel syndrome   . COPD with asthma ([place])   . Diabetes mellitus    Diet control   . DJD (degenerative joint disease), cervical   . Duodenal ulcer, with partial obstruction 08/10/2012  . Essential and other specified forms of tremor 12/16/2013  . Hyperlipidemia   . Hypertension    no medicine needed  . IBS (irritable bowel syndrome)   . Internal hemorrhoids   . Memory deficit 12/16/2013  . Numbness and tingling in right hand    started 2 yeas ago  . Peptic ulcer   . Personal history of colonic polyps    adenomatous  . Polyneuropathy in diabetes(357.2)   . Tremor, essential   . Vitamin D deficiency    Past Surgical History:  Procedure Laterality Date  . ANAL FISSURE REPAIR    . ANKLE SURGERY Right    right- pins placed in  . COLONOSCOPY W/ BIOPSIES AND POLYPECTOMY  8/03, 6/05, 7/09, 9/10   internal hemorrhoids, tubular adenomas, mucosa & lymphoid nodules  . INGUINAL HERNIA REPAIR Right   . TOTAL HIP ARTHROPLASTY Right 11/13/2012   Procedure: TOTAL HIP ARTHROPLASTY ANTERIOR APPROACH;  Surgeon: Mcarthur Rossetti, MD;  Location: [facility];  Service: Orthopedics;  Laterality: Right;  Right total hip arthroplasty  . UPPER GASTROINTESTINAL ENDOSCOPY  3/05, 7/09, 9/10,2013   gastritis, duodenitis   Family History  Problem Relation Age of Onset  . Thyroid cancer Daughter 28  . Diabetes Son   . Urolithiasis Son   . Leukemia Maternal Aunt   . Colon cancer Neg Hx    . Esophageal cancer Neg Hx   . Rectal cancer Neg Hx   . Stomach cancer Neg Hx   . Prostate cancer Neg Hx   . Kidney disease Neg Hx   . Kidney cancer Neg Hx    Social History   Social History  . Marital status: Married    Spouse name: N/A  . Number of children: 3  . Years of education: MA   Occupational History  . 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 Smoking status: Former Smoker    Types: Cigarettes    Quit date: 07/02/1963  . Smokeless tobacco: Never Used  . Alcohol use No  . Drug use: No  . Sexual activity: Not Asked   Other Topics Concern  . None   Social History Narrative   Veteran [employer]    Outpatient Encounter Prescriptions as of 03/03/2017  Medication Sig  . albuterol (PROAIR HFA) 108 (90 Base) MCG/ACT inhaler Inhale 1-2 puffs into the lungs every 6 (six) hours as needed for wheezing or shortness of breath.  . finasteride (PROSCAR) 5 MG tablet Take 1 tablet (5 mg total) by mouth daily.  . fluticasone furoate-vilanterol (BREO ELLIPTA) 100-25 MCG/INH AEPB Inhale 1  puff into the lungs daily.  . furosemide (LASIX) 20 MG tablet Take 1 tablet (20 mg total) by mouth daily. (Patient taking differently: Take 40 mg by mouth daily. )  . ipratropium-albuterol (DUONEB) 0.5-2.5 (3) MG/3ML SOLN Take 3 mLs by nebulization 3 (three) times daily. (Patient taking differently: Take 3 mLs by nebulization 3 (three) times daily. )  . levothyroxine (SYNTHROID, LEVOTHROID) 50 MCG tablet Take 1 tablet (50 mcg total) by mouth daily before breakfast.  . losartan (COZAAR) 50 MG tablet Take 1 tablet (50 mg total) by mouth daily.  . pantoprazole (PROTONIX) 40 MG tablet Take 1 tablet (40 mg total) by mouth daily.  Marland Kitchen saccharomyces boulardii (FLORASTOR) 250 MG capsule Take 250 mg by mouth as needed.   . tamsulosin (FLOMAX) 0.4 MG CAPS capsule Take 1 capsule (0.4 mg total) by mouth daily.   No facility-administered encounter medications on file as of 03/03/2017.      Review of Systems  Constitutional: Negative for appetite change and unexpected weight change.  HENT: Negative for congestion and sinus pressure.   Eyes: Negative for pain and visual disturbance.  Respiratory: Negative for cough, chest tightness and shortness of breath.   Cardiovascular: Negative for chest pain, palpitations and leg swelling.  Gastrointestinal: Negative for abdominal pain, diarrhea, nausea and vomiting.  Genitourinary: Negative for difficulty urinating and dysuria.  Musculoskeletal: Negative for back pain and joint swelling.  Skin: Negative for rash and wound.  Neurological: Negative for dizziness, light-headedness and headaches.  Hematological: Negative for adenopathy. Does not bruise/bleed easily.  Psychiatric/Behavioral: Negative for agitation and dysphoric mood.       Objective:     Blood pressure rechecked by me:  128/72  Physical Exam  Constitutional: He is oriented to person, place, and time. He appears well-developed and well-nourished. No distress.  HENT:  Head: Normocephalic and atraumatic.  Nose: Nose normal.  Mouth/Throat: Oropharynx is clear and moist. No oropharyngeal exudate.  Eyes: Conjunctivae are normal. Right eye exhibits no discharge. Left eye exhibits no discharge.  Neck: Neck supple. No thyromegaly present.  Cardiovascular: Normal rate and regular rhythm.   Pulmonary/Chest: Breath sounds normal. No respiratory distress. He has no wheezes.  Abdominal: Soft. Bowel sounds are normal. There is no tenderness.  Genitourinary:  Genitourinary Comments: Deferred.   Musculoskeletal: He exhibits no edema or tenderness.  Lymphadenopathy:    He has no cervical adenopathy.  Neurological: He is alert and oriented to person, place, and time.  Skin: Skin is warm and dry. No rash noted. No erythema.  Psychiatric: He has a normal mood and affect. His behavior is normal.    BP 124/60 (BP Location: Left Arm, Patient Position: Sitting, Cuff Size: Normal)    Pulse 81   Temp 98.6 F (37 C) (Oral)   Resp 14   Ht 5' 4.17" (1.63 m)   Wt 147 lb 9.6 oz (67 kg)   SpO2 97%   BMI 25.20 kg/m  Wt Readings from Last 3 Encounters:  03/03/17 147 lb 9.6 oz (67 kg)  01/23/17 143 lb 6.4 oz (65 kg)  01/11/17 148 lb 12.8 oz (67.5 kg)     Lab Results  Component Value Date   WBC 8.2 01/09/2017   HGB 11.8 (L) 01/09/2017   HCT 36.5 (L) 01/09/2017   PLT 362.0 01/09/2017   GLUCOSE 102 (H) 01/09/2017   CHOL 223 (H) 06/24/2015   TRIG 53.0 06/24/2015   HDL 96.10 06/24/2015   LDLCALC 116 (H) 06/24/2015   ALT 8 (L) 12/23/2016  AST 19 12/23/2016   NA 139 01/17/2017   K 4.8 01/09/2017   CL 90 (L) 01/09/2017   CREATININE 1.18 01/09/2017   BUN 17 01/09/2017   CO2 37 (H) 01/09/2017   TSH 1.38 04/21/2016   PSA 0.83 02/06/2015   INR 0.94 11/06/2012   HGBA1C 6.0 (H) 11/26/2016   MICROALBUR 93.8 (H) 06/24/2015    Dg Chest Portable 1 View  Result Date: 12/23/2016 CLINICAL DATA:  Acute onset of dyspnea.  Initial encounter. EXAM: PORTABLE CHEST 1 VIEW COMPARISON:  Chest radiograph and CTA of the chest performed 11/25/2016 FINDINGS: The lungs are well-aerated. Mild bibasilar atelectasis is noted. There is no evidence of pleural effusion or pneumothorax. The cardiomediastinal silhouette is within normal limits. No acute osseous abnormalities are seen. IMPRESSION: Mild bibasilar atelectasis noted.  Lungs otherwise clear. Electronically Signed   By: Garald Balding M.D.   On: 12/23/2016 01:11   US Abdomen Limited Ruq  Result Date: 12/23/2016 CLINICAL DATA:  83 y/o  M; right upper quadrant pain. EXAM: US ABDOMEN LIMITED - RIGHT UPPER QUADRANT COMPARISON:  None. FINDINGS: Gallbladder: Multiple mobile gallstones measuring up to 6 mm. Negative sonographic Murphy's sign. No secondary signs of acute cholecystitis. Common bile duct: Diameter: 4 mm Liver: No focal lesion identified. Within normal limits in parenchymal echogenicity. IMPRESSION: Cholelithiasis.  No secondary  signs of acute cholecystitis. Electronically Signed   By: Kristine Garbe M.D.   On: 12/23/2016 01:44       Assessment & Plan:   Problem List Items Addressed This Visit    COPD (chronic obstructive pulmonary disease) ([place])    Followed by Dr Alva Garnet.  Has f/u planned soon.  Feels breathing overall stable.        Diabetes mellitus ([place])    Follow met b and a1c.        Hypertension    Blood pressure under good control.  Continue same medication regimen.  Follow pressures.  Follow metabolic panel.        Hypothyroidism    On thyroid replacement.  Follow tsh.        Hypoxia    She reports her pulse ox at home averages 89-91% sitting.  Here, 91-94% on room air.  Plans to discuss with Dr Alva Garnet.  Overall he feels his breathing is stable.         Other Visit Diagnoses    Scalp lesion    -  Primary   Persistent.  refer to dermatology.     Relevant Orders   Ambulatory referral to Dermatology       [name], MD

## 2017-03-03 NOTE — Progress Notes (Signed)
Pre-visit discussion using our clinic review tool. No additional management support is needed unless otherwise documented below in the visit note.  

## 2017-03-05 ENCOUNTER — Encounter: Payer: Self-pay | Admitting: Internal Medicine

## 2017-03-05 NOTE — Assessment & Plan Note (Signed)
On thyroid replacement.  Follow tsh.  

## 2017-03-05 NOTE — Assessment & Plan Note (Signed)
She reports her pulse ox at home averages 89-91% sitting.  Here, 91-94% on room air.  Plans to discuss with Dr Alva Garnet.  Overall he feels his breathing is stable.

## 2017-03-05 NOTE — Assessment & Plan Note (Signed)
Blood pressure under good control.  Continue same medication regimen.  Follow pressures.  Follow metabolic panel.   

## 2017-03-05 NOTE — Assessment & Plan Note (Signed)
Followed by Dr Alva Garnet.  Has f/u planned soon.  Feels breathing overall stable.

## 2017-03-05 NOTE — Assessment & Plan Note (Signed)
Follow met b and a1c.

## 2017-03-06 ENCOUNTER — Encounter: Payer: Self-pay | Admitting: Pulmonary Disease

## 2017-03-06 ENCOUNTER — Ambulatory Visit (INDEPENDENT_AMBULATORY_CARE_PROVIDER_SITE_OTHER): Payer: Medicare Other | Admitting: Pulmonary Disease

## 2017-03-06 ENCOUNTER — Ambulatory Visit
Admission: RE | Admit: 2017-03-06 | Discharge: 2017-03-06 | Disposition: A | Discharge status: Home / Self Care | Payer: Medicare Other | Source: Ambulatory Visit | Attending: Pulmonary Disease | Admitting: Pulmonary Disease

## 2017-03-06 VITALS — BP 128/62 | HR 103 | Ht 64.0 in | Wt 144.0 lb

## 2017-03-06 DIAGNOSIS — J449 Chronic obstructive pulmonary disease, unspecified: Secondary | ICD-10-CM | POA: Diagnosis not present

## 2017-03-06 DIAGNOSIS — J9611 Chronic respiratory failure with hypoxia: Secondary | ICD-10-CM | POA: Diagnosis not present

## 2017-03-06 DIAGNOSIS — G4733 Obstructive sleep apnea (adult) (pediatric): Secondary | ICD-10-CM

## 2017-03-06 DIAGNOSIS — K8020 Calculus of gallbladder without cholecystitis without obstruction: Secondary | ICD-10-CM | POA: Diagnosis not present

## 2017-03-06 DIAGNOSIS — R109 Unspecified abdominal pain: Secondary | ICD-10-CM

## 2017-03-06 DIAGNOSIS — R1011 Right upper quadrant pain: Secondary | ICD-10-CM | POA: Insufficient documentation

## 2017-03-06 DIAGNOSIS — R54 Age-related physical debility: Secondary | ICD-10-CM

## 2017-03-06 DIAGNOSIS — J9612 Chronic respiratory failure with hypercapnia: Secondary | ICD-10-CM

## 2017-03-06 MED ORDER — UMECLIDINIUM-VILANTEROL 62.5-25 MCG/INH IN AEPB: 1.0000 | INHALATION_SPRAY | Freq: Every day | RESPIRATORY_TRACT | 5 refills | Status: DC

## 2017-03-06 NOTE — Addendum Note (Signed)
Addended by: Oscar La R on: 03/06/2017 10:17 AM   Modules accepted: Orders

## 2017-03-06 NOTE — Progress Notes (Signed)
PROFILE: Hospitalized 9/29 - 07/06/15 initially with AMS and hyponatremia. Developed progressive hypersomnolence and was found to be profoundly hypercarbic (PaCO2 162 torr!!). Underwent intubation and mechanical ventilation 9/30 - 07/01/15. PCCM was involved in his care during his time in the ICU. It was felt that the hypercarbic resp failure was due to COPD and he was discharged home on bronchodilator therapy with pulmonary follow up. On his initial CXR, there was concern for a RUL nodule which was confirmed by CT chest. Again hospitalized 10/20-10/26/16 for weakness and hyponatremia. Seen in consultation by Dr Stevenson Clinch CXR 07/02/15: NACPD CT chest 07/20/15: RUL "nodule" smaller than previously noted in Sept PET 07/22/15: 1. No specific findings identified to suggest hypermetabolic/FDG avid tumor. Continued resolution of right upper lobe pulmonary nodule which currently measures 1.3 cm and exhibits non malignant range FDG uptake Spirometry 07/30/15: FVC  2.09 73%,  FEV1 1.47 68%, FEV1% 70%   PSG 07/27/15: Moderate OSA (AHI 29) LE venous US 08/02/15: no DVT Hospitalized 03/02-03/04/18 for acute on chronic hypercarbic respiratory failure with hypoxemia. Discharge diagnosis was COPD exacerbation. CPAP compliance 05/08-06/06/18: Usage 29/30 days, > 4 hours 8 days, < 4 hours 21 days  PROBLEMS: COPD - mild obstruction at baseline Chronic hypercarbic respiratory failure Moderate OSA. Treated with CPAP   INTERVAL HISTORY: Hospitalized in March briefly for COPD exacerbation. No other major events  SUBJ: This is a routine reevaluation for problems listed above. As noted above, he was hospitalized in March this year with a COPD exacerbation. He has a home pulse oximeter. His wife reports that he often has oxygen saturations less than 90%. This was also noted in Dr. Bary Leriche office. He has no home oxygen. He awakens frequently with dyspnea. He has difficulty tolerating CPAP and often takes the mask off in the  middle of night He remains profoundly limited by weakness and shortness of breath. His wife also reports that he is having increasing abdominal pain. An abdominal ultrasound in March revealed numerous gallstones. He was not considered a candidate for cholecystectomy. Denies CP, fever, purulent sputum, hemoptysis, LE edema and calf tenderness.   OBJ: Vitals:   03/06/17 0934  BP: 128/62  Pulse: (!) 103  SpO2: (!) 86%  Weight: 144 lb (65.3 kg)  Height: 5\' 4"  (1.626 m)  RA  SpO2 92% on 1 lpm [place]   NAD HEENT WNL No JVD noted BS markedly diminished without wheezing. Faint bibasilar crackles Reg, no M noted Abd soft, moderate right upper quadrant tenderness, +BS Ext 3+ symmetric BLE pitting edema Neuro: diffusely weak, no focal deficits  Outpatient Encounter Prescriptions as of 03/06/2017  Medication Sig  . albuterol (PROAIR HFA) 108 (90 Base) MCG/ACT inhaler Inhale 1-2 puffs into the lungs every 6 (six) hours as needed for wheezing or shortness of breath.  . finasteride (PROSCAR) 5 MG tablet Take 1 tablet (5 mg total) by mouth daily.  . fluticasone furoate-vilanterol (BREO ELLIPTA) 100-25 MCG/INH AEPB Inhale 1 puff into the lungs daily.  . furosemide (LASIX) 20 MG tablet Take 1 tablet (20 mg total) by mouth daily. (Patient taking differently: Take 40 mg by mouth daily. )  . ipratropium-albuterol (DUONEB) 0.5-2.5 (3) MG/3ML SOLN Take 3 mLs by nebulization 3 (three) times daily. (Patient taking differently: Take 3 mLs by nebulization 3 (three) times daily. )  . levothyroxine (SYNTHROID, LEVOTHROID) 50 MCG tablet Take 1 tablet (50 mcg total) by mouth daily before breakfast.  . losartan (COZAAR) 50 MG tablet Take 1 tablet (50 mg total) by mouth daily.  Marland Kitchen  pantoprazole (PROTONIX) 40 MG tablet Take 1 tablet (40 mg total) by mouth daily.  Marland Kitchen saccharomyces boulardii (FLORASTOR) 250 MG capsule Take 250 mg by mouth as needed.   . tamsulosin (FLOMAX) 0.4 MG CAPS capsule Take 1 capsule (0.4 mg total) by  mouth daily.   No facility-administered encounter medications on file as of 03/06/2017.    DATA: BMP Latest Ref Rng & Units 01/17/2017 01/09/2017 12/23/2016  Glucose 70 - 99 mg/dL - 102(H) 139(H)  BUN 6 - 23 mg/dL - 17 19  Creatinine 0.40 - 1.50 mg/dL - 1.18 1.10  Sodium 135 - 145 mEq/L 139 131(L) 134(L)  Potassium 3.5 - 5.1 mEq/L - 4.8 4.2  Chloride 96 - 112 mEq/L - 90(L) 91(L)  CO2 19 - 32 mEq/L - 37(H) 38(H)  Calcium 8.4 - 10.5 mg/dL - 9.2 9.1    CBC Latest Ref Rng & Units 01/09/2017 12/23/2016 11/26/2016  WBC 4.0 - 10.5 K/uL 8.2 11.0(H) 7.0  Hemoglobin 13.0 - 17.0 g/dL 11.8(L) 11.7(L) 12.2(L)  Hematocrit 39.0 - 52.0 % 36.5(L) 35.5(L) 37.0(L)  Platelets 150.0 - 400.0 K/uL 362.0 339 335   CXR 12/23/16: No acute findings  IMPRESSION: COPD - mild obstruction at baseline. However he has severe chronic hypercarbic and hypoxemic respiratory failure. The degree of hypercarbia is out of proportion to obstruction on pulmonary function testing.  Chronic hypercarbic and hypoxemic respiratory failure  Moderate obstructive sleep apnea with difficulty tolerating CPAP  Abdominal pain with history of gallstones   Extreme frailty and debilitation  PLAN: 1) change Breo to Anoro inhaler - one inhalation daily 2) continue DuoNeb as needed up to 4 times per day 3) initiate oxygen therapy. One LPM by nasal cannula while sitting quietly or at rest. 2 LPM with exertion and sleep 4) Change CPAP to BiPAP - 12/8 for obstructive sleep apnea and chronic hypercarbic respiratory failure 5) We began the discussion regarding advanced directives. I have ordered referral to hospice and palliative care [place] 6) RUQ ultrasound ordered for today to evaluate abdominal pain with history of gallstones 7) follow-up in 6-8 weeks   Merton Border, MD PCCM service Mobile [phone] Pager [phone] 03/06/2017 9:35 AM

## 2017-03-06 NOTE — Patient Instructions (Signed)
1) change Breo to Anoro inhaler - one inhalation daily 2) continue DuoNeb as needed up to 4 times per day 3) initiate oxygen therapy. Wear 1 LPM by nasal cannula while sitting quietly or at rest. 2 LPM with exertion and sleep 4) we will try to change CPAP to BiPAP. This might require a repeat sleep study for titration of the BiPAP 5) we began the discussion regarding advanced directives. We will ask palliative care services to visit you and your family to continue these discussions 6) right upper quadrant ultrasound has been ordered to evaluate abdominal pain

## 2017-03-06 NOTE — Addendum Note (Signed)
Addended by: Oscar La R on: 03/06/2017 11:45 AM   Modules accepted: Orders

## 2017-03-06 NOTE — Addendum Note (Signed)
Addended by: Oscar La R on: 03/06/2017 10:31 AM   Modules accepted: Orders

## 2017-03-07 ENCOUNTER — Other Ambulatory Visit: Payer: Self-pay | Admitting: *Deleted

## 2017-03-07 ENCOUNTER — Telehealth: Payer: Self-pay | Admitting: Pulmonary Disease

## 2017-03-07 DIAGNOSIS — G4733 Obstructive sleep apnea (adult) (pediatric): Secondary | ICD-10-CM

## 2017-03-07 DIAGNOSIS — J9611 Chronic respiratory failure with hypoxia: Secondary | ICD-10-CM

## 2017-03-07 NOTE — Telephone Encounter (Signed)
Pt wife states oxygen company has not come out yet. Please call.

## 2017-03-07 NOTE — Telephone Encounter (Signed)
Aaron Hendrix from [facility] is calling you all to tell you what all she needs for this pt Aaron Hendrix

## 2017-03-07 NOTE — Telephone Encounter (Signed)
Spoke with daughter and informed her that there is a process and that it has to be approved through insurance and once the approval is gotten then they will get a call to schedule once approved. Informed her that the [place] office is working on it at this time. Nothing further needed.

## 2017-03-07 NOTE — Telephone Encounter (Signed)
Message sent to provider re: addendum to patient chart.   Our referral support team have reviewed the order and need an amendment made to the note. The patients insurance now requires a tired and failed statement in the notes for alternate methods tired. Can you have the MD add this? It can state  "_______ was used for patients copd and it was found to be insufficient

## 2017-03-07 NOTE — Progress Notes (Signed)
Order placed for [facility] to bleed O2 into CPAP until bipap titration is completed.

## 2017-03-07 NOTE — Telephone Encounter (Signed)
Sent stat message to [facility] waiting to hear pt should have received the 02 yesterday Aaron Hendrix

## 2017-03-07 NOTE — Telephone Encounter (Signed)
Pt daughter would like a call to know when pt bipap titration is. Please advise

## 2017-03-08 ENCOUNTER — Emergency Department: Payer: Medicare Other

## 2017-03-08 ENCOUNTER — Encounter: Payer: Self-pay | Admitting: Emergency Medicine

## 2017-03-08 ENCOUNTER — Inpatient Hospital Stay
Admission: EM | Admit: 2017-03-08 | Discharge: 2017-03-11 | DRG: 189 | Disposition: A | Discharge status: Home Health | Payer: Medicare Other | Attending: Internal Medicine | Admitting: Internal Medicine

## 2017-03-08 DIAGNOSIS — Z888 Allergy status to other drugs, medicaments and biological substances status: Secondary | ICD-10-CM | POA: Diagnosis not present

## 2017-03-08 DIAGNOSIS — Z96641 Presence of right artificial hip joint: Secondary | ICD-10-CM | POA: Diagnosis present

## 2017-03-08 DIAGNOSIS — F0390 Unspecified dementia without behavioral disturbance: Secondary | ICD-10-CM | POA: Diagnosis present

## 2017-03-08 DIAGNOSIS — G4733 Obstructive sleep apnea (adult) (pediatric): Secondary | ICD-10-CM | POA: Diagnosis present

## 2017-03-08 DIAGNOSIS — R200 Anesthesia of skin: Secondary | ICD-10-CM | POA: Diagnosis present

## 2017-03-08 DIAGNOSIS — E559 Vitamin D deficiency, unspecified: Secondary | ICD-10-CM | POA: Diagnosis present

## 2017-03-08 DIAGNOSIS — Z8711 Personal history of peptic ulcer disease: Secondary | ICD-10-CM

## 2017-03-08 DIAGNOSIS — I10 Essential (primary) hypertension: Secondary | ICD-10-CM | POA: Diagnosis present

## 2017-03-08 DIAGNOSIS — J9621 Acute and chronic respiratory failure with hypoxia: Secondary | ICD-10-CM | POA: Diagnosis present

## 2017-03-08 DIAGNOSIS — E039 Hypothyroidism, unspecified: Secondary | ICD-10-CM | POA: Diagnosis present

## 2017-03-08 DIAGNOSIS — R531 Weakness: Secondary | ICD-10-CM | POA: Diagnosis present

## 2017-03-08 DIAGNOSIS — Z7189 Other specified counseling: Secondary | ICD-10-CM | POA: Diagnosis not present

## 2017-03-08 DIAGNOSIS — Z9119 Patient's noncompliance with other medical treatment and regimen: Secondary | ICD-10-CM | POA: Diagnosis not present

## 2017-03-08 DIAGNOSIS — N400 Enlarged prostate without lower urinary tract symptoms: Secondary | ICD-10-CM | POA: Diagnosis present

## 2017-03-08 DIAGNOSIS — J9622 Acute and chronic respiratory failure with hypercapnia: Principal | ICD-10-CM | POA: Diagnosis present

## 2017-03-08 DIAGNOSIS — R413 Other amnesia: Secondary | ICD-10-CM | POA: Diagnosis present

## 2017-03-08 DIAGNOSIS — Z87891 Personal history of nicotine dependence: Secondary | ICD-10-CM

## 2017-03-08 DIAGNOSIS — R410 Disorientation, unspecified: Secondary | ICD-10-CM

## 2017-03-08 DIAGNOSIS — Z515 Encounter for palliative care: Secondary | ICD-10-CM | POA: Diagnosis not present

## 2017-03-08 DIAGNOSIS — K219 Gastro-esophageal reflux disease without esophagitis: Secondary | ICD-10-CM | POA: Diagnosis present

## 2017-03-08 DIAGNOSIS — J441 Chronic obstructive pulmonary disease with (acute) exacerbation: Secondary | ICD-10-CM | POA: Diagnosis present

## 2017-03-08 DIAGNOSIS — E1142 Type 2 diabetes mellitus with diabetic polyneuropathy: Secondary | ICD-10-CM | POA: Diagnosis present

## 2017-03-08 DIAGNOSIS — Z79899 Other long term (current) drug therapy: Secondary | ICD-10-CM

## 2017-03-08 DIAGNOSIS — E785 Hyperlipidemia, unspecified: Secondary | ICD-10-CM | POA: Diagnosis present

## 2017-03-08 DIAGNOSIS — M1990 Unspecified osteoarthritis, unspecified site: Secondary | ICD-10-CM | POA: Diagnosis present

## 2017-03-08 DIAGNOSIS — R0689 Other abnormalities of breathing: Secondary | ICD-10-CM

## 2017-03-08 DIAGNOSIS — G9349 Other encephalopathy: Secondary | ICD-10-CM | POA: Diagnosis present

## 2017-03-08 DIAGNOSIS — G250 Essential tremor: Secondary | ICD-10-CM | POA: Diagnosis present

## 2017-03-08 DIAGNOSIS — G252 Other specified forms of tremor: Secondary | ICD-10-CM | POA: Diagnosis present

## 2017-03-08 DIAGNOSIS — R4182 Altered mental status, unspecified: Secondary | ICD-10-CM

## 2017-03-08 DIAGNOSIS — M47812 Spondylosis without myelopathy or radiculopathy, cervical region: Secondary | ICD-10-CM | POA: Diagnosis present

## 2017-03-08 LAB — COMPREHENSIVE METABOLIC PANEL
ALT: 9 U/L — ABNORMAL LOW (ref 17–63)
AST: 18 U/L (ref 15–41)
Albumin: 3.3 g/dL — ABNORMAL LOW (ref 3.5–5.0)
Alkaline Phosphatase: 54 U/L (ref 38–126)
Anion gap: 8 (ref 5–15)
BUN: 23 mg/dL — ABNORMAL HIGH (ref 6–20)
CHLORIDE: 89 mmol/L — AB (ref 101–111)
CO2: 37 mmol/L — ABNORMAL HIGH (ref 22–32)
CREATININE: 1.35 mg/dL — AB (ref 0.61–1.24)
Calcium: 9 mg/dL (ref 8.9–10.3)
GFR, EST AFRICAN AMERICAN: 54 mL/min — AB (ref >60)
GFR, EST NON AFRICAN AMERICAN: 47 mL/min — AB (ref >60)
Glucose, Bld: 180 mg/dL — ABNORMAL HIGH (ref 65–99)
Potassium: 4.5 mmol/L (ref 3.5–5.1)
Sodium: 134 mmol/L — ABNORMAL LOW (ref 135–145)
Total Bilirubin: 0.5 mg/dL (ref 0.3–1.2)
Total Protein: 6.6 g/dL (ref 6.5–8.1)

## 2017-03-08 LAB — MRSA PCR SCREENING: MRSA by PCR: NEGATIVE

## 2017-03-08 LAB — BLOOD GAS, ARTERIAL
ACID-BASE EXCESS: 11 mmol/L — AB (ref 0.0–2.0)
Allens test (pass/fail): POSITIVE — AB
Allens test (pass/fail): POSITIVE — AB
BICARBONATE: 40.9 mmol/L — AB (ref 20.0–28.0)
Delivery systems: POSITIVE
Expiratory PAP: 5
FIO2: 0.28
FIO2: 0.21
Inspiratory PAP: 18
O2 SAT: 76.1 %
PATIENT TEMPERATURE: 37
PATIENT TEMPERATURE: 37
PCO2 ART: 120 mmHg — AB (ref 32.0–48.0)
PCO2 ART: 85 mmHg — AB (ref 32.0–48.0)
PH ART: 7.11 — AB (ref 7.350–7.450)
PH ART: 7.29 — AB (ref 7.350–7.450)
RATE: 8 resp/min
pO2, Arterial: 328 mmHg — ABNORMAL HIGH (ref 83.0–108.0)
pO2, Arterial: 46 mmHg — ABNORMAL LOW (ref 83.0–108.0)

## 2017-03-08 LAB — GLUCOSE, CAPILLARY
GLUCOSE-CAPILLARY: 72 mg/dL (ref 65–99)
Glucose-Capillary: 92 mg/dL (ref 65–99)
Glucose-Capillary: 119 mg/dL — ABNORMAL HIGH (ref 65–99)

## 2017-03-08 LAB — CBC
HCT: 37.6 % — ABNORMAL LOW (ref 40.0–52.0)
Hemoglobin: 12.2 g/dL — ABNORMAL LOW (ref 13.0–18.0)
MCH: 30.6 pg (ref 26.0–34.0)
MCHC: 32.3 g/dL (ref 32.0–36.0)
MCV: 94.8 fL (ref 80.0–100.0)
PLATELETS: 253 10*3/uL (ref 150–440)
RBC: 3.97 MIL/uL — ABNORMAL LOW (ref 4.40–5.90)
RDW: 14.2 % (ref 11.5–14.5)
WBC: 7.8 10*3/uL (ref 3.8–10.6)

## 2017-03-08 LAB — URINALYSIS, COMPLETE (UACMP) WITH MICROSCOPIC
Bacteria, UA: NONE SEEN
Bilirubin Urine: NEGATIVE
Glucose, UA: NEGATIVE mg/dL
HGB URINE DIPSTICK: NEGATIVE
Ketones, ur: NEGATIVE mg/dL
LEUKOCYTES UA: NEGATIVE
Nitrite: NEGATIVE
Protein, ur: NEGATIVE mg/dL
SPECIFIC GRAVITY, URINE: 1.012 (ref 1.005–1.030)
pH: 5 (ref 5.0–8.0)

## 2017-03-08 LAB — TROPONIN I: Troponin I: <0.03 ng/mL (ref <0.03)

## 2017-03-08 LAB — LACTIC ACID, PLASMA: LACTIC ACID, VENOUS: 0.8 mmol/L (ref 0.5–1.9)

## 2017-03-08 MED ORDER — ENOXAPARIN SODIUM 40 MG/0.4ML ~~LOC~~ SOLN
40.0000 mg | SUBCUTANEOUS | Status: DC
  Administered 2017-03-08 – 2017-03-10 (×3): 40 mg via SUBCUTANEOUS
  Filled 2017-03-08 (×3): qty 0.4

## 2017-03-08 MED ORDER — METHYLPREDNISOLONE SODIUM SUCC 125 MG IJ SOLR
60.0000 mg | INTRAMUSCULAR | Status: DC
  Administered 2017-03-08 – 2017-03-09 (×2): 60 mg via INTRAVENOUS
  Filled 2017-03-08 (×2): qty 2

## 2017-03-08 MED ORDER — INSULIN ASPART 100 UNIT/ML ~~LOC~~ SOLN
0.0000 [IU] | Freq: Three times a day (TID) | SUBCUTANEOUS | Status: DC
Start: 2017-03-08 — End: 2017-03-11
  Administered 2017-03-09: 1 [IU] via SUBCUTANEOUS
  Administered 2017-03-09: 2 [IU] via SUBCUTANEOUS
  Administered 2017-03-10 (×2): 1 [IU] via SUBCUTANEOUS
  Filled 2017-03-08 (×4): qty 1

## 2017-03-08 MED ORDER — ALBUTEROL SULFATE (2.5 MG/3ML) 0.083% IN NEBU: 2.5000 mg | INHALATION_SOLUTION | RESPIRATORY_TRACT | Status: DC | PRN

## 2017-03-08 MED ORDER — IPRATROPIUM-ALBUTEROL 0.5-2.5 (3) MG/3ML IN SOLN
3.0000 mL | RESPIRATORY_TRACT | Status: DC
  Administered 2017-03-08 – 2017-03-10 (×10): 3 mL via RESPIRATORY_TRACT
  Filled 2017-03-08 (×9): qty 3

## 2017-03-08 MED ORDER — ONDANSETRON HCL 4 MG/2ML IJ SOLN
4.0000 mg | Freq: Four times a day (QID) | INTRAMUSCULAR | Status: DC | PRN
  Administered 2017-03-08: 4 mg via INTRAVENOUS
  Filled 2017-03-08: qty 2

## 2017-03-08 MED ORDER — LEVOTHYROXINE SODIUM 50 MCG PO TABS
50.0000 ug | ORAL_TABLET | Freq: Every day | ORAL | Status: DC
  Administered 2017-03-09 – 2017-03-11 (×3): 50 ug via ORAL
  Filled 2017-03-08 (×3): qty 1

## 2017-03-08 MED ORDER — PANTOPRAZOLE SODIUM 40 MG PO TBEC
40.0000 mg | DELAYED_RELEASE_TABLET | Freq: Every day | ORAL | Status: DC
Start: 2017-03-08 — End: 2017-03-11
  Administered 2017-03-09 – 2017-03-11 (×3): 40 mg via ORAL
  Filled 2017-03-08 (×3): qty 1

## 2017-03-08 MED ORDER — ACETAMINOPHEN 650 MG RE SUPP: 650.0000 mg | Freq: Four times a day (QID) | RECTAL | Status: DC | PRN

## 2017-03-08 MED ORDER — IPRATROPIUM-ALBUTEROL 0.5-2.5 (3) MG/3ML IN SOLN: 3.0000 mL | Freq: Three times a day (TID) | RESPIRATORY_TRACT | Status: DC | PRN

## 2017-03-08 MED ORDER — TAMSULOSIN HCL 0.4 MG PO CAPS
0.4000 mg | ORAL_CAPSULE | Freq: Every day | ORAL | Status: DC
Start: 2017-03-08 — End: 2017-03-11
  Administered 2017-03-09 – 2017-03-11 (×3): 0.4 mg via ORAL
  Filled 2017-03-08 (×3): qty 1

## 2017-03-08 MED ORDER — VITAMIN D3 1.25 MG (50000 UT) PO CAPS
50000.0000 [IU] | ORAL_CAPSULE | ORAL | Status: DC
  Filled 2017-03-08: qty 1

## 2017-03-08 MED ORDER — POLYETHYLENE GLYCOL 3350 17 G PO PACK: 17.0000 g | PACK | Freq: Every day | ORAL | Status: DC | PRN

## 2017-03-08 MED ORDER — FINASTERIDE 5 MG PO TABS
5.0000 mg | ORAL_TABLET | Freq: Every day | ORAL | Status: DC
Start: 2017-03-08 — End: 2017-03-11
  Administered 2017-03-09 – 2017-03-11 (×3): 5 mg via ORAL
  Filled 2017-03-08 (×3): qty 1

## 2017-03-08 MED ORDER — SODIUM CHLORIDE 0.9 % IV SOLN
INTRAVENOUS | Status: DC
  Administered 2017-03-08: 18:00:00 via INTRAVENOUS
  Administered 2017-03-09: 50 mL/h via INTRAVENOUS

## 2017-03-08 MED ORDER — ACETAMINOPHEN 325 MG PO TABS: 650.0000 mg | ORAL_TABLET | Freq: Four times a day (QID) | ORAL | Status: DC | PRN

## 2017-03-08 MED ORDER — ONDANSETRON HCL 4 MG PO TABS: 4.0000 mg | ORAL_TABLET | Freq: Four times a day (QID) | ORAL | Status: DC | PRN

## 2017-03-08 NOTE — Consult Note (Signed)
[facility] Consultation      Assessment and Plan:  -Acute COPD exacerbation, with acute chronic bronchitis, leading to acute on chronic hypercapnic respiratory failure. -Obstructive sleep apnea, poor compliance with home CPAP therapy. -Altered mental status/acute encephalopathy secondary to hypercapnia. CT head negative.  -Continue BiPAP, recheck ABG in a.m. -We will see if we can facilitate getting the patient on home BiPAP. -Plan discussed with family at bedside.  Date: 03/08/2017  MRN# [id number] Aaron Hendrix 1930-06-15  Referring Physician: Dr. Posey Pronto for [facility].   Aaron Hendrix is a 83 y.o. old male seen in consultation for chief complaint of:    Chief Complaint  Patient presents with  . Altered Mental Status    HPI:   Pt is an 83 yo male with history of COPD, recent admit in March of this year and previous admissions for AECOPD including an intubation. He has a known history of hypercapnic resp failure with PCO2 as high as 162. He was seen by Dr. Alva Garnet in the office on 03/06/17; was changed from breo to anoro. It was thought he may benefit from change from CPAP to bipap, but had to undergo a titration study per insurance requirements.  Advanced directives were also discussed at that time, and the patient was referred to [place].  Subsequently it was noted that the patient was confused at home, EMS found pt with sat of 70%.  In the ED ABG showed 7.11/120/328/??. Ct head for AMS showed no acute abnormality. I personally reviewed CXR images which was unremarkable.   PMHX:   Past Medical History:  Diagnosis Date  . Carpal tunnel syndrome   . COPD with asthma ([place])   . Diabetes mellitus    Diet control   . DJD (degenerative joint disease), cervical   . Duodenal ulcer, with partial obstruction 08/10/2012  . Essential and other specified forms of tremor 12/16/2013  . Hyperlipidemia   . Hypertension    no medicine needed  .  IBS (irritable bowel syndrome)   . Internal hemorrhoids   . Memory deficit 12/16/2013  . Numbness and tingling in right hand    started 2 yeas ago  . Peptic ulcer   . Personal history of colonic polyps    adenomatous  . Polyneuropathy in diabetes(357.2)   . Tremor, essential   . Vitamin D deficiency    Surgical Hx:  Past Surgical History:  Procedure Laterality Date  . ANAL FISSURE REPAIR    . ANKLE SURGERY Right    right- pins placed in  . COLONOSCOPY W/ BIOPSIES AND POLYPECTOMY  8/03, 6/05, 7/09, 9/10   internal hemorrhoids, tubular adenomas, mucosa & lymphoid nodules  . INGUINAL HERNIA REPAIR Right   . TOTAL HIP ARTHROPLASTY Right 11/13/2012   Procedure: TOTAL HIP ARTHROPLASTY ANTERIOR APPROACH;  Surgeon: Mcarthur Rossetti, MD;  Location: [place];  Service: Orthopedics;  Laterality: Right;  Right total hip arthroplasty  . UPPER GASTROINTESTINAL ENDOSCOPY  3/05, 7/09, 9/10,2013   gastritis, duodenitis   Family Hx:  Family History  Problem Relation Age of Onset  . Thyroid cancer Daughter 24  . Diabetes Son   . Urolithiasis Son   . Leukemia Maternal Aunt   . Colon cancer Neg Hx   . Esophageal cancer Neg Hx   . Rectal cancer Neg Hx   . Stomach cancer Neg Hx   . Prostate cancer Neg Hx   . Kidney disease Neg Hx   . Kidney cancer Neg Hx  Social Hx:   Social History  Substance Use Topics  . Smoking status: Former Smoker    Types: Cigarettes    Quit date: 07/02/1963  . Smokeless tobacco: Never Used  . Alcohol use No   Medication:    Current Facility-Administered Medications:  .  0.9 %  sodium chloride infusion, , Intravenous, Continuous, Fritzi Mandes, MD .  acetaminophen (TYLENOL) tablet 650 mg, 650 mg, Oral, Q6H PRN **OR** acetaminophen (TYLENOL) suppository 650 mg, 650 mg, Rectal, Q6H PRN, Fritzi Mandes, MD .  albuterol (PROVENTIL) (2.5 MG/3ML) 0.083% nebulizer solution 2.5 mg, 2.5 mg, Nebulization, Q2H PRN, Fritzi Mandes, MD .  enoxaparin (LOVENOX) injection 40 mg, 40  mg, Subcutaneous, Q24H, Fritzi Mandes, MD .  finasteride (PROSCAR) tablet 5 mg, 5 mg, Oral, Daily, Fritzi Mandes, MD .  insulin aspart (novoLOG) injection 0-9 Units, 0-9 Units, Subcutaneous, TID WC, Patel, Sona, MD .  ipratropium-albuterol (DUONEB) 0.5-2.5 (3) MG/3ML nebulizer solution 3 mL, 3 mL, Nebulization, TID PRN, Fritzi Mandes, MD .  levothyroxine (SYNTHROID, LEVOTHROID) tablet 50 mcg, 50 mcg, Oral, QAC breakfast, Fritzi Mandes, MD .  methylPREDNISolone sodium succinate (SOLU-MEDROL) 125 mg/2 mL injection 60 mg, 60 mg, Intravenous, Q24H, Posey Pronto, Sona, MD .  ondansetron (ZOFRAN) tablet 4 mg, 4 mg, Oral, Q6H PRN **OR** ondansetron (ZOFRAN) injection 4 mg, 4 mg, Intravenous, Q6H PRN, Fritzi Mandes, MD .  pantoprazole (PROTONIX) EC tablet 40 mg, 40 mg, Oral, Daily, Posey Pronto, Sona, MD .  polyethylene glycol (MIRALAX / GLYCOLAX) packet 17 g, 17 g, Oral, Daily PRN, Fritzi Mandes, MD .  tamsulosin (FLOMAX) capsule 0.4 mg, 0.4 mg, Oral, Daily, Fritzi Mandes, MD .  Vitamin D3 CAPS 50,000 Units, 50,000 Units, Oral, Weekly, Fritzi Mandes, MD   Allergies:  Fexofenadine; Quinapril hcl; and Latex  Review of Systems: Gen:  Denies  fever, sweats, chills HEENT: Denies blurred vision, double vision. bleeds, sore throat Cvc:  No dizziness, chest pain. Resp:   Denies cough or sputum production, shortness of breath Gi: Denies swallowing difficulty, stomach pain. Gu:  Denies bladder incontinence, burning urine Ext:   No Joint pain, stiffness. Skin: No skin rash,  hives  Endoc:  No polyuria, polydipsia. Psych: No depression, insomnia. Other:  All other systems were reviewed with the patient and were negative other that what is mentioned in the HPI.   Physical Examination:   VS: BP 128/62 (BP Location: Left Arm)   Pulse 70   Temp 97.9 F (36.6 C) (Axillary)   Resp (!) 9   Ht 5\' 4"  (1.626 m)   Wt 62.8 kg (138 lb 7.2 oz)   SpO2 96%   BMI 23.76 kg/m   General Appearance: No distress  Neuro:without focal findings,   speech normal,  HEENT: PERRLA, EOM intact.   Pulmonary: normal breath sounds, No wheezing.  CardiovascularNormal S1,S2.  No m/r/g.   Abdomen: Benign, Soft, non-tender. Renal:  No costovertebral tenderness  GU:  No performed at this time. Endoc: No evident thyromegaly, no signs of acromegaly. Skin:   warm, no rashes, no ecchymosis  Extremities: normal, no cyanosis, clubbing.  Other findings:    LABORATORY PANEL:   CBC  Recent Labs Lab 03/08/17 0940  WBC 7.8  HGB 12.2*  HCT 37.6*  PLT 253   ------------------------------------------------------------------------------------------------------------------  Chemistries   Recent Labs Lab 03/08/17 0940  NA 134*  K 4.5  CL 89*  CO2 37*  GLUCOSE 180*  BUN 23*  CREATININE 1.35*  CALCIUM 9.0  AST 18  ALT 9*  ALKPHOS 54  BILITOT 0.5   ------------------------------------------------------------------------------------------------------------------  Cardiac Enzymes  Recent Labs Lab 03/08/17 0940  TROPONINI <0.03   ------------------------------------------------------------  RADIOLOGY:  Ct Head Wo Contrast  Result Date: 03/08/2017 CLINICAL DATA:  Altered mental status for several hours EXAM: CT HEAD WITHOUT CONTRAST TECHNIQUE: Contiguous axial images were obtained from the base of the skull through the vertex without intravenous contrast. COMPARISON:  06/25/2015 FINDINGS: Brain: Diffuse atrophic changes and chronic white matter ischemic change is noted. No findings to suggest acute hemorrhage, acute infarction or space-occupying mass lesion are seen. Vascular: No hyperdense vessel or unexpected calcification. Skull: Normal. Negative for fracture or focal lesion. Sinuses/Orbits: No acute finding. Other: None. IMPRESSION: Chronic atrophic and ischemic changes without acute abnormality. Electronically Signed   By: Inez Catalina M.D.   On: 03/08/2017 10:13   Dg Chest Portable 1 View  Result Date: 03/08/2017 CLINICAL  DATA:  Altered mental status. History of hypertension, diabetes and COPD. EXAM: PORTABLE CHEST 1 VIEW COMPARISON:  Radiographs 12/23/2016.  CT 11/25/2016. FINDINGS: 0944 hour. Persistent suboptimal inspiration with resulting bibasilar atelectasis, similar to previous study. There is no confluent airspace opacity, edema, pleural effusion or pneumothorax. The heart size and mediastinal contours are stable. No acute osseous findings are seen. Telemetry leads overlie the chest. IMPRESSION: Stable chest with mild bibasilar atelectasis. No acute cardiopulmonary process. Electronically Signed   By: Richardean Sale M.D.   On: 03/08/2017 09:59       Thank  you for the consultation and for allowing [facility] to assist in the care of your patient. Our recommendations are noted above.  Please contact us if we can be of further service.   Marda Stalker, MD.  Board Certified in Internal Medicine, Pulmonary Medicine, [place], and Sleep Medicine.  [facility] Office Number: [phone]  Patricia Pesa, M.D.  Merton Border, M.D  03/08/2017

## 2017-03-08 NOTE — ED Notes (Signed)
Attempted to call report, Network engineer informs RN that nurse will have to call back for report.

## 2017-03-08 NOTE — ED Notes (Signed)
reusumed care from valerie rn.  Pt on bipap.  Family with pt.  Pt waiting on admission bed.  nsr on monitor.  Iv in place

## 2017-03-08 NOTE — H&P (Signed)
[facility] [place] NAME: Aaron Hendrix    MR#:  [id number]  DATE OF BIRTH:  [birth year]  DATE OF ADMISSION:  03/08/2017  PRIMARY CARE PHYSICIAN: [name], MD   REQUESTING/REFERRING PHYSICIAN: Dr Vicente Males  CHIEF COMPLAINT:   Altered mental status/unresponsive. He should is not able to give any history or review of system. Discussed with patient's wife and son-in-law in the ER.  HISTORY OF PRESENT ILLNESS:  Aaron Hendrix  is a 83 y.o. male with a known history of COPD who is on BiPAP at home however is noncompliant, DJD, diabetes, hypertension comes to the emergency room after he was found confused and altered mental status with unresponsiveness later on by wife at home. Patient apparently has been noncompliant with his CPAP which was recently changed to BiPAP at home by pulmonary Dr. Rosita Fire. He was found to be obtunded/unresponsive in the ED. His CO2 was 121. Patient was emergently placed on BiPAP. Repeat ABG shows CO2 of 85 after 2 hours. Patient is now moaning and groaning to sternal rub and intermittently opens his eyes. He is being admitted with acute on chronic hypercarbic respiratory failure.  CODE STATUS discussed with patient's wife and son-in-law patient is a FULL CODE  PAST MEDICAL HISTORY:   Past Medical History:  Diagnosis Date  . Carpal tunnel syndrome   . COPD with asthma ([place])   . Diabetes mellitus    Diet control   . DJD (degenerative joint disease), cervical   . Duodenal ulcer, with partial obstruction 08/10/2012  . Essential and other specified forms of tremor 12/16/2013  . Hyperlipidemia   . Hypertension    no medicine needed  . IBS (irritable bowel syndrome)   . Internal hemorrhoids   . Memory deficit 12/16/2013  . Numbness and tingling in right hand    started 2 yeas ago  . Peptic ulcer   . Personal history of colonic polyps    adenomatous  . Polyneuropathy in diabetes(357.2)   . Tremor,  essential   . Vitamin D deficiency     PAST SURGICAL HISTOIRY:   Past Surgical History:  Procedure Laterality Date  . ANAL FISSURE REPAIR    . ANKLE SURGERY Right    right- pins placed in  . COLONOSCOPY W/ BIOPSIES AND POLYPECTOMY  8/03, 6/05, 7/09, 9/10   internal hemorrhoids, tubular adenomas, mucosa & lymphoid nodules  . INGUINAL HERNIA REPAIR Right   . TOTAL HIP ARTHROPLASTY Right 11/13/2012   Procedure: TOTAL HIP ARTHROPLASTY ANTERIOR APPROACH;  Surgeon: Mcarthur Rossetti, MD;  Location: [facility];  Service: Orthopedics;  Laterality: Right;  Right total hip arthroplasty  . UPPER GASTROINTESTINAL ENDOSCOPY  3/05, 7/09, 9/10,2013   gastritis, duodenitis    SOCIAL HISTORY:   Social History  Substance Use Topics  . Smoking status: Former Smoker    Types: Cigarettes    Quit date: 07/02/1963  . Smokeless tobacco: Never Used  . Alcohol use No    FAMILY HISTORY:   Family History  Problem Relation Age of Onset  . Thyroid cancer Daughter 61  . Diabetes Son   . Urolithiasis Son   . Leukemia Maternal Aunt   . Colon cancer Neg Hx   . Esophageal cancer Neg Hx   . Rectal cancer Neg Hx   . Stomach cancer Neg Hx   . Prostate cancer Neg Hx   . Kidney disease Neg Hx   . Kidney cancer Neg Hx     DRUG  ALLERGIES:   Allergies  Allergen Reactions  . Fexofenadine Other (See Comments)  . Quinapril Hcl Other (See Comments)    Unknown   . Latex Rash    REVIEW OF SYSTEMS:  Review of Systems  Unable to perform ROS: Mental status change     MEDICATIONS AT HOME:   Prior to Admission medications   Medication Sig Start Date End Date Taking? Authorizing Provider  albuterol (PROAIR HFA) 108 (90 Base) MCG/ACT inhaler Inhale 1-2 puffs into the lungs every 6 (six) hours as needed for wheezing or shortness of breath. 07/07/16  Yes [name], MD  Cholecalciferol (VITAMIN D3) 50000 units CAPS Take 50,000 Units by mouth once a week.   Yes [provider]  finasteride  (PROSCAR) 5 MG tablet Take 1 tablet (5 mg total) by mouth daily. 11/14/16  Yes [name], [name], PA-C  furosemide (LASIX) 20 MG tablet Take 1 tablet (20 mg total) by mouth daily. Patient taking differently: Take 40 mg by mouth daily.  07/22/15  Yes Max Sane, MD  ipratropium-albuterol (DUONEB) 0.5-2.5 (3) MG/3ML SOLN Take 3 mLs by nebulization 3 (three) times daily. Patient taking differently: Take 3 mLs by nebulization 3 (three) times daily.  11/27/16  Yes Dustin Flock, MD  levothyroxine (SYNTHROID, LEVOTHROID) 50 MCG tablet Take 1 tablet (50 mcg total) by mouth daily before breakfast. 12/02/16  Yes [name], MD  losartan (COZAAR) 50 MG tablet Take 1 tablet (50 mg total) by mouth daily. 12/02/16  Yes [name], MD  pantoprazole (PROTONIX) 40 MG tablet Take 1 tablet (40 mg total) by mouth daily. 12/02/16  Yes [name], MD  saccharomyces boulardii (FLORASTOR) 250 MG capsule Take 250 mg by mouth as needed.    Yes [provider]  tamsulosin (FLOMAX) 0.4 MG CAPS capsule Take 1 capsule (0.4 mg total) by mouth daily. 11/14/16  Yes [name], [name], PA-C  umeclidinium-vilanterol (ANORO ELLIPTA) 62.5-25 MCG/INH AEPB Inhale 1 puff into the lungs daily. 03/06/17  Yes [name], MD      VITAL SIGNS:  Blood pressure (!) 113/59, pulse 77, temperature 98.6 F (37 C), temperature source Oral, resp. rate (!) 25, height 5\' 4"  (1.626 m), weight 65.3 kg (144 lb), SpO2 97 %.  PHYSICAL EXAMINATION:  GENERAL:  83 y.o.-year-old patient lying in the bed with no acute distress. On BiPAP. EYES: Pupils equal, round, reactive to light and accommodation. No scleral icterus. Extraocular muscles intact.  HEENT: Head atraumatic, normocephalic. Oropharynx and nasopharynx clear.  NECK:  Supple, no jugular venous distention. No thyroid enlargement, no tenderness.  LUNGS: Normal breath sounds bilaterally, no wheezing, rales,rhonchi or crepitation. No use of accessory muscles of respiration.  Currently on BiPAP CARDIOVASCULAR: S1, S2 normal. No murmurs, rubs, or gallops.  ABDOMEN: Soft, nontender, nondistended. Bowel sounds present. No organomegaly or mass.  EXTREMITIES: No pedal edema, cyanosis, or clubbing.  NEUROLOGIC: Unable to assess since patient has altered mental status PSYCHIATRIC: Patient is lethargic on BiPAP however does respond to sternal rub SKIN: No obvious rash, lesion, or ulcer.   LABORATORY PANEL:   CBC  Recent Labs Lab 03/08/17 0940  WBC 7.8  HGB 12.2*  HCT 37.6*  PLT 253   ------------------------------------------------------------------------------------------------------------------  Chemistries   Recent Labs Lab 03/08/17 0940  NA 134*  K 4.5  CL 89*  CO2 37*  GLUCOSE 180*  BUN 23*  CREATININE 1.35*  CALCIUM 9.0  AST 18  ALT 9*  ALKPHOS 54  BILITOT 0.5   ------------------------------------------------------------------------------------------------------------------  Cardiac  Enzymes  Recent Labs Lab 03/08/17 0940  TROPONINI <0.03   ------------------------------------------------------------------------------------------------------------------  RADIOLOGY:  Ct Head Wo Contrast  Result Date: 03/08/2017 CLINICAL DATA:  Altered mental status for several hours EXAM: CT HEAD WITHOUT CONTRAST TECHNIQUE: Contiguous axial images were obtained from the base of the skull through the vertex without intravenous contrast. COMPARISON:  06/25/2015 FINDINGS: Brain: Diffuse atrophic changes and chronic white matter ischemic change is noted. No findings to suggest acute hemorrhage, acute infarction or space-occupying mass lesion are seen. Vascular: No hyperdense vessel or unexpected calcification. Skull: Normal. Negative for fracture or focal lesion. Sinuses/Orbits: No acute finding. Other: None. IMPRESSION: Chronic atrophic and ischemic changes without acute abnormality. Electronically Signed   By: Inez Catalina M.D.   On: 03/08/2017 10:13    Dg Chest Portable 1 View  Result Date: 03/08/2017 CLINICAL DATA:  Altered mental status. History of hypertension, diabetes and COPD. EXAM: PORTABLE CHEST 1 VIEW COMPARISON:  Radiographs 12/23/2016.  CT 11/25/2016. FINDINGS: 0944 hour. Persistent suboptimal inspiration with resulting bibasilar atelectasis, similar to previous study. There is no confluent airspace opacity, edema, pleural effusion or pneumothorax. The heart size and mediastinal contours are stable. No acute osseous findings are seen. Telemetry leads overlie the chest. IMPRESSION: Stable chest with mild bibasilar atelectasis. No acute cardiopulmonary process. Electronically Signed   By: Richardean Sale M.D.   On: 03/08/2017 09:59    EKG:    IMPRESSION AND PLAN:   Davone Shinault  is a 83 y.o. male with a known history of COPD who is on BiPAP at home however is noncompliant, DJD, diabetes, hypertension comes to the emergency room after he was found confused and altered mental status with unresponsiveness later on by wife at home. His CO2 on initial ABG was 121  1. Acute on chronic hypoxic hypercarbic respiratory failure secondary to COPD/emphysema flare. -Admit to ICU -Continue BiPAP -Case discussed with Dr. Samule Ohm who will see patient in consultation -IV Solu-Medrol 60 mg daily -Nebulizer, inhaler -Patient is a full code  2. Diabetes sliding scale insulin  3. Hypertension resume home meds once able to swallow and mentation is improved  4. GERD continue PPI  5. DVT prophylaxis Lovenox  All the records are reviewed and case discussed with ED provider. Management plans discussed with the patient, family and they are in agreement.  CODE STATUS: Full discussed with wife and son-in-law  TOTAL  critical TIME TAKING CARE OF THIS PATIENT: 50  minutes.    Kamrin Sibley M.D on 03/08/2017 at 1:48 PM  Between 7am to 6pm - Pager - [phone]  After 6pm go to [email or web address] - password [email or web address] [facility]  [facility]   Office  [phone]  CC: Primary care physician; [name], MD

## 2017-03-08 NOTE — ED Notes (Signed)
Upper denture removed and given to wife

## 2017-03-08 NOTE — ED Provider Notes (Addendum)
[facility] Emergency Department Provider Note  Time seen: 9:41 AM  I have reviewed the triage vital signs and the nursing notes.   HISTORY  Chief Complaint Altered Mental Status    HPI Aaron Hendrix is a 83 y.o. male with a past medical history of COPD, diabetes, hypertension, hyperlipidemia, presents to the emergency department with altered mental status.According to EMS the patient is from home, normally alert and oriented 4. Family noted since last night he has been somewhat confused, this morning was only minimally responsive so they brought him to the emergency department via EMS for evaluation. EMS states initially the patient had a room air saturation of 70%, he is prescribed 1 L of oxygen for home use however they state he was not on oxygen. Patient at times would follow commands such as grip strength, will at times answer questions such as his name but is for the most part unresponsive to verbal stimuli but is responsive to painful stimuli. Upon arrival to the emergency department the patient will occasionally open his eyes, states he is cold, however for the most part does not answer questions or follow commands. Currently awaiting family arrival for further history.  Past Medical History:  Diagnosis Date  . Carpal tunnel syndrome   . COPD with asthma ([place])   . Diabetes mellitus    Diet control   . DJD (degenerative joint disease), cervical   . Duodenal ulcer, with partial obstruction 08/10/2012  . Essential and other specified forms of tremor 12/16/2013  . Hyperlipidemia   . Hypertension    no medicine needed  . IBS (irritable bowel syndrome)   . Internal hemorrhoids   . Memory deficit 12/16/2013  . Numbness and tingling in right hand    started 2 yeas ago  . Peptic ulcer   . Personal history of colonic polyps    adenomatous  . Polyneuropathy in diabetes(357.2)   . Tremor, essential   . Vitamin D deficiency     Patient Active Problem  List   Diagnosis Date Noted  . Hypoxia 11/25/2016  . COPD exacerbation ([place]) 11/25/2016  . Rash 01/18/2016  . Nocturia 08/11/2015  . BPH with obstruction/lower urinary tract symptoms 08/11/2015  . Hematemesis 07/29/2015  . Paralytic ileus ([place]) 07/29/2015  . Lower extremity edema 07/16/2015  . Increased urinary frequency 07/16/2015  . COPD (chronic obstructive pulmonary disease) ([place]) 07/16/2015  . Malnutrition of moderate degree ([employer]) 07/02/2015  . Community acquired pneumonia   . Hyponatremia 06/25/2015  . Skin lesion 02/16/2015  . Health care maintenance 02/16/2015  . Elbow abrasion 09/27/2014  . Hypothyroidism 09/27/2014  . Chronic constipation 09/01/2014  . Trigger thumb 06/22/2014  . Abdominal pain 06/22/2014  . Personal history of other specified conditions 02/20/2014  . Decreased anal sphincter tone 02/19/2014  . Other specified symptoms and signs involving the digestive system and abdomen 02/19/2014  . Diabetes mellitus ([place]) 02/19/2014  . Farts 02/19/2014  . Memory deficit 12/16/2013  . Essential and other specified forms of tremor 12/16/2013  . Amnesia 12/16/2013  . Diarrhea 10/24/2013  . Loss of weight 09/10/2013  . Periumbilical abdominal pain 09/10/2013  . Pain in lower limb 07/01/2013  . Dermatophytosis of foot 07/01/2013  . Degenerative arthritis of hip 11/13/2012  . Osteoarthritis of hip 11/13/2012  . DU (duodenal ulcer) 08/22/2012  . Nausea 08/10/2012  . Hypertension 05/25/2011  . Diabetes mellitus, type II ([place]) 05/25/2011  . Irritable bowel syndrome 03/31/2008  . History of colonic polyps 03/31/2008  .  Adaptive colitis 03/31/2008    Past Surgical History:  Procedure Laterality Date  . ANAL FISSURE REPAIR    . ANKLE SURGERY Right    right- pins placed in  . COLONOSCOPY W/ BIOPSIES AND POLYPECTOMY  8/03, 6/05, 7/09, 9/10   internal hemorrhoids, tubular adenomas, mucosa & lymphoid nodules  . INGUINAL HERNIA REPAIR Right   . TOTAL HIP ARTHROPLASTY  Right 11/13/2012   Procedure: TOTAL HIP ARTHROPLASTY ANTERIOR APPROACH;  Surgeon: Mcarthur Rossetti, MD;  Location: [facility];  Service: Orthopedics;  Laterality: Right;  Right total hip arthroplasty  . UPPER GASTROINTESTINAL ENDOSCOPY  3/05, 7/09, 9/10,2013   gastritis, duodenitis    Prior to Admission medications   Medication Sig Start Date End Date Taking? Authorizing Provider  albuterol (PROAIR HFA) 108 (90 Base) MCG/ACT inhaler Inhale 1-2 puffs into the lungs every 6 (six) hours as needed for wheezing or shortness of breath. 07/07/16   [name], MD  finasteride (PROSCAR) 5 MG tablet Take 1 tablet (5 mg total) by mouth daily. 11/14/16   Zara Council A, PA-C  furosemide (LASIX) 20 MG tablet Take 1 tablet (20 mg total) by mouth daily. Patient taking differently: Take 40 mg by mouth daily.  07/22/15   Max Sane, MD  ipratropium-albuterol (DUONEB) 0.5-2.5 (3) MG/3ML SOLN Take 3 mLs by nebulization 3 (three) times daily. Patient taking differently: Take 3 mLs by nebulization 3 (three) times daily.  11/27/16   Dustin Flock, MD  levothyroxine (SYNTHROID, LEVOTHROID) 50 MCG tablet Take 1 tablet (50 mcg total) by mouth daily before breakfast. 12/02/16   [name], MD  losartan (COZAAR) 50 MG tablet Take 1 tablet (50 mg total) by mouth daily. 12/02/16   [name], MD  pantoprazole (PROTONIX) 40 MG tablet Take 1 tablet (40 mg total) by mouth daily. 12/02/16   [name], MD  saccharomyces boulardii (FLORASTOR) 250 MG capsule Take 250 mg by mouth as needed.     [provider]  tamsulosin (FLOMAX) 0.4 MG CAPS capsule Take 1 capsule (0.4 mg total) by mouth daily. 11/14/16   [name], [name], PA-C  umeclidinium-vilanterol (ANORO ELLIPTA) 62.5-25 MCG/INH AEPB Inhale 1 puff into the lungs daily. 03/06/17   [name], MD    Allergies  Allergen Reactions  . Fexofenadine Other (See Comments)  . Quinapril Hcl Other (See Comments)    Unknown   . Latex Rash     Family History  Problem Relation Age of Onset  . Thyroid cancer Daughter 27  . Diabetes Son   . Urolithiasis Son   . Leukemia Maternal Aunt   . Colon cancer Neg Hx   . Esophageal cancer Neg Hx   . Rectal cancer Neg Hx   . Stomach cancer Neg Hx   . Prostate cancer Neg Hx   . Kidney disease Neg Hx   . Kidney cancer Neg Hx     Social History Social History  Substance Use Topics  . Smoking status: Former Smoker    Types: Cigarettes    Quit date: 07/02/1963  . Smokeless tobacco: Never Used  . Alcohol use No    Review of Systems Unable to obtain an adequate review of systems secondary to altered mental status ____________________________________________   PHYSICAL EXAM:  VITAL SIGNS: ED Triage Vitals [03/08/17 0937]  Enc Vitals Group     BP      Pulse      Resp      Temp      Temp src  SpO2      Weight 144 lb (65.3 kg)     Height 5\' 4"  (1.626 m)     Head Circumference      Peak Flow      Pain Score      Pain Loc      Pain Edu?      Excl. in [place]?     Constitutional: Very somnolent will occasionally open eyes spontaneously. Will rarely but occasionally follow commands or answer a question after repeating multiple times. The accuracy is questionable. Eyes: Normal exam, 2-3 mm PERRL. ENT   Head: Normocephalic and atraumatic.   Mouth/Throat: Dry mucous membranes Cardiovascular: Normal rate, regular rhythm around 100 bpm. Respiratory: Normal respiratory effort without tachypnea nor retractions. Breath sounds are clear. No obvious wheezes rales or rhonchi. Gastrointestinal: Soft and nontender. No distention.   Musculoskeletal: Patient does appear to be moving all extremities spontaneously at times. Neurologic:  Patient is able to speak at times, overall is very somnolent. Does appear to move all extremities spontaneously will occasionally open eyes spontaneously. Responds very well to painful stimuli, withdraws from painful stimuli, however only rarely  will he respond to verbal stimuli. Skin:  Skin is warm, dry and intact.  Psychiatric: Altered mental status  ____________________________________________    EKG  EKG reviewed and interpreted by myself shows sinus tachycardia at 103 bpm, widened QRS, normal axis, largely normal intervals, nonspecific ST changes. No ST elevation.  ____________________________________________    RADIOLOGY  CT head negative Chest x-ray negative  ____________________________________________   INITIAL IMPRESSION / ASSESSMENT AND PLAN / ED COURSE  Pertinent labs & imaging results that were available during my care of the patient were reviewed by me and considered in my medical decision making (see chart for details).  The patient presents the emergency department for altered mental status. He is quite somnolent in the emergency department, does respond to pain and rarely to verbal stimuli. We will very rarely but occasionally will follow commands. We will check labs, chest x-ray, CT head.   Patient's labs are largely within normal limits. X-rays negative. CT head is negative. Family states the patient is supposed wear CPAP but has not worn it for the past 2 nights as they are trying to run the oxygen into the CPAP mask, but no one has set this up yet. Given the patient's somnolence with altered mental status, not wearing his CPAP we'll obtain an ABG to rule out hypercarbia.  Patient's PCO2 is 120 on ABG. We'll initiate BiPAP treatment and admit to the hospital.  We will recheck ABG approximately 10-15 minutes. If the patient does not become more responsive or if there is not an appreciable correction on the arterial blood gas we will proceed with intubation. I discussed this with the family and they're agreeable to this plan but would like to hold off until the repeat ABGs obtained.  CRITICAL CARE Performed by: Harvest Dark   Total critical care time: 45 minutes  Critical care time was  exclusive of separately billable procedures and treating other patients.  Critical care was necessary to treat or prevent imminent or life-threatening deterioration.  Critical care was time spent personally by me on the following activities: development of treatment plan with patient and/or surrogate as well as nursing, discussions with consultants, evaluation of patient's response to treatment, examination of patient, obtaining history from patient or surrogate, ordering and performing treatments and interventions, ordering and review of laboratory studies, ordering and review of radiographic studies, pulse oximetry  and re-evaluation of patient's condition.   ____________________________________________   FINAL CLINICAL IMPRESSION(S) / ED DIAGNOSES  Altered mental status  hypercarbia   Harvest Dark, MD 03/08/17 1146    Harvest Dark, MD 03/08/17 1233

## 2017-03-08 NOTE — Care Management Note (Signed)
Case Management Note  Patient Details  Name: Aaron Hendrix MRN: [id number] Date of Birth: 10-04-1929  Subjective/Objective:    Call from [place] with advanced to say they had been unable to set up home 02 for the patient due to lack of documentation from an MD office.                Action/Plan:   Expected Discharge Date:                  Expected Discharge Plan:     In-House Referral:     Discharge planning Services     Post Acute Care Choice:    Choice offered to:     DME Arranged:    DME Agency:     HH Arranged:    HH Agency:     Status of Service:     If discussed at [employer] of Stay Meetings, dates discussed:    Additional Comments:  Beau Fanny, RN 03/08/2017, 1:39 PM

## 2017-03-08 NOTE — Progress Notes (Signed)
Pt became extremely lethargic. Unresponsive to sternal rub and nail pressure. Not breathing over the bipap. All VSS. [facility] called. Stat ABG ordered. Pt became much more responsive when stuck. Opened eyes and responded to questions.

## 2017-03-08 NOTE — ED Triage Notes (Signed)
Pt arrived via EMS from home for reports of AMS that was discovered at approximately 0200 today. EMS reports pt oriented to self only, during transport SpO2 dropped to 70%, pt placed on O2 via [place], SpO2 increased to 90s. EMS reports VSS, CBG 220.

## 2017-03-08 NOTE — ED Notes (Signed)
Pt becoming more arousable  since bipap started.  Is mumbling and moving some.

## 2017-03-08 NOTE — Progress Notes (Signed)
Patient thrashing around in bed. Pulling and talking. Spoke with [place] NP. Will trial off bipap and continue to monitor

## 2017-03-08 NOTE — Progress Notes (Signed)
eLink Physician-Brief Progress Note Patient Name: Aaron Hendrix DOB: [birth year] MRN: [id number]   Date of Service  03/08/2017  HPI/Events of Note  ALOC - AECOPD on BiPAP. Sat = 93%, RR = 24 and HR = 82.  eICU Interventions   Will check ABG STAT.      Intervention Category Major Interventions: Change in mental status - evaluation and management  Sommer,Steven Cornelia Copa 03/08/2017, 6:35 PM%

## 2017-03-08 NOTE — ED Notes (Signed)
Remains to wait on inpatient bed. Family at bedside. NAD.

## 2017-03-08 NOTE — ED Notes (Signed)
Patient transported to CT 

## 2017-03-08 NOTE — Telephone Encounter (Signed)
Have given DS message from [facility] in regards to the note addendum and he states he will complete the note ASAP. Sent staff message to [facility] to make her aware. Nothing further needed.

## 2017-03-09 DIAGNOSIS — Z7189 Other specified counseling: Secondary | ICD-10-CM

## 2017-03-09 DIAGNOSIS — R0689 Other abnormalities of breathing: Secondary | ICD-10-CM

## 2017-03-09 DIAGNOSIS — Z515 Encounter for palliative care: Secondary | ICD-10-CM

## 2017-03-09 LAB — CBC
HCT: 37.4 % — ABNORMAL LOW (ref 40.0–52.0)
HEMOGLOBIN: 11.9 g/dL — AB (ref 13.0–18.0)
MCH: 30.6 pg (ref 26.0–34.0)
MCHC: 31.8 g/dL — ABNORMAL LOW (ref 32.0–36.0)
MCV: 96 fL (ref 80.0–100.0)
Platelets: 254 10*3/uL (ref 150–440)
RBC: 3.9 MIL/uL — AB (ref 4.40–5.90)
RDW: 13.7 % (ref 11.5–14.5)
WBC: 5.2 10*3/uL (ref 3.8–10.6)

## 2017-03-09 LAB — BASIC METABOLIC PANEL
ANION GAP: 6 (ref 5–15)
BUN: 23 mg/dL — AB (ref 6–20)
CALCIUM: 8.7 mg/dL — AB (ref 8.9–10.3)
CHLORIDE: 93 mmol/L — AB (ref 101–111)
CO2: 36 mmol/L — ABNORMAL HIGH (ref 22–32)
Creatinine, Ser: 1.1 mg/dL (ref 0.61–1.24)
GFR calc Af Amer: >60 mL/min (ref >60)
GFR, EST NON AFRICAN AMERICAN: 60 mL/min — AB (ref >60)
GLUCOSE: 141 mg/dL — AB (ref 65–99)
POTASSIUM: 4.3 mmol/L (ref 3.5–5.1)
Sodium: 135 mmol/L (ref 135–145)

## 2017-03-09 LAB — BLOOD GAS, ARTERIAL
Acid-Base Excess: 9.6 mmol/L — ABNORMAL HIGH (ref 0.0–2.0)
Bicarbonate: 37.8 mmol/L — ABNORMAL HIGH (ref 20.0–28.0)
Delivery systems: POSITIVE
FIO2: 0.25
O2 Saturation: 86.2 %
PCO2 ART: 70 mmHg — AB (ref 32.0–48.0)
PH ART: 7.34 — AB (ref 7.350–7.450)
Patient temperature: 37
pO2, Arterial: 55 mmHg — ABNORMAL LOW (ref 83.0–108.0)

## 2017-03-09 LAB — MAGNESIUM: Magnesium: 1.6 mg/dL — ABNORMAL LOW (ref 1.7–2.4)

## 2017-03-09 LAB — GLUCOSE, CAPILLARY
GLUCOSE-CAPILLARY: 133 mg/dL — AB (ref 65–99)
Glucose-Capillary: 109 mg/dL — ABNORMAL HIGH (ref 65–99)
Glucose-Capillary: 179 mg/dL — ABNORMAL HIGH (ref 65–99)
Glucose-Capillary: 230 mg/dL — ABNORMAL HIGH (ref 65–99)

## 2017-03-09 MED ORDER — MAGNESIUM SULFATE 2 GM/50ML IV SOLN
2.0000 g | Freq: Once | INTRAVENOUS | Status: AC
  Administered 2017-03-09: 2 g via INTRAVENOUS
  Filled 2017-03-09: qty 50

## 2017-03-09 MED ORDER — NYSTATIN 100000 UNIT/ML MT SUSP
5.0000 mL | Freq: Four times a day (QID) | OROMUCOSAL | Status: DC
  Administered 2017-03-09 – 2017-03-11 (×9): 500000 [IU] via OROMUCOSAL
  Filled 2017-03-09 (×10): qty 5

## 2017-03-09 NOTE — Progress Notes (Signed)
Please note patient is scheduled to have a Home PALLIATIVE visit on 6/20. Writer was informed by Palliative NP Wadie Lessen that patient and his family plan to keep that appointment pending his . CMRN Marshell Garfinkel made aware. Hospital notes faxed to [facility] team.  Thank you Flo Shanks RN, BSN, [place] [facility] [place], [facility] [phone] c

## 2017-03-09 NOTE — Progress Notes (Signed)
[name], male   DOB: 1929-10-23, 83 y.o.   MRN: [id number]  [facility] PROGRESS NOTE  AMAHD MORINO QHU:[id number] DOB: Jan 14, 1930 DOA: 03/08/2017 PCP: [name], MD  HPI/Subjective: Patient brought in with altered mental status. Did not eat in a couple days. Patient is a poor historian and answers only yes or no questions. He does not offer any complaints.  Objective: Vitals:   03/09/17 1000 03/09/17 1100  BP: 119/60 (!) 104/53  Pulse: 87 (!) 101  Resp: 19 (!) 24  Temp:      Filed Weights   03/08/17 0937 03/08/17 1600  Weight: 65.3 kg (144 lb) 62.8 kg (138 lb 7.2 oz)    ROS: Review of Systems  Constitutional: Negative for chills and fever.  Eyes: Negative for blurred vision.  Respiratory: Negative for cough and shortness of breath.   Cardiovascular: Negative for chest pain.  Gastrointestinal: Negative for abdominal pain, constipation, diarrhea, nausea and vomiting.  Genitourinary: Negative for dysuria.  Musculoskeletal: Negative for joint pain.  Neurological: Negative for dizziness and headaches.   Exam: Physical Exam  HENT:  Nose: No mucosal edema.  Mouth/Throat: No oropharyngeal exudate or posterior oropharyngeal edema.  Eyes: Conjunctivae, EOM and lids are normal. Pupils are equal, round, and reactive to light.  Neck: No JVD present. Carotid bruit is not present. No edema present. No thyroid mass and no thyromegaly present.  Cardiovascular: S1 normal and S2 normal.  Exam reveals no gallop.   No murmur heard. Pulses:      Dorsalis pedis pulses are 2+ on the right side, and 2+ on the left side.  Respiratory: No respiratory distress. He has decreased breath sounds in the right middle field, the right lower field, the left middle field and the left lower field. He has no wheezes. He has no rhonchi. He has no rales.  GI: Soft. Bowel sounds are normal. There is no tenderness.  Musculoskeletal:       Right shoulder: He exhibits no  swelling.  Lymphadenopathy:    He has no cervical adenopathy.  Neurological: He is alert. No cranial nerve deficit.  Skin: Skin is warm. No rash noted. Nails show no clubbing.  Psychiatric: He has a normal mood and affect.      Data Reviewed: Basic Metabolic Panel:  Recent Labs Lab 03/08/17 0940 03/09/17 0418  NA 134* 135  K 4.5 4.3  CL 89* 93*  CO2 37* 36*  GLUCOSE 180* 141*  BUN 23* 23*  CREATININE 1.35* 1.10  CALCIUM 9.0 8.7*  MG  --  1.6*   Liver Function Tests:  Recent Labs Lab 03/08/17 0940  AST 18  ALT 9*  ALKPHOS 54  BILITOT 0.5  PROT 6.6  ALBUMIN 3.3*   CBC:  Recent Labs Lab 03/08/17 0940 03/09/17 0418  WBC 7.8 5.2  HGB 12.2* 11.9*  HCT 37.6* 37.4*  MCV 94.8 96.0  PLT 253 254   Cardiac Enzymes:  Recent Labs Lab 03/08/17 0940  TROPONINI <0.03   BNP (last 3 results)  Recent Labs  11/25/16 1425  BNP 83.0     CBG:  Recent Labs Lab 03/08/17 1559 03/08/17 1758 03/08/17 2124 03/09/17 0710 03/09/17 1121  GLUCAP 92 72 119* 133* 109*    Recent Results (from the past 240 hour(s))  MRSA PCR Screening     Status: None   Collection Time: 03/08/17  7:04 PM  Result Value Ref Range Status   MRSA by PCR NEGATIVE NEGATIVE Final  Comment:        The GeneXpert MRSA Assay (FDA approved for NASAL specimens only), is one component of a comprehensive MRSA colonization surveillance program. It is not intended to diagnose MRSA infection nor to guide or monitor treatment for MRSA infections.      Studies: Ct Head Wo Contrast  Result Date: 03/08/2017 CLINICAL DATA:  Altered mental status for several hours EXAM: CT HEAD WITHOUT CONTRAST TECHNIQUE: Contiguous axial images were obtained from the base of the skull through the vertex without intravenous contrast. COMPARISON:  06/25/2015 FINDINGS: Brain: Diffuse atrophic changes and chronic white matter ischemic change is noted. No findings to suggest acute hemorrhage, acute infarction or  space-occupying mass lesion are seen. Vascular: No hyperdense vessel or unexpected calcification. Skull: Normal. Negative for fracture or focal lesion. Sinuses/Orbits: No acute finding. Other: None. IMPRESSION: Chronic atrophic and ischemic changes without acute abnormality. Electronically Signed   By: Inez Catalina M.D.   On: 03/08/2017 10:13   Dg Chest Portable 1 View  Result Date: 03/08/2017 CLINICAL DATA:  Altered mental status. History of hypertension, diabetes and COPD. EXAM: PORTABLE CHEST 1 VIEW COMPARISON:  Radiographs 12/23/2016.  CT 11/25/2016. FINDINGS: 0944 hour. Persistent suboptimal inspiration with resulting bibasilar atelectasis, similar to previous study. There is no confluent airspace opacity, edema, pleural effusion or pneumothorax. The heart size and mediastinal contours are stable. No acute osseous findings are seen. Telemetry leads overlie the chest. IMPRESSION: Stable chest with mild bibasilar atelectasis. No acute cardiopulmonary process. Electronically Signed   By: Richardean Sale M.D.   On: 03/08/2017 09:59    Scheduled Meds: . enoxaparin (LOVENOX) injection  40 mg Subcutaneous Q24H  . finasteride  5 mg Oral Daily  . insulin aspart  0-9 Units Subcutaneous TID WC  . ipratropium-albuterol  3 mL Nebulization Q4H  . levothyroxine  50 mcg Oral QAC breakfast  . methylPREDNISolone (SOLU-MEDROL) injection  60 mg Intravenous Q24H  . nystatin  5 mL Mouth/Throat QID  . pantoprazole  40 mg Oral Daily  . tamsulosin  0.4 mg Oral Daily  . Vitamin D3  50,000 Units Oral Weekly   Continuous Infusions: . sodium chloride 50 mL/hr at 03/09/17 0657    Assessment/Plan:  1. Acute on chronic hypoxic hypercarbic respiratory failure. Patient will be a candidate for noninvasive ventilation at home at night to try to prevent hospitalizations. 2. COPD exacerbation. Continue Solu-Medrol and nebulizer treatments. I turn the patient's oxygen down from 6 L down to 3 L. I asked the nurse to taper  down to 1 or 2 L. Case discussed with critical care specialist. 3. Type 2 diabetes mellitus on sliding scale 4. Memory loss 5. BPH on Flomax and finasteride 6. Hypothyroidism unspecified on levothyroxin 7. GERD on Protonix 8. Weakness physical therapy evaluation  Code Status:     Code Status Orders        Start     Ordered   03/08/17 1600  Full code  Continuous     03/08/17 1559    Code Status History    Date Active Date Inactive Code Status Order ID Comments User Context   11/25/2016  8:44 PM 11/27/2016  2:55 PM Full Code [id number]  Demetrios Loll, MD Inpatient   07/16/2015  3:08 PM 07/22/2015  5:44 PM Full Code [id number]  Hillary Bow, MD ED   06/25/2015  8:14 PM 07/06/2015  5:57 PM Full Code [id number]  Gladstone Lighter, MD Inpatient   11/13/2012  5:41 PM 11/16/2012  1:37 PM Full Code [id number]  Mcarthur Rossetti, MD Inpatient    Advance Directive Documentation     Most Recent Value  Type of Advance Directive  Living will  Pre-existing out of facility DNR order (yellow form or pink MOST form)  -  "MOST" Form in Place?  -     Family Communication: Patient's wife and son-in-law at the bedside Disposition Plan: Likely does not have to stay in the hospital too much longer. Patient potentially could go home Friday or Saturday depending on his strength with physical therapy  Consultants:  Critical care specialist  Palliative care team  Time spent: 25 minutes  Loletha Grayer  [employer]

## 2017-03-09 NOTE — Care Management Note (Signed)
Case Management Note  Patient Details  Name: Aaron Hendrix MRN: [id number] Date of Birth: Dec 21, 1929  Subjective/Objective:                  RNCM met with patient, his wife and son after receiving a call from [facility] with Palliative for NIV request for home. CPAP machine at home has failed to meet patient's respiratory needs and patient will require a non-invasive ventilator in the home- MD agrees. Patient is not affiliated with [facility] administration per [facility]. O2 is acute per family.    Action/Plan:  Referral to [facility] with [facility] home [facility] to review case for Trilogy and home O2. Home health list left with patient's wife for review. RNCM to continue to follow.     Expected Discharge Date:                  Expected Discharge Plan:     In-House Referral:     Discharge planning Services  CM Consult  Post Acute Care Choice:  Durable Medical Equipment, Home Health Choice offered to:  Patient, Spouse, Adult Children  DME Arranged:  Oxygen, NIV DME Agency:  [place]:  RN, PT [facility] Agency:     Status of Service:  In process, will continue to follow  If discussed at Long Length of Stay Meetings, dates discussed:    Additional Comments:  Marshell Garfinkel, RN 03/09/2017, 10:58 AM

## 2017-03-09 NOTE — Progress Notes (Signed)
Patient remained lethargic but responsive most of the evening. Patient became very agitated earlier in the evening when he had family members visiting and he kept trying to speak to them. He wouldn't calm down and was breathing into the 40s and very shallow. RT trialed him on [place] while he was awake until all but one of his family members left.

## 2017-03-09 NOTE — Consult Note (Addendum)
[facility] Consultation      Assessment and Plan:  -Acute COPD exacerbation, with acute chronic bronchitis, leading to acute on chronic hypercapnic respiratory failure. Repeat ABG, 7.34/70/55/37.8. Acute on chronic, compensated hypercapnic respiratory failure. --The patient has severe COPD, chronic respiratory failure, requiring non-invasive ventilator or death may occur as CPAP has been tried in the outpatient setting and failed.  -Obstructive sleep apnea. -Altered mental status/acute encephalopathy secondary to hypercapnia. CT head negative.  -Continue BiPAP, as needed during the day, and daily at bedtime. -We will see if we can facilitate getting the patient on home BiPAP. -Plan discussed with family at bedside.  Date: 03/09/2017  MRN# [id number] Aaron Hendrix June 11, 1930  Referring Physician: Dr. Posey Pronto for [facility].   Aaron Hendrix is a 83 y.o. old male seen in consultation for chief complaint of:    Chief Complaint  Patient presents with  . Altered Mental Status    Subjective: Patient feeling well today. No new complaints. Appears fatigued  Allergies:  Fexofenadine; Quinapril hcl; and Latex  Review of Systems: Gen:  Denies  fever, sweats, chills HEENT: Denies blurred vision, double vision. bleeds, sore throat Cvc:  No dizziness, chest pain. Resp:   Denies cough or sputum production, Gi: Denies swallowing difficulty, stomach pain. Gu:  Denies bladder incontinence, burning urine Ext:   No Joint pain, stiffness. Skin: No skin rash,  hives  Endoc:  No polyuria, polydipsia. Psych: No depression, insomnia. Other:  All other systems were reviewed with the patient and were negative other that what is mentioned in the HPI.   Physical Examination:   VS: BP 122/65 (BP Location: Left Arm)   Pulse 86   Temp 98.1 F (36.7 C) (Axillary)   Resp (!) 23   Ht 5\' 4"  (1.626 m)   Wt 62.8 kg (138 lb 7.2 oz)   SpO2 98%   BMI 23.76 kg/m   General  Appearance: No distress  Neuro:without focal findings,  speech normal,  HEENT: PERRLA, EOM intact.   Pulmonary: Decreased air entry bilaterally. CardiovascularNormal S1,S2.  No m/r/g.   Abdomen: Benign, Soft, non-tender. Renal:  No costovertebral tenderness  GU:  No performed at this time. Endoc: No evident thyromegaly, no signs of acromegaly. Skin:   warm, no rashes, no ecchymosis  Extremities: normal, no cyanosis, clubbing.  Other findings:    LABORATORY PANEL:   CBC  Recent Labs Lab 03/09/17 0418  WBC 5.2  HGB 11.9*  HCT 37.4*  PLT 254   ------------------------------------------------------------------------------------------------------------------  Chemistries   Recent Labs Lab 03/08/17 0940 03/09/17 0418  NA 134* 135  K 4.5 4.3  CL 89* 93*  CO2 37* 36*  GLUCOSE 180* 141*  BUN 23* 23*  CREATININE 1.35* 1.10  CALCIUM 9.0 8.7*  MG  --  1.6*  AST 18  --   ALT 9*  --   ALKPHOS 54  --   BILITOT 0.5  --    ------------------------------------------------------------------------------------------------------------------  Cardiac Enzymes  Recent Labs Lab 03/08/17 0940  TROPONINI <0.03   ------------------------------------------------------------  RADIOLOGY:  Ct Head Wo Contrast  Result Date: 03/08/2017 CLINICAL DATA:  Altered mental status for several hours EXAM: CT HEAD WITHOUT CONTRAST TECHNIQUE: Contiguous axial images were obtained from the base of the skull through the vertex without intravenous contrast. COMPARISON:  06/25/2015 FINDINGS: Brain: Diffuse atrophic changes and chronic white matter ischemic change is noted. No findings to suggest acute hemorrhage, acute infarction or space-occupying mass lesion are seen. Vascular: No hyperdense vessel or unexpected  calcification. Skull: Normal. Negative for fracture or focal lesion. Sinuses/Orbits: No acute finding. Other: None. IMPRESSION: Chronic atrophic and ischemic changes without acute abnormality.  Electronically Signed   By: Inez Catalina M.D.   On: 03/08/2017 10:13   Dg Chest Portable 1 View  Result Date: 03/08/2017 CLINICAL DATA:  Altered mental status. History of hypertension, diabetes and COPD. EXAM: PORTABLE CHEST 1 VIEW COMPARISON:  Radiographs 12/23/2016.  CT 11/25/2016. FINDINGS: 0944 hour. Persistent suboptimal inspiration with resulting bibasilar atelectasis, similar to previous study. There is no confluent airspace opacity, edema, pleural effusion or pneumothorax. The heart size and mediastinal contours are stable. No acute osseous findings are seen. Telemetry leads overlie the chest. IMPRESSION: Stable chest with mild bibasilar atelectasis. No acute cardiopulmonary process. Electronically Signed   By: Richardean Sale M.D.   On: 03/08/2017 09:59       Thank  you for the consultation and for allowing [facility] to assist in the care of your patient. Our recommendations are noted above.  Please contact us if we can be of further service.   Marda Stalker, MD.  Board Certified in Internal Medicine, Pulmonary Medicine, [place], and Sleep Medicine.  [facility] Office Number: [phone]  Patricia Pesa, M.D.  Merton Border, M.D  03/09/2017   Critical Care Attestation.  I have personally obtained a history, examined the patient, evaluated laboratory and imaging results, formulated the assessment and plan and placed orders. The Patient requires high complexity decision making for assessment and support, frequent evaluation and titration of therapies, application of advanced monitoring technologies and extensive interpretation of multiple databases. The patient has critical illness that could lead imminently to failure of 1 or more organ systems and requires the highest level of physician preparedness to intervene.  Critical Care Time devoted to patient care services described in this note is 30 minutes and is  exclusive of time spent in procedures supervisory time of NP.

## 2017-03-09 NOTE — Consult Note (Signed)
Consultation Note Date: 03/09/2017   Patient Name: Aaron Hendrix  DOB: 24-Dec-1929  MRN: [id number]  Age / Sex: 83 y.o., male  PCP: [name], MD Referring Physician: Loletha Grayer, MD  Reason for Consultation: Establishing goals of care and Psychosocial/spiritual support  HPI/Patient Profile: 83 y.o. male   admitted on 03/08/2017 with  a past medical  history of COPD who is on BiPAP at home but unable to tolerate well, DJD, diabetes, hypertension comes to the emergency room after he was found confused and altered mental status with unresponsiveness at home.   Patient is seen by pulmonary/ Dr. Rosita Fire as OP.  He was found to be obtunded/unresponsive in the ED. His CO2 was 121. Patient was emergently placed on BiPAP. He is admitted for stabilization and treatment   He has had slow but continued physical, functional and cognitive decline.     Family face advanced directive decisions and anticipatory care needs    Clinical Assessment and Goals of Care:  This NP Wadie Lessen reviewed medical records, received report from team, assessed the patient and then meet at the patient's bedside along with wife, daughter/HPOA and son  to discuss diagnosis, prognosis, GOC, EOL wishes disposition and options.  A  discussion was had today regarding advanced directives.  Concepts specific to code status, artifical feeding and hydration, continued IV antibiotics and rehospitalization was had.  The difference between a aggressive medical intervention path  and a palliative comfort care path for this patient at this time was had.  Values and goals of care important to patient and family were attempted to be elicited.  Family plans to look for past AD documentation.   MOST form introduced   Concept of Hospice and Palliative Care were discussed  Natural trajectory and expectations at EOL were discussed.  Questions  and concerns addressed.   Family encouraged to call with questions or concerns.  PMT will continue to support holistically.  HCPOA/ daughter/Paula     SUMMARY OF RECOMMENDATIONS    Code Status/Advance Care Planning:  Full code- recommended DNR/DNI status knowing poor outcomes in simlialr patients.  Family to find old documented AD   Symptom Management:   Weakness: PT   Palliative Prophylaxis:   Aspiration, Bowel Regimen, Delirium Protocol and Oral Care  Additional Recommendations (Limitations, Scope, Preferences):  Full Scope Treatment  Psycho-social/Spiritual:   Desire for further Chaplaincy support:no  Additional Recommendations: Education on Hospice  Prognosis:   Unable to determine  Discharge Planning:  They wish for patient to return home with Home Health (PT and respiratory therapy) and Palliative home services. An appointment for next Wednesday is already scheduled with PCS.  Main concern is for home BiPap, I spoke with CM/Angela and she is initiating necessary logistics for approval thru Medicare  I encouraged family to contact both their long term care insurance [employer] in order to access viable care assistance.    Son and daughter are main supports for this patietnt and his wife. Son lives with his parents.  Wife is elderly and has her own heath issues.   Home with Home Health      Primary Diagnoses: Present on Admission: . Acute on chronic respiratory failure with hypercapnia ([place])   I have reviewed the medical record, interviewed the patient and family, and examined the patient. The following aspects are pertinent.  Past Medical History:  Diagnosis Date  . Carpal tunnel syndrome   . COPD with asthma ([place])   . Diabetes mellitus    Diet control   . DJD (degenerative joint disease), cervical   . Duodenal ulcer, with partial obstruction 08/10/2012  . Essential and other specified forms of tremor 12/16/2013  . Hyperlipidemia   .  Hypertension    no medicine needed  . IBS (irritable bowel syndrome)   . Internal hemorrhoids   . Memory deficit 12/16/2013  . Numbness and tingling in right hand    started 2 yeas ago  . Peptic ulcer   . Personal history of colonic polyps    adenomatous  . Polyneuropathy in diabetes(357.2)   . Tremor, essential   . Vitamin D deficiency    Social History   Social History  . Marital status: Married    Spouse name: N/A  . Number of children: 3  . Years of education: MA   Occupational History  . 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 Smoking status: Former Smoker    Types: Cigarettes    Quit date: 07/02/1963  . Smokeless tobacco: Never Used  . Alcohol use No  . Drug use: No  . Sexual activity: Not Asked   Other Topics Concern  . None   Social History Narrative   Veteran [employer]   Family History  Problem Relation Age of Onset  . Thyroid cancer Daughter 47  . Diabetes Son   . Urolithiasis Son   . Leukemia Maternal Aunt   . Colon cancer Neg Hx   . Esophageal cancer Neg Hx   . Rectal cancer Neg Hx   . Stomach cancer Neg Hx   . Prostate cancer Neg Hx   . Kidney disease Neg Hx   . Kidney cancer Neg Hx    Scheduled Meds: . enoxaparin (LOVENOX) injection  40 mg Subcutaneous Q24H  . finasteride  5 mg Oral Daily  . insulin aspart  0-9 Units Subcutaneous TID WC  . ipratropium-albuterol  3 mL Nebulization Q4H  . levothyroxine  50 mcg Oral QAC breakfast  . methylPREDNISolone (SOLU-MEDROL) injection  60 mg Intravenous Q24H  . pantoprazole  40 mg Oral Daily  . tamsulosin  0.4 mg Oral Daily  . Vitamin D3  50,000 Units Oral Weekly   Continuous Infusions: . sodium chloride 50 mL/hr at 03/09/17 0657   PRN Meds:.acetaminophen **OR** acetaminophen, albuterol, ipratropium-albuterol, ondansetron **OR** ondansetron (ZOFRAN) IV, polyethylene glycol Medications Prior to Admission:  Prior to Admission medications   Medication Sig Start Date End Date  Taking? Authorizing Provider  albuterol (PROAIR HFA) 108 (90 Base) MCG/ACT inhaler Inhale 1-2 puffs into the lungs every 6 (six) hours as needed for wheezing or shortness of breath. 07/07/16  Yes [name], MD  Cholecalciferol (VITAMIN D3) 50000 units CAPS Take 50,000 Units by mouth once a week.   Yes [provider]  finasteride (PROSCAR) 5 MG tablet Take 1 tablet (5 mg total) by mouth daily. 11/14/16  Yes [name], [name], PA-C  furosemide (LASIX) 20 MG tablet Take 1 tablet (20 mg total)  by mouth daily. Patient taking differently: Take 40 mg by mouth daily.  07/22/15  Yes Max Sane, MD  ipratropium-albuterol (DUONEB) 0.5-2.5 (3) MG/3ML SOLN Take 3 mLs by nebulization 3 (three) times daily. Patient taking differently: Take 3 mLs by nebulization 3 (three) times daily.  11/27/16  Yes Dustin Flock, MD  levothyroxine (SYNTHROID, LEVOTHROID) 50 MCG tablet Take 1 tablet (50 mcg total) by mouth daily before breakfast. 12/02/16  Yes [name], MD  losartan (COZAAR) 50 MG tablet Take 1 tablet (50 mg total) by mouth daily. 12/02/16  Yes [name], MD  pantoprazole (PROTONIX) 40 MG tablet Take 1 tablet (40 mg total) by mouth daily. 12/02/16  Yes [name], MD  saccharomyces boulardii (FLORASTOR) 250 MG capsule Take 250 mg by mouth as needed.    Yes [provider]  tamsulosin (FLOMAX) 0.4 MG CAPS capsule Take 1 capsule (0.4 mg total) by mouth daily. 11/14/16  Yes [name], [name], PA-C  umeclidinium-vilanterol (ANORO ELLIPTA) 62.5-25 MCG/INH AEPB Inhale 1 puff into the lungs daily. 03/06/17  Yes [name], MD   Allergies  Allergen Reactions  . Fexofenadine Other (See Comments)  . Quinapril Hcl Other (See Comments)    Unknown   . Latex Rash   Review of Systems  Physical Exam  Constitutional: He appears ill. Nasal cannula in place.  Cardiovascular: Normal rate, regular rhythm and normal heart sounds.   Pulmonary/Chest: He has decreased breath sounds in  the right lower field and the left lower field.  Neurological: He is alert.  Skin: Skin is warm and dry.    Vital Signs: BP 122/65 (BP Location: Left Arm)   Pulse 86   Temp 98.1 F (36.7 C) (Axillary)   Resp (!) 23   Ht 5\' 4"  (1.626 m)   Wt 62.8 kg (138 lb 7.2 oz)   SpO2 98%   BMI 23.76 kg/m  Pain Assessment: No/denies pain      SpO2: SpO2: 98 % O2 Device:SpO2: 98 % O2 Flow Rate: .O2 Flow Rate (L/min): 4 L/min  IO: Intake/output summary:  Intake/Output Summary (Last 24 hours) at 03/09/17 0924 Last data filed at 03/09/17 0800  Gross per 24 hour  Intake           850.83 ml  Output              275 ml  Net           575.83 ml    LBM:   Baseline Weight: Weight: 65.3 kg (144 lb) Most recent weight: Weight: 62.8 kg (138 lb 7.2 oz)     Palliative Assessment/Data: 30 %   Discussed with Dr Leslye Peer  Time In: 0800 Time Out: 0930 Time Total: 90 min Greater than 50%  of this time was spent counseling and coordinating care related to the above assessment and plan.  Signed by: Wadie Lessen, NP   Please contact Palliative Medicine Team phone at [phone] for questions and concerns.  For individual provider: See Shea Evans

## 2017-03-09 NOTE — Evaluation (Signed)
Physical Therapy Evaluation Patient Details Name: Aaron Hendrix MRN: [id number] DOB: 1930-05-27 Today's Date: 03/09/2017   History of Present Illness  83 y.o. male with a known history of COPD who is on BiPAP at home however is noncompliant, DJD, diabetes, hypertension comes to the emergency room after he was found confused and altered mental status with unresponsiveness later on by wife at home  Clinical Impression  Pt showed good effort with mobility and ambulation and did relatively well.  On arrival pt was on room air and sats were 88-91%, dropped to mid 80s with light movement in bed and strength testing.  On 2 liters her sats went up to 99% and he did not have excessive fatigue with O2 donned during ambulation.  He was initially unsteady on standing, but with cuing and encouragement was able to maintain balance well and walked out of room and minimally down the hall before needing to turn back and get onto the [facility]  Overall pt did well and showed great effort and motivation.  Pt is not at his baseline and will need PT, but should be able to return home with assist from family (wife home 24/7, son gone t/o the day.)    Follow Up Recommendations Home health PT    Equipment Recommendations       Recommendations for Other Services       Precautions / Restrictions Precautions Precautions: Fall Restrictions Weight Bearing Restrictions: No      Mobility  Bed Mobility Overal bed mobility: Modified Independent             General bed mobility comments: Pt able to get himself to EOB w/o assist, no excessive effort or lightheadedness   Transfers Overall transfer level: Modified independent Equipment used: Rolling walker (2 wheeled)             General transfer comment: Pt was able to rise to standing w/o direct assist, did have some issues maintaining balance and using walker appropriately.  Needed considerable cuing to insure appropriate walker use.    Ambulation/Gait Ambulation/Gait assistance: Min guard;Min assist Ambulation Distance (Feet): 45 Feet Assistive device: Rolling walker (2 wheeled)       General Gait Details: Pt with slow, hesitant ambulation, but on 2 liters he did not have excessive fatigue and though he had some general unsteadiness he had no overt LOBs or significant safety concerns.   Stairs            Wheelchair Mobility    Modified Rankin (Stroke Patients Only)       Balance Overall balance assessment: Needs assistance Sitting-balance support: Bilateral upper extremity supported Sitting balance-Leahy Scale: Good     Standing balance support: Bilateral upper extremity supported Standing balance-Leahy Scale: Fair                               Pertinent Vitals/Pain Pain Assessment: No/denies pain    Home Living Family/patient expects to be discharged to:: Private residence Living Arrangements: Spouse/significant other;Children Available Help at Discharge: Family;Available 24 hours/day   Home Access: Level entry       Home Equipment: Walker - 2 wheels;Cane - single point      Prior Function Level of Independence: Independent with assistive device(s)         Comments: Independent in the home, apparently pt uses a cane outside the house, not out more than 1 or 2X/wk     Hand Dominance  Extremity/Trunk Assessment   Upper Extremity Assessment Upper Extremity Assessment: Overall WFL for tasks assessed    Lower Extremity Assessment Lower Extremity Assessment: Overall WFL for tasks assessed       Communication   Communication: No difficulties  Cognition Arousal/Alertness: Awake/alert Behavior During Therapy: WFL for tasks assessed/performed Overall Cognitive Status: Within Functional Limits for tasks assessed                                        General Comments      Exercises     Assessment/Plan    PT Assessment Patient needs  continued PT services  PT Problem List Decreased strength;Decreased range of motion;Decreased activity tolerance;Decreased balance;Decreased mobility;Decreased safety awareness;Pain;Decreased knowledge of use of DME;Decreased coordination       PT Treatment Interventions DME instruction;Gait training;Functional mobility training;Therapeutic activities;Balance training;Therapeutic exercise;Neuromuscular re-education;Patient/family education    PT Goals (Current goals can be found in the Care Plan section)  Acute Rehab PT Goals Patient Stated Goal: go home PT Goal Formulation: With patient Time For Goal Achievement: 03/23/17 Potential to Achieve Goals: Good    Frequency Min 2X/week   Barriers to discharge        Co-evaluation               AM-PAC PT "6 Clicks" Daily Activity  Outcome Measure Difficulty turning over in bed (including adjusting bedclothes, sheets and blankets)?: None Difficulty moving from lying on back to sitting on the side of the bed? : None Difficulty sitting down on and standing up from a chair with arms (e.g., wheelchair, bedside commode, etc,.)?: A Little Help needed moving to and from a bed to chair (including a wheelchair)?: A Little Help needed walking in hospital room?: A Little Help needed climbing 3-5 steps with a railing? : A Lot 6 Click Score: 19    End of Session Equipment Utilized During Treatment: Gait belt Activity Tolerance: Patient tolerated treatment well;Patient limited by fatigue Patient left: in chair;with family/visitor present;with nursing/sitter in room Nurse Communication: Mobility status PT Visit Diagnosis: Muscle weakness (generalized) (M62.81);Difficulty in walking, not elsewhere classified (R26.2)    Time: 5945-8592 PT Time Calculation (min) (ACUTE ONLY): 37 min   Charges:   PT Evaluation $PT Eval Low Complexity: 1 Procedure PT Treatments $Gait Training: 8-22 mins   PT G Codes:        Kreg Shropshire,  DPT 03/09/2017, 3:55 PM

## 2017-03-09 NOTE — Progress Notes (Signed)
Pt had a 17 beat run of SVT. Asymptomatic. VSS. MD notified. No new orders given. Will monitor closely

## 2017-03-09 NOTE — Care Management (Signed)
Trilogy form completed by Dr. Mortimer Fries and delivered to [facility] with [facility].

## 2017-03-09 NOTE — Evaluation (Signed)
Clinical/Bedside Swallow Evaluation Patient Details  Name: Aaron Hendrix MRN: [id number] Date of Birth: 1929/10/26  Today's Date: 03/09/2017 Time: SLP Start Time (ACUTE ONLY): 1100 SLP Stop Time (ACUTE ONLY): 1200 SLP Time Calculation (min) (ACUTE ONLY): 60 min  Past Medical History:  Past Medical History:  Diagnosis Date  . Carpal tunnel syndrome   . COPD with asthma ([place])   . Diabetes mellitus    Diet control   . DJD (degenerative joint disease), cervical   . Duodenal ulcer, with partial obstruction 08/10/2012  . Essential and other specified forms of tremor 12/16/2013  . Hyperlipidemia   . Hypertension    no medicine needed  . IBS (irritable bowel syndrome)   . Internal hemorrhoids   . Memory deficit 12/16/2013  . Numbness and tingling in right hand    started 2 yeas ago  . Peptic ulcer   . Personal history of colonic polyps    adenomatous  . Polyneuropathy in diabetes(357.2)   . Tremor, essential   . Vitamin D deficiency    Past Surgical History:  Past Surgical History:  Procedure Laterality Date  . ANAL FISSURE REPAIR    . ANKLE SURGERY Right    right- pins placed in  . COLONOSCOPY W/ BIOPSIES AND POLYPECTOMY  8/03, 6/05, 7/09, 9/10   internal hemorrhoids, tubular adenomas, mucosa & lymphoid nodules  . INGUINAL HERNIA REPAIR Right   . TOTAL HIP ARTHROPLASTY Right 11/13/2012   Procedure: TOTAL HIP ARTHROPLASTY ANTERIOR APPROACH;  Surgeon: Mcarthur Rossetti, MD;  Location: [facility];  Service: Orthopedics;  Laterality: Right;  Right total hip arthroplasty  . UPPER GASTROINTESTINAL ENDOSCOPY  3/05, 7/09, 9/10,2013   gastritis, duodenitis   HPI:  Pt is a 83 y.o. male with a known history of COPD who is on BiPAP at home however is noncompliant, DJD, diabetes, hypertension comes to the emergency room after he was found confused and altered mental status with unresponsiveness later on by wife at home. Patient apparently has been noncompliant with his CPAP which was  recently changed to BiPAP at home by pulmonary Dr. Rosita Fire. He was found to be obtunded/unresponsive in the ED. His CO2 was 121. Patient was emergently placed on BiPAP. Repeat ABG shows CO2 of 85 after 2 hours. Patient is now moaning and groaning to sternal rub and intermittently opens his eyes. He is being admitted with acute on chronic hypercarbic respiratory failure. Per report, pt's wife/family have turned up his O2 at times at home which may be contributing to CO2 retention. Pt is verbally responsive and follows basic commands. Pt does have a baseline of Cognitive decline per chart notes. HR/RR and O2 sats stable at rest.    Assessment / Plan / Recommendation Clinical Impression  Pt appears at reduced risk for aspiration when following general aspiration precautions; no overt oropharyngeal phase dysphagia noted during this evaluation today. Pt consumed trials of thin liquids VIA CUP and trials of puree helping to feed self both w/ no overt s/s of aspiration noted; no decline in O2 sats or RR/HR - RR remained in low 20's, O2 sats 99-100%. Pt exhibited no oral phase deficits w/ the trials given; appropriate oral clearing w/ all trials. No solids were assessed at this evaluation d/t overall respiratory status today; lacking placement of denture plates. Recommend a dysphagia level 1 (puree) w/ thin liquids; aspiration precautions; pills in Puree. ST services will f/u w/ toleration of diet and trials to upgrade to soft, broken down solids as appropriate next 1-2  days. Recommend assist w/ meals d/t overall weakness.  SLP Visit Diagnosis: Dysphagia, oropharyngeal phase (R13.12)    Aspiration Risk   (reduced risk following aspiration precautions)    Diet Recommendation  Dysphagia level 1(puree) w/ thin liquids initially; aspiration precautions; assistance at meals w/ rest breaks as needed to avoid WOB/SOB  Medication Administration: Whole meds with puree (crushed if needed for easier swallowing)    Other   Recommendations Recommended Consults:  (Dietician f/u) Oral Care Recommendations: Oral care BID;Staff/trained caregiver to provide oral care   Follow up Recommendations None (TBD)      Frequency and Duration min 3x week  2 weeks       Prognosis Prognosis for Safe Diet Advancement: Good Barriers to Reach Goals: Cognitive deficits      Swallow Study   General Date of Onset: 03/08/17 HPI: Pt is a 83 y.o. male with a known history of COPD who is on BiPAP at home however is noncompliant, DJD, diabetes, hypertension comes to the emergency room after he was found confused and altered mental status with unresponsiveness later on by wife at home. Patient apparently has been noncompliant with his CPAP which was recently changed to BiPAP at home by pulmonary Dr. Rosita Fire. He was found to be obtunded/unresponsive in the ED. His CO2 was 121. Patient was emergently placed on BiPAP. Repeat ABG shows CO2 of 85 after 2 hours. Patient is now moaning and groaning to sternal rub and intermittently opens his eyes. He is being admitted with acute on chronic hypercarbic respiratory failure. Per report, pt's wife/family have turned up his O2 at times at home which may be contributing to CO2 retention. Pt is verbally responsive and follows basic commands. Pt does have a baseline of Cognitive decline per chart notes. HR/RR and O2 sats stable at rest.  Type of Study: Bedside Swallow Evaluation Previous Swallow Assessment: none stated Diet Prior to this Study: Regular;Thin liquids (per wife) Temperature Spikes Noted: No (wbc 5.2) Respiratory Status: Nasal cannula (2-4 liters) History of Recent Intubation: No Behavior/Cognition: Alert;Cooperative;Pleasant mood;Confused;Distractible;Requires cueing (min) Oral Cavity Assessment: Within Functional Limits;Dry (min) Oral Care Completed by SLP: Recent completion by staff Oral Cavity - Dentition: Missing dentition;Dentures, top (Bottom partial plate) Vision: Functional for  self-feeding Self-Feeding Abilities: Able to feed self;Needs set up;Needs assist (min shaky) Patient Positioning: Upright in bed Baseline Vocal Quality: Normal;Low vocal intensity Volitional Cough: Strong Volitional Swallow: Able to elicit    Oral/Motor/Sensory Function Overall Oral Motor/Sensory Function: Within functional limits   Ice Chips Ice chips: Within functional limits Presentation: Spoon (fed; 3 trials)   Thin Liquid Thin Liquid: Within functional limits Presentation: Cup;Self Fed (~4 ozs total) Other Comments: min shaky in UEs holding cup    Nectar Thick Nectar Thick Liquid: Not tested   Honey Thick Honey Thick Liquid: Not tested   Puree Puree: Within functional limits Presentation: Self Fed;Spoon (4 ozs total) Other Comments: min impulsive - responded well to verbal cues   Solid   GO   Solid: Not tested Other Comments: will assess tomorrow          [name], MS, CCC-SLP [name],[name] 03/09/2017,5:16 PM

## 2017-03-09 NOTE — Progress Notes (Addendum)
SATURATION QUALIFICATIONS: (This note is used to comply with regulatory documentation for home oxygen)  Patient Saturations on Room Air at Rest = 86%   Patient Saturations on Room Air while Ambulating: N/A  Patient Saturations on n/a Liters of oxygen while Ambulating = N/A  Please briefly explain why patient needs home oxygen:COPD

## 2017-03-10 LAB — GLUCOSE, CAPILLARY
GLUCOSE-CAPILLARY: 123 mg/dL — AB (ref 65–99)
GLUCOSE-CAPILLARY: 147 mg/dL — AB (ref 65–99)
Glucose-Capillary: 105 mg/dL — ABNORMAL HIGH (ref 65–99)
Glucose-Capillary: 140 mg/dL — ABNORMAL HIGH (ref 65–99)

## 2017-03-10 MED ORDER — PREDNISONE 20 MG PO TABS
30.0000 mg | ORAL_TABLET | Freq: Every day | ORAL | Status: DC
  Administered 2017-03-10 – 2017-03-11 (×2): 30 mg via ORAL
  Filled 2017-03-10 (×2): qty 1

## 2017-03-10 MED ORDER — IPRATROPIUM-ALBUTEROL 0.5-2.5 (3) MG/3ML IN SOLN
3.0000 mL | Freq: Three times a day (TID) | RESPIRATORY_TRACT | Status: DC
  Administered 2017-03-10 – 2017-03-11 (×3): 3 mL via RESPIRATORY_TRACT
  Filled 2017-03-10 (×3): qty 3

## 2017-03-10 MED ORDER — SALINE SPRAY 0.65 % NA SOLN
1.0000 | NASAL | Status: DC | PRN
  Administered 2017-03-10: 1 via NASAL
  Filled 2017-03-10: qty 44

## 2017-03-10 NOTE — Consult Note (Addendum)
  [facility] Consultation      Assessment and Plan:  -Acute COPD exacerbation, with acute chronic bronchitis, leading to acute on chronic hypercapnic respiratory failure.  --The patient has severe COPD, chronic respiratory failure, requiring non-invasive ventilator or death may occur as CPAP has been tried in the outpatient setting and failed.  -Obstructive sleep apnea. -Altered mental status/acute encephalopathy secondary to hypercapnia- improved.   -Continue BiPAP, daily at bedtime and with all naps.  -Discussed with case management regarding helping to obtain a home Trilogy ventilator.   Date: 03/10/2017  MRN# [id number] Aaron Hendrix 1930-09-14  Referring Physician: Dr. Posey Pronto for [facility].   Aaron Hendrix is a 83 y.o. old male seen in consultation for chief complaint of:    Chief Complaint  Patient presents with  . Altered Mental Status    Subjective: Patient feeling well today. No new complaints.   Allergies:  Fexofenadine; Quinapril hcl; and Latex  Review of Systems: Gen:  Denies  fever, sweats, chills HEENT: Denies blurred vision, double vision. bleeds, sore throat Cvc:  No dizziness, chest pain. Resp:   Denies cough or sputum production, Gi: Denies swallowing difficulty, stomach pain. Gu:  Denies bladder incontinence, burning urine Ext:   No Joint pain, stiffness. Skin: No skin rash,  hives  Endoc:  No polyuria, polydipsia. Psych: No depression, insomnia. Other:  All other systems were reviewed with the patient and were negative other that what is mentioned in the HPI.   Physical Examination:   VS: BP 134/75   Pulse 90   Temp 97.6 F (36.4 C) (Axillary)   Resp 20   Ht 5\' 4"  (1.626 m)   Wt 62.8 kg (138 lb 7.2 oz)   SpO2 92%   BMI 23.76 kg/m   General Appearance: No distress  Neuro:without focal findings,  speech normal,  HEENT: PERRLA, EOM intact.   Pulmonary:Continues to have Decreased air entry  bilaterally. CardiovascularNormal S1,S2.  No m/r/g.   Abdomen: Benign, Soft, non-tender. Renal:  No costovertebral tenderness  GU:  No performed at this time. Endoc: No evident thyromegaly, no signs of acromegaly. Skin:   warm, no rashes, no ecchymosis  Extremities: normal, no cyanosis, clubbing.  Other findings:    LABORATORY PANEL:   CBC  Recent Labs Lab 03/09/17 0418  WBC 5.2  HGB 11.9*  HCT 37.4*  PLT 254   ------------------------------------------------------------------------------------------------------------------  Chemistries   Recent Labs Lab 03/08/17 0940 03/09/17 0418  NA 134* 135  K 4.5 4.3  CL 89* 93*  CO2 37* 36*  GLUCOSE 180* 141*  BUN 23* 23*  CREATININE 1.35* 1.10  CALCIUM 9.0 8.7*  MG  --  1.6*  AST 18  --   ALT 9*  --   ALKPHOS 54  --   BILITOT 0.5  --    ------------------------------------------------------------------------------------------------------------------  Cardiac Enzymes  Recent Labs Lab 03/08/17 0940  TROPONINI <0.03   ------------------------------------------------------------  RADIOLOGY:  No results found.     Thank  you for the consultation and for allowing [facility] to assist in the care of your patient. Our recommendations are noted above.  Please contact us if we can be of further service.   Marda Stalker, MD.  Board Certified in Internal Medicine, Pulmonary Medicine, [place], and Sleep Medicine.  [facility] Office Number: [phone]  Patricia Pesa, M.D.  Merton Border, M.D  03/10/2017

## 2017-03-10 NOTE — Care Management (Signed)
Per Tim with [facility] [place] will be next week- they have accepted patient for home health.

## 2017-03-10 NOTE — Progress Notes (Signed)
[name], male   DOB: 1929-11-27, 83 y.o.   MRN: [id number]   [employer] PROGRESS NOTE  Aaron Hendrix:[id number] DOB: 1930-02-08 DOA: 03/08/2017 PCP: Aaron Pheasant, MD  HPI/Subjective: Patient feels okay. Patient has slight cough. Occasional shortness of breath. Feeling a lot better since when he came in. Brought in with altered mental status and found to have a high PCO2 of 120  Objective: Vitals:   03/10/17 1200 03/10/17 1218  BP:  132/61  Pulse:  81  Resp:  18  Temp: 98.3 F (36.8 C) 97.6 F (36.4 C)    Filed Weights   03/08/17 0937 03/08/17 1600  Weight: 65.3 kg (144 lb) 62.8 kg (138 lb 7.2 oz)    ROS: Review of Systems  Constitutional: Negative for chills and fever.  Eyes: Negative for blurred vision.  Respiratory: Negative for cough and shortness of breath.   Cardiovascular: Negative for chest pain.  Gastrointestinal: Negative for abdominal pain, constipation, diarrhea, nausea and vomiting.  Genitourinary: Negative for dysuria.  Musculoskeletal: Negative for joint pain.  Neurological: Negative for dizziness and headaches.   Exam: Physical Exam  HENT:  Nose: No mucosal edema.  Mouth/Throat: No oropharyngeal exudate or posterior oropharyngeal edema.  Eyes: Conjunctivae, EOM and lids are normal. Pupils are equal, round, and reactive to light.  Neck: No JVD present. Carotid bruit is not present. No edema present. No thyroid mass and no thyromegaly present.  Cardiovascular: S1 normal and S2 normal.  Exam reveals no gallop.   No murmur heard. Pulses:      Dorsalis pedis pulses are 2+ on the right side, and 2+ on the left side.  Respiratory: No respiratory distress. He has decreased breath sounds in the right lower field and the left lower field. He has no wheezes. He has no rhonchi. He has no rales.  GI: Soft. Bowel sounds are normal. There is no tenderness.  Musculoskeletal:       Right shoulder: He exhibits no swelling.   Lymphadenopathy:    He has no cervical adenopathy.  Neurological: He is alert. No cranial nerve deficit.  Skin: Skin is warm. No rash noted. Nails show no clubbing.  Psychiatric: He has a normal mood and affect.      Data Reviewed: Basic Metabolic Panel:  Recent Labs Lab 03/08/17 0940 03/09/17 0418  NA 134* 135  K 4.5 4.3  CL 89* 93*  CO2 37* 36*  GLUCOSE 180* 141*  BUN 23* 23*  CREATININE 1.35* 1.10  CALCIUM 9.0 8.7*  MG  --  1.6*   Liver Function Tests:  Recent Labs Lab 03/08/17 0940  AST 18  ALT 9*  ALKPHOS 54  BILITOT 0.5  PROT 6.6  ALBUMIN 3.3*   CBC:  Recent Labs Lab 03/08/17 0940 03/09/17 0418  WBC 7.8 5.2  HGB 12.2* 11.9*  HCT 37.6* 37.4*  MCV 94.8 96.0  PLT 253 254   Cardiac Enzymes:  Recent Labs Lab 03/08/17 0940  TROPONINI <0.03   BNP (last 3 results)  Recent Labs  11/25/16 1425  BNP 83.0     CBG:  Recent Labs Lab 03/09/17 1121 03/09/17 1626 03/09/17 2133 03/10/17 0811 03/10/17 1136  GLUCAP 109* 179* 230* 123* 105*    Recent Results (from the past 240 hour(s))  MRSA PCR Screening     Status: None   Collection Time: 03/08/17  7:04 PM  Result Value Ref Range Status   MRSA by PCR NEGATIVE NEGATIVE Final    Comment:  The GeneXpert MRSA Assay (FDA approved for NASAL specimens only), is one component of a comprehensive MRSA colonization surveillance program. It is not intended to diagnose MRSA infection nor to guide or monitor treatment for MRSA infections.       Scheduled Meds: . enoxaparin (LOVENOX) injection  40 mg Subcutaneous Q24H  . finasteride  5 mg Oral Daily  . insulin aspart  0-9 Units Subcutaneous TID WC  . ipratropium-albuterol  3 mL Nebulization TID  . levothyroxine  50 mcg Oral QAC breakfast  . nystatin  5 mL Mouth/Throat QID  . pantoprazole  40 mg Oral Daily  . predniSONE  30 mg Oral Q breakfast  . tamsulosin  0.4 mg Oral Daily  . Vitamin D3  50,000 Units Oral Weekly     Assessment/Plan:  1. Acute on chronic hypoxic hypercarbic respiratory failure. Patient will be a candidate for noninvasive ventilation at home at night to try to prevent hospitalizations.  We are waiting for approval from [place] health care for noninvasive ventilation at night.  The patient has CPAP at night at home and this will not work to blow off CO2. I am hesitant on discharging the patient at this point without approval for noninvasive ventilation at night. I will continue BiPAP at night while here. Obtain a VBG tomorrow morning. With his CO2 high on chemistry he likely is a chronic retainer. 2. COPD exacerbation. Switched to by mouth prednisone. Patient tapered off oxygen. 3. Type 2 diabetes mellitus on sliding scale 4. Memory loss 5. BPH on Flomax and finasteride 6. Hypothyroidism unspecified on levothyroxine 7. GERD on Protonix 8. Physical therapy recommended home with home health  Code Status:     Code Status Orders        Start     Ordered   03/08/17 1600  Full code  Continuous     03/08/17 1559    Code Status History    Date Active Date Inactive Code Status Order ID Comments User Context   11/25/2016  8:44 PM 11/27/2016  2:55 PM Full Code [id number]  Demetrios Loll, MD Inpatient   07/16/2015  3:08 PM 07/22/2015  5:44 PM Full Code [id number]  Hillary Bow, MD ED   06/25/2015  8:14 PM 07/06/2015  5:57 PM Full Code [id number]  Gladstone Lighter, MD Inpatient   11/13/2012  5:41 PM 11/16/2012  1:37 PM Full Code [id number]  Mcarthur Rossetti, MD Inpatient    Advance Directive Documentation     Most Recent Value  Type of Advance Directive  Living will  Pre-existing out of facility DNR order (yellow form or pink MOST form)  -  "MOST" Form in Place?  -     Family Communication: Patient's wife and daughter at the bedside Disposition Plan: Likely does not have to stay in the hospital too much longer. We are still waiting for [place] healthcare to approve noninvasive ventilation at  home.  Consultants:  Critical care specialist  Palliative care team  Time spent: 35 minutes  [place], [place]

## 2017-03-10 NOTE — Progress Notes (Signed)
  Speech Language Pathology Treatment: Dysphagia  Patient Details Name: Aaron Hendrix MRN: [id number] DOB: 1929/10/22 Today's Date: 03/10/2017 Time: 5498-2641 SLP Time Calculation (min) (ACUTE ONLY): 35 min  Assessment / Plan / Recommendation Clinical Impression  Pt seen for toleration of dysphagia diet and trials to upgrade food consistency to solids today. Pt has been tolerating wearing his dentures as well as the puree/thin liquid diet since yesterday. Pt given education on importance of taking small bites/sips, chewing foods well, and clearing mouth fully. Also educated pt and Wife on the importance of taking rest breaks to avoid any SOB w/ oral intake. Pt consumed trials of soft solids w/ no oropharyngeal phase deficits noted; appropriate oral management and clearing w/ all trials and no overt s/s of aspiration noted. Wife and pt felt comfortable upgrading diet consistency. ST services will modify diet to a Dysphagia level 3(mech soft w/ cut meats) w/ thin liquids; general aspiration precautions; Pills in Puree as needed for easier, safer swallowing. ST services will be available for any further skilled services if indicated while admitted. Pt and wife verbally agreed; MD/NSG updated.    HPI HPI: Pt is a 83 y.o. male with a known history of COPD who is on BiPAP at home however is noncompliant, DJD, diabetes, hypertension comes to the emergency room after he was found confused and altered mental status with unresponsiveness later on by wife at home. Patient apparently has been noncompliant with his CPAP which was recently changed to BiPAP at home by pulmonary Dr. Rosita Fire. He was found to be obtunded/unresponsive in the ED. His CO2 was 121. Patient was emergently placed on BiPAP. Repeat ABG shows CO2 of 85 after 2 hours. Patient is now moaning and groaning to sternal rub and intermittently opens his eyes. He is being admitted with acute on chronic hypercarbic respiratory failure. Per report, pt's  wife/family have turned up his O2 at times at home which may be contributing to CO2 retention. Pt is verbally responsive and follows basic commands. Pt does have a baseline of Cognitive decline per chart notes. Wife and Dtr stated pt did well w/ current dysphagia since yesterday.      SLP Plan  All goals met       Recommendations  Diet recommendations: Dysphagia 3 (mechanical soft);Thin liquid Liquids provided via: Cup;Straw (monitor any straw use) Medication Administration: Whole meds with puree (as needed for easier swallowing) Supervision: Patient able to self feed;Intermittent supervision to cue for compensatory strategies Compensations: Minimize environmental distractions;Slow rate;Small sips/bites;Lingual sweep for clearance of pocketing;Multiple dry swallows after each bite/sip;Follow solids with liquid Postural Changes and/or Swallow Maneuvers: Seated upright 90 degrees;Upright 30-60 min after meal                General recommendations:  (Dietician f/u as needed) Oral Care Recommendations: Oral care BID;Staff/trained caregiver to provide oral care;Patient independent with oral care Follow up Recommendations: None SLP Visit Diagnosis: Dysphagia, oropharyngeal phase (R13.12) Plan: All goals met       GO                Aaron Kenner, MS, CCC-SLP Hendrix,Aaron 03/10/2017, 1:42 PM

## 2017-03-10 NOTE — Progress Notes (Signed)
Report called to [facility] on 1A, pt transported in wheelchair with his belongings, chart, and meds.  Family accompanied to room.  VSS, no s/sx distress.  Pt denied pain or SOB

## 2017-03-10 NOTE — Care Management (Signed)
Patient will need new O2 sats based on PT note from today to see if he still qualifies for home O2. Please assess resting and ambulatory O2 sats using template in EPIC for correct order of assessment.

## 2017-03-10 NOTE — Progress Notes (Signed)
Physical Therapy Treatment Patient Details Name: Aaron Hendrix MRN: [id number] DOB: August 16, 1930 Today's Date: 03/10/2017    History of Present Illness 83 y.o. male with a known history of COPD who is on BiPAP at home however is noncompliant, DJD, diabetes, hypertension comes to the emergency room after he was found confused and altered mental status with unresponsiveness later on by wife at home    PT Comments    Pt did very well with PT today and was able to confidently and consistently ambulate with walker today ~350 ft.  He was on 2 liters during the effort and sats seemed to stay in the high 90s, he was ~90% at rest and during light exercises on room air prior to ambulation.  Pt making very good gains, eager to go home when he is stable.   Follow Up Recommendations  Home health PT     Equipment Recommendations       Recommendations for Other Services       Precautions / Restrictions Precautions Precautions: Fall Restrictions Weight Bearing Restrictions: No    Mobility  Bed Mobility Overal bed mobility: Independent             General bed mobility comments: Pt did well getting to EOB w/o assist  Transfers Overall transfer level: Modified independent Equipment used: Rolling walker (2 wheeled)             General transfer comment: Pt again needing cues for set up, hand placement and appropriate walker use.  He did not have any LOBs, but was a little unsteady initially standing up  Ambulation/Gait Ambulation/Gait assistance: Modified independent (Device/Increase time) Ambulation Distance (Feet): 350 Feet Assistive device: Rolling walker (2 wheeled)       General Gait Details: Pt with much more confident ambulation today with consistent speed and walker movement, good stride/safety/balance and generally pt did very well.  On 2 liters during the effort with sats in the high 90s.   Stairs            Wheelchair Mobility    Modified Rankin (Stroke  Patients Only)       Balance     Sitting balance-Leahy Scale: Good       Standing balance-Leahy Scale: Good                              Cognition Arousal/Alertness: Awake/alert Behavior During Therapy: WFL for tasks assessed/performed Overall Cognitive Status: Within Functional Limits for tasks assessed                                        Exercises General Exercises - Lower Extremity Long Arc Quad: Strengthening;10 reps Heel Slides: Strengthening;10 reps Hip ABduction/ADduction: Strengthening;10 reps Hip Flexion/Marching: Strengthening;10 reps    General Comments        Pertinent Vitals/Pain Pain Assessment: No/denies pain    Home Living                      Prior Function            PT Goals (current goals can now be found in the care plan section) Progress towards PT goals: Progressing toward goals    Frequency    Min 2X/week      PT Plan Current plan remains appropriate    Co-evaluation  AM-PAC PT "6 Clicks" Daily Activity  Outcome Measure  Difficulty turning over in bed (including adjusting bedclothes, sheets and blankets)?: None Difficulty moving from lying on back to sitting on the side of the bed? : None Difficulty sitting down on and standing up from a chair with arms (e.g., wheelchair, bedside commode, etc,.)?: None Help needed moving to and from a bed to chair (including a wheelchair)?: None Help needed walking in hospital room?: None Help needed climbing 3-5 steps with a railing? : A Little 6 Click Score: 23    End of Session Equipment Utilized During Treatment: Gait belt;Oxygen (2 liters during ambulation, sats 88-92% on room air in bed) Activity Tolerance: Patient tolerated treatment well Patient left: in chair;with family/visitor present;with call bell/phone within reach   PT Visit Diagnosis: Muscle weakness (generalized) (M62.81);Difficulty in walking, not elsewhere  classified (R26.2)     Time: 9826-4158 PT Time Calculation (min) (ACUTE ONLY): 29 min  Charges:  $Gait Training: 8-22 mins $Therapeutic Exercise: 8-22 mins                    G Codes:       Kreg Shropshire, DPT 03/10/2017, 10:50 AM

## 2017-03-10 NOTE — Progress Notes (Signed)
Per DrRam order nasal spray saline, and stop NS @ 50

## 2017-03-10 NOTE — Care Management (Signed)
RNCM spoke with patient's wife and patient's daughter regarding home health agency list that I shared yesterday. Per daughter, patient has used "[name] (now [facility]) in the past and would like to use them again. Referral has been emailed to [employer] with [facility] for review. I explained that RNCM is working on home Trilogy and home O2 through [facility]. RNCM will continue to follow. Neither had any questions for RNCM.

## 2017-03-10 NOTE — Care Management (Signed)
Continue to wait on Trilogy approval from [facility]. Case dicussed with Dr. Earleen Newport and he may discharge patient tomorrow. [facility] updated on progression.

## 2017-03-11 LAB — BASIC METABOLIC PANEL
ANION GAP: 6 (ref 5–15)
BUN: 27 mg/dL — ABNORMAL HIGH (ref 6–20)
CHLORIDE: 95 mmol/L — AB (ref 101–111)
CO2: 36 mmol/L — AB (ref 22–32)
Calcium: 9 mg/dL (ref 8.9–10.3)
Creatinine, Ser: 0.97 mg/dL (ref 0.61–1.24)
GFR calc non Af Amer: >60 mL/min (ref >60)
Glucose, Bld: 142 mg/dL — ABNORMAL HIGH (ref 65–99)
Potassium: 4.4 mmol/L (ref 3.5–5.1)
Sodium: 137 mmol/L (ref 135–145)

## 2017-03-11 LAB — MAGNESIUM: Magnesium: 1.9 mg/dL (ref 1.7–2.4)

## 2017-03-11 LAB — BLOOD GAS, VENOUS
Acid-Base Excess: 12.7 mmol/L — ABNORMAL HIGH (ref 0.0–2.0)
Bicarbonate: 40.3 mmol/L — ABNORMAL HIGH (ref 20.0–28.0)
FIO2: 0.21
Mode: POSITIVE
O2 Saturation: 74.9 %
PCO2 VEN: 65 mmHg — AB (ref 44.0–60.0)
PH VEN: 7.4 (ref 7.250–7.430)
Patient temperature: 37
pO2, Ven: 40 mmHg (ref 32.0–45.0)

## 2017-03-11 LAB — GLUCOSE, CAPILLARY
GLUCOSE-CAPILLARY: 104 mg/dL — AB (ref 65–99)
GLUCOSE-CAPILLARY: 98 mg/dL (ref 65–99)

## 2017-03-11 MED ORDER — PREDNISONE 10 MG (21) PO TBPK: ORAL_TABLET | ORAL | 0 refills | Status: DC

## 2017-03-11 MED ORDER — IPRATROPIUM-ALBUTEROL 0.5-2.5 (3) MG/3ML IN SOLN: 3.0000 mL | Freq: Four times a day (QID) | RESPIRATORY_TRACT | 2 refills | Status: DC

## 2017-03-11 NOTE — Care Management Note (Addendum)
Case Management Note  Patient Details  Name: Aaron Hendrix MRN: [id number] Date of Birth: 02-03-1930  Subjective/Objective:    Call to Aaron Hendrix at [facility] with Hendrix referral for HH-PT and Aide. Discussed discharge planning with Aaron Hendrix who reports that Aaron Hendrix is ambulating and maintaining his 02 Sats without supplemental oxygen. Call to [facility] at [facility] per Trilogy machine and Aaron Hendrix reports that [facility] is awaiting approval from [facility] for Hendrix Trilogy machine. All paperwork for Hendrix Trilogy was faxed to [facility] yesterday. Aaron Hendrix was updated about Trilogy pending authorization from [facility]. Aaron Hendrix has been instructed to use his CPAP machine at home until the Trilogy is approved. .              Action/Plan:   Expected Discharge Date:  03/11/17               Expected Discharge Plan:     In-House Referral:     Discharge planning Services  CM Consult  Post Acute Care Choice:  Durable Medical Equipment, Home Health Choice offered to:  Patient, Spouse, Adult Children  DME Arranged:  Oxygen, NIV DME Agency:  [place] Arranged:  RN, PT, Respirator Therapy [place] Agency:  [facility] (now [facility]), [facility] [employer]    [place] for [place].   Status of Service:  In process, will continue to follow  Completed  If discussed at Long Length of Stay Meetings, dates discussed:    Additional Comments:  Aaron Stamos A, RN 03/11/2017, 11:05 AM

## 2017-03-11 NOTE — Discharge Instructions (Addendum)
[place] at [place]:  Cardiac diet, dysphagia 3 diet  DISCHARGE CONDITION:  Stable  ACTIVITY:  Activity as tolerated  OXYGEN:  Home Oxygen: Yes.     Oxygen Delivery: room air, cpap at qhs  DISCHARGE LOCATION:  home    ADDITIONAL DISCHARGE INSTRUCTION: trilogy to be delivered to home once insurance authorization   If you experience worsening of your admission symptoms, develop shortness of breath, life threatening emergency, suicidal or homicidal thoughts you must seek medical attention immediately by calling 911 or calling your MD immediately  if symptoms less severe.  You Must read complete instructions/literature along with all the possible adverse reactions/side effects for all the Medicines you take and that have been prescribed to you. Take any new Medicines after you have completely understood and accpet all the possible adverse reactions/side effects.   Please note  You were cared for by a hospitalist during your hospital stay. If you have any questions about your discharge medications or the care you received while you were in the hospital after you are discharged, you can call the unit and asked to speak with the hospitalist on call if the hospitalist that took care of you is not available. Once you are discharged, your primary care physician will handle any further medical issues. Please note that NO REFILLS for any discharge medications will be authorized once you are discharged, as it is imperative that you return to your primary care physician (or establish a relationship with a primary care physician if you do not have one) for your aftercare needs so that they can reassess your need for medications and monitor your lab values.

## 2017-03-11 NOTE — Progress Notes (Signed)
Pt alert this shift. Up to bathroom with assistance. Tolerating bipap at night. Remaining on room air.

## 2017-03-11 NOTE — Progress Notes (Signed)
Patient is being discharge home with family. Scripts given and instructed on. Last dose of meds given. Follow-up appointment, family to make on Monday. IV removed with cath intact. Allowed time for questions.

## 2017-03-11 NOTE — Discharge Summary (Signed)
[place] at [facility], 83 y.o., DOB August 10, 1930, MRN [id number]. Admission date: 03/08/2017 Discharge Date 03/11/2017 Primary MD [name], MD Admitting Physician Fritzi Mandes, MD  Admission Diagnosis  Confusion [R41.0] Hypercapnia [R06.89] Altered mental status, unspecified altered mental status type [R41.82]  Discharge Diagnosis   Active Problems:   Acute on chronic respiratory failure with hypercapnia ([place])   Acute on chronic COPD exasperation Diabetes type 2   Memory loss Dementia Hypothyroidism GERD        Hospital Course patient is an 83 year old well known to our service for admissions related to COPD. Who is on CPAP at home was noncompliant with it. Presented with shortness of breath. He was found to be obtained at and unresponsive his CO2 was 121 and he was placed on BiPAP. Patient was slowly subsequently taken off BiPAP. Currently he is on room air. He has been suggested to be placed on BiPAP at home. Currently authorization from insurance is pending. He'll need to continue to use CPAP at bedtime. Patient doing much better and is stable for discharge.            Consults  pulmonary/intensive care  Significant Tests:  See full reports for all details     Ct Head Wo Contrast  Result Date: 03/08/2017 CLINICAL DATA:  Altered mental status for several hours EXAM: CT HEAD WITHOUT CONTRAST TECHNIQUE: Contiguous axial images were obtained from the base of the skull through the vertex without intravenous contrast. COMPARISON:  06/25/2015 FINDINGS: Brain: Diffuse atrophic changes and chronic white matter ischemic change is noted. No findings to suggest acute hemorrhage, acute infarction or space-occupying mass lesion are seen. Vascular: No hyperdense vessel or unexpected calcification. Skull: Normal. Negative for fracture or focal lesion. Sinuses/Orbits: No acute finding. Other: None. IMPRESSION: Chronic atrophic and  ischemic changes without acute abnormality. Electronically Signed   By: Inez Catalina M.D.   On: 03/08/2017 10:13   Dg Chest Portable 1 View  Result Date: 03/08/2017 CLINICAL DATA:  Altered mental status. History of hypertension, diabetes and COPD. EXAM: PORTABLE CHEST 1 VIEW COMPARISON:  Radiographs 12/23/2016.  CT 11/25/2016. FINDINGS: 0944 hour. Persistent suboptimal inspiration with resulting bibasilar atelectasis, similar to previous study. There is no confluent airspace opacity, edema, pleural effusion or pneumothorax. The heart size and mediastinal contours are stable. No acute osseous findings are seen. Telemetry leads overlie the chest. IMPRESSION: Stable chest with mild bibasilar atelectasis. No acute cardiopulmonary process. Electronically Signed   By: Richardean Sale M.D.   On: 03/08/2017 09:59   US Abdomen Limited Ruq  Result Date: 03/06/2017 CLINICAL DATA:  Right upper quadrant abdominal pain. EXAM: ULTRASOUND ABDOMEN LIMITED RIGHT UPPER QUADRANT COMPARISON:  Prior examination 12/23/2016. FINDINGS: Gallbladder: Small mobile gallstones are again noted. There is no gallbladder wall thickening, pericholecystic fluid or reported sonographic Murphy's sign. Common bile duct: Diameter: 3 mm Liver: No focal lesion identified. Within normal limits in parenchymal echogenicity. IMPRESSION: Cholelithiasis without evidence of cholecystitis or biliary dilatation. Electronically Signed   By: Richardean Sale M.D.   On: 03/06/2017 11:33       Today   Subjective:   Aaron Hendrix  is breathing much improved  Objective:   Blood pressure (!) 181/87, pulse 83, temperature 97.8 F (36.6 C), temperature source Oral, resp. rate 18, height 5\' 4"  (1.626 m), weight 138 lb 7.2 oz (62.8 kg), SpO2 93 %.  .  Intake/Output Summary (Last 24 hours) at 03/11/17 1428 Last data filed at 03/11/17  0800  Gross per 24 hour  Intake              480 ml  Output              250 ml  Net              230 ml     Exam VITAL SIGNS: Blood pressure (!) 181/87, pulse 83, temperature 97.8 F (36.6 C), temperature source Oral, resp. rate 18, height 5\' 4"  (1.626 m), weight 138 lb 7.2 oz (62.8 kg), SpO2 93 %.  GENERAL:  83 y.o.-year-old patient lying in the bed with no acute distress.  EYES: Pupils equal, round, reactive to light and accommodation. No scleral icterus. Extraocular muscles intact.  HEENT: Head atraumatic, normocephalic. Oropharynx and nasopharynx clear.  NECK:  Supple, no jugular venous distention. No thyroid enlargement, no tenderness.  LUNGS: Normal breath sounds bilaterally, no wheezing, rales,rhonchi or crepitation. No use of accessory muscles of respiration.  CARDIOVASCULAR: S1, S2 normal. No murmurs, rubs, or gallops.  ABDOMEN: Soft, nontender, nondistended. Bowel sounds present. No organomegaly or mass.  EXTREMITIES: No pedal edema, cyanosis, or clubbing.  NEUROLOGIC: Cranial nerves II through XII are intact. Muscle strength 5/5 in all extremities. Sensation intact. Gait not checked.  PSYCHIATRIC: The patient is alert and oriented x 3.  SKIN: No obvious rash, lesion, or ulcer.   Data Review     CBC w Diff: Lab Results  Component Value Date   WBC 5.2 03/09/2017   HGB 11.9 (L) 03/09/2017   HGB 13.5 06/03/2014   HCT 37.4 (L) 03/09/2017   HCT 42.2 06/03/2014   PLT 254 03/09/2017   PLT 228 06/03/2014   LYMPHOPCT 16.3 01/09/2017   LYMPHOPCT 4.3 06/03/2014   MONOPCT 10.2 01/09/2017   MONOPCT 13.3 06/03/2014   EOSPCT 1.8 01/09/2017   EOSPCT 0.0 06/03/2014   BASOPCT 1.4 01/09/2017   BASOPCT 0.2 06/03/2014   CMP: Lab Results  Component Value Date   NA 137 03/11/2017   NA 132 (L) 06/03/2014   K 4.4 03/11/2017   K 4.6 06/03/2014   CL 95 (L) 03/11/2017   CL 96 (L) 06/03/2014   CO2 36 (H) 03/11/2017   CO2 30 06/03/2014   BUN 27 (H) 03/11/2017   BUN 29 (H) 06/03/2014   CREATININE 0.97 03/11/2017   CREATININE 0.84 02/06/2015   PROT 6.6 03/08/2017   PROT 8.1 06/01/2014    ALBUMIN 3.3 (L) 03/08/2017   ALBUMIN 4.1 06/01/2014   BILITOT 0.5 03/08/2017   BILITOT 0.7 06/01/2014   ALKPHOS 54 03/08/2017   ALKPHOS 82 06/01/2014   AST 18 03/08/2017   AST 43 (H) 06/01/2014   ALT 9 (L) 03/08/2017   ALT 24 06/01/2014  .  Micro Results Recent Results (from the past 240 hour(s))  MRSA PCR Screening     Status: None   Collection Time: 03/08/17  7:04 PM  Result Value Ref Range Status   MRSA by PCR NEGATIVE NEGATIVE Final    Comment:        The GeneXpert MRSA Assay (FDA approved for NASAL specimens only), is one component of a comprehensive MRSA colonization surveillance program. It is not intended to diagnose MRSA infection nor to guide or monitor treatment for MRSA infections.         Code Status Orders        Start     Ordered   03/08/17 1600  Full code  Continuous     03/08/17 1559  Code Status History    Date Active Date Inactive Code Status Order ID Comments User Context   11/25/2016  8:44 PM 11/27/2016  2:55 PM Full Code [id number]  Demetrios Loll, MD Inpatient   07/16/2015  3:08 PM 07/22/2015  5:44 PM Full Code [id number]  Hillary Bow, MD ED   06/25/2015  8:14 PM 07/06/2015  5:57 PM Full Code [id number]  Gladstone Lighter, MD Inpatient   11/13/2012  5:41 PM 11/16/2012  1:37 PM Full Code [id number]  Mcarthur Rossetti, MD Inpatient    Advance Directive Documentation     Most Recent Value  Type of Advance Directive  Living will  Pre-existing out of facility DNR order (yellow form or pink MOST form)  -  "MOST" Form in Place?  -          Follow-up Information    [name], MD Follow up in 1 week(s).   Specialty:  Internal Medicine Contact information: [address] [place] [address] [phone]        [name], MD Follow up in 1 week(s).   Specialty:  Pulmonary Disease Contact information: [place] [address] [phone]           Discharge  Medications   Allergies as of 03/11/2017      Reactions   Fexofenadine Other (See Comments)   Quinapril Hcl Other (See Comments)   Unknown    Latex Rash      Medication List    TAKE these medications   albuterol 108 (90 Base) MCG/ACT inhaler Commonly known as:  PROAIR HFA Inhale 1-2 puffs into the lungs every 6 (six) hours as needed for wheezing or shortness of breath.   finasteride 5 MG tablet Commonly known as:  PROSCAR Take 1 tablet (5 mg total) by mouth daily.   furosemide 20 MG tablet Commonly known as:  LASIX Take 1 tablet (20 mg total) by mouth daily. What changed:  how much to take   ipratropium-albuterol 0.5-2.5 (3) MG/3ML Soln Commonly known as:  DUONEB Take 3 mLs by nebulization every 6 (six) hours. What changed:  when to take this   levothyroxine 50 MCG tablet Commonly known as:  SYNTHROID, LEVOTHROID Take 1 tablet (50 mcg total) by mouth daily before breakfast.   losartan 50 MG tablet Commonly known as:  COZAAR Take 1 tablet (50 mg total) by mouth daily.   pantoprazole 40 MG tablet Commonly known as:  PROTONIX Take 1 tablet (40 mg total) by mouth daily.   predniSONE 10 MG (21) Tbpk tablet Commonly known as:  STERAPRED UNI-PAK 21 TAB Start at 60mg  taper by 10mg  until complete   saccharomyces boulardii 250 MG capsule Commonly known as:  FLORASTOR Take 250 mg by mouth as needed.   tamsulosin 0.4 MG Caps capsule Commonly known as:  FLOMAX Take 1 capsule (0.4 mg total) by mouth daily.   umeclidinium-vilanterol 62.5-25 MCG/INH Aepb Commonly known as:  ANORO ELLIPTA Inhale 1 puff into the lungs daily.   Vitamin D3 50000 units Caps Take 50,000 Units by mouth once a week.          Total Time in preparing paper work, data evaluation and todays exam - 35 minutes  Dustin Flock M.D on 03/11/2017 at 2:28 PM  [facility]   Office  [phone]

## 2017-03-12 LAB — BLOOD GAS, ARTERIAL
Acid-Base Excess: 14.9 mmol/L — ABNORMAL HIGH (ref 0.0–2.0)
Bicarbonate: 42.2 mmol/L — ABNORMAL HIGH (ref 20.0–28.0)
Delivery systems: POSITIVE
EXPIRATORY PAP: 12
FIO2: 0.25
INSPIRATORY PAP: 30
LHR: 30 {breaths}/min
O2 SAT: 95.6 %
PATIENT TEMPERATURE: 37
PCO2 ART: 65 mmHg — AB (ref 32.0–48.0)
PO2 ART: 78 mmHg — AB (ref 83.0–108.0)
pH, Arterial: 7.42 (ref 7.350–7.450)

## 2017-03-14 ENCOUNTER — Telehealth: Payer: Self-pay | Admitting: Internal Medicine

## 2017-03-14 NOTE — Telephone Encounter (Signed)
Transition Care Management Follow-up Telephone Call  How have you been since you were released from the hospital? Patient DPR staled he has been doing very well since he has been home.   Do you understand why you were in the hospital? Yes   Do you understand the discharge instrcutions? Yes  Items Reviewed:  Medications reviewed: yes  Allergies reviewed: yes  Dietary changes reviewed:yes  Referrals reviewed: yes   Functional Questionnaire:   Activities of Daily Living (ADLs):   He states they are independent in the following: Needs assist with bathing and clothing wife helps patient. States they require assistance with the following: Patient wife assist's with ADLs.   Any transportation issues/concerns?:Yes has to rely on family for transportation.   Any patient concerns? No   Confirmed importance and date/time of follow-up visits scheduled: Yes, needs appointment date .   Confirmed with patient if condition begins to worsen call PCP or go to the ER.  Patient was given the Call-a-Nurse line [phone]: Yes

## 2017-03-14 NOTE — Telephone Encounter (Signed)
Needs appointment date time I see nothing available.

## 2017-03-15 ENCOUNTER — Telehealth: Payer: Self-pay | Admitting: Pulmonary Disease

## 2017-03-15 ENCOUNTER — Telehealth: Payer: Self-pay | Admitting: Internal Medicine

## 2017-03-15 NOTE — Telephone Encounter (Signed)
Georgeanne Nim from Palliative care called and is looking for orders physical therapy, skilled nursing, and a CNA. The patient would like to use [facility]. She was also calling to follow up on a HFU date and time. Please advise, thank you!  Call [place] @ [phone]

## 2017-03-15 NOTE — Telephone Encounter (Signed)
Called Magda Paganini what she was requesting: PT for mobility and strength training and endurance.  Skilled nursing for Medication management. And Nursing assistance with ADL Would like [place] to fax orders to [facility] and call patient with hospital follow up date/time

## 2017-03-15 NOTE — Telephone Encounter (Signed)
1st available provider 30 min slot next 2-4 weeks.

## 2017-03-15 NOTE — Telephone Encounter (Signed)
Please advise of appt for hosp f/u

## 2017-03-16 ENCOUNTER — Telehealth: Payer: Self-pay | Admitting: *Deleted

## 2017-03-16 NOTE — Telephone Encounter (Signed)
Pt scheduled 03/20/17 @3 :30

## 2017-03-16 NOTE — Telephone Encounter (Signed)
FYI [facility] will have a RN visit pt on 03/17/17 to start Home health

## 2017-03-16 NOTE — Telephone Encounter (Signed)
Ok for orders? 

## 2017-03-16 NOTE — Telephone Encounter (Signed)
fyi

## 2017-03-16 NOTE — Telephone Encounter (Signed)
FYI

## 2017-03-16 NOTE — Telephone Encounter (Signed)
Left message to return call to our office.  

## 2017-03-17 NOTE — Telephone Encounter (Signed)
Called and gave verbal ok on voice mail. Informed to call if any questions.

## 2017-03-19 ENCOUNTER — Emergency Department: Payer: Medicare Other

## 2017-03-19 ENCOUNTER — Inpatient Hospital Stay
Admission: EM | Admit: 2017-03-19 | Discharge: 2017-03-23 | DRG: 190 | Disposition: A | Discharge status: Home / Self Care | Payer: Medicare Other | Attending: Internal Medicine | Admitting: Internal Medicine

## 2017-03-19 ENCOUNTER — Encounter: Payer: Self-pay | Admitting: Emergency Medicine

## 2017-03-19 DIAGNOSIS — J441 Chronic obstructive pulmonary disease with (acute) exacerbation: Secondary | ICD-10-CM | POA: Diagnosis present

## 2017-03-19 DIAGNOSIS — J9691 Respiratory failure, unspecified with hypoxia: Secondary | ICD-10-CM | POA: Diagnosis present

## 2017-03-19 DIAGNOSIS — G9341 Metabolic encephalopathy: Secondary | ICD-10-CM | POA: Diagnosis present

## 2017-03-19 DIAGNOSIS — J9621 Acute and chronic respiratory failure with hypoxia: Secondary | ICD-10-CM | POA: Diagnosis present

## 2017-03-19 DIAGNOSIS — K589 Irritable bowel syndrome without diarrhea: Secondary | ICD-10-CM | POA: Diagnosis present

## 2017-03-19 DIAGNOSIS — I10 Essential (primary) hypertension: Secondary | ICD-10-CM | POA: Diagnosis present

## 2017-03-19 DIAGNOSIS — D649 Anemia, unspecified: Secondary | ICD-10-CM | POA: Diagnosis present

## 2017-03-19 DIAGNOSIS — Z9981 Dependence on supplemental oxygen: Secondary | ICD-10-CM | POA: Diagnosis not present

## 2017-03-19 DIAGNOSIS — E039 Hypothyroidism, unspecified: Secondary | ICD-10-CM | POA: Diagnosis present

## 2017-03-19 DIAGNOSIS — M1990 Unspecified osteoarthritis, unspecified site: Secondary | ICD-10-CM | POA: Diagnosis present

## 2017-03-19 DIAGNOSIS — E785 Hyperlipidemia, unspecified: Secondary | ICD-10-CM | POA: Diagnosis present

## 2017-03-19 DIAGNOSIS — J9622 Acute and chronic respiratory failure with hypercapnia: Secondary | ICD-10-CM | POA: Diagnosis present

## 2017-03-19 DIAGNOSIS — J9602 Acute respiratory failure with hypercapnia: Secondary | ICD-10-CM | POA: Diagnosis not present

## 2017-03-19 DIAGNOSIS — E873 Alkalosis: Secondary | ICD-10-CM | POA: Diagnosis present

## 2017-03-19 DIAGNOSIS — Z888 Allergy status to other drugs, medicaments and biological substances status: Secondary | ICD-10-CM

## 2017-03-19 DIAGNOSIS — E559 Vitamin D deficiency, unspecified: Secondary | ICD-10-CM | POA: Diagnosis present

## 2017-03-19 DIAGNOSIS — Z96641 Presence of right artificial hip joint: Secondary | ICD-10-CM | POA: Diagnosis present

## 2017-03-19 DIAGNOSIS — R413 Other amnesia: Secondary | ICD-10-CM | POA: Diagnosis present

## 2017-03-19 DIAGNOSIS — K219 Gastro-esophageal reflux disease without esophagitis: Secondary | ICD-10-CM | POA: Diagnosis present

## 2017-03-19 DIAGNOSIS — J9612 Chronic respiratory failure with hypercapnia: Secondary | ICD-10-CM | POA: Diagnosis not present

## 2017-03-19 DIAGNOSIS — Z91040 Latex allergy status: Secondary | ICD-10-CM

## 2017-03-19 DIAGNOSIS — E1142 Type 2 diabetes mellitus with diabetic polyneuropathy: Secondary | ICD-10-CM | POA: Diagnosis present

## 2017-03-19 DIAGNOSIS — J9601 Acute respiratory failure with hypoxia: Secondary | ICD-10-CM

## 2017-03-19 DIAGNOSIS — E871 Hypo-osmolality and hyponatremia: Secondary | ICD-10-CM | POA: Diagnosis present

## 2017-03-19 DIAGNOSIS — Z79899 Other long term (current) drug therapy: Secondary | ICD-10-CM | POA: Diagnosis not present

## 2017-03-19 DIAGNOSIS — Z87891 Personal history of nicotine dependence: Secondary | ICD-10-CM | POA: Diagnosis not present

## 2017-03-19 DIAGNOSIS — R0689 Other abnormalities of breathing: Secondary | ICD-10-CM | POA: Diagnosis present

## 2017-03-19 DIAGNOSIS — J9692 Respiratory failure, unspecified with hypercapnia: Secondary | ICD-10-CM

## 2017-03-19 DIAGNOSIS — N400 Enlarged prostate without lower urinary tract symptoms: Secondary | ICD-10-CM | POA: Diagnosis present

## 2017-03-19 DIAGNOSIS — J9611 Chronic respiratory failure with hypoxia: Secondary | ICD-10-CM | POA: Diagnosis not present

## 2017-03-19 LAB — TROPONIN I

## 2017-03-19 LAB — CBC WITH DIFFERENTIAL/PLATELET
Basophils Absolute: 0 10*3/uL (ref 0–0.1)
Basophils Relative: 1 %
EOS PCT: 3 %
Eosinophils Absolute: 0.2 10*3/uL (ref 0–0.7)
HCT: 36.8 % — ABNORMAL LOW (ref 40.0–52.0)
Hemoglobin: 12.1 g/dL — ABNORMAL LOW (ref 13.0–18.0)
LYMPHS ABS: 1.3 10*3/uL (ref 1.0–3.6)
LYMPHS PCT: 14 %
MCH: 30.4 pg (ref 26.0–34.0)
MCHC: 32.9 g/dL (ref 32.0–36.0)
MCV: 92.5 fL (ref 80.0–100.0)
Monocytes Absolute: 1.1 10*3/uL — ABNORMAL HIGH (ref 0.2–1.0)
Monocytes Relative: 11 %
Neutro Abs: 6.9 10*3/uL — ABNORMAL HIGH (ref 1.4–6.5)
Neutrophils Relative %: 71 %
PLATELETS: 281 10*3/uL (ref 150–440)
RBC: 3.97 MIL/uL — AB (ref 4.40–5.90)
RDW: 13.8 % (ref 11.5–14.5)
WBC: 9.5 10*3/uL (ref 3.8–10.6)

## 2017-03-19 LAB — URINALYSIS, COMPLETE (UACMP) WITH MICROSCOPIC
BILIRUBIN URINE: NEGATIVE
Bacteria, UA: NONE SEEN
Glucose, UA: NEGATIVE mg/dL
HGB URINE DIPSTICK: NEGATIVE
Ketones, ur: NEGATIVE mg/dL
LEUKOCYTES UA: NEGATIVE
NITRITE: NEGATIVE
Protein, ur: NEGATIVE mg/dL
Specific Gravity, Urine: 1.005 (ref 1.005–1.030)
WBC UA: NONE SEEN WBC/hpf (ref 0–5)
pH: 7 (ref 5.0–8.0)

## 2017-03-19 LAB — GLUCOSE, CAPILLARY
GLUCOSE-CAPILLARY: 99 mg/dL (ref 65–99)
GLUCOSE-CAPILLARY: 117 mg/dL — AB (ref 65–99)

## 2017-03-19 LAB — COMPREHENSIVE METABOLIC PANEL
ALK PHOS: 51 U/L (ref 38–126)
ALT: 11 U/L — AB (ref 17–63)
AST: 17 U/L (ref 15–41)
Albumin: 3.6 g/dL (ref 3.5–5.0)
Anion gap: 5 (ref 5–15)
BUN: 22 mg/dL — ABNORMAL HIGH (ref 6–20)
CALCIUM: 8.9 mg/dL (ref 8.9–10.3)
CHLORIDE: 91 mmol/L — AB (ref 101–111)
CO2: 37 mmol/L — ABNORMAL HIGH (ref 22–32)
CREATININE: 1.14 mg/dL (ref 0.61–1.24)
GFR calc Af Amer: >60 mL/min (ref >60)
GFR, EST NON AFRICAN AMERICAN: 57 mL/min — AB (ref >60)
Glucose, Bld: 67 mg/dL (ref 65–99)
Potassium: 4.2 mmol/L (ref 3.5–5.1)
Sodium: 133 mmol/L — ABNORMAL LOW (ref 135–145)
TOTAL PROTEIN: 6.5 g/dL (ref 6.5–8.1)
Total Bilirubin: 0.4 mg/dL (ref 0.3–1.2)

## 2017-03-19 LAB — AMMONIA

## 2017-03-19 MED ORDER — INSULIN ASPART 100 UNIT/ML ~~LOC~~ SOLN
0.0000 [IU] | Freq: Three times a day (TID) | SUBCUTANEOUS | Status: DC
  Administered 2017-03-20 – 2017-03-21 (×4): 2 [IU] via SUBCUTANEOUS
  Administered 2017-03-22 – 2017-03-23 (×3): 1 [IU] via SUBCUTANEOUS
  Filled 2017-03-19 (×7): qty 1

## 2017-03-19 MED ORDER — POLYETHYLENE GLYCOL 3350 17 G PO PACK: 17.0000 g | PACK | Freq: Every day | ORAL | Status: DC | PRN

## 2017-03-19 MED ORDER — ONDANSETRON HCL 4 MG PO TABS
4.0000 mg | ORAL_TABLET | Freq: Four times a day (QID) | ORAL | Status: DC | PRN
Start: 2017-03-19 — End: 2017-03-23

## 2017-03-19 MED ORDER — ORAL CARE MOUTH RINSE
15.0000 mL | Freq: Two times a day (BID) | OROMUCOSAL | Status: DC
  Administered 2017-03-20 – 2017-03-22 (×4): 15 mL via OROMUCOSAL

## 2017-03-19 MED ORDER — ALBUTEROL SULFATE (2.5 MG/3ML) 0.083% IN NEBU: 5.0000 mg | INHALATION_SOLUTION | Freq: Once | RESPIRATORY_TRACT | Status: DC

## 2017-03-19 MED ORDER — METHYLPREDNISOLONE SODIUM SUCC 125 MG IJ SOLR
60.0000 mg | INTRAMUSCULAR | Status: DC
  Administered 2017-03-19 – 2017-03-20 (×2): 60 mg via INTRAVENOUS
  Filled 2017-03-19 (×2): qty 2

## 2017-03-19 MED ORDER — ALBUTEROL SULFATE (2.5 MG/3ML) 0.083% IN NEBU: 2.5000 mg | INHALATION_SOLUTION | RESPIRATORY_TRACT | Status: DC | PRN

## 2017-03-19 MED ORDER — ACETAMINOPHEN 325 MG PO TABS: 650.0000 mg | ORAL_TABLET | Freq: Four times a day (QID) | ORAL | Status: DC | PRN

## 2017-03-19 MED ORDER — ENOXAPARIN SODIUM 40 MG/0.4ML ~~LOC~~ SOLN
40.0000 mg | SUBCUTANEOUS | Status: DC
  Administered 2017-03-19 – 2017-03-22 (×4): 40 mg via SUBCUTANEOUS
  Filled 2017-03-19 (×4): qty 0.4

## 2017-03-19 MED ORDER — ONDANSETRON HCL 4 MG/2ML IJ SOLN: 4.0000 mg | Freq: Four times a day (QID) | INTRAMUSCULAR | Status: DC | PRN

## 2017-03-19 MED ORDER — CHLORHEXIDINE GLUCONATE 0.12 % MT SOLN
15.0000 mL | Freq: Two times a day (BID) | OROMUCOSAL | Status: DC
  Administered 2017-03-19 – 2017-03-23 (×3): 15 mL via OROMUCOSAL
  Filled 2017-03-19 (×2): qty 15

## 2017-03-19 MED ORDER — ACETAMINOPHEN 650 MG RE SUPP: 650.0000 mg | Freq: Four times a day (QID) | RECTAL | Status: DC | PRN

## 2017-03-19 NOTE — H&P (Signed)
Aaron Hendrix at [place] NAME: Aaron Hendrix    MR#:  [id number]  DATE OF BIRTH:  Jun 24, 1930  DATE OF ADMISSION:  03/19/2017  PRIMARY CARE PHYSICIAN: [name], MD   REQUESTING/REFERRING PHYSICIAN: Dr. Burlene Arnt  CHIEF COMPLAINT:  Increasing shortness of breath confusion and restlessness  HISTORY OF PRESENT ILLNESS:  Aaron Hendrix  is a 83 y.o. male with a known history of End-stage COPD with history of chronic hypercarbia on chronic home oxygen and CPAP at nighttime comes to the emergency room with increasing confusion and shortness of breath. Patient is very restless and fidgety. Constantly keeps his eyes closed tight. He was found to have PCO2 of 74 on ABG. He is currently on BiPAP. His sats are 100%. Blood pressure is stable. Family is working with patient's insurance company to get BiPAP/NIV at home.... So far not able to do so. Patient was just discharged on 03/14/2017 with similar symptoms.  He is being admitted for acute on chronic hypoxic hypercarbic respiratory failure secondary to severe COPD/end-stage  PAST MEDICAL HISTORY:   Past Medical History:  Diagnosis Date  . Carpal tunnel syndrome   . COPD with asthma ([place])   . Diabetes mellitus    Diet control   . DJD (degenerative joint disease), cervical   . Duodenal ulcer, with partial obstruction 08/10/2012  . Essential and other specified forms of tremor 12/16/2013  . Hyperlipidemia   . Hypertension    no medicine needed  . IBS (irritable bowel syndrome)   . Internal hemorrhoids   . Memory deficit 12/16/2013  . Numbness and tingling in right hand    started 2 yeas ago  . Peptic ulcer   . Personal history of colonic polyps    adenomatous  . Polyneuropathy in diabetes(357.2)   . Tremor, essential   . Vitamin D deficiency     PAST SURGICAL HISTOIRY:   Past Surgical History:  Procedure Laterality Date  . ANAL FISSURE REPAIR    . ANKLE SURGERY Right     right- pins placed in  . COLONOSCOPY W/ BIOPSIES AND POLYPECTOMY  8/03, 6/05, 7/09, 9/10   internal hemorrhoids, tubular adenomas, mucosa & lymphoid nodules  . INGUINAL HERNIA REPAIR Right   . TOTAL HIP ARTHROPLASTY Right 11/13/2012   Procedure: TOTAL HIP ARTHROPLASTY ANTERIOR APPROACH;  Surgeon: Mcarthur Rossetti, MD;  Location: [facility];  Service: Orthopedics;  Laterality: Right;  Right total hip arthroplasty  . UPPER GASTROINTESTINAL ENDOSCOPY  3/05, 7/09, 9/10,2013   gastritis, duodenitis    SOCIAL HISTORY:   Social History  Substance Use Topics  . Smoking status: Former Smoker    Types: Cigarettes    Quit date: 07/02/1963  . Smokeless tobacco: Never Used  . Alcohol use No    FAMILY HISTORY:   Family History  Problem Relation Age of Onset  . Thyroid cancer Daughter 56  . Diabetes Son   . Urolithiasis Son   . Leukemia Maternal Aunt   . Colon cancer Neg Hx   . Esophageal cancer Neg Hx   . Rectal cancer Neg Hx   . Stomach cancer Neg Hx   . Prostate cancer Neg Hx   . Kidney disease Neg Hx   . Kidney cancer Neg Hx     DRUG ALLERGIES:   Allergies  Allergen Reactions  . Fexofenadine Other (See Comments)  . Quinapril Hcl Other (See Comments)    Unknown   . Latex Rash    REVIEW OF  SYSTEMS:  Review of Systems  Unable to perform ROS: Medical condition     MEDICATIONS AT HOME:   Prior to Admission medications   Medication Sig Start Date End Date Taking? Authorizing Provider  albuterol (PROAIR HFA) 108 (90 Base) MCG/ACT inhaler Inhale 1-2 puffs into the lungs every 6 (six) hours as needed for wheezing or shortness of breath. 07/07/16  Yes [name], MD  Cholecalciferol (VITAMIN D3) 50000 units CAPS Take 50,000 Units by mouth once a week.   Yes [provider]  finasteride (PROSCAR) 5 MG tablet Take 1 tablet (5 mg total) by mouth daily. 11/14/16  Yes [name], [name], PA-C  furosemide (LASIX) 20 MG tablet Take 1 tablet (20 mg total) by mouth  daily. Patient taking differently: Take 40 mg by mouth daily.  07/22/15  Yes Max Sane, MD  ipratropium-albuterol (DUONEB) 0.5-2.5 (3) MG/3ML SOLN Take 3 mLs by nebulization every 6 (six) hours. 03/11/17  Yes Dustin Flock, MD  levothyroxine (SYNTHROID, LEVOTHROID) 50 MCG tablet Take 1 tablet (50 mcg total) by mouth daily before breakfast. 12/02/16  Yes [name], MD  losartan (COZAAR) 50 MG tablet Take 1 tablet (50 mg total) by mouth daily. 12/02/16  Yes [name], MD  pantoprazole (PROTONIX) 40 MG tablet Take 1 tablet (40 mg total) by mouth daily. 12/02/16  Yes [name], MD  saccharomyces boulardii (FLORASTOR) 250 MG capsule Take 250 mg by mouth as needed.    Yes [provider]  tamsulosin (FLOMAX) 0.4 MG CAPS capsule Take 1 capsule (0.4 mg total) by mouth daily. 11/14/16  Yes [name], [name], PA-C  umeclidinium-vilanterol (ANORO ELLIPTA) 62.5-25 MCG/INH AEPB Inhale 1 puff into the lungs daily. 03/06/17  Yes [name], MD      VITAL SIGNS:  Blood pressure 124/74, pulse 61, temperature 98.5 F (36.9 C), temperature source Oral, resp. rate (!) 28, height 5\' 4"  (1.626 m), weight 62.6 kg (138 lb), SpO2 100 %.  PHYSICAL EXAMINATION:  GENERAL:  83 y.o.-year-old patient lying in the bed withMild to moderate respiratory acute distress. Chronically ill EYES: Pupils equal, round, reactive to light and accommodation. No scleral icterus. Extraocular muscles intact.  HEENT: Head atraumatic, normocephalic. Oropharynx and nasopharynx clear. BiPAP NECK:  Supple, no jugular venous distention. No thyroid enlargement, no tenderness.  LUNGS: Distant breath sounds bilaterally, no wheezing, rales,rhonchi or crepitation. No use of accessory muscles of respiration.  CARDIOVASCULAR: S1, S2 normal. No murmurs, rubs, or gallops. Tachycardia ABDOMEN: Soft, nontender, nondistended. Bowel sounds present. No organomegaly or mass.  EXTREMITIES: No pedal edema, cyanosis, or clubbing.   NEUROLOGIC: Grossly nonfocal. PSYCHIATRIC: The patient is alert and restless/confused  SKIN: No obvious rash, lesion, or ulcer.   LABORATORY PANEL:   CBC  Recent Labs Lab 03/19/17 1238  WBC 9.5  HGB 12.1*  HCT 36.8*  PLT 281   ------------------------------------------------------------------------------------------------------------------  Chemistries   Recent Labs Lab 03/19/17 1238  NA 133*  K 4.2  CL 91*  CO2 37*  GLUCOSE 67  BUN 22*  CREATININE 1.14  CALCIUM 8.9  AST 17  ALT 11*  ALKPHOS 51  BILITOT 0.4   ------------------------------------------------------------------------------------------------------------------  Cardiac Enzymes  Recent Labs Lab 03/19/17 1238  TROPONINI <0.03   ------------------------------------------------------------------------------------------------------------------  RADIOLOGY:  Dg Chest Portable 1 View  Result Date: 03/19/2017 CLINICAL DATA:  Difficulty breathing since this morning. EXAM: PORTABLE CHEST 1 VIEW COMPARISON:  03/08/2017 and 11/25/2016 FINDINGS: Lungs are adequately inflated without focal consolidation or effusion. Cardiomediastinal silhouette is within normal. Air-filled small  bowel loops immediately below the right hemidiaphragm unchanged. Mild degenerate change of the spine. IMPRESSION: No acute cardiopulmonary disease. Electronically Signed   By: Marin Olp M.D.   On: 03/19/2017 13:44    EKG:    IMPRESSION AND PLAN:   Meghan Tiemann  is a 83 y.o. male with a known history of End-stage COPD with history of chronic hypercarbia on chronic home oxygen and CPAP at nighttime comes to the emergency room with increasing confusion and shortness of breath. Patient is very restless and fidgety.   1. Acute on chronic hypoxic hypercarbic respiratory failure secondary to COPD/emphysema flare. -Admit to ICU -Continue BiPAP -Case discussed with Dr. Jefferson Fuel who will see patient in consultation -IV Solu-Medrol  60 mg daily -Nebulizer, inhaler -Patient is a full code -Patient will benefit from noninvasive ventilator/BiPAP at home to avoid frequent admissions to the hospital  2. Diabetes sliding scale insulin  3. Hypertension resume home meds  4. GERD continue PPI  5. DVT prophylaxis Lovenox  All the records are reviewed and case discussed with ED provider. Management plans discussed with the patient, family and they are in agreement.  CODE STATUS: Full  TOTAL  critical TIME TAKING CARE OF THIS PATIENT:50s.    Hartley Wyke M.D on 03/19/2017 at 3:37 PM  Between 7am to 6pm - Pager - [phone]  After 6pm go to [email or web address] - password [email or web address] [facility]  [facility]  Office  [phone]  CC: Primary care physician; [name], MD

## 2017-03-19 NOTE — Final Consult Note (Addendum)
[facility] Consultation    SYNOPSIS   Aaron Hendrix  is a 83 y.o. male with a known history of COPD who is on BiPAP at home however is noncompliant, DJD, diabetes, hypertension comes to the emergency room after he was found confused and altered mental status with unresponsiveness later on by wife at home.   ASSESSMENT/PLAN   Acute on chronic hypercapnic respiratory failure. Patient has been started on noninvasive ventilation, will be transferred to the intensive care unit, placed on albuterol, Atrovent, Solu-Medrol and would empirically cover with antibiotics. Patient has chronic hypercapnic respiratory failure and nocturnal noninvasive ventilation at night when he leaves the hospital is indicated and discuss with family about potential intubation/CPR if patient's status were to deteriorate. They wish a short-term trial if it comes to that.  Metabolic alkalosis. Compensatory to chronic hypercapnic respiratory failure  Hyponatremia. 133 saline resuscitation and follow closely  Prerenal indices. Consistent with volume contraction  Mild anemia no active bleeding noted  Name: Aaron Hendrix MRN: [id number] DOB: 18-Nov-1929    ADMISSION DATE:  03/19/2017 CONSULTATION DATE:  03/19/2017  REFERRING MD :  Hospitalist service  CHIEF COMPLAINT:  Confusion/shortness of breath/hypercapnic respiratory failure   HISTORY OF PRESENT ILLNESS:  Aaron Hendrix is an 83 year old gentleman with a past medical history remarkable for hypertension, hyperlipidemia, diabetes, gastric ulcer disease, degenerative joint disease, peripheral neuropathy secondary to diabetes, severe end-stage COPD with chronic hypercapnic respiratory failure. He was recently discharged however has been having difficulty obtaining his home BiPAP mask. His home regimen includes anoro, duonebs, CPAP, S/P steroids and antibiotics. He initially did well after his discharge however today developed progressive  confusion, increasing shortness of breath, labored breathing prompting visit to the emergency department in emergency department he was noted to have hypercapnic respiratory failure acute on chronic. He's also acutely confused with desaturation/hypoxemia. In talking with his family apparently when his CO2 levels increase about 60 he develops confusion and encephalopathy.  PAST MEDICAL HISTORY :  Past Medical History:  Diagnosis Date  . Carpal tunnel syndrome   . COPD with asthma ([place])   . Diabetes mellitus    Diet control   . DJD (degenerative joint disease), cervical   . Duodenal ulcer, with partial obstruction 08/10/2012  . Essential and other specified forms of tremor 12/16/2013  . Hyperlipidemia   . Hypertension    no medicine needed  . IBS (irritable bowel syndrome)   . Internal hemorrhoids   . Memory deficit 12/16/2013  . Numbness and tingling in right hand    started 2 yeas ago  . Peptic ulcer   . Personal history of colonic polyps    adenomatous  . Polyneuropathy in diabetes(357.2)   . Tremor, essential   . Vitamin D deficiency    Past Surgical History:  Procedure Laterality Date  . ANAL FISSURE REPAIR    . ANKLE SURGERY Right    right- pins placed in  . COLONOSCOPY W/ BIOPSIES AND POLYPECTOMY  8/03, 6/05, 7/09, 9/10   internal hemorrhoids, tubular adenomas, mucosa & lymphoid nodules  . INGUINAL HERNIA REPAIR Right   . TOTAL HIP ARTHROPLASTY Right 11/13/2012   Procedure: TOTAL HIP ARTHROPLASTY ANTERIOR APPROACH;  Surgeon: Mcarthur Rossetti, MD;  Location: [facility];  Service: Orthopedics;  Laterality: Right;  Right total hip arthroplasty  . UPPER GASTROINTESTINAL ENDOSCOPY  3/05, 7/09, 9/10,2013   gastritis, duodenitis   Prior to Admission medications   Medication Sig Start Date End Date Taking? Authorizing Provider  albuterol (  PROAIR HFA) 108 (90 Base) MCG/ACT inhaler Inhale 1-2 puffs into the lungs every 6 (six) hours as needed for wheezing or shortness of breath.  07/07/16  Yes [name], MD  Cholecalciferol (VITAMIN D3) 50000 units CAPS Take 50,000 Units by mouth once a week.   Yes [provider]  finasteride (PROSCAR) 5 MG tablet Take 1 tablet (5 mg total) by mouth daily. 11/14/16  Yes [name], [name], PA-C  furosemide (LASIX) 20 MG tablet Take 1 tablet (20 mg total) by mouth daily. Patient taking differently: Take 40 mg by mouth daily.  07/22/15  Yes Max Sane, MD  ipratropium-albuterol (DUONEB) 0.5-2.5 (3) MG/3ML SOLN Take 3 mLs by nebulization every 6 (six) hours. 03/11/17  Yes Dustin Flock, MD  levothyroxine (SYNTHROID, LEVOTHROID) 50 MCG tablet Take 1 tablet (50 mcg total) by mouth daily before breakfast. 12/02/16  Yes [name], MD  losartan (COZAAR) 50 MG tablet Take 1 tablet (50 mg total) by mouth daily. 12/02/16  Yes [name], MD  pantoprazole (PROTONIX) 40 MG tablet Take 1 tablet (40 mg total) by mouth daily. 12/02/16  Yes [name], MD  saccharomyces boulardii (FLORASTOR) 250 MG capsule Take 250 mg by mouth as needed.    Yes [provider]  tamsulosin (FLOMAX) 0.4 MG CAPS capsule Take 1 capsule (0.4 mg total) by mouth daily. 11/14/16  Yes [name], [name], PA-C  umeclidinium-vilanterol (ANORO ELLIPTA) 62.5-25 MCG/INH AEPB Inhale 1 puff into the lungs daily. 03/06/17  Yes [name], MD   Allergies  Allergen Reactions  . Fexofenadine Other (See Comments)  . Quinapril Hcl Other (See Comments)    Unknown   . Latex Rash    FAMILY HISTORY:  Family History  Problem Relation Age of Onset  . Thyroid cancer Daughter 80  . Diabetes Son   . Urolithiasis Son   . Leukemia Maternal Aunt   . Colon cancer Neg Hx   . Esophageal cancer Neg Hx   . Rectal cancer Neg Hx   . Stomach cancer Neg Hx   . Prostate cancer Neg Hx   . Kidney disease Neg Hx   . Kidney cancer Neg Hx    SOCIAL HISTORY:  reports that he quit smoking about 53 years ago. His smoking use included Cigarettes. He has never used  smokeless tobacco. He reports that he does not drink alcohol or use drugs.  REVIEW OF SYSTEMS:   Unable to obtain review of systems secondary to altered mental status/confusion  VITAL SIGNS: Patient blood pressure is 110/70, his respiratory rate is in the mid 20s, he is on noninvasive ventilation with oxygen saturation varying anywhere from 80-90%. His rhythm is normal sinus with a pulse in the 60s    INTAKE / OUTPUT: No intake or output data in the 24 hours ending 03/19/17 1504  Physical Examination:   VS: BP 124/74   Pulse 61   Temp 98.5 F (36.9 C) (Oral)   Resp (!) 28   Ht 5\' 4"  (1.626 m)   Wt 62.6 kg (138 lb)   SpO2 100%   BMI 23.69 kg/m   General Appearance: Increased work of breathing noted, confused elderly chronically ill-appearing gentleman Neuro: Limited exam, patient with acute confusional state HEENT: Trachea is midline, no thyromegaly noted, presently wearing noninvasive ventilation  Pulmonary: Prolonged expiratory phase, diffuse coarse rhonchi and wheezing Cardiovascular distant heart sounds, S1/S2 without clear auscultated murmur Abdomen: Benign, Soft, non-tender, No masses, hepatosplenomegaly, No lymphadenopathy Extremities: normal, no cyanosis, clubbing, no edema,  warm with normal capillary refill.    LABS: Reviewed   LABORATORY PANEL:   CBC  Recent Labs Lab 03/19/17 1238  WBC 9.5  HGB 12.1*  HCT 36.8*  PLT 281    Chemistries   Recent Labs Lab 03/19/17 1238  NA 133*  K 4.2  CL 91*  CO2 37*  GLUCOSE 67  BUN 22*  CREATININE 1.14  CALCIUM 8.9  AST 17  ALT 11*  ALKPHOS 51  BILITOT 0.4    No results for input(s): GLUCAP in the last 168 hours. No results for input(s): PHART, PCO2ART, PO2ART in the last 168 hours.  Recent Labs Lab 03/19/17 1238  AST 17  ALT 11*  ALKPHOS 51  BILITOT 0.4  ALBUMIN 3.6    Cardiac Enzymes  Recent Labs Lab 03/19/17 1238  TROPONINI <0.03    RADIOLOGY:  Dg Chest Portable 1 View  Result  Date: 03/19/2017 CLINICAL DATA:  Difficulty breathing since this morning. EXAM: PORTABLE CHEST 1 VIEW COMPARISON:  03/08/2017 and 11/25/2016 FINDINGS: Lungs are adequately inflated without focal consolidation or effusion. Cardiomediastinal silhouette is within normal. Air-filled small bowel loops immediately below the right hemidiaphragm unchanged. Mild degenerate change of the spine. IMPRESSION: No acute cardiopulmonary disease. Electronically Signed   By: Marin Olp M.D.   On: 03/19/2017 13:44   Hermelinda Dellen, DO ICU Pager [phone] [facility] Office Number: [phone]  Patricia Pesa, M.D.  Merton Border, M.D   03/19/2017, 3:04 PM

## 2017-03-19 NOTE — ED Provider Notes (Signed)
[facility] Emergency Department Provider Note  ____________________________________________   I have reviewed the triage vital signs and the nursing notes.   HISTORY  Chief Complaint Respiratory Distress and Altered Mental Status    HPI Aaron Hendrix is a 83 y.o. male who presents today complaining of confusion. Family states he has a history of CO2 narcosis. He has been using CPAP at night but is not yet been cleared for BiPAP during the day. He is not been coughing or wheezing however they noticed today is beginning to get confused and they're concerned that he was beginning to become narcotic from CO2 retention. Patient is awake somnolent, he can tell me his name he is unsure of the exact date   Level 5 chart caveat; no further history available due to patient status.   According to family, no wheeze, no cough no fever*   Past Medical History:  Diagnosis Date  . Carpal tunnel syndrome   . COPD with asthma ([place])   . Diabetes mellitus    Diet control   . DJD (degenerative joint disease), cervical   . Duodenal ulcer, with partial obstruction 08/10/2012  . Essential and other specified forms of tremor 12/16/2013  . Hyperlipidemia   . Hypertension    no medicine needed  . IBS (irritable bowel syndrome)   . Internal hemorrhoids   . Memory deficit 12/16/2013  . Numbness and tingling in right hand    started 2 yeas ago  . Peptic ulcer   . Personal history of colonic polyps    adenomatous  . Polyneuropathy in diabetes(357.2)   . Tremor, essential   . Vitamin D deficiency     Patient Active Problem List   Diagnosis Date Noted  . Palliative care by specialist 03/09/2017  . Hypercapnia   . Acute on chronic respiratory failure with hypercapnia ([place]) 03/08/2017  . Hypoxia 11/25/2016  . COPD exacerbation ([place]) 11/25/2016  . Rash 01/18/2016  . Nocturia 08/11/2015  . BPH with obstruction/lower urinary tract symptoms 08/11/2015  . Hematemesis  07/29/2015  . Paralytic ileus ([place]) 07/29/2015  . Lower extremity edema 07/16/2015  . Increased urinary frequency 07/16/2015  . COPD (chronic obstructive pulmonary disease) ([place]) 07/16/2015  . Malnutrition of moderate degree ([place]) 07/02/2015  . Community acquired pneumonia   . Hyponatremia 06/25/2015  . Skin lesion 02/16/2015  . DNR (do not resuscitate) discussion 02/16/2015  . Elbow abrasion 09/27/2014  . Hypothyroidism 09/27/2014  . Chronic constipation 09/01/2014  . Trigger thumb 06/22/2014  . Abdominal pain 06/22/2014  . Personal history of other specified conditions 02/20/2014  . Decreased anal sphincter tone 02/19/2014  . Other specified symptoms and signs involving the digestive system and abdomen 02/19/2014  . Diabetes mellitus ([place]) 02/19/2014  . Farts 02/19/2014  . Memory deficit 12/16/2013  . Essential and other specified forms of tremor 12/16/2013  . Amnesia 12/16/2013  . Diarrhea 10/24/2013  . Loss of weight 09/10/2013  . Periumbilical abdominal pain 09/10/2013  . Pain in lower limb 07/01/2013  . Dermatophytosis of foot 07/01/2013  . Degenerative arthritis of hip 11/13/2012  . Osteoarthritis of hip 11/13/2012  . DU (duodenal ulcer) 08/22/2012  . Nausea 08/10/2012  . Hypertension 05/25/2011  . Diabetes mellitus, type II ([place]) 05/25/2011  . Irritable bowel syndrome 03/31/2008  . History of colonic polyps 03/31/2008  . Adaptive colitis 03/31/2008    Past Surgical History:  Procedure Laterality Date  . ANAL FISSURE REPAIR    . ANKLE SURGERY Right  right- pins placed in  . COLONOSCOPY W/ BIOPSIES AND POLYPECTOMY  8/03, 6/05, 7/09, 9/10   internal hemorrhoids, tubular adenomas, mucosa & lymphoid nodules  . INGUINAL HERNIA REPAIR Right   . TOTAL HIP ARTHROPLASTY Right 11/13/2012   Procedure: TOTAL HIP ARTHROPLASTY ANTERIOR APPROACH;  Surgeon: Mcarthur Rossetti, MD;  Location: [place];  Service: Orthopedics;  Laterality: Right;  Right total hip arthroplasty   . UPPER GASTROINTESTINAL ENDOSCOPY  3/05, 7/09, 9/10,2013   gastritis, duodenitis    Prior to Admission medications   Medication Sig Start Date End Date Taking? Authorizing Provider  albuterol (PROAIR HFA) 108 (90 Base) MCG/ACT inhaler Inhale 1-2 puffs into the lungs every 6 (six) hours as needed for wheezing or shortness of breath. 07/07/16   [name], MD  Cholecalciferol (VITAMIN D3) 50000 units CAPS Take 50,000 Units by mouth once a week.    [provider]  finasteride (PROSCAR) 5 MG tablet Take 1 tablet (5 mg total) by mouth daily. 11/14/16   Zara Council A, PA-C  furosemide (LASIX) 20 MG tablet Take 1 tablet (20 mg total) by mouth daily. Patient taking differently: Take 40 mg by mouth daily.  07/22/15   Max Sane, MD  ipratropium-albuterol (DUONEB) 0.5-2.5 (3) MG/3ML SOLN Take 3 mLs by nebulization every 6 (six) hours. 03/11/17   Dustin Flock, MD  levothyroxine (SYNTHROID, LEVOTHROID) 50 MCG tablet Take 1 tablet (50 mcg total) by mouth daily before breakfast. 12/02/16   [name], MD  losartan (COZAAR) 50 MG tablet Take 1 tablet (50 mg total) by mouth daily. 12/02/16   [name], MD  pantoprazole (PROTONIX) 40 MG tablet Take 1 tablet (40 mg total) by mouth daily. 12/02/16   [name], MD  predniSONE (STERAPRED UNI-PAK 21 TAB) 10 MG (21) TBPK tablet Start at 60mg  taper by 10mg  until complete 03/11/17   Dustin Flock, MD  saccharomyces boulardii (FLORASTOR) 250 MG capsule Take 250 mg by mouth as needed.     [provider]  tamsulosin (FLOMAX) 0.4 MG CAPS capsule Take 1 capsule (0.4 mg total) by mouth daily. 11/14/16   [name], [name], PA-C  umeclidinium-vilanterol (ANORO ELLIPTA) 62.5-25 MCG/INH AEPB Inhale 1 puff into the lungs daily. 03/06/17   [name], MD    Allergies Fexofenadine; Quinapril hcl; and Latex  Family History  Problem Relation Age of Onset  . Thyroid cancer Daughter 36  . Diabetes Son   . Urolithiasis Son   .  Leukemia Maternal Aunt   . Colon cancer Neg Hx   . Esophageal cancer Neg Hx   . Rectal cancer Neg Hx   . Stomach cancer Neg Hx   . Prostate cancer Neg Hx   . Kidney disease Neg Hx   . Kidney cancer Neg Hx     Social History Social History  Substance Use Topics  . Smoking status: Former Smoker    Types: Cigarettes    Quit date: 07/02/1963  . Smokeless tobacco: Never Used  . Alcohol use No    Review of Systems Constitutional: No fever/chills Eyes: No visual changes. ENT: No sore throat. No stiff neck no neck pain Cardiovascular: Denies chest pain. Respiratory: Denies shortness of breath. Gastrointestinal:   no vomiting.  No diarrhea.  No constipation. Genitourinary: Negative for dysuria. Musculoskeletal: Negative lower extremity swelling Skin: Negative for rash. Neurological: Negative for severe headaches, focal weakness or numbness.   ____________________________________________   PHYSICAL EXAM:  VITAL SIGNS: ED Triage Vitals  Enc Vitals Group  BP --      Pulse Rate 03/19/17 1228 76     Resp 03/19/17 1228 (!) 28     Temp 03/19/17 1228 98.5 F (36.9 C)     Temp Source 03/19/17 1228 Oral     SpO2 03/19/17 1228 97 %     Weight 03/19/17 1225 138 lb (62.6 kg)     Height 03/19/17 1225 5\' 4"  (1.626 m)     Head Circumference --      Peak Flow --      Pain Score --      Pain Loc --      Pain Edu? --      Excl. in [place]? --     Constitutional: Sleepy but oriented to name and place unsure of the date. Well appearing and in no acute distress. Eyes: Conjunctivae are normal Head: Atraumatic HEENT: No congestion/rhinnorhea. Mucous membranes are moist.  Oropharynx non-erythematous Neck:   Nontender with no meningismus, no masses, no stridor Cardiovascular: Normal rate, regular rhythm. Grossly normal heart sounds.  Good peripheral circulation. Respiratory: Normal respiratory effort.  No retractions. Lungs are diminished in the bases. Abdominal: Soft and nontender. No  distention. No guarding no rebound Back:  There is no focal tenderness or step off.  there is no midline tenderness there are no lesions noted. there is no CVA tenderness Musculoskeletal: No lower extremity tenderness, no upper extremity tenderness. No joint effusions, no DVT signs strong distal pulses no edema Neurologic:  Normal speech and language. No gross focal neurologic deficits are appreciated.  Skin:  Skin is warm, dry and intact. No rash noted. Psychiatric: Mood and affect are normal. Speech and behavior are normal.  ____________________________________________   LABS (all labs ordered are listed, but only abnormal results are displayed)  Labs Reviewed  CBC WITH DIFFERENTIAL/PLATELET - Abnormal; Notable for the following:       Result Value   RBC 3.97 (*)    Hemoglobin 12.1 (*)    HCT 36.8 (*)    Neutro Abs 6.9 (*)    Monocytes Absolute 1.1 (*)    All other components within normal limits  COMPREHENSIVE METABOLIC PANEL - Abnormal; Notable for the following:    Sodium 133 (*)    Chloride 91 (*)    CO2 37 (*)    BUN 22 (*)    ALT 11 (*)    GFR calc non Af Amer 57 (*)    All other components within normal limits  AMMONIA - Abnormal; Notable for the following:    Ammonia <9 (*)    All other components within normal limits  BLOOD GAS, VENOUS - Abnormal; Notable for the following:    pCO2, Ven 74 (*)    All other components within normal limits  TROPONIN I   ____________________________________________  EKG  I personally interpreted any EKGs ordered by me or triage  ____________________________________________  RADIOLOGY  I reviewed any imaging ordered by me or triage that were performed during my shift and, if possible, patient and/or family made aware of any abnormal findings. ____________________________________________   PROCEDURES  Procedure(s) performed: None  Procedures  Critical Care performed: CRITICAL CARE Performed by: Schuyler Amor   Total critical care time: 35 minutes  Critical care time was exclusive of separately billable procedures and treating other patients.  Critical care was necessary to treat or prevent imminent or life-threatening deterioration.  Critical care was time spent personally by me on the following activities: development of treatment plan with  patient and/or surrogate as well as nursing, discussions with consultants, evaluation of patient's response to treatment, examination of patient, obtaining history from patient or surrogate, ordering and performing treatments and interventions, ordering and review of laboratory studies, ordering and review of radiographic studies, pulse oximetry and re-evaluation of patient's condition.   ____________________________________________   INITIAL IMPRESSION / ASSESSMENT AND PLAN / ED COURSE  Pertinent labs & imaging results that were available during my care of the patient were reviewed by me and considered in my medical decision making (see chart for details).  Patient in no acute distress, here for increasing confusion over the last several hours consistent with prior hypercapnia. Patient is hypercapnic. PH is reassuring that his levels are certainly high enough to cause his symptoms. We will discuss with the hospitals for an observational admission. No evidence of ACS PE or dissection No other obvious sign of patient's mild confusion is noted. He is doing well on BiPAP. Admitted to the hospitalist.   ____________________________________________   FINAL CLINICAL IMPRESSION(S) / ED DIAGNOSES  Final diagnoses:  CO2 narcosis      This chart was dictated using voice recognition software.  Despite best efforts to proofread,  errors can occur which can change meaning.      Schuyler Amor, MD 03/19/17 [phone]

## 2017-03-19 NOTE — Progress Notes (Signed)
Placed patient back on bipap for hours of sleep. He has done well off therapy on 2 liter nasal cannula.  Patient does wear cpap at home at night.

## 2017-03-19 NOTE — ED Triage Notes (Signed)
Arrives from home via [facility] for C/O confusion this morning and difficulty breathing difficulty.  Patient recently admitted to hospital for hypercapnia and was treated with Bipap.  Patient waiting for home BIPAP, but have not received it yet.

## 2017-03-19 NOTE — Progress Notes (Signed)
eLink Physician-Brief Progress Note Patient Name: Aaron Hendrix DOB: 1930/09/19 MRN: [id number]   Date of Service  03/19/2017  HPI/Events of Note  83 yr old male with severe COPD presented with acute on chronic hypercarbic resp failure related to COPD exac.  Doing better with bipap.  eICU Interventions  Chart reviewed.  No new recs     Intervention Category Evaluation Type: New Patient Evaluation  Mauri [place], P 03/19/2017, 4:41 PM

## 2017-03-20 ENCOUNTER — Ambulatory Visit: Payer: Medicare Other | Admitting: Internal Medicine

## 2017-03-20 LAB — PROCALCITONIN

## 2017-03-20 LAB — BLOOD GAS, VENOUS
Acid-base deficit: 1.9 mmol/L (ref 0.0–2.0)
Bicarbonate: 20.7 mmol/L (ref 20.0–28.0)
PATIENT TEMPERATURE: 37
Patient temperature: 37
pCO2, Ven: 74 mmHg (ref 44.0–60.0)
pCO2, Ven: 26 mmHg — ABNORMAL LOW (ref 44.0–60.0)
pH, Ven: 7.36 (ref 7.250–7.430)
pH, Ven: 7.51 — ABNORMAL HIGH (ref 7.250–7.430)
pO2, Ven: <31 mmHg — CL (ref 32.0–45.0)

## 2017-03-20 LAB — CBC
HEMATOCRIT: 40.7 % (ref 40.0–52.0)
Hemoglobin: 13.3 g/dL (ref 13.0–18.0)
MCH: 30.7 pg (ref 26.0–34.0)
MCHC: 32.6 g/dL (ref 32.0–36.0)
MCV: 94.4 fL (ref 80.0–100.0)
Platelets: 290 10*3/uL (ref 150–440)
RBC: 4.31 MIL/uL — AB (ref 4.40–5.90)
RDW: 13.5 % (ref 11.5–14.5)
WBC: 6.9 10*3/uL (ref 3.8–10.6)

## 2017-03-20 LAB — BASIC METABOLIC PANEL
Anion gap: 8 (ref 5–15)
BUN: 26 mg/dL — AB (ref 6–20)
CHLORIDE: 92 mmol/L — AB (ref 101–111)
CO2: 33 mmol/L — ABNORMAL HIGH (ref 22–32)
Calcium: 8.7 mg/dL — ABNORMAL LOW (ref 8.9–10.3)
Creatinine, Ser: 1.16 mg/dL (ref 0.61–1.24)
GFR calc Af Amer: >60 mL/min (ref >60)
GFR calc non Af Amer: 56 mL/min — ABNORMAL LOW (ref >60)
Glucose, Bld: 161 mg/dL — ABNORMAL HIGH (ref 65–99)
POTASSIUM: 4.6 mmol/L (ref 3.5–5.1)
Sodium: 133 mmol/L — ABNORMAL LOW (ref 135–145)

## 2017-03-20 LAB — GLUCOSE, CAPILLARY
GLUCOSE-CAPILLARY: 151 mg/dL — AB (ref 65–99)
GLUCOSE-CAPILLARY: 191 mg/dL — AB (ref 65–99)
GLUCOSE-CAPILLARY: 109 mg/dL — AB (ref 65–99)
Glucose-Capillary: 173 mg/dL — ABNORMAL HIGH (ref 65–99)

## 2017-03-20 LAB — MAGNESIUM: Magnesium: 1.8 mg/dL (ref 1.7–2.4)

## 2017-03-20 LAB — PHOSPHORUS: Phosphorus: 5.4 mg/dL — ABNORMAL HIGH (ref 2.5–4.6)

## 2017-03-20 MED ORDER — PHENOL 1.4 % MT LIQD
1.0000 | OROMUCOSAL | Status: DC | PRN
  Administered 2017-03-20 – 2017-03-23 (×4): 1 via OROMUCOSAL
  Filled 2017-03-20: qty 177

## 2017-03-20 MED ORDER — TAMSULOSIN HCL 0.4 MG PO CAPS
0.4000 mg | ORAL_CAPSULE | Freq: Every day | ORAL | Status: DC
  Administered 2017-03-20 – 2017-03-22 (×3): 0.4 mg via ORAL
  Filled 2017-03-20 (×3): qty 1

## 2017-03-20 MED ORDER — FUROSEMIDE 20 MG PO TABS
10.0000 mg | ORAL_TABLET | Freq: Every day | ORAL | Status: DC
  Administered 2017-03-20 – 2017-03-23 (×4): 10 mg via ORAL
  Filled 2017-03-20 (×5): qty 1

## 2017-03-20 MED ORDER — PANTOPRAZOLE SODIUM 40 MG PO TBEC
40.0000 mg | DELAYED_RELEASE_TABLET | Freq: Every day | ORAL | Status: DC
  Administered 2017-03-20 – 2017-03-23 (×4): 40 mg via ORAL
  Filled 2017-03-20 (×4): qty 1

## 2017-03-20 MED ORDER — LEVOTHYROXINE SODIUM 50 MCG PO TABS
50.0000 ug | ORAL_TABLET | Freq: Every day | ORAL | Status: DC
  Administered 2017-03-21 – 2017-03-23 (×3): 50 ug via ORAL
  Filled 2017-03-20 (×3): qty 1

## 2017-03-20 MED ORDER — UMECLIDINIUM-VILANTEROL 62.5-25 MCG/INH IN AEPB
1.0000 | INHALATION_SPRAY | Freq: Every day | RESPIRATORY_TRACT | Status: DC
  Administered 2017-03-20 – 2017-03-23 (×4): 1 via RESPIRATORY_TRACT
  Filled 2017-03-20: qty 14

## 2017-03-20 MED ORDER — IPRATROPIUM-ALBUTEROL 0.5-2.5 (3) MG/3ML IN SOLN
3.0000 mL | RESPIRATORY_TRACT | Status: DC
  Administered 2017-03-20 (×2): 3 mL via RESPIRATORY_TRACT
  Filled 2017-03-20 (×3): qty 3

## 2017-03-20 MED ORDER — FINASTERIDE 5 MG PO TABS
5.0000 mg | ORAL_TABLET | Freq: Every day | ORAL | Status: DC
  Administered 2017-03-20 – 2017-03-23 (×4): 5 mg via ORAL
  Filled 2017-03-20 (×4): qty 1

## 2017-03-20 MED ORDER — IPRATROPIUM-ALBUTEROL 0.5-2.5 (3) MG/3ML IN SOLN
3.0000 mL | Freq: Four times a day (QID) | RESPIRATORY_TRACT | Status: DC
  Administered 2017-03-21: 3 mL via RESPIRATORY_TRACT
  Filled 2017-03-20 (×2): qty 3

## 2017-03-20 MED ORDER — BUDESONIDE 0.5 MG/2ML IN SUSP
0.5000 mg | Freq: Two times a day (BID) | RESPIRATORY_TRACT | Status: DC
  Administered 2017-03-20 – 2017-03-23 (×6): 0.5 mg via RESPIRATORY_TRACT
  Filled 2017-03-20 (×6): qty 2

## 2017-03-20 NOTE — Progress Notes (Signed)
Report given to Threasa Beards, RN on 1A. Patient to be transferred shortly with telemetry. Family and patient updated on plan of care. Will continue to monitor. Wilnette Kales

## 2017-03-20 NOTE — Care Management (Signed)
RNCM has requested MD expedite [facility] authorization of Trilogy machine.

## 2017-03-20 NOTE — Progress Notes (Signed)
Patient transferred to 1A. Belongings with family. Aaron Hendrix

## 2017-03-20 NOTE — Progress Notes (Signed)
Current patient followed by in home palliative medicine who was admitted on 6.24.18 with acute on chronic hypoxic hypercarbic respiratory failure secondary to severe end stage COPD.  Patient was on Bi-Pap but is now on nasal cannula.  Team aware that the patient is currently followed by in home palliative medicine. Will continue to follow through final disposition.  Updated hospital information faxed to referral intake.

## 2017-03-20 NOTE — Care Management (Signed)
Patient recently discharged back to his home with his wife. They were going to find patient advanced directives. He is currently pending a Trilogy/NIV through his health insurance previously arranged through [place]. Patient has a previous referral to [facility] and is open to [facility] for home health services. Message sent to Williams with [facility] for update on Trilogy. Message also sent to Tim with [facility] and [facility] team. Patient has not qualified for home O2 however it seems his readmissions are more based on gas exchange problem of the lungs which the Trilogy may help.  RNCM will continue to follow.

## 2017-03-20 NOTE — Progress Notes (Signed)
Patient from CCU. Alert and oriented to person and place. Placed on tele 40-35 normal sinus at the time. No skin issues. Patient up to [facility] with 2 assist, slightly unsteady on feet. Oriented to room and call bell system.  Aaron Hendrix

## 2017-03-20 NOTE — Progress Notes (Signed)
Per Dr. Mortimer Fries, patient can tranfer to any med surg with telemetry monitoring, orders placed. Wilnette Kales

## 2017-03-20 NOTE — Final Consult Note (Signed)
[facility] Consultation    SYNOPSIS   Aaron Hendrix  is a 83 y.o. male with a known history of COPD who is on BiPAP at home however is noncompliant, DJD, diabetes, hypertension comes to the emergency room after he was found confused and altered mental status with unresponsiveness later on by wife at home.   ASSESSMENT/PLAN   Acute on chronic hypercapnic respiratory failure From COPD exacerbation Patient placed on BiPAP for respiratory distress  Patient seems to be stabilizing over the last 12 hours   placed on albuterol, Atrovent, Solu-Medrol and would empirically cover with antibiotics.  Patient has chronic hypercapnic respiratory failure and nocturnal noninvasive ventilation at night when he leaves the hospital is indicated and discuss with family about potential intubation/CPR if patient's status were to deteriorate  Metabolic alkalosis. Compensatory to chronic hypercapnic respiratory failure  Hyponatremia. 133 saline resuscitation and follow closely  Prerenal indices. Consistent with volume contraction  Mild anemia no active bleeding noted  Name: Aaron Hendrix MRN: [id number] DOB: [birth year]    ADMISSION DATE:  03/19/2017 CONSULTATION DATE:  03/19/2017  REFERRING MD :  Hospitalist service  CHIEF COMPLAINT:  Confusion/shortness of breath/hypercapnic respiratory failure   Synopsis Aaron Hendrix is an 83 year old gentleman with a past medical history remarkable for hypertension, hyperlipidemia, diabetes, gastric ulcer disease, degenerative joint disease, peripheral neuropathy secondary to diabetes, severe end-stage COPD with chronic hypercapnic respiratory failure. He was recently discharged however has been having difficulty obtaining his home BiPAP mask. His home regimen includes anoro, duonebs, CPAP, S/P steroids and antibiotics. He initially did well after his discharge however today developed progressive confusion, increasing shortness of  breath, labored breathing prompting visit to the emergency department in emergency department he was noted to have hypercapnic respiratory failure acute on chronic. He's also acutely confused with desaturation/hypoxemia. In talking with his family apparently when his CO2 levels increase about 60 he develops confusion and encephalopathy.   History of present illness Patient alert and awake following commands Shortness of breath seems to be improved Off BiPAP now On nasal cannula oxygen [facility] status seems to be stable   Medications reviewed  REVIEW OF SYSTEMS:    Review of Systems:  Gen:  Denies  fever, sweats, chills weigh loss   HEENT: Denies blurred vision, double vision, ear pain, eye pain, hearing loss, nose bleeds, sore throat  Cardiac:  No dizziness, chest pain or heaviness, chest tightness,edema  Resp:   +shortness of breath,-wheezing, -hemoptysis,   Other:  All other systems negative Alert, awake   VITAL SIGNS: Patient blood pressure is 110/70, his respiratory rate is in the mid 20s, he is on noninvasive ventilation with oxygen saturation varying anywhere from 80-90%. His rhythm is normal sinus with a pulse in the 60s  FiO2 (%):  [28 %] 28 % INTAKE / OUTPUT:  Intake/Output Summary (Last 24 hours) at 03/20/17 0806 Last data filed at 03/20/17 0500  Gross per 24 hour  Intake                0 ml  Output              865 ml  Net             -865 ml    Physical Examination:   VS: BP 129/71   Pulse 81   Temp 97.6 F (36.4 C) (Axillary)   Resp 13   Ht 5\' 7"  (1.702 m)   Wt 144 lb 10 oz (65.6 kg)  SpO2 100%   BMI 22.65 kg/m   General Appearance: confused elderly chronically ill-appearing gentleman Neuro: No focal deficits  HEENT: Trachea is midline, no thyromegaly noted, presently wearing noninvasive ventilation  Pulmonary: Prolonged expiratory phase, mild wheezing Cardiovascular distant heart sounds, S1/S2 without clear auscultated murmur Abdomen:  Benign, Soft, non-tender, No masses, hepatosplenomegaly, No lymphadenopathy Extremities: normal, no cyanosis, clubbing, no edema, warm with normal capillary refill.    LABS: Reviewed   LABORATORY PANEL:   CBC  Recent Labs Lab 03/20/17 0422  WBC 6.9  HGB 13.3  HCT 40.7  PLT 290    Chemistries   Recent Labs Lab 03/19/17 1238 03/20/17 0422  NA 133* 133*  K 4.2 4.6  CL 91* 92*  CO2 37* 33*  GLUCOSE 67 161*  BUN 22* 26*  CREATININE 1.14 1.16  CALCIUM 8.9 8.7*  MG  --  1.8  PHOS  --  5.4*  AST 17  --   ALT 11*  --   ALKPHOS 51  --   BILITOT 0.4  --      Recent Labs Lab 03/19/17 1553 03/19/17 2104 03/20/17 0754  GLUCAP 99 117* 151*   No results for input(s): PHART, PCO2ART, PO2ART in the last 168 hours.  Recent Labs Lab 03/19/17 1238  AST 17  ALT 11*  ALKPHOS 51  BILITOT 0.4  ALBUMIN 3.6    Cardiac Enzymes  Recent Labs Lab 03/19/17 1238  TROPONINI <0.03    Ok to transfer to gen med floor in next 12-24 hrs   Aaron Hendrix Aaron Hendrix, M.D.  [facility]  Medical Director [place] Director [facility] Cardio-Pulmonary Department

## 2017-03-20 NOTE — Progress Notes (Signed)
[name], male   DOB: 05/08/1930, 83 y.o.   MRN: [id number]  [facility] PROGRESS NOTE  Aaron Hendrix [id number]:[id number] DOB: 1930/06/15 DOA: 03/19/2017 PCP: [name], MD  HPI/Subjective: Patient brought in with SOB, confusion and restlessness  Objective: Vitals:   03/20/17 1500 03/20/17 1554  BP: 112/61 (!) 104/56  Pulse: 86 87  Resp: (!) 33 17  Temp:  97.5 F (36.4 C)    Filed Weights   03/19/17 1225 03/19/17 1555  Weight: 62.6 kg (138 lb) 65.6 kg (144 lb 10 oz)    ROS: Review of Systems  Constitutional: Positive for malaise/fatigue. Negative for chills and fever.  Eyes: Negative for blurred vision.  Respiratory: Positive for shortness of breath. Negative for cough.   Cardiovascular: Negative for chest pain.  Gastrointestinal: Negative for abdominal pain, constipation, diarrhea, nausea and vomiting.  Genitourinary: Negative for dysuria.  Musculoskeletal: Negative for joint pain.  Neurological: Positive for weakness. Negative for dizziness and headaches.   Exam: Physical Exam  HENT:  Nose: No mucosal edema.  Mouth/Throat: No oropharyngeal exudate or posterior oropharyngeal edema.  Eyes: Conjunctivae, EOM and lids are normal. Pupils are equal, round, and reactive to light.  Neck: No JVD present. Carotid bruit is not present. No edema present. No thyroid mass and no thyromegaly present.  Cardiovascular: S1 normal and S2 normal.  Exam reveals no gallop.   No murmur heard. Pulses:      Dorsalis pedis pulses are 2+ on the right side, and 2+ on the left side.  Respiratory: No respiratory distress. He has decreased breath sounds in the right middle field, the right lower field, the left middle field and the left lower field. He has no wheezes. He has no rhonchi. He has no rales.  GI: Soft. Bowel sounds are normal. There is no tenderness.  Musculoskeletal:       Right shoulder: He exhibits no swelling.  Lymphadenopathy:    He has no cervical  adenopathy.  Neurological: He is alert. No cranial nerve deficit.  Skin: Skin is warm. No rash noted. Nails show no clubbing.  Psychiatric: He has a normal mood and affect.      Data Reviewed: Basic Metabolic Panel:  Recent Labs Lab 03/19/17 1238 03/20/17 0422  NA 133* 133*  K 4.2 4.6  CL 91* 92*  CO2 37* 33*  GLUCOSE 67 161*  BUN 22* 26*  CREATININE 1.14 1.16  CALCIUM 8.9 8.7*  MG  --  1.8  PHOS  --  5.4*   Liver Function Tests:  Recent Labs Lab 03/19/17 1238  AST 17  ALT 11*  ALKPHOS 51  BILITOT 0.4  PROT 6.5  ALBUMIN 3.6   CBC:  Recent Labs Lab 03/19/17 1238 03/20/17 0422  WBC 9.5 6.9  NEUTROABS 6.9*  --   HGB 12.1* 13.3  HCT 36.8* 40.7  MCV 92.5 94.4  PLT 281 290   Cardiac Enzymes:  Recent Labs Lab 03/19/17 1238  TROPONINI <0.03   BNP (last 3 results)  Recent Labs  11/25/16 1425  BNP 83.0     CBG:  Recent Labs Lab 03/19/17 1553 03/19/17 2104 03/20/17 0754 03/20/17 1104 03/20/17 1631  GLUCAP 99 117* 151* 191* 109*    No results found for this or any previous visit (from the past 240 hour(s)).   Studies: Dg Chest Portable 1 View  Result Date: 03/19/2017 CLINICAL DATA:  Difficulty breathing since this morning. EXAM: PORTABLE CHEST 1 VIEW COMPARISON:  03/08/2017 and 11/25/2016 FINDINGS:  Lungs are adequately inflated without focal consolidation or effusion. Cardiomediastinal silhouette is within normal. Air-filled small bowel loops immediately below the right hemidiaphragm unchanged. Mild degenerate change of the spine. IMPRESSION: No acute cardiopulmonary disease. Electronically Signed   By: Marin Olp M.D.   On: 03/19/2017 13:44    Scheduled Meds: . budesonide (PULMICORT) nebulizer solution  0.5 mg Nebulization BID  . chlorhexidine  15 mL Mouth Rinse BID  . enoxaparin (LOVENOX) injection  40 mg Subcutaneous Q24H  . finasteride  5 mg Oral Daily  . furosemide  10 mg Oral Daily  . insulin aspart  0-9 Units Subcutaneous TID  WC  . ipratropium-albuterol  3 mL Nebulization Q4H  . [START ON 03/21/2017] levothyroxine  50 mcg Oral QAC breakfast  . mouth rinse  15 mL Mouth Rinse q12n4p  . methylPREDNISolone (SOLU-MEDROL) injection  60 mg Intravenous Q24H  . pantoprazole  40 mg Oral Daily  . tamsulosin  0.4 mg Oral QPC supper  . umeclidinium-vilanterol  1 puff Inhalation Daily   Continuous Infusions:   Assessment/Plan: Aaron Hendrix  is a 83 y.o. male with a known history of End-stage COPD with history of chronic hypercarbia on chronic home oxygen and CPAP at nighttime comes to the emergency room with increasing confusion and shortness of breath. Patient is very restless and fidgety.  1. Acute on chronic hypoxic hypercarbic respiratory failure. Patient will be a candidate for noninvasive ventilation/Trilogy at home at night to try to prevent hospitalizations. 2. COPD exacerbation. Continue Solu-Medrol and nebulizer treatments 3. Type 2 diabetes mellitus on sliding scale 4. Memory loss 5. BPH on Flomax and finasteride 6. Hypothyroidism unspecified on levothyroxin 7. GERD on Protonix    Code Status: FULL CODE Family Communication: Patient's wife and son at the bedside Disposition Plan: they would like to make sure he's trilogy in hand before D/C. Patient potentially could go home Friday or earlier if insurance can approve and provide trilogy to avoid readmissions  Consultants:  Critical care specialist  Time spent: 25 minutes  [place]

## 2017-03-20 NOTE — Care Management (Addendum)
RNCM received call from patient's daughter [name] "frustrated that her father had to come back to ICU because he didn't get his Trilogy at discharge".  I advised [name] to contact [facility] with patient's permission to attempt to expedite on patient's side. I explained that I had requested physician's to contact [facility] also however the hold-time to speak with a representative may hinder physician's ability to meet that need. She states that "he will not leave the hospital until he gets the Trilogy". RNCM will continue to work with [facility] on authorization of Trilogy. Patient does not have a BIPAP at home- he has a CPAP which is not meeting his pulmonary needs as established on resent admission:  chronic hypercapnic respiratory failure ICD 10 J96.12. Authorization started last admission 03/10/2017 through [facility] by this RNCM. Per record [employer] auth was started on 6/24/20189 (not correct).  Prior [facility] Auth number- [id number] was transferred three times during my call. Finally, I was able to speak with Beth at [facility] an "[place] agent" which is the only one that can handle this case . Ref # [id number]. Claim is with medical director for review currently-  "they have to give a determination by Friday (03/24/17) per Beth. Eustaquio Maize has expedited authorization to medical director in hopes to get determination to [facility] before Friday. Corene Cornea with [facility] updated.

## 2017-03-20 NOTE — Progress Notes (Signed)
Patient had run of SVT (12 beats, rate only increased to 111 bmp). Patient asymptomatic. Dr. Mortimer Fries notified, no new orders currently. Will continue to monitor.  Wilnette Kales

## 2017-03-21 DIAGNOSIS — J9612 Chronic respiratory failure with hypercapnia: Secondary | ICD-10-CM

## 2017-03-21 DIAGNOSIS — J441 Chronic obstructive pulmonary disease with (acute) exacerbation: Principal | ICD-10-CM

## 2017-03-21 DIAGNOSIS — J9611 Chronic respiratory failure with hypoxia: Secondary | ICD-10-CM

## 2017-03-21 LAB — CBC
HCT: 39.4 % — ABNORMAL LOW (ref 40.0–52.0)
Hemoglobin: 13.2 g/dL (ref 13.0–18.0)
MCH: 31.7 pg (ref 26.0–34.0)
MCHC: 33.5 g/dL (ref 32.0–36.0)
MCV: 94.5 fL (ref 80.0–100.0)
PLATELETS: 282 10*3/uL (ref 150–440)
RBC: 4.17 MIL/uL — AB (ref 4.40–5.90)
RDW: 13.9 % (ref 11.5–14.5)
WBC: 6.2 10*3/uL (ref 3.8–10.6)

## 2017-03-21 LAB — BASIC METABOLIC PANEL
Anion gap: 6 (ref 5–15)
BUN: 32 mg/dL — AB (ref 6–20)
CO2: 31 mmol/L (ref 22–32)
Calcium: 8.5 mg/dL — ABNORMAL LOW (ref 8.9–10.3)
Chloride: 94 mmol/L — ABNORMAL LOW (ref 101–111)
Creatinine, Ser: 1.24 mg/dL (ref 0.61–1.24)
GFR calc Af Amer: 60 mL/min — ABNORMAL LOW (ref >60)
GFR, EST NON AFRICAN AMERICAN: 52 mL/min — AB (ref >60)
GLUCOSE: 209 mg/dL — AB (ref 65–99)
POTASSIUM: 4.2 mmol/L (ref 3.5–5.1)
Sodium: 131 mmol/L — ABNORMAL LOW (ref 135–145)

## 2017-03-21 LAB — GLUCOSE, CAPILLARY
GLUCOSE-CAPILLARY: 178 mg/dL — AB (ref 65–99)
Glucose-Capillary: 177 mg/dL — ABNORMAL HIGH (ref 65–99)
Glucose-Capillary: 146 mg/dL — ABNORMAL HIGH (ref 65–99)
Glucose-Capillary: 83 mg/dL (ref 65–99)

## 2017-03-21 LAB — PROCALCITONIN: Procalcitonin: <0.1 ng/mL

## 2017-03-21 MED ORDER — ALBUTEROL SULFATE (2.5 MG/3ML) 0.083% IN NEBU
2.5000 mg | INHALATION_SOLUTION | RESPIRATORY_TRACT | Status: DC
  Administered 2017-03-21: 2.5 mg via RESPIRATORY_TRACT
  Filled 2017-03-21: qty 3

## 2017-03-21 MED ORDER — IPRATROPIUM-ALBUTEROL 0.5-2.5 (3) MG/3ML IN SOLN
3.0000 mL | RESPIRATORY_TRACT | Status: DC
  Administered 2017-03-21 – 2017-03-22 (×8): 3 mL via RESPIRATORY_TRACT
  Filled 2017-03-21 (×7): qty 3

## 2017-03-21 MED ORDER — METHYLPREDNISOLONE SODIUM SUCC 40 MG IJ SOLR
40.0000 mg | INTRAMUSCULAR | Status: DC
  Administered 2017-03-21 – 2017-03-22 (×2): 40 mg via INTRAVENOUS
  Filled 2017-03-21 (×2): qty 1

## 2017-03-21 NOTE — Progress Notes (Signed)
Patient alert and oriented. Patient on room air, Bipap at night. Complaining of mild sore throat, relieved by Chloraseptic spray. Reoriented to call bell system.   Deri Fuelling, RN

## 2017-03-21 NOTE — Progress Notes (Signed)
[name], male   DOB: [birth year], 83 y.o.   MRN: [id number]  [facility] PROGRESS NOTE  Aaron Hendrix [id number]:[id number] DOB: 1930/05/22 DOA: 03/19/2017 PCP: [name], MD  HPI/Subjective: Patient brought in with SOB, confusion and restlessness, slowly improving.  Objective: Vitals:   03/20/17 2304 03/21/17 0756  BP: 138/64 122/72  Pulse: 91 85  Resp:  16  Temp: 97.5 F (36.4 C) 97.5 F (36.4 C)    Filed Weights   03/19/17 1225 03/19/17 1555  Weight: 62.6 kg (138 lb) 65.6 kg (144 lb 10 oz)    ROS: Review of Systems  Constitutional: Positive for malaise/fatigue. Negative for chills and fever.  Eyes: Negative for blurred vision.  Respiratory: Positive for shortness of breath. Negative for cough.   Cardiovascular: Negative for chest pain.  Gastrointestinal: Negative for abdominal pain, constipation, diarrhea, nausea and vomiting.  Genitourinary: Negative for dysuria.  Musculoskeletal: Negative for joint pain.  Neurological: Positive for weakness. Negative for dizziness and headaches.   Exam: Physical Exam  HENT:  Nose: No mucosal edema.  Mouth/Throat: No oropharyngeal exudate or posterior oropharyngeal edema.  Eyes: Conjunctivae, EOM and lids are normal. Pupils are equal, round, and reactive to light.  Neck: No JVD present. Carotid bruit is not present. No edema present. No thyroid mass and no thyromegaly present.  Cardiovascular: S1 normal and S2 normal.  Exam reveals no gallop.   No murmur heard. Pulses:      Dorsalis pedis pulses are 2+ on the right side, and 2+ on the left side.  Respiratory: No respiratory distress. He has decreased breath sounds in the right middle field, the right lower field, the left middle field and the left lower field. He has no wheezes. He has no rhonchi. He has no rales.  GI: Soft. Bowel sounds are normal. There is no tenderness.  Musculoskeletal:       Right shoulder: He exhibits no swelling.   Lymphadenopathy:    He has no cervical adenopathy.  Neurological: He is alert. No cranial nerve deficit.  Skin: Skin is warm. No rash noted. Nails show no clubbing.  Psychiatric: He has a normal mood and affect.      Data Reviewed: Basic Metabolic Panel:  Recent Labs Lab 03/19/17 1238 03/20/17 0422 03/21/17 0606  NA 133* 133* 131*  K 4.2 4.6 4.2  CL 91* 92* 94*  CO2 37* 33* 31  GLUCOSE 67 161* 209*  BUN 22* 26* 32*  CREATININE 1.14 1.16 1.24  CALCIUM 8.9 8.7* 8.5*  MG  --  1.8  --   PHOS  --  5.4*  --    Liver Function Tests:  Recent Labs Lab 03/19/17 1238  AST 17  ALT 11*  ALKPHOS 51  BILITOT 0.4  PROT 6.5  ALBUMIN 3.6   CBC:  Recent Labs Lab 03/19/17 1238 03/20/17 0422 03/21/17 0606  WBC 9.5 6.9 6.2  NEUTROABS 6.9*  --   --   HGB 12.1* 13.3 13.2  HCT 36.8* 40.7 39.4*  MCV 92.5 94.4 94.5  PLT 281 290 282   Cardiac Enzymes:  Recent Labs Lab 03/19/17 1238  TROPONINI <0.03   BNP (last 3 results)  Recent Labs  11/25/16 1425  BNP 83.0     CBG:  Recent Labs Lab 03/20/17 1631 03/20/17 2104 03/21/17 0732 03/21/17 1144 03/21/17 1624  GLUCAP 109* 173* 177* 146* 83    No results found for this or any previous visit (from the past 240 hour(s)).  Studies: No results found.  Scheduled Meds: . budesonide (PULMICORT) nebulizer solution  0.5 mg Nebulization BID  . chlorhexidine  15 mL Mouth Rinse BID  . enoxaparin (LOVENOX) injection  40 mg Subcutaneous Q24H  . finasteride  5 mg Oral Daily  . furosemide  10 mg Oral Daily  . insulin aspart  0-9 Units Subcutaneous TID WC  . ipratropium-albuterol  3 mL Nebulization Q4H  . levothyroxine  50 mcg Oral QAC breakfast  . mouth rinse  15 mL Mouth Rinse q12n4p  . methylPREDNISolone (SOLU-MEDROL) injection  40 mg Intravenous Q24H  . pantoprazole  40 mg Oral Daily  . tamsulosin  0.4 mg Oral QPC supper  . umeclidinium-vilanterol  1 puff Inhalation Daily   Continuous  Infusions:   Assessment/Plan: Aaron Hendrix  is a 83 y.o. male with a known history of End-stage COPD with history of chronic hypercarbia on chronic home oxygen and CPAP at nighttime comes to the emergency room with increasing confusion and shortness of breath. Patient is very restless and fidgety.  1. Acute on chronic hypoxic hypercarbic respiratory failure: still waiting to get Trilogy approval from insurance for home at night to try to prevent hospitalizations. 2. COPD exacerbation. Continue Solu-Medrol and nebulizer treatments 3. Type 2 diabetes mellitus on sliding scale 4. Memory loss 5. BPH on Flomax and finasteride 6. Hypothyroidism unspecified on levothyroxin 7. GERD on Protonix    Code Status: FULL CODE Family Communication: Patient's wife and son at the bedside Disposition Plan: they would like to make sure he's trilogy in hand before D/C. Patient potentially could go home Friday or earlier if insurance can approve and provide trilogy to avoid readmissions  Consultants:  Critical care specialist  Time spent: 15 minutes  Ezekiel Menzer [employer]

## 2017-03-21 NOTE — Consult Note (Signed)
[facility] Consultation    SYNOPSIS   Curt Oatis  is a 83 y.o. male with a known history of COPD DJD, diabetes, hypertension comes to the emergency room after he was found confused and altered mental status with unresponsiveness later on by wife at home. Transferred out of ICU, resp status doing better, COPD exacerbation  ASSESSMENT/PLAN   Acute on chronic hypercapnic respiratory failure From COPD exacerbation-slowly resolving  Patient seems to be stabilizing over the last 12 hours   Patient has chronic hypercapnic respiratory failure and will need nocturnal noninvasive ventilation at night when he leaves the hospital is indicated  He will need Trilogy to prevent recurrent hospitalizations and to prevent COPD exacerbations   1.continue steroids wean as tolerated 2.continue BD therapy every 4 hrs 3.continue oxygen as needed 4.TRilogy noninvasive needed at night    Name: OLNEY MONIER MRN: [id number] DOB: 04/05/1930    ADMISSION DATE:  03/19/2017 CONSULTATION DATE:  03/19/2017  REFERRING MD :  Hospitalist service  CHIEF COMPLAINT:  Confusion/shortness of breath/hypercapnic respiratory failure   Synopsis Mr. Sudduth is an 83 year old gentleman with a past medical history remarkable for hypertension, hyperlipidemia, diabetes, gastric ulcer disease, degenerative joint disease, peripheral neuropathy secondary to diabetes, severe end-stage COPD with chronic hypercapnic respiratory failure. He was recently discharged however has been having difficulty obtaining his home BiPAP mask. His home regimen includes anoro, duonebs, CPAP, S/P steroids and antibiotics. He initially did well after his discharge however today developed progressive confusion, increasing shortness of breath, labored breathing prompting visit to the emergency department in emergency department he was noted to have hypercapnic respiratory failure acute on chronic. He's also acutely  confused with desaturation/hypoxemia. In talking with his family apparently when his CO2 levels increase about 60 he develops confusion and encephalopathy.   History of present illness Patient alert and awake following commands Shortness of breath seems to be improved Off BiPAP now On nasal cannula oxygen [employer] status seems to be stable   Medications reviewed   Review of Systems:  Gen:  Denies  fever, sweats, chills weigh loss   HEENT: Denies blurred vision, double vision, ear pain, eye pain, hearing loss, nose bleeds, sore throat  Cardiac:  No dizziness, chest pain or heaviness, chest tightness,edema  Resp:   +shortness of breath,-wheezing, -hemoptysis,   Other:  All other systems negative Alert, awake   BP 122/72 (BP Location: Left Arm)   Pulse 85   Temp 97.5 F (36.4 C) (Oral)   Resp 16   Ht 5\' 7"  (1.702 m)   Wt 144 lb 10 oz (65.6 kg)   SpO2 98%   BMI 22.65 kg/m   FiO2 (%):  [28 %] 28 % INTAKE / OUTPUT:  Intake/Output Summary (Last 24 hours) at 03/21/17 0910 Last data filed at 03/20/17 1800  Gross per 24 hour  Intake              720 ml  Output                0 ml  Net              720 ml    Physical Examination:   VS: BP 122/72 (BP Location: Left Arm)   Pulse 85   Temp 97.5 F (36.4 C) (Oral)   Resp 16   Ht 5\' 7"  (1.702 m)   Wt 144 lb 10 oz (65.6 kg)   SpO2 98%   BMI 22.65 kg/m   General Appearance  thin and cachectic Neuro: No focal deficits  HEENT: Trachea is midline, no thyromegaly noted, presently wearing noninvasive ventilation  Pulmonary: Prolonged expiratory phase, mild wheezing Cardiovascular distant heart sounds, S1/S2 without clear auscultated murmur Abdomen: Benign, Soft, non-tender, No masses, hepatosplenomegaly, No lymphadenopathy Extremities: normal, no cyanosis, clubbing, no edema, warm with normal capillary refill.    LABS: Reviewed   LABORATORY PANEL:   CBC  Recent Labs Lab 03/21/17 0606  WBC 6.2  HGB 13.2    HCT 39.4*  PLT 282    Chemistries   Recent Labs Lab 03/19/17 1238 03/20/17 0422 03/21/17 0606  NA 133* 133* 131*  K 4.2 4.6 4.2  CL 91* 92* 94*  CO2 37* 33* 31  GLUCOSE 67 161* 209*  BUN 22* 26* 32*  CREATININE 1.14 1.16 1.24  CALCIUM 8.9 8.7* 8.5*  MG  --  1.8  --   PHOS  --  5.4*  --   AST 17  --   --   ALT 11*  --   --   ALKPHOS 51  --   --   BILITOT 0.4  --   --      Recent Labs Lab 03/19/17 2104 03/20/17 0754 03/20/17 1104 03/20/17 1631 03/20/17 2104 03/21/17 0732  GLUCAP 117* 151* 191* 109* 173* 177*   No results for input(s): PHART, PCO2ART, PO2ART in the last 168 hours.  Recent Labs Lab 03/19/17 1238  AST 17  ALT 11*  ALKPHOS 51  BILITOT 0.4  ALBUMIN 3.6      Corrin Parker, M.D.  [facility]  Medical Director [place] Director [facility] Cardio-Pulmonary Department

## 2017-03-22 ENCOUNTER — Telehealth: Payer: Self-pay | Admitting: *Deleted

## 2017-03-22 LAB — GLUCOSE, CAPILLARY
GLUCOSE-CAPILLARY: 134 mg/dL — AB (ref 65–99)
GLUCOSE-CAPILLARY: 124 mg/dL — AB (ref 65–99)
GLUCOSE-CAPILLARY: 80 mg/dL (ref 65–99)
GLUCOSE-CAPILLARY: 184 mg/dL — AB (ref 65–99)

## 2017-03-22 LAB — PROCALCITONIN

## 2017-03-22 MED ORDER — PHENOL 1.4 % MT LIQD: 1.0000 | OROMUCOSAL | 0 refills | Status: DC | PRN

## 2017-03-22 MED ORDER — CHLORHEXIDINE GLUCONATE 0.12 % MT SOLN: 15.0000 mL | Freq: Two times a day (BID) | OROMUCOSAL | 0 refills | Status: DC

## 2017-03-22 MED ORDER — METOPROLOL TARTRATE 25 MG PO TABS
25.0000 mg | ORAL_TABLET | Freq: Two times a day (BID) | ORAL | Status: DC
  Administered 2017-03-22 – 2017-03-23 (×3): 25 mg via ORAL
  Filled 2017-03-22 (×3): qty 1

## 2017-03-22 MED ORDER — METOPROLOL TARTRATE 25 MG PO TABS: 25.0000 mg | ORAL_TABLET | Freq: Two times a day (BID) | ORAL | 0 refills | Status: DC

## 2017-03-22 MED ORDER — PREDNISONE 10 MG (21) PO TBPK: ORAL_TABLET | ORAL | 0 refills | Status: DC

## 2017-03-22 NOTE — Progress Notes (Signed)
[place] at [place] was admitted to the Hospital on 03/19/2017 and Discharged  03/22/2017 and his son is his primary caregiver, who was in the hospital throughout the stay.  Please excuse him from for about dates.  Max Sane M.D on 03/22/2017,at 8:09 AM  [place] at [facility]  [phone]

## 2017-03-22 NOTE — Care Management Note (Signed)
Case Management Note  Patient Details  Name: Aaron Hendrix MRN: [id number] Date of Birth: Feb 04, 1930  Subjective/Objective:   Case discussed with attending. Discharging today with resumption of home health with [facility]. MD ordered, RN, PT, OT and HHA. Met with son Rex at bedside. Discussed POC and discharge planning. Wife on speaker phone during discussion. All are in agreement with POC. Son refused HHA. Patient uses a cane at home. He is able to provide his own adls per the son. Palliative to resume [place] with [facility] made aware of discharge. Still no approval for triology, son aware.                   Action/Plan: [facility] for RN, PT, OT and HHA (Son refused HHA). No DME needs. Case Closed.     Expected Discharge Date:  03/22/17               Expected Discharge Plan:  [place]  In-House Referral:     Discharge planning Services  CM Consult  Post Acute Care Choice:  Home Health Choice offered to:  Adult Children  DME Arranged:    DME Agency:     HH Arranged:  RN, PT, OT, Nurse's Aide [place] Agency:  [facility]), [facility]  Status of Service:  Completed, signed off  If discussed at [employer], dates discussed:    Additional Comments:  Jolly Mango, RN 03/22/2017, 8:19 AM

## 2017-03-22 NOTE — Progress Notes (Signed)
[name], male   DOB: 1930-06-15, 83 y.o.   MRN: [id number]  [facility] PROGRESS NOTE  Aaron Hendrix:[id number] DOB: 10/07/1929 DOA: 03/19/2017 PCP: [name], MD  HPI/Subjective: Improving.  Son at bedside,   Objective: Vitals:   03/22/17 0811 03/22/17 1552  BP: (!) 143/71 (!) 127/55  Pulse: 74 70  Resp: 18 18  Temp: 97.5 F (36.4 C)     Filed Weights   03/19/17 1225 03/19/17 1555  Weight: 62.6 kg (138 lb) 65.6 kg (144 lb 10 oz)    ROS: Review of Systems  Constitutional: Positive for malaise/fatigue. Negative for chills and fever.  Eyes: Negative for blurred vision.  Respiratory: Positive for shortness of breath. Negative for cough.   Cardiovascular: Negative for chest pain.  Gastrointestinal: Negative for abdominal pain, constipation, diarrhea, nausea and vomiting.  Genitourinary: Negative for dysuria.  Musculoskeletal: Negative for joint pain.  Neurological: Positive for weakness. Negative for dizziness and headaches.   Exam: Physical Exam  HENT:  Nose: No mucosal edema.  Mouth/Throat: No oropharyngeal exudate or posterior oropharyngeal edema.  Eyes: Conjunctivae, EOM and lids are normal. Pupils are equal, round, and reactive to light.  Neck: No JVD present. Carotid bruit is not present. No edema present. No thyroid mass and no thyromegaly present.  Cardiovascular: S1 normal and S2 normal.  Exam reveals no gallop.   No murmur heard. Pulses:      Dorsalis pedis pulses are 2+ on the right side, and 2+ on the left side.  Respiratory: No respiratory distress. He has decreased breath sounds in the right middle field, the right lower field, the left middle field and the left lower field. He has no wheezes. He has no rhonchi. He has no rales.  GI: Soft. Bowel sounds are normal. There is no tenderness.  Musculoskeletal:       Right shoulder: He exhibits no swelling.  Lymphadenopathy:    He has no cervical adenopathy.   Neurological: He is alert. No cranial nerve deficit.  Skin: Skin is warm. No rash noted. Nails show no clubbing.  Psychiatric: He has a normal mood and affect.      Data Reviewed: Basic Metabolic Panel:  Recent Labs Lab 03/19/17 1238 03/20/17 0422 03/21/17 0606  NA 133* 133* 131*  K 4.2 4.6 4.2  CL 91* 92* 94*  CO2 37* 33* 31  GLUCOSE 67 161* 209*  BUN 22* 26* 32*  CREATININE 1.14 1.16 1.24  CALCIUM 8.9 8.7* 8.5*  MG  --  1.8  --   PHOS  --  5.4*  --    Liver Function Tests:  Recent Labs Lab 03/19/17 1238  AST 17  ALT 11*  ALKPHOS 51  BILITOT 0.4  PROT 6.5  ALBUMIN 3.6   CBC:  Recent Labs Lab 03/19/17 1238 03/20/17 0422 03/21/17 0606  WBC 9.5 6.9 6.2  NEUTROABS 6.9*  --   --   HGB 12.1* 13.3 13.2  HCT 36.8* 40.7 39.4*  MCV 92.5 94.4 94.5  PLT 281 290 282   Cardiac Enzymes:  Recent Labs Lab 03/19/17 1238  TROPONINI <0.03   BNP (last 3 results)  Recent Labs  11/25/16 1425  BNP 83.0     CBG:  Recent Labs Lab 03/21/17 1624 03/21/17 2109 03/22/17 0743 03/22/17 1150 03/22/17 1537  GLUCAP 83 178* 134* 124* 80    No results found for this or any previous visit (from the past 240 hour(s)).   Studies: No results found.  Scheduled Meds: . budesonide (PULMICORT) nebulizer solution  0.5 mg Nebulization BID  . chlorhexidine  15 mL Mouth Rinse BID  . enoxaparin (LOVENOX) injection  40 mg Subcutaneous Q24H  . finasteride  5 mg Oral Daily  . furosemide  10 mg Oral Daily  . insulin aspart  0-9 Units Subcutaneous TID WC  . ipratropium-albuterol  3 mL Nebulization Q4H  . levothyroxine  50 mcg Oral QAC breakfast  . mouth rinse  15 mL Mouth Rinse q12n4p  . methylPREDNISolone (SOLU-MEDROL) injection  40 mg Intravenous Q24H  . metoprolol tartrate  25 mg Oral BID  . pantoprazole  40 mg Oral Daily  . tamsulosin  0.4 mg Oral QPC supper  . umeclidinium-vilanterol  1 puff Inhalation Daily   Continuous Infusions:   Assessment/Plan: Rutherford Alarie  is a 83 y.o. male with a known history of End-stage COPD with history of chronic hypercarbia on chronic home oxygen and CPAP at nighttime comes to the emergency room with increasing confusion and shortness of breath. Patient is very restless and fidgety.  1. Acute on chronic hypoxic hypercarbic respiratory failure: His insurance did not approve Trilogy.  Per discussion with care management.  They approve BiPAP with AVAP May take another 24 hours before he will have it at bedside.  We will hold off on discharging him as he is likely to come back without this 2. COPD exacerbation. Continue Solu-Medrol and nebulizer treatments 3. Type 2 diabetes mellitus on sliding scale 4. Memory loss 5. BPH on Flomax and finasteride 6. Hypothyroidism unspecified on levothyroxin 7. GERD on Protonix    Code Status: FULL CODE Family Communication: Patient's son at the bedside Disposition Plan: His insurance did not approve Trilogy.  Per discussion with care management.  They approve BiPAP with AVAP which may take another 24 hours before he will have it at bedside.  We will hold off on discharging him as he is likely to come back without this.  Very high risk for readmission   Consultants:  Critical care specialist  Time spent: 15 minutes  [place]

## 2017-03-22 NOTE — Care Management (Addendum)
Received notification from CM team lead, family is appealing discharge.

## 2017-03-22 NOTE — Care Management Important Message (Signed)
Important Message  Patient Details  Name: BRANSYN ADAMI MRN: [id number] Date of Birth: [birth year]   Medicare Important Message Given:  Yes    Jolly Mango, RN 03/22/2017, 8:26 AM

## 2017-03-22 NOTE — Care Management (Signed)
Detailed notice of discharge signed by spouse.

## 2017-03-22 NOTE — Care Management (Addendum)
TC received from [place] at [facility] [phone]). [employer] has denied the trilogy. They will approve a bipap w/ AVAP. TC to Dr. Manuella Ghazi who is agreeable to write order for BiPAP with AVAP. Corene Cornea with [facility] notified and spoke with family (wife and sister in law). Family in agreement to bipap. Bipap can be delivered tomorrow.   Appeal in process.    Faxed order for BiPAP  to [employer] Izora Gala) [phone].

## 2017-03-22 NOTE — Telephone Encounter (Signed)
Patient will discharge from [facility] on 03/22/17. Pt will need a HFU scheduled with Dr. Nicki Reaper Pt contact [phone]

## 2017-03-22 NOTE — Discharge Instructions (Signed)

## 2017-03-22 NOTE — Care Management (Signed)
Spoke with Alecia at [facility] who is wanting clarification as to if patient is on CPAP or BIPAP. Spoke with wife. She states patient is on CPAP at home.

## 2017-03-23 LAB — GLUCOSE, CAPILLARY
Glucose-Capillary: 122 mg/dL — ABNORMAL HIGH (ref 65–99)
Glucose-Capillary: 118 mg/dL — ABNORMAL HIGH (ref 65–99)

## 2017-03-23 MED ORDER — IPRATROPIUM-ALBUTEROL 0.5-2.5 (3) MG/3ML IN SOLN: 3.0000 mL | RESPIRATORY_TRACT | Status: DC | PRN

## 2017-03-23 MED ORDER — IPRATROPIUM-ALBUTEROL 0.5-2.5 (3) MG/3ML IN SOLN
3.0000 mL | Freq: Four times a day (QID) | RESPIRATORY_TRACT | Status: DC
  Administered 2017-03-23: 3 mL via RESPIRATORY_TRACT
  Filled 2017-03-23: qty 3

## 2017-03-23 NOTE — Telephone Encounter (Signed)
Offered patient appointment on 04/03/17 could not take that time due to being on vacation, needs appointment date and time.

## 2017-03-23 NOTE — Discharge Summary (Addendum)
[place] at [place] NAME: Aaron Hendrix    MR#:  [id number]  DATE OF BIRTH:  [birth year]  DATE OF ADMISSION:  03/19/2017   ADMITTING PHYSICIAN: Fritzi Mandes, MD  DATE OF DISCHARGE: 03/23/2017  2:38 PM  PRIMARY CARE PHYSICIAN: Aaron Pheasant, MD   ADMISSION DIAGNOSIS:  CO2 narcosis [R06.89] DISCHARGE DIAGNOSIS:  Active Problems:   Respiratory failure with hypoxia and hypercapnia ([place])  SECONDARY DIAGNOSIS:   Past Medical History:  Diagnosis Date  . Carpal tunnel syndrome   . COPD with asthma ([place])   . Diabetes mellitus    Diet control   . DJD (degenerative joint disease), cervical   . Duodenal ulcer, with partial obstruction 08/10/2012  . Essential and other specified forms of tremor 12/16/2013  . Hyperlipidemia   . Hypertension    no medicine needed  . IBS (irritable bowel syndrome)   . Internal hemorrhoids   . Memory deficit 12/16/2013  . Numbness and tingling in right hand    started 2 yeas ago  . Peptic ulcer   . Personal history of colonic polyps    adenomatous  . Polyneuropathy in diabetes(357.2)   . Tremor, essential   . Vitamin D deficiency    HOSPITAL COURSE:  Aaron Hendrix a 83 y.o. 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   1. Acute on chronic hypoxic hypercarbic respiratory failure: patient's insurance did not approve Trilogy.  Per discussion with care management.  They approve BiPAP with AVAP and is provided.   2. COPD exacerbation: resolved. DISCHARGE CONDITIONS:  stable CONSULTS OBTAINED:  Treatment Team:  [facility], [facility], MD DRUG ALLERGIES:   Allergies  Allergen Reactions  . Fexofenadine Other (See Comments)  . Quinapril Hcl Other (See Comments)    Unknown   . Latex Rash   DISCHARGE MEDICATIONS:   Allergies as of 03/23/2017      Reactions     Fexofenadine Other (See Comments)   Quinapril Hcl Other (See Comments)   Unknown    Latex Rash      Medication List    TAKE these medications   albuterol 108 (90 Base) MCG/ACT inhaler Commonly known as:  PROAIR HFA Inhale 1-2 puffs into the lungs every 6 (six) hours as needed for wheezing or shortness of breath.   chlorhexidine 0.12 % solution Commonly known as:  PERIDEX 15 mLs by Mouth Rinse route 2 (two) times daily.   finasteride 5 MG tablet Commonly known as:  PROSCAR Take 1 tablet (5 mg total) by mouth daily.   furosemide 20 MG tablet Commonly known as:  LASIX Take 1 tablet (20 mg total) by mouth daily. What changed:  how much to take   ipratropium-albuterol 0.5-2.5 (3) MG/3ML Soln Commonly known as:  DUONEB Take 3 mLs by nebulization every 6 (six) hours.   levothyroxine 50 MCG tablet Commonly known as:  SYNTHROID, LEVOTHROID Take 1 tablet (50 mcg total) by mouth daily before breakfast.   losartan 50 MG tablet Commonly known as:  COZAAR Take 1 tablet (50 mg total) by mouth daily.   metoprolol tartrate 25 MG tablet Commonly known as:  LOPRESSOR Take 1 tablet (25 mg total) by mouth 2 (two) times daily.   pantoprazole 40 MG tablet Commonly known as:  PROTONIX Take 1 tablet (40 mg total) by mouth daily.   phenol 1.4 % Liqd Commonly  known as:  CHLORASEPTIC Use as directed 1 spray in the mouth or throat as needed for throat irritation / pain.   predniSONE 10 MG (21) Tbpk tablet Commonly known as:  STERAPRED UNI-PAK 21 TAB Start 60 mg oral once daily, taper 10 mg daily until finish   saccharomyces boulardii 250 MG capsule Commonly known as:  FLORASTOR Take 250 mg by mouth as needed.   tamsulosin 0.4 MG Caps capsule Commonly known as:  FLOMAX Take 1 capsule (0.4 mg total) by mouth daily.   umeclidinium-vilanterol 62.5-25 MCG/INH Aepb Commonly known as:  ANORO ELLIPTA Inhale 1 puff into the lungs daily.   Vitamin D3 50000 units Caps Take 50,000 Units  by mouth once a week.            Durable Medical Equipment        Start     Ordered   03/22/17 1419  For home use only DME Bipap  Once    Comments:  Heated humidifier, tubing and masks, cushion and water chamber Back up set rate _ 8 BiPAP ST with AVAPS  Question Answer Comment  Inspiratory pressure 10   Expiratory pressure 5      03/22/17 1423       DISCHARGE INSTRUCTIONS:   DIET:  Regular diet DISCHARGE CONDITION:  Good ACTIVITY:  Activity as tolerated OXYGEN:  Home Oxygen: No.  Oxygen Delivery: room air DISCHARGE LOCATION:  home with palliative care to follow  If you experience worsening of your admission symptoms, develop shortness of breath, life threatening emergency, suicidal or homicidal thoughts you must seek medical attention immediately by calling 911 or calling your MD immediately  if symptoms less severe.  You Must read complete instructions/literature along with all the possible adverse reactions/side effects for all the Medicines you take and that have been prescribed to you. Take any new Medicines after you have completely understood and accpet all the possible adverse reactions/side effects.   Please note  You were cared for by a hospitalist during your hospital stay. If you have any questions about your discharge medications or the care you received while you were in the hospital after you are discharged, you can call the unit and asked to speak with the hospitalist on call if the hospitalist that took care of you is not available. Once you are discharged, your primary care physician will handle any further medical issues. Please note that NO REFILLS for any discharge medications will be authorized once you are discharged, as it is imperative that you return to your primary care physician (or establish a relationship with a primary care physician if you do not have one) for your aftercare needs so that they can reassess your need for medications and  monitor your lab values.    On the day of Discharge:  VITAL SIGNS:  Blood pressure 120/66, pulse 75, temperature 97.8 F (36.6 C), temperature source Oral, resp. rate 20, height 5\' 7"  (1.702 m), weight 65.6 kg (144 lb 10 oz), SpO2 100 %. PHYSICAL EXAMINATION:  GENERAL:  83 y.o.-year-old patient lying in the bed with no acute distress.  EYES: Pupils equal, round, reactive to light and accommodation. No scleral icterus. Extraocular muscles intact.  HEENT: Head atraumatic, normocephalic. Oropharynx and nasopharynx clear.  NECK:  Supple, no jugular venous distention. No thyroid enlargement, no tenderness.  LUNGS: Normal breath sounds bilaterally, no wheezing, rales,rhonchi or crepitation. No use of accessory muscles of respiration.  CARDIOVASCULAR: S1, S2 normal. No murmurs, rubs, or gallops.  ABDOMEN: Soft, non-tender, non-distended. Bowel sounds present. No organomegaly or mass.  EXTREMITIES: No pedal edema, cyanosis, or clubbing.  NEUROLOGIC: Cranial nerves II through XII are intact. Muscle strength 5/5 in all extremities. Sensation intact. Gait not checked.  PSYCHIATRIC: The patient is alert and oriented x 3.  SKIN: No obvious rash, lesion, or ulcer.  DATA REVIEW:   CBC  Recent Labs Lab 03/21/17 0606  WBC 6.2  HGB 13.2  HCT 39.4*  PLT 282    Chemistries   Recent Labs Lab 03/19/17 1238 03/20/17 0422 03/21/17 0606  NA 133* 133* 131*  K 4.2 4.6 4.2  CL 91* 92* 94*  CO2 37* 33* 31  GLUCOSE 67 161* 209*  BUN 22* 26* 32*  CREATININE 1.14 1.16 1.24  CALCIUM 8.9 8.7* 8.5*  MG  --  1.8  --   AST 17  --   --   ALT 11*  --   --   ALKPHOS 51  --   --   BILITOT 0.4  --   --      Follow-up Information    Aaron Pheasant, MD. Schedule an appointment as soon as possible for a visit in 1 week(s).   Specialty:  Internal Medicine Why:  office to call at home Contact information: [address] [place] [address] [phone]        [name], MD Follow up on 03/22/2017.   Specialty:  Pulmonary Disease Why:  at [address] Contact information: [place] [address] [phone]          Management plans discussed with the patient, family and they are in agreement.  CODE STATUS: Full Code   TOTAL TIME TAKING CARE OF THIS PATIENT: 45 minutes.    Max Sane M.D on 03/23/2017 at 3:31 PM  Between 7am to 6pm - Pager - [phone]  After 6pm go to [email or web address] - Proofreader  [facility]  Office  [phone]  CC: Primary care physician; Aaron Pheasant, MD   Note: This dictation was prepared with Dragon dictation along with smaller phrase technology. Any transcriptional errors that result from this process are unintentional.

## 2017-03-23 NOTE — Progress Notes (Signed)
After assessing documentation, patient awaiting for BIPAP with AVAP  VS stable  Recommend approval for this prior to discharge No other recommendations at this time   Corrin Parker, M.D.  [facility]  Medical Director [place] Director [facility] Cardio-Pulmonary Department

## 2017-03-23 NOTE — Progress Notes (Signed)
Discharge instructions and medication details given to patient and family. Printed AVS given to patient.  All questions answered.  Patient was escorted out via wheelchair.  Wynema Birch, RN

## 2017-03-23 NOTE — Telephone Encounter (Signed)
Transition Care Management Follow-up Telephone Call  How have you been since you were released from the hospital? Patient doing well since discharge per DPr patient wife.   Do you understand why you were in the hospital? Yes, he could not breathe.   Do you understand the discharge instrcutions? Yes,   Items Reviewed:  Medications reviewed:Yes  Allergies reviewed: Yes  Dietary changes reviewed: yes  Referrals reviewed: Yes   Functional Questionnaire:   Activities of Daily Living (ADLs):   He states they are independent in the following: In adls, walks with assist of walker, and wife assist's with bathing. States they require assistance with the following:Bathing and walking.   Any transportation issues/concerns?: No   Any patient concerns? Not at this time.   Confirmed importance and date/time of follow-up visits scheduled Yes, tried to schedule patient for 04/03/17 but he is going on vacation and cannot come into office that day.   Confirmed with patient if condition begins to worsen call PCP or go to the ER.  Patient was given the Call-a-Nurse line [phone]: Yes

## 2017-03-23 NOTE — Telephone Encounter (Signed)
Hold for work in appt.  If one not available, I will have to work in during lunch on Thursday 03/30/17.

## 2017-03-23 NOTE — Care Management (Signed)
Met with wife and son Rex. BiPAP to be delivered today at 3:15. They are then ready to take patient home. They deny any futher DME needs. [facility] notified of discharge. They were very complimentary of all the assistance the staff had provided while patient was here.

## 2017-03-23 NOTE — Telephone Encounter (Signed)
Patient husband discharge from hospital and requested a call [place] [phone]

## 2017-03-23 NOTE — Care Management (Signed)
Per [facility]nea [facility], patient to receive bipap this morning.

## 2017-03-23 NOTE — Telephone Encounter (Signed)
Patient still at [facility] waiting for [place], before discharge.

## 2017-03-24 NOTE — Telephone Encounter (Signed)
There is a hold on 03/28/17 @ 11:30 if this works for pt

## 2017-03-24 NOTE — Telephone Encounter (Signed)
Patient scheduled TCM complete.

## 2017-03-25 ENCOUNTER — Emergency Department: Payer: Medicare Other

## 2017-03-25 ENCOUNTER — Emergency Department
Admission: EM | Admit: 2017-03-25 | Discharge: 2017-03-25 | Disposition: A | Discharge status: Home / Self Care | Payer: Medicare Other | Attending: Emergency Medicine | Admitting: Emergency Medicine

## 2017-03-25 ENCOUNTER — Encounter: Payer: Self-pay | Admitting: Emergency Medicine

## 2017-03-25 DIAGNOSIS — E039 Hypothyroidism, unspecified: Secondary | ICD-10-CM | POA: Diagnosis not present

## 2017-03-25 DIAGNOSIS — E119 Type 2 diabetes mellitus without complications: Secondary | ICD-10-CM | POA: Insufficient documentation

## 2017-03-25 DIAGNOSIS — J9622 Acute and chronic respiratory failure with hypercapnia: Secondary | ICD-10-CM | POA: Insufficient documentation

## 2017-03-25 DIAGNOSIS — Z79899 Other long term (current) drug therapy: Secondary | ICD-10-CM | POA: Diagnosis not present

## 2017-03-25 DIAGNOSIS — J441 Chronic obstructive pulmonary disease with (acute) exacerbation: Secondary | ICD-10-CM | POA: Diagnosis not present

## 2017-03-25 DIAGNOSIS — Z87891 Personal history of nicotine dependence: Secondary | ICD-10-CM | POA: Insufficient documentation

## 2017-03-25 DIAGNOSIS — R7989 Other specified abnormal findings of blood chemistry: Secondary | ICD-10-CM

## 2017-03-25 DIAGNOSIS — R4182 Altered mental status, unspecified: Secondary | ICD-10-CM

## 2017-03-25 DIAGNOSIS — I10 Essential (primary) hypertension: Secondary | ICD-10-CM | POA: Diagnosis not present

## 2017-03-25 LAB — CBC WITH DIFFERENTIAL/PLATELET
Basophils Absolute: 0 10*3/uL (ref 0–0.1)
Basophils Relative: 0 %
EOS ABS: 0 10*3/uL (ref 0–0.7)
Eosinophils Relative: 0 %
HCT: 36.8 % — ABNORMAL LOW (ref 40.0–52.0)
Hemoglobin: 12.2 g/dL — ABNORMAL LOW (ref 13.0–18.0)
LYMPHS ABS: 0.3 10*3/uL — AB (ref 1.0–3.6)
Lymphocytes Relative: 5 %
MCH: 30.4 pg (ref 26.0–34.0)
MCHC: 33.1 g/dL (ref 32.0–36.0)
MCV: 91.9 fL (ref 80.0–100.0)
MONOS PCT: 4 %
Monocytes Absolute: 0.3 10*3/uL (ref 0.2–1.0)
Neutro Abs: 5.8 10*3/uL (ref 1.4–6.5)
Neutrophils Relative %: 91 %
PLATELETS: 253 10*3/uL (ref 150–440)
RBC: 4.01 MIL/uL — ABNORMAL LOW (ref 4.40–5.90)
RDW: 14.1 % (ref 11.5–14.5)
WBC: 6.4 10*3/uL (ref 3.8–10.6)

## 2017-03-25 LAB — BLOOD GAS, ARTERIAL
ACID-BASE EXCESS: 7.3 mmol/L — AB (ref 0.0–2.0)
Bicarbonate: 32.6 mmol/L — ABNORMAL HIGH (ref 20.0–28.0)
FIO2: 0.21
O2 SAT: 95 %
PCO2 ART: 48 mmHg (ref 32.0–48.0)
Patient temperature: 37
pH, Arterial: 7.44 (ref 7.350–7.450)
pO2, Arterial: 73 mmHg — ABNORMAL LOW (ref 83.0–108.0)

## 2017-03-25 LAB — COMPREHENSIVE METABOLIC PANEL
ALT: 12 U/L — AB (ref 17–63)
AST: 22 U/L (ref 15–41)
Albumin: 3.6 g/dL (ref 3.5–5.0)
Alkaline Phosphatase: 49 U/L (ref 38–126)
Anion gap: 10 (ref 5–15)
BUN: 33 mg/dL — ABNORMAL HIGH (ref 6–20)
CHLORIDE: 93 mmol/L — AB (ref 101–111)
CO2: 28 mmol/L (ref 22–32)
CREATININE: 1.25 mg/dL — AB (ref 0.61–1.24)
Calcium: 8.9 mg/dL (ref 8.9–10.3)
GFR, EST AFRICAN AMERICAN: 59 mL/min — AB (ref >60)
GFR, EST NON AFRICAN AMERICAN: 51 mL/min — AB (ref >60)
Glucose, Bld: 178 mg/dL — ABNORMAL HIGH (ref 65–99)
POTASSIUM: 4.4 mmol/L (ref 3.5–5.1)
Sodium: 131 mmol/L — ABNORMAL LOW (ref 135–145)
TOTAL PROTEIN: 6.7 g/dL (ref 6.5–8.1)
Total Bilirubin: 0.5 mg/dL (ref 0.3–1.2)

## 2017-03-25 LAB — URINALYSIS, COMPLETE (UACMP) WITH MICROSCOPIC
BILIRUBIN URINE: NEGATIVE
Bacteria, UA: NONE SEEN
GLUCOSE, UA: 50 mg/dL — AB
HGB URINE DIPSTICK: NEGATIVE
Ketones, ur: NEGATIVE mg/dL
Leukocytes, UA: NEGATIVE
NITRITE: NEGATIVE
Protein, ur: NEGATIVE mg/dL
SPECIFIC GRAVITY, URINE: 1.014 (ref 1.005–1.030)
pH: 7 (ref 5.0–8.0)

## 2017-03-25 LAB — TROPONIN I

## 2017-03-25 LAB — LACTIC ACID, PLASMA
LACTIC ACID, VENOUS: 2.5 mmol/L — AB (ref 0.5–1.9)
Lactic Acid, Venous: 2.3 mmol/L (ref 0.5–1.9)

## 2017-03-25 LAB — MAGNESIUM: MAGNESIUM: 1.5 mg/dL — AB (ref 1.7–2.4)

## 2017-03-25 MED ORDER — SODIUM CHLORIDE 0.9 % IV BOLUS (SEPSIS)
1000.0000 mL | INTRAVENOUS | Status: AC
  Administered 2017-03-25: 1000 mL via INTRAVENOUS

## 2017-03-25 NOTE — ED Triage Notes (Signed)
Pt. Here via EMS from home for Alterned metal status and breathing difficulty.  Pt. Was discharged from CCU yesterday.

## 2017-03-25 NOTE — ED Notes (Signed)
Pt. Going home with family. 

## 2017-03-25 NOTE — Discharge Instructions (Signed)
Your workup in the Emergency Department today was reassuring.  We did not find any specific abnormalities except for the elevated lactic acid level which we discussed.  Although it is elevated, it came down after some IV fluids.  We offered admission to the hospital but you prefer to go home, and we feel that is appropriate since you are feeling so much better.  Remember to drink plenty of clear fluids such as water, low-calorie Gatorade, or other fluids.  Ttake your regular medications and/or any new ones prescribed today, and follow up with the doctor(s) listed in these documents as recommended.  Return to the Emergency Department if you develop new or worsening symptoms that concern you.

## 2017-03-25 NOTE — ED Provider Notes (Signed)
[facility] Emergency Department Provider Note  ____________________________________________   First MD Initiated Contact with Patient 03/25/17 0100     (approximate)  I have reviewed the triage vital signs and the nursing notes.   HISTORY  Chief Complaint Altered Mental Status  Level 5 caveat:  history/ROS limited by acute/critical illness  HPI Aaron Hendrix is a 83 y.o. male with a history of recent admissions for altered mental status in the setting of hypercapnia due to his COPD.  He presents by EMS with altered mental status, somewhat combative and confused, and presenting, according to his wife, as he has in the past when he is hypercapnic.  He uses BiPAP at home at night and had a normal evening according to her but then started putting on his BiPAP and became very confused and agitated, turning the dialysis and messing around with the mask.  He was "talking out of his head" and they were not able to calm him down so they called EMS.  The patient is not able to provide any extra history at this time and is agitated and confused and resisting our attempts to help them although he is redirectable.  He does report some difficulty breathing although it is unclear if this is acute.  He denies any chest pain and abdominal pain.  No other information is available from the patient at this time.   Past Medical History:  Diagnosis Date  . Carpal tunnel syndrome   . COPD with asthma ([place])   . Diabetes mellitus    Diet control   . DJD (degenerative joint disease), cervical   . Duodenal ulcer, with partial obstruction 08/10/2012  . Essential and other specified forms of tremor 12/16/2013  . Hyperlipidemia   . Hypertension    no medicine needed  . IBS (irritable bowel syndrome)   . Internal hemorrhoids   . Memory deficit 12/16/2013  . Numbness and tingling in right hand    started 2 yeas ago  . Peptic ulcer   . Personal history of colonic polyps    adenomatous  . Polyneuropathy in diabetes(357.2)   . Tremor, essential   . Vitamin D deficiency     Patient Active Problem List   Diagnosis Date Noted  . Respiratory failure with hypoxia and hypercapnia ([place]) 03/19/2017  . Palliative care by specialist 03/09/2017  . Hypercapnia   . Acute on chronic respiratory failure with hypercapnia ([place]) 03/08/2017  . Hypoxia 11/25/2016  . COPD exacerbation ([place]) 11/25/2016  . Rash 01/18/2016  . Nocturia 08/11/2015  . BPH with obstruction/lower urinary tract symptoms 08/11/2015  . Hematemesis 07/29/2015  . Paralytic ileus ([place]) 07/29/2015  . Lower extremity edema 07/16/2015  . Increased urinary frequency 07/16/2015  . COPD (chronic obstructive pulmonary disease) ([place]) 07/16/2015  . Malnutrition of moderate degree ([place]) 07/02/2015  . Community acquired pneumonia   . Hyponatremia 06/25/2015  . Skin lesion 02/16/2015  . DNR (do not resuscitate) discussion 02/16/2015  . Elbow abrasion 09/27/2014  . Hypothyroidism 09/27/2014  . Chronic constipation 09/01/2014  . Trigger thumb 06/22/2014  . Abdominal pain 06/22/2014  . Personal history of other specified conditions 02/20/2014  . Decreased anal sphincter tone 02/19/2014  . Other specified symptoms and signs involving the digestive system and abdomen 02/19/2014  . Diabetes mellitus ([place]) 02/19/2014  . Farts 02/19/2014  . Memory deficit 12/16/2013  . Essential and other specified forms of tremor 12/16/2013  . Amnesia 12/16/2013  . Diarrhea 10/24/2013  . Loss of weight  09/10/2013  . Periumbilical abdominal pain 09/10/2013  . Pain in lower limb 07/01/2013  . Dermatophytosis of foot 07/01/2013  . Degenerative arthritis of hip 11/13/2012  . Osteoarthritis of hip 11/13/2012  . DU (duodenal ulcer) 08/22/2012  . Nausea 08/10/2012  . Hypertension 05/25/2011  . Diabetes mellitus, type II ([place]) 05/25/2011  . Irritable bowel syndrome 03/31/2008  . History of colonic polyps 03/31/2008  . Adaptive  colitis 03/31/2008    Past Surgical History:  Procedure Laterality Date  . ANAL FISSURE REPAIR    . ANKLE SURGERY Right    right- pins placed in  . COLONOSCOPY W/ BIOPSIES AND POLYPECTOMY  8/03, 6/05, 7/09, 9/10   internal hemorrhoids, tubular adenomas, mucosa & lymphoid nodules  . INGUINAL HERNIA REPAIR Right   . TOTAL HIP ARTHROPLASTY Right 11/13/2012   Procedure: TOTAL HIP ARTHROPLASTY ANTERIOR APPROACH;  Surgeon: Mcarthur Rossetti, MD;  Location: [place];  Service: Orthopedics;  Laterality: Right;  Right total hip arthroplasty  . UPPER GASTROINTESTINAL ENDOSCOPY  3/05, 7/09, 9/10,2013   gastritis, duodenitis    Prior to Admission medications   Medication Sig Start Date End Date Taking? Authorizing Provider  albuterol (PROAIR HFA) 108 (90 Base) MCG/ACT inhaler Inhale 1-2 puffs into the lungs every 6 (six) hours as needed for wheezing or shortness of breath. 07/07/16  Yes [name], MD  chlorhexidine (PERIDEX) 0.12 % solution 15 mLs by Mouth Rinse route 2 (two) times daily. 03/22/17  Yes Max Sane, MD  Cholecalciferol (VITAMIN D3) 50000 units CAPS Take 50,000 Units by mouth once a week.   Yes [provider]  finasteride (PROSCAR) 5 MG tablet Take 1 tablet (5 mg total) by mouth daily. 11/14/16  Yes [name], [name], PA-C  furosemide (LASIX) 20 MG tablet Take 1 tablet (20 mg total) by mouth daily. 07/22/15  Yes Max Sane, MD  ipratropium-albuterol (DUONEB) 0.5-2.5 (3) MG/3ML SOLN Take 3 mLs by nebulization every 6 (six) hours. 03/11/17  Yes Dustin Flock, MD  levothyroxine (SYNTHROID, LEVOTHROID) 50 MCG tablet Take 1 tablet (50 mcg total) by mouth daily before breakfast. 12/02/16  Yes [name], MD  losartan (COZAAR) 50 MG tablet Take 1 tablet (50 mg total) by mouth daily. 12/02/16  Yes [name], MD  metoprolol tartrate (LOPRESSOR) 25 MG tablet Take 1 tablet (25 mg total) by mouth 2 (two) times daily. 03/22/17  Yes Max Sane, MD  pantoprazole (PROTONIX)  40 MG tablet Take 1 tablet (40 mg total) by mouth daily. 12/02/16  Yes [name], MD  phenol (CHLORASEPTIC) 1.4 % LIQD Use as directed 1 spray in the mouth or throat as needed for throat irritation / pain. 03/22/17  Yes Max Sane, MD  predniSONE (STERAPRED UNI-PAK 21 TAB) 10 MG (21) TBPK tablet Start 60 mg oral once daily, taper 10 mg daily until finish 03/22/17  Yes Max Sane, MD  saccharomyces boulardii (FLORASTOR) 250 MG capsule Take 250 mg by mouth as needed.    Yes [provider]  tamsulosin (FLOMAX) 0.4 MG CAPS capsule Take 1 capsule (0.4 mg total) by mouth daily. 11/14/16  Yes [name], [name], PA-C  umeclidinium-vilanterol (ANORO ELLIPTA) 62.5-25 MCG/INH AEPB Inhale 1 puff into the lungs daily. 03/06/17  Yes [name], MD    Allergies Fexofenadine; Quinapril hcl; and Latex  Family History  Problem Relation Age of Onset  . Thyroid cancer Daughter 84  . Diabetes Son   . Urolithiasis Son   . Leukemia Maternal Aunt   . Colon cancer  Neg Hx   . Esophageal cancer Neg Hx   . Rectal cancer Neg Hx   . Stomach cancer Neg Hx   . Prostate cancer Neg Hx   . Kidney disease Neg Hx   . Kidney cancer Neg Hx     Social History Social History  Substance Use Topics  . Smoking status: Former Smoker    Types: Cigarettes    Quit date: 07/02/1963  . Smokeless tobacco: Never Used  . Alcohol use No    Review of Systems Level 5 caveat:  history/ROS limited by acute/critical illness ____________________________________________   PHYSICAL EXAM:  VITAL SIGNS: ED Triage Vitals  Enc Vitals Group     BP 03/25/17 0107 (!) 157/82     Pulse Rate 03/25/17 0107 97     Resp 03/25/17 0107 (!) 21     Temp 03/25/17 0107 98.4 F (36.9 C)     Temp Source 03/25/17 0107 Oral     SpO2 03/25/17 0101 98 %     Weight 03/25/17 0108 65.8 kg (145 lb)     Height 03/25/17 0108 1.626 m (5\' 4" )     Head Circumference --      Peak Flow --      Pain Score 03/25/17 0103 4     Pain Loc --        Pain Edu? --      Excl. in [place]? --     Constitutional: Awake, agitated, confused, mild to moderate distress but due to his confusion rather than to respiratory difficulties Eyes: Conjunctivae are normal.  Head: Atraumatic. Nose: No congestion/rhinnorhea. Mouth/Throat: Mucous membranes are moist. Neck: No stridor.  No meningeal signs.   Cardiovascular: Normal rate, regular rhythm. Good peripheral circulation. Grossly normal heart sounds. Respiratory: Increased respiratory rate but Normal respiratory effort.  No retractions. Lungs CTAB. Gastrointestinal: Soft and nontender. No distention.  Musculoskeletal: No lower extremity tenderness nor edema. No gross deformities of extremities. Neurologic:  Normal speech and language. No gross focal neurologic deficits are appreciated.  Skin:  Skin is warm, dry and intact. No rash noted.   ____________________________________________   LABS (all labs ordered are listed, but only abnormal results are displayed)  Labs Reviewed  BLOOD GAS, ARTERIAL - Abnormal; Notable for the following:       Result Value   pO2, Arterial 73 (*)    Bicarbonate 32.6 (*)    Acid-Base Excess 7.3 (*)    All other components within normal limits  CBC WITH DIFFERENTIAL/PLATELET - Abnormal; Notable for the following:    RBC 4.01 (*)    Hemoglobin 12.2 (*)    HCT 36.8 (*)    Lymphs Abs 0.3 (*)    All other components within normal limits  COMPREHENSIVE METABOLIC PANEL - Abnormal; Notable for the following:    Sodium 131 (*)    Chloride 93 (*)    Glucose, Bld 178 (*)    BUN 33 (*)    Creatinine, Ser 1.25 (*)    ALT 12 (*)    GFR calc non Af Amer 51 (*)    GFR calc Af Amer 59 (*)    All other components within normal limits  MAGNESIUM - Abnormal; Notable for the following:    Magnesium 1.5 (*)    All other components within normal limits  LACTIC ACID, PLASMA - Abnormal; Notable for the following:    Lactic Acid, Venous 2.5 (*)    All other components  within normal limits  LACTIC ACID, PLASMA -  Abnormal; Notable for the following:    Lactic Acid, Venous 2.3 (*)    All other components within normal limits  URINALYSIS, COMPLETE (UACMP) WITH MICROSCOPIC - Abnormal; Notable for the following:    Color, Urine YELLOW (*)    APPearance CLEAR (*)    Glucose, UA 50 (*)    Squamous Epithelial / LPF 0-5 (*)    All other components within normal limits  TROPONIN I   ____________________________________________  EKG  ED ECG REPORT I, Gideon Burstein, the attending physician, personally viewed and interpreted this ECG.  Date: 03/25/2017 EKG Time: 1:06 AM Rate: 99 Rhythm: Sinus rhythm with right bundle branch block and left anterior fascicular block QRS Axis: normal Intervals: right bundle branch block and left anterior fascicular block ST/T Wave abnormalities: normal Narrative Interpretation: unremarkable  ____________________________________________  RADIOLOGY   Dg Chest Portable 1 View  Result Date: 03/25/2017 CLINICAL DATA:  Altered mental status and dyspnea today. Hospital discharge yesterday. EXAM: PORTABLE CHEST 1 VIEW COMPARISON:  03/19/2017 FINDINGS: Shallow inspiration. The lungs are clear. The pulmonary vasculature is normal. There is no pleural effusion. Heart size is normal. Hilar and mediastinal contours are unremarkable and unchanged. IMPRESSION: No acute findings Electronically Signed   By: Andreas Newport M.D.   On: 03/25/2017 01:51    ____________________________________________   PROCEDURES  Critical Care performed: No   Procedure(s) performed:   Procedures   ____________________________________________   INITIAL IMPRESSION / ASSESSMENT AND PLAN / ED COURSE  Pertinent labs & imaging results that were available during my care of the patient were reviewed by me and considered in my medical decision making (see chart for details).  The patient has reassuring vital signs except for some mild tachypnea  due to his agitation.  His lung sounds are clear and his oxygenation is normal.  I reviewed the electronic medical record and see that he has had similar presentations in the past with PCO2 is high as 120.  I called respiratory therapy and asked them to obtain an ABG and I will evaluate him broadly.  There does not seem to be any infectious symptoms at this time but I will check a lactic acid in addition to normal labs, chest x-ray, EKG, etc.  Family is in agreement with this plan.   Clinical Course as of Mar 25 754  Sat Mar 25, 2017  0408 Delay documentation due to multiple critical patients in the ED.  The patient was feeling much better when I reassessed him a couple of hours ago and his family is comfortable with the plan for discharge if his repeat lactic acid after a liter of fluids has gone down.  He has no signs or symptoms of infection at this point and his mental status has improved.  His ABG was reassuring with no sign of hypercapnia.  I will reassess after the repeat lactic acid after a liter fluid bolus.  Again, he does not meet sepsis criteria at this time  [CF]  0551 Patient remains calm, alert, and oriented.  He feels and looks like "a new man" since he came in.  Son and wife are both present.  I explained that his lactic acid came down a little bit after 1 L of fluids although not as much as I would have liked.  I offered that we could bring him into the hospital for additional hydration because I think that the lactic acid is elevated due to volume depletion.  They are all very comfortable taking him home  at this point and following up as an outpatient.  I explained that that was acceptable but I gave strict return precautions if he were to get worse.  They understand and said that once they get home and they will make sure he is back on his BiPAP.  He promises to drink plenty of clear fluids such as low-calorie Gatorade and water and asked me several questions about if this will be  appropriate.  I reviewed his labs again and he has no sign of infection, his pH is normal in spite of the very slight lactic acid elevation, and I think he is stable for discharge home as per family wishes.  [CF]    Clinical Course User Index [CF] Hinda Kehr, MD    ____________________________________________  FINAL CLINICAL IMPRESSION(S) / ED DIAGNOSES  Final diagnoses:  Altered mental status, unspecified altered mental status type  Elevated lactic acid level     MEDICATIONS GIVEN DURING THIS VISIT:  Medications  sodium chloride 0.9 % bolus 1,000 mL (0 mLs Intravenous Stopped 03/25/17 0420)     NEW OUTPATIENT MEDICATIONS STARTED DURING THIS VISIT:  Discharge Medication List as of 03/25/2017  5:57 AM      Discharge Medication List as of 03/25/2017  5:57 AM      Discharge Medication List as of 03/25/2017  5:57 AM       Note:  This document was prepared using Dragon voice recognition software and may include unintentional dictation errors.    Hinda Kehr, MD 03/25/17 [phone]

## 2017-03-27 ENCOUNTER — Encounter: Payer: Self-pay | Admitting: Podiatry

## 2017-03-27 ENCOUNTER — Telehealth: Payer: Self-pay | Admitting: *Deleted

## 2017-03-27 ENCOUNTER — Ambulatory Visit (INDEPENDENT_AMBULATORY_CARE_PROVIDER_SITE_OTHER): Payer: Medicare Other | Admitting: Podiatry

## 2017-03-27 DIAGNOSIS — M79609 Pain in unspecified limb: Secondary | ICD-10-CM

## 2017-03-27 DIAGNOSIS — B351 Tinea unguium: Secondary | ICD-10-CM

## 2017-03-27 NOTE — Telephone Encounter (Signed)
[facility] has requested a verbal order to continue for home health , home health aide . Patient is now using a Bi Pap.  Contact Olin Hauser [phone]

## 2017-03-27 NOTE — Progress Notes (Signed)
Complaint:  Visit Type: Patient returns to my office for continued preventative foot care services. Complaint: Patient states" my nails have grown long and thick and become painful to walk and wear shoes" . The patient presents for preventative foot care services. No changes to ROS  Podiatric Exam: Vascular: dorsalis pedis and posterior tibial pulses are palpable bilateral. Capillary return is immediate. Temperature gradient is WNL. Skin turgor WNL  Sensorium: Normal Semmes Weinstein monofilament test. Normal tactile sensation bilaterally. Nail Exam: Pt has thick disfigured discolored nails with subungual debris noted bilateral entire nail hallux through fifth toenails Ulcer Exam: There is no evidence of ulcer or pre-ulcerative changes or infection. Orthopedic Exam: Muscle tone and strength are WNL. No limitations in general ROM. No crepitus or effusions noted. Foot type and digits show no abnormalities. Bony prominences are unremarkable. Skin: No Porokeratosis. No infection or ulcers  Diagnosis:  Onychomycosis, , Pain in right toe, pain in left toes  Treatment & Plan Procedures and Treatment: Consent by patient was obtained for treatment procedures. The patient understood the discussion of treatment and procedures well. All questions were answered thoroughly reviewed. Debridement of mycotic and hypertrophic toenails, 1 through 5 bilateral and clearing of subungual debris. No ulceration, no infection noted.  Return Visit-Office Procedure: Patient instructed to return to the office for a follow up visit 10 weeks  for continued evaluation and treatment.    Gardiner Barefoot DPM

## 2017-03-28 ENCOUNTER — Ambulatory Visit (INDEPENDENT_AMBULATORY_CARE_PROVIDER_SITE_OTHER): Payer: Medicare Other | Admitting: Internal Medicine

## 2017-03-28 ENCOUNTER — Encounter: Payer: Self-pay | Admitting: Internal Medicine

## 2017-03-28 VITALS — BP 134/68 | HR 69 | Temp 98.2°F | Resp 12 | Ht 64.0 in | Wt 146.8 lb

## 2017-03-28 DIAGNOSIS — J441 Chronic obstructive pulmonary disease with (acute) exacerbation: Secondary | ICD-10-CM | POA: Diagnosis not present

## 2017-03-28 DIAGNOSIS — J9611 Chronic respiratory failure with hypoxia: Secondary | ICD-10-CM

## 2017-03-28 DIAGNOSIS — I10 Essential (primary) hypertension: Secondary | ICD-10-CM

## 2017-03-28 DIAGNOSIS — E039 Hypothyroidism, unspecified: Secondary | ICD-10-CM

## 2017-03-28 DIAGNOSIS — J9612 Chronic respiratory failure with hypercapnia: Secondary | ICD-10-CM | POA: Diagnosis not present

## 2017-03-28 DIAGNOSIS — R600 Localized edema: Secondary | ICD-10-CM

## 2017-03-28 DIAGNOSIS — R410 Disorientation, unspecified: Secondary | ICD-10-CM | POA: Diagnosis not present

## 2017-03-28 DIAGNOSIS — E119 Type 2 diabetes mellitus without complications: Secondary | ICD-10-CM

## 2017-03-28 DIAGNOSIS — E871 Hypo-osmolality and hyponatremia: Secondary | ICD-10-CM

## 2017-03-28 LAB — HEMOGLOBIN A1C: HEMOGLOBIN A1C: 6.5 % (ref 4.6–6.5)

## 2017-03-28 LAB — BASIC METABOLIC PANEL
BUN: 24 mg/dL — ABNORMAL HIGH (ref 6–23)
CHLORIDE: 92 meq/L — AB (ref 96–112)
CO2: 36 mEq/L — ABNORMAL HIGH (ref 19–32)
Calcium: 9.3 mg/dL (ref 8.4–10.5)
Creatinine, Ser: 1.16 mg/dL (ref 0.40–1.50)
GFR: 77.03 mL/min (ref >60.00)
Glucose, Bld: 147 mg/dL — ABNORMAL HIGH (ref 70–99)
POTASSIUM: 4.3 meq/L (ref 3.5–5.1)
SODIUM: 133 meq/L — AB (ref 135–145)

## 2017-03-28 LAB — MAGNESIUM: MAGNESIUM: 1.6 mg/dL (ref 1.5–2.5)

## 2017-03-28 NOTE — Progress Notes (Signed)
[name], male   DOB: [birth year], 83 y.o.   MRN: [id number]   Subjective:    [name], male    DOB: 25-Mar-1930, 83 y.o.   MRN: [id number]  HPI  Patient here for hospital follow up.  He is accompanied by his wife and daughter.  History obtained from all of them.  He was admitted 03/19/17 with respiratory failure with hypoxia and hypercapnia.  This was his second admission in June.  Hospital notes reviewed.  He had been on CPAP at home.  Presented initially with sob.  Found to be obtunded and unresponsive.  CO2 - 121.  Was placed on BiPAP first admission.  Apparently insurance did not approve Trilogy.  Returned on 03/19/17 with confusion and increased CO2.  Placed on BiPAP and symptoms improved.  Returned to ER on 03/25/17 and per daughter, was dehydrated with decrease in sodium.  Since discharge and ER visit, he has been using BiPAP regularly.  Eating.  They have been keeping him more hydrated.  No chest pain.  Slept better last night.  Appears to have episodes where he seems more anxious and will start praying. This appears to be better with improvement in his respiratory status.  Is on metoprolol.  Was started in hospital.  Was noted to have increased heart rate.  Has only been taking metoprolol q day.  Doing well and tolerating.  Heart rate improved.  Will continue daily metoprolol for now.  No abdominal pain.  Bowels moving.  Overall they feel he is doing better.  Family request earlier appt with pulmonary and request appointment with Dr Jannifer Franklin.     Past Medical History:  Diagnosis Date  . Carpal tunnel syndrome   . COPD with asthma ([place])   . Diabetes mellitus    Diet control   . DJD (degenerative joint disease), cervical   . Duodenal ulcer, with partial obstruction 08/10/2012  . Essential and other specified forms of tremor 12/16/2013  . Hyperlipidemia   . Hypertension    no medicine needed  . IBS (irritable bowel syndrome)   . Internal hemorrhoids     . Memory deficit 12/16/2013  . Numbness and tingling in right hand    started 2 yeas ago  . Peptic ulcer   . Personal history of colonic polyps    adenomatous  . Polyneuropathy in diabetes(357.2)   . Tremor, essential   . Vitamin D deficiency    Past Surgical History:  Procedure Laterality Date  . ANAL FISSURE REPAIR    . ANKLE SURGERY Right    right- pins placed in  . COLONOSCOPY W/ BIOPSIES AND POLYPECTOMY  8/03, 6/05, 7/09, 9/10   internal hemorrhoids, tubular adenomas, mucosa & lymphoid nodules  . INGUINAL HERNIA REPAIR Right   . TOTAL HIP ARTHROPLASTY Right 11/13/2012   Procedure: TOTAL HIP ARTHROPLASTY ANTERIOR APPROACH;  Surgeon: Mcarthur Rossetti, MD;  Location: [facility];  Service: Orthopedics;  Laterality: Right;  Right total hip arthroplasty  . UPPER GASTROINTESTINAL ENDOSCOPY  3/05, 7/09, 9/10,2013   gastritis, duodenitis   Family History  Problem Relation Age of Onset  . Thyroid cancer Daughter 18  . Diabetes Son   . Urolithiasis Son   . Leukemia Maternal Aunt   . Colon cancer Neg Hx   . Esophageal cancer Neg Hx   . Rectal cancer Neg Hx   . Stomach cancer Neg Hx   . Prostate cancer Neg Hx   . Kidney disease Neg Hx   .  Kidney cancer Neg Hx    Social History   Social History  . Marital status: Married    Spouse name: N/A  . Number of children: 3  . Years of education: MA   Occupational History  . 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 Smoking status: Former Smoker    Types: Cigarettes    Quit date: 07/02/1963  . Smokeless tobacco: Never Used  . Alcohol use No  . Drug use: No  . Sexual activity: Not Asked   Other Topics Concern  . None   Social History Narrative   Veteran [employer]    Outpatient Encounter Prescriptions as of 03/28/2017  Medication Sig  . albuterol (PROAIR HFA) 108 (90 Base) MCG/ACT inhaler Inhale 1-2 puffs into the lungs every 6 (six) hours as needed for wheezing or shortness of breath.  .  chlorhexidine (PERIDEX) 0.12 % solution 15 mLs by Mouth Rinse route 2 (two) times daily.  . Cholecalciferol (VITAMIN D3) 50000 units CAPS Take 50,000 Units by mouth once a week.  . finasteride (PROSCAR) 5 MG tablet Take 1 tablet (5 mg total) by mouth daily.  . furosemide (LASIX) 20 MG tablet Take 1 tablet (20 mg total) by mouth daily. (Patient taking differently: Take 40 mg by mouth daily. )  . ipratropium-albuterol (DUONEB) 0.5-2.5 (3) MG/3ML SOLN Take 3 mLs by nebulization every 6 (six) hours.  Marland Kitchen levothyroxine (SYNTHROID, LEVOTHROID) 50 MCG tablet Take 1 tablet (50 mcg total) by mouth daily before breakfast.  . losartan (COZAAR) 50 MG tablet Take 1 tablet (50 mg total) by mouth daily.  . metoprolol tartrate (LOPRESSOR) 25 MG tablet Take 1 tablet (25 mg total) by mouth 2 (two) times daily.  . pantoprazole (PROTONIX) 40 MG tablet Take 1 tablet (40 mg total) by mouth daily.  . phenol (CHLORASEPTIC) 1.4 % LIQD Use as directed 1 spray in the mouth or throat as needed for throat irritation / pain.  . predniSONE (STERAPRED UNI-PAK 21 TAB) 10 MG (21) TBPK tablet Start 60 mg oral once daily, taper 10 mg daily until finish  . saccharomyces boulardii (FLORASTOR) 250 MG capsule Take 250 mg by mouth as needed.   . tamsulosin (FLOMAX) 0.4 MG CAPS capsule Take 1 capsule (0.4 mg total) by mouth daily.  Marland Kitchen umeclidinium-vilanterol (ANORO ELLIPTA) 62.5-25 MCG/INH AEPB Inhale 1 puff into the lungs daily.  . Magnesium Oxide -Mg Supplement 400 MG CAPS Take 400 mg by mouth daily.   No facility-administered encounter medications on file as of 03/28/2017.     Review of Systems  Constitutional: Negative for appetite change and unexpected weight change.  HENT: Negative for congestion and sinus pressure.   Respiratory: Negative for cough and chest tightness.   Cardiovascular: Negative for chest pain and palpitations.       Wife reported some increased leg swelling.  Better now.   Gastrointestinal: Negative for abdominal  pain, diarrhea, nausea and vomiting.  Genitourinary: Negative for difficulty urinating and dysuria.  Musculoskeletal: Negative for back pain and joint swelling.  Skin: Negative for color change and rash.  Neurological: Negative for dizziness, light-headedness and headaches.  Psychiatric/Behavioral:       Confusion and agitation better now.  Appears to be better with improved respiratory status.         Objective:    Physical Exam  Constitutional: He appears well-developed and well-nourished. No distress.  HENT:  Nose: Nose normal.  Mouth/Throat: Oropharynx is clear and moist.  Neck: Neck supple.  Cardiovascular: Normal rate and regular rhythm.   Pulmonary/Chest: Effort normal and breath sounds normal. No respiratory distress.  Abdominal: Soft. Bowel sounds are normal. There is no tenderness.  Musculoskeletal: He exhibits no tenderness.  No increased edema.    Lymphadenopathy:    He has no cervical adenopathy.  Neurological:  Answering questions appropriately.    Skin: No rash noted. No erythema.  Psychiatric: He has a normal mood and affect. His behavior is normal.    BP 134/68 (BP Location: Right Arm, Patient Position: Sitting, Cuff Size: Normal)   Pulse 69   Temp 98.2 F (36.8 C) (Oral)   Resp 12   Ht 5\' 4"  (1.626 m)   Wt 146 lb 12.8 oz (66.6 kg)   SpO2 96%   BMI 25.20 kg/m  Wt Readings from Last 3 Encounters:  03/28/17 146 lb 12.8 oz (66.6 kg)  03/25/17 145 lb (65.8 kg)  03/19/17 144 lb 10 oz (65.6 kg)     Lab Results  Component Value Date   WBC 6.4 03/25/2017   HGB 12.2 (L) 03/25/2017   HCT 36.8 (L) 03/25/2017   PLT 253 03/25/2017   GLUCOSE 147 (H) 03/28/2017   CHOL 223 (H) 06/24/2015   TRIG 53.0 06/24/2015   HDL 96.10 06/24/2015   LDLCALC 116 (H) 06/24/2015   ALT 12 (L) 03/25/2017   AST 22 03/25/2017   NA 133 (L) 03/28/2017   K 4.3 03/28/2017   CL 92 (L) 03/28/2017   CREATININE 1.16 03/28/2017   BUN 24 (H) 03/28/2017   CO2 36 (H) 03/28/2017    TSH 1.38 04/21/2016   PSA 0.83 02/06/2015   INR 0.94 11/06/2012   HGBA1C 6.5 03/28/2017   MICROALBUR 93.8 (H) 06/24/2015    Dg Chest Portable 1 View  Result Date: 03/25/2017 CLINICAL DATA:  Altered mental status and dyspnea today. Hospital discharge yesterday. EXAM: PORTABLE CHEST 1 VIEW COMPARISON:  03/19/2017 FINDINGS: Shallow inspiration. The lungs are clear. The pulmonary vasculature is normal. There is no pleural effusion. Heart size is normal. Hilar and mediastinal contours are unremarkable and unchanged. IMPRESSION: No acute findings Electronically Signed   By: Andreas Newport M.D.   On: 03/25/2017 01:51       Assessment & Plan:   Problem List Items Addressed This Visit    Confusion    Admitted with confusion and agitation.  Appears to be doing much better with improvement in respiratory status.  Discussed at length with them today.  Continue current inhaler and neb regimen.  Family request f/u with Dr Jannifer Franklin as well (for further evaluation).        Relevant Orders   Ambulatory referral to Neurology   COPD (chronic obstructive pulmonary disease) ([place])    Has been followed by Dr Alva Garnet.  On BiPAP now.  Continue inhalers and nebs.  Overall doing better.  Request earlier appt with pulmonary.        Diabetes mellitus ([place]) - Primary   Relevant Orders   Hemoglobin A1c (Completed)   Hypertension    Blood pressure under good control.  Continue same medication regimen.  Follow pressures.  Follow metabolic panel.        Hyponatremia    Decreased in ER.  Recheck sodium level today.  Follow.  Is eating.        Relevant Orders   Basic metabolic panel (Completed)   Hypothyroidism    On thyroid replacement.  Follow tsh.       Lower extremity  edema    Improved.  Follow.        Respiratory failure with hypoxia and hypercapnia ([place])    Recently admitted with respiratory failure with hypoxia and hypercapnia.  See note.  Now on BiPAP.  Doing better.  Oxygen level better.   Discussed at length with family today.  They request earlier appt with pulmonary.  Continue BiPAP.  Continue inhaler/neb regimen.         Other Visit Diagnoses    Hypomagnesemia       Relevant Orders   Magnesium (Completed)       [name], MD

## 2017-03-28 NOTE — Telephone Encounter (Signed)
Called l/m to get more information on number of visits and how long needs approved.

## 2017-03-28 NOTE — Progress Notes (Signed)
Pre-visit discussion using our clinic review tool. No additional management support is needed unless otherwise documented below in the visit note.  

## 2017-03-30 MED ORDER — MAGNESIUM OXIDE -MG SUPPLEMENT 400 MG PO CAPS: 400.0000 mg | ORAL_CAPSULE | Freq: Every day | ORAL | 1 refills | Status: DC

## 2017-03-31 ENCOUNTER — Encounter: Payer: Self-pay | Admitting: Internal Medicine

## 2017-03-31 DIAGNOSIS — R410 Disorientation, unspecified: Secondary | ICD-10-CM | POA: Insufficient documentation

## 2017-03-31 NOTE — Assessment & Plan Note (Signed)
Recently admitted with respiratory failure with hypoxia and hypercapnia.  See note.  Now on BiPAP.  Doing better.  Oxygen level better.  Discussed at length with family today.  They request earlier appt with pulmonary.  Continue BiPAP.  Continue inhaler/neb regimen.

## 2017-03-31 NOTE — Assessment & Plan Note (Signed)
On thyroid replacement.  Follow tsh.  

## 2017-03-31 NOTE — Telephone Encounter (Signed)
Spoke with wife and worked pt in for 04/03/17 to see DS. Nothing further needed.

## 2017-03-31 NOTE — Assessment & Plan Note (Signed)
Will check met b and a1c.

## 2017-03-31 NOTE — Assessment & Plan Note (Signed)
Decreased in ER.  Recheck sodium level today.  Follow.  Is eating.

## 2017-03-31 NOTE — Telephone Encounter (Signed)
Left message to return call to our office.  

## 2017-03-31 NOTE — Telephone Encounter (Signed)
Melissa  Calling stating Dr Nicki Reaper asking if we can please move up patient's hospital follow up, she states they are needing to be seen much sooner and with in a week   Please advise.

## 2017-03-31 NOTE — Assessment & Plan Note (Signed)
Blood pressure under good control.  Continue same medication regimen.  Follow pressures.  Follow metabolic panel.   

## 2017-03-31 NOTE — Assessment & Plan Note (Signed)
Improved.  Follow.  

## 2017-03-31 NOTE — Assessment & Plan Note (Signed)
Admitted with confusion and agitation.  Appears to be doing much better with improvement in respiratory status.  Discussed at length with them today.  Continue current inhaler and neb regimen.  Family request f/u with Dr Jannifer Franklin as well (for further evaluation).

## 2017-03-31 NOTE — Assessment & Plan Note (Signed)
Has been followed by Dr Alva Garnet.  On BiPAP now.  Continue inhalers and nebs.  Overall doing better.  Request earlier appt with pulmonary.

## 2017-04-03 ENCOUNTER — Encounter: Payer: Self-pay | Admitting: Pulmonary Disease

## 2017-04-03 ENCOUNTER — Ambulatory Visit (INDEPENDENT_AMBULATORY_CARE_PROVIDER_SITE_OTHER): Payer: Medicare Other | Admitting: Pulmonary Disease

## 2017-04-03 VITALS — BP 116/70 | HR 75 | Ht 64.0 in | Wt 148.0 lb

## 2017-04-03 DIAGNOSIS — J9612 Chronic respiratory failure with hypercapnia: Secondary | ICD-10-CM | POA: Diagnosis not present

## 2017-04-03 DIAGNOSIS — J449 Chronic obstructive pulmonary disease, unspecified: Secondary | ICD-10-CM | POA: Diagnosis not present

## 2017-04-03 MED ORDER — IPRATROPIUM-ALBUTEROL 0.5-2.5 (3) MG/3ML IN SOLN: 3.0000 mL | Freq: Four times a day (QID) | RESPIRATORY_TRACT | 2 refills | Status: DC | PRN

## 2017-04-03 MED ORDER — UMECLIDINIUM-VILANTEROL 62.5-25 MCG/INH IN AEPB: 1.0000 | INHALATION_SPRAY | Freq: Every day | RESPIRATORY_TRACT | 3 refills | Status: DC

## 2017-04-03 MED ORDER — ALBUTEROL SULFATE HFA 108 (90 BASE) MCG/ACT IN AERS: 1.0000 | INHALATION_SPRAY | Freq: Four times a day (QID) | RESPIRATORY_TRACT | 2 refills | Status: DC | PRN

## 2017-04-03 MED ORDER — UMECLIDINIUM-VILANTEROL 62.5-25 MCG/INH IN AEPB: 1.0000 | INHALATION_SPRAY | Freq: Every day | RESPIRATORY_TRACT | 0 refills | Status: DC

## 2017-04-03 MED ORDER — UMECLIDINIUM-VILANTEROL 62.5-25 MCG/INH IN AEPB: 1.0000 | INHALATION_SPRAY | Freq: Every day | RESPIRATORY_TRACT | 5 refills | Status: DC

## 2017-04-03 NOTE — Telephone Encounter (Signed)
Have called and l/m for more information x 3 no answer. Will end message and get information if someone calls back.

## 2017-04-03 NOTE — Patient Instructions (Signed)
Resume Anoro inhaler - one inhalation daily. Refill has been entered Continue albuterol (Proventil) inhaler as needed (first line rescue medication) Change DuoNeb to every 6 hours as needed (second line rescue medication) Continue nocturnal BiPAP Sleep study has been ordered for 04/13/17. I will review the results of this when available and communicate with you the findings  Follow-up with me in 3-4 months

## 2017-04-06 NOTE — Progress Notes (Signed)
PROFILE: Hospitalized 9/29 - 07/06/15 initially with AMS and hyponatremia. Developed progressive hypersomnolence and was found to be profoundly hypercarbic (PaCO2 162 torr!!). Underwent intubation and mechanical ventilation 9/30 - 07/01/15. PCCM was involved in his care during his time in the ICU. It was felt that the hypercarbic resp failure was due to COPD and he was discharged home on bronchodilator therapy with pulmonary follow up. On his initial CXR, there was concern for a RUL nodule which was confirmed by CT chest. Again hospitalized 10/20-10/26/16 for weakness and hyponatremia. Seen in consultation by Dr Stevenson Clinch CXR 07/02/15: NACPD CT chest 07/20/15: RUL "nodule" smaller than previously noted in Sept PET 07/22/15: 1. No specific findings identified to suggest hypermetabolic/FDG avid tumor. Continued resolution of right upper lobe pulmonary nodule which currently measures 1.3 cm and exhibits non malignant range FDG uptake Spirometry 07/30/15: FVC  2.09 73%,  FEV1 1.47 68%, FEV1% 70%   PSG 07/27/15: Moderate OSA (AHI 29) LE venous US 08/02/15: no DVT Hospitalized 03/02-03/04/18 for acute on chronic hypercarbic respiratory failure with hypoxemia. Discharge diagnosis was COPD exacerbation. CPAP compliance 05/08-06/06/18: Usage 29/30 days, > 4 hours 8 days, < 4 hours 21 days  PROBLEMS: COPD - mild obstruction at baseline Chronic hypercarbic respiratory failure Moderate OSA. Treated with CPAP   INTERVAL HISTORY: Hospitalized 06/24-06/28/18 for acute on chronic hypercarbic resp failure. Discharged to home on BiPAP  SUBJ: This is a routine re-eval.  He and his family believe that the initiation of BiPAP has been very beneficial. He is remaining alert during the daytime and feels like his sleep is restful. No new complaints. Denies CP, fever, purulent sputum, hemoptysis and calf tenderness. He has chronic LE edema, unchanged to improved since last visit.  OBJ: Vitals:   04/03/17 1542 04/03/17  1546  BP:  116/70  Pulse:  75  SpO2:  98%  Weight: 67.1 kg (148 lb)   Height: 5\' 4"  (1.626 m)   RA  SpO2 92% on 1 lpm [place]   NAD HEENT WNL No JVD noted BS mildly diminished without wheezes Reg, no M noted Abd soft, NT, +BS Ext 2+ symmetric BLE pitting edema Neuro: diffusely weak, no focal deficits  Outpatient Encounter Prescriptions as of 04/03/2017  Medication Sig  . albuterol (PROAIR HFA) 108 (90 Base) MCG/ACT inhaler Inhale 1-2 puffs into the lungs every 6 (six) hours as needed for wheezing or shortness of breath.  . chlorhexidine (PERIDEX) 0.12 % solution 15 mLs by Mouth Rinse route 2 (two) times daily.  . Cholecalciferol (VITAMIN D3) 50000 units CAPS Take 50,000 Units by mouth once a week.  . finasteride (PROSCAR) 5 MG tablet Take 1 tablet (5 mg total) by mouth daily.  . furosemide (LASIX) 20 MG tablet Take 1 tablet (20 mg total) by mouth daily. (Patient taking differently: Take 40 mg by mouth daily. )  . ipratropium-albuterol (DUONEB) 0.5-2.5 (3) MG/3ML SOLN Take 3 mLs by nebulization every 6 (six) hours as needed.  Marland Kitchen levothyroxine (SYNTHROID, LEVOTHROID) 50 MCG tablet Take 1 tablet (50 mcg total) by mouth daily before breakfast.  . losartan (COZAAR) 50 MG tablet Take 1 tablet (50 mg total) by mouth daily.  . Magnesium Oxide -Mg Supplement 400 MG CAPS Take 400 mg by mouth daily.  . metoprolol tartrate (LOPRESSOR) 25 MG tablet Take 1 tablet (25 mg total) by mouth 2 (two) times daily. (Patient taking differently: Take 25 mg by mouth daily. )  . pantoprazole (PROTONIX) 40 MG tablet Take 1 tablet (40 mg total) by mouth  daily.  . phenol (CHLORASEPTIC) 1.4 % LIQD Use as directed 1 spray in the mouth or throat as needed for throat irritation / pain.  Marland Kitchen saccharomyces boulardii (FLORASTOR) 250 MG capsule Take 250 mg by mouth as needed.   . tamsulosin (FLOMAX) 0.4 MG CAPS capsule Take 1 capsule (0.4 mg total) by mouth daily.  . [DISCONTINUED] albuterol (PROAIR HFA) 108 (90 Base) MCG/ACT  inhaler Inhale 1-2 puffs into the lungs every 6 (six) hours as needed for wheezing or shortness of breath.  . [DISCONTINUED] ipratropium-albuterol (DUONEB) 0.5-2.5 (3) MG/3ML SOLN Take 3 mLs by nebulization every 6 (six) hours.  Marland Kitchen umeclidinium-vilanterol (ANORO ELLIPTA) 62.5-25 MCG/INH AEPB Inhale 1 puff into the lungs daily.  . [DISCONTINUED] predniSONE (STERAPRED UNI-PAK 21 TAB) 10 MG (21) TBPK tablet Start 60 mg oral once daily, taper 10 mg daily until finish  . [DISCONTINUED] umeclidinium-vilanterol (ANORO ELLIPTA) 62.5-25 MCG/INH AEPB Inhale 1 puff into the lungs daily. (Patient not taking: Reported on 04/03/2017)  . [DISCONTINUED] umeclidinium-vilanterol (ANORO ELLIPTA) 62.5-25 MCG/INH AEPB Inhale 1 puff into the lungs daily.  . [DISCONTINUED] umeclidinium-vilanterol (ANORO ELLIPTA) 62.5-25 MCG/INH AEPB Inhale 1 puff into the lungs daily.   No facility-administered encounter medications on file as of 04/03/2017.    DATA: BMP Latest Ref Rng & Units 03/28/2017 03/25/2017 03/21/2017  Glucose 70 - 99 mg/dL 147(H) 178(H) 209(H)  BUN 6 - 23 mg/dL 24(H) 33(H) 32(H)  Creatinine 0.40 - 1.50 mg/dL 1.16 1.25(H) 1.24  Sodium 135 - 145 mEq/L 133(L) 131(L) 131(L)  Potassium 3.5 - 5.1 mEq/L 4.3 4.4 4.2  Chloride 96 - 112 mEq/L 92(L) 93(L) 94(L)  CO2 19 - 32 mEq/L 36(H) 28 31  Calcium 8.4 - 10.5 mg/dL 9.3 8.9 8.5(L)    CBC Latest Ref Rng & Units 03/25/2017 03/21/2017 03/20/2017  WBC 3.8 - 10.6 K/uL 6.4 6.2 6.9  Hemoglobin 13.0 - 18.0 g/dL 12.2(L) 13.2 13.3  Hematocrit 40.0 - 52.0 % 36.8(L) 39.4(L) 40.7  Platelets 150 - 440 K/uL 253 282 290   CXR 03/25/17: Low volumes, no acute findings  IMPRESSION: COPD - mild obstruction at baseline. However he has severe chronic hypercarbic and hypoxemic respiratory failure. The degree of hypercarbia is out of proportion to obstruction on pulmonary function testing.  Chronic hypercarbic and hypoxemic respiratory failure  Moderate obstructive sleep apnea with  difficulty tolerating CPAP  Extreme frailty and debilitation  PLAN: COPD regimen was discussed in detail:  Resume Anoro inhaler - one inhalation daily. Refill has been entered Continue albuterol (Proventil) inhaler as needed (first line rescue medication) Change DuoNeb to every 6 hours as needed (second line rescue medication) Continue nocturnal BiPAP - initiated for recurrent hypercarbic failure. Current settings 10/5 Sleep study has been ordered for 04/13/17. I will review the results of this when available and communicate with you the findings  Follow-up with me in 3-4 months   Merton Border, MD PCCM service Mobile [phone] Pager [phone] 04/06/2017 4:40 AM

## 2017-04-13 ENCOUNTER — Ambulatory Visit: Payer: Medicare Other | Attending: Neurology

## 2017-04-13 DIAGNOSIS — G4733 Obstructive sleep apnea (adult) (pediatric): Secondary | ICD-10-CM | POA: Insufficient documentation

## 2017-04-13 DIAGNOSIS — G4761 Periodic limb movement disorder: Secondary | ICD-10-CM | POA: Insufficient documentation

## 2017-04-13 DIAGNOSIS — G4736 Sleep related hypoventilation in conditions classified elsewhere: Secondary | ICD-10-CM | POA: Diagnosis not present

## 2017-04-14 ENCOUNTER — Encounter: Payer: Self-pay | Admitting: Pulmonary Disease

## 2017-04-14 DIAGNOSIS — J9611 Chronic respiratory failure with hypoxia: Secondary | ICD-10-CM

## 2017-04-14 DIAGNOSIS — J9612 Chronic respiratory failure with hypercapnia: Principal | ICD-10-CM

## 2017-04-14 DIAGNOSIS — G4733 Obstructive sleep apnea (adult) (pediatric): Secondary | ICD-10-CM

## 2017-04-17 ENCOUNTER — Telehealth: Payer: Self-pay | Admitting: Internal Medicine

## 2017-04-17 NOTE — Telephone Encounter (Signed)
Aaron Hendrix from [facility] called and is looking for verbal orders for OT for 2x week for 6 weeks. Please advise, thank you!  Call [place] @ [phone]

## 2017-04-18 NOTE — Telephone Encounter (Signed)
Ok to give orders  °

## 2017-04-18 NOTE — Telephone Encounter (Signed)
ok 

## 2017-04-18 NOTE — Telephone Encounter (Signed)
Left detailed message with verbal ok. She will call back if any questions.

## 2017-04-21 DIAGNOSIS — G4733 Obstructive sleep apnea (adult) (pediatric): Secondary | ICD-10-CM | POA: Diagnosis not present

## 2017-04-24 ENCOUNTER — Telehealth: Payer: Self-pay | Admitting: *Deleted

## 2017-04-24 DIAGNOSIS — J449 Chronic obstructive pulmonary disease, unspecified: Secondary | ICD-10-CM

## 2017-04-24 DIAGNOSIS — G4733 Obstructive sleep apnea (adult) (pediatric): Secondary | ICD-10-CM

## 2017-04-24 NOTE — Telephone Encounter (Signed)
-----   Message from Laverle Hobby, MD sent at 04/21/2017  5:29 PM EDT ----- Regarding: titration study results.   -BiPAP with pressure range of 5 to 10 cm of cmH2O with pressure support of 6 cm H2O.  -2L oxygen with Bipap.

## 2017-04-24 NOTE — Telephone Encounter (Signed)
Spouse aware of titration study results. Orders entered. Nothing further needed.

## 2017-04-25 ENCOUNTER — Telehealth: Payer: Self-pay | Admitting: Pulmonary Disease

## 2017-04-25 NOTE — Telephone Encounter (Signed)
Melissa with [facility] calling stating we placed order for auto bipap  Pt has [place]  She would like to give [place] the settings  IPAP 10 EPAP 5 Backup rate 8  Just wanted to know if patient can stay on this unit and setting

## 2017-04-25 NOTE — Telephone Encounter (Signed)
Dr. Alva Garnet bipap titration was ordered on [name]. A new order has been submitted to change pressures. [facility] is calling wanting to know if patient can remain on on same pressures. Please advise.

## 2017-04-25 NOTE — Telephone Encounter (Signed)
Please read previous message from [facility].  We just placed new orders for Bipap on yesterday.

## 2017-04-25 NOTE — Telephone Encounter (Signed)
ok 

## 2017-04-26 ENCOUNTER — Other Ambulatory Visit: Payer: Self-pay | Admitting: Internal Medicine

## 2017-04-26 NOTE — Telephone Encounter (Signed)
Yes

## 2017-04-26 NOTE — Telephone Encounter (Signed)
Informed Melissa with [facility] to leave pt on same BiPAP settings per DS. Nothing further needed.

## 2017-04-27 ENCOUNTER — Encounter: Payer: Self-pay | Admitting: Pulmonary Disease

## 2017-05-02 ENCOUNTER — Inpatient Hospital Stay: Payer: Medicare Other | Admitting: Pulmonary Disease

## 2017-05-12 ENCOUNTER — Telehealth: Payer: Self-pay | Admitting: Internal Medicine

## 2017-05-12 NOTE — Telephone Encounter (Signed)
Called verbal given to [facility]

## 2017-05-12 NOTE — Telephone Encounter (Signed)
Aaron Hendrix [phone] called from [facility] regarding requesting OT to be restarted by pt for twice a week for four weeks. Verbal order. VM is ok to leave. Thank you!

## 2017-05-14 NOTE — Progress Notes (Signed)
8:53 AM   Aaron Hendrix 04/23/1930 [id number]  Referring provider: [name], [address] [place], [place] [address]  Chief Complaint  Patient presents with  . Benign Prostatic Hypertrophy    6 month follow up  . Nocturia    HPI: Patient is an 83 year old  [place] American male who presents today for a six months follow up.  Previous history Patient was seen during a recent admission for hyponatremia with nocturia and was started on tamsulosin for his moderate PVR's of 200 cc.  He was experiencing urinary frequency, urgency, nocturia, leakage of urine, intermittency, hesitancy, straining to urinate and a weak stream.  He has been diagnosed with sleep apnea and is sleeping with his BiPAP machine.  A median lobe of the prostate was appreciated on a CT scan in 05/2014.  BPH WITH LUTS His IPSS score today is 10, which is moderate lower urinary tract symptomatology.  He is pleased with his quality life due to his urinary symptoms.  His previous IPSS score was 7/1.   His previous PVR was 110 mL.  His complaint(s) today are/is frequency and nocturia.  He denies any dysuria, hematuria or suprapubic pain.  He currently taking tamsulosin 0.4 mg and finasteride 5 mg daily.   He also denies any recent fevers, chills, nausea or vomiting.  He does not have a family history of PCa.  He has seen an improvement in his nocturia.        IPSS    Row Name 05/15/17 1500         International Prostate Symptom Score   How often have you had the sensation of not emptying your bladder? Not at All     How often have you had to urinate less than every two hours? About half the time     How often have you found you stopped and started again several times when you urinated? Not at All     How often have you found it difficult to postpone urination? About half the time     How often have you had a weak urinary stream? Not at All     How often have you had to strain to  start urination? Less than 1 in 5 times     How many times did you typically get up at night to urinate? 3 Times     Total IPSS Score 10       Quality of Life due to urinary symptoms   If you were to spend the rest of your life with your urinary condition just the way it is now how would you feel about that? Pleased        Score:  1-7 Mild 8-19 Moderate 20-35 Severe     PMH: Past Medical History:  Diagnosis Date  . Carpal tunnel syndrome   . COPD with asthma ([place])   . Diabetes mellitus    Diet control   . DJD (degenerative joint disease), cervical   . Duodenal ulcer, with partial obstruction 08/10/2012  . Essential and other specified forms of tremor 12/16/2013  . Hyperlipidemia   . Hypertension    no medicine needed  . IBS (irritable bowel syndrome)   . Internal hemorrhoids   . Memory deficit 12/16/2013  . Numbness and tingling in right hand    started 2 yeas ago  . Peptic ulcer   . Personal history of colonic polyps    adenomatous  . Polyneuropathy in  diabetes(357.2)   . Tremor, essential   . Vitamin D deficiency     Surgical History: Past Surgical History:  Procedure Laterality Date  . ANAL FISSURE REPAIR    . ANKLE SURGERY Right    right- pins placed in  . COLONOSCOPY W/ BIOPSIES AND POLYPECTOMY  8/03, 6/05, 7/09, 9/10   internal hemorrhoids, tubular adenomas, mucosa & lymphoid nodules  . INGUINAL HERNIA REPAIR Right   . TOTAL HIP ARTHROPLASTY Right 11/13/2012   Procedure: TOTAL HIP ARTHROPLASTY ANTERIOR APPROACH;  Surgeon: Mcarthur Rossetti, MD;  Location: [facility];  Service: Orthopedics;  Laterality: Right;  Right total hip arthroplasty  . UPPER GASTROINTESTINAL ENDOSCOPY  3/05, 7/09, 9/10,2013   gastritis, duodenitis    Home Medications:  Allergies as of 05/15/2017      Reactions   Fexofenadine Other (See Comments)   Quinapril Hcl Other (See Comments)   Unknown    Latex Rash      Medication List       Accurate as of 05/15/17 11:59 PM. Always  use your most recent med list.          albuterol 108 (90 Base) MCG/ACT inhaler Commonly known as:  PROAIR HFA Inhale 1-2 puffs into the lungs every 6 (six) hours as needed for wheezing or shortness of breath.   chlorhexidine 0.12 % solution Commonly known as:  PERIDEX 15 mLs by Mouth Rinse route 2 (two) times daily.   finasteride 5 MG tablet Commonly known as:  PROSCAR Take 1 tablet (5 mg total) by mouth daily.   furosemide 20 MG tablet Commonly known as:  LASIX Take 1 tablet (20 mg total) by mouth daily.   ipratropium-albuterol 0.5-2.5 (3) MG/3ML Soln Commonly known as:  DUONEB Take 3 mLs by nebulization every 6 (six) hours as needed.   levothyroxine 50 MCG tablet Commonly known as:  SYNTHROID, LEVOTHROID Take 1 tablet (50 mcg total) by mouth daily before breakfast.   losartan 50 MG tablet Commonly known as:  COZAAR TAKE 1 TABLET BY MOUTH ONCE DAILY   magnesium oxide 400 (241.3 Mg) MG tablet Commonly known as:  MAG-OX TAKE 1 TABLET BY MOUTH ONCE DAILY   metoprolol tartrate 25 MG tablet Commonly known as:  LOPRESSOR Take 1 tablet (25 mg total) by mouth 2 (two) times daily.   pantoprazole 40 MG tablet Commonly known as:  PROTONIX Take 1 tablet (40 mg total) by mouth daily.   phenol 1.4 % Liqd Commonly known as:  CHLORASEPTIC Use as directed 1 spray in the mouth or throat as needed for throat irritation / pain.   saccharomyces boulardii 250 MG capsule Commonly known as:  FLORASTOR Take 250 mg by mouth as needed.   tamsulosin 0.4 MG Caps capsule Commonly known as:  FLOMAX Take 1 capsule (0.4 mg total) by mouth daily.   umeclidinium-vilanterol 62.5-25 MCG/INH Aepb Commonly known as:  ANORO ELLIPTA Inhale 1 puff into the lungs daily.   Vitamin D3 50000 units Caps Take 50,000 Units by mouth once a week.       Allergies:  Allergies  Allergen Reactions  . Fexofenadine Other (See Comments)  . Quinapril Hcl Other (See Comments)    Unknown   . Latex Rash     Family History: Family History  Problem Relation Age of Onset  . Thyroid cancer Daughter 62  . Diabetes Son   . Urolithiasis Son   . Leukemia Maternal Aunt   . Colon cancer Neg Hx   . Esophageal cancer Neg Hx   .  Rectal cancer Neg Hx   . Stomach cancer Neg Hx   . Prostate cancer Neg Hx   . Kidney disease Neg Hx   . Kidney cancer Neg Hx   . Bladder Cancer Neg Hx     Social History:  reports that he quit smoking about 53 years ago. His smoking use included Cigarettes. He has never used smokeless tobacco. He reports that he does not drink alcohol or use drugs.  ROS: UROLOGY Frequent Urination?: Yes Hard to postpone urination?: No Burning/pain with urination?: No Get up at night to urinate?: Yes Leakage of urine?: No Urine stream starts and stops?: No Trouble starting stream?: No Do you have to strain to urinate?: No Blood in urine?: No Urinary tract infection?: No Sexually transmitted disease?: No Injury to kidneys or bladder?: No Painful intercourse?: No Weak stream?: No Erection problems?: No Penile pain?: No  Gastrointestinal Nausea?: No Vomiting?: No Indigestion/heartburn?: No Diarrhea?: No Constipation?: No  Constitutional Fever: No Night sweats?: No Weight loss?: No Fatigue?: No  Skin Skin rash/lesions?: No Itching?: No  Eyes Blurred vision?: No Double vision?: No  Ears/Nose/Throat Sore throat?: No Sinus problems?: No  Hematologic/Lymphatic Swollen glands?: No Easy bruising?: No  Cardiovascular Leg swelling?: No Chest pain?: No  Respiratory Cough?: No Shortness of breath?: No  Endocrine Excessive thirst?: No  Musculoskeletal Back pain?: No Joint pain?: No  Neurological Headaches?: No Dizziness?: No  Psychologic Depression?: No Anxiety?: No  Physical Exam: BP 126/69   Pulse 86   Ht 5\' 4"  (1.626 m)   Wt 143 lb 6.4 oz (65 kg)   BMI 24.61 kg/m   Constitutional: Well nourished. Alert and oriented, No acute  distress. HEENT: [place] AT, moist mucus membranes. Trachea midline, no masses. Cardiovascular: No clubbing, cyanosis, or edema. Respiratory: Normal respiratory effort, no increased work of breathing. GI: Abdomen is soft, non tender, non distended, no abdominal masses. Liver and spleen not palpable.  No hernias appreciated.  Stool sample for occult testing is not indicated.   GU: No CVA tenderness.  No bladder fullness or masses.  Patient with circumcised phallus.   Urethral meatus is patent.  No penile discharge. No penile lesions or rashes. Scrotum without lesions, cysts, rashes and/or edema.  Testicles are located scrotally bilaterally. No masses are appreciated in the testicles. Left and right epididymis are normal. Rectal: Patient with  normal sphincter tone. Anus and perineum without scarring or rashes. No rectal masses are appreciated. Prostate is approximately 45 grams, no nodules are appreciated. Seminal vesicles are normal. Skin: No rashes, bruises or suspicious lesions. Lymph: No cervical or inguinal adenopathy. Neurologic: Grossly intact, no focal deficits, moving all 4 extremities. Psychiatric: Normal mood and affect.   Laboratory Data: Lab Results  Component Value Date   WBC 6.4 03/25/2017   HGB 12.2 (L) 03/25/2017   HCT 36.8 (L) 03/25/2017   MCV 91.9 03/25/2017   PLT 253 03/25/2017   Lab Results  Component Value Date   CREATININE 1.16 03/28/2017   Lab Results  Component Value Date   PSA 0.83 02/06/2015   Lab Results  Component Value Date   HGBA1C 6.5 03/28/2017    Results for orders placed or performed in visit on 03/28/17  Hemoglobin A1c  Result Value Ref Range   Hgb A1c MFr Bld 6.5 4.6 - 6.5 %  Basic metabolic panel  Result Value Ref Range   Sodium 133 (L) 135 - 145 mEq/L   Potassium 4.3 3.5 - 5.1 mEq/L   Chloride 92 (L)  96 - 112 mEq/L   CO2 36 (H) 19 - 32 mEq/L   Glucose, Bld 147 (H) 70 - 99 mg/dL   BUN 24 (H) 6 - 23 mg/dL   Creatinine, Ser 1.16 0.40 -  1.50 mg/dL   Calcium 9.3 8.4 - 10.5 mg/dL   GFR 77.03 >60.00 mL/min  Magnesium  Result Value Ref Range   Magnesium 1.6 1.5 - 2.5 mg/dL   I have reviewed the labs  Assessment & Plan:    1. Nocturia:   Patient has found relief with his CPAP machine.  He is still sleeping with BiPAP machine. He'll return in 6 months for I PSS.  2. BPH with LUTS  - IPSS score is 10/1, it is worse  - Continue conservative management, avoiding bladder irritants and timed voiding's  - Continue finasteride 5 mg daily and tamsulosin 0.4 mg  - RTC in 6 months for IPSS and exam   3. SUI  - not bothersome to the patient at this time  Return in about 6 months (around 11/15/2017) for I PSS and exam.  Zara Council, [facility]  [place] [address], [place], [place] [address] [phone]

## 2017-05-15 ENCOUNTER — Encounter: Payer: Self-pay | Admitting: Urology

## 2017-05-15 ENCOUNTER — Ambulatory Visit (INDEPENDENT_AMBULATORY_CARE_PROVIDER_SITE_OTHER): Payer: Medicare Other | Admitting: Urology

## 2017-05-15 VITALS — BP 126/69 | HR 86 | Ht 64.0 in | Wt 143.4 lb

## 2017-05-15 DIAGNOSIS — N138 Other obstructive and reflux uropathy: Secondary | ICD-10-CM | POA: Diagnosis not present

## 2017-05-15 DIAGNOSIS — N401 Enlarged prostate with lower urinary tract symptoms: Secondary | ICD-10-CM | POA: Diagnosis not present

## 2017-05-15 DIAGNOSIS — R351 Nocturia: Secondary | ICD-10-CM

## 2017-05-15 DIAGNOSIS — N393 Stress incontinence (female) (male): Secondary | ICD-10-CM | POA: Diagnosis not present

## 2017-05-24 ENCOUNTER — Telehealth: Payer: Self-pay | Admitting: Internal Medicine

## 2017-05-24 MED ORDER — MAGNESIUM OXIDE 400 (241.3 MG) MG PO TABS: 1.0000 | ORAL_TABLET | Freq: Every day | ORAL | 3 refills | Status: DC

## 2017-05-24 NOTE — Telephone Encounter (Signed)
rx refilled. Patients wife is aware

## 2017-05-24 NOTE — Telephone Encounter (Signed)
Pt wife called wanting to know if Dr Nicki Reaper wants to refill pt medication of   magnesium oxide (MAG-OX) 400 (241.3 Mg) MG tablet   Or pt get labs done first?  Pharmacy is [facility], [place].  Call pt @ [phone]. Thank you!

## 2017-05-30 ENCOUNTER — Other Ambulatory Visit: Payer: Self-pay | Admitting: Internal Medicine

## 2017-06-05 ENCOUNTER — Encounter: Payer: Self-pay | Admitting: Podiatry

## 2017-06-05 ENCOUNTER — Ambulatory Visit (INDEPENDENT_AMBULATORY_CARE_PROVIDER_SITE_OTHER): Payer: Medicare Other | Admitting: Podiatry

## 2017-06-05 DIAGNOSIS — B351 Tinea unguium: Secondary | ICD-10-CM | POA: Diagnosis not present

## 2017-06-05 DIAGNOSIS — E119 Type 2 diabetes mellitus without complications: Secondary | ICD-10-CM

## 2017-06-05 DIAGNOSIS — M79609 Pain in unspecified limb: Secondary | ICD-10-CM

## 2017-06-05 NOTE — Progress Notes (Signed)
Complaint:  Visit Type: Patient returns to my office for continued preventative foot care services. Complaint: Patient states" my nails have grown long and thick and become painful to walk and wear shoes" . The patient presents for preventative foot care services. No changes to ROS  Podiatric Exam: Vascular: dorsalis pedis and posterior tibial pulses are palpable bilateral. Capillary return is immediate. Temperature gradient is WNL. Skin turgor WNL  Sensorium: Normal Semmes Weinstein monofilament test. Normal tactile sensation bilaterally. Nail Exam: Pt has thick disfigured discolored nails with subungual debris noted bilateral entire nail hallux through fifth toenails Ulcer Exam: There is no evidence of ulcer or pre-ulcerative changes or infection. Orthopedic Exam: Muscle tone and strength are WNL. No limitations in general ROM. No crepitus or effusions noted. Foot type and digits show no abnormalities. Bony prominences are unremarkable. Skin: No Porokeratosis. No infection or ulcers  Diagnosis:  Onychomycosis, , Pain in right toe, pain in left toes  Treatment & Plan Procedures and Treatment: Consent by patient was obtained for treatment procedures. The patient understood the discussion of treatment and procedures well. All questions were answered thoroughly reviewed. Debridement of mycotic and hypertrophic toenails, 1 through 5 bilateral and clearing of subungual debris. No ulceration, no infection noted.  Return Visit-Office Procedure: Patient instructed to return to the office for a follow up visit 10 weeks  for continued evaluation and treatment.    Gardiner Barefoot DPM

## 2017-06-06 ENCOUNTER — Encounter: Payer: Self-pay | Admitting: Neurology

## 2017-06-06 ENCOUNTER — Ambulatory Visit (INDEPENDENT_AMBULATORY_CARE_PROVIDER_SITE_OTHER): Payer: Medicare Other | Admitting: Neurology

## 2017-06-06 VITALS — BP 126/54 | HR 82 | Ht 64.0 in | Wt 145.0 lb

## 2017-06-06 DIAGNOSIS — R413 Other amnesia: Secondary | ICD-10-CM

## 2017-06-06 DIAGNOSIS — G9341 Metabolic encephalopathy: Secondary | ICD-10-CM | POA: Diagnosis not present

## 2017-06-06 NOTE — Progress Notes (Signed)
Reason for visit:  Memory disturbance  Referring physician: [facility] Aaron Hendrix is a 83 y.o. male  History of present illness:   [name] an 83 year old right-handed black male with a history of COPD. The patient was recently in the hospital on 03/19/2017. The patient was noted to have confusion associated with hypercapnia. He was switched from CPAP to BiPAP and this seemed to make a significant improvement in his altered mental status. The patient was seen through this office over 3 years ago for a gradually progressive memory disturbance. He was lost to follow-up. He has never gone on any medications for memory. He has undergone a recent CT scan of the brain while in the hospital that showed some small vessel disease, no acute changes were seen. The patient comes in today with his wife, she indicates that the memory issues and confusion have significantly improved while on BiPAP. The patient has been able to maintain his weight. The patient does not operate a motor vehicle, he does have some short-term memory issues, he does not have any problems remembering names for people or problems with word finding. He requires assistance with keeping up with medications and appointments. He walks with a cane, he reports no new numbness or weakness of the face, arms, or legs. He is not on oxygen therapy at night. The patient is sent to this office for further evaluation.  Past Medical History:  Diagnosis Date  . Carpal tunnel syndrome   . COPD with asthma ([place])   . Diabetes mellitus    Diet control   . DJD (degenerative joint disease), cervical   . Duodenal ulcer, with partial obstruction 08/10/2012  . Essential and other specified forms of tremor 12/16/2013  . Hyperlipidemia   . Hypertension    no medicine needed  . IBS (irritable bowel syndrome)   . Internal hemorrhoids   . Memory deficit 12/16/2013  . Numbness and tingling in right hand    started 2 yeas ago  . Peptic ulcer   .  Personal history of colonic polyps    adenomatous  . Polyneuropathy in diabetes(357.2)   . Tremor, essential   . Vitamin D deficiency     Past Surgical History:  Procedure Laterality Date  . ANAL FISSURE REPAIR    . ANKLE SURGERY Right    right- pins placed in  . COLONOSCOPY W/ BIOPSIES AND POLYPECTOMY  8/03, 6/05, 7/09, 9/10   internal hemorrhoids, tubular adenomas, mucosa & lymphoid nodules  . INGUINAL HERNIA REPAIR Right   . TOTAL HIP ARTHROPLASTY Right 11/13/2012   Procedure: TOTAL HIP ARTHROPLASTY ANTERIOR APPROACH;  Surgeon: Mcarthur Rossetti, MD;  Location: [facility];  Service: Orthopedics;  Laterality: Right;  Right total hip arthroplasty  . UPPER GASTROINTESTINAL ENDOSCOPY  3/05, 7/09, 9/10,2013   gastritis, duodenitis    Family History  Problem Relation Age of Onset  . Thyroid cancer Daughter 67  . Diabetes Son   . Urolithiasis Son   . Leukemia Maternal Aunt   . Colon cancer Neg Hx   . Esophageal cancer Neg Hx   . Rectal cancer Neg Hx   . Stomach cancer Neg Hx   . Prostate cancer Neg Hx   . Kidney disease Neg Hx   . Kidney cancer Neg Hx   . Bladder Cancer Neg Hx     Social history:  reports that he quit smoking about 53 years ago. His smoking use included Cigarettes. He has never used smokeless tobacco. He reports  that he does not drink alcohol or use drugs.  Medications:  Prior to Admission medications   Medication Sig Start Date End Date Taking? Authorizing Provider  albuterol (PROAIR HFA) 108 (90 Base) MCG/ACT inhaler Inhale 1-2 puffs into the lungs every 6 (six) hours as needed for wheezing or shortness of breath. 04/03/17  Yes [name], MD  Cholecalciferol (VITAMIN D3) 50000 units CAPS Take 50,000 Units by mouth once a week.   Yes [provider]  finasteride (PROSCAR) 5 MG tablet Take 1 tablet (5 mg total) by mouth daily. 11/14/16  Yes [name], [name], PA-C  furosemide (LASIX) 20 MG tablet Take 1 tablet (20 mg total) by mouth  daily. Patient taking differently: Take 40 mg by mouth daily.  07/22/15  Yes Max Sane, MD  levothyroxine (SYNTHROID, LEVOTHROID) 50 MCG tablet Take 1 tablet (50 mcg total) by mouth daily before breakfast. 12/02/16  Yes [name], MD  losartan (COZAAR) 50 MG tablet TAKE 1 TABLET BY MOUTH ONCE DAILY 04/26/17  Yes [name], MD  magnesium oxide (MAG-OX) 400 (241.3 Mg) MG tablet Take 1 tablet (400 mg total) by mouth daily. 05/24/17  Yes [name], MD  pantoprazole (PROTONIX) 40 MG tablet TAKE 1 TABLET BY MOUTH ONCE DAILY 05/30/17  Yes [name], MD  saccharomyces boulardii (FLORASTOR) 250 MG capsule Take 250 mg by mouth as needed.    Yes [provider]  tamsulosin (FLOMAX) 0.4 MG CAPS capsule Take 1 capsule (0.4 mg total) by mouth daily. 11/14/16  Yes [name], [name], PA-C  umeclidinium-vilanterol (ANORO ELLIPTA) 62.5-25 MCG/INH AEPB Inhale 1 puff into the lungs daily. 04/03/17  Yes [name], MD      Allergies  Allergen Reactions  . Fexofenadine Other (See Comments)  . Quinapril Hcl Other (See Comments)    Unknown   . Latex Rash    ROS:  Out of a complete 14 system review of symptoms, the patient complains only of the following symptoms, and all other reviewed systems are negative.  Memory problems, confusion Shortness of breath  Blood pressure (!) 126/54, pulse 82, height 5\' 4"  (1.626 m), weight 145 lb (65.8 kg), SpO2 96 %.  Physical Exam  General: The patient is slightly sleepy, but will cooperate during the examination.  Eyes: Pupils are equal, round, and reactive to light. Discs are flat bilaterally.  Neck: The neck is supple, no carotid bruits are noted.  Respiratory: The respiratory examination is clear.  Cardiovascular: The cardiovascular examination reveals a regular rate and rhythm, no obvious murmurs or rubs are noted.  Skin: Extremities are without significant edema.  Neurologic Exam  Mental status: The patient is alert and  oriented x 3 at the time of the examination. The Mini-Mental Status Examination done today shows a total score of 21/30.  Cranial nerves: Facial symmetry is present. There is good sensation of the face to pinprick and soft touch bilaterally. The strength of the facial muscles and the muscles to head turning and shoulder shrug are normal bilaterally. Speech is well enunciated, no aphasia or dysarthria is noted. Extraocular movements are full. Visual fields are full. The tongue is midline, and the patient has symmetric elevation of the soft palate. No obvious hearing deficits are noted.  Motor: The motor testing reveals 5 over 5 strength of all 4 extremities. Good symmetric motor tone is noted throughout.  Sensory: Sensory testing is intact to pinprick, soft touch, vibration sensation, and position sense on all 4 extremities. No evidence of extinction  is noted.  Coordination: Cerebellar testing reveals good finger-nose-finger and heel-to-shin bilaterally. No asterixis is seen.  Gait and station: Gait is wide-based, slightly unsteady. The patient uses a cane for ambulation. Tandem gait was not attempted. Romberg is negative. No drift is seen.  Reflexes: Deep tendon reflexes are symmetric, but are depressed bilaterally. Toes are downgoing bilaterally.   CT head 03/08/17:  IMPRESSION: Chronic atrophic and ischemic changes without acute abnormality.  * CT scan images were reviewed online. I agree with the written report.    Assessment/Plan:  1. Progressive memory disorder  2. Recent hypercapnic encephalopathy  The patient has had a very definite improvement in his confusion with the use of BiPAP. The episodes of confusion were correlated with elevated CO2 levels. The patient does have a baseline dementia, the patient and wife do not wish to start medications such as Aricept or Namenda at this time. We will follow-up in 6 months, we will follow the memory issues over time.  Jill Alexanders  MD 06/06/2017 11:42 AM  [facility] [address] [place], [place] [address]  Phone [phone] Fax [phone]

## 2017-06-07 ENCOUNTER — Encounter: Payer: Self-pay | Admitting: Internal Medicine

## 2017-06-07 ENCOUNTER — Ambulatory Visit (INDEPENDENT_AMBULATORY_CARE_PROVIDER_SITE_OTHER): Payer: Medicare Other | Admitting: Internal Medicine

## 2017-06-07 DIAGNOSIS — J9612 Chronic respiratory failure with hypercapnia: Secondary | ICD-10-CM

## 2017-06-07 DIAGNOSIS — J9611 Chronic respiratory failure with hypoxia: Secondary | ICD-10-CM | POA: Diagnosis not present

## 2017-06-07 DIAGNOSIS — R413 Other amnesia: Secondary | ICD-10-CM

## 2017-06-07 DIAGNOSIS — E039 Hypothyroidism, unspecified: Secondary | ICD-10-CM | POA: Diagnosis not present

## 2017-06-07 DIAGNOSIS — E119 Type 2 diabetes mellitus without complications: Secondary | ICD-10-CM

## 2017-06-07 DIAGNOSIS — J441 Chronic obstructive pulmonary disease with (acute) exacerbation: Secondary | ICD-10-CM | POA: Diagnosis not present

## 2017-06-07 DIAGNOSIS — Z23 Encounter for immunization: Secondary | ICD-10-CM | POA: Diagnosis not present

## 2017-06-07 DIAGNOSIS — I10 Essential (primary) hypertension: Secondary | ICD-10-CM | POA: Diagnosis not present

## 2017-06-07 DIAGNOSIS — E871 Hypo-osmolality and hyponatremia: Secondary | ICD-10-CM

## 2017-06-07 NOTE — Progress Notes (Signed)
[name], male   DOB: 08-Dec-1929, 83 y.o.   MRN: [id number]   Subjective:    [name], male    DOB: 1930-08-03, 83 y.o.   MRN: [id number]  HPI  Patient here for a scheduled follow up.  He is accompanied by his wife.  History obtained from both of them.  Using BiPAP now.  Using regularly.  Feels better.  Oxygen better.  States wears 7-10 hours per night.  Eating.  No nausea or vomiting.  Bowels moving.  Feels better.  No chest pain.  No abdominal pain.  Blood pressure averaging 108-112/60.  Has f/u planned with nephrology on Monday.     Past Medical History:  Diagnosis Date  . Carpal tunnel syndrome   . COPD with asthma ([place])   . Diabetes mellitus    Diet control   . DJD (degenerative joint disease), cervical   . Duodenal ulcer, with partial obstruction 08/10/2012  . Essential and other specified forms of tremor 12/16/2013  . Hyperlipidemia   . Hypertension    no medicine needed  . IBS (irritable bowel syndrome)   . Internal hemorrhoids   . Memory deficit 12/16/2013  . Numbness and tingling in right hand    started 2 yeas ago  . Peptic ulcer   . Personal history of colonic polyps    adenomatous  . Polyneuropathy in diabetes(357.2)   . Tremor, essential   . Vitamin D deficiency    Past Surgical History:  Procedure Laterality Date  . ANAL FISSURE REPAIR    . ANKLE SURGERY Right    right- pins placed in  . COLONOSCOPY W/ BIOPSIES AND POLYPECTOMY  8/03, 6/05, 7/09, 9/10   internal hemorrhoids, tubular adenomas, mucosa & lymphoid nodules  . INGUINAL HERNIA REPAIR Right   . TOTAL HIP ARTHROPLASTY Right 11/13/2012   Procedure: TOTAL HIP ARTHROPLASTY ANTERIOR APPROACH;  Surgeon: Mcarthur Rossetti, MD;  Location: [facility];  Service: Orthopedics;  Laterality: Right;  Right total hip arthroplasty  . UPPER GASTROINTESTINAL ENDOSCOPY  3/05, 7/09, 9/10,2013   gastritis, duodenitis   Family History  Problem Relation Age of Onset  . Thyroid  cancer Daughter 85  . Diabetes Son   . Urolithiasis Son   . Leukemia Maternal Aunt   . Colon cancer Neg Hx   . Esophageal cancer Neg Hx   . Rectal cancer Neg Hx   . Stomach cancer Neg Hx   . Prostate cancer Neg Hx   . Kidney disease Neg Hx   . Kidney cancer Neg Hx   . Bladder Cancer Neg Hx    Social History   Social History  . Marital status: Married    Spouse name: N/A  . Number of children: 3  . Years of education: MA   Occupational History  . 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 Smoking status: Former Smoker    Types: Cigarettes    Quit date: 07/02/1963  . Smokeless tobacco: Never Used  . Alcohol use No  . Drug use: No  . Sexual activity: Not Asked   Other Topics Concern  . None   Social History Narrative   Lives with wife    Caffeine use: 1 cup coffee per day   Right handed    Veteran [place] [employer]    Outpatient Encounter Prescriptions as of 06/07/2017  Medication Sig  . albuterol (PROAIR HFA) 108 (90 Base) MCG/ACT inhaler Inhale  1-2 puffs into the lungs every 6 (six) hours as needed for wheezing or shortness of breath.  . Cholecalciferol (VITAMIN D3) 50000 units CAPS Take 50,000 Units by mouth once a week.  . finasteride (PROSCAR) 5 MG tablet Take 1 tablet (5 mg total) by mouth daily.  . furosemide (LASIX) 20 MG tablet Take 1 tablet (20 mg total) by mouth daily. (Patient taking differently: Take 40 mg by mouth daily. )  . levothyroxine (SYNTHROID, LEVOTHROID) 50 MCG tablet Take 1 tablet (50 mcg total) by mouth daily before breakfast.  . losartan (COZAAR) 50 MG tablet TAKE 1 TABLET BY MOUTH ONCE DAILY  . magnesium oxide (MAG-OX) 400 (241.3 Mg) MG tablet Take 1 tablet (400 mg total) by mouth daily.  . pantoprazole (PROTONIX) 40 MG tablet TAKE 1 TABLET BY MOUTH ONCE DAILY  . saccharomyces boulardii (FLORASTOR) 250 MG capsule Take 250 mg by mouth as needed.   . tamsulosin (FLOMAX) 0.4 MG CAPS capsule Take 1 capsule (0.4 mg total) by mouth  daily.  Marland Kitchen umeclidinium-vilanterol (ANORO ELLIPTA) 62.5-25 MCG/INH AEPB Inhale 1 puff into the lungs daily.   No facility-administered encounter medications on file as of 06/07/2017.     Review of Systems  Constitutional: Negative for appetite change and unexpected weight change.  HENT: Negative for congestion and sinus pressure.   Respiratory: Negative for cough and chest tightness.        Breathing better.   Cardiovascular: Negative for chest pain, palpitations and leg swelling.  Gastrointestinal: Negative for abdominal pain, diarrhea, nausea and vomiting.  Genitourinary: Negative for difficulty urinating and dysuria.  Musculoskeletal: Negative for back pain and joint swelling.  Skin: Negative for color change and rash.  Neurological: Negative for dizziness, light-headedness and headaches.  Psychiatric/Behavioral: Negative for agitation and dysphoric mood.       Objective:    Physical Exam  Constitutional: He appears well-developed and well-nourished. No distress.  HENT:  Nose: Nose normal.  Mouth/Throat: Oropharynx is clear and moist.  Neck: Neck supple.  Cardiovascular: Normal rate and regular rhythm.   Pulmonary/Chest: Effort normal and breath sounds normal. No respiratory distress.  Abdominal: Soft. Bowel sounds are normal. There is no tenderness.  Musculoskeletal: He exhibits no edema or tenderness.  Lymphadenopathy:    He has no cervical adenopathy.  Skin: No rash noted. No erythema.  Psychiatric: He has a normal mood and affect. His behavior is normal.    BP 122/69 (BP Location: Left Arm, Patient Position: Sitting, Cuff Size: Normal)   Pulse 67   Temp 98.2 F (36.8 C) (Oral)   Resp 14   Ht 5' 4"  (1.626 m)   Wt 144 lb 6.4 oz (65.5 kg)   SpO2 96%   BMI 24.79 kg/m  Wt Readings from Last 3 Encounters:  06/07/17 144 lb 6.4 oz (65.5 kg)  06/06/17 145 lb (65.8 kg)  05/15/17 143 lb 6.4 oz (65 kg)     Lab Results  Component Value Date   WBC 6.4 03/25/2017    HGB 12.2 (L) 03/25/2017   HCT 36.8 (L) 03/25/2017   PLT 253 03/25/2017   GLUCOSE 147 (H) 03/28/2017   CHOL 223 (H) 06/24/2015   TRIG 53.0 06/24/2015   HDL 96.10 06/24/2015   LDLCALC 116 (H) 06/24/2015   ALT 12 (L) 03/25/2017   AST 22 03/25/2017   NA 133 (L) 03/28/2017   K 4.3 03/28/2017   CL 92 (L) 03/28/2017   CREATININE 1.16 03/28/2017   BUN 24 (H) 03/28/2017   CO2  36 (H) 03/28/2017   TSH 1.38 04/21/2016   PSA 0.83 02/06/2015   INR 0.94 11/06/2012   HGBA1C 6.5 03/28/2017   MICROALBUR 93.8 (H) 06/24/2015    Dg Chest Portable 1 View  Result Date: 03/25/2017 CLINICAL DATA:  Altered mental status and dyspnea today. Hospital discharge yesterday. EXAM: PORTABLE CHEST 1 VIEW COMPARISON:  03/19/2017 FINDINGS: Shallow inspiration. The lungs are clear. The pulmonary vasculature is normal. There is no pleural effusion. Heart size is normal. Hilar and mediastinal contours are unremarkable and unchanged. IMPRESSION: No acute findings Electronically Signed   By: Andreas Newport M.D.   On: 03/25/2017 01:51       Assessment & Plan:   Problem List Items Addressed This Visit    COPD (chronic obstructive pulmonary disease) ([place])    Followed by Dr Alva Garnet.  On BiPAP now.  Doing better.  Feels better.  Using regularly.  Oxygen better.        Diabetes mellitus, type II ([place])    Follow met b and a1c.        Hypertension    Blood pressure under good control.  Continue same medication regimen.  Follow pressures.  Follow metabolic panel.        Hyponatremia    Eating better.  Feels better.  Recheck sodium.  He sees nephrology next week.  Will recheck then.        Hypothyroidism    On thyroid replacement.  Follow tsh.        Memory deficit (Chronic)    Improved with treatment of his hypercapnia. Saw Dr Jannifer Franklin recently.  Stable.        Respiratory failure with hypoxia and hypercapnia ([facility])    Using bipap now.  Uses regularly.  Uses 7-10 hours per night.  Feels better.  Oxygen better.   Continue f/u with pulmonary.         Other Visit Diagnoses    Encounter for immunization       Relevant Orders   Flu vaccine HIGH DOSE PF (Completed)       [name], MD

## 2017-06-09 ENCOUNTER — Encounter: Payer: Self-pay | Admitting: Internal Medicine

## 2017-06-09 NOTE — Assessment & Plan Note (Signed)
Using bipap now.  Uses regularly.  Uses 7-10 hours per night.  Feels better.  Oxygen better.  Continue f/u with pulmonary.

## 2017-06-09 NOTE — Assessment & Plan Note (Signed)
Eating better.  Feels better.  Recheck sodium.  He sees nephrology next week.  Will recheck then.

## 2017-06-09 NOTE — Assessment & Plan Note (Signed)
Blood pressure under good control.  Continue same medication regimen.  Follow pressures.  Follow metabolic panel.   

## 2017-06-09 NOTE — Assessment & Plan Note (Signed)
Follow met b and a1c.

## 2017-06-09 NOTE — Assessment & Plan Note (Signed)
Followed by Dr Alva Garnet.  On BiPAP now.  Doing better.  Feels better.  Using regularly.  Oxygen better.

## 2017-06-09 NOTE — Assessment & Plan Note (Signed)
On thyroid replacement.  Follow tsh.  

## 2017-06-09 NOTE — Assessment & Plan Note (Signed)
Improved with treatment of his hypercapnia. Saw Dr Jannifer Franklin recently.  Stable.

## 2017-06-12 ENCOUNTER — Telehealth: Payer: Self-pay

## 2017-06-12 NOTE — Telephone Encounter (Signed)
-----   Message from [name], MD sent at 06/09/2017  7:04 PM EDT ----- Regarding: BiPAP Pts wife Koron Godeaux notified me that they needed documentation sent that he was using BiPAP.  This is in my last office note.  Can forward.  She was also going to talk to pulmonary and see if they had to send.  The contact phone number she gave me is [phone]. Can send my note if needed.  Let me know if any questions or problems.    Dr Nicki Reaper

## 2017-06-12 NOTE — Telephone Encounter (Signed)
Called number was on hold for over 32min and did not get to talk to anyone.

## 2017-06-13 NOTE — Telephone Encounter (Signed)
Called office faxed notes to [phone]

## 2017-06-20 ENCOUNTER — Telehealth: Payer: Self-pay | Admitting: Internal Medicine

## 2017-06-20 NOTE — Telephone Encounter (Signed)
Reviewed med list.  On vitamin D 50,000 units per week.  Prescribed by Dr Forde Dandy.  Per [employer] phone note, contacted Dr Baldwin Crown office.  Has f/u appt on 07/10/17.  They will recheck vitamin D level then and fax to [place].

## 2017-07-03 ENCOUNTER — Ambulatory Visit (INDEPENDENT_AMBULATORY_CARE_PROVIDER_SITE_OTHER): Payer: Medicare Other | Admitting: Pulmonary Disease

## 2017-07-03 ENCOUNTER — Encounter: Payer: Self-pay | Admitting: Pulmonary Disease

## 2017-07-03 VITALS — BP 122/68 | HR 103 | Resp 16 | Ht 64.0 in | Wt 140.0 lb

## 2017-07-03 DIAGNOSIS — J449 Chronic obstructive pulmonary disease, unspecified: Secondary | ICD-10-CM | POA: Diagnosis not present

## 2017-07-03 DIAGNOSIS — G4733 Obstructive sleep apnea (adult) (pediatric): Secondary | ICD-10-CM | POA: Diagnosis not present

## 2017-07-03 DIAGNOSIS — J9612 Chronic respiratory failure with hypercapnia: Secondary | ICD-10-CM

## 2017-07-03 NOTE — Patient Instructions (Signed)
Continue current medications for COPD  Continue BiPAP at night  Follow-up in 6 months or sooner as needed

## 2017-07-03 NOTE — Progress Notes (Signed)
PROFILE: Hospitalized 9/29 - 07/06/15 initially with AMS and hyponatremia. Developed progressive hypersomnolence and was found to be profoundly hypercarbic (PaCO2 162 torr!!). Underwent intubation and mechanical ventilation 9/30 - 07/01/15. PCCM was involved in his care during his time in the ICU. It was felt that the hypercarbic resp failure was due to COPD and he was discharged home on bronchodilator therapy with pulmonary follow up. On his initial CXR, there was concern for a RUL nodule which was confirmed by CT chest. Again hospitalized 10/20-10/26/16 for weakness and hyponatremia. Seen in consultation by Dr Stevenson Clinch CXR 07/02/15: NACPD CT chest 07/20/15: RUL "nodule" smaller than previously noted in Sept PET 07/22/15: 1. No specific findings identified to suggest hypermetabolic/FDG avid tumor. Continued resolution of right upper lobe pulmonary nodule which currently measures 1.3 cm and exhibits non malignant range FDG uptake Spirometry 07/30/15: FVC  2.09 73%,  FEV1 1.47 68%, FEV1% 70%   PSG 07/27/15: Moderate OSA (AHI 29) LE venous US 08/02/15: no DVT Hospitalized 03/02-03/04/18 for acute on chronic hypercarbic respiratory failure with hypoxemia. Discharge diagnosis was COPD exacerbation. CPAP compliance 05/08-06/06/18: Usage 29/30 days, > 4 hours 8 days, < 4 hours 21 days Sleep study 04/14/17: Bilevel trial - recommended 10/5 with backup rate of 8 and 2 LPM O2 bleed in BiPAP compliance: 30/30 days, > 4 hrs 30/30 days. Median AHI 8.2  PROBLEMS: COPD - mild obstruction at baseline Chronic hypercarbic respiratory failure Moderate OSA. Treated with CPAP   INTERVAL HISTORY: No major events  SUBJ: This is a routine re-eval. Both he and his wife report that he is much improved since initiating BiPAP. He is remaining alert during the daytime and feels like his sleep is restful. No new complaints. Denies CP, fever, purulent sputum, hemoptysis and calf tenderness. He has chronic LE edema, unchanged  to improved since last visit.  OBJ: Vitals:   07/03/17 0959 07/03/17 1000  BP:  122/68  Pulse:  (!) 103  Resp: 16   SpO2:  97%  Weight: 63.5 kg (140 lb)   Height: 5\' 4"  (1.626 m)   RA  SpO2 92% on 1 lpm St.    NAD HEENT WNL No JVD noted BS mildly diminished without wheezes or other adventitious sounds Reg, no M noted Abd soft, NT, +BS Ext + symmetric BLE pitting edema Neuro: diffusely weak, no focal deficits  Outpatient Encounter Prescriptions as of 07/03/2017  Medication Sig Note  . albuterol (PROAIR HFA) 108 (90 Base) MCG/ACT inhaler Inhale 1-2 puffs into the lungs every 6 (six) hours as needed for wheezing or shortness of breath. 05/15/2017: PRN  . Cholecalciferol (VITAMIN D3) 50000 units CAPS Take 50,000 Units by mouth once a week.   . finasteride (PROSCAR) 5 MG tablet Take 1 tablet (5 mg total) by mouth daily.   . furosemide (LASIX) 20 MG tablet Take 1 tablet (20 mg total) by mouth daily. (Patient taking differently: Take 40 mg by mouth daily. )   . levothyroxine (SYNTHROID, LEVOTHROID) 50 MCG tablet Take 1 tablet (50 mcg total) by mouth daily before breakfast.   . losartan (COZAAR) 50 MG tablet TAKE 1 TABLET BY MOUTH ONCE DAILY   . magnesium oxide (MAG-OX) 400 (241.3 Mg) MG tablet Take 1 tablet (400 mg total) by mouth daily.   . pantoprazole (PROTONIX) 40 MG tablet TAKE 1 TABLET BY MOUTH ONCE DAILY   . saccharomyces boulardii (FLORASTOR) 250 MG capsule Take 250 mg by mouth as needed.    . tamsulosin (FLOMAX) 0.4 MG CAPS capsule Take 1  capsule (0.4 mg total) by mouth daily.   Marland Kitchen umeclidinium-vilanterol (ANORO ELLIPTA) 62.5-25 MCG/INH AEPB Inhale 1 puff into the lungs daily.    No facility-administered encounter medications on file as of 07/03/2017.    DATA: BMP Latest Ref Rng & Units 03/28/2017 03/25/2017 03/21/2017  Glucose 70 - 99 mg/dL 147(H) 178(H) 209(H)  BUN 6 - 23 mg/dL 24(H) 33(H) 32(H)  Creatinine 0.40 - 1.50 mg/dL 1.16 1.25(H) 1.24  Sodium 135 - 145 mEq/L 133(L)  131(L) 131(L)  Potassium 3.5 - 5.1 mEq/L 4.3 4.4 4.2  Chloride 96 - 112 mEq/L 92(L) 93(L) 94(L)  CO2 19 - 32 mEq/L 36(H) 28 31  Calcium 8.4 - 10.5 mg/dL 9.3 8.9 8.5(L)    CBC Latest Ref Rng & Units 03/25/2017 03/21/2017 03/20/2017  WBC 3.8 - 10.6 K/uL 6.4 6.2 6.9  Hemoglobin 13.0 - 18.0 g/dL 12.2(L) 13.2 13.3  Hematocrit 40.0 - 52.0 % 36.8(L) 39.4(L) 40.7  Platelets 150 - 440 K/uL 253 282 290   CXR: NNF  IMPRESSION: COPD - mild obstruction at baseline.   Chronic hypercarbic respiratory failure   Moderate obstructive sleep apnea    PLAN: Continue previous COPD regimen: Anoro inhaler - one inhalation daily Albuterol (Proventil) inhaler as needed (first line rescue medication) DuoNeb to every 6 hours as needed (second line rescue medication)  Continue nocturnal BiPAP - he has benefited significantly from this therapy. Current settings 10/5  Follow-up with me in 6 months   Merton Border, MD PCCM service Mobile [phone] Pager [phone] 07/03/2017 10:09 AM

## 2017-08-08 ENCOUNTER — Telehealth: Payer: Self-pay

## 2017-08-08 DIAGNOSIS — Z96651 Presence of right artificial knee joint: Secondary | ICD-10-CM | POA: Diagnosis not present

## 2017-08-08 DIAGNOSIS — N400 Enlarged prostate without lower urinary tract symptoms: Secondary | ICD-10-CM | POA: Diagnosis not present

## 2017-08-08 DIAGNOSIS — J9622 Acute and chronic respiratory failure with hypercapnia: Secondary | ICD-10-CM | POA: Diagnosis not present

## 2017-08-08 DIAGNOSIS — Z9181 History of falling: Secondary | ICD-10-CM

## 2017-08-08 DIAGNOSIS — I10 Essential (primary) hypertension: Secondary | ICD-10-CM | POA: Diagnosis not present

## 2017-08-08 DIAGNOSIS — E1142 Type 2 diabetes mellitus with diabetic polyneuropathy: Secondary | ICD-10-CM | POA: Diagnosis not present

## 2017-08-08 DIAGNOSIS — J9612 Chronic respiratory failure with hypercapnia: Secondary | ICD-10-CM

## 2017-08-08 DIAGNOSIS — J9621 Acute and chronic respiratory failure with hypoxia: Secondary | ICD-10-CM | POA: Diagnosis not present

## 2017-08-08 DIAGNOSIS — E559 Vitamin D deficiency, unspecified: Secondary | ICD-10-CM | POA: Diagnosis not present

## 2017-08-08 DIAGNOSIS — J439 Emphysema, unspecified: Secondary | ICD-10-CM | POA: Diagnosis not present

## 2017-08-08 DIAGNOSIS — G4733 Obstructive sleep apnea (adult) (pediatric): Secondary | ICD-10-CM | POA: Diagnosis not present

## 2017-08-08 DIAGNOSIS — Z7951 Long term (current) use of inhaled steroids: Secondary | ICD-10-CM | POA: Diagnosis not present

## 2017-08-08 DIAGNOSIS — J9611 Chronic respiratory failure with hypoxia: Secondary | ICD-10-CM

## 2017-08-08 DIAGNOSIS — M5030 Other cervical disc degeneration, unspecified cervical region: Secondary | ICD-10-CM | POA: Diagnosis not present

## 2017-08-08 DIAGNOSIS — Z87891 Personal history of nicotine dependence: Secondary | ICD-10-CM | POA: Diagnosis not present

## 2017-08-08 NOTE — Telephone Encounter (Signed)
Forms to be filled out. Placed in red folder

## 2017-08-08 NOTE — Telephone Encounter (Signed)
Form signed and placed in box.

## 2017-08-09 NOTE — Telephone Encounter (Signed)
Form faxed and copy sent to charge.

## 2017-08-14 ENCOUNTER — Emergency Department: Payer: Medicare Other

## 2017-08-14 ENCOUNTER — Emergency Department
Admission: EM | Admit: 2017-08-14 | Discharge: 2017-08-15 | Disposition: A | Discharge status: Home / Self Care | Payer: Medicare Other | Attending: Emergency Medicine | Admitting: Emergency Medicine

## 2017-08-14 ENCOUNTER — Encounter: Payer: Self-pay | Admitting: Podiatry

## 2017-08-14 ENCOUNTER — Ambulatory Visit (INDEPENDENT_AMBULATORY_CARE_PROVIDER_SITE_OTHER): Payer: Medicare Other | Admitting: Podiatry

## 2017-08-14 DIAGNOSIS — J449 Chronic obstructive pulmonary disease, unspecified: Secondary | ICD-10-CM | POA: Diagnosis not present

## 2017-08-14 DIAGNOSIS — Z91040 Latex allergy status: Secondary | ICD-10-CM | POA: Diagnosis not present

## 2017-08-14 DIAGNOSIS — R531 Weakness: Secondary | ICD-10-CM | POA: Insufficient documentation

## 2017-08-14 DIAGNOSIS — J45909 Unspecified asthma, uncomplicated: Secondary | ICD-10-CM | POA: Insufficient documentation

## 2017-08-14 DIAGNOSIS — Z66 Do not resuscitate: Secondary | ICD-10-CM | POA: Diagnosis not present

## 2017-08-14 DIAGNOSIS — R1013 Epigastric pain: Secondary | ICD-10-CM | POA: Insufficient documentation

## 2017-08-14 DIAGNOSIS — B351 Tinea unguium: Secondary | ICD-10-CM

## 2017-08-14 DIAGNOSIS — E039 Hypothyroidism, unspecified: Secondary | ICD-10-CM | POA: Insufficient documentation

## 2017-08-14 DIAGNOSIS — E119 Type 2 diabetes mellitus without complications: Secondary | ICD-10-CM

## 2017-08-14 DIAGNOSIS — Z79899 Other long term (current) drug therapy: Secondary | ICD-10-CM | POA: Diagnosis not present

## 2017-08-14 DIAGNOSIS — M79609 Pain in unspecified limb: Secondary | ICD-10-CM

## 2017-08-14 DIAGNOSIS — I10 Essential (primary) hypertension: Secondary | ICD-10-CM | POA: Insufficient documentation

## 2017-08-14 DIAGNOSIS — Z87891 Personal history of nicotine dependence: Secondary | ICD-10-CM | POA: Diagnosis not present

## 2017-08-14 LAB — CBC
HEMATOCRIT: 37.2 % — AB (ref 40.0–52.0)
Hemoglobin: 12.4 g/dL — ABNORMAL LOW (ref 13.0–18.0)
MCH: 31.4 pg (ref 26.0–34.0)
MCHC: 33.3 g/dL (ref 32.0–36.0)
MCV: 94.2 fL (ref 80.0–100.0)
PLATELETS: 229 10*3/uL (ref 150–440)
RBC: 3.95 MIL/uL — ABNORMAL LOW (ref 4.40–5.90)
RDW: 13.2 % (ref 11.5–14.5)
WBC: 5.4 10*3/uL (ref 3.8–10.6)

## 2017-08-14 LAB — TROPONIN I

## 2017-08-14 LAB — COMPREHENSIVE METABOLIC PANEL
ALT: 10 U/L — ABNORMAL LOW (ref 17–63)
ANION GAP: 8 (ref 5–15)
AST: 21 U/L (ref 15–41)
Albumin: 4.2 g/dL (ref 3.5–5.0)
Alkaline Phosphatase: 65 U/L (ref 38–126)
BILIRUBIN TOTAL: 0.4 mg/dL (ref 0.3–1.2)
BUN: 20 mg/dL (ref 6–20)
CO2: 32 mmol/L (ref 22–32)
Calcium: 9 mg/dL (ref 8.9–10.3)
Chloride: 94 mmol/L — ABNORMAL LOW (ref 101–111)
Creatinine, Ser: 1.25 mg/dL — ABNORMAL HIGH (ref 0.61–1.24)
GFR, EST AFRICAN AMERICAN: 59 mL/min — AB (ref >60)
GFR, EST NON AFRICAN AMERICAN: 51 mL/min — AB (ref >60)
Glucose, Bld: 123 mg/dL — ABNORMAL HIGH (ref 65–99)
POTASSIUM: 4.1 mmol/L (ref 3.5–5.1)
Sodium: 134 mmol/L — ABNORMAL LOW (ref 135–145)
TOTAL PROTEIN: 6.9 g/dL (ref 6.5–8.1)

## 2017-08-14 LAB — LIPASE, BLOOD: Lipase: 35 U/L (ref 11–51)

## 2017-08-14 MED ORDER — IOPAMIDOL (ISOVUE-300) INJECTION 61%
100.0000 mL | Freq: Once | INTRAVENOUS | Status: AC | PRN
  Administered 2017-08-14: 100 mL via INTRAVENOUS

## 2017-08-14 NOTE — ED Provider Notes (Addendum)
[facility] Emergency Department Provider Note  ____________________________________________  Time seen: Approximately 10:27 PM  I have reviewed the triage vital signs and the nursing notes.   HISTORY  Chief Complaint Abdominal Pain    HPI Aaron Hendrix is a 83 y.o. male who complains of epigastric pain, generalized weakness and lightheadedness earlier today. Per EMS the patient was having cardiac causes. On the rhythm strip it looks like he had up to 2 second systolic pauses occasionally. Patient denies any chest pain shortness of breath or exertional symptoms. Just states that he's been feeling kind of sick today and his wife wanted him to come to the hospital to get evaluated. Epigastric pain is aching, mild to moderate intensity, nonradiating, no aggravating or alleviating factors.     Past Medical History:  Diagnosis Date  . Carpal tunnel syndrome   . COPD with asthma ([place])   . Diabetes mellitus    Diet control   . DJD (degenerative joint disease), cervical   . Duodenal ulcer, with partial obstruction 08/10/2012  . Essential and other specified forms of tremor 12/16/2013  . Hyperlipidemia   . Hypertension    no medicine needed  . IBS (irritable bowel syndrome)   . Internal hemorrhoids   . Memory deficit 12/16/2013  . Numbness and tingling in right hand    started 2 yeas ago  . Peptic ulcer   . Personal history of colonic polyps    adenomatous  . Polyneuropathy in diabetes(357.2)   . Tremor, essential   . Vitamin D deficiency      Patient Active Problem List   Diagnosis Date Noted  . Confusion 03/31/2017  . Respiratory failure with hypoxia and hypercapnia ([place]) 03/19/2017  . Palliative care by specialist 03/09/2017  . Hypercapnia   . Acute on chronic respiratory failure with hypercapnia ([place]) 03/08/2017  . Hypoxia 11/25/2016  . COPD exacerbation ([place]) 11/25/2016  . Rash 01/18/2016  . Nocturia 08/11/2015  . BPH with  obstruction/lower urinary tract symptoms 08/11/2015  . Hematemesis 07/29/2015  . Paralytic ileus ([place]) 07/29/2015  . Lower extremity edema 07/16/2015  . Increased urinary frequency 07/16/2015  . COPD (chronic obstructive pulmonary disease) ([place]) 07/16/2015  . Community acquired pneumonia   . Hyponatremia 06/25/2015  . Skin lesion 02/16/2015  . DNR (do not resuscitate) discussion 02/16/2015  . Elbow abrasion 09/27/2014  . Hypothyroidism 09/27/2014  . Chronic constipation 09/01/2014  . Trigger thumb 06/22/2014  . Abdominal pain 06/22/2014  . Personal history of other specified conditions 02/20/2014  . Decreased anal sphincter tone 02/19/2014  . Other specified symptoms and signs involving the digestive system and abdomen 02/19/2014  . Diabetes mellitus ([place]) 02/19/2014  . Memory deficit 12/16/2013  . Essential and other specified forms of tremor 12/16/2013  . Amnesia 12/16/2013  . Diarrhea 10/24/2013  . Loss of weight 09/10/2013  . Periumbilical abdominal pain 09/10/2013  . Pain in lower limb 07/01/2013  . Dermatophytosis of foot 07/01/2013  . Degenerative arthritis of hip 11/13/2012  . Osteoarthritis of hip 11/13/2012  . DU (duodenal ulcer) 08/22/2012  . Nausea 08/10/2012  . Hypertension 05/25/2011  . Diabetes mellitus, type II ([place]) 05/25/2011  . Irritable bowel syndrome 03/31/2008  . History of colonic polyps 03/31/2008  . Adaptive colitis 03/31/2008     Past Surgical History:  Procedure Laterality Date  . ANAL FISSURE REPAIR    . ANKLE SURGERY Right    right- pins placed in  . COLONOSCOPY W/ BIOPSIES AND POLYPECTOMY  8/03, 6/05,  7/09, 9/10   internal hemorrhoids, tubular adenomas, mucosa & lymphoid nodules  . INGUINAL HERNIA REPAIR Right   . TOTAL HIP ARTHROPLASTY ANTERIOR APPROACH Right 11/13/2012   Performed by Mcarthur Rossetti, MD at [facility]  . UPPER GASTROINTESTINAL ENDOSCOPY  3/05, 7/09, 9/10,2013   gastritis, duodenitis     Prior to Admission  medications   Medication Sig Start Date End Date Taking? Authorizing Provider  albuterol (PROAIR HFA) 108 (90 Base) MCG/ACT inhaler Inhale 1-2 puffs into the lungs every 6 (six) hours as needed for wheezing or shortness of breath. 04/03/17   [name], MD  Cholecalciferol (VITAMIN D3) 50000 units CAPS Take 50,000 Units by mouth once a week.    [provider]  finasteride (PROSCAR) 5 MG tablet Take 1 tablet (5 mg total) by mouth daily. 11/14/16   Zara Council A, PA-C  furosemide (LASIX) 20 MG tablet Take 1 tablet (20 mg total) by mouth daily. Patient taking differently: Take 40 mg by mouth daily.  07/22/15   Max Sane, MD  levothyroxine (SYNTHROID, LEVOTHROID) 50 MCG tablet Take 1 tablet (50 mcg total) by mouth daily before breakfast. 12/02/16   [name], MD  losartan (COZAAR) 50 MG tablet TAKE 1 TABLET BY MOUTH ONCE DAILY 04/26/17   [name], MD  magnesium oxide (MAG-OX) 400 (241.3 Mg) MG tablet Take 1 tablet (400 mg total) by mouth daily. 05/24/17   [name], MD  pantoprazole (PROTONIX) 40 MG tablet TAKE 1 TABLET BY MOUTH ONCE DAILY 05/30/17   [name], MD  saccharomyces boulardii (FLORASTOR) 250 MG capsule Take 250 mg by mouth as needed.     [provider]  tamsulosin (FLOMAX) 0.4 MG CAPS capsule Take 1 capsule (0.4 mg total) by mouth daily. 11/14/16   [name], [name], PA-C  umeclidinium-vilanterol (ANORO ELLIPTA) 62.5-25 MCG/INH AEPB Inhale 1 puff into the lungs daily. 04/03/17   [name], MD     Allergies Fexofenadine; Quinapril hcl; and Latex   Family History  Problem Relation Age of Onset  . Thyroid cancer Daughter 1  . Diabetes Son   . Urolithiasis Son   . Leukemia Maternal Aunt   . Colon cancer Neg Hx   . Esophageal cancer Neg Hx   . Rectal cancer Neg Hx   . Stomach cancer Neg Hx   . Prostate cancer Neg Hx   . Kidney disease Neg Hx   . Kidney cancer Neg Hx   . Bladder Cancer Neg Hx     Social History Social  History   Tobacco Use  . Smoking status: Former Smoker    Types: Cigarettes    Last attempt to quit: 07/02/1963    Years since quitting: 54.1  . Smokeless tobacco: Never Used  Substance Use Topics  . Alcohol use: No    Alcohol/week: 0.0 oz  . Drug use: No    Review of Systems  Constitutional:   No fever or chills.  ENT:   No sore throat. No rhinorrhea. Cardiovascular:   No chest pain or syncope. Respiratory:   No dyspnea or cough. Gastrointestinal:   Positive as above for abdominal pain without vomiting and diarrhea.  Musculoskeletal:   Negative for focal pain or swelling All other systems reviewed and are negative except as documented above in ROS and HPI.  ____________________________________________   PHYSICAL EXAM:  VITAL SIGNS: ED Triage Vitals  Enc Vitals Group     BP 08/14/17 2047 111/66     Pulse Rate 08/14/17  2047 71     Resp 08/14/17 2047 18     Temp 08/14/17 2047 98.6 F (37 C)     Temp Source 08/14/17 2047 Oral     SpO2 08/14/17 2047 99 %     Weight 08/14/17 2048 140 lb (63.5 kg)     Height --      Head Circumference --      Peak Flow --      Pain Score 08/14/17 2046 4     Pain Loc --      Pain Edu? --      Excl. in [place]? --     Vital signs reviewed, nursing assessments reviewed.   Constitutional:   Alert and oriented. Well appearing and in no distress. Eyes:   No scleral icterus.  EOMI. No nystagmus. No conjunctival pallor. PERRL. ENT   Head:   Normocephalic and atraumatic.   Nose:   No congestion/rhinnorhea.    Mouth/Throat:   MMM, no pharyngeal erythema. No peritonsillar mass.    Neck:   No meningismus. Full ROM. Hematological/Lymphatic/Immunilogical:   No cervical lymphadenopathy. Cardiovascular:   RRR. Symmetric bilateral radial and DP pulses.  No murmurs.  Respiratory:   Normal respiratory effort without tachypnea/retractions. Breath sounds are clear and equal bilaterally. No wheezes/rales/rhonchi. Gastrointestinal:   Soft with  mild lower abdominal tenderness. Non distended. There is no CVA tenderness.  No rebound, rigidity, or guarding. Genitourinary:   deferred Musculoskeletal:   Normal range of motion in all extremities. No joint effusions.  No lower extremity tenderness.  No edema. Neurologic:   Normal speech and language.  Motor grossly intact. No gross focal neurologic deficits are appreciated.  Skin:    Skin is warm, dry and intact. No rash noted.  No petechiae, purpura, or bullae.  ____________________________________________    LABS (pertinent positives/negatives) (all labs ordered are listed, but only abnormal results are displayed) Labs Reviewed  COMPREHENSIVE METABOLIC PANEL - Abnormal; Notable for the following components:      Result Value   Sodium 134 (*)    Chloride 94 (*)    Glucose, Bld 123 (*)    Creatinine, Ser 1.25 (*)    ALT 10 (*)    GFR calc non Af Amer 51 (*)    GFR calc Af Amer 59 (*)    All other components within normal limits  CBC - Abnormal; Notable for the following components:   RBC 3.95 (*)    Hemoglobin 12.4 (*)    HCT 37.2 (*)    All other components within normal limits  LIPASE, BLOOD  TROPONIN I  URINALYSIS, COMPLETE (UACMP) WITH MICROSCOPIC   ____________________________________________   EKG Interpreted by me Interpreted by me Sinus rhythm rate of 68, left axis, normal intervals. Normal QRS ST segments and T waves. No acute ischemic changes.   ____________________________________________    RADIOLOGY  Dg Chest 2 View  Result Date: 08/14/2017 CLINICAL DATA:  Generalized weakness and epigastric pain. EXAM: CHEST  2 VIEW COMPARISON:  03/25/2017 FINDINGS: The heart size and mediastinal contours are within normal limits. Both lungs are clear. There is re- demonstration of colonic interposition over the liver shadow. The visualized skeletal structures are unremarkable. IMPRESSION: No active cardiopulmonary disease. Electronically Signed   By: Ashley Royalty  M.D.   On: 08/14/2017 21:19   Ct Abdomen Pelvis W Contrast  Result Date: 08/14/2017 CLINICAL DATA:  Epigastric pain with syncope.  Sinus bradycardia. EXAM: CT ABDOMEN AND PELVIS WITH CONTRAST TECHNIQUE: Multidetector CT imaging of  the abdomen and pelvis was performed using the standard protocol following bolus administration of intravenous contrast. CONTRAST:  12mL ISOVUE-300 IOPAMIDOL (ISOVUE-300) INJECTION 61% COMPARISON:  None. FINDINGS: Lower chest: The included heart size is normal without pericardial effusion. There is left main and three-vessel coronary arteriosclerosis. There is mild-to-moderate atherosclerosis of the included lower thoracic aorta without aneurysm or dissection. Hepatobiliary: Layering gallstones are identified within the gallbladder without secondary signs of acute cholecystitis. Homogeneous enhancement of the liver without biliary dilatation or mass. Pancreas: Normal Spleen: Normal size spleen without mass. Adrenals/Urinary Tract: Normal bilateral adrenal glands. 17 mm right upper pole simple renal cyst with smaller cortical cysts seen on repeat delayed imaging within both kidneys. No obstructive uropathy or nephrolithiasis. Right-sided bladder diverticular are present with mild circumferential thickening of the urinary bladder possibly from chronic bladder outlet obstruction. This may be due to an enlarged prostate. Stomach/Bowel: Similar to prior, there are fluid-filled small and large bowel loops with distention suggesting dysmotility or ileus. No mechanical source of obstruction is identified. Appendix is not definitively identified but no pericecal inflammation to suggest appendicitis. Vascular/Lymphatic: Mild to moderate aortoiliac atherosclerosis without aneurysm or dissection. Reproductive: The prostate appears enlarged though suboptimally assessed otherwise due to metallic streak artifacts from the patient's adjacent right hip arthroplasty. Other: Fatty atrophy of the  abdominal wall musculature. No focal herniation. Musculoskeletal: Right hip arthroplasty. Multilevel degenerative disc disease with vacuum disc phenomena along the lower lumbar spine from L2 through L5 consistent with spondylosis. IMPRESSION: 1. Diffuse small and large bowel gaseous distention without mechanical source of obstruction consistent with ileus or dysmotility. 2. Left main and three-vessel coronary arteriosclerosis. 3. Aortoiliac atherosclerosis without aneurysm. 4. Bilateral renal cysts the largest is in the upper pole the right kidney measuring 17 mm. No obstructive uropathy. 5. Mild diffuse bladder wall thickening with right-sided bladder diverticula. This in conjunction with an enlarged prostate may represent stigmata of chronic bladder outlet obstruction. Electronically Signed   By: Ashley Royalty M.D.   On: 08/14/2017 22:49    ____________________________________________   PROCEDURES Procedures  ____________________________________________   DIFFERENTIAL DIAGNOSIS  Bowel perforation, obstruction, appendicitis, cholecystitis, AAA  CLINICAL IMPRESSION / ASSESSMENT AND PLAN / ED COURSE  Pertinent labs & imaging results that were available during my care of the patient were reviewed by me and considered in my medical decision making (see chart for details).   Patient well appearing not in distress, but presents with epigastric pain and lightheadedness. Concern for intra-abdominal pathology, possibly surgical. Vital signs are stable, patient is calm and comfortable presently. Check labs and a CT scan. Doubt ACS PE or dissection.  Clinical Course as of Aug 15 26  Tue Aug 15, 2017  0002 Pt feels back to normal, wants to go home. Wife and son at bedside, who note that pt is always on bland diet due to gallstones, but today he ate friend shrimp at [employer] which caused the symptoms. Doesn't have cholecystitis, but this is all likely related to biliary colic. Exam not c/w  ileus/sbo. Will PO trial.  [PS]    Clinical Course User Index [PS] Carrie Mew, MD     ----------------------------------------- 12:38 AM on 08/15/2017 -----------------------------------------  Patient feels much better, tolerated oral intake. Wants to go home. We'll discharge.  ____________________________________________   FINAL CLINICAL IMPRESSION(S) / ED DIAGNOSES    Final diagnoses:  Epigastric pain      This SmartLink is deprecated. Use AVSMEDLIST instead to display the medication list for a patient.   Portions  of this note were generated with dragon dictation software. Dictation errors may occur despite best attempts at proofreading.    Carrie Mew, MD 08/15/17 [id number]    Carrie Mew, MD 08/15/17 [phone]

## 2017-08-14 NOTE — Progress Notes (Signed)
Complaint:  Visit Type: Patient returns to my office for continued preventative foot care services. Complaint: Patient states" my nails have grown long and thick and become painful to walk and wear shoes" . The patient presents for preventative foot care services. No changes to ROS  Podiatric Exam: Vascular: dorsalis pedis and posterior tibial pulses are palpable bilateral. Capillary return is immediate. Temperature gradient is WNL. Skin turgor WNL  Sensorium: Normal Semmes Weinstein monofilament test. Normal tactile sensation bilaterally. Nail Exam: Pt has thick disfigured discolored nails with subungual debris noted bilateral entire nail hallux through fifth toenails Ulcer Exam: There is no evidence of ulcer or pre-ulcerative changes or infection. Orthopedic Exam: Muscle tone and strength are WNL. No limitations in general ROM. No crepitus or effusions noted. Foot type and digits show no abnormalities. Bony prominences are unremarkable. Skin: No Porokeratosis. No infection or ulcers  Diagnosis:  Onychomycosis, , Pain in right toe, pain in left toes  Treatment & Plan Procedures and Treatment: Consent by patient was obtained for treatment procedures. The patient understood the discussion of treatment and procedures well. All questions were answered thoroughly reviewed. Debridement of mycotic and hypertrophic toenails, 1 through 5 bilateral and clearing of subungual debris. No ulceration, no infection noted.  Return Visit-Office Procedure: Patient instructed to return to the office for a follow up visit 10 weeks  for continued evaluation and treatment.    Gardiner Barefoot DPM

## 2017-08-14 NOTE — ED Triage Notes (Signed)
Pt from home via [facility].  Pt having epigastric pain, pt had syncopal episode where pt turned gray per EMS.  Per EMS, pt have 3-5 second pauses causing him to go into Sinus [place] into the 17s.  Pt is A&Ox4.

## 2017-08-15 NOTE — ED Notes (Signed)
Pt was able to tolerate food and drink. No n/v reported.

## 2017-08-15 NOTE — ED Notes (Signed)
Pt given graham cracker and water.

## 2017-09-10 ENCOUNTER — Other Ambulatory Visit: Payer: Self-pay | Admitting: Internal Medicine

## 2017-09-10 ENCOUNTER — Other Ambulatory Visit: Payer: Self-pay | Admitting: Pulmonary Disease

## 2017-09-12 ENCOUNTER — Encounter: Payer: Self-pay | Admitting: Internal Medicine

## 2017-09-12 ENCOUNTER — Ambulatory Visit (INDEPENDENT_AMBULATORY_CARE_PROVIDER_SITE_OTHER): Payer: Medicare Other | Admitting: Internal Medicine

## 2017-09-12 VITALS — BP 126/68 | HR 86 | Temp 98.1°F | Ht 64.0 in | Wt 140.4 lb

## 2017-09-12 DIAGNOSIS — I2510 Atherosclerotic heart disease of native coronary artery without angina pectoris: Secondary | ICD-10-CM

## 2017-09-12 DIAGNOSIS — E119 Type 2 diabetes mellitus without complications: Secondary | ICD-10-CM | POA: Diagnosis not present

## 2017-09-12 DIAGNOSIS — N401 Enlarged prostate with lower urinary tract symptoms: Secondary | ICD-10-CM | POA: Diagnosis not present

## 2017-09-12 DIAGNOSIS — Z23 Encounter for immunization: Secondary | ICD-10-CM | POA: Diagnosis not present

## 2017-09-12 DIAGNOSIS — J9622 Acute and chronic respiratory failure with hypercapnia: Secondary | ICD-10-CM

## 2017-09-12 DIAGNOSIS — Z0000 Encounter for general adult medical examination without abnormal findings: Secondary | ICD-10-CM | POA: Diagnosis not present

## 2017-09-12 DIAGNOSIS — N138 Other obstructive and reflux uropathy: Secondary | ICD-10-CM

## 2017-09-12 DIAGNOSIS — R0981 Nasal congestion: Secondary | ICD-10-CM | POA: Diagnosis not present

## 2017-09-12 MED ORDER — FLUTICASONE PROPIONATE 50 MCG/ACT NA SUSP: 2.0000 | Freq: Every day | NASAL | 6 refills | Status: DC

## 2017-09-12 NOTE — Progress Notes (Signed)
[name], male   DOB: Oct 24, 1929, 83 y.o.   MRN: [id number]   Subjective:    [name], male    DOB: 05/22/1930, 83 y.o.   MRN: [id number]  HPI  Patient here for his physical exam.  He is accompanied by his wife.  History obtained from both of them.  Was recently evaluated in ER (08/14/17).  Had epigastric pain.  CT obtained as outlined.  Pain resolved.  Discharged home.  Has not had any pain since.  Eating.  No nausea or vomiting.  Bowels moving.  Breathing stable.  Using BiPAP.  Seeing pulmonary.  Placed on anoro.  CAD (including left main and tree vessel coronary arteriosclerosis).  Just saw Dr Forde Dandy.  Stable.  Labs performed.     Past Medical History:  Diagnosis Date  . Carpal tunnel syndrome   . COPD with asthma ([place])   . Diabetes mellitus    Diet control   . DJD (degenerative joint disease), cervical   . Duodenal ulcer, with partial obstruction 08/10/2012  . Essential and other specified forms of tremor 12/16/2013  . Hyperlipidemia   . Hypertension    no medicine needed  . IBS (irritable bowel syndrome)   . Internal hemorrhoids   . Memory deficit 12/16/2013  . Numbness and tingling in right hand    started 2 yeas ago  . Peptic ulcer   . Personal history of colonic polyps    adenomatous  . Polyneuropathy in diabetes(357.2)   . Tremor, essential   . Vitamin D deficiency    Past Surgical History:  Procedure Laterality Date  . ANAL FISSURE REPAIR    . ANKLE SURGERY Right    right- pins placed in  . COLONOSCOPY W/ BIOPSIES AND POLYPECTOMY  8/03, 6/05, 7/09, 9/10   internal hemorrhoids, tubular adenomas, mucosa & lymphoid nodules  . INGUINAL HERNIA REPAIR Right   . TOTAL HIP ARTHROPLASTY Right 11/13/2012   Procedure: TOTAL HIP ARTHROPLASTY ANTERIOR APPROACH;  Surgeon: Mcarthur Rossetti, MD;  Location: [facility];  Service: Orthopedics;  Laterality: Right;  Right total hip arthroplasty  . UPPER GASTROINTESTINAL ENDOSCOPY  3/05, 7/09,  9/10,2013   gastritis, duodenitis   Family History  Problem Relation Age of Onset  . Thyroid cancer Daughter 50  . Diabetes Son   . Urolithiasis Son   . Leukemia Maternal Aunt   . Colon cancer Neg Hx   . Esophageal cancer Neg Hx   . Rectal cancer Neg Hx   . Stomach cancer Neg Hx   . Prostate cancer Neg Hx   . Kidney disease Neg Hx   . Kidney cancer Neg Hx   . Bladder Cancer Neg Hx    Social History   Socioeconomic History  . Marital status: Married    Spouse name: None  . Number of children: 3  . Years of education: MA  . Highest education level: None  Social Needs  . Financial resource strain: None  . Food insecurity - worry: None  . Food insecurity - inability: None  . Transportation needs - medical: None  . Transportation needs - non-medical: None  Occupational History  . Occupation: [employer]    CommentScientist, clinical (histocompatibility and immunogenetics)  Tobacco Use  . Smoking status: Former Smoker    Types: Cigarettes    Last attempt to quit: 07/02/1963    Years since quitting: 54.2  . Smokeless tobacco: Never Used  Substance and Sexual Activity  . Alcohol use: No  Alcohol/week: 0.0 oz  . Drug use: No  . Sexual activity: None  Other Topics Concern  . None  Social History Narrative   Lives with wife    Caffeine use: 1 cup coffee per day   Right handed    Veteran [employer]    Outpatient Encounter Medications as of 09/12/2017  Medication Sig  . Cholecalciferol (VITAMIN D3) 50000 units CAPS Take 50,000 Units by mouth once a week.  . finasteride (PROSCAR) 5 MG tablet Take 1 tablet (5 mg total) by mouth daily.  . furosemide (LASIX) 20 MG tablet Take 1 tablet (20 mg total) by mouth daily. (Patient taking differently: Take 40 mg by mouth daily. )  . levothyroxine (SYNTHROID, LEVOTHROID) 50 MCG tablet Take 1 tablet (50 mcg total) by mouth daily before breakfast.  . losartan (COZAAR) 50 MG tablet TAKE 1 TABLET BY MOUTH ONCE DAILY  . magnesium oxide (MAG-OX) 400 (241.3 Mg) MG tablet Take 1  tablet (400 mg total) by mouth daily.  . pantoprazole (PROTONIX) 40 MG tablet TAKE 1 TABLET BY MOUTH ONCE DAILY  . PROAIR HFA 108 (90 Base) MCG/ACT inhaler INHALE 1-2 PUFFS INTO THE LUNGS EVERY 6 HOURS AS NEEDED FOR WHEEZING OR SHORTNESS OF BREATH  . saccharomyces boulardii (FLORASTOR) 250 MG capsule Take 250 mg by mouth as needed.   . tamsulosin (FLOMAX) 0.4 MG CAPS capsule Take 1 capsule (0.4 mg total) by mouth daily.  Marland Kitchen umeclidinium-vilanterol (ANORO ELLIPTA) 62.5-25 MCG/INH AEPB Inhale 1 puff into the lungs daily.  . fluticasone (FLONASE) 50 MCG/ACT nasal spray Place 2 sprays into both nostrils daily.   No facility-administered encounter medications on file as of 09/12/2017.     Review of Systems  Constitutional: Negative for appetite change and unexpected weight change.  HENT: Negative for sinus pressure.        Some congestion.  Uses flonase.    Eyes: Negative for pain and visual disturbance.  Respiratory: Negative for cough and chest tightness.        Breathing stable.   Cardiovascular: Negative for chest pain and palpitations.  Gastrointestinal: Negative for abdominal pain, diarrhea, nausea and vomiting.  Genitourinary: Negative for difficulty urinating and dysuria.  Musculoskeletal: Negative for joint swelling and myalgias.  Skin: Negative for color change and rash.  Neurological: Negative for dizziness, light-headedness and headaches.  Hematological: Negative for adenopathy. Does not bruise/bleed easily.  Psychiatric/Behavioral: Negative for agitation and dysphoric mood.       Objective:    Physical Exam  Constitutional: He is oriented to person, place, and time. He appears well-developed and well-nourished. No distress.  HENT:  Head: Normocephalic and atraumatic.  Nose: Nose normal.  Mouth/Throat: Oropharynx is clear and moist. No oropharyngeal exudate.  Eyes: Conjunctivae are normal. Right eye exhibits no discharge. Left eye exhibits no discharge.  Neck: Neck supple.  No thyromegaly present.  Cardiovascular: Normal rate and regular rhythm.  Pulmonary/Chest: Breath sounds normal. No respiratory distress. He has no wheezes.  Abdominal: Soft. Bowel sounds are normal. There is no tenderness.  Genitourinary:  Genitourinary Comments: Not performed.   Musculoskeletal: He exhibits no tenderness.  No increased edema.   Lymphadenopathy:    He has no cervical adenopathy.  Neurological: He is alert and oriented to person, place, and time.  Skin: Skin is warm and dry. No rash noted. No erythema.  Psychiatric: He has a normal mood and affect. His behavior is normal.    BP 126/68   Pulse 86   Temp 98.1 F (36.7  C) (Oral)   Ht _0  (1.626 m)   Wt 140 lb 6.4 oz (63.7 kg)   SpO2 96%   BMI 24.10 kg/m  Wt Readings from Last 3 Encounters:  09/12/17 140 lb 6.4 oz (63.7 kg)  08/14/17 140 lb (63.5 kg)  07/03/17 140 lb (63.5 kg)     Lab Results  Component Value Date   WBC 5.4 08/14/2017   HGB 12.4 (L) 08/14/2017   HCT 37.2 (L) 08/14/2017   PLT 229 08/14/2017   GLUCOSE 123 (H) 08/14/2017   CHOL 223 (H) 06/24/2015   TRIG 53.0 06/24/2015   HDL 96.10 06/24/2015   LDLCALC 116 (H) 06/24/2015   ALT 10 (L) 08/14/2017   AST 21 08/14/2017   NA 134 (L) 08/14/2017   K 4.1 08/14/2017   CL 94 (L) 08/14/2017   CREATININE 1.25 (H) 08/14/2017   BUN 20 08/14/2017   CO2 32 08/14/2017   TSH 1.38 04/21/2016   PSA 0.83 02/06/2015   INR 0.94 11/06/2012   HGBA1C 6.5 03/28/2017   MICROALBUR 93.8 (H) 06/24/2015    Dg Chest 2 View  Result Date: 08/14/2017 CLINICAL DATA:  Generalized weakness and epigastric pain. EXAM: CHEST  2 VIEW COMPARISON:  03/25/2017 FINDINGS: The heart size and mediastinal contours are within normal limits. Both lungs are clear. There is re- demonstration of colonic interposition over the liver shadow. The visualized skeletal structures are unremarkable. IMPRESSION: No active cardiopulmonary disease. Electronically Signed   By: Ashley Royalty M.D.    On: 08/14/2017 21:19   Ct Abdomen Pelvis W Contrast  Result Date: 08/14/2017 CLINICAL DATA:  Epigastric pain with syncope.  Sinus bradycardia. EXAM: CT ABDOMEN AND PELVIS WITH CONTRAST TECHNIQUE: Multidetector CT imaging of the abdomen and pelvis was performed using the standard protocol following bolus administration of intravenous contrast. CONTRAST:  160m ISOVUE-300 IOPAMIDOL (ISOVUE-300) INJECTION 61% COMPARISON:  None. FINDINGS: Lower chest: The included heart size is normal without pericardial effusion. There is left main and three-vessel coronary arteriosclerosis. There is mild-to-moderate atherosclerosis of the included lower thoracic aorta without aneurysm or dissection. Hepatobiliary: Layering gallstones are identified within the gallbladder without secondary signs of acute cholecystitis. Homogeneous enhancement of the liver without biliary dilatation or mass. Pancreas: Normal Spleen: Normal size spleen without mass. Adrenals/Urinary Tract: Normal bilateral adrenal glands. 17 mm right upper pole simple renal cyst with smaller cortical cysts seen on repeat delayed imaging within both kidneys. No obstructive uropathy or nephrolithiasis. Right-sided bladder diverticular are present with mild circumferential thickening of the urinary bladder possibly from chronic bladder outlet obstruction. This may be due to an enlarged prostate. Stomach/Bowel: Similar to prior, there are fluid-filled small and large bowel loops with distention suggesting dysmotility or ileus. No mechanical source of obstruction is identified. Appendix is not definitively identified but no pericecal inflammation to suggest appendicitis. Vascular/Lymphatic: Mild to moderate aortoiliac atherosclerosis without aneurysm or dissection. Reproductive: The prostate appears enlarged though suboptimally assessed otherwise due to metallic streak artifacts from the patient's adjacent right hip arthroplasty. Other: Fatty atrophy of the abdominal  wall musculature. No focal herniation. Musculoskeletal: Right hip arthroplasty. Multilevel degenerative disc disease with vacuum disc phenomena along the lower lumbar spine from L2 through L5 consistent with spondylosis. IMPRESSION: 1. Diffuse small and large bowel gaseous distention without mechanical source of obstruction consistent with ileus or dysmotility. 2. Left main and three-vessel coronary arteriosclerosis. 3. Aortoiliac atherosclerosis without aneurysm. 4. Bilateral renal cysts the largest is in the upper pole the right kidney measuring 17 mm. No  obstructive uropathy. 5. Mild diffuse bladder wall thickening with right-sided bladder diverticula. This in conjunction with an enlarged prostate may represent stigmata of chronic bladder outlet obstruction. Electronically Signed   By: Ashley Royalty M.D.   On: 08/14/2017 22:49       Assessment & Plan:   Problem List Items Addressed This Visit    Acute on chronic respiratory failure with hypercapnia ([place])    On BiPAP.  Breathing better.  Follow.        BPH with obstruction/lower urinary tract symptoms    Followed by urology.       Diabetes mellitus ([place])    Follow met b and a1c.        Healthcare maintenance    Physical today.  Followed by urology.        Other Visit Diagnoses    Routine general medical examination at a health care facility    -  Primary   Coronary artery disease involving native coronary artery of native heart without angina pectoris       Relevant Orders   Ambulatory referral to Cardiology   Nasal congestion       Flonase as directed.  follow.         [name], MD

## 2017-09-12 NOTE — Progress Notes (Signed)
Pre visit review using our clinic review tool, if applicable. No additional management support is needed unless otherwise documented below in the visit note. 

## 2017-09-14 ENCOUNTER — Encounter: Payer: Self-pay | Admitting: Internal Medicine

## 2017-09-14 DIAGNOSIS — Z0000 Encounter for general adult medical examination without abnormal findings: Secondary | ICD-10-CM | POA: Insufficient documentation

## 2017-09-14 NOTE — Assessment & Plan Note (Signed)
Physical today.  Followed by urology.

## 2017-09-14 NOTE — Assessment & Plan Note (Signed)
On BiPAP.  Breathing better.  Follow.

## 2017-09-14 NOTE — Assessment & Plan Note (Signed)
Follow met b and a1c.

## 2017-09-14 NOTE — Assessment & Plan Note (Signed)
Followed by urology.   

## 2017-10-23 ENCOUNTER — Ambulatory Visit (INDEPENDENT_AMBULATORY_CARE_PROVIDER_SITE_OTHER): Payer: Medicare Other | Admitting: Podiatry

## 2017-10-23 ENCOUNTER — Encounter: Payer: Self-pay | Admitting: Podiatry

## 2017-10-23 DIAGNOSIS — B351 Tinea unguium: Secondary | ICD-10-CM | POA: Diagnosis not present

## 2017-10-23 DIAGNOSIS — E119 Type 2 diabetes mellitus without complications: Secondary | ICD-10-CM

## 2017-10-23 DIAGNOSIS — M79609 Pain in unspecified limb: Secondary | ICD-10-CM

## 2017-10-23 NOTE — Progress Notes (Signed)
Complaint:  Visit Type: Patient returns to my office for continued preventative foot care services. Complaint: Patient states" my nails have grown long and thick and become painful to walk and wear shoes" . The patient presents for preventative foot care services. No changes to ROS  Podiatric Exam: Vascular: dorsalis pedis and posterior tibial pulses are palpable bilateral. Capillary return is immediate. Temperature gradient is WNL. Skin turgor WNL  Sensorium: Normal Semmes Weinstein monofilament test. Normal tactile sensation bilaterally. Nail Exam: Pt has thick disfigured discolored nails with subungual debris noted bilateral entire nail hallux through fifth toenails Ulcer Exam: There is no evidence of ulcer or pre-ulcerative changes or infection. Orthopedic Exam: Muscle tone and strength are WNL. No limitations in general ROM. No crepitus or effusions noted. Foot type and digits show no abnormalities. Bony prominences are unremarkable. Skin: No Porokeratosis. No infection or ulcers  Diagnosis:  Onychomycosis, , Pain in right toe, pain in left toes  Treatment & Plan Procedures and Treatment: Consent by patient was obtained for treatment procedures. The patient understood the discussion of treatment and procedures well. All questions were answered thoroughly reviewed. Debridement of mycotic and hypertrophic toenails, 1 through 5 bilateral and clearing of subungual debris. No ulceration, no infection noted.  Return Visit-Office Procedure: Patient instructed to return to the office for a follow up visit 10 weeks  for continued evaluation and treatment.    Gardiner Barefoot DPM

## 2017-11-15 ENCOUNTER — Ambulatory Visit: Payer: Medicare Other | Admitting: Urology

## 2017-11-22 ENCOUNTER — Encounter: Payer: Self-pay | Admitting: Urology

## 2017-11-22 ENCOUNTER — Ambulatory Visit (INDEPENDENT_AMBULATORY_CARE_PROVIDER_SITE_OTHER): Payer: Medicare Other | Admitting: Urology

## 2017-11-22 VITALS — BP 138/79 | HR 67 | Resp 16 | Ht 64.0 in | Wt 132.1 lb

## 2017-11-22 DIAGNOSIS — N401 Enlarged prostate with lower urinary tract symptoms: Secondary | ICD-10-CM | POA: Diagnosis not present

## 2017-11-22 DIAGNOSIS — N138 Other obstructive and reflux uropathy: Secondary | ICD-10-CM | POA: Diagnosis not present

## 2017-11-22 DIAGNOSIS — N393 Stress incontinence (female) (male): Secondary | ICD-10-CM | POA: Diagnosis not present

## 2017-11-22 DIAGNOSIS — R351 Nocturia: Secondary | ICD-10-CM

## 2017-11-22 LAB — BLADDER SCAN AMB NON-IMAGING: Scan Result: 233

## 2017-11-22 MED ORDER — FINASTERIDE 5 MG PO TABS: 5.0000 mg | ORAL_TABLET | Freq: Every day | ORAL | 3 refills | Status: DC

## 2017-11-22 MED ORDER — TAMSULOSIN HCL 0.4 MG PO CAPS: 0.4000 mg | ORAL_CAPSULE | Freq: Every day | ORAL | 3 refills | Status: AC

## 2017-11-22 NOTE — Progress Notes (Signed)
3:24 PM   Aaron Hendrix 1930/03/12 [id number]  Referring provider: [name], [address] [place], [place] [address]  Chief Complaint  Patient presents with  . Follow-up    HPI: Patient is an 84 year old  African American male who presents today for a six months follow up.  Previous history Patient was seen during a recent admission for hyponatremia with nocturia and was started on tamsulosin for his moderate PVR's of 200 cc.  He was experiencing urinary frequency, urgency, nocturia, leakage of urine, intermittency, hesitancy, straining to urinate and a weak stream.  He has been diagnosed with sleep apnea and is sleeping with his BiPAP machine.  A median lobe of the prostate was appreciated on a CT scan in 05/2014.  BPH WITH LUTS His IPSS score today is 15, which is moderate lower urinary tract symptomatology.  He is pleased with his quality life due to his urinary symptoms.  His previous IPSS score was 10/1.   His previous PVR was 110 mL.  His complaint(s) today are/is frequency and nocturia.  He denies any dysuria, hematuria or suprapubic pain.  He currently taking tamsulosin 0.4 mg and finasteride 5 mg daily.   He also denies any recent fevers, chills, nausea or vomiting.  He does not have a family history of PCa.   IPSS    Row Name 11/22/17 1500         International Prostate Symptom Score   How often have you had the sensation of not emptying your bladder?  Almost always     How often have you had to urinate less than every two hours?  About half the time     How often have you found you stopped and started again several times when you urinated?  Not at All     How often have you found it difficult to postpone urination?  About half the time     How often have you had a weak urinary stream?  Less than 1 in 5 times     How often have you had to strain to start urination?  Not at All     How many times did you typically get up at night to  urinate?  3 Times     Total IPSS Score  15       Quality of Life due to urinary symptoms   If you were to spend the rest of your life with your urinary condition just the way it is now how would you feel about that?  Pleased        Score:  1-7 Mild 8-19 Moderate 20-35 Severe     PMH: Past Medical History:  Diagnosis Date  . Carpal tunnel syndrome   . COPD with asthma ([place])   . Diabetes mellitus    Diet control   . DJD (degenerative joint disease), cervical   . Duodenal ulcer, with partial obstruction 08/10/2012  . Essential and other specified forms of tremor 12/16/2013  . Hyperlipidemia   . Hypertension    no medicine needed  . IBS (irritable bowel syndrome)   . Internal hemorrhoids   . Memory deficit 12/16/2013  . Numbness and tingling in right hand    started 2 yeas ago  . Peptic ulcer   . Personal history of colonic polyps    adenomatous  . Polyneuropathy in diabetes(357.2)   . Tremor, essential   . Vitamin D deficiency     Surgical History:  Past Surgical History:  Procedure Laterality Date  . ANAL FISSURE REPAIR    . ANKLE SURGERY Right    right- pins placed in  . COLONOSCOPY W/ BIOPSIES AND POLYPECTOMY  8/03, 6/05, 7/09, 9/10   internal hemorrhoids, tubular adenomas, mucosa & lymphoid nodules  . INGUINAL HERNIA REPAIR Right   . TOTAL HIP ARTHROPLASTY Right 11/13/2012   Procedure: TOTAL HIP ARTHROPLASTY ANTERIOR APPROACH;  Surgeon: Mcarthur Rossetti, MD;  Location: [facility];  Service: Orthopedics;  Laterality: Right;  Right total hip arthroplasty  . UPPER GASTROINTESTINAL ENDOSCOPY  3/05, 7/09, 9/10,2013   gastritis, duodenitis    Home Medications:  Allergies as of 11/22/2017      Reactions   Fexofenadine Other (See Comments)   Quinapril Hcl Other (See Comments)   Unknown    Latex Rash      Medication List        Accurate as of 11/22/17  3:24 PM. Always use your most recent med list.          finasteride 5 MG tablet Commonly known as:   PROSCAR Take 1 tablet (5 mg total) by mouth daily.   fluticasone 50 MCG/ACT nasal spray Commonly known as:  FLONASE Place 2 sprays into both nostrils daily.   furosemide 20 MG tablet Commonly known as:  LASIX Take 1 tablet (20 mg total) by mouth daily.   levothyroxine 50 MCG tablet Commonly known as:  SYNTHROID, LEVOTHROID Take 1 tablet (50 mcg total) by mouth daily before breakfast.   losartan 50 MG tablet Commonly known as:  COZAAR TAKE 1 TABLET BY MOUTH ONCE DAILY   magnesium oxide 400 (241.3 Mg) MG tablet Commonly known as:  MAG-OX Take 1 tablet (400 mg total) by mouth daily.   pantoprazole 40 MG tablet Commonly known as:  PROTONIX TAKE 1 TABLET BY MOUTH ONCE DAILY   PROAIR HFA 108 (90 Base) MCG/ACT inhaler Generic drug:  albuterol INHALE 1-2 PUFFS INTO THE LUNGS EVERY 6 HOURS AS NEEDED FOR WHEEZING OR SHORTNESS OF BREATH   saccharomyces boulardii 250 MG capsule Commonly known as:  FLORASTOR Take 250 mg by mouth as needed.   tamsulosin 0.4 MG Caps capsule Commonly known as:  FLOMAX Take 1 capsule (0.4 mg total) by mouth daily.   umeclidinium-vilanterol 62.5-25 MCG/INH Aepb Commonly known as:  ANORO ELLIPTA Inhale 1 puff into the lungs daily.   Vitamin D3 50000 units Caps Take 50,000 Units by mouth once a week.       Allergies:  Allergies  Allergen Reactions  . Fexofenadine Other (See Comments)  . Quinapril Hcl Other (See Comments)    Unknown   . Latex Rash    Family History: Family History  Problem Relation Age of Onset  . Thyroid cancer Daughter 29  . Diabetes Son   . Urolithiasis Son   . Leukemia Maternal Aunt   . Colon cancer Neg Hx   . Esophageal cancer Neg Hx   . Rectal cancer Neg Hx   . Stomach cancer Neg Hx   . Prostate cancer Neg Hx   . Kidney disease Neg Hx   . Kidney cancer Neg Hx   . Bladder Cancer Neg Hx     Social History:  reports that he quit smoking about 54 years ago. His smoking use included cigarettes. he has never used  smokeless tobacco. He reports that he does not drink alcohol or use drugs.  ROS: UROLOGY Frequent Urination?: No Hard to postpone urination?: No Burning/pain with urination?: No  Get up at night to urinate?: No Leakage of urine?: No Urine stream starts and stops?: No Trouble starting stream?: No Do you have to strain to urinate?: No Blood in urine?: No Urinary tract infection?: No Sexually transmitted disease?: No Injury to kidneys or bladder?: No Painful intercourse?: No Weak stream?: No Erection problems?: No Penile pain?: No  Gastrointestinal Nausea?: No Vomiting?: No Indigestion/heartburn?: No Diarrhea?: No Constipation?: No  Constitutional Fever: No Night sweats?: No Weight loss?: No Fatigue?: No  Skin Skin rash/lesions?: No Itching?: No  Eyes Blurred vision?: No Double vision?: No  Ears/Nose/Throat Sore throat?: No Sinus problems?: No  Hematologic/Lymphatic Swollen glands?: No Easy bruising?: No  Cardiovascular Leg swelling?: No Chest pain?: No  Respiratory Cough?: No Shortness of breath?: No  Endocrine Excessive thirst?: No  Musculoskeletal Back pain?: No Joint pain?: No  Neurological Headaches?: No Dizziness?: No  Psychologic Depression?: No Anxiety?: No  Physical Exam: BP 138/79   Pulse 67   Resp 16   Ht 5\' 4"  (1.626 m)   Wt 132 lb 1.6 oz (59.9 kg)   SpO2 97%   BMI 22.67 kg/m   Constitutional: Well nourished. Alert and oriented, No acute distress. HEENT: [place] AT, moist mucus membranes. Trachea midline, no masses. Cardiovascular: No clubbing, cyanosis, or edema. Respiratory: Normal respiratory effort, no increased work of breathing. GI: Abdomen is soft, non tender, non distended, no abdominal masses. Liver and spleen not palpable.  No hernias appreciated.  Stool sample for occult testing is not indicated.   GU: No CVA tenderness.  No bladder fullness or masses.  Patient with circumcised phallus.  Urethral meatus is patent.   No penile discharge. No penile lesions or rashes. Scrotum without lesions, cysts, rashes and/or edema.  Testicles are located scrotally bilaterally. No masses are appreciated in the testicles. Left and right epididymis are normal. Rectal: Patient with  normal sphincter tone. Anus and perineum without scarring or rashes. No rectal masses are appreciated. Prostate is approximately 45 grams, no nodules are appreciated. Seminal vesicles are normal. Skin: No rashes, bruises or suspicious lesions. Lymph: No cervical or inguinal adenopathy. Neurologic: Grossly intact, no focal deficits, moving all 4 extremities. Psychiatric: Normal mood and affect.    Laboratory Data: Lab Results  Component Value Date   WBC 5.4 08/14/2017   HGB 12.4 (L) 08/14/2017   HCT 37.2 (L) 08/14/2017   MCV 94.2 08/14/2017   PLT 229 08/14/2017   Lab Results  Component Value Date   CREATININE 1.25 (H) 08/14/2017   Lab Results  Component Value Date   PSA 0.83 02/06/2015   Lab Results  Component Value Date   HGBA1C 6.5 03/28/2017   I have reviewed the labs.  Pertinent Imaging Results for orders placed or performed in visit on 11/22/17  Bladder Scan (Post Void Residual) in office  Result Value Ref Range   Scan Result 233    Assessment & Plan:    1. Nocturia:   He is still sleeping with BiPAP machine. He'll return in 6 months for I PSS.  2. BPH with LUTS  - IPSS score is 15/1, it is worsening - does not want to pursue further treatment  - Continue conservative management, avoiding bladder irritants and timed voiding's  - Continue finasteride 5 mg daily and tamsulosin 0.4 mg  - RTC in 6 months for IPSS and exam   3. SUI  - still not bothersome to the patient at this time  Return in about 6 months (around 05/22/2018) for I PSS  and exam.  Zara Council, [facility] [address] [place], [place] [address] [phone]

## 2017-12-07 ENCOUNTER — Ambulatory Visit: Payer: Medicare Other | Admitting: Neurology

## 2017-12-12 ENCOUNTER — Ambulatory Visit (INDEPENDENT_AMBULATORY_CARE_PROVIDER_SITE_OTHER): Payer: Medicare Other | Admitting: Internal Medicine

## 2017-12-12 VITALS — BP 134/64 | Temp 97.4°F | Wt 130.6 lb

## 2017-12-12 DIAGNOSIS — E119 Type 2 diabetes mellitus without complications: Secondary | ICD-10-CM

## 2017-12-12 DIAGNOSIS — I10 Essential (primary) hypertension: Secondary | ICD-10-CM | POA: Diagnosis not present

## 2017-12-12 DIAGNOSIS — E039 Hypothyroidism, unspecified: Secondary | ICD-10-CM | POA: Diagnosis not present

## 2017-12-12 DIAGNOSIS — J441 Chronic obstructive pulmonary disease with (acute) exacerbation: Secondary | ICD-10-CM | POA: Diagnosis not present

## 2017-12-12 DIAGNOSIS — E871 Hypo-osmolality and hyponatremia: Secondary | ICD-10-CM | POA: Diagnosis not present

## 2017-12-12 DIAGNOSIS — N138 Other obstructive and reflux uropathy: Secondary | ICD-10-CM

## 2017-12-12 DIAGNOSIS — D649 Anemia, unspecified: Secondary | ICD-10-CM

## 2017-12-12 DIAGNOSIS — N401 Enlarged prostate with lower urinary tract symptoms: Secondary | ICD-10-CM

## 2017-12-12 LAB — CBC WITH DIFFERENTIAL/PLATELET
BASOS PCT: 0.7 % (ref 0.0–3.0)
Basophils Absolute: 0 10*3/uL (ref 0.0–0.1)
EOS PCT: 3.4 % (ref 0.0–5.0)
Eosinophils Absolute: 0.2 10*3/uL (ref 0.0–0.7)
HCT: 37.5 % — ABNORMAL LOW (ref 39.0–52.0)
Hemoglobin: 12.4 g/dL — ABNORMAL LOW (ref 13.0–17.0)
LYMPHS ABS: 1.2 10*3/uL (ref 0.7–4.0)
Lymphocytes Relative: 18.4 % (ref 12.0–46.0)
MCHC: 33 g/dL (ref 30.0–36.0)
MCV: 94 fl (ref 78.0–100.0)
MONO ABS: 0.7 10*3/uL (ref 0.1–1.0)
Monocytes Relative: 11 % (ref 3.0–12.0)
NEUTROS PCT: 66.5 % (ref 43.0–77.0)
Neutro Abs: 4.2 10*3/uL (ref 1.4–7.7)
Platelets: 272 10*3/uL (ref 150.0–400.0)
RBC: 3.99 Mil/uL — AB (ref 4.22–5.81)
RDW: 13.4 % (ref 11.5–15.5)
WBC: 6.4 10*3/uL (ref 4.0–10.5)

## 2017-12-12 LAB — LIPID PANEL
CHOL/HDL RATIO: 2
Cholesterol: 177 mg/dL (ref 0–200)
HDL: 76.2 mg/dL (ref >39.00)
LDL Cholesterol: 88 mg/dL (ref 0–99)
NONHDL: 100.67
TRIGLYCERIDES: 61 mg/dL (ref 0.0–149.0)
VLDL: 12.2 mg/dL (ref 0.0–40.0)

## 2017-12-12 LAB — BASIC METABOLIC PANEL
BUN: 16 mg/dL (ref 6–23)
CO2: 34 mEq/L — ABNORMAL HIGH (ref 19–32)
CREATININE: 1.26 mg/dL (ref 0.40–1.50)
Calcium: 9.6 mg/dL (ref 8.4–10.5)
Chloride: 91 mEq/L — ABNORMAL LOW (ref 96–112)
GFR: 69.9 mL/min (ref >60.00)
GLUCOSE: 94 mg/dL (ref 70–99)
Potassium: 4.5 mEq/L (ref 3.5–5.1)
Sodium: 131 mEq/L — ABNORMAL LOW (ref 135–145)

## 2017-12-12 LAB — HEPATIC FUNCTION PANEL
ALBUMIN: 4.1 g/dL (ref 3.5–5.2)
ALT: 10 U/L (ref 0–53)
AST: 15 U/L (ref 0–37)
Alkaline Phosphatase: 63 U/L (ref 39–117)
Bilirubin, Direct: 0.1 mg/dL (ref 0.0–0.3)
TOTAL PROTEIN: 6.6 g/dL (ref 6.0–8.3)
Total Bilirubin: 0.4 mg/dL (ref 0.2–1.2)

## 2017-12-12 LAB — FERRITIN: FERRITIN: 69.2 ng/mL (ref 22.0–322.0)

## 2017-12-12 LAB — HEMOGLOBIN A1C: Hgb A1c MFr Bld: 6 % (ref 4.6–6.5)

## 2017-12-12 LAB — TSH: TSH: 1.48 u[IU]/mL (ref 0.35–4.50)

## 2017-12-12 NOTE — Progress Notes (Signed)
[name], male   DOB: 1930/02/06, 84 y.o.   MRN: [id number]   Subjective:    [name], male    DOB: 08/25/1930, 84 y.o.   MRN: [id number]  HPI  Patient here for his physical exam.  He is accompanied by his wife.  History obtained from both of them.  Using BiPAP.  Doing well with this.  Breathing better.  No chest pain.  Eating.  No nausea or vomiting.  No abdominal pain.  Bowels moving.  Saw cardiology 09/2017.  lexiscan - ok.  ECHO - ok.     Past Medical History:  Diagnosis Date  . Carpal tunnel syndrome   . COPD with asthma ([place])   . Diabetes mellitus    Diet control   . DJD (degenerative joint disease), cervical   . Duodenal ulcer, with partial obstruction 08/10/2012  . Essential and other specified forms of tremor 12/16/2013  . Hyperlipidemia   . Hypertension    no medicine needed  . IBS (irritable bowel syndrome)   . Internal hemorrhoids   . Memory deficit 12/16/2013  . Numbness and tingling in right hand    started 2 yeas ago  . Peptic ulcer   . Personal history of colonic polyps    adenomatous  . Polyneuropathy in diabetes(357.2)   . Tremor, essential   . Vitamin D deficiency    Past Surgical History:  Procedure Laterality Date  . ANAL FISSURE REPAIR    . ANKLE SURGERY Right    right- pins placed in  . COLONOSCOPY W/ BIOPSIES AND POLYPECTOMY  8/03, 6/05, 7/09, 9/10   internal hemorrhoids, tubular adenomas, mucosa & lymphoid nodules  . INGUINAL HERNIA REPAIR Right   . TOTAL HIP ARTHROPLASTY Right 11/13/2012   Procedure: TOTAL HIP ARTHROPLASTY ANTERIOR APPROACH;  Surgeon: Mcarthur Rossetti, MD;  Location: [facility];  Service: Orthopedics;  Laterality: Right;  Right total hip arthroplasty  . UPPER GASTROINTESTINAL ENDOSCOPY  3/05, 7/09, 9/10,2013   gastritis, duodenitis   Family History  Problem Relation Age of Onset  . Thyroid cancer Daughter 74  . Diabetes Son   . Urolithiasis Son   . Leukemia Maternal Aunt   . Colon  cancer Neg Hx   . Esophageal cancer Neg Hx   . Rectal cancer Neg Hx   . Stomach cancer Neg Hx   . Prostate cancer Neg Hx   . Kidney disease Neg Hx   . Kidney cancer Neg Hx   . Bladder Cancer Neg Hx    Social History   Socioeconomic History  . Marital status: Married    Spouse name: Not on file  . Number of children: 3  . Years of education: MA  . Highest education level: Not on file  Occupational History  . Occupation: [employer]    CommentScientist, clinical (histocompatibility and immunogenetics)  Social Needs  . Financial resource strain: Not on file  . Food insecurity:    Worry: Not on file    Inability: Not on file  . Transportation needs:    Medical: Not on file    Non-medical: Not on file  Tobacco Use  . Smoking status: Former Smoker    Types: Cigarettes    Last attempt to quit: 07/02/1963    Years since quitting: 54.4  . Smokeless tobacco: Never Used  Substance and Sexual Activity  . Alcohol use: No    Alcohol/week: 0.0 oz  . Drug use: No  . Sexual activity: Not on file  Lifestyle  . Physical activity:    Days per week: Not on file    Minutes per session: Not on file  . Stress: Not on file  Relationships  . Social connections:    Talks on phone: Not on file    Gets together: Not on file    Attends religious service: Not on file    Active member of club or organization: Not on file    Attends meetings of clubs or organizations: Not on file    Relationship status: Not on file  Other Topics Concern  . Not on file  Social History Narrative   Lives with wife    Caffeine use: 1 cup coffee per day   Right handed    Veteran [employer]    Outpatient Encounter Medications as of 12/12/2017  Medication Sig  . Cholecalciferol (VITAMIN D3) 50000 units CAPS Take 50,000 Units by mouth once a week.  . finasteride (PROSCAR) 5 MG tablet Take 1 tablet (5 mg total) by mouth daily.  . fluticasone (FLONASE) 50 MCG/ACT nasal spray Place 2 sprays into both nostrils daily.  Marland Kitchen levothyroxine (SYNTHROID,  LEVOTHROID) 50 MCG tablet Take 1 tablet (50 mcg total) by mouth daily before breakfast.  . losartan (COZAAR) 50 MG tablet TAKE 1 TABLET BY MOUTH ONCE DAILY  . magnesium oxide (MAG-OX) 400 (241.3 Mg) MG tablet Take 1 tablet (400 mg total) by mouth daily.  . pantoprazole (PROTONIX) 40 MG tablet TAKE 1 TABLET BY MOUTH ONCE DAILY  . PROAIR HFA 108 (90 Base) MCG/ACT inhaler INHALE 1-2 PUFFS INTO THE LUNGS EVERY 6 HOURS AS NEEDED FOR WHEEZING OR SHORTNESS OF BREATH  . saccharomyces boulardii (FLORASTOR) 250 MG capsule Take 250 mg by mouth as needed.   . tamsulosin (FLOMAX) 0.4 MG CAPS capsule Take 1 capsule (0.4 mg total) by mouth daily.  Marland Kitchen umeclidinium-vilanterol (ANORO ELLIPTA) 62.5-25 MCG/INH AEPB Inhale 1 puff into the lungs daily.  . [DISCONTINUED] furosemide (LASIX) 20 MG tablet Take 1 tablet (20 mg total) by mouth daily. (Patient taking differently: Take 40 mg by mouth daily. )   No facility-administered encounter medications on file as of 12/12/2017.     Review of Systems  Constitutional: Negative for appetite change.       Weight slightly decreased.    HENT: Negative for congestion and sinus pressure.   Respiratory: Negative for cough, chest tightness and shortness of breath.   Cardiovascular: Negative for chest pain, palpitations and leg swelling.  Gastrointestinal: Negative for abdominal pain, diarrhea, nausea and vomiting.  Genitourinary: Negative for difficulty urinating and dysuria.  Musculoskeletal: Negative for joint swelling, myalgias and neck stiffness.  Skin: Negative for color change and rash.  Neurological: Negative for dizziness, light-headedness and headaches.  Psychiatric/Behavioral: Negative for agitation and dysphoric mood.       Objective:    Physical Exam  Constitutional: He appears well-developed and well-nourished. No distress.  HENT:  Nose: Nose normal.  Mouth/Throat: Oropharynx is clear and moist.  Neck: Neck supple. No thyromegaly present.    Cardiovascular: Normal rate and regular rhythm.  Pulmonary/Chest: Effort normal and breath sounds normal. No respiratory distress.  Abdominal: Soft. Bowel sounds are normal. There is no tenderness.  Musculoskeletal: He exhibits no edema or tenderness.  Lymphadenopathy:    He has no cervical adenopathy.  Skin: No rash noted. No erythema.  Psychiatric: He has a normal mood and affect. His behavior is normal.    BP 134/64 (BP Location: Left Arm, Patient Position: Sitting, Cuff Size:  Normal)   Temp (!) 97.4 F (36.3 C) (Oral)   Wt 130 lb 9.6 oz (59.2 kg)   BMI 22.42 kg/m  Wt Readings from Last 3 Encounters:  12/16/17 130 lb (59 kg)  12/12/17 130 lb 9.6 oz (59.2 kg)  11/22/17 132 lb 1.6 oz (59.9 kg)     Lab Results  Component Value Date   WBC 6.6 12/16/2017   HGB 12.2 (L) 12/16/2017   HCT 35.9 (L) 12/16/2017   PLT 238 12/16/2017   GLUCOSE 151 (H) 12/16/2017   CHOL 177 12/12/2017   TRIG 61.0 12/12/2017   HDL 76.20 12/12/2017   LDLCALC 88 12/12/2017   ALT 11 (L) 12/16/2017   AST 25 12/16/2017   NA 132 (L) 12/16/2017   K 5.2 (H) 12/16/2017   CL 92 (L) 12/16/2017   CREATININE 1.30 (H) 12/16/2017   BUN 19 12/16/2017   CO2 30 12/16/2017   TSH 1.48 12/12/2017   PSA 0.83 02/06/2015   INR 0.94 11/06/2012   HGBA1C 6.0 12/12/2017   MICROALBUR 93.8 (H) 06/24/2015    Dg Chest 2 View  Result Date: 08/14/2017 CLINICAL DATA:  Generalized weakness and epigastric pain. EXAM: CHEST  2 VIEW COMPARISON:  03/25/2017 FINDINGS: The heart size and mediastinal contours are within normal limits. Both lungs are clear. There is re- demonstration of colonic interposition over the liver shadow. The visualized skeletal structures are unremarkable. IMPRESSION: No active cardiopulmonary disease. Electronically Signed   By: Ashley Royalty M.D.   On: 08/14/2017 21:19   Ct Abdomen Pelvis W Contrast  Result Date: 08/14/2017 CLINICAL DATA:  Epigastric pain with syncope.  Sinus bradycardia. EXAM: CT  ABDOMEN AND PELVIS WITH CONTRAST TECHNIQUE: Multidetector CT imaging of the abdomen and pelvis was performed using the standard protocol following bolus administration of intravenous contrast. CONTRAST:  117m ISOVUE-300 IOPAMIDOL (ISOVUE-300) INJECTION 61% COMPARISON:  None. FINDINGS: Lower chest: The included heart size is normal without pericardial effusion. There is left main and three-vessel coronary arteriosclerosis. There is mild-to-moderate atherosclerosis of the included lower thoracic aorta without aneurysm or dissection. Hepatobiliary: Layering gallstones are identified within the gallbladder without secondary signs of acute cholecystitis. Homogeneous enhancement of the liver without biliary dilatation or mass. Pancreas: Normal Spleen: Normal size spleen without mass. Adrenals/Urinary Tract: Normal bilateral adrenal glands. 17 mm right upper pole simple renal cyst with smaller cortical cysts seen on repeat delayed imaging within both kidneys. No obstructive uropathy or nephrolithiasis. Right-sided bladder diverticular are present with mild circumferential thickening of the urinary bladder possibly from chronic bladder outlet obstruction. This may be due to an enlarged prostate. Stomach/Bowel: Similar to prior, there are fluid-filled small and large bowel loops with distention suggesting dysmotility or ileus. No mechanical source of obstruction is identified. Appendix is not definitively identified but no pericecal inflammation to suggest appendicitis. Vascular/Lymphatic: Mild to moderate aortoiliac atherosclerosis without aneurysm or dissection. Reproductive: The prostate appears enlarged though suboptimally assessed otherwise due to metallic streak artifacts from the patient's adjacent right hip arthroplasty. Other: Fatty atrophy of the abdominal wall musculature. No focal herniation. Musculoskeletal: Right hip arthroplasty. Multilevel degenerative disc disease with vacuum disc phenomena along the lower  lumbar spine from L2 through L5 consistent with spondylosis. IMPRESSION: 1. Diffuse small and large bowel gaseous distention without mechanical source of obstruction consistent with ileus or dysmotility. 2. Left main and three-vessel coronary arteriosclerosis. 3. Aortoiliac atherosclerosis without aneurysm. 4. Bilateral renal cysts the largest is in the upper pole the right kidney measuring 17 mm. No obstructive uropathy.  5. Mild diffuse bladder wall thickening with right-sided bladder diverticula. This in conjunction with an enlarged prostate may represent stigmata of chronic bladder outlet obstruction. Electronically Signed   By: Ashley Royalty M.D.   On: 08/14/2017 22:49       Assessment & Plan:   Problem List Items Addressed This Visit    Anemia    Follow cbc.       Relevant Orders   CBC with Differential/Platelet (Completed)   Ferritin (Completed)   BPH with obstruction/lower urinary tract symptoms    Followed by urology.       COPD (chronic obstructive pulmonary disease) ([place])    Followed by Dr Alva Garnet.  Uses BiPAP every night.  Breathing better.  Follow.        Diabetes mellitus, type II ([place]) - Primary    Low carb diet and exercise.  Follow met b and a1c.        Relevant Orders   Hemoglobin A1c (Completed)   Hepatic function panel (Completed)   Basic metabolic panel (Completed)   Hypertension    Blood pressure under good control.  Continue same medication regimen.  Follow pressures.  Follow metabolic panel.        Relevant Orders   Lipid panel (Completed)   Hyponatremia    Recheck sodium.  Has been stable.  Previously worked up by nephrology.        Hypothyroidism    On thyroid replacement.  Follow tsh.        Relevant Orders   TSH (Completed)       [name], MD

## 2017-12-16 ENCOUNTER — Emergency Department: Payer: Medicare Other

## 2017-12-16 ENCOUNTER — Observation Stay
Admission: EM | Admit: 2017-12-16 | Discharge: 2017-12-17 | Disposition: A | Discharge status: Home / Self Care | Payer: Medicare Other | Attending: Internal Medicine | Admitting: Internal Medicine

## 2017-12-16 ENCOUNTER — Encounter: Payer: Self-pay | Admitting: Internal Medicine

## 2017-12-16 DIAGNOSIS — J449 Chronic obstructive pulmonary disease, unspecified: Secondary | ICD-10-CM | POA: Insufficient documentation

## 2017-12-16 DIAGNOSIS — I10 Essential (primary) hypertension: Secondary | ICD-10-CM | POA: Insufficient documentation

## 2017-12-16 DIAGNOSIS — K560 Paralytic ileus: Secondary | ICD-10-CM

## 2017-12-16 DIAGNOSIS — E785 Hyperlipidemia, unspecified: Secondary | ICD-10-CM | POA: Insufficient documentation

## 2017-12-16 DIAGNOSIS — Z79890 Hormone replacement therapy (postmenopausal): Secondary | ICD-10-CM | POA: Diagnosis not present

## 2017-12-16 DIAGNOSIS — R1084 Generalized abdominal pain: Secondary | ICD-10-CM | POA: Insufficient documentation

## 2017-12-16 DIAGNOSIS — K567 Ileus, unspecified: Principal | ICD-10-CM | POA: Insufficient documentation

## 2017-12-16 DIAGNOSIS — Z888 Allergy status to other drugs, medicaments and biological substances status: Secondary | ICD-10-CM | POA: Diagnosis not present

## 2017-12-16 DIAGNOSIS — E1142 Type 2 diabetes mellitus with diabetic polyneuropathy: Secondary | ICD-10-CM | POA: Diagnosis not present

## 2017-12-16 DIAGNOSIS — N400 Enlarged prostate without lower urinary tract symptoms: Secondary | ICD-10-CM | POA: Insufficient documentation

## 2017-12-16 DIAGNOSIS — E559 Vitamin D deficiency, unspecified: Secondary | ICD-10-CM | POA: Diagnosis not present

## 2017-12-16 DIAGNOSIS — E039 Hypothyroidism, unspecified: Secondary | ICD-10-CM | POA: Diagnosis not present

## 2017-12-16 DIAGNOSIS — Z79899 Other long term (current) drug therapy: Secondary | ICD-10-CM | POA: Insufficient documentation

## 2017-12-16 DIAGNOSIS — Z91040 Latex allergy status: Secondary | ICD-10-CM | POA: Diagnosis not present

## 2017-12-16 DIAGNOSIS — E119 Type 2 diabetes mellitus without complications: Secondary | ICD-10-CM

## 2017-12-16 LAB — COMPREHENSIVE METABOLIC PANEL
ALK PHOS: 61 U/L (ref 38–126)
ALT: 11 U/L — ABNORMAL LOW (ref 17–63)
ANION GAP: 10 (ref 5–15)
AST: 25 U/L (ref 15–41)
Albumin: 3.6 g/dL (ref 3.5–5.0)
BUN: 19 mg/dL (ref 6–20)
CALCIUM: 8.8 mg/dL — AB (ref 8.9–10.3)
CO2: 30 mmol/L (ref 22–32)
Chloride: 92 mmol/L — ABNORMAL LOW (ref 101–111)
Creatinine, Ser: 1.3 mg/dL — ABNORMAL HIGH (ref 0.61–1.24)
GFR calc non Af Amer: 48 mL/min — ABNORMAL LOW (ref >60)
GFR, EST AFRICAN AMERICAN: 56 mL/min — AB (ref >60)
Glucose, Bld: 151 mg/dL — ABNORMAL HIGH (ref 65–99)
Potassium: 5.2 mmol/L — ABNORMAL HIGH (ref 3.5–5.1)
Sodium: 132 mmol/L — ABNORMAL LOW (ref 135–145)
Total Bilirubin: 0.4 mg/dL (ref 0.3–1.2)
Total Protein: 6.5 g/dL (ref 6.5–8.1)

## 2017-12-16 LAB — CBC
HCT: 35.9 % — ABNORMAL LOW (ref 40.0–52.0)
HEMOGLOBIN: 12.2 g/dL — AB (ref 13.0–18.0)
MCH: 32 pg (ref 26.0–34.0)
MCHC: 34 g/dL (ref 32.0–36.0)
MCV: 94.1 fL (ref 80.0–100.0)
Platelets: 238 10*3/uL (ref 150–440)
RBC: 3.81 MIL/uL — AB (ref 4.40–5.90)
RDW: 13.4 % (ref 11.5–14.5)
WBC: 6.6 10*3/uL (ref 3.8–10.6)

## 2017-12-16 LAB — URINALYSIS, COMPLETE (UACMP) WITH MICROSCOPIC
BILIRUBIN URINE: NEGATIVE
Bacteria, UA: NONE SEEN
Glucose, UA: NEGATIVE mg/dL
HGB URINE DIPSTICK: NEGATIVE
KETONES UR: NEGATIVE mg/dL
LEUKOCYTES UA: NEGATIVE
NITRITE: NEGATIVE
PROTEIN: NEGATIVE mg/dL
Specific Gravity, Urine: 1.027 (ref 1.005–1.030)
Squamous Epithelial / LPF: NONE SEEN
pH: 7 (ref 5.0–8.0)

## 2017-12-16 LAB — LIPASE, BLOOD: LIPASE: 41 U/L (ref 11–51)

## 2017-12-16 MED ORDER — IOPAMIDOL (ISOVUE-300) INJECTION 61%
100.0000 mL | Freq: Once | INTRAVENOUS | Status: AC | PRN
  Administered 2017-12-16: 100 mL via INTRAVENOUS

## 2017-12-16 MED ORDER — IOPAMIDOL (ISOVUE-300) INJECTION 61%
30.0000 mL | Freq: Once | INTRAVENOUS | Status: AC
  Administered 2017-12-16: 30 mL via ORAL

## 2017-12-16 MED ORDER — MORPHINE SULFATE (PF) 2 MG/ML IV SOLN
2.0000 mg | Freq: Once | INTRAVENOUS | Status: AC
  Administered 2017-12-16: 2 mg via INTRAVENOUS
  Filled 2017-12-16: qty 1

## 2017-12-16 MED ORDER — MORPHINE SULFATE (PF) 4 MG/ML IV SOLN
4.0000 mg | Freq: Once | INTRAVENOUS | Status: AC
  Administered 2017-12-16: 4 mg via INTRAVENOUS
  Filled 2017-12-16: qty 1

## 2017-12-16 MED ORDER — ONDANSETRON HCL 4 MG/2ML IJ SOLN
4.0000 mg | Freq: Once | INTRAMUSCULAR | Status: AC
  Administered 2017-12-16: 4 mg via INTRAVENOUS
  Filled 2017-12-16: qty 2

## 2017-12-16 MED ORDER — SODIUM CHLORIDE 0.9 % IV BOLUS (SEPSIS)
1000.0000 mL | Freq: Once | INTRAVENOUS | Status: AC
  Administered 2017-12-16: 1000 mL via INTRAVENOUS

## 2017-12-16 NOTE — Assessment & Plan Note (Signed)
Low carb diet and exercise.  Follow met b and a1c.   

## 2017-12-16 NOTE — ED Provider Notes (Addendum)
[facility] Emergency Department Provider Note  Time seen: 6:56 PM  I have reviewed the triage vital signs and the nursing notes.   HISTORY  Chief Complaint Abdominal Pain    HPI Aaron Hendrix is a 84 y.o. male with a past medical history of COPD, diabetes, hypertension, hyperlipidemia, presents to the emergency department for abdominal pain.  According to the patient he was feeling fine until around 2 PM today when he developed fairly sudden onset of abdominal pain which she was ranking is a 10/10 in severity mostly across the upper abdomen.  Denies any vomiting or diarrhea, states he had a normal bowel movement earlier today.  Denies dysuria.  Denies any recent fever, cough, congestion chest pain, shortness of breath largely negative review of systems otherwise.  Patient received fentanyl by EMS prior to arrival, now rates his pain as a 7/10 during my evaluation.  Past Medical History:  Diagnosis Date  . Carpal tunnel syndrome   . COPD with asthma ([place])   . Diabetes mellitus    Diet control   . DJD (degenerative joint disease), cervical   . Duodenal ulcer, with partial obstruction 08/10/2012  . Essential and other specified forms of tremor 12/16/2013  . Hyperlipidemia   . Hypertension    no medicine needed  . IBS (irritable bowel syndrome)   . Internal hemorrhoids   . Memory deficit 12/16/2013  . Numbness and tingling in right hand    started 2 yeas ago  . Peptic ulcer   . Personal history of colonic polyps    adenomatous  . Polyneuropathy in diabetes(357.2)   . Tremor, essential   . Vitamin D deficiency     Patient Active Problem List   Diagnosis Date Noted  . Anemia 12/12/2017  . Healthcare maintenance 09/14/2017  . Confusion 03/31/2017  . Respiratory failure with hypoxia and hypercapnia ([place]) 03/19/2017  . Palliative care by specialist 03/09/2017  . Hypercapnia   . Acute on chronic respiratory failure with hypercapnia ([place]) 03/08/2017   . Hypoxia 11/25/2016  . COPD exacerbation ([place]) 11/25/2016  . Rash 01/18/2016  . Nocturia 08/11/2015  . BPH with obstruction/lower urinary tract symptoms 08/11/2015  . Hematemesis 07/29/2015  . Paralytic ileus ([place]) 07/29/2015  . Lower extremity edema 07/16/2015  . Increased urinary frequency 07/16/2015  . COPD (chronic obstructive pulmonary disease) ([place]) 07/16/2015  . Community acquired pneumonia   . Hyponatremia 06/25/2015  . Skin lesion 02/16/2015  . DNR (do not resuscitate) discussion 02/16/2015  . Elbow abrasion 09/27/2014  . Hypothyroidism 09/27/2014  . Chronic constipation 09/01/2014  . Trigger thumb 06/22/2014  . Abdominal pain 06/22/2014  . Personal history of other specified conditions 02/20/2014  . Decreased anal sphincter tone 02/19/2014  . Other specified symptoms and signs involving the digestive system and abdomen 02/19/2014  . Diabetes mellitus ([place]) 02/19/2014  . Memory deficit 12/16/2013  . Essential and other specified forms of tremor 12/16/2013  . Amnesia 12/16/2013  . Diarrhea 10/24/2013  . Loss of weight 09/10/2013  . Periumbilical abdominal pain 09/10/2013  . Pain in lower limb 07/01/2013  . Dermatophytosis of foot 07/01/2013  . Degenerative arthritis of hip 11/13/2012  . Osteoarthritis of hip 11/13/2012  . DU (duodenal ulcer) 08/22/2012  . Nausea 08/10/2012  . Hypertension 05/25/2011  . Diabetes mellitus, type II ([place]) 05/25/2011  . Irritable bowel syndrome 03/31/2008  . History of colonic polyps 03/31/2008  . Adaptive colitis 03/31/2008    Past Surgical History:  Procedure Laterality Date  .  ANAL FISSURE REPAIR    . ANKLE SURGERY Right    right- pins placed in  . COLONOSCOPY W/ BIOPSIES AND POLYPECTOMY  8/03, 6/05, 7/09, 9/10   internal hemorrhoids, tubular adenomas, mucosa & lymphoid nodules  . INGUINAL HERNIA REPAIR Right   . TOTAL HIP ARTHROPLASTY Right 11/13/2012   Procedure: TOTAL HIP ARTHROPLASTY ANTERIOR APPROACH;  Surgeon:  Mcarthur Rossetti, MD;  Location: [place];  Service: Orthopedics;  Laterality: Right;  Right total hip arthroplasty  . UPPER GASTROINTESTINAL ENDOSCOPY  3/05, 7/09, 9/10,2013   gastritis, duodenitis    Prior to Admission medications   Medication Sig Start Date End Date Taking? Authorizing Provider  Cholecalciferol (VITAMIN D3) 50000 units CAPS Take 50,000 Units by mouth once a week.    [provider]  finasteride (PROSCAR) 5 MG tablet Take 1 tablet (5 mg total) by mouth daily. 11/22/17   Zara Council A, PA-C  fluticasone (FLONASE) 50 MCG/ACT nasal spray Place 2 sprays into both nostrils daily. 09/12/17   [name], MD  furosemide (LASIX) 20 MG tablet Take 1 tablet (20 mg total) by mouth daily. Patient taking differently: Take 40 mg by mouth daily.  07/22/15   Max Sane, MD  levothyroxine (SYNTHROID, LEVOTHROID) 50 MCG tablet Take 1 tablet (50 mcg total) by mouth daily before breakfast. 12/02/16   [name], MD  losartan (COZAAR) 50 MG tablet TAKE 1 TABLET BY MOUTH ONCE DAILY 04/26/17   [name], MD  magnesium oxide (MAG-OX) 400 (241.3 Mg) MG tablet Take 1 tablet (400 mg total) by mouth daily. 05/24/17   [name], MD  pantoprazole (PROTONIX) 40 MG tablet TAKE 1 TABLET BY MOUTH ONCE DAILY 09/11/17   [name], MD  PROAIR HFA 108 [phone] Base) MCG/ACT inhaler INHALE 1-2 PUFFS INTO THE LUNGS EVERY 6 HOURS AS NEEDED FOR WHEEZING OR SHORTNESS OF BREATH 09/11/17   [name], MD  saccharomyces boulardii (FLORASTOR) 250 MG capsule Take 250 mg by mouth as needed.     [provider]  tamsulosin (FLOMAX) 0.4 MG CAPS capsule Take 1 capsule (0.4 mg total) by mouth daily. 11/22/17   [name], [name], PA-C  umeclidinium-vilanterol (ANORO ELLIPTA) 62.5-25 MCG/INH AEPB Inhale 1 puff into the lungs daily. 04/03/17   [name], MD    Allergies  Allergen Reactions  . Fexofenadine Other (See Comments)  . Quinapril Hcl Other (See Comments)    Unknown    . Latex Rash    Family History  Problem Relation Age of Onset  . Thyroid cancer Daughter 96  . Diabetes Son   . Urolithiasis Son   . Leukemia Maternal Aunt   . Colon cancer Neg Hx   . Esophageal cancer Neg Hx   . Rectal cancer Neg Hx   . Stomach cancer Neg Hx   . Prostate cancer Neg Hx   . Kidney disease Neg Hx   . Kidney cancer Neg Hx   . Bladder Cancer Neg Hx     Social History Social History   Tobacco Use  . Smoking status: Former Smoker    Types: Cigarettes    Last attempt to quit: 07/02/1963    Years since quitting: 54.4  . Smokeless tobacco: Never Used  Substance Use Topics  . Alcohol use: No    Alcohol/week: 0.0 oz  . Drug use: No    Review of Systems Constitutional: Negative for fever. Eyes: Negative for visual complaints ENT: Negative for recent illness/congestion Cardiovascular: Negative for chest pain. Respiratory: Negative  for shortness of breath. Gastrointestinal: Positive for fairly diffuse abdominal pain, worse across the upper abdomen.  Negative for nausea vomiting or diarrhea.  Normal bowel movement today per patient. Genitourinary: Negative for dysuria. Musculoskeletal: Negative for musculoskeletal complaints Skin: Negative for skin complaints  Neurological: Negative for headache All other ROS negative  ____________________________________________   PHYSICAL EXAM:  VITAL SIGNS: ED Triage Vitals [12/16/17 1816]  Enc Vitals Group     BP (!) 136/45     Pulse Rate 66     Resp 18     Temp 98 F (36.7 C)     Temp Source Oral     SpO2      Weight 130 lb (59 kg)     Height      Head Circumference      Peak Flow      Pain Score 6     Pain Loc      Pain Edu?      Excl. in [place]?    Constitutional: Alert and oriented.  Moderate distress holding his abdomen appears to be in pain. Eyes: Normal exam ENT   Head: Normocephalic and atraumatic.   Mouth/Throat: Mucous membranes are moist. Cardiovascular: Normal rate, regular rhythm. No  murmur Respiratory: Normal respiratory effort without tachypnea nor retractions. Breath sounds are clear  Gastrointestinal: Mild distention, tympanic percussion throughout, moderate diffuse abdominal tenderness, no focal tenderness identified.  No rebound, mild guarding. Musculoskeletal: Nontender with normal range of motion in all extremities. No lower extremity tenderness or edema. Neurologic:  Normal speech and language. No gross focal neurologic deficits Skin:  Skin is warm, dry and intact.  Psychiatric: Mood and affect are normal.  ____________________________________________   RADIOLOGY  Patient has apparent ileus.  No free air.  EKG reviewed and interpreted by myself shows sinus rhythm at 70 bpm with a slightly widened QRS, normal axis, largely normal intervals with nonspecific ST changes but no ST elevation. ____________________________________________   INITIAL IMPRESSION / ASSESSMENT AND PLAN / ED COURSE  Pertinent labs & imaging results that were available during my care of the patient were reviewed by me and considered in my medical decision making (see chart for details).  Patient presents to the emergency department for abdominal pain and distention starting around 2 PM today.  Differential is quite broad but would include bowel perforation/free air, bowel obstruction, constipation, intra-abdominal pathology such as appendicitis or cholecystitis.  We will check labs, obtain acute abdominal series, CT scan of the abdomen.  Patient's labs have resulted with a normal white blood cell count, normal lipase.  Mild hyperkalemia but there was hemolysis, mild renal insufficiency.  We will treat pain, nausea, IV hydrate while awaiting imaging.  Patient agreeable to this plan of care.  Family agreeable to care.  I reviewed the x-ray which does appear consistent with an ileus, reviewed CT imaging we are currently awaiting CT read by radiology but it appears to show ileus versus  obstruction, given the patient's significant intestinal distention we will place an NG tube is set to low intermittent suction.  Awaiting CT read then will discuss with appropriate service for admission to the hospital.  Continues to have abdominal pain although improved after pain medication.  If again delay in obtaining CT imaging due to reads not coming through from packs to epic.  Discussed with radiology they have faxed a manual report.  Discussed with surgery they will be down to see the patient.  No clear SBO, likely ileus.  Surgery will be  admitting to their service for further treatment.  NG in place.  Surgery has seen, patient has had several ileus admissions in the past per surgery they are not required operative management.  They recommend admission to the hospitalist service and they would be happy to consult and follow along.  Discussed the patient with the hospitalist who is agreeable to this plan of care.  ____________________________________________   FINAL CLINICAL IMPRESSION(S) / ED DIAGNOSES  Abdominal pain Ileus   Harvest Dark, MD 12/16/17 2231    Harvest Dark, MD 12/16/17 2253

## 2017-12-16 NOTE — Assessment & Plan Note (Signed)
Follow cbc.  

## 2017-12-16 NOTE — H&P (Signed)
[facility] NAME: Aaron Hendrix    MR#:  [id number]  DATE OF BIRTH:  10/30/1929  DATE OF ADMISSION:  12/16/2017  PRIMARY CARE PHYSICIAN: [name], MD   REQUESTING/REFERRING PHYSICIAN: [name], MD  CHIEF COMPLAINT:   Chief Complaint  Patient presents with  . Abdominal Pain    HISTORY OF PRESENT ILLNESS:  Aaron Hendrix  is a 84 y.o. male who presents with acute onset abdominal pain.  Patient states that he had a normal bowel movement this morning, but this afternoon began to have significant abdominal distention.  He has had ileus in the past.  He was seen by surgery in the ED, and they do not feel like he requires surgery at this time.  NG tube was placed and hospitalist were called for admission.  PAST MEDICAL HISTORY:   Past Medical History:  Diagnosis Date  . Carpal tunnel syndrome   . COPD with asthma ([place])   . Diabetes mellitus    Diet control   . DJD (degenerative joint disease), cervical   . Duodenal ulcer, with partial obstruction 08/10/2012  . Essential and other specified forms of tremor 12/16/2013  . Hyperlipidemia   . Hypertension    no medicine needed  . IBS (irritable bowel syndrome)   . Internal hemorrhoids   . Memory deficit 12/16/2013  . Numbness and tingling in right hand    started 2 yeas ago  . Peptic ulcer   . Personal history of colonic polyps    adenomatous  . Polyneuropathy in diabetes(357.2)   . Tremor, essential   . Vitamin D deficiency      PAST SURGICAL HISTORY:   Past Surgical History:  Procedure Laterality Date  . ANAL FISSURE REPAIR    . ANKLE SURGERY Right    right- pins placed in  . COLONOSCOPY W/ BIOPSIES AND POLYPECTOMY  8/03, 6/05, 7/09, 9/10   internal hemorrhoids, tubular adenomas, mucosa & lymphoid nodules  . INGUINAL HERNIA REPAIR Right   . TOTAL HIP ARTHROPLASTY Right 11/13/2012   Procedure: TOTAL HIP ARTHROPLASTY ANTERIOR APPROACH;  Surgeon:  Mcarthur Rossetti, MD;  Location: [facility];  Service: Orthopedics;  Laterality: Right;  Right total hip arthroplasty  . UPPER GASTROINTESTINAL ENDOSCOPY  3/05, 7/09, 9/10,2013   gastritis, duodenitis     SOCIAL HISTORY:   Social History   Tobacco Use  . Smoking status: Former Smoker    Types: Cigarettes    Last attempt to quit: 07/02/1963    Years since quitting: 54.4  . Smokeless tobacco: Never Used  Substance Use Topics  . Alcohol use: No    Alcohol/week: 0.0 oz     FAMILY HISTORY:   Family History  Problem Relation Age of Onset  . Thyroid cancer Daughter 19  . Diabetes Son   . Urolithiasis Son   . Leukemia Maternal Aunt   . Colon cancer Neg Hx   . Esophageal cancer Neg Hx   . Rectal cancer Neg Hx   . Stomach cancer Neg Hx   . Prostate cancer Neg Hx   . Kidney disease Neg Hx   . Kidney cancer Neg Hx   . Bladder Cancer Neg Hx      DRUG ALLERGIES:   Allergies  Allergen Reactions  . Fexofenadine Other (See Comments)  . Quinapril Hcl Other (See Comments)    Unknown   . Latex Rash    MEDICATIONS AT HOME:   Prior to Admission medications  Medication Sig Start Date End Date Taking? Authorizing Provider  Cholecalciferol (VITAMIN D3) 50000 units CAPS Take 50,000 Units by mouth once a week.   Yes [provider]  finasteride (PROSCAR) 5 MG tablet Take 1 tablet (5 mg total) by mouth daily. 11/22/17  Yes [name], [name], PA-C  fluticasone (FLONASE) 50 MCG/ACT nasal spray Place 2 sprays into both nostrils daily. 09/12/17  Yes [name], MD  furosemide (LASIX) 40 MG tablet Take 40 mg by mouth daily.   Yes [provider]  levothyroxine (SYNTHROID, LEVOTHROID) 50 MCG tablet Take 1 tablet (50 mcg total) by mouth daily before breakfast. 12/02/16  Yes [name], MD  losartan (COZAAR) 50 MG tablet TAKE 1 TABLET BY MOUTH ONCE DAILY 04/26/17  Yes [name], MD  magnesium oxide (MAG-OX) 400 (241.3 Mg) MG tablet Take 1 tablet (400 mg total)  by mouth daily. 05/24/17  Yes [name], MD  pantoprazole (PROTONIX) 40 MG tablet TAKE 1 TABLET BY MOUTH ONCE DAILY 09/11/17  Yes [name], MD  PROAIR HFA 108 [phone] Base) MCG/ACT inhaler INHALE 1-2 PUFFS INTO THE LUNGS EVERY 6 HOURS AS NEEDED FOR WHEEZING OR SHORTNESS OF BREATH 09/11/17  Yes [name], MD  saccharomyces boulardii (FLORASTOR) 250 MG capsule Take 250 mg by mouth as needed.    Yes [provider]  tamsulosin (FLOMAX) 0.4 MG CAPS capsule Take 1 capsule (0.4 mg total) by mouth daily. 11/22/17  Yes [name], [name], PA-C  umeclidinium-vilanterol (ANORO ELLIPTA) 62.5-25 MCG/INH AEPB Inhale 1 puff into the lungs daily. 04/03/17  Yes [name], MD    REVIEW OF SYSTEMS:  Review of Systems  Constitutional: Negative for chills, fever, malaise/fatigue and weight loss.  HENT: Negative for ear pain, hearing loss and tinnitus.   Eyes: Negative for blurred vision, double vision, pain and redness.  Respiratory: Negative for cough, hemoptysis and shortness of breath.   Cardiovascular: Negative for chest pain, palpitations, orthopnea and leg swelling.  Gastrointestinal: Positive for abdominal pain. Negative for constipation, diarrhea, nausea and vomiting.  Genitourinary: Negative for dysuria, frequency and hematuria.  Musculoskeletal: Negative for back pain, joint pain and neck pain.  Skin:       No acne, rash, or lesions  Neurological: Negative for dizziness, tremors, focal weakness and weakness.  Endo/Heme/Allergies: Negative for polydipsia. Does not bruise/bleed easily.  Psychiatric/Behavioral: Negative for depression. The patient is not nervous/anxious and does not have insomnia.      VITAL SIGNS:   Vitals:   12/16/17 1900 12/16/17 2130 12/16/17 2200 12/16/17 2230  BP: 114/67 126/61 113/67 (!) 127/97  Pulse: (!) 50 (!) 57 77 84  Resp: 18 18 18  (!) 32  Temp:      TempSrc:      SpO2: 100% 100% 92% 99%  Weight:       Wt Readings from Last 3  Encounters:  12/16/17 59 kg (130 lb)  12/12/17 59.2 kg (130 lb 9.6 oz)  11/22/17 59.9 kg (132 lb 1.6 oz)    PHYSICAL EXAMINATION:  Physical Exam  Vitals reviewed. Constitutional: He is oriented to person, place, and time. He appears well-developed and well-nourished. No distress.  HENT:  Head: Normocephalic and atraumatic.  Mouth/Throat: Oropharynx is clear and moist.  Eyes: Pupils are equal, round, and reactive to light. Conjunctivae and EOM are normal. No scleral icterus.  Neck: Normal range of motion. Neck supple. No JVD present. No thyromegaly present.  Cardiovascular: Normal rate, regular rhythm and intact distal pulses. Exam reveals no  gallop and no friction rub.  No murmur heard. Respiratory: Effort normal and breath sounds normal. No respiratory distress. He has no wheezes. He has no rales.  GI: Soft. He exhibits distension. There is tenderness.  Musculoskeletal: Normal range of motion. He exhibits no edema.  No arthritis, no gout  Lymphadenopathy:    He has no cervical adenopathy.  Neurological: He is alert and oriented to person, place, and time. No cranial nerve deficit.  No dysarthria, no aphasia  Skin: Skin is warm and dry. No rash noted. No erythema.  Psychiatric: He has a normal mood and affect. His behavior is normal. Judgment and thought content normal.    LABORATORY PANEL:   CBC Recent Labs  Lab 12/16/17 1820  WBC 6.6  HGB 12.2*  HCT 35.9*  PLT 238   ------------------------------------------------------------------------------------------------------------------  Chemistries  Recent Labs  Lab 12/16/17 1820  NA 132*  K 5.2*  CL 92*  CO2 30  GLUCOSE 151*  BUN 19  CREATININE 1.30*  CALCIUM 8.8*  AST 25  ALT 11*  ALKPHOS 61  BILITOT 0.4   ------------------------------------------------------------------------------------------------------------------  Cardiac Enzymes No results for input(s): TROPONINI in the last 168  hours. ------------------------------------------------------------------------------------------------------------------  RADIOLOGY:  Dg Abdomen Acute W/chest  Result Date: 12/16/2017 CLINICAL DATA:  Abdominal pain. EXAM: DG ABDOMEN ACUTE W/ 1V CHEST COMPARISON:  CT of the abdomen and pelvis August 14, 2017 and chest x-ray August 14, 2017 FINDINGS: Air under the right left diaphragm is consistent with air in colon and stomach. This is been seen on previous studies. No pneumothorax. No pulmonary nodules or masses. No focal infiltrates. The cardiomediastinal silhouette is normal. No free air identified. Air-filled prominent loops of large and small bowel suggest ileus rather than obstruction. No other acute abnormalities identified. IMPRESSION: Apparent ileus in the abdomen.  No other acute abnormalities. Electronically Signed   By: Dorise Bullion III M.D   On: 12/16/2017 19:33    EKG:   Orders placed or performed during the hospital encounter of 12/16/17  . ED EKG  . ED EKG    IMPRESSION AND PLAN:  Principal Problem:   Ileus ([place]) -NG placed with significant improvement in symptoms.  We will continue this for now, PRN analgesia for discomfort though we will try to use minimal narcotics.  Surgery consult follow along. Active Problems:   Hypertension -stable, we are holding p.o. meds for tonight, continue home meds once he is able to tolerate some p.o.   COPD (chronic obstructive pulmonary disease) (HCC) -home dose inhalers   Diabetes mellitus (HCC) -sliding scale insulin with corresponding glucose checks   Hypothyroidism -home dose thyroid replacement once he is able to take p.o.  Chart review performed and case discussed with ED provider. Labs, imaging and/or ECG reviewed by provider and discussed with patient/family. Management plans discussed with the patient and/or family.  DVT PROPHYLAXIS: SubQ lovenox  GI PROPHYLAXIS: None  ADMISSION STATUS: Inpatient  CODE STATUS:  Full Code Status History    Date Active Date Inactive Code Status Order ID Comments User Context   03/19/2017 1602 03/23/2017 1744 Full Code [id number]  Fritzi Mandes, MD Inpatient   03/08/2017 1559 03/11/2017 1504 Full Code [id number]  Fritzi Mandes, MD Inpatient   11/25/2016 2044 11/27/2016 1455 Full Code [id number]  Demetrios Loll, MD Inpatient   07/16/2015 1508 07/22/2015 1744 Full Code [id number]  Hillary Bow, MD ED   06/25/2015 2014 07/06/2015 1757 Full Code [id number]  Gladstone Lighter, MD Inpatient   11/13/2012 1741 11/16/2012 1337 Full Code  [id number]  Mcarthur Rossetti, MD Inpatient      TOTAL TIME TAKING CARE OF THIS PATIENT: 45 minutes.   Jannifer Franklin, Jilberto Vanderwall [place] 12/16/2017, 11:45 PM  [employer]  [phone]  CC: Primary care physician; [name], MD  Note:  This document was prepared using Dragon voice recognition software and may include unintentional dictation errors.

## 2017-12-16 NOTE — Assessment & Plan Note (Signed)
Blood pressure under good control.  Continue same medication regimen.  Follow pressures.  Follow metabolic panel.   

## 2017-12-16 NOTE — Assessment & Plan Note (Signed)
Followed by urology.   

## 2017-12-16 NOTE — Assessment & Plan Note (Signed)
On thyroid replacement.  Follow tsh.  

## 2017-12-16 NOTE — ED Notes (Signed)
Pt reporting to this RN that his pain is not getting better.  EDP notified and EDP states that surgeon is to come and talk to patient and family.

## 2017-12-16 NOTE — Consult Note (Signed)
Date of Consultation:  12/16/2017  Requesting Physician:  Harvest Dark, MD  Reason for Consultation:  Abdominal pain  History of Present Illness: Aaron Hendrix is a 84 y.o. male who presents with a one day history of bilateral mid abdominal pain.  He has prior history of small and large bowel ileus and has had prior CT scans in the system with this.  He had been doing ok until this early afternoon when the pain started.  Denies any nausea or vomiting and reports having a bowel movement earlier today.  Has not tried anything at home for the pain.  He feels distended as well.  Discussing with his daughter and wife, these symptoms are the same as his previous episodes.  His last CT prior to today had been 07/2017, but ileus is also noted on CT in 2015 and 2014.  He has had an inguinal hernia repair in the past, but no intra-abdominal surgeries.  Past Medical History: Past Medical History:  Diagnosis Date  . Carpal tunnel syndrome   . COPD with asthma ([place])   . Diabetes mellitus    Diet control   . DJD (degenerative joint disease), cervical   . Duodenal ulcer, with partial obstruction 08/10/2012  . Essential and other specified forms of tremor 12/16/2013  . Hyperlipidemia   . Hypertension    no medicine needed  . IBS (irritable bowel syndrome)   . Internal hemorrhoids   . Memory deficit 12/16/2013  . Numbness and tingling in right hand    started 2 yeas ago  . Peptic ulcer   . Personal history of colonic polyps    adenomatous  . Polyneuropathy in diabetes(357.2)   . Tremor, essential   . Vitamin D deficiency      Past Surgical History: Past Surgical History:  Procedure Laterality Date  . ANAL FISSURE REPAIR    . ANKLE SURGERY Right    right- pins placed in  . COLONOSCOPY W/ BIOPSIES AND POLYPECTOMY  8/03, 6/05, 7/09, 9/10   internal hemorrhoids, tubular adenomas, mucosa & lymphoid nodules  . INGUINAL HERNIA REPAIR Right   . TOTAL HIP ARTHROPLASTY Right 11/13/2012   Procedure: TOTAL HIP ARTHROPLASTY ANTERIOR APPROACH;  Surgeon: Mcarthur Rossetti, MD;  Location: [facility];  Service: Orthopedics;  Laterality: Right;  Right total hip arthroplasty  . UPPER GASTROINTESTINAL ENDOSCOPY  3/05, 7/09, 9/10,2013   gastritis, duodenitis    Home Medications: Prior to Admission medications   Medication Sig Start Date End Date Taking? Authorizing Provider  Cholecalciferol (VITAMIN D3) 50000 units CAPS Take 50,000 Units by mouth once a week.   Yes [provider]  finasteride (PROSCAR) 5 MG tablet Take 1 tablet (5 mg total) by mouth daily. 11/22/17  Yes [name], [name], PA-C  fluticasone (FLONASE) 50 MCG/ACT nasal spray Place 2 sprays into both nostrils daily. 09/12/17  Yes [name], MD  furosemide (LASIX) 40 MG tablet Take 40 mg by mouth daily.   Yes [provider]  levothyroxine (SYNTHROID, LEVOTHROID) 50 MCG tablet Take 1 tablet (50 mcg total) by mouth daily before breakfast. 12/02/16  Yes [name], MD  losartan (COZAAR) 50 MG tablet TAKE 1 TABLET BY MOUTH ONCE DAILY 04/26/17  Yes [name], MD  magnesium oxide (MAG-OX) 400 (241.3 Mg) MG tablet Take 1 tablet (400 mg total) by mouth daily. 05/24/17  Yes [name], MD  pantoprazole (PROTONIX) 40 MG tablet TAKE 1 TABLET BY MOUTH ONCE DAILY 09/11/17  Yes [name], MD  [facility]  108 (90 Base) MCG/ACT inhaler INHALE 1-2 PUFFS INTO THE LUNGS EVERY 6 HOURS AS NEEDED FOR WHEEZING OR SHORTNESS OF BREATH 09/11/17  Yes [name], MD  saccharomyces boulardii (FLORASTOR) 250 MG capsule Take 250 mg by mouth as needed.    Yes [provider]  tamsulosin (FLOMAX) 0.4 MG CAPS capsule Take 1 capsule (0.4 mg total) by mouth daily. 11/22/17  Yes [name], [name], PA-C  umeclidinium-vilanterol (ANORO ELLIPTA) 62.5-25 MCG/INH AEPB Inhale 1 puff into the lungs daily. 04/03/17  Yes [name], MD    Allergies: Allergies  Allergen Reactions  . Fexofenadine Other (See  Comments)  . Quinapril Hcl Other (See Comments)    Unknown   . Latex Rash    Social History:  reports that he quit smoking about 54 years ago. His smoking use included cigarettes. He has never used smokeless tobacco. He reports that he does not drink alcohol or use drugs.   Family History: Family History  Problem Relation Age of Onset  . Thyroid cancer Daughter 63  . Diabetes Son   . Urolithiasis Son   . Leukemia Maternal Aunt   . Colon cancer Neg Hx   . Esophageal cancer Neg Hx   . Rectal cancer Neg Hx   . Stomach cancer Neg Hx   . Prostate cancer Neg Hx   . Kidney disease Neg Hx   . Kidney cancer Neg Hx   . Bladder Cancer Neg Hx     Review of Systems: Review of Systems  Constitutional: Negative for chills and fever.  HENT: Negative for hearing loss.   Eyes: Negative for blurred vision.  Respiratory: Negative for shortness of breath.   Cardiovascular: Negative for chest pain.  Gastrointestinal: Positive for abdominal pain. Negative for diarrhea, nausea and vomiting.  Genitourinary: Negative for dysuria.  Musculoskeletal: Negative for myalgias.  Skin: Negative for rash.  Neurological: Negative for dizziness.  Psychiatric/Behavioral: Negative for depression.  All other systems reviewed and are negative.   Physical Exam BP (!) 127/97   Pulse 84   Temp 98 F (36.7 C) (Oral)   Resp (!) 32   Wt 59 kg (130 lb)   SpO2 99%   BMI 22.31 kg/m  CONSTITUTIONAL: No acute distress HEENT:  Normocephalic, atraumatic, extraocular motion intact. NECK: Trachea is midline, and there is no jugular venous distension. RESPIRATORY:  Lungs are clear, and breath sounds are equal bilaterally. Normal respiratory effort without pathologic use of accessory muscles. CARDIOVASCULAR: Heart is regular without murmurs, gallops, or rubs. GI: The abdomen is soft, distended, with mild tenderness to palpation, right > left sides about the mid abdomen.  No peritonitis.  NG tube in place with clear  gastric contents. MUSCULOSKELETAL:  Normal muscle strength and tone in all four extremities.  No peripheral edema or cyanosis. SKIN: Skin turgor is normal. There are no pathologic skin lesions.  NEUROLOGIC:  Motor and sensation is grossly normal.  Cranial nerves are grossly intact. PSYCH:  Alert and oriented to person, place and time. Affect is normal.  Laboratory Analysis: Results for orders placed or performed during the hospital encounter of 12/16/17 (from the past 24 hour(s))  Lipase, blood     Status: None   Collection Time: 12/16/17  6:20 PM  Result Value Ref Range   Lipase 41 11 - 51 U/L  Comprehensive metabolic panel     Status: Abnormal   Collection Time: 12/16/17  6:20 PM  Result Value Ref Range   Sodium 132 (L) 135 -  145 mmol/L   Potassium 5.2 (H) 3.5 - 5.1 mmol/L   Chloride 92 (L) 101 - 111 mmol/L   CO2 30 22 - 32 mmol/L   Glucose, Bld 151 (H) 65 - 99 mg/dL   BUN 19 6 - 20 mg/dL   Creatinine, Ser 1.30 (H) 0.61 - 1.24 mg/dL   Calcium 8.8 (L) 8.9 - 10.3 mg/dL   Total Protein 6.5 6.5 - 8.1 g/dL   Albumin 3.6 3.5 - 5.0 g/dL   AST 25 15 - 41 U/L   ALT 11 (L) 17 - 63 U/L   Alkaline Phosphatase 61 38 - 126 U/L   Total Bilirubin 0.4 0.3 - 1.2 mg/dL   GFR calc non Af Amer 48 (L) >60 mL/min   GFR calc Af Amer 56 (L) >60 mL/min   Anion gap 10 5 - 15  CBC     Status: Abnormal   Collection Time: 12/16/17  6:20 PM  Result Value Ref Range   WBC 6.6 3.8 - 10.6 K/uL   RBC 3.81 (L) 4.40 - 5.90 MIL/uL   Hemoglobin 12.2 (L) 13.0 - 18.0 g/dL   HCT 35.9 (L) 40.0 - 52.0 %   MCV 94.1 80.0 - 100.0 fL   MCH 32.0 26.0 - 34.0 pg   MCHC 34.0 32.0 - 36.0 g/dL   RDW 13.4 11.5 - 14.5 %   Platelets 238 150 - 440 K/uL    Imaging: Dg Abdomen Acute W/chest  Result Date: 12/16/2017 CLINICAL DATA:  Abdominal pain. EXAM: DG ABDOMEN ACUTE W/ 1V CHEST COMPARISON:  CT of the abdomen and pelvis August 14, 2017 and chest x-ray August 14, 2017 FINDINGS: Air under the right left diaphragm is  consistent with air in colon and stomach. This is been seen on previous studies. No pneumothorax. No pulmonary nodules or masses. No focal infiltrates. The cardiomediastinal silhouette is normal. No free air identified. Air-filled prominent loops of large and small bowel suggest ileus rather than obstruction. No other acute abnormalities identified. IMPRESSION: Apparent ileus in the abdomen.  No other acute abnormalities. Electronically Signed   By: Dorise Bullion III M.D   On: 12/16/2017 19:33    Assessment and Plan: This is a 84 y.o. male who presents with abdominal pain and diffuse distention of small and large bowel. I have independently viewed the patient's imaging studies and reviewed his laboratory studies.  CT scan was done tonight as well, showing diffuse small and large bowel distention, but no obstruction or transition point.  However, there is an area of mesenteric swirling, but no bowel thickening or fluid.  Less suspicion for volvulus.  Patient has normal WBC and reports more discomfort from NG tube than abdomen.  The patient has had multiple presentations and imaging studies in the past consistent with this same picture today.  The exception is the swirling of mesentery noted on tonight's CT scan.  However, there is no evidence of closed loop obstruction, bowel thickening, or fluid in that area.  The patient's family reports that his symptoms tonight are the same as his prior episodes as well.  In light of this, less suspicion of an acute abdomen or any urgent surgical need.  Recommend hospitalist admission for diffuse ileus.  Would recommend continuing NG tube to suction to help decompress his abdomen.  Keep NPO with IV fluid hydration.  We will continue with abdominal exams and also he should get repeat labs in the morning. Patient understands that though there is less suspicion for an acute  abdomen, if his WBC worsens or his pain worsens or does not improve, he may need to go to the OR for  further evaluation and management.  Patient understands this plan and all of his questions have been answered.  Face-to-face time spent with the patient and care providers was 80 minutes, with more than 50% of the time spent counseling, educating, and coordinating care of the patient.     Melvyn Neth, [place]

## 2017-12-16 NOTE — Assessment & Plan Note (Signed)
Followed by Dr Alva Garnet.  Uses BiPAP every night.  Breathing better.  Follow.

## 2017-12-16 NOTE — Assessment & Plan Note (Signed)
Recheck sodium.  Has been stable.  Previously worked up by nephrology.

## 2017-12-16 NOTE — ED Triage Notes (Signed)
Pt brought in by [facility] from home with wife.  Per EMS pt was shaking in pain approx 30-40 mins ago.  Pt was rating pain at 8/10.  Pt states that his stomach feels tight and wife reported to EMS that pt hasn't been having good bowel movements.  Per EMS, they gave 49mcg fentanyl and pt states pain is down to 6/10.  EMS also reports that wife gave pt something for gas prior to them arriving.  Pt is A&Ox4.

## 2017-12-17 ENCOUNTER — Inpatient Hospital Stay: Payer: Medicare Other

## 2017-12-17 ENCOUNTER — Other Ambulatory Visit: Payer: Self-pay

## 2017-12-17 DIAGNOSIS — K567 Ileus, unspecified: Secondary | ICD-10-CM | POA: Diagnosis not present

## 2017-12-17 DIAGNOSIS — K560 Paralytic ileus: Secondary | ICD-10-CM

## 2017-12-17 LAB — COMPREHENSIVE METABOLIC PANEL
ALBUMIN: 3.5 g/dL (ref 3.5–5.0)
ALT: 13 U/L — ABNORMAL LOW (ref 17–63)
ANION GAP: 9 (ref 5–15)
AST: 24 U/L (ref 15–41)
Alkaline Phosphatase: 58 U/L (ref 38–126)
BILIRUBIN TOTAL: 0.6 mg/dL (ref 0.3–1.2)
BUN: 18 mg/dL (ref 6–20)
CHLORIDE: 94 mmol/L — AB (ref 101–111)
CO2: 29 mmol/L (ref 22–32)
Calcium: 8.5 mg/dL — ABNORMAL LOW (ref 8.9–10.3)
Creatinine, Ser: 1.26 mg/dL — ABNORMAL HIGH (ref 0.61–1.24)
GFR calc Af Amer: 58 mL/min — ABNORMAL LOW (ref >60)
GFR calc non Af Amer: 50 mL/min — ABNORMAL LOW (ref >60)
GLUCOSE: 97 mg/dL (ref 65–99)
POTASSIUM: 4.3 mmol/L (ref 3.5–5.1)
SODIUM: 132 mmol/L — AB (ref 135–145)
TOTAL PROTEIN: 6.1 g/dL — AB (ref 6.5–8.1)

## 2017-12-17 LAB — CBC WITH DIFFERENTIAL/PLATELET
BASOS PCT: 0 %
Basophils Absolute: 0 10*3/uL (ref 0–0.1)
Eosinophils Absolute: 0 10*3/uL (ref 0–0.7)
Eosinophils Relative: 0 %
HEMATOCRIT: 35.5 % — AB (ref 40.0–52.0)
HEMOGLOBIN: 11.7 g/dL — AB (ref 13.0–18.0)
LYMPHS ABS: 1 10*3/uL (ref 1.0–3.6)
LYMPHS PCT: 11 %
MCH: 31.2 pg (ref 26.0–34.0)
MCHC: 33.1 g/dL (ref 32.0–36.0)
MCV: 94.3 fL (ref 80.0–100.0)
MONO ABS: 0.8 10*3/uL (ref 0.2–1.0)
MONOS PCT: 10 %
NEUTROS ABS: 6.8 10*3/uL — AB (ref 1.4–6.5)
NEUTROS PCT: 79 %
Platelets: 253 10*3/uL (ref 150–440)
RBC: 3.77 MIL/uL — ABNORMAL LOW (ref 4.40–5.90)
RDW: 13.2 % (ref 11.5–14.5)
WBC: 8.6 10*3/uL (ref 3.8–10.6)

## 2017-12-17 LAB — GLUCOSE, CAPILLARY
GLUCOSE-CAPILLARY: 98 mg/dL (ref 65–99)
Glucose-Capillary: 110 mg/dL — ABNORMAL HIGH (ref 65–99)
Glucose-Capillary: 90 mg/dL (ref 65–99)

## 2017-12-17 MED ORDER — FUROSEMIDE 40 MG PO TABS
40.0000 mg | ORAL_TABLET | Freq: Every day | ORAL | Status: DC
  Administered 2017-12-17: 40 mg via ORAL
  Filled 2017-12-17: qty 1

## 2017-12-17 MED ORDER — INSULIN ASPART 100 UNIT/ML ~~LOC~~ SOLN
0.0000 [IU] | Freq: Four times a day (QID) | SUBCUTANEOUS | Status: DC
Start: 2017-12-17 — End: 2017-12-17

## 2017-12-17 MED ORDER — FLUTICASONE PROPIONATE 50 MCG/ACT NA SUSP
1.0000 | Freq: Every day | NASAL | Status: DC | PRN
  Filled 2017-12-17: qty 16

## 2017-12-17 MED ORDER — UMECLIDINIUM-VILANTEROL 62.5-25 MCG/INH IN AEPB
1.0000 | INHALATION_SPRAY | Freq: Every day | RESPIRATORY_TRACT | Status: DC
  Administered 2017-12-17: 1 via RESPIRATORY_TRACT
  Filled 2017-12-17: qty 14

## 2017-12-17 MED ORDER — MAGNESIUM OXIDE 400 (241.3 MG) MG PO TABS
400.0000 mg | ORAL_TABLET | Freq: Every day | ORAL | Status: DC
  Administered 2017-12-17: 400 mg via ORAL
  Filled 2017-12-17: qty 1

## 2017-12-17 MED ORDER — ACETAMINOPHEN 325 MG PO TABS
650.0000 mg | ORAL_TABLET | Freq: Four times a day (QID) | ORAL | Status: DC | PRN
Start: 2017-12-17 — End: 2017-12-17

## 2017-12-17 MED ORDER — ONDANSETRON HCL 4 MG/2ML IJ SOLN: 4.0000 mg | Freq: Four times a day (QID) | INTRAMUSCULAR | Status: DC | PRN

## 2017-12-17 MED ORDER — LOSARTAN POTASSIUM 50 MG PO TABS
50.0000 mg | ORAL_TABLET | Freq: Every day | ORAL | Status: DC
  Administered 2017-12-17: 50 mg via ORAL
  Filled 2017-12-17: qty 1

## 2017-12-17 MED ORDER — FINASTERIDE 5 MG PO TABS
5.0000 mg | ORAL_TABLET | Freq: Every day | ORAL | Status: DC
  Administered 2017-12-17: 5 mg via ORAL
  Filled 2017-12-17: qty 1

## 2017-12-17 MED ORDER — ALBUTEROL SULFATE (2.5 MG/3ML) 0.083% IN NEBU: 3.0000 mL | INHALATION_SOLUTION | Freq: Four times a day (QID) | RESPIRATORY_TRACT | Status: DC | PRN

## 2017-12-17 MED ORDER — MORPHINE SULFATE (PF) 2 MG/ML IV SOLN
2.0000 mg | INTRAVENOUS | Status: DC | PRN
  Administered 2017-12-17: 2 mg via INTRAVENOUS
  Filled 2017-12-17: qty 1

## 2017-12-17 MED ORDER — LEVOTHYROXINE SODIUM 50 MCG PO TABS: 50.0000 ug | ORAL_TABLET | Freq: Every day | ORAL | Status: DC

## 2017-12-17 MED ORDER — ONDANSETRON HCL 4 MG PO TABS: 4.0000 mg | ORAL_TABLET | Freq: Four times a day (QID) | ORAL | Status: DC | PRN

## 2017-12-17 MED ORDER — PANTOPRAZOLE SODIUM 40 MG PO TBEC
40.0000 mg | DELAYED_RELEASE_TABLET | Freq: Every day | ORAL | Status: DC
  Administered 2017-12-17: 40 mg via ORAL
  Filled 2017-12-17: qty 1

## 2017-12-17 MED ORDER — ACETAMINOPHEN 650 MG RE SUPP: 650.0000 mg | Freq: Four times a day (QID) | RECTAL | Status: DC | PRN

## 2017-12-17 MED ORDER — MORPHINE SULFATE (PF) 2 MG/ML IV SOLN
1.0000 mg | INTRAVENOUS | Status: DC | PRN
Start: 2017-12-17 — End: 2017-12-17

## 2017-12-17 MED ORDER — TAMSULOSIN HCL 0.4 MG PO CAPS
0.4000 mg | ORAL_CAPSULE | Freq: Every day | ORAL | Status: DC
  Administered 2017-12-17: 0.4 mg via ORAL
  Filled 2017-12-17: qty 1

## 2017-12-17 MED ORDER — LORATADINE 10 MG PO TABS
5.0000 mg | ORAL_TABLET | Freq: Every day | ORAL | Status: DC | PRN
  Administered 2017-12-17: 5 mg via ORAL
  Filled 2017-12-17: qty 1

## 2017-12-17 MED ORDER — ENOXAPARIN SODIUM 40 MG/0.4ML ~~LOC~~ SOLN: 40.0000 mg | SUBCUTANEOUS | Status: DC

## 2017-12-17 NOTE — Progress Notes (Signed)
Aaron Hendrix to be D/C'd home   per MD order.  Discussed prescriptions and follow up appointments with the patient. Prescriptions given to patient, medication list explained in detail. Pt verbalized understanding.    Vitals:   12/17/17 0153 12/17/17 1219  BP: (!) 141/58 (!) 119/45  Pulse: 83 89  Resp: 16 16  Temp: 98.1 F (36.7 C) 98.4 F (36.9 C)  SpO2: 100% 98%    Skin clean, dry and intact without evidence of skin break down, no evidence of skin tears noted. IV catheter discontinued intact. Site without signs and symptoms of complications. Dressing and pressure applied. Pt denies pain at this time. No complaints noted.  An After Visit Summary was printed and given to the patient. Patient escorted via [place], and D/C home via private auto.  [name] A

## 2017-12-17 NOTE — Discharge Summary (Signed)
[facility] [place] NAME: Aaron Hendrix    MR#:  [id number]  DATE OF BIRTH:  1930-03-14  DATE OF ADMISSION:  12/16/2017 ADMITTING PHYSICIAN: Lance Coon, MD  DATE OF DISCHARGE: 12/17/2017  PRIMARY CARE PHYSICIAN: [name], MD    ADMISSION DIAGNOSIS:  Ileus ([place]) [K56.7] Generalized abdominal pain [R10.84]  DISCHARGE DIAGNOSIS:  Principal Problem:   Ileus ([place]) Active Problems:   Hypertension   Hypothyroidism   COPD (chronic obstructive pulmonary disease) ([place])   Diabetes mellitus ([place])   SECONDARY DIAGNOSIS:   Past Medical History:  Diagnosis Date  . Carpal tunnel syndrome   . COPD with asthma ([place])   . Diabetes mellitus    Diet control   . DJD (degenerative joint disease), cervical   . Duodenal ulcer, with partial obstruction 08/10/2012  . Essential and other specified forms of tremor 12/16/2013  . Hyperlipidemia   . Hypertension    no medicine needed  . IBS (irritable bowel syndrome)   . Internal hemorrhoids   . Memory deficit 12/16/2013  . Numbness and tingling in right hand    started 2 yeas ago  . Peptic ulcer   . Personal history of colonic polyps    adenomatous  . Polyneuropathy in diabetes(357.2)   . Tremor, essential   . Vitamin D deficiency     HOSPITAL COURSE:   84 y.o. male with history of diabetes, COPD and essential hypertension who presented with abdominal pain and distention and found to have  acute on chronic ileus vs pseudo-obstruction   1.  Acute on chronic ileus: Patient was evaluated by surgery.  No indication for surgical intervention. Initially patient had NG tube this has now been discontinued.  Patient's abdomen is soft, nontender and nondistended. Surgery had recommended advancing diet as tolerated. Surgery is recommending GI outpatient evaluation due to chronic ileus.  2.  Essential hypertension: Patient will resume outpatient medications including losartan  3.   Hyperlipidemia: Continue Synthroid 4.  BPH: Continue tamsulosin and finasteride 5.  Diet controlled diabetes  DISCHARGE CONDITIONS AND DIET:   Stable diabetic diet  CONSULTS OBTAINED:  Treatment Team:  Olean Ree, MD  DRUG ALLERGIES:   Allergies  Allergen Reactions  . Fexofenadine Other (See Comments)  . Quinapril Hcl Other (See Comments)    Unknown   . Latex Rash    DISCHARGE MEDICATIONS:   Allergies as of 12/17/2017      Reactions   Fexofenadine Other (See Comments)   Quinapril Hcl Other (See Comments)   Unknown    Latex Rash      Medication List    TAKE these medications   finasteride 5 MG tablet Commonly known as:  PROSCAR Take 1 tablet (5 mg total) by mouth daily.   fluticasone 50 MCG/ACT nasal spray Commonly known as:  FLONASE Place 2 sprays into both nostrils daily.   furosemide 40 MG tablet Commonly known as:  LASIX Take 40 mg by mouth daily.   levothyroxine 50 MCG tablet Commonly known as:  SYNTHROID, LEVOTHROID Take 1 tablet (50 mcg total) by mouth daily before breakfast.   losartan 50 MG tablet Commonly known as:  COZAAR TAKE 1 TABLET BY MOUTH ONCE DAILY   magnesium oxide 400 (241.3 Mg) MG tablet Commonly known as:  MAG-OX Take 1 tablet (400 mg total) by mouth daily.   pantoprazole 40 MG tablet Commonly known as:  PROTONIX TAKE 1 TABLET BY MOUTH ONCE DAILY   PROAIR HFA 108 (90 Base)  MCG/ACT inhaler Generic drug:  albuterol INHALE 1-2 PUFFS INTO THE LUNGS EVERY 6 HOURS AS NEEDED FOR WHEEZING OR SHORTNESS OF BREATH   saccharomyces boulardii 250 MG capsule Commonly known as:  FLORASTOR Take 250 mg by mouth as needed.   tamsulosin 0.4 MG Caps capsule Commonly known as:  FLOMAX Take 1 capsule (0.4 mg total) by mouth daily.   umeclidinium-vilanterol 62.5-25 MCG/INH Aepb Commonly known as:  ANORO ELLIPTA Inhale 1 puff into the lungs daily.   Vitamin D3 50000 units Caps Take 50,000 Units by mouth once a week.         Today    CHIEF COMPLAINT:   Doing well this am No abdominal pain   VITAL SIGNS:  Blood pressure (!) 141/58, pulse 83, temperature 98.1 F (36.7 C), temperature source Oral, resp. rate 16, height 5\' 3"  (1.6 m), weight 60 kg (132 lb 3.2 oz), SpO2 100 %.   REVIEW OF SYSTEMS:  Review of Systems  Constitutional: Negative.  Negative for chills, fever and malaise/fatigue.  HENT: Negative.  Negative for ear discharge, ear pain, hearing loss, nosebleeds and sore throat.   Eyes: Negative.  Negative for blurred vision and pain.  Respiratory: Negative.  Negative for cough, hemoptysis, shortness of breath and wheezing.   Cardiovascular: Negative.  Negative for chest pain, palpitations and leg swelling.  Gastrointestinal: Negative.  Negative for abdominal pain, blood in stool, diarrhea, nausea and vomiting.  Genitourinary: Negative.  Negative for dysuria.  Musculoskeletal: Negative.  Negative for back pain.  Skin: Negative.   Neurological: Negative for dizziness, tremors, speech change, focal weakness, seizures and headaches.  Endo/Heme/Allergies: Negative.  Does not bruise/bleed easily.  Psychiatric/Behavioral: Negative.  Negative for depression, hallucinations and suicidal ideas.     PHYSICAL EXAMINATION:  GENERAL:  84 y.o.-year-old patient lying in the bed with no acute distress.  NECK:  Supple, no jugular venous distention. No thyroid enlargement, no tenderness.  LUNGS: Normal breath sounds bilaterally, no wheezing, rales,rhonchi  No use of accessory muscles of respiration.  CARDIOVASCULAR: S1, S2 normal. No murmurs, rubs, or gallops.  ABDOMEN: Soft, non-tender, non-distended. Bowel sounds present. No organomegaly or mass.  EXTREMITIES: No pedal edema, cyanosis, or clubbing.  PSYCHIATRIC: The patient is alert and oriented x 3.  SKIN: No obvious rash, lesion, or ulcer.   DATA REVIEW:   CBC Recent Labs  Lab 12/17/17 0559  WBC 8.6  HGB 11.7*  HCT 35.5*  PLT 253    Chemistries   Recent Labs  Lab 12/17/17 0559  NA 132*  K 4.3  CL 94*  CO2 29  GLUCOSE 97  BUN 18  CREATININE 1.26*  CALCIUM 8.5*  AST 24  ALT 13*  ALKPHOS 58  BILITOT 0.6    Cardiac Enzymes No results for input(s): TROPONINI in the last 168 hours.  Microbiology Results  @[email or web address] @  RADIOLOGY:  Dg Abd 1 View  Result Date: 12/17/2017 CLINICAL DATA:  Follow-up probable ileus. EXAM: ABDOMEN - 1 VIEW COMPARISON:  Yesterday. FINDINGS: Nasogastric tube tip and side hole in the proximal stomach. Increased diameter of multiple gas-filled loops of colon and small bowel. Excreted contrast in the urinary bladder and bladder diverticula. Small amount of oral contrast within stool in the hepatic flexure. The lumbar and lower thoracic spine degenerative changes and mild rotary scoliosis. Right hip prosthesis. IMPRESSION: Mildly progressive colonic small bowel ileus. Electronically Signed   By: Claudie Revering M.D.   On: 12/17/2017 11:12   Ct Abdomen Pelvis W Contrast  Result Date: 12/16/2017  CLINICAL DATA:  Severe abdominal pain and difficulty with bowel movements. EXAM: CT ABDOMEN AND PELVIS WITH CONTRAST TECHNIQUE: Multidetector CT imaging of the abdomen and pelvis was performed using the standard protocol following bolus administration of intravenous contrast. CONTRAST:  179mL ISOVUE-300 IOPAMIDOL (ISOVUE-300) INJECTION 61% COMPARISON:  08/14/2017 FINDINGS: Lower chest: Normal heart size with coronary arteriosclerosis and aortic atherosclerosis. No pericardial effusion. No active pulmonary disease. Atelectasis noted at the right lung base. Hepatobiliary: Layering gallstones within the gallbladder without secondary signs of acute cholecystitis. No intrahepatic nor extrahepatic biliary dilatation. Homogeneous enhancement of the liver. Pancreas: No focal pancreatic mass. Visualization of the main pancreatic duct which is normal in caliber and less than 3 mm. Spleen: Normal size spleen without mass.  Adrenals/Urinary Tract: Normal bilateral adrenal glands. Stable upper pole right renal simple cyst measuring 17 mm on current study. Smaller interpolar right renal cyst too small to further characterize measuring 4 mm. Similar finding in the lower pole of left kidney. No nephrolithiasis nor obstructive uropathy. Mild circumferential thickening of the urinary bladder a may reflect mild cystitis. Right-sided bladder diverticulosis. No focal mural thickening of the bladder or calculus. Stomach/Bowel: The stomach is physiologically distended with ingested oral contrast. A small hyperplastic polyp is seen measuring 3 mm, series 5/45, series 2/39 in the distal second portion of the duodenum. Small bowel crosses midline and normal ligament of Treitz position is demonstrated. There is swirling of the central mesentery without definite evidence of small bowel volvulus given fluid and air distention of distal small intestine to the terminal ileum. Gas and stool noted within large bowel as well without focal mural thickening. No pneumatosis or portal venous gas. Colonic interposition is present over the liver. A large amount of retained stool is seen along the descending colon. Vascular/Lymphatic: Aortoiliac atherosclerosis.  No lymphadenopathy. Reproductive: Mild prostatic enlargement. Seminal vesicles are unremarkable. Other: Mesenteric edema with small to moderate volume of free fluid in the abdomen and pelvis is noted. Musculoskeletal: Degenerative disc disease with vacuum disc phenomenon and moderate to marked disc space flattening from L2 through L5. No acute nor suspicious osseous abnormality. IMPRESSION: 1. Nonspecific fluid and gas-filled distention of small and large bowel without definite source of mechanical obstruction, pneumatosis, nor portal venous gas. There is some swirling of the central mesentery which can be seen in small bowel volvulus however, given the presence of gas and fluid within distal small bowel  to the level of the terminal ileum, this is believed less likely. This finding can also be seen with a floppy mesentery and normal variance. A severe enteritis might account for the fluid in gaseous distention of small bowel appearance and explain some of the mesenteric edema and small volume free fluid in the abdomen and pelvis. Would recommend follow-up radiographs of the abdomen to assure that the ingested oral contrast passes into the colon and to exclude the possibility of a small bowel obstruction or volvulus (both believed less likely). 2. 3 mm filling defect within the second portion of the duodenum likely to represent a tiny hyperplastic polyp. 3. Uncomplicated cholelithiasis. 4. Stable lumbar spondylosis. Electronically Signed   By: Ashley Royalty M.D.   On: 12/16/2017 20:39   Dg Abdomen Acute W/chest  Result Date: 12/16/2017 CLINICAL DATA:  Abdominal pain. EXAM: DG ABDOMEN ACUTE W/ 1V CHEST COMPARISON:  CT of the abdomen and pelvis August 14, 2017 and chest x-ray August 14, 2017 FINDINGS: Air under the right left diaphragm is consistent with air in colon and stomach. This  is been seen on previous studies. No pneumothorax. No pulmonary nodules or masses. No focal infiltrates. The cardiomediastinal silhouette is normal. No free air identified. Air-filled prominent loops of large and small bowel suggest ileus rather than obstruction. No other acute abnormalities identified. IMPRESSION: Apparent ileus in the abdomen.  No other acute abnormalities. Electronically Signed   By: Dorise Bullion III M.D   On: 12/16/2017 19:33   Dg Abd Portable 1 View  Result Date: 12/16/2017 CLINICAL DATA:  NG tube placement. EXAM: PORTABLE ABDOMEN - 1 VIEW COMPARISON:  12/16/2017 at 19:08 p.m. FINDINGS: There is been interval placement of enteric tube which has tip over the stomach in the left upper quadrant and side-port in the region of the gastroesophageal junction. Lung bases unremarkable. No significant change in  multiple air-filled large and small bowel loops. Remainder of the exam is unchanged. IMPRESSION: No change in multiple air-filled large and small bowel loops likely ileus. Enteric tube with tip over the stomach in the left upper quadrant. Electronically Signed   By: Marin Olp M.D.   On: 12/16/2017 21:43      Allergies as of 12/17/2017      Reactions   Fexofenadine Other (See Comments)   Quinapril Hcl Other (See Comments)   Unknown    Latex Rash      Medication List    TAKE these medications   finasteride 5 MG tablet Commonly known as:  PROSCAR Take 1 tablet (5 mg total) by mouth daily.   fluticasone 50 MCG/ACT nasal spray Commonly known as:  FLONASE Place 2 sprays into both nostrils daily.   furosemide 40 MG tablet Commonly known as:  LASIX Take 40 mg by mouth daily.   levothyroxine 50 MCG tablet Commonly known as:  SYNTHROID, LEVOTHROID Take 1 tablet (50 mcg total) by mouth daily before breakfast.   losartan 50 MG tablet Commonly known as:  COZAAR TAKE 1 TABLET BY MOUTH ONCE DAILY   magnesium oxide 400 (241.3 Mg) MG tablet Commonly known as:  MAG-OX Take 1 tablet (400 mg total) by mouth daily.   pantoprazole 40 MG tablet Commonly known as:  PROTONIX TAKE 1 TABLET BY MOUTH ONCE DAILY   PROAIR HFA 108 (90 Base) MCG/ACT inhaler Generic drug:  albuterol INHALE 1-2 PUFFS INTO THE LUNGS EVERY 6 HOURS AS NEEDED FOR WHEEZING OR SHORTNESS OF BREATH   saccharomyces boulardii 250 MG capsule Commonly known as:  FLORASTOR Take 250 mg by mouth as needed.   tamsulosin 0.4 MG Caps capsule Commonly known as:  FLOMAX Take 1 capsule (0.4 mg total) by mouth daily.   umeclidinium-vilanterol 62.5-25 MCG/INH Aepb Commonly known as:  ANORO ELLIPTA Inhale 1 puff into the lungs daily.   Vitamin D3 50000 units Caps Take 50,000 Units by mouth once a week.          Management plans discussed with the patient and he is in agreement. Stable for discharge   Patient should  follow up with pcp  CODE STATUS:     Code Status Orders  (From admission, onward)        Start     Ordered   12/17/17 0145  Full code  Continuous     12/17/17 0144    Code Status History    Date Active Date Inactive Code Status Order ID Comments User Context   03/19/2017 1602 03/23/2017 1744 Full Code [id number]  Fritzi Mandes, MD Inpatient   03/08/2017 1559 03/11/2017 1504 Full Code [id number]  Fritzi Mandes, MD Inpatient  11/25/2016 2044 11/27/2016 1455 Full Code [id number]  Demetrios Loll, MD Inpatient   07/16/2015 1508 07/22/2015 1744 Full Code [id number]  Hillary Bow, MD ED   06/25/2015 2014 07/06/2015 1757 Full Code [id number]  Gladstone Lighter, MD Inpatient   11/13/2012 1741 11/16/2012 1337 Full Code [id number]  Mcarthur Rossetti, MD Inpatient    Advance Directive Documentation     Most Recent Value  Type of Advance Directive  Living will  Pre-existing out of facility DNR order (yellow form or pink MOST form)  -  "MOST" Form in Place?  -      TOTAL TIME TAKING CARE OF THIS PATIENT: 38 minutes.    Note: This dictation was prepared with Dragon dictation along with smaller phrase technology. Any transcriptional errors that result from this process are unintentional.  William Laske M.D on 12/17/2017 at 11:43 AM  Between 7am to 6pm - Pager - [phone] After 6pm go to [email or web address] - password [facility]  Office  [phone]  CC: Primary care physician; [name], MD

## 2017-12-17 NOTE — Progress Notes (Signed)
Family Meeting Note  Advance Directive:yes  Today a meeting took place with the patient and family.    The following clinical team members were present during this meeting:MD  The following were discussed:Patient's diagnosis:acute on chronic ileus  , Patient's progosis: > 12 months and Goals for treatment: Full Code  Additional follow-up to be provided: patient would like to continue with FULL CODE status No need to update advanced directives  Time spent during discussion:16 minutes  Aaron Koury, MD

## 2017-12-17 NOTE — ED Notes (Signed)
Informed 2C patient was on his way up

## 2017-12-17 NOTE — Progress Notes (Signed)
SURGICAL PROGRESS NOTE (cpt [phone])  Hospital Day(s): 1.   Post op day(s):  Marland Kitchen   Interval History: Patient seen and examined, no acute events or new complaints since admission overnight except patient's very supportive and seemingly well-informed family reports that patient's NG tube "fell out" 3 - 3 1/2 hours ago, since which time patient has continued passing "a lot" of gas, says his abdominal girth has returned to his baseline, and he denies any abdominal pain, N/V, fever/chills, CP, or SOB. He has not yet ambulated, and patient's family says he ambulates very little at home.  Review of Systems:  Constitutional: denies fever, chills  HEENT: denies cough or congestion  Respiratory: denies any shortness of breath  Cardiovascular: denies chest pain or palpitations  Gastrointestinal: abdominal pain, N/V, and bowel function as per interval history Genitourinary: denies burning with urination or urinary frequency Musculoskeletal: denies pain, decreased motor or sensation Integumentary: denies any other rashes or skin discolorations Neurological: denies HA or vision/hearing changes   Vital signs in last 24 hours: [min-max] current  Temp:  [98 F (36.7 C)-98.1 F (36.7 C)] 98.1 F (36.7 C) (03/24 0153) Pulse Rate:  [50-84] 83 (03/24 0153) Resp:  [16-32] 16 (03/24 0153) BP: (113-141)/(45-97) 141/58 (03/24 0153) SpO2:  [92 %-100 %] 100 % (03/24 0153) Weight:  [130 lb (59 kg)-132 lb 3.2 oz (60 kg)] 132 lb 3.2 oz (60 kg) (03/24 0153)     Height: 5\' 3"  (160 cm) Weight: 132 lb 3.2 oz (60 kg) BMI (Calculated): 23.42   Intake/Output this shift:  No intake/output data recorded.   Intake/Output last 2 shifts:  @IOLAST2SHIFTS @   Physical Exam:  Constitutional: alert, cooperative and no distress  HENT: normocephalic without obvious abnormality  Eyes: PERRL, EOM's grossly intact and symmetric  Neuro: CN II - XII grossly intact and symmetric without deficit  Respiratory: breathing non-labored  at rest  Cardiovascular: regular rate and sinus rhythm  Gastrointestinal: soft and completely non-tender with even deep palpation with mild-/moderate- abdominal distention Musculoskeletal: UE and LE FROM, no edema or wounds, motor and sensation grossly intact, NT   Labs:  CBC Latest Ref Rng & Units 12/17/2017 12/16/2017 12/12/2017  WBC 3.8 - 10.6 K/uL 8.6 6.6 6.4  Hemoglobin 13.0 - 18.0 g/dL 11.7(L) 12.2(L) 12.4(L)  Hematocrit 40.0 - 52.0 % 35.5(L) 35.9(L) 37.5(L)  Platelets 150 - 440 K/uL 253 238 272.0   CMP Latest Ref Rng & Units 12/17/2017 12/16/2017 12/12/2017  Glucose 65 - 99 mg/dL 97 151(H) 94  BUN 6 - 20 mg/dL 18 19 16   Creatinine 0.61 - 1.24 mg/dL 1.26(H) 1.30(H) 1.26  Sodium 135 - 145 mmol/L 132(L) 132(L) 131(L)  Potassium 3.5 - 5.1 mmol/L 4.3 5.2(H) 4.5  Chloride 101 - 111 mmol/L 94(L) 92(L) 91(L)  CO2 22 - 32 mmol/L 29 30 34(H)  Calcium 8.9 - 10.3 mg/dL 8.5(L) 8.8(L) 9.6  Total Protein 6.5 - 8.1 g/dL 6.1(L) 6.5 6.6  Total Bilirubin 0.3 - 1.2 mg/dL 0.6 0.4 0.4  Alkaline Phos 38 - 126 U/L 58 61 63  AST 15 - 41 U/L 24 25 15   ALT 17 - 63 U/L 13(L) 11(L) 10   Imaging studies:  Abdominal X-ray (12/17/2017) - personally reviewed and compared to admission CT with family at bedside Somewhat improved, but clearly persistent, gaseous distention of large and small intestine   Assessment/Plan: (ICD-10's: K56.0 vs K56.690) 84 y.o. male with resolving acute on chronic ileus vs pseudo-obstruction (currently appears returned to baseline with significant albeit relatively asymptomatic gaseous  distention on abdominal xray), complicated by pertinent comorbidities including advanced chronological age, DM, HTN, HLD, COPD, irritable bowel syndrome, GERD with history of PUD, chronic malnutrition, and essential tremor.   - no indication for surgical intervention at this time  - continue to monitor abdominal exam and bowel function  - will order clear liquids diet and may advance as tolerated  - if  remains asymptomatic, do not need to reinsert NG tube  - recommend GI evaluation, but can be inpatient or outpatient  - long term, maintain adequate hydration to reduce any constipation  - medical management, correct electrolytes, and disposition as per primary medical team   - DVT prophylaxis, ambulation with assist strongly encouraged  All of the above findings and recommendations were discussed with the patient, his family, and patient's RN, and all of patient's and family's questions were answered to their expressed satisfaction.  Thank you for the opportunity to participate in this patient's care.  -- Marilynne Drivers Rosana Hoes, MD, [place]: [facility] - Partnering for exceptional care. Office: [phone]

## 2017-12-17 NOTE — ED Notes (Signed)
Alyssa, EDT to take patient to 2C-220

## 2017-12-17 NOTE — ED Notes (Signed)
Hospitalist at bedside 

## 2017-12-17 NOTE — Care Management Obs Status (Signed)
[facility] NOTIFICATION   Patient Details  Name: Aaron Hendrix MRN: [id number] Date of Birth: 1929/10/30   Medicare Observation Status Notification Given:  Yes    Shelbie Ammons, RN 12/17/2017, 3:06 PM

## 2017-12-19 DIAGNOSIS — K560 Paralytic ileus: Secondary | ICD-10-CM

## 2017-12-21 ENCOUNTER — Telehealth: Payer: Self-pay

## 2017-12-21 NOTE — Telephone Encounter (Signed)
Flagged on EMMI report for not having a follow up scheduled. Called and spoke with patient's wife, Agnus.  She mentioned he does have follow up appointments scheduled and that they do not have any questions or concerns regarding his discharge at this time.  I thanked her for her time and told her that they would receive one more call in the next few days checking on him.

## 2017-12-30 ENCOUNTER — Other Ambulatory Visit: Payer: Self-pay | Admitting: Internal Medicine

## 2018-01-01 ENCOUNTER — Telehealth: Payer: Self-pay

## 2018-01-01 ENCOUNTER — Telehealth: Payer: Self-pay | Admitting: Pulmonary Disease

## 2018-01-01 ENCOUNTER — Other Ambulatory Visit: Payer: Medicare Other

## 2018-01-01 ENCOUNTER — Ambulatory Visit: Payer: Medicare Other | Admitting: Podiatry

## 2018-01-01 ENCOUNTER — Ambulatory Visit: Payer: Self-pay | Admitting: *Deleted

## 2018-01-01 ENCOUNTER — Other Ambulatory Visit: Payer: Self-pay | Admitting: *Deleted

## 2018-01-01 NOTE — Telephone Encounter (Signed)
Patient is having a altered mental status, started on Saturday. Just not acting himself in the mornings His sodium level has down, could this be linked. Does have appt today for lab recheck, wife concerned   Wife is answering questions. Patient has lab work today- wife would like to have him seem by Dr Nicki Reaper this week if possible. Patient seems to have episodes of anxiety- mostly in the morning- he seems to recover quickly.There are no appointments with Dr Nicki Reaper this week that are open- will send note and request to the office for review. [place] contact# [phone] Reason for Disposition . [1] Symptoms of anxiety or panic AND [2] has not been evaluated for this by physician  Answer Assessment - Initial Assessment Questions 1. CONCERN: "What happened that made you call today?"     anxious this morning and saturday 2. ANXIETY SYMPTOM SCREENING: "Can you describe how you have been feeling?"  (e.g., tense, restless, panicky, anxious, keyed up, trouble sleeping, trouble concentrating)     Mainly in the morning- 02 level has been good-[id number] 3. ONSET: "How long have you been feeling this way?"     This past week 4. RECURRENT: "Have you felt this way before?"  If yes: "What happened that time?" "What helped these feelings go away in the past?"      Confusion was like that when his C02 was high 5. RISK OF HARM - SUICIDAL IDEATION:  "Do you ever have thoughts of hurting or killing yourself?"  (e.g., yes, no, no but preoccupation with thoughts about death)   - INTENT:  "Do you have thoughts of hurting or killing yourself right NOW?" (e.g., yes, no, N/A)   - PLAN: "Do you have a specific plan for how you would do this?" (e.g., gun, knife, overdose, no plan, N/A)     n/a 6. RISK OF HARM - HOMICIDAL IDEATION:  "Do you ever have thoughts of hurting or killing someone else?"  (e.g., yes, no, no but preoccupation with thoughts about death)   - INTENT:  "Do you have thoughts of hurting or killing someone right  NOW?" (e.g., yes, no, N/A)   - PLAN: "Do you have a specific plan for how you would do this?" (e.g., gun, knife, no plan, N/A)      n/a 7. FUNCTIONAL IMPAIRMENT: "How have things been going for you overall in your life? Have you had any more difficulties than usual doing your normal daily activities?"  (e.g., better, same, worse; self-care, school, work, interactions)     He has been more active- fluids help 8. SUPPORT: "Who is with you now?" "Who do you live with?" "Do you have family or friends nearby who you can talk to?"      Wife and daughter 27. THERAPIST: "Do you have a counselor or therapist? Name?"     n/a 10. STRESSORS: "Has there been any new stress or recent changes in your life?"       His cousin died recently 28. CAFFEINE ABUSE: "Do you drink caffeinated beverages, and how much each day?" (e.g., coffee, tea, colas)       n/a 12. SUBSTANCE ABUSE: "Do you use any illegal drugs or alcohol?"       n/a 13. OTHER SYMPTOMS: "Do you have any other physical symptoms right now?" (e.g., chest pain, palpitations, difficulty breathing, fever)       No other symptoms- BP good- some allergy symptoms 14. PREGNANCY: "Is there any chance you are pregnant?" "When was your last menstrual  period?"       n/a  Protocols used: ANXIETY AND PANIC ATTACK-A-AH

## 2018-01-01 NOTE — Telephone Encounter (Signed)
Copied from [place]. Topic: Quick Communication - Appointment Cancellation >> Jan 01, 2018  2:01 PM Yvette Rack wrote: Patient called to cancel appointment scheduled for 01-01-18. Patient has not rescheduled their appointment.  Route to department's PEC pool.  Pt is going to ER

## 2018-01-01 NOTE — Telephone Encounter (Signed)
Please advise 

## 2018-01-01 NOTE — Telephone Encounter (Signed)
Spoke with patients wife to check on husband. She stated that she had him on his bi-pap because he has not been wearing it like he is supposed to and has lab appt at our office at 2:15. Patients wife stated that she she would bring him to have labs done and take him over to urgent care to ensure nothing acute is going on.

## 2018-01-01 NOTE — Telephone Encounter (Signed)
Spoke with patient's daughter who scheduled f/u on 4/16, She will take mask for bipap to [facility] to address issues. She will observe as patient is alert and 02 levels normal right. If she needs to take to ER she will. Nothing further needed.

## 2018-01-01 NOTE — Telephone Encounter (Signed)
Pt daughter calling stating they need an urgent response She states this past weekend patient has been very confused They were not sure what was going on but this morning they noticed from his oxygen machine was only on about 3 hours last night Looks like there was a leak and machine shut off.  They are concerned patient Co2 levels are too high Patient is okay now and back on the machine but they are worried and would like to know if patient needs to go ER or if there is a way to check the levels here in office  Please advise

## 2018-01-02 ENCOUNTER — Telehealth: Payer: Self-pay | Admitting: *Deleted

## 2018-01-02 ENCOUNTER — Telehealth: Payer: Self-pay | Admitting: Radiology

## 2018-01-02 DIAGNOSIS — E871 Hypo-osmolality and hyponatremia: Secondary | ICD-10-CM

## 2018-01-02 NOTE — Telephone Encounter (Signed)
Pt coming in on 01/03/18 to recheck Sodium. See below for remainder of message

## 2018-01-02 NOTE — Telephone Encounter (Signed)
Pt rescheduled lab appt for 01/03/18. Future lab order placed.

## 2018-01-02 NOTE — Telephone Encounter (Signed)
Do not see where he came in for labs.  Please call and confirm ok and confirm was evaluated.  Also, if concern about increased CO2, he has BiPAP and needs to contact Dr Alva Garnet office as well.

## 2018-01-02 NOTE — Telephone Encounter (Signed)
Pt coming in tomorrow for labs, please place future orders. Thank you.  

## 2018-01-02 NOTE — Telephone Encounter (Signed)
I received a note from [facility] that pt rescheduled lab. I placed a future order for Sodium.

## 2018-01-02 NOTE — Telephone Encounter (Signed)
Thanks

## 2018-01-02 NOTE — Telephone Encounter (Signed)
Pt has spoke with Dr. Shawna Orleans office. Has appt on 01/09/18

## 2018-01-03 ENCOUNTER — Other Ambulatory Visit: Payer: Medicare Other

## 2018-01-03 ENCOUNTER — Other Ambulatory Visit (INDEPENDENT_AMBULATORY_CARE_PROVIDER_SITE_OTHER): Payer: Medicare Other

## 2018-01-03 DIAGNOSIS — E871 Hypo-osmolality and hyponatremia: Secondary | ICD-10-CM | POA: Diagnosis not present

## 2018-01-03 LAB — SODIUM: SODIUM: 130 meq/L — AB (ref 135–145)

## 2018-01-03 NOTE — Telephone Encounter (Signed)
Opened in error

## 2018-01-04 ENCOUNTER — Emergency Department: Payer: Medicare Other

## 2018-01-04 ENCOUNTER — Other Ambulatory Visit: Payer: Self-pay

## 2018-01-04 ENCOUNTER — Emergency Department
Admission: EM | Admit: 2018-01-04 | Discharge: 2018-01-04 | Disposition: A | Discharge status: Home / Self Care | Payer: Medicare Other | Attending: Emergency Medicine | Admitting: Emergency Medicine

## 2018-01-04 DIAGNOSIS — R410 Disorientation, unspecified: Secondary | ICD-10-CM | POA: Insufficient documentation

## 2018-01-04 DIAGNOSIS — J449 Chronic obstructive pulmonary disease, unspecified: Secondary | ICD-10-CM | POA: Diagnosis not present

## 2018-01-04 DIAGNOSIS — Z79899 Other long term (current) drug therapy: Secondary | ICD-10-CM | POA: Insufficient documentation

## 2018-01-04 DIAGNOSIS — F05 Delirium due to known physiological condition: Secondary | ICD-10-CM

## 2018-01-04 DIAGNOSIS — E1142 Type 2 diabetes mellitus with diabetic polyneuropathy: Secondary | ICD-10-CM | POA: Diagnosis not present

## 2018-01-04 DIAGNOSIS — Z96641 Presence of right artificial hip joint: Secondary | ICD-10-CM | POA: Insufficient documentation

## 2018-01-04 DIAGNOSIS — R4182 Altered mental status, unspecified: Secondary | ICD-10-CM | POA: Diagnosis present

## 2018-01-04 DIAGNOSIS — I10 Essential (primary) hypertension: Secondary | ICD-10-CM | POA: Diagnosis not present

## 2018-01-04 DIAGNOSIS — Z87891 Personal history of nicotine dependence: Secondary | ICD-10-CM | POA: Insufficient documentation

## 2018-01-04 LAB — URINALYSIS, COMPLETE (UACMP) WITH MICROSCOPIC
Bilirubin Urine: NEGATIVE
GLUCOSE, UA: NEGATIVE mg/dL
HGB URINE DIPSTICK: NEGATIVE
KETONES UR: NEGATIVE mg/dL
Leukocytes, UA: NEGATIVE
Nitrite: NEGATIVE
PROTEIN: NEGATIVE mg/dL
Specific Gravity, Urine: 1.008 (ref 1.005–1.030)
pH: 7 (ref 5.0–8.0)

## 2018-01-04 LAB — ETHANOL: Alcohol, Ethyl (B): <10 mg/dL (ref <10)

## 2018-01-04 LAB — AMMONIA: AMMONIA: 19 umol/L (ref 9–35)

## 2018-01-04 LAB — COMPREHENSIVE METABOLIC PANEL
ALT: 11 U/L — ABNORMAL LOW (ref 17–63)
ANION GAP: 5 (ref 5–15)
AST: 21 U/L (ref 15–41)
Albumin: 3.5 g/dL (ref 3.5–5.0)
Alkaline Phosphatase: 53 U/L (ref 38–126)
BUN: 18 mg/dL (ref 6–20)
CALCIUM: 8.9 mg/dL (ref 8.9–10.3)
CO2: 30 mmol/L (ref 22–32)
Chloride: 96 mmol/L — ABNORMAL LOW (ref 101–111)
Creatinine, Ser: 0.96 mg/dL (ref 0.61–1.24)
Glucose, Bld: 71 mg/dL (ref 65–99)
Potassium: 4.4 mmol/L (ref 3.5–5.1)
SODIUM: 131 mmol/L — AB (ref 135–145)
TOTAL PROTEIN: 6.2 g/dL — AB (ref 6.5–8.1)
Total Bilirubin: 0.5 mg/dL (ref 0.3–1.2)

## 2018-01-04 LAB — URINE DRUG SCREEN, QUALITATIVE (ARMC ONLY)
Amphetamines, Ur Screen: NOT DETECTED
Barbiturates, Ur Screen: NOT DETECTED
Benzodiazepine, Ur Scrn: NOT DETECTED
CANNABINOID 50 NG, UR ~~LOC~~: NOT DETECTED
Cocaine Metabolite,Ur ~~LOC~~: NOT DETECTED
MDMA (ECSTASY) UR SCREEN: NOT DETECTED
Methadone Scn, Ur: NOT DETECTED
OPIATE, UR SCREEN: NOT DETECTED
PHENCYCLIDINE (PCP) UR S: NOT DETECTED
Tricyclic, Ur Screen: NOT DETECTED

## 2018-01-04 LAB — CBC
HEMATOCRIT: 35.9 % — AB (ref 40.0–52.0)
Hemoglobin: 12.4 g/dL — ABNORMAL LOW (ref 13.0–18.0)
MCH: 32.5 pg (ref 26.0–34.0)
MCHC: 34.6 g/dL (ref 32.0–36.0)
MCV: 93.9 fL (ref 80.0–100.0)
PLATELETS: 265 10*3/uL (ref 150–440)
RBC: 3.82 MIL/uL — ABNORMAL LOW (ref 4.40–5.90)
RDW: 13.4 % (ref 11.5–14.5)
WBC: 6.5 10*3/uL (ref 3.8–10.6)

## 2018-01-04 LAB — SODIUM, URINE, RANDOM: SODIUM UR: 67 mmol/L

## 2018-01-04 LAB — DIFFERENTIAL
BASOS PCT: 1 %
Basophils Absolute: 0 10*3/uL (ref 0–0.1)
EOS ABS: 0.3 10*3/uL (ref 0–0.7)
EOS PCT: 5 %
Lymphocytes Relative: 22 %
Lymphs Abs: 1.4 10*3/uL (ref 1.0–3.6)
MONO ABS: 0.8 10*3/uL (ref 0.2–1.0)
Monocytes Relative: 12 %
Neutro Abs: 3.9 10*3/uL (ref 1.4–6.5)
Neutrophils Relative %: 60 %

## 2018-01-04 LAB — TROPONIN I

## 2018-01-04 LAB — CREATININE, URINE, RANDOM: Creatinine, Urine: 52 mg/dL

## 2018-01-04 NOTE — ED Provider Notes (Signed)
[facility] Emergency Department Provider Note  ____________________________________________   First MD Initiated Contact with Patient 01/04/18 [phone]     (approximate)  I have reviewed the triage vital signs and the nursing notes.   HISTORY  Chief Complaint Altered Mental Status  Level 5 exemption history limited by the patient's clinical condition  HPI Aaron Hendrix is a 84 y.o. male is brought to the emergency department via EMS for altered mental status.  According to EMS he has had frequent bouts of altered mental status and the family reports occasionally his carbon dioxide is high and occasionally his sodium is low.  The patient himself has no complaints.  Past Medical History:  Diagnosis Date  . Carpal tunnel syndrome   . COPD with asthma ([place])   . Diabetes mellitus    Diet control   . DJD (degenerative joint disease), cervical   . Duodenal ulcer, with partial obstruction 08/10/2012  . Essential and other specified forms of tremor 12/16/2013  . Hyperlipidemia   . Hypertension    no medicine needed  . IBS (irritable bowel syndrome)   . Internal hemorrhoids   . Memory deficit 12/16/2013  . Numbness and tingling in right hand    started 2 yeas ago  . Peptic ulcer   . Personal history of colonic polyps    adenomatous  . Polyneuropathy in diabetes(357.2)   . Tremor, essential   . Vitamin D deficiency     Patient Active Problem List   Diagnosis Date Noted  . Paralytic ileus ([place])   . Anemia 12/12/2017  . Healthcare maintenance 09/14/2017  . Respiratory failure with hypoxia and hypercapnia ([place]) 03/19/2017  . Palliative care by specialist 03/09/2017  . Hypercapnia   . Acute on chronic respiratory failure with hypercapnia ([place]) 03/08/2017  . Hypoxia 11/25/2016  . COPD exacerbation ([place]) 11/25/2016  . Rash 01/18/2016  . Nocturia 08/11/2015  . BPH with obstruction/lower urinary tract symptoms 08/11/2015  . Hematemesis 07/29/2015  .  Ileus ([place]) 07/29/2015  . Lower extremity edema 07/16/2015  . Increased urinary frequency 07/16/2015  . COPD (chronic obstructive pulmonary disease) ([place]) 07/16/2015  . Community acquired pneumonia   . Hyponatremia 06/25/2015  . Skin lesion 02/16/2015  . DNR (do not resuscitate) discussion 02/16/2015  . Elbow abrasion 09/27/2014  . Hypothyroidism 09/27/2014  . Chronic constipation 09/01/2014  . Trigger thumb 06/22/2014  . Abdominal pain 06/22/2014  . Personal history of other specified conditions 02/20/2014  . Decreased anal sphincter tone 02/19/2014  . Other specified symptoms and signs involving the digestive system and abdomen 02/19/2014  . Diabetes mellitus ([place]) 02/19/2014  . Memory deficit 12/16/2013  . Essential and other specified forms of tremor 12/16/2013  . Amnesia 12/16/2013  . Diarrhea 10/24/2013  . Loss of weight 09/10/2013  . Periumbilical abdominal pain 09/10/2013  . Pain in lower limb 07/01/2013  . Dermatophytosis of foot 07/01/2013  . Degenerative arthritis of hip 11/13/2012  . Osteoarthritis of hip 11/13/2012  . DU (duodenal ulcer) 08/22/2012  . Nausea 08/10/2012  . Hypertension 05/25/2011  . Diabetes mellitus, type II ([place]) 05/25/2011  . Irritable bowel syndrome 03/31/2008  . History of colonic polyps 03/31/2008  . Adaptive colitis 03/31/2008    Past Surgical History:  Procedure Laterality Date  . ANAL FISSURE REPAIR    . ANKLE SURGERY Right    right- pins placed in  . COLONOSCOPY W/ BIOPSIES AND POLYPECTOMY  8/03, 6/05, 7/09, 9/10   internal hemorrhoids, tubular adenomas, mucosa &  lymphoid nodules  . INGUINAL HERNIA REPAIR Right   . TOTAL HIP ARTHROPLASTY Right 11/13/2012   Procedure: TOTAL HIP ARTHROPLASTY ANTERIOR APPROACH;  Surgeon: Mcarthur Rossetti, MD;  Location: [facility];  Service: Orthopedics;  Laterality: Right;  Right total hip arthroplasty  . UPPER GASTROINTESTINAL ENDOSCOPY  3/05, 7/09, 9/10,2013   gastritis, duodenitis    Prior to  Admission medications   Medication Sig Start Date End Date Taking? Authorizing Provider  Cholecalciferol (VITAMIN D3) 50000 units CAPS Take 50,000 Units by mouth once a week.    [provider]  finasteride (PROSCAR) 5 MG tablet Take 1 tablet (5 mg total) by mouth daily. 11/22/17   Zara Council A, PA-C  fluticasone (FLONASE) 50 MCG/ACT nasal spray Place 2 sprays into both nostrils daily. 09/12/17   [name], MD  furosemide (LASIX) 40 MG tablet Take 40 mg by mouth daily.    [provider]  levothyroxine (SYNTHROID, LEVOTHROID) 50 MCG tablet Take 1 tablet (50 mcg total) by mouth daily before breakfast. 12/02/16   [name], MD  losartan (COZAAR) 50 MG tablet TAKE 1 TABLET BY MOUTH ONCE DAILY 04/26/17   [name], MD  magnesium oxide (MAG-OX) 400 (241.3 Mg) MG tablet TAKE 1 TABLET BY MOUTH ONCE DAILY 01/02/18   [name], MD  pantoprazole (PROTONIX) 40 MG tablet TAKE 1 TABLET BY MOUTH ONCE DAILY 09/11/17   [name], MD  PROAIR HFA 108 [phone] Base) MCG/ACT inhaler INHALE 1-2 PUFFS INTO THE LUNGS EVERY 6 HOURS AS NEEDED FOR WHEEZING OR SHORTNESS OF BREATH 09/11/17   [name], MD  saccharomyces boulardii (FLORASTOR) 250 MG capsule Take 250 mg by mouth as needed.     [provider]  tamsulosin (FLOMAX) 0.4 MG CAPS capsule Take 1 capsule (0.4 mg total) by mouth daily. 11/22/17   [name], [name], PA-C  umeclidinium-vilanterol (ANORO ELLIPTA) 62.5-25 MCG/INH AEPB Inhale 1 puff into the lungs daily. 04/03/17   [name], MD    Allergies Fexofenadine; Quinapril hcl; and Latex  Family History  Problem Relation Age of Onset  . Thyroid cancer Daughter 79  . Diabetes Son   . Urolithiasis Son   . Leukemia Maternal Aunt   . Colon cancer Neg Hx   . Esophageal cancer Neg Hx   . Rectal cancer Neg Hx   . Stomach cancer Neg Hx   . Prostate cancer Neg Hx   . Kidney disease Neg Hx   . Kidney cancer Neg Hx   . Bladder Cancer Neg Hx      Social History Social History   Tobacco Use  . Smoking status: Former Smoker    Types: Cigarettes    Last attempt to quit: 07/02/1963    Years since quitting: 54.5  . Smokeless tobacco: Never Used  Substance Use Topics  . Alcohol use: No    Alcohol/week: 0.0 oz  . Drug use: No      Review of Systems Level 5 exemption history limited by the patient's clinical condition ____________________________________________   PHYSICAL EXAM:  VITAL SIGNS: ED Triage Vitals  Enc Vitals Group     BP      Pulse      Resp      Temp      Temp src      SpO2      Weight      Height      Head Circumference      Peak Flow      Pain  Score      Pain Loc      Pain Edu?      Excl. in [place]?     Constitutional: Pleasantly confused.  Knows his name and where he is but not the date.  No acute distress Eyes: PERRL EOMI. mid range and brisk Head: Atraumatic. Nose: No congestion/rhinnorhea. Mouth/Throat: No trismus Neck: No stridor.   Cardiovascular: Normal rate, regular rhythm. Grossly normal heart sounds.  Good peripheral circulation. Respiratory: Normal respiratory effort.  No retractions. Lungs CTAB and moving good air Gastrointestinal: Soft nontender Musculoskeletal: No lower extremity edema   Neurologic:  . No gross focal neurologic deficits are appreciated. Skin:  Skin is warm, dry and intact. No rash noted. Psychiatric: Delirious    ____________________________________________   DIFFERENTIAL includes but not limited to  Dementia, delirium, urinary tract infection, metabolic derangement, hypercapnia ____________________________________________   LABS (all labs ordered are listed, but only abnormal results are displayed)  Labs Reviewed  URINALYSIS, COMPLETE (UACMP) WITH MICROSCOPIC - Abnormal; Notable for the following components:      Result Value   Color, Urine YELLOW (*)    APPearance CLEAR (*)    All other components within normal limits  COMPREHENSIVE METABOLIC  PANEL - Abnormal; Notable for the following components:   Sodium 131 (*)    Chloride 96 (*)    Total Protein 6.2 (*)    ALT 11 (*)    All other components within normal limits  CBC - Abnormal; Notable for the following components:   RBC 3.82 (*)    Hemoglobin 12.4 (*)    HCT 35.9 (*)    All other components within normal limits  URINE DRUG SCREEN, QUALITATIVE ([facility] ONLY)  TROPONIN I  AMMONIA  SODIUM, URINE, RANDOM  CREATININE, URINE, RANDOM  ETHANOL  DIFFERENTIAL  CBC WITH DIFFERENTIAL/PLATELET  UREA NITROGEN, URINE    Lab work reviewed by me with no acute disease noted __________________________________________  EKG  ED ECG REPORT I, Darel Hong, the attending physician, personally viewed and interpreted this ECG.  Date: 01/04/2018 EKG Time:  Rate: 67 Rhythm: normal sinus rhythm QRS Axis: Rightward axis Intervals: normal ST/T Wave abnormalities: normal Narrative Interpretation: no evidence of acute ischemia  ____________________________________________  RADIOLOGY  CT reviewed by me with no acute disease Chest x-ray reviewed by me with no focal infiltrate ____________________________________________   PROCEDURES  Procedure(s) performed: no  Procedures  Critical Care performed: no  Observation: no ____________________________________________   INITIAL IMPRESSION / ASSESSMENT AND PLAN / ED COURSE  Pertinent labs & imaging results that were available during my care of the patient were reviewed by me and considered in my medical decision making (see chart for details).  The patient arrives hemodynamically stable and pleasantly confused.  Core temperature is normal.  Differentials extremely broad and in and out urinalysis and broad labs are pending.     ----------------------------------------- 6:59 AM on 01/04/2018 -----------------------------------------  The patient's daughter and wife are at bedside.  The patient is now back to his normal  baseline.  According to the family the patient has a long history of sundowning at night.  The patient would like to go home and the family is comfortable with this.  He will be discharged home with primary care follow-up.  Strict return precautions have been given. ____________________________________________   FINAL CLINICAL IMPRESSION(S) / ED DIAGNOSES  Final diagnoses:  Sundowning      NEW MEDICATIONS STARTED DURING THIS VISIT:  Discharge Medication List as of 01/04/2018  6:57 AM  Note:  This document was prepared using Dragon voice recognition software and may include unintentional dictation errors.     Darel Hong, MD 01/04/18 [phone]

## 2018-01-04 NOTE — ED Notes (Signed)
Kirke Shaggy RN and Delicia, RN assisted to dress and to help pt into car upon discharge.

## 2018-01-04 NOTE — ED Triage Notes (Signed)
Pt arrived via EMS from home d/t waking up in a confused state. EMS reports pt wife explained episodes similar would occur every few months but this has been recurrent x3days. Pt is ambulatory at baseline but EMS had to carry him to stretcher. EMS reported family expressed concerns about po intake decreasing.

## 2018-01-04 NOTE — Discharge Instructions (Signed)
It was a pleasure to take care of you today, and thank you for coming to our emergency department.  If you have any questions or concerns before leaving please ask the nurse to grab me and I'm more than happy to go through your aftercare instructions again.  If you were prescribed any opioid pain medication today such as Norco, Vicodin, Percocet, morphine, hydrocodone, or oxycodone please make sure you do not drive when you are taking this medication as it can alter your ability to drive safely.  If you have any concerns once you are home that you are not improving or are in fact getting worse before you can make it to your follow-up appointment, please do not hesitate to call 911 and come back for further evaluation.  Darel Hong, MD  Results for orders placed or performed during the hospital encounter of 01/04/18  Urinalysis, Complete w Microscopic  Result Value Ref Range   Color, Urine YELLOW (A) YELLOW   APPearance CLEAR (A) CLEAR   Specific Gravity, Urine 1.008 1.005 - 1.030   pH 7.0 5.0 - 8.0   Glucose, UA NEGATIVE NEGATIVE mg/dL   Hgb urine dipstick NEGATIVE NEGATIVE   Bilirubin Urine NEGATIVE NEGATIVE   Ketones, ur NEGATIVE NEGATIVE mg/dL   Protein, ur NEGATIVE NEGATIVE mg/dL   Nitrite NEGATIVE NEGATIVE   Leukocytes, UA NEGATIVE NEGATIVE  Urine Drug Screen, Qualitative  Result Value Ref Range   Tricyclic, Ur Screen NONE DETECTED NONE DETECTED   Amphetamines, Ur Screen NONE DETECTED NONE DETECTED   MDMA (Ecstasy)Ur Screen NONE DETECTED NONE DETECTED   Cocaine Metabolite,Ur [place] NONE DETECTED NONE DETECTED   Opiate, Ur Screen NONE DETECTED NONE DETECTED   Phencyclidine (PCP) Ur S NONE DETECTED NONE DETECTED   Cannabinoid 50 Ng, Ur [place] NONE DETECTED NONE DETECTED   Barbiturates, Ur Screen NONE DETECTED NONE DETECTED   Benzodiazepine, Ur Scrn NONE DETECTED NONE DETECTED   Methadone Scn, Ur NONE DETECTED NONE DETECTED  Comprehensive metabolic panel  Result Value Ref Range   Sodium 131 (L) 135 - 145 mmol/L   Potassium 4.4 3.5 - 5.1 mmol/L   Chloride 96 (L) 101 - 111 mmol/L   CO2 30 22 - 32 mmol/L   Glucose, Bld 71 65 - 99 mg/dL   BUN 18 6 - 20 mg/dL   Creatinine, Ser 0.96 0.61 - 1.24 mg/dL   Calcium 8.9 8.9 - 10.3 mg/dL   Total Protein 6.2 (L) 6.5 - 8.1 g/dL   Albumin 3.5 3.5 - 5.0 g/dL   AST 21 15 - 41 U/L   ALT 11 (L) 17 - 63 U/L   Alkaline Phosphatase 53 38 - 126 U/L   Total Bilirubin 0.5 0.3 - 1.2 mg/dL   GFR calc non Af Amer >60 >60 mL/min   GFR calc Af Amer >60 >60 mL/min   Anion gap 5 5 - 15  Troponin I  Result Value Ref Range   Troponin I <0.03 <0.03 ng/mL  Ammonia  Result Value Ref Range   Ammonia 19 9 - 35 umol/L  Sodium, urine, random  Result Value Ref Range   Sodium, Ur 67 mmol/L  Creatinine, urine, random  Result Value Ref Range   Creatinine, Urine 52 mg/dL  CBC  Result Value Ref Range   WBC 6.5 3.8 - 10.6 K/uL   RBC 3.82 (L) 4.40 - 5.90 MIL/uL   Hemoglobin 12.4 (L) 13.0 - 18.0 g/dL   HCT 35.9 (L) 40.0 - 52.0 %   MCV 93.9 80.0 -  100.0 fL   MCH 32.5 26.0 - 34.0 pg   MCHC 34.6 32.0 - 36.0 g/dL   RDW 13.4 11.5 - 14.5 %   Platelets 265 150 - 440 K/uL  Differential  Result Value Ref Range   Neutrophils Relative % 60 %   Neutro Abs 3.9 1.4 - 6.5 K/uL   Lymphocytes Relative 22 %   Lymphs Abs 1.4 1.0 - 3.6 K/uL   Monocytes Relative 12 %   Monocytes Absolute 0.8 0.2 - 1.0 K/uL   Eosinophils Relative 5 %   Eosinophils Absolute 0.3 0 - 0.7 K/uL   Basophils Relative 1 %   Basophils Absolute 0.0 0 - 0.1 K/uL   Dg Abd 1 View  Result Date: 12/17/2017 CLINICAL DATA:  Follow-up probable ileus. EXAM: ABDOMEN - 1 VIEW COMPARISON:  Yesterday. FINDINGS: Nasogastric tube tip and side hole in the proximal stomach. Increased diameter of multiple gas-filled loops of colon and small bowel. Excreted contrast in the urinary bladder and bladder diverticula. Small amount of oral contrast within stool in the hepatic flexure. The lumbar and lower  thoracic spine degenerative changes and mild rotary scoliosis. Right hip prosthesis. IMPRESSION: Mildly progressive colonic small bowel ileus. Electronically Signed   By: Claudie Revering M.D.   On: 12/17/2017 11:12   Ct Head Wo Contrast  Result Date: 01/04/2018 CLINICAL DATA:  Altered level of consciousness.  Confusion. EXAM: CT HEAD WITHOUT CONTRAST TECHNIQUE: Contiguous axial images were obtained from the base of the skull through the vertex without intravenous contrast. COMPARISON:  03/08/2017 FINDINGS: Brain: Stable degree of atrophy and chronic small vessel ischemia. No intracranial hemorrhage, mass effect, or midline shift. No hydrocephalus. The basilar cisterns are patent. No evidence of territorial infarct or acute ischemia. No extra-axial or intracranial fluid collection. Vascular: Atherosclerosis of skullbase vasculature without hyperdense vessel or abnormal calcification. Skull: No fracture or focal lesion. Sinuses/Orbits: Paranasal sinuses and mastoid air cells are clear. The visualized orbits are unremarkable. Other: None. IMPRESSION: 1.  No acute intracranial abnormality. 2. Stable degree of atrophy and chronic small vessel ischemia. Electronically Signed   By: Jeb Levering M.D.   On: 01/04/2018 05:48   Ct Abdomen Pelvis W Contrast  Result Date: 12/16/2017 CLINICAL DATA:  Severe abdominal pain and difficulty with bowel movements. EXAM: CT ABDOMEN AND PELVIS WITH CONTRAST TECHNIQUE: Multidetector CT imaging of the abdomen and pelvis was performed using the standard protocol following bolus administration of intravenous contrast. CONTRAST:  137mL ISOVUE-300 IOPAMIDOL (ISOVUE-300) INJECTION 61% COMPARISON:  08/14/2017 FINDINGS: Lower chest: Normal heart size with coronary arteriosclerosis and aortic atherosclerosis. No pericardial effusion. No active pulmonary disease. Atelectasis noted at the right lung base. Hepatobiliary: Layering gallstones within the gallbladder without secondary signs of  acute cholecystitis. No intrahepatic nor extrahepatic biliary dilatation. Homogeneous enhancement of the liver. Pancreas: No focal pancreatic mass. Visualization of the main pancreatic duct which is normal in caliber and less than 3 mm. Spleen: Normal size spleen without mass. Adrenals/Urinary Tract: Normal bilateral adrenal glands. Stable upper pole right renal simple cyst measuring 17 mm on current study. Smaller interpolar right renal cyst too small to further characterize measuring 4 mm. Similar finding in the lower pole of left kidney. No nephrolithiasis nor obstructive uropathy. Mild circumferential thickening of the urinary bladder a may reflect mild cystitis. Right-sided bladder diverticulosis. No focal mural thickening of the bladder or calculus. Stomach/Bowel: The stomach is physiologically distended with ingested oral contrast. A small hyperplastic polyp is seen measuring 3 mm, series 5/45, series 2/39  in the distal second portion of the duodenum. Small bowel crosses midline and normal ligament of Treitz position is demonstrated. There is swirling of the central mesentery without definite evidence of small bowel volvulus given fluid and air distention of distal small intestine to the terminal ileum. Gas and stool noted within large bowel as well without focal mural thickening. No pneumatosis or portal venous gas. Colonic interposition is present over the liver. A large amount of retained stool is seen along the descending colon. Vascular/Lymphatic: Aortoiliac atherosclerosis.  No lymphadenopathy. Reproductive: Mild prostatic enlargement. Seminal vesicles are unremarkable. Other: Mesenteric edema with small to moderate volume of free fluid in the abdomen and pelvis is noted. Musculoskeletal: Degenerative disc disease with vacuum disc phenomenon and moderate to marked disc space flattening from L2 through L5. No acute nor suspicious osseous abnormality. IMPRESSION: 1. Nonspecific fluid and gas-filled  distention of small and large bowel without definite source of mechanical obstruction, pneumatosis, nor portal venous gas. There is some swirling of the central mesentery which can be seen in small bowel volvulus however, given the presence of gas and fluid within distal small bowel to the level of the terminal ileum, this is believed less likely. This finding can also be seen with a floppy mesentery and normal variance. A severe enteritis might account for the fluid in gaseous distention of small bowel appearance and explain some of the mesenteric edema and small volume free fluid in the abdomen and pelvis. Would recommend follow-up radiographs of the abdomen to assure that the ingested oral contrast passes into the colon and to exclude the possibility of a small bowel obstruction or volvulus (both believed less likely). 2. 3 mm filling defect within the second portion of the duodenum likely to represent a tiny hyperplastic polyp. 3. Uncomplicated cholelithiasis. 4. Stable lumbar spondylosis. Electronically Signed   By: Ashley Royalty M.D.   On: 12/16/2017 20:39   Dg Chest Port 1 View  Result Date: 01/04/2018 CLINICAL DATA:  Weakness and shortness of breath. EXAM: PORTABLE CHEST 1 VIEW COMPARISON:  Radiograph 12/16/2017 FINDINGS: The cardiomediastinal contours are normal. Chronic blunting of right costophrenic angle. Mild left basilar atelectasis. Pulmonary vasculature is normal. No consolidation, pleural effusion, or pneumothorax. No acute osseous abnormalities are seen. IMPRESSION: Mild left basilar atelectasis. Electronically Signed   By: Jeb Levering M.D.   On: 01/04/2018 05:56   Dg Abdomen Acute W/chest  Result Date: 12/16/2017 CLINICAL DATA:  Abdominal pain. EXAM: DG ABDOMEN ACUTE W/ 1V CHEST COMPARISON:  CT of the abdomen and pelvis August 14, 2017 and chest x-ray August 14, 2017 FINDINGS: Air under the right left diaphragm is consistent with air in colon and stomach. This is been seen on  previous studies. No pneumothorax. No pulmonary nodules or masses. No focal infiltrates. The cardiomediastinal silhouette is normal. No free air identified. Air-filled prominent loops of large and small bowel suggest ileus rather than obstruction. No other acute abnormalities identified. IMPRESSION: Apparent ileus in the abdomen.  No other acute abnormalities. Electronically Signed   By: Dorise Bullion III M.D   On: 12/16/2017 19:33   Dg Abd Portable 1 View  Result Date: 12/16/2017 CLINICAL DATA:  NG tube placement. EXAM: PORTABLE ABDOMEN - 1 VIEW COMPARISON:  12/16/2017 at 19:08 p.m. FINDINGS: There is been interval placement of enteric tube which has tip over the stomach in the left upper quadrant and side-port in the region of the gastroesophageal junction. Lung bases unremarkable. No significant change in multiple air-filled large and small bowel  loops. Remainder of the exam is unchanged. IMPRESSION: No change in multiple air-filled large and small bowel loops likely ileus. Enteric tube with tip over the stomach in the left upper quadrant. Electronically Signed   By: Marin Olp M.D.   On: 12/16/2017 21:43

## 2018-01-05 ENCOUNTER — Other Ambulatory Visit: Payer: Self-pay | Admitting: Internal Medicine

## 2018-01-05 DIAGNOSIS — E871 Hypo-osmolality and hyponatremia: Secondary | ICD-10-CM

## 2018-01-05 LAB — UREA NITROGEN, URINE: Urea Nitrogen, Ur: 357 mg/dL

## 2018-01-05 NOTE — Progress Notes (Signed)
Order placed for referral to nephrology.

## 2018-01-06 ENCOUNTER — Emergency Department: Payer: Medicare Other

## 2018-01-06 ENCOUNTER — Other Ambulatory Visit: Payer: Self-pay

## 2018-01-06 ENCOUNTER — Observation Stay
Admission: EM | Admit: 2018-01-06 | Discharge: 2018-01-08 | Disposition: A | Discharge status: Home / Self Care | Payer: Medicare Other | Attending: Internal Medicine | Admitting: Internal Medicine

## 2018-01-06 ENCOUNTER — Encounter: Payer: Self-pay | Admitting: Emergency Medicine

## 2018-01-06 DIAGNOSIS — D649 Anemia, unspecified: Secondary | ICD-10-CM | POA: Insufficient documentation

## 2018-01-06 DIAGNOSIS — E039 Hypothyroidism, unspecified: Secondary | ICD-10-CM | POA: Diagnosis not present

## 2018-01-06 DIAGNOSIS — F0150 Vascular dementia without behavioral disturbance: Secondary | ICD-10-CM | POA: Insufficient documentation

## 2018-01-06 DIAGNOSIS — Z79899 Other long term (current) drug therapy: Secondary | ICD-10-CM | POA: Insufficient documentation

## 2018-01-06 DIAGNOSIS — N138 Other obstructive and reflux uropathy: Secondary | ICD-10-CM | POA: Insufficient documentation

## 2018-01-06 DIAGNOSIS — I10 Essential (primary) hypertension: Secondary | ICD-10-CM | POA: Insufficient documentation

## 2018-01-06 DIAGNOSIS — G309 Alzheimer's disease, unspecified: Secondary | ICD-10-CM | POA: Diagnosis not present

## 2018-01-06 DIAGNOSIS — R4182 Altered mental status, unspecified: Secondary | ICD-10-CM

## 2018-01-06 DIAGNOSIS — E860 Dehydration: Secondary | ICD-10-CM | POA: Insufficient documentation

## 2018-01-06 DIAGNOSIS — J449 Chronic obstructive pulmonary disease, unspecified: Secondary | ICD-10-CM | POA: Diagnosis not present

## 2018-01-06 DIAGNOSIS — G9340 Encephalopathy, unspecified: Principal | ICD-10-CM | POA: Insufficient documentation

## 2018-01-06 DIAGNOSIS — E871 Hypo-osmolality and hyponatremia: Secondary | ICD-10-CM | POA: Insufficient documentation

## 2018-01-06 DIAGNOSIS — Z87891 Personal history of nicotine dependence: Secondary | ICD-10-CM | POA: Insufficient documentation

## 2018-01-06 DIAGNOSIS — Z7951 Long term (current) use of inhaled steroids: Secondary | ICD-10-CM | POA: Diagnosis not present

## 2018-01-06 DIAGNOSIS — N401 Enlarged prostate with lower urinary tract symptoms: Secondary | ICD-10-CM | POA: Insufficient documentation

## 2018-01-06 DIAGNOSIS — Z79890 Hormone replacement therapy (postmenopausal): Secondary | ICD-10-CM | POA: Diagnosis not present

## 2018-01-06 DIAGNOSIS — Z96641 Presence of right artificial hip joint: Secondary | ICD-10-CM | POA: Insufficient documentation

## 2018-01-06 DIAGNOSIS — K529 Noninfective gastroenteritis and colitis, unspecified: Secondary | ICD-10-CM | POA: Insufficient documentation

## 2018-01-06 DIAGNOSIS — F0280 Dementia in other diseases classified elsewhere without behavioral disturbance: Secondary | ICD-10-CM | POA: Insufficient documentation

## 2018-01-06 DIAGNOSIS — E1142 Type 2 diabetes mellitus with diabetic polyneuropathy: Secondary | ICD-10-CM | POA: Diagnosis not present

## 2018-01-06 LAB — URINALYSIS, COMPLETE (UACMP) WITH MICROSCOPIC
BILIRUBIN URINE: NEGATIVE
Bacteria, UA: NONE SEEN
GLUCOSE, UA: NEGATIVE mg/dL
Hgb urine dipstick: NEGATIVE
KETONES UR: NEGATIVE mg/dL
LEUKOCYTES UA: NEGATIVE
NITRITE: NEGATIVE
PH: 7 (ref 5.0–8.0)
Protein, ur: NEGATIVE mg/dL
Specific Gravity, Urine: 1.006 (ref 1.005–1.030)

## 2018-01-06 LAB — CBC WITH DIFFERENTIAL/PLATELET
BASOS ABS: 0 10*3/uL (ref 0–0.1)
BASOS PCT: 1 %
EOS PCT: 4 %
Eosinophils Absolute: 0.2 10*3/uL (ref 0–0.7)
HEMATOCRIT: 34.3 % — AB (ref 40.0–52.0)
Hemoglobin: 11.6 g/dL — ABNORMAL LOW (ref 13.0–18.0)
Lymphocytes Relative: 18 %
Lymphs Abs: 1.1 10*3/uL (ref 1.0–3.6)
MCH: 32.3 pg (ref 26.0–34.0)
MCHC: 34 g/dL (ref 32.0–36.0)
MCV: 95 fL (ref 80.0–100.0)
MONO ABS: 0.7 10*3/uL (ref 0.2–1.0)
Monocytes Relative: 11 %
Neutro Abs: 3.9 10*3/uL (ref 1.4–6.5)
Neutrophils Relative %: 66 %
PLATELETS: 273 10*3/uL (ref 150–440)
RBC: 3.61 MIL/uL — AB (ref 4.40–5.90)
RDW: 13.4 % (ref 11.5–14.5)
WBC: 5.9 10*3/uL (ref 3.8–10.6)

## 2018-01-06 LAB — BLOOD GAS, ARTERIAL
ACID-BASE EXCESS: 6.1 mmol/L — AB (ref 0.0–2.0)
BICARBONATE: 30.6 mmol/L — AB (ref 20.0–28.0)
FIO2: 0.21
O2 SAT: 98.5 %
PATIENT TEMPERATURE: 37
PO2 ART: 108 mmHg (ref 83.0–108.0)
pCO2 arterial: 43 mmHg (ref 32.0–48.0)
pH, Arterial: 7.46 — ABNORMAL HIGH (ref 7.350–7.450)

## 2018-01-06 LAB — TROPONIN I

## 2018-01-06 LAB — LACTIC ACID, PLASMA: LACTIC ACID, VENOUS: 0.8 mmol/L (ref 0.5–1.9)

## 2018-01-06 MED ORDER — IOHEXOL 300 MG/ML  SOLN
100.0000 mL | Freq: Once | INTRAMUSCULAR | Status: AC | PRN
  Administered 2018-01-06: 100 mL via INTRAVENOUS

## 2018-01-06 MED ORDER — SODIUM CHLORIDE 0.9 % IV BOLUS
1000.0000 mL | Freq: Once | INTRAVENOUS | Status: AC
  Administered 2018-01-06: 1000 mL via INTRAVENOUS

## 2018-01-06 MED ORDER — IOPAMIDOL (ISOVUE-300) INJECTION 61%
30.0000 mL | Freq: Once | INTRAVENOUS | Status: AC
  Administered 2018-01-06: 30 mL via ORAL

## 2018-01-06 NOTE — ED Provider Notes (Addendum)
[facility] Emergency Department Provider Note   ____________________________________________   First MD Initiated Contact with Patient 01/06/18 1857     (approximate)  I have reviewed the triage vital signs and the nursing notes.   HISTORY  Chief Complaint Altered Mental Status   HPI Aaron Hendrix is a 84 y.o. male Who family brings from home reporting increased irritability and confusion. this is been getting worse for the last few days. Patient occasionally has UTIs and occasionally has low sodium or high CO2 and this makes things get worse. Patient reports burning when he urinates at least he seems to its somewhat difficult to get a history out of him. Patient's wife and son are with him and help obtaining history.  Past Medical History:  Diagnosis Date  . Carpal tunnel syndrome   . COPD with asthma ([place])   . Diabetes mellitus    Diet control   . DJD (degenerative joint disease), cervical   . Duodenal ulcer, with partial obstruction 08/10/2012  . Essential and other specified forms of tremor 12/16/2013  . Hyperlipidemia   . Hypertension    no medicine needed  . IBS (irritable bowel syndrome)   . Internal hemorrhoids   . Memory deficit 12/16/2013  . Numbness and tingling in right hand    started 2 yeas ago  . Peptic ulcer   . Personal history of colonic polyps    adenomatous  . Polyneuropathy in diabetes(357.2)   . Tremor, essential   . Vitamin D deficiency     Patient Active Problem List   Diagnosis Date Noted  . Paralytic ileus ([place])   . Anemia 12/12/2017  . Healthcare maintenance 09/14/2017  . Respiratory failure with hypoxia and hypercapnia ([place]) 03/19/2017  . Palliative care by specialist 03/09/2017  . Hypercapnia   . Acute on chronic respiratory failure with hypercapnia ([place]) 03/08/2017  . Hypoxia 11/25/2016  . COPD exacerbation ([place]) 11/25/2016  . Rash 01/18/2016  . Nocturia 08/11/2015  . BPH with obstruction/lower  urinary tract symptoms 08/11/2015  . Hematemesis 07/29/2015  . Ileus ([place]) 07/29/2015  . Lower extremity edema 07/16/2015  . Increased urinary frequency 07/16/2015  . COPD (chronic obstructive pulmonary disease) ([place]) 07/16/2015  . Community acquired pneumonia   . Hyponatremia 06/25/2015  . Skin lesion 02/16/2015  . DNR (do not resuscitate) discussion 02/16/2015  . Elbow abrasion 09/27/2014  . Hypothyroidism 09/27/2014  . Chronic constipation 09/01/2014  . Trigger thumb 06/22/2014  . Abdominal pain 06/22/2014  . Personal history of other specified conditions 02/20/2014  . Decreased anal sphincter tone 02/19/2014  . Other specified symptoms and signs involving the digestive system and abdomen 02/19/2014  . Diabetes mellitus ([place]) 02/19/2014  . Memory deficit 12/16/2013  . Essential and other specified forms of tremor 12/16/2013  . Amnesia 12/16/2013  . Diarrhea 10/24/2013  . Loss of weight 09/10/2013  . Periumbilical abdominal pain 09/10/2013  . Pain in lower limb 07/01/2013  . Dermatophytosis of foot 07/01/2013  . Degenerative arthritis of hip 11/13/2012  . Osteoarthritis of hip 11/13/2012  . DU (duodenal ulcer) 08/22/2012  . Nausea 08/10/2012  . Hypertension 05/25/2011  . Diabetes mellitus, type II ([place]) 05/25/2011  . Irritable bowel syndrome 03/31/2008  . History of colonic polyps 03/31/2008  . Adaptive colitis 03/31/2008    Past Surgical History:  Procedure Laterality Date  . ANAL FISSURE REPAIR    . ANKLE SURGERY Right    right- pins placed in  . COLONOSCOPY W/ BIOPSIES AND POLYPECTOMY  8/03, 6/05, 7/09, 9/10   internal hemorrhoids, tubular adenomas, mucosa & lymphoid nodules  . INGUINAL HERNIA REPAIR Right   . TOTAL HIP ARTHROPLASTY Right 11/13/2012   Procedure: TOTAL HIP ARTHROPLASTY ANTERIOR APPROACH;  Surgeon: Mcarthur Rossetti, MD;  Location: [place];  Service: Orthopedics;  Laterality: Right;  Right total hip arthroplasty  . UPPER GASTROINTESTINAL ENDOSCOPY   3/05, 7/09, 9/10,2013   gastritis, duodenitis    Prior to Admission medications   Medication Sig Start Date End Date Taking? Authorizing Provider  Cholecalciferol (VITAMIN D3) 50000 units CAPS Take 50,000 Units by mouth once a week.   Yes [provider]  finasteride (PROSCAR) 5 MG tablet Take 1 tablet (5 mg total) by mouth daily. 11/22/17  Yes [name], [name], PA-C  fluticasone (FLONASE) 50 MCG/ACT nasal spray Place 2 sprays into both nostrils daily. 09/12/17  Yes [name], MD  furosemide (LASIX) 40 MG tablet Take 40 mg by mouth daily.   Yes [provider]  levothyroxine (SYNTHROID, LEVOTHROID) 50 MCG tablet Take 1 tablet (50 mcg total) by mouth daily before breakfast. 12/02/16  Yes [name], MD  losartan (COZAAR) 50 MG tablet TAKE 1 TABLET BY MOUTH ONCE DAILY 04/26/17  Yes [name], MD  magnesium oxide (MAG-OX) 400 (241.3 Mg) MG tablet TAKE 1 TABLET BY MOUTH ONCE DAILY 01/02/18  Yes [name], MD  pantoprazole (PROTONIX) 40 MG tablet TAKE 1 TABLET BY MOUTH ONCE DAILY 09/11/17  Yes [name], MD  PROAIR HFA [phone] Base) MCG/ACT inhaler INHALE 1-2 PUFFS INTO THE LUNGS EVERY 6 HOURS AS NEEDED FOR WHEEZING OR SHORTNESS OF BREATH 09/11/17  Yes [name], MD  saccharomyces boulardii (FLORASTOR) 250 MG capsule Take 250 mg by mouth as needed.    Yes [provider]  tamsulosin (FLOMAX) 0.4 MG CAPS capsule Take 1 capsule (0.4 mg total) by mouth daily. 11/22/17  Yes [name], [name], PA-C  umeclidinium-vilanterol (ANORO ELLIPTA) 62.5-25 MCG/INH AEPB Inhale 1 puff into the lungs daily. 04/03/17  Yes [name], MD    Allergies Fexofenadine; Quinapril hcl; and Latex  Family History  Problem Relation Age of Onset  . Thyroid cancer Daughter 42  . Diabetes Son   . Urolithiasis Son   . Leukemia Maternal Aunt   . Colon cancer Neg Hx   . Esophageal cancer Neg Hx   . Rectal cancer Neg Hx   . Stomach cancer Neg Hx   . Prostate  cancer Neg Hx   . Kidney disease Neg Hx   . Kidney cancer Neg Hx   . Bladder Cancer Neg Hx     Social History Social History   Tobacco Use  . Smoking status: Former Smoker    Types: Cigarettes    Last attempt to quit: 07/02/1963    Years since quitting: 54.5  . Smokeless tobacco: Never Used  Substance Use Topics  . Alcohol use: No    Alcohol/week: 0.0 oz  . Drug use: No    Review of Systems  Constitutional: No fever/chills Eyes: No visual changes. ENT: No sore throat. Cardiovascular: Denies chest pain. Respiratory: Denies shortness of breath. Gastrointestinal: No abdominal pain.  No nausea, no vomiting.  No diarrhea.  No constipation. Genitourinary:  Dysuria? Musculoskeletal: Negative for back pain. Skin: Negative for rash. Neurological: Negative for headaches, focal weakness ____________________________________________   PHYSICAL EXAM:  VITAL SIGNS: ED Triage Vitals  Enc Vitals Group     BP 01/06/18 1837 (!) 121/55     Pulse  Rate 01/06/18 1837 66     Resp 01/06/18 1837 16     Temp 01/06/18 1837 98.2 F (36.8 C)     Temp Source 01/06/18 1837 Oral     SpO2 01/06/18 1834 99 %     Weight 01/06/18 1838 130 lb (59 kg)     Height 01/06/18 1838 5\' 3"  (1.6 m)     Head Circumference --      Peak Flow --      Pain Score 01/06/18 1837 0     Pain Loc --      Pain Edu? --      Excl. in [place]? --     Constitutional: Alert and orientedto person and hospital. Well appearing and in no acute distress. Eyes: Conjunctivae are normal.  Head: Atraumatic. Nose: No congestion/rhinnorhea. Mouth/Throat: Mucous membranes are moist.   Neck: No stridor.   Cardiovascular: Normal rate, regular rhythm. Grossly normal heart sounds.  Good peripheral circulation. Respiratory: Normal respiratory effort.  No retractions. Lungs CTAB. Gastrointestinal: Soft and nontender. No distention. No abdominal bruits. No CVA tenderness. }Musculoskeletal: No lower extremity tenderness nor edema.  No  joint effusions. Neurologic:  Normal speech and language. No gross focal neurologic deficits are appreciated. No gait instability. Skin:  Skin is warm, dry and intact. No rash noted. Psychiatric: Mood and affect are normal. Speech and behavior are normal.  ____________________________________________   LABS (all labs ordered are listed, but only abnormal results are displayed)  Labs Reviewed  COMPREHENSIVE METABOLIC PANEL - Abnormal; Notable for the following components:      Result Value   Sodium 129 (*)    Chloride 93 (*)    CO2 34 (*)    Calcium 8.8 (*)    Total Protein 6.3 (*)    ALT 12 (*)    Anion gap 2 (*)    All other components within normal limits  CBC WITH DIFFERENTIAL/PLATELET - Abnormal; Notable for the following components:   RBC 3.61 (*)    Hemoglobin 11.6 (*)    HCT 34.3 (*)    All other components within normal limits  URINALYSIS, COMPLETE (UACMP) WITH MICROSCOPIC - Abnormal; Notable for the following components:   Color, Urine YELLOW (*)    APPearance CLEAR (*)    Squamous Epithelial / LPF 0-5 (*)    All other components within normal limits  BLOOD GAS, ARTERIAL - Abnormal; Notable for the following components:   pH, Arterial 7.46 (*)    Bicarbonate 30.6 (*)    Acid-Base Excess 6.1 (*)    All other components within normal limits  TROPONIN I  LACTIC ACID, PLASMA   ____________________________________________  EKG  EKG read interpreted by me shows normal sinus rhythm rate of 62 left axis widened QRS that look suspicious for left bundle branch block nonspecific ST-T wave changes computer is reading inferior ST elevation I do not really see anything significant. ____________________________________________  RADIOLOGY  ED MD interpretation:  Official radiology report(s): Ct Head Wo Contrast  Result Date: 01/06/2018 CLINICAL DATA:  Altered level of consciousness. Confusion. No reported injury. EXAM: CT HEAD WITHOUT CONTRAST TECHNIQUE: Contiguous axial  images were obtained from the base of the skull through the vertex without intravenous contrast. COMPARISON:  01/04/2018 head CT. FINDINGS: Brain: No evidence of parenchymal hemorrhage or extra-axial fluid collection. No mass lesion, mass effect, or midline shift. No CT evidence of acute infarction. Generalized cerebral volume loss. Nonspecific mild to moderate subcortical and periventricular white matter hypodensity, most in keeping with  chronic small vessel ischemic change. Cerebral ventricle sizes are stable and concordant with the degree of cerebral volume loss. Vascular: No acute abnormality. Skull: No evidence of calvarial fracture. Sinuses/Orbits: The visualized paranasal sinuses are essentially clear. Other:  The mastoid air cells are unopacified. IMPRESSION: 1.  No evidence of acute intracranial abnormality. 2. Generalized cerebral volume loss and mild-to-moderate chronic small vessel ischemic changes in the cerebral white matter. Electronically Signed   By: Ilona Sorrel M.D.   On: 01/06/2018 19:56   Dg Chest Portable 1 View  Result Date: 01/06/2018 CLINICAL DATA:  Confusion/altered mental status EXAM: PORTABLE CHEST 1 VIEW COMPARISON:  January 04, 2018 FINDINGS: There is slight elevation of the left hemidiaphragm. There is no edema or consolidation. There is a minimal right pleural effusion. Heart size and pulmonary vascularity are normal. No adenopathy. No pneumothorax. No appreciable bone lesions. IMPRESSION: Minimal right pleural effusion. No edema or consolidation. Stable cardiac silhouette. Electronically Signed   By: Lowella Grip III M.D.   On: 01/06/2018 19:29    ____________________________________________   PROCEDURES  Procedure(s) performed:   Procedures  Critical Care performed:   ____________________________________________   INITIAL IMPRESSION / ASSESSMENT AND PLAN / ED COURSE     Pt signed out to Dr Quentin Cornwall ct pending anticipate admission for AMS         ____________________________________________   FINAL CLINICAL IMPRESSION(S) / ED DIAGNOSES  Final diagnoses:  Altered mental status, unspecified altered mental status type     ED Discharge Orders    None       Note:  This document was prepared using Dragon voice recognition software and may include unintentional dictation errors.    Nena Polio, MD 01/06/18 2344    Nena Polio, MD 01/15/18 1126

## 2018-01-06 NOTE — ED Triage Notes (Signed)
Pt arrives via ems from home, pt's family called 911 due to pt having increased irritability and confusion similar to the other day.

## 2018-01-06 NOTE — ED Notes (Signed)
MD at bedside. 

## 2018-01-06 NOTE — ED Notes (Signed)
RN attempted to get IV and failed twice. Second RN called to attempt.

## 2018-01-07 ENCOUNTER — Other Ambulatory Visit: Payer: Self-pay

## 2018-01-07 DIAGNOSIS — G9340 Encephalopathy, unspecified: Secondary | ICD-10-CM | POA: Diagnosis present

## 2018-01-07 LAB — BASIC METABOLIC PANEL
Anion gap: 3 — ABNORMAL LOW (ref 5–15)
BUN: 15 mg/dL (ref 6–20)
CHLORIDE: 97 mmol/L — AB (ref 101–111)
CO2: 31 mmol/L (ref 22–32)
Calcium: 8.5 mg/dL — ABNORMAL LOW (ref 8.9–10.3)
Creatinine, Ser: 0.89 mg/dL (ref 0.61–1.24)
Glucose, Bld: 72 mg/dL (ref 65–99)
POTASSIUM: 4.2 mmol/L (ref 3.5–5.1)
SODIUM: 131 mmol/L — AB (ref 135–145)

## 2018-01-07 LAB — COMPREHENSIVE METABOLIC PANEL
ALT: 12 U/L — AB (ref 17–63)
ANION GAP: 2 — AB (ref 5–15)
AST: 21 U/L (ref 15–41)
Albumin: 3.7 g/dL (ref 3.5–5.0)
Alkaline Phosphatase: 48 U/L (ref 38–126)
BUN: 18 mg/dL (ref 6–20)
CHLORIDE: 93 mmol/L — AB (ref 101–111)
CO2: 34 mmol/L — ABNORMAL HIGH (ref 22–32)
CREATININE: 0.99 mg/dL (ref 0.61–1.24)
Calcium: 8.8 mg/dL — ABNORMAL LOW (ref 8.9–10.3)
Glucose, Bld: 88 mg/dL (ref 65–99)
Potassium: 4.6 mmol/L (ref 3.5–5.1)
SODIUM: 129 mmol/L — AB (ref 135–145)
Total Bilirubin: 0.4 mg/dL (ref 0.3–1.2)
Total Protein: 6.3 g/dL — ABNORMAL LOW (ref 6.5–8.1)

## 2018-01-07 LAB — CBC
HEMATOCRIT: 35.2 % — AB (ref 40.0–52.0)
Hemoglobin: 11.7 g/dL — ABNORMAL LOW (ref 13.0–18.0)
MCH: 31.4 pg (ref 26.0–34.0)
MCHC: 33.2 g/dL (ref 32.0–36.0)
MCV: 94.6 fL (ref 80.0–100.0)
Platelets: 256 10*3/uL (ref 150–440)
RBC: 3.72 MIL/uL — AB (ref 4.40–5.90)
RDW: 13.5 % (ref 11.5–14.5)
WBC: 7.2 10*3/uL (ref 3.8–10.6)

## 2018-01-07 LAB — GLUCOSE, CAPILLARY: Glucose-Capillary: 65 mg/dL (ref 65–99)

## 2018-01-07 LAB — TSH: TSH: 2.285 u[IU]/mL (ref 0.350–4.500)

## 2018-01-07 MED ORDER — MAGNESIUM OXIDE 400 (241.3 MG) MG PO TABS
400.0000 mg | ORAL_TABLET | Freq: Every day | ORAL | Status: DC
  Administered 2018-01-07 – 2018-01-08 (×2): 400 mg via ORAL
  Filled 2018-01-07 (×2): qty 1

## 2018-01-07 MED ORDER — VITAMIN D (ERGOCALCIFEROL) 1.25 MG (50000 UNIT) PO CAPS
50000.0000 [IU] | ORAL_CAPSULE | ORAL | Status: DC
  Administered 2018-01-08: 50000 [IU] via ORAL
  Filled 2018-01-07: qty 1

## 2018-01-07 MED ORDER — HEPARIN SODIUM (PORCINE) 5000 UNIT/ML IJ SOLN
5000.0000 [IU] | Freq: Three times a day (TID) | INTRAMUSCULAR | Status: DC
  Administered 2018-01-07 – 2018-01-08 (×4): 5000 [IU] via SUBCUTANEOUS
  Filled 2018-01-07 (×4): qty 1

## 2018-01-07 MED ORDER — SALINE SPRAY 0.65 % NA SOLN
1.0000 | NASAL | Status: DC | PRN
  Filled 2018-01-07: qty 44

## 2018-01-07 MED ORDER — BISACODYL 5 MG PO TBEC: 5.0000 mg | DELAYED_RELEASE_TABLET | Freq: Every day | ORAL | Status: DC | PRN

## 2018-01-07 MED ORDER — ONDANSETRON HCL 4 MG/2ML IJ SOLN: 4.0000 mg | Freq: Four times a day (QID) | INTRAMUSCULAR | Status: DC | PRN

## 2018-01-07 MED ORDER — TRAZODONE HCL 50 MG PO TABS: 25.0000 mg | ORAL_TABLET | Freq: Every evening | ORAL | Status: DC | PRN

## 2018-01-07 MED ORDER — DOCUSATE SODIUM 100 MG PO CAPS
100.0000 mg | ORAL_CAPSULE | Freq: Two times a day (BID) | ORAL | Status: DC
Start: 2018-01-07 — End: 2018-01-08
  Administered 2018-01-07 – 2018-01-08 (×3): 100 mg via ORAL
  Filled 2018-01-07 (×3): qty 1

## 2018-01-07 MED ORDER — LOSARTAN POTASSIUM 50 MG PO TABS
50.0000 mg | ORAL_TABLET | Freq: Every day | ORAL | Status: DC
  Administered 2018-01-07 – 2018-01-08 (×2): 50 mg via ORAL
  Filled 2018-01-07 (×2): qty 1

## 2018-01-07 MED ORDER — FUROSEMIDE 40 MG PO TABS: 40.0000 mg | ORAL_TABLET | Freq: Every day | ORAL | Status: DC

## 2018-01-07 MED ORDER — PANTOPRAZOLE SODIUM 40 MG PO TBEC
40.0000 mg | DELAYED_RELEASE_TABLET | Freq: Every day | ORAL | Status: DC
  Administered 2018-01-07 – 2018-01-08 (×2): 40 mg via ORAL
  Filled 2018-01-07 (×2): qty 1

## 2018-01-07 MED ORDER — UMECLIDINIUM-VILANTEROL 62.5-25 MCG/INH IN AEPB
1.0000 | INHALATION_SPRAY | Freq: Every day | RESPIRATORY_TRACT | Status: DC
  Administered 2018-01-07 – 2018-01-08 (×2): 1 via RESPIRATORY_TRACT
  Filled 2018-01-07: qty 14

## 2018-01-07 MED ORDER — TAMSULOSIN HCL 0.4 MG PO CAPS
0.4000 mg | ORAL_CAPSULE | Freq: Every day | ORAL | Status: DC
  Administered 2018-01-07 – 2018-01-08 (×2): 0.4 mg via ORAL
  Filled 2018-01-07 (×2): qty 1

## 2018-01-07 MED ORDER — ACETAMINOPHEN 325 MG PO TABS: 650.0000 mg | ORAL_TABLET | Freq: Four times a day (QID) | ORAL | Status: DC | PRN

## 2018-01-07 MED ORDER — LEVOTHYROXINE SODIUM 50 MCG PO TABS
50.0000 ug | ORAL_TABLET | Freq: Every day | ORAL | Status: DC
  Administered 2018-01-07 – 2018-01-08 (×2): 50 ug via ORAL
  Filled 2018-01-07 (×2): qty 1

## 2018-01-07 MED ORDER — POLYVINYL ALCOHOL 1.4 % OP SOLN
1.0000 [drp] | OPHTHALMIC | Status: DC | PRN
  Filled 2018-01-07: qty 15

## 2018-01-07 MED ORDER — HYDROCODONE-ACETAMINOPHEN 5-325 MG PO TABS: 1.0000 | ORAL_TABLET | ORAL | Status: DC | PRN

## 2018-01-07 MED ORDER — SACCHAROMYCES BOULARDII 250 MG PO CAPS
250.0000 mg | ORAL_CAPSULE | Freq: Two times a day (BID) | ORAL | Status: DC
  Administered 2018-01-07 – 2018-01-08 (×3): 250 mg via ORAL
  Filled 2018-01-07 (×3): qty 1

## 2018-01-07 MED ORDER — VITAMIN D3 1.25 MG (50000 UT) PO CAPS: 50000.0000 [IU] | ORAL_CAPSULE | ORAL | Status: DC

## 2018-01-07 MED ORDER — FLUTICASONE PROPIONATE 50 MCG/ACT NA SUSP
2.0000 | Freq: Every day | NASAL | Status: DC
Start: 2018-01-07 — End: 2018-01-08
  Administered 2018-01-07 – 2018-01-08 (×2): 2 via NASAL
  Filled 2018-01-07: qty 16

## 2018-01-07 MED ORDER — FINASTERIDE 5 MG PO TABS
5.0000 mg | ORAL_TABLET | Freq: Every day | ORAL | Status: DC
  Administered 2018-01-07 – 2018-01-08 (×2): 5 mg via ORAL
  Filled 2018-01-07 (×2): qty 1

## 2018-01-07 MED ORDER — SODIUM CHLORIDE 0.9 % IV SOLN
Freq: Once | INTRAVENOUS | Status: AC
  Administered 2018-01-07: 03:00:00 via INTRAVENOUS

## 2018-01-07 MED ORDER — ALBUTEROL SULFATE (2.5 MG/3ML) 0.083% IN NEBU: 2.5000 mg | INHALATION_SOLUTION | Freq: Four times a day (QID) | RESPIRATORY_TRACT | Status: DC | PRN

## 2018-01-07 MED ORDER — ACETAMINOPHEN 650 MG RE SUPP: 650.0000 mg | Freq: Four times a day (QID) | RECTAL | Status: DC | PRN

## 2018-01-07 MED ORDER — ONDANSETRON HCL 4 MG PO TABS: 4.0000 mg | ORAL_TABLET | Freq: Four times a day (QID) | ORAL | Status: DC | PRN

## 2018-01-07 NOTE — H&P (Signed)
[facility] [place] NAME: Aaron Hendrix    MR#:  [id number]  DATE OF BIRTH:  03-Nov-1929  DATE OF ADMISSION:  01/06/2018  PRIMARY CARE PHYSICIAN: [name], MD   REQUESTING/REFERRING PHYSICIAN:   CHIEF COMPLAINT:   Chief Complaint  Patient presents with  . Altered Mental Status    HISTORY OF PRESENT ILLNESS: Aaron Hendrix  is a 84 y.o. male with a known history of COPD, diabetes type 2, hypertension, hyperlipidemia and other comorbidities. Patient is unable to provide history due to confusion.  Most of the information was taken from reviewing the medical records and from discussion with emergency room physician and the family.  Patient's wife and son are at the bedside. Per family, patient has been more confused and agitated in the past 24 hours.  No new medications no recent trauma/falls.  No fever/chills, no cough, no complaints of chest pain.  Patient has been complaining of occasional abdominal discomfort.  He was admitted for ileus, approximately 1 month ago. Blood test done emergency room, including CBC and CMP, are remarkable for hyponatremia at 129.  Chest x-ray and UA, reviewed by myself are negative for any acute changes.  Brain CT shows dementia changes, no acute intracranial pathology.  Abdominal CAT scan, reviewed by myself, shows small and large bowel air-fluid levels most compatible with enteritis. No bowel obstruction.  Patient is admitted for further evaluation and treatment.   PAST MEDICAL HISTORY:   Past Medical History:  Diagnosis Date  . Carpal tunnel syndrome   . COPD with asthma ([place])   . Diabetes mellitus    Diet control   . DJD (degenerative joint disease), cervical   . Duodenal ulcer, with partial obstruction 08/10/2012  . Essential and other specified forms of tremor 12/16/2013  . Hyperlipidemia   . Hypertension    no medicine needed  . IBS (irritable bowel syndrome)   . Internal hemorrhoids    . Memory deficit 12/16/2013  . Numbness and tingling in right hand    started 2 yeas ago  . Peptic ulcer   . Personal history of colonic polyps    adenomatous  . Polyneuropathy in diabetes(357.2)   . Tremor, essential   . Vitamin D deficiency     PAST SURGICAL HISTORY:  Past Surgical History:  Procedure Laterality Date  . ANAL FISSURE REPAIR    . ANKLE SURGERY Right    right- pins placed in  . COLONOSCOPY W/ BIOPSIES AND POLYPECTOMY  8/03, 6/05, 7/09, 9/10   internal hemorrhoids, tubular adenomas, mucosa & lymphoid nodules  . INGUINAL HERNIA REPAIR Right   . TOTAL HIP ARTHROPLASTY Right 11/13/2012   Procedure: TOTAL HIP ARTHROPLASTY ANTERIOR APPROACH;  Surgeon: Mcarthur Rossetti, MD;  Location: [facility];  Service: Orthopedics;  Laterality: Right;  Right total hip arthroplasty  . UPPER GASTROINTESTINAL ENDOSCOPY  3/05, 7/09, 9/10,2013   gastritis, duodenitis    SOCIAL HISTORY:  Social History   Tobacco Use  . Smoking status: Former Smoker    Types: Cigarettes    Last attempt to quit: 07/02/1963    Years since quitting: 54.5  . Smokeless tobacco: Never Used  Substance Use Topics  . Alcohol use: No    Alcohol/week: 0.0 oz    FAMILY HISTORY:  Family History  Problem Relation Age of Onset  . Thyroid cancer Daughter 56  . Diabetes Son   . Urolithiasis Son   . Leukemia Maternal Aunt   . Colon cancer  Neg Hx   . Esophageal cancer Neg Hx   . Rectal cancer Neg Hx   . Stomach cancer Neg Hx   . Prostate cancer Neg Hx   . Kidney disease Neg Hx   . Kidney cancer Neg Hx   . Bladder Cancer Neg Hx     DRUG ALLERGIES:  Allergies  Allergen Reactions  . Fexofenadine Other (See Comments)  . Quinapril Hcl Other (See Comments)    Unknown   . Latex Rash    REVIEW OF SYSTEMS:    Not able to obtain due to patient's confusion.   MEDICATIONS AT HOME:  Prior to Admission medications   Medication Sig Start Date End Date Taking? Authorizing Provider  Cholecalciferol  (VITAMIN D3) 50000 units CAPS Take 50,000 Units by mouth once a week.   Yes [provider]  finasteride (PROSCAR) 5 MG tablet Take 1 tablet (5 mg total) by mouth daily. 11/22/17  Yes [name], [name], PA-C  fluticasone (FLONASE) 50 MCG/ACT nasal spray Place 2 sprays into both nostrils daily. 09/12/17  Yes [name], MD  furosemide (LASIX) 40 MG tablet Take 40 mg by mouth daily.   Yes [provider]  levothyroxine (SYNTHROID, LEVOTHROID) 50 MCG tablet Take 1 tablet (50 mcg total) by mouth daily before breakfast. 12/02/16  Yes [name], MD  losartan (COZAAR) 50 MG tablet TAKE 1 TABLET BY MOUTH ONCE DAILY 04/26/17  Yes [name], MD  magnesium oxide (MAG-OX) 400 (241.3 Mg) MG tablet TAKE 1 TABLET BY MOUTH ONCE DAILY 01/02/18  Yes [name], MD  pantoprazole (PROTONIX) 40 MG tablet TAKE 1 TABLET BY MOUTH ONCE DAILY 09/11/17  Yes [name], MD  PROAIR HFA [phone] Base) MCG/ACT inhaler INHALE 1-2 PUFFS INTO THE LUNGS EVERY 6 HOURS AS NEEDED FOR WHEEZING OR SHORTNESS OF BREATH 09/11/17  Yes [name], MD  saccharomyces boulardii (FLORASTOR) 250 MG capsule Take 250 mg by mouth as needed.    Yes [provider]  tamsulosin (FLOMAX) 0.4 MG CAPS capsule Take 1 capsule (0.4 mg total) by mouth daily. 11/22/17  Yes [name], [name], PA-C  umeclidinium-vilanterol (ANORO ELLIPTA) 62.5-25 MCG/INH AEPB Inhale 1 puff into the lungs daily. 04/03/17  Yes [name], MD      PHYSICAL EXAMINATION:   VITAL SIGNS: Blood pressure (!) 155/75, pulse 70, temperature 97.6 F (36.4 C), temperature source Axillary, resp. rate 16, height 5\' 3"  (1.6 m), weight 57.9 kg (127 lb 9.6 oz), SpO2 100 %.  GENERAL:  84 y.o.-year-old patient lying in the bed with no acute distress.  He looks confused, lethargic. EYES: Pupils equal, round, reactive to light and accommodation. No scleral icterus.  HEENT: Head atraumatic, normocephalic. Oropharynx and nasopharynx clear.   NECK:  Supple, no jugular venous distention. No thyroid enlargement, no tenderness.  LUNGS: Normal breath sounds bilaterally, no wheezing, rales,rhonchi or crepitation. No use of accessory muscles of respiration.  CARDIOVASCULAR: S1, S2 normal. No S3/S4.  ABDOMEN: Soft, nontender, nondistended. Bowel sounds present. No organomegaly or mass.  EXTREMITIES: No pedal edema, cyanosis, or clubbing.  NEUROLOGIC: No focal weakness. PSYCHIATRIC: The patient is alert, but confused, disoriented.  SKIN: No obvious rash, lesion, or ulcer.   LABORATORY PANEL:   CBC Recent Labs  Lab 01/04/18 0454 01/06/18 2020  WBC 6.5 5.9  HGB 12.4* 11.6*  HCT 35.9* 34.3*  PLT 265 273  MCV 93.9 95.0  MCH 32.5 32.3  MCHC 34.6 34.0  RDW 13.4 13.4  LYMPHSABS 1.4 1.1  MONOABS 0.8 0.7  EOSABS 0.3 0.2  BASOSABS 0.0 0.0   ------------------------------------------------------------------------------------------------------------------  Chemistries  Recent Labs  Lab 01/03/18 1058 01/04/18 0454 01/06/18 2020  NA 130* 131* 129*  K  --  4.4 4.6  CL  --  96* 93*  CO2  --  30 34*  GLUCOSE  --  71 88  BUN  --  18 18  CREATININE  --  0.96 0.99  CALCIUM  --  8.9 8.8*  AST  --  21 21  ALT  --  11* 12*  ALKPHOS  --  53 48  BILITOT  --  0.5 0.4   ------------------------------------------------------------------------------------------------------------------ estimated creatinine clearance is 43.9 mL/min (by C-G formula based on SCr of 0.99 mg/dL). ------------------------------------------------------------------------------------------------------------------ No results for input(s): TSH, T4TOTAL, T3FREE, THYROIDAB in the last 72 hours.  Invalid input(s): FREET3   Coagulation profile No results for input(s): INR, PROTIME in the last 168 hours. ------------------------------------------------------------------------------------------------------------------- No results for input(s): DDIMER in the last  72 hours. -------------------------------------------------------------------------------------------------------------------  Cardiac Enzymes Recent Labs  Lab 01/04/18 0454 01/06/18 2020  TROPONINI <0.03 <0.03   ------------------------------------------------------------------------------------------------------------------ Invalid input(s): POCBNP  ---------------------------------------------------------------------------------------------------------------  Urinalysis    Component Value Date/Time   COLORURINE YELLOW (A) 01/06/2018 1910   APPEARANCEUR CLEAR (A) 01/06/2018 1910   APPEARANCEUR Clear 08/10/2015 1446   LABSPEC 1.006 01/06/2018 1910   LABSPEC 1.015 06/01/2014 0508   PHURINE 7.0 01/06/2018 1910   GLUCOSEU NEGATIVE 01/06/2018 1910   GLUCOSEU Negative 06/01/2014 0508   HGBUR NEGATIVE 01/06/2018 1910   BILIRUBINUR NEGATIVE 01/06/2018 1910   BILIRUBINUR Negative 08/10/2015 1446   BILIRUBINUR Negative 06/01/2014 0508   KETONESUR NEGATIVE 01/06/2018 1910   PROTEINUR NEGATIVE 01/06/2018 1910   UROBILINOGEN 1.0 11/06/2012 1035   NITRITE NEGATIVE 01/06/2018 1910   LEUKOCYTESUR NEGATIVE 01/06/2018 1910   LEUKOCYTESUR Negative 08/10/2015 1446   LEUKOCYTESUR Negative 06/01/2014 0508     RADIOLOGY: Ct Head Wo Contrast  Result Date: 01/06/2018 CLINICAL DATA:  Altered level of consciousness. Confusion. No reported injury. EXAM: CT HEAD WITHOUT CONTRAST TECHNIQUE: Contiguous axial images were obtained from the base of the skull through the vertex without intravenous contrast. COMPARISON:  01/04/2018 head CT. FINDINGS: Brain: No evidence of parenchymal hemorrhage or extra-axial fluid collection. No mass lesion, mass effect, or midline shift. No CT evidence of acute infarction. Generalized cerebral volume loss. Nonspecific mild to moderate subcortical and periventricular white matter hypodensity, most in keeping with chronic small vessel ischemic change. Cerebral ventricle  sizes are stable and concordant with the degree of cerebral volume loss. Vascular: No acute abnormality. Skull: No evidence of calvarial fracture. Sinuses/Orbits: The visualized paranasal sinuses are essentially clear. Other:  The mastoid air cells are unopacified. IMPRESSION: 1.  No evidence of acute intracranial abnormality. 2. Generalized cerebral volume loss and mild-to-moderate chronic small vessel ischemic changes in the cerebral white matter. Electronically Signed   By: Ilona Sorrel M.D.   On: 01/06/2018 19:56   Ct Abdomen Pelvis W Contrast  Result Date: 01/07/2018 CLINICAL DATA:  Confusion. History urinary tract infections. History of UTI, RIGHT inguinal hernia repair, anal fissure repair, colonoscopy with polypectomy. EXAM: CT ABDOMEN AND PELVIS WITH CONTRAST TECHNIQUE: Multidetector CT imaging of the abdomen and pelvis was performed using the standard protocol following bolus administration of intravenous contrast. CONTRAST:  124mL OMNIPAQUE IOHEXOL 300 MG/ML  SOLN COMPARISON:  CT abdomen and pelvis December 16, 2016 FINDINGS: LOWER CHEST: Dependent atelectasis. Included heart size is normal. Mild coronary artery calcifications. No pericardial effusion. HEPATOBILIARY: Liver is normal.  Tiny layering gallstones without CT findings of acute cholecystitis. PANCREAS: Normal. SPLEEN: Normal. ADRENALS/URINARY TRACT: Kidneys are orthotopic, demonstrating symmetric enhancement. No nephrolithiasis, hydronephrosis or solid renal masses. 18 mm cyst exophytic from upper pole RIGHT kidney. The unopacified ureters are normal in course and caliber. Delayed imaging through the kidneys demonstrates symmetric prompt contrast excretion within the proximal urinary collecting system. Urinary bladder is well distended with multiple suspected diverticula directed toward the RIGHT though limited by hardware artifact. Normal adrenal glands. STOMACH/BOWEL: The stomach, small and large bowel are normal in course and caliber without  inflammatory changes. Small and large bowel air-fluid levels. VASCULAR/LYMPHATIC: Aortoiliac vessels are normal in course and caliber. Moderate calcific atherosclerosis. No lymphadenopathy by CT size criteria. REPRODUCTIVE: Prostatomegaly invading base of bladder. OTHER: No intraperitoneal free fluid or free air. MUSCULOSKELETAL: Fatty atrophy rectus abdominus muscles, anterior abdominal wall ligamentous laxity. Streak artifact from RIGHT hip total arthroplasty. Small fat containing umbilical hernias. Moderate lumbar spondylosis. Osteopenia. IMPRESSION: 1. Small and large bowel air-fluid levels most compatible with enteritis. No bowel obstruction. 2. Probable bladder diverticulum, slight chronic bladder wall thickening seen with cystitis and bladder outlet obstruction. Aortic Atherosclerosis (ICD10-I70.0). Electronically Signed   By: Elon Alas M.D.   On: 01/07/2018 00:48   Dg Chest Portable 1 View  Result Date: 01/06/2018 CLINICAL DATA:  Confusion/altered mental status EXAM: PORTABLE CHEST 1 VIEW COMPARISON:  January 04, 2018 FINDINGS: There is slight elevation of the left hemidiaphragm. There is no edema or consolidation. There is a minimal right pleural effusion. Heart size and pulmonary vascularity are normal. No adenopathy. No pneumothorax. No appreciable bone lesions. IMPRESSION: Minimal right pleural effusion. No edema or consolidation. Stable cardiac silhouette. Electronically Signed   By: Lowella Grip III M.D.   On: 01/06/2018 19:29    EKG: Orders placed or performed during the hospital encounter of 01/06/18  . ED EKG  . ED EKG  . EKG 12-Lead  . EKG 12-Lead    IMPRESSION AND PLAN:  1.  Acute encephalopathy, likely multifactorial, related to hyponatremia and progressing dementia.  We will continue to monitor clinically closely while correcting hyponatremia. 2.  Acute hyponatremia, likely secondary to dehydration, due to poor p.o. Intake.  We will start gentle IV fluids and  continue to monitor sodium level closely.  3.  Advanced dementia, both Alzheimer's and vascular.  Continue supportive measures and monitor clinically closely.  Per patient's family, the goal is to avoid placement to nursing home.  Family would like to take care of him at home as long as possible. 4.  Acute enteritis, per abdominal CAT scan.  This is likely viral in nature.  No fever, nausea, vomiting or diarrhea.  We will continue supportive measures and monitor clinically closely. 5.  COPD with Asthma, stable, continue home maintenance medications.  All the records are reviewed and case discussed with ED provider. Management plans discussed with the patient, family and they are in agreement.  CODE STATUS:    Code Status Orders  (From admission, onward)        Start     Ordered   01/07/18 0144  Full code  Continuous     01/07/18 0143    Code Status History    Date Active Date Inactive Code Status Order ID Comments User Context   12/17/2017 0144 12/17/2017 2140 Full Code [id number]  Lance Coon, MD Inpatient   03/19/2017 1602 03/23/2017 1744 Full Code [id number]  Fritzi Mandes, MD Inpatient   03/08/2017 1559 03/11/2017 1504 Full Code  [id number]  Fritzi Mandes, MD Inpatient   11/25/2016 2044 11/27/2016 1455 Full Code [id number]  Demetrios Loll, MD Inpatient   07/16/2015 1508 07/22/2015 1744 Full Code [id number]  Hillary Bow, MD ED   06/25/2015 2014 07/06/2015 1757 Full Code [id number]  Gladstone Lighter, MD Inpatient   11/13/2012 1741 11/16/2012 1337 Full Code [id number]  Mcarthur Rossetti, MD Inpatient    Advance Directive Documentation     Most Recent Value  Type of Advance Directive  Living will  Pre-existing out of facility DNR order (yellow form or pink MOST form)  -  "MOST" Form in Place?  -       TOTAL TIME TAKING CARE OF THIS PATIENT: 40 minutes.    Amelia Jo M.D on 01/07/2018 at 4:05 AM  Between 7am to 6pm - Pager - [phone]  After 6pm go to [email or web address] - password [email or web address]  [facility]  Office  [phone]  CC: Primary care physician; [name], MD

## 2018-01-07 NOTE — Progress Notes (Signed)
[facility] [place] NAME: Aaron Hendrix    MR#:  [id number]  DATE OF BIRTH:  1929/12/10  SUBJECTIVE:   Laying in bed comfortably. No new issues for RN. REVIEW OF SYSTEMS:   Review of Systems  Unable to perform ROS: Dementia     DRUG ALLERGIES:   Allergies  Allergen Reactions  . Fexofenadine Other (See Comments)  . Quinapril Hcl Other (See Comments)    Unknown   . Latex Rash    VITALS:  Blood pressure 136/63, pulse 63, temperature 98 F (36.7 C), temperature source Oral, resp. rate 18, height 5\' 3"  (1.6 m), weight 57.9 kg (127 lb 9.6 oz), SpO2 98 %.  PHYSICAL EXAMINATION:   Physical Exam  GENERAL:  84 y.o.-year-old patient lying in the bed with no acute distress.  EYES: Pupils equal, round, reactive to light and accommodation. No scleral icterus. Extraocular muscles intact.  HEENT: Head atraumatic, normocephalic. Oropharynx and nasopharynx clear.  NECK:  Supple, no jugular venous distention. No thyroid enlargement, no tenderness.  LUNGS: Normal breath sounds bilaterally, no wheezing, rales, rhonchi. No use of accessory muscles of respiration.  CARDIOVASCULAR: S1, S2 normal. No murmurs, rubs, or gallops.  ABDOMEN: Soft, nontender, nondistended. Bowel sounds present. No organomegaly or mass.  EXTREMITIES: No cyanosis, clubbing or edema b/l.    NEUROLOGIC: moves all extremities well PSYCHIATRIC:  patient is alert and awake SKIN: No obvious rash, lesion, or ulcer.   LABORATORY PANEL:  CBC Recent Labs  Lab 01/07/18 0345  WBC 7.2  HGB 11.7*  HCT 35.2*  PLT 256    Chemistries  Recent Labs  Lab 01/06/18 2020 01/07/18 0345  NA 129* 131*  K 4.6 4.2  CL 93* 97*  CO2 34* 31  GLUCOSE 88 72  BUN 18 15  CREATININE 0.99 0.89  CALCIUM 8.8* 8.5*  AST 21  --   ALT 12*  --   ALKPHOS 48  --   BILITOT 0.4  --    Cardiac Enzymes Recent Labs  Lab 01/06/18 2020  TROPONINI <0.03   RADIOLOGY:  Ct Head Wo  Contrast  Result Date: 01/06/2018 CLINICAL DATA:  Altered level of consciousness. Confusion. No reported injury. EXAM: CT HEAD WITHOUT CONTRAST TECHNIQUE: Contiguous axial images were obtained from the base of the skull through the vertex without intravenous contrast. COMPARISON:  01/04/2018 head CT. FINDINGS: Brain: No evidence of parenchymal hemorrhage or extra-axial fluid collection. No mass lesion, mass effect, or midline shift. No CT evidence of acute infarction. Generalized cerebral volume loss. Nonspecific mild to moderate subcortical and periventricular white matter hypodensity, most in keeping with chronic small vessel ischemic change. Cerebral ventricle sizes are stable and concordant with the degree of cerebral volume loss. Vascular: No acute abnormality. Skull: No evidence of calvarial fracture. Sinuses/Orbits: The visualized paranasal sinuses are essentially clear. Other:  The mastoid air cells are unopacified. IMPRESSION: 1.  No evidence of acute intracranial abnormality. 2. Generalized cerebral volume loss and mild-to-moderate chronic small vessel ischemic changes in the cerebral white matter. Electronically Signed   By: Ilona Sorrel M.D.   On: 01/06/2018 19:56   Ct Abdomen Pelvis W Contrast  Result Date: 01/07/2018 CLINICAL DATA:  Confusion. History urinary tract infections. History of UTI, RIGHT inguinal hernia repair, anal fissure repair, colonoscopy with polypectomy. EXAM: CT ABDOMEN AND PELVIS WITH CONTRAST TECHNIQUE: Multidetector CT imaging of the abdomen and pelvis was performed using the standard protocol following bolus administration of intravenous contrast. CONTRAST:  130mL OMNIPAQUE IOHEXOL 300 MG/ML  SOLN COMPARISON:  CT abdomen and pelvis December 16, 2016 FINDINGS: LOWER CHEST: Dependent atelectasis. Included heart size is normal. Mild coronary artery calcifications. No pericardial effusion. HEPATOBILIARY: Liver is normal. Tiny layering gallstones without CT findings of acute  cholecystitis. PANCREAS: Normal. SPLEEN: Normal. ADRENALS/URINARY TRACT: Kidneys are orthotopic, demonstrating symmetric enhancement. No nephrolithiasis, hydronephrosis or solid renal masses. 18 mm cyst exophytic from upper pole RIGHT kidney. The unopacified ureters are normal in course and caliber. Delayed imaging through the kidneys demonstrates symmetric prompt contrast excretion within the proximal urinary collecting system. Urinary bladder is well distended with multiple suspected diverticula directed toward the RIGHT though limited by hardware artifact. Normal adrenal glands. STOMACH/BOWEL: The stomach, small and large bowel are normal in course and caliber without inflammatory changes. Small and large bowel air-fluid levels. VASCULAR/LYMPHATIC: Aortoiliac vessels are normal in course and caliber. Moderate calcific atherosclerosis. No lymphadenopathy by CT size criteria. REPRODUCTIVE: Prostatomegaly invading base of bladder. OTHER: No intraperitoneal free fluid or free air. MUSCULOSKELETAL: Fatty atrophy rectus abdominus muscles, anterior abdominal wall ligamentous laxity. Streak artifact from RIGHT hip total arthroplasty. Small fat containing umbilical hernias. Moderate lumbar spondylosis. Osteopenia. IMPRESSION: 1. Small and large bowel air-fluid levels most compatible with enteritis. No bowel obstruction. 2. Probable bladder diverticulum, slight chronic bladder wall thickening seen with cystitis and bladder outlet obstruction. Aortic Atherosclerosis (ICD10-I70.0). Electronically Signed   By: Elon Alas M.D.   On: 01/07/2018 00:48   Dg Chest Portable 1 View  Result Date: 01/06/2018 CLINICAL DATA:  Confusion/altered mental status EXAM: PORTABLE CHEST 1 VIEW COMPARISON:  January 04, 2018 FINDINGS: There is slight elevation of the left hemidiaphragm. There is no edema or consolidation. There is a minimal right pleural effusion. Heart size and pulmonary vascularity are normal. No adenopathy. No  pneumothorax. No appreciable bone lesions. IMPRESSION: Minimal right pleural effusion. No edema or consolidation. Stable cardiac silhouette. Electronically Signed   By: Lowella Grip III M.D.   On: 01/06/2018 19:29   ASSESSMENT AND PLAN:  [name]  is a 84 y.o. male with a known history of COPD, diabetes type 2, hypertension, hyperlipidemia and other comorbidities. Patient is unable to provide history due to confusion  1.  Acute encephalopathy, likely multifactorial, related to hyponatremia and progressing dementia.  We will continue to monitor clinically closely while correcting hyponatremia. -Continue supportive care with IV fluids. Sodium improving. -Came in with sodium of 129---131  2.  Acute hyponatremia, likely secondary to dehydration, due to poor p.o. Intake.  We will start gentle IV fluids and continue to monitor sodium level closely.   3.  Advanced dementia, both Alzheimer's and vascular.  Continue supportive measures and monitor clinically closely.  Per patient's family, the goal is to avoid placement to nursing home.  Family would like to take care of him at home as long as possible.  4.  Acute enteritis, per abdominal CAT scan.  This is likely viral in nature.  No fever, nausea, vomiting or diarrhea.  We will continue supportive measures and monitor clinically closely.  5.  COPD with Asthma, stable, continue home maintenance medications.  Continues to improve discharged home with home health. Physical therapy to see patient. Discussed with RN to get patient out of bed to chair. Encourage PO intake.    Case discussed with Care Management/Social Worker.   CODE STATUS: full  DVT Prophylaxis: Lovenox  TOTAL TIME TAKING CARE OF THIS PATIENT: 30 minutes.  >50% time spent on counselling and coordination of  care  POSSIBLE D/C IN 1-2 DAYS, DEPENDING ON CLINICAL CONDITION.  Note: This dictation was prepared with Dragon dictation along with smaller phrase technology.  Any transcriptional errors that result from this process are unintentional.  Fritzi Mandes M.D on 01/07/2018 at 3:15 PM  Between 7am to 6pm - Pager - [phone]  After 6pm go to [email or web address] - Proofreader  [facility]  Office  [phone]  CC: Primary care physician; [name], MDPatient ID: [name], male   DOB: 05/10/1930, 84 y.o.   MRN: [id number]

## 2018-01-07 NOTE — Progress Notes (Signed)
   01/07/18 1030  Clinical Encounter Type  Visited With Patient and family together  Visit Type Initial  Referral From Nurse  Consult/Referral To [place] responded to order for prayer.  Chaplain met with patient and spouse.  Spouse wanted to pray for healing for patient; patient wanted to pray for his spouse to be well.  She reported that she was several years in remission and that they had been married for 60 years this past March.  Chaplain prayed with patient and spouse, encouraged them to have chaplain paged as needed.

## 2018-01-07 NOTE — ED Notes (Signed)
Patient used urinal with family's help. Patient had also voided in a brief. Patient was changed into a new diaper. Patient tolerated procedure well.

## 2018-01-07 NOTE — Progress Notes (Signed)
Bipap initiated on Pt's home settings

## 2018-01-08 ENCOUNTER — Telehealth: Payer: Self-pay | Admitting: Internal Medicine

## 2018-01-08 DIAGNOSIS — G9340 Encephalopathy, unspecified: Secondary | ICD-10-CM | POA: Diagnosis not present

## 2018-01-08 LAB — GLUCOSE, CAPILLARY: GLUCOSE-CAPILLARY: 75 mg/dL (ref 65–99)

## 2018-01-08 LAB — SODIUM: Sodium: 130 mmol/L — ABNORMAL LOW (ref 135–145)

## 2018-01-08 MED ORDER — FUROSEMIDE 40 MG PO TABS: 20.0000 mg | ORAL_TABLET | Freq: Every day | ORAL | 0 refills | Status: DC | PRN

## 2018-01-08 MED ORDER — ENSURE ENLIVE PO LIQD: 237.0000 mL | Freq: Three times a day (TID) | ORAL | Status: DC

## 2018-01-08 NOTE — Telephone Encounter (Signed)
Left message to call back  

## 2018-01-08 NOTE — Telephone Encounter (Signed)
Pt scheduled for 01/19/18 @ 12pm for a HFU

## 2018-01-08 NOTE — Discharge Summary (Signed)
[facility] [place] NAME: Aaron Hendrix    MR#:  [id number]  DATE OF BIRTH:  12-Nov-1929  DATE OF ADMISSION:  01/06/2018 ADMITTING PHYSICIAN: Amelia Jo, MD  DATE OF DISCHARGE: 01/08/2018  PRIMARY CARE PHYSICIAN: [name], MD    ADMISSION DIAGNOSIS:  Altered mental status, unspecified altered mental status type [R41.82]  DISCHARGE DIAGNOSIS:  AMS--resolved Acute hyponatremia due to diuretics and dehydtration  SECONDARY DIAGNOSIS:   Past Medical History:  Diagnosis Date  . Carpal tunnel syndrome   . COPD with asthma ([place])   . Diabetes mellitus    Diet control   . DJD (degenerative joint disease), cervical   . Duodenal ulcer, with partial obstruction 08/10/2012  . Essential and other specified forms of tremor 12/16/2013  . Hyperlipidemia   . Hypertension    no medicine needed  . IBS (irritable bowel syndrome)   . Internal hemorrhoids   . Memory deficit 12/16/2013  . Numbness and tingling in right hand    started 2 yeas ago  . Peptic ulcer   . Personal history of colonic polyps    adenomatous  . Polyneuropathy in diabetes(357.2)   . Tremor, essential   . Vitamin D deficiency     HOSPITAL COURSE:  CharlesJeffriesis a84 y.o.malewith a known history of COPD, diabetes type 2, hypertension, hyperlipidemia and other comorbidities. Patient is unable to provide history due to confusion  1.Acute encephalopathy,likely multifactorial,related to hyponatremia and progressing dementia.We will continue to monitor clinically closely while correcting hyponatremia. -received supportive care with IV fluids. Sodium improving. -Came in with sodium of 129---131  2.Acute hyponatremia,likely secondary to dehydration,due to poor p.o. Intake and diuretics -recieved gentle IV fluids and continue to monitor sodium level closely.  -decreased lasix to 20 mg prn--son aware  3.Advanced dementia,both Alzheimer's  and vascular. -Continue supportive measures and monitor clinically closely.  4.Acute enteritis, per abdominal CAT scan.This is likely viral in nature. No fever,nausea,vomiting or diarrhea. We will continue supportive measures and monitor clinically closely.  5.COPD with Asthma,stable,continue home maintenance medications.  Continues to improve discharged home with home health. Dietitian consulted per family request.  Spoke with son in the room and wife on the phone.  D/c home    CONSULTS OBTAINED:    DRUG ALLERGIES:   Allergies  Allergen Reactions  . Fexofenadine Other (See Comments)  . Quinapril Hcl Other (See Comments)    Unknown   . Latex Rash    DISCHARGE MEDICATIONS:   Allergies as of 01/08/2018      Reactions   Fexofenadine Other (See Comments)   Quinapril Hcl Other (See Comments)   Unknown    Latex Rash      Medication List    TAKE these medications   finasteride 5 MG tablet Commonly known as:  PROSCAR Take 1 tablet (5 mg total) by mouth daily.   fluticasone 50 MCG/ACT nasal spray Commonly known as:  FLONASE Place 2 sprays into both nostrils daily.   furosemide 40 MG tablet Commonly known as:  LASIX Take 0.5 tablets (20 mg total) by mouth daily as needed for edema. What changed:    how much to take  when to take this  reasons to take this   levothyroxine 50 MCG tablet Commonly known as:  SYNTHROID, LEVOTHROID Take 1 tablet (50 mcg total) by mouth daily before breakfast.   losartan 50 MG tablet Commonly known as:  COZAAR TAKE 1 TABLET BY MOUTH ONCE DAILY   magnesium oxide  400 (241.3 Mg) MG tablet Commonly known as:  MAG-OX TAKE 1 TABLET BY MOUTH ONCE DAILY   pantoprazole 40 MG tablet Commonly known as:  PROTONIX TAKE 1 TABLET BY MOUTH ONCE DAILY   PROAIR HFA 108 (90 Base) MCG/ACT inhaler Generic drug:  albuterol INHALE 1-2 PUFFS INTO THE LUNGS EVERY 6 HOURS AS NEEDED FOR WHEEZING OR SHORTNESS OF BREATH    saccharomyces boulardii 250 MG capsule Commonly known as:  FLORASTOR Take 250 mg by mouth as needed.   tamsulosin 0.4 MG Caps capsule Commonly known as:  FLOMAX Take 1 capsule (0.4 mg total) by mouth daily.   umeclidinium-vilanterol 62.5-25 MCG/INH Aepb Commonly known as:  ANORO ELLIPTA Inhale 1 puff into the lungs daily.   Vitamin D3 50000 units Caps Take 50,000 Units by mouth once a week.       If you experience worsening of your admission symptoms, develop shortness of breath, life threatening emergency, suicidal or homicidal thoughts you must seek medical attention immediately by calling 911 or calling your MD immediately  if symptoms less severe.  You Must read complete instructions/literature along with all the possible adverse reactions/side effects for all the Medicines you take and that have been prescribed to you. Take any new Medicines after you have completely understood and accept all the possible adverse reactions/side effects.   Please note  You were cared for by a hospitalist during your hospital stay. If you have any questions about your discharge medications or the care you received while you were in the hospital after you are discharged, you can call the unit and asked to speak with the hospitalist on call if the hospitalist that took care of you is not available. Once you are discharged, your primary care physician will handle any further medical issues. Please note that NO REFILLS for any discharge medications will be authorized once you are discharged, as it is imperative that you return to your primary care physician (or establish a relationship with a primary care physician if you do not have one) for your aftercare needs so that they can reassess your need for medications and monitor your lab values. Today   SUBJECTIVE   Doing well Son in the room  VITAL SIGNS:  Blood pressure (!) 151/79, pulse 74, temperature (!) 97.4 F (36.3 C), temperature source Oral,  resp. rate 18, height 5\' 3"  (1.6 m), weight 57.9 kg (127 lb 9.6 oz), SpO2 96 %.  I/O:    Intake/Output Summary (Last 24 hours) at 01/08/2018 0800 Last data filed at 01/07/2018 1914 Gross per 24 hour  Intake 240 ml  Output 100 ml  Net 140 ml    PHYSICAL EXAMINATION:  GENERAL:  84 y.o.-year-old patient lying in the bed with no acute distress.  EYES: Pupils equal, round, reactive to light and accommodation. No scleral icterus. Extraocular muscles intact.  HEENT: Head atraumatic, normocephalic. Oropharynx and nasopharynx clear.  NECK:  Supple, no jugular venous distention. No thyroid enlargement, no tenderness.  LUNGS: Normal breath sounds bilaterally, no wheezing, rales,rhonchi or crepitation. No use of accessory muscles of respiration.  CARDIOVASCULAR: S1, S2 normal. No murmurs, rubs, or gallops.  ABDOMEN: Soft, non-tender, non-distended. Bowel sounds present. No organomegaly or mass.  EXTREMITIES: No pedal edema, cyanosis, or clubbing.  NEUROLOGIC: Cranial nerves II through XII are intact. Muscle strength 5/5 in all extremities. Sensation intact. Gait not checked.  PSYCHIATRIC: The patient is alert and oriented x 3.  SKIN: No obvious rash, lesion, or ulcer.   DATA REVIEW:  CBC  Recent Labs  Lab 01/07/18 0345  WBC 7.2  HGB 11.7*  HCT 35.2*  PLT 256    Chemistries  Recent Labs  Lab 01/06/18 2020 01/07/18 0345 01/08/18 0408  NA 129* 131* 130*  K 4.6 4.2  --   CL 93* 97*  --   CO2 34* 31  --   GLUCOSE 88 72  --   BUN 18 15  --   CREATININE 0.99 0.89  --   CALCIUM 8.8* 8.5*  --   AST 21  --   --   ALT 12*  --   --   ALKPHOS 48  --   --   BILITOT 0.4  --   --     Microbiology Results   No results found for this or any previous visit (from the past 240 hour(s)).  RADIOLOGY:  Ct Head Wo Contrast  Result Date: 01/06/2018 CLINICAL DATA:  Altered level of consciousness. Confusion. No reported injury. EXAM: CT HEAD WITHOUT CONTRAST TECHNIQUE: Contiguous axial images  were obtained from the base of the skull through the vertex without intravenous contrast. COMPARISON:  01/04/2018 head CT. FINDINGS: Brain: No evidence of parenchymal hemorrhage or extra-axial fluid collection. No mass lesion, mass effect, or midline shift. No CT evidence of acute infarction. Generalized cerebral volume loss. Nonspecific mild to moderate subcortical and periventricular white matter hypodensity, most in keeping with chronic small vessel ischemic change. Cerebral ventricle sizes are stable and concordant with the degree of cerebral volume loss. Vascular: No acute abnormality. Skull: No evidence of calvarial fracture. Sinuses/Orbits: The visualized paranasal sinuses are essentially clear. Other:  The mastoid air cells are unopacified. IMPRESSION: 1.  No evidence of acute intracranial abnormality. 2. Generalized cerebral volume loss and mild-to-moderate chronic small vessel ischemic changes in the cerebral white matter. Electronically Signed   By: Ilona Sorrel M.D.   On: 01/06/2018 19:56   Ct Abdomen Pelvis W Contrast  Result Date: 01/07/2018 CLINICAL DATA:  Confusion. History urinary tract infections. History of UTI, RIGHT inguinal hernia repair, anal fissure repair, colonoscopy with polypectomy. EXAM: CT ABDOMEN AND PELVIS WITH CONTRAST TECHNIQUE: Multidetector CT imaging of the abdomen and pelvis was performed using the standard protocol following bolus administration of intravenous contrast. CONTRAST:  117mL OMNIPAQUE IOHEXOL 300 MG/ML  SOLN COMPARISON:  CT abdomen and pelvis December 16, 2016 FINDINGS: LOWER CHEST: Dependent atelectasis. Included heart size is normal. Mild coronary artery calcifications. No pericardial effusion. HEPATOBILIARY: Liver is normal. Tiny layering gallstones without CT findings of acute cholecystitis. PANCREAS: Normal. SPLEEN: Normal. ADRENALS/URINARY TRACT: Kidneys are orthotopic, demonstrating symmetric enhancement. No nephrolithiasis, hydronephrosis or solid renal  masses. 18 mm cyst exophytic from upper pole RIGHT kidney. The unopacified ureters are normal in course and caliber. Delayed imaging through the kidneys demonstrates symmetric prompt contrast excretion within the proximal urinary collecting system. Urinary bladder is well distended with multiple suspected diverticula directed toward the RIGHT though limited by hardware artifact. Normal adrenal glands. STOMACH/BOWEL: The stomach, small and large bowel are normal in course and caliber without inflammatory changes. Small and large bowel air-fluid levels. VASCULAR/LYMPHATIC: Aortoiliac vessels are normal in course and caliber. Moderate calcific atherosclerosis. No lymphadenopathy by CT size criteria. REPRODUCTIVE: Prostatomegaly invading base of bladder. OTHER: No intraperitoneal free fluid or free air. MUSCULOSKELETAL: Fatty atrophy rectus abdominus muscles, anterior abdominal wall ligamentous laxity. Streak artifact from RIGHT hip total arthroplasty. Small fat containing umbilical hernias. Moderate lumbar spondylosis. Osteopenia. IMPRESSION: 1. Small and large bowel air-fluid levels most compatible with  enteritis. No bowel obstruction. 2. Probable bladder diverticulum, slight chronic bladder wall thickening seen with cystitis and bladder outlet obstruction. Aortic Atherosclerosis (ICD10-I70.0). Electronically Signed   By: Elon Alas M.D.   On: 01/07/2018 00:48   Dg Chest Portable 1 View  Result Date: 01/06/2018 CLINICAL DATA:  Confusion/altered mental status EXAM: PORTABLE CHEST 1 VIEW COMPARISON:  January 04, 2018 FINDINGS: There is slight elevation of the left hemidiaphragm. There is no edema or consolidation. There is a minimal right pleural effusion. Heart size and pulmonary vascularity are normal. No adenopathy. No pneumothorax. No appreciable bone lesions. IMPRESSION: Minimal right pleural effusion. No edema or consolidation. Stable cardiac silhouette. Electronically Signed   By: Lowella Grip III  M.D.   On: 01/06/2018 19:29     Management plans discussed with the patient, family and they are in agreement.  CODE STATUS:     Code Status Orders  (From admission, onward)        Start     Ordered   01/07/18 0144  Full code  Continuous     01/07/18 0143    Code Status History    Date Active Date Inactive Code Status Order ID Comments User Context   12/17/2017 0144 12/17/2017 2140 Full Code [id number]  Lance Coon, MD Inpatient   03/19/2017 1602 03/23/2017 1744 Full Code [id number]  Fritzi Mandes, MD Inpatient   03/08/2017 1559 03/11/2017 1504 Full Code [id number]  Fritzi Mandes, MD Inpatient   11/25/2016 2044 11/27/2016 1455 Full Code [id number]  Demetrios Loll, MD Inpatient   07/16/2015 1508 07/22/2015 1744 Full Code [id number]  Hillary Bow, MD ED   06/25/2015 2014 07/06/2015 1757 Full Code [id number]  Gladstone Lighter, MD Inpatient   11/13/2012 1741 11/16/2012 1337 Full Code [id number]  Mcarthur Rossetti, MD Inpatient    Advance Directive Documentation     Most Recent Value  Type of Advance Directive  Living will  Pre-existing out of facility DNR order (yellow form or pink MOST form)  -  "MOST" Form in Place?  -      TOTAL TIME TAKING CARE OF THIS PATIENT: 40 minutes.    Fritzi Mandes M.D on 01/08/2018 at 8:00 AM  Between 7am to 6pm - Pager - [phone] After 6pm go to [email or web address] - password [facility]  Office  [phone]  CC: Primary care physician; [name], MD

## 2018-01-08 NOTE — Telephone Encounter (Signed)
Copied from [place]. Topic: General - Other >> Jan 08, 2018  8:47 AM Yvette Rack wrote: Reason for CRM: Aaron Hendrix from [facility] [phone] calling to get a appt for pt for a hospital f/u with Nicki Reaper in a week she is completely full

## 2018-01-08 NOTE — Progress Notes (Signed)
Initial Nutrition Assessment  DOCUMENTATION CODES:   Severe malnutrition in context of chronic illness  INTERVENTION:  Recommend liberalizing to regular diet. Patient has discharge order so just discussed with patient and wife recommendation to follow a regular diet at home.  Provide Ensure Enlive po TID, each supplement provides 350 kcal and 20 grams of protein.  Encouraged adequate intake of calories and protein at meals.  NUTRITION DIAGNOSIS:   Severe Malnutrition related to chronic illness(COPD, IBS, advanced dementia, declining appetite) as evidenced by severe fat depletion, severe muscle depletion.  GOAL:   Patient will meet greater than or equal to 90% of their needs  MONITOR:   PO intake, Supplement acceptance, Labs, Weight trends, I & O's  REASON FOR ASSESSMENT:   Malnutrition Screening Tool, Consult Diet education  ASSESSMENT:   84 year old male with PMHx of advanced dementia, COPD, DM type 2, HLD, vitamin D deficiency, IBS, HTN who was admitted with acute encephalopathy likely related to hyponatremia and progressing dementia, acute enteritis.   Met with patient and his wife at bedside. Most of history provided by wife. She reports that patient has had a decreased appetite for a while now. She prepares him 3 meals per day but he normally only eats some breakfast and lunch. They had cut back on greasy/fried foods in setting of patient's IBS so he eats mainly grilled/baked chicken and fish. She has also been providing him Ensure occasionally and he enjoys the vanilla and strawberry flavors (at most one per day). Patient is tolerating diet well here and they report he will be discharging home today. Patient's wife asking about an appetite stimulant. Discussed that that has to be ordered by a doctor and encouraged them to ask the PCP about it at their follow-up visit coming up soon.  Patient reports that years ago he weighed 200 lbs. He lost weight intentionally many years  ago and was able to come off of his oral diabetic medications. Lately he has been losing weight unintentionally. He was 144.4 lbs on 06/07/2017 and has lost 16.8 lbs (11.6% body weight) over the past 7 months, which is significant for time frame.  Medications reviewed and include: Colace, levothyroxine, magnesium oxide 400 mg daily, pantoprazole, Florastor capsule 250 mg BID, Flomax, vitamin D 50000 units weekly.  Labs reviewed: CBG 65-75, Sodium 130, Chloride 97. Last HgbA1c was 6 on 12/12/2017.  Discussed with RN. Patient discharging today.  NUTRITION - FOCUSED PHYSICAL EXAM:    Most Recent Value  Orbital Region  Severe depletion  Upper Arm Region  Severe depletion  Thoracic and Lumbar Region  Severe depletion  Buccal Region  Severe depletion  Temple Region  Severe depletion  Clavicle Bone Region  Severe depletion  Clavicle and Acromion Bone Region  Severe depletion  Scapular Bone Region  Severe depletion  Dorsal Hand  Severe depletion  Patellar Region  Severe depletion  Anterior Thigh Region  Severe depletion  Posterior Calf Region  Severe depletion  Edema (RD Assessment)  -- [non-pitting edema to bilateral lower extremities]  Hair  Reviewed  Eyes  Reviewed  Mouth  Reviewed  Skin  Reviewed  Nails  Reviewed     Diet Order:  Diet Heart Room service appropriate? Yes; Fluid consistency: Thin Diet - low sodium heart healthy  EDUCATION NEEDS:   Education needs have been addressed  Skin:  Skin Assessment: Reviewed RN Assessment(ecchymosis to bilateral arms and hands)  Last BM:  01/08/2018 - small type 5  Height:   Ht Readings  from Last 1 Encounters:  01/07/18 _0  (1.6 m)    Weight:   Wt Readings from Last 1 Encounters:  01/07/18 127 lb 9.6 oz (57.9 kg)    Ideal Body Weight:  56.4 kg  BMI:  Body mass index is 22.6 kg/m.  Estimated Nutritional Needs:   Kcal:  1515-1750 (MSJ x 1.3-1.5)  Protein:  85-100 grams (1.5-1.7 grams/kg)  Fluid:  1.5 L/day (25  mL/kg)  Willey Blade, MS, RD, LDN Office: [phone] Pager: [phone] After Hours/Weekend Pager: [phone]

## 2018-01-08 NOTE — Progress Notes (Signed)
Pt being discharged home, discharge instructions reviewed with wife and pt, states understanding, pt with no complaints

## 2018-01-08 NOTE — Telephone Encounter (Unsigned)
Copied from [place] [id number]. Topic: Quick Communication - See Telephone Encounter >> Jan 08, 2018 10:43 AM Hewitt Shorts wrote: CRM for notification. See Telephone encounter for: 01/08/18. Pt daughter is needing a return call from [place] states that she left a meassage on 01/05/18 and no return call and now pt is in the er and would ike to talk about Dr Erling Cruz referral too  Best number [phone]

## 2018-01-08 NOTE — Care Management CC44 (Signed)
Condition Code 44 Documentation Completed  Patient Details  Name: Aaron Hendrix MRN: [id number] Date of Birth: [birth year]   Condition Code 44 given:  Yes Patient signature on Condition Code 44 notice:  Yes Documentation of 2 MD's agreement:  Yes Code 44 added to claim:  Yes    Jolly Mango, RN 01/08/2018, 11:32 AM

## 2018-01-08 NOTE — Care Management Obs Status (Signed)
[facility] NOTIFICATION   Patient Details  Name: Aaron Hendrix MRN: [id number] Date of Birth: August 23, 1930   Medicare Observation Status Notification Given:  Yes    Jolly Mango, RN 01/08/2018, 11:32 AM

## 2018-01-08 NOTE — Discharge Instructions (Signed)
Pt will f/u with his nephrologist and neurologist as out pt. Son to call a make appt  Nutrition [facility] Stay Proper nutrition can help your body recover from illness and injury.   Foods and beverages high in protein, vitamins, and minerals help rebuild muscle loss, promote healing, & reduce fall risk.   In addition to eating healthy foods, a nutrition shake is an easy, delicious way to get the nutrition you need during and after your hospital stay  It is recommended that you continue to drink 3 bottles per day of:       Ensure Enlive or Ensure Plus  Tips for adding a nutrition shake into your routine: As allowed, drink one with vitamins or medications instead of water or juice Enjoy one as a tasty mid-morning or afternoon snack Drink cold or make a milkshake out of it Drink one instead of milk with cereal or snacks Use as a coffee creamer   Available at the following grocery stores and pharmacies:           * [facility] [phone]            For COUPONS visit: www.ensure.com/join or [email or web address]   Suggested Substitutions Ensure Plus = Boost Plus = Carnation Breakfast Essentials = Boost Compact Ensure Active Clear = Boost Breeze Glucerna Shake = Boost Glucose Control = Carnation Breakfast Essentials SUGAR FREE

## 2018-01-09 ENCOUNTER — Encounter: Payer: Self-pay | Admitting: Pulmonary Disease

## 2018-01-09 ENCOUNTER — Ambulatory Visit (INDEPENDENT_AMBULATORY_CARE_PROVIDER_SITE_OTHER): Payer: Medicare Other | Admitting: Pulmonary Disease

## 2018-01-09 VITALS — BP 138/68 | HR 76 | Ht 63.0 in

## 2018-01-09 DIAGNOSIS — J449 Chronic obstructive pulmonary disease, unspecified: Secondary | ICD-10-CM

## 2018-01-09 DIAGNOSIS — R410 Disorientation, unspecified: Secondary | ICD-10-CM | POA: Diagnosis not present

## 2018-01-09 DIAGNOSIS — J9612 Chronic respiratory failure with hypercapnia: Secondary | ICD-10-CM

## 2018-01-09 DIAGNOSIS — J9611 Chronic respiratory failure with hypoxia: Secondary | ICD-10-CM

## 2018-01-09 DIAGNOSIS — G4733 Obstructive sleep apnea (adult) (pediatric): Secondary | ICD-10-CM | POA: Diagnosis not present

## 2018-01-09 DIAGNOSIS — G9340 Encephalopathy, unspecified: Secondary | ICD-10-CM | POA: Diagnosis not present

## 2018-01-09 DIAGNOSIS — Z9989 Dependence on other enabling machines and devices: Secondary | ICD-10-CM | POA: Diagnosis not present

## 2018-01-09 NOTE — Patient Instructions (Signed)
Continue Anoro inhaler and albuterol inhaler as needed Continue BiPAP with sleep Follow-up in 3-4 months

## 2018-01-09 NOTE — Telephone Encounter (Signed)
Transition Care Management Follow-up Telephone Call  How have you been since you were released from the hospital? Patient was doing well after leaving hospital, but had some new confusion again today. Saw pulmonary today and sees neurologist.   Do you understand why you were in the hospital? yes   Do you understand the discharge instrcutions? yes  Items Reviewed:  Medications reviewed: yes  Allergies reviewed: yes  Dietary changes reviewed: yes  Referrals reviewed: yes   Functional Questionnaire:   Activities of Daily Living (ADLs):   He states they are independent in the following: ambulation, bathing and hygiene, feeding, continence, grooming, toileting and dressing States they require assistance with the following: Just memory issues.   Any transportation issues/concerns?: no   Any patient concerns? Yes, would like to get home health for help with ADLS   Confirmed importance and date/time of follow-up visits scheduled: yes   Confirmed with patient if condition begins to worsen call PCP or go to the ER.  Patient was given the Call-a-Nurse line [phone]: yes

## 2018-01-09 NOTE — Progress Notes (Signed)
PROFILE: Hospitalized 9/29 - 07/06/15 initially with AMS and hyponatremia. Developed progressive hypersomnolence and was found to be profoundly hypercarbic (PaCO2 162 torr!!). Underwent intubation and mechanical ventilation 9/30 - 07/01/15. PCCM was involved in his care during his time in the ICU. It was felt that the hypercarbic resp failure was due to COPD and he was discharged home on bronchodilator therapy with pulmonary follow up. On his initial CXR, there was concern for a RUL nodule which was confirmed by CT chest. Again hospitalized 10/20-10/26/16 for weakness and hyponatremia.   PROBLEMS: COPD - mild obstruction at baseline Chronic hypercarbic respiratory failure Moderate OSA. Treated with CPAP   DATA: CT chest 07/20/15: RUL "nodule" smaller than previously noted in Sept PET 07/22/15: No specific findings identified to suggest hypermetabolic/FDG avid tumor. Continued resolution of right upper lobe pulmonary nodule which currently measures 1.3 cm and exhibits non malignant range FDG uptake Spirometry 07/30/15: FVC  2.09 73%,  FEV1 1.47 68%, FEV1% 70%   PSG 07/27/15: Moderate OSA (AHI 29) LE venous US 08/02/15: no DVT Hospitalized 03/02-03/04/18 for acute on chronic hypercarbic respiratory failure with hypoxemia. Discharge diagnosis was COPD exacerbation. CPAP compliance 05/08-06/06/18: Usage 29/30 days, > 4 hours 8 days, < 4 hours 21 days Sleep study 04/14/17: Bilevel trial - recommended 10/5 with backup rate of 8 and 2 LPM O2 bleed in  INTERVAL HISTORY: Hospitalized 04/13 - 04/15 with AMS and mild hyponatremia (130)  SUBJ: This is a previously scheduled routine re-eval.  However, he was hospitalized recently for altered mental status and very mild hyponatremia (which is chronic).  He remains intermittently confused.  From a respiratory point of view, he has had no problems.  He is wearing BiPAP at night compliantly.  His family reports no exertional dyspnea and no other respiratory  symptoms.  He is notably poorly oriented during this encounter and occasionally uncooperative.  OBJ: Vitals:   01/09/18 1047 01/09/18 1048  BP:  138/68  Pulse:  76  SpO2:  98%  Height: 5\' 3"  (1.6 m)   RA   NAD HEENT WNL No JVD BS mildly diminished without wheezes or other adventitious sounds Reg, no M noted Abd soft, NT, +BS Ext + symmetric BLE pitting edema Neuro: diffusely weak, no focal deficits  Outpatient Encounter Medications as of 01/09/2018  Medication Sig  . Cholecalciferol (VITAMIN D3) 50000 units CAPS Take 50,000 Units by mouth once a week.  . finasteride (PROSCAR) 5 MG tablet Take 1 tablet (5 mg total) by mouth daily.  . fluticasone (FLONASE) 50 MCG/ACT nasal spray Place 2 sprays into both nostrils daily.  . furosemide (LASIX) 40 MG tablet Take 0.5 tablets (20 mg total) by mouth daily as needed for edema.  Marland Kitchen levothyroxine (SYNTHROID, LEVOTHROID) 50 MCG tablet Take 1 tablet (50 mcg total) by mouth daily before breakfast.  . losartan (COZAAR) 50 MG tablet TAKE 1 TABLET BY MOUTH ONCE DAILY  . magnesium oxide (MAG-OX) 400 (241.3 Mg) MG tablet TAKE 1 TABLET BY MOUTH ONCE DAILY  . pantoprazole (PROTONIX) 40 MG tablet TAKE 1 TABLET BY MOUTH ONCE DAILY  . PROAIR HFA 108 (90 Base) MCG/ACT inhaler INHALE 1-2 PUFFS INTO THE LUNGS EVERY 6 HOURS AS NEEDED FOR WHEEZING OR SHORTNESS OF BREATH  . saccharomyces boulardii (FLORASTOR) 250 MG capsule Take 250 mg by mouth as needed.   . tamsulosin (FLOMAX) 0.4 MG CAPS capsule Take 1 capsule (0.4 mg total) by mouth daily.  Marland Kitchen umeclidinium-vilanterol (ANORO ELLIPTA) 62.5-25 MCG/INH AEPB Inhale 1 puff into the lungs  daily.   No facility-administered encounter medications on file as of 01/09/2018.    DATA: BMP Latest Ref Rng & Units 01/08/2018 01/07/2018 01/06/2018  Glucose 65 - 99 mg/dL - 72 88  BUN 6 - 20 mg/dL - 15 18  Creatinine 0.61 - 1.24 mg/dL - 0.89 0.99  Sodium 135 - 145 mmol/L 130(L) 131(L) 129(L)  Potassium 3.5 - 5.1 mmol/L -  4.2 4.6  Chloride 101 - 111 mmol/L - 97(L) 93(L)  CO2 22 - 32 mmol/L - 31 34(H)  Calcium 8.9 - 10.3 mg/dL - 8.5(L) 8.8(L)    CBC Latest Ref Rng & Units 01/07/2018 01/06/2018 01/04/2018  WBC 3.8 - 10.6 K/uL 7.2 5.9 6.5  Hemoglobin 13.0 - 18.0 g/dL 11.7(L) 11.6(L) 12.4(L)  Hematocrit 40.0 - 52.0 % 35.2(L) 34.3(L) 35.9(L)  Platelets 150 - 440 K/uL 256 273 265   CXR 01/06/18: No acute findings ABG 01/06/18: 7.46/43/108  IMPRESSION: COPD, mild - respiratory status is at baseline Chronic hypoxemic respiratory failure Chronic alveolar hypoventilation, much improved Moderate OSA Acute AMS/delirium -he appears to be experiencing progression of some form of dementia.  Neurology evaluation is scheduled for 01/10/18.  His current neurologic symptoms are not related to his pulmonary problems.  It is unlikely that very mild hyponatremia (which is chronic) is a major contributor as well.    PLAN: Continue Anoro inhaler - one inhalation daily  continue albuterol (Proventil) inhaler as needed (first line rescue medication) continue DuoNeb every 6 hours as needed (second line rescue medication) Continue nocturnal BiPAP - he has benefited significantly from this therapy. Current settings 10/5  Follow up in 3-4 months   Merton Border, MD PCCM service Mobile [phone] Pager [phone] 01/14/2018 5:45 PM

## 2018-01-10 ENCOUNTER — Encounter: Payer: Self-pay | Admitting: Neurology

## 2018-01-10 ENCOUNTER — Ambulatory Visit (INDEPENDENT_AMBULATORY_CARE_PROVIDER_SITE_OTHER): Payer: Medicare Other | Admitting: Neurology

## 2018-01-10 VITALS — BP 150/74 | HR 85 | Ht 63.0 in | Wt 130.0 lb

## 2018-01-10 DIAGNOSIS — R413 Other amnesia: Secondary | ICD-10-CM

## 2018-01-10 MED ORDER — MIRTAZAPINE 7.5 MG PO TABS: 7.5000 mg | ORAL_TABLET | Freq: Every day | ORAL | 1 refills | Status: DC

## 2018-01-10 NOTE — Progress Notes (Signed)
Reason for visit: Memory disturbance, dementia  Aaron Hendrix is an 84 y.o. male  History of present illness:  Aaron Hendrix is an 84 year old right-handed black male with a history of a progressive memory disorder.  The patient also has significant COPD, he is on CPAP at night.  He has been losing weight, he has lost 15 pounds since last seen in September 2018.  The patient has some issues with abdominal discomfort, with diarrhea at times.  The patient sleeps well at night.  He has a lot of OCD tendencies, everything has to be exactly right for him.  He questions everything, at times he has delusional thinking, he is concerned about poison in his juice.  The patient has been in the emergency room on 3 occasions with one admission within the last several weeks.  He went into the hospital on 22 December 2017 with abdominal pain, he returned on 11 April with confusion and he was admitted to the hospital on 06 January 2018 with confusion.  He was found to have a sodium level in the 129-131 range.  CT of the abdomen showed enteritis that was felt to be viral in nature.  The PCO2 was unremarkable at 43.  The patient in the past has not wanted to go on any medications for memory.  The patient has a lot of underlying anxiety and agitation.  He comes back to this office for an evaluation.  A CT scan of the brain was done recently and did not show any acute changes in the brain.  Past Medical History:  Diagnosis Date  . Carpal tunnel syndrome   . Diabetes mellitus    Diet control   . DJD (degenerative joint disease), cervical   . Duodenal ulcer, with partial obstruction 08/10/2012  . Essential and other specified forms of tremor 12/16/2013  . Hyperlipidemia   . Hypertension    no medicine needed  . IBS (irritable bowel syndrome)   . Internal hemorrhoids   . Memory deficit 12/16/2013  . Numbness and tingling in right hand    started 2 yeas ago  . Peptic ulcer   . Personal history of colonic polyps     adenomatous  . Polyneuropathy in diabetes(357.2)   . Tremor, essential   . Vitamin D deficiency     Past Surgical History:  Procedure Laterality Date  . ANAL FISSURE REPAIR    . ANKLE SURGERY Right    right- pins placed in  . COLONOSCOPY W/ BIOPSIES AND POLYPECTOMY  8/03, 6/05, 7/09, 9/10   internal hemorrhoids, tubular adenomas, mucosa & lymphoid nodules  . INGUINAL HERNIA REPAIR Right   . TOTAL HIP ARTHROPLASTY Right 11/13/2012   Procedure: TOTAL HIP ARTHROPLASTY ANTERIOR APPROACH;  Surgeon: Mcarthur Rossetti, MD;  Location: [facility];  Service: Orthopedics;  Laterality: Right;  Right total hip arthroplasty  . UPPER GASTROINTESTINAL ENDOSCOPY  3/05, 7/09, 9/10,2013   gastritis, duodenitis    Family History  Problem Relation Age of Onset  . Thyroid cancer Daughter 32  . Diabetes Son   . Urolithiasis Son   . Leukemia Maternal Aunt   . Colon cancer Neg Hx   . Esophageal cancer Neg Hx   . Rectal cancer Neg Hx   . Stomach cancer Neg Hx   . Prostate cancer Neg Hx   . Kidney disease Neg Hx   . Kidney cancer Neg Hx   . Bladder Cancer Neg Hx     Social history:  reports that  he quit smoking about 54 years ago. His smoking use included cigarettes. He has never used smokeless tobacco. He reports that he does not drink alcohol or use drugs.    Allergies  Allergen Reactions  . Fexofenadine Other (See Comments)  . Quinapril Hcl Other (See Comments)    Unknown   . Latex Rash    Medications:  Prior to Admission medications   Medication Sig Start Date End Date Taking? Authorizing Provider  Cholecalciferol (VITAMIN D3) 50000 units CAPS Take 50,000 Units by mouth once a week.   Yes [provider]  finasteride (PROSCAR) 5 MG tablet Take 1 tablet (5 mg total) by mouth daily. 11/22/17  Yes [name], [name], PA-C  fluticasone (FLONASE) 50 MCG/ACT nasal spray Place 2 sprays into both nostrils daily. 09/12/17  Yes [name], MD  furosemide (LASIX) 40 MG tablet Take  0.5 tablets (20 mg total) by mouth daily as needed for edema. 01/08/18  Yes Fritzi Mandes, MD  levothyroxine (SYNTHROID, LEVOTHROID) 50 MCG tablet Take 1 tablet (50 mcg total) by mouth daily before breakfast. 12/02/16  Yes [name], MD  losartan (COZAAR) 50 MG tablet TAKE 1 TABLET BY MOUTH ONCE DAILY 04/26/17  Yes [name], MD  magnesium oxide (MAG-OX) 400 (241.3 Mg) MG tablet TAKE 1 TABLET BY MOUTH ONCE DAILY 01/02/18  Yes [name], MD  pantoprazole (PROTONIX) 40 MG tablet TAKE 1 TABLET BY MOUTH ONCE DAILY 09/11/17  Yes [name], MD  PROAIR HFA 108 [phone] Base) MCG/ACT inhaler INHALE 1-2 PUFFS INTO THE LUNGS EVERY 6 HOURS AS NEEDED FOR WHEEZING OR SHORTNESS OF BREATH 09/11/17  Yes [name], MD  saccharomyces boulardii (FLORASTOR) 250 MG capsule Take 250 mg by mouth as needed.    Yes [provider]  tamsulosin (FLOMAX) 0.4 MG CAPS capsule Take 1 capsule (0.4 mg total) by mouth daily. 11/22/17  Yes [name], [name], PA-C  umeclidinium-vilanterol (ANORO ELLIPTA) 62.5-25 MCG/INH AEPB Inhale 1 puff into the lungs daily. 04/03/17  Yes [name], MD    ROS:  Out of a complete 14 system review of symptoms, the patient complains only of the following symptoms, and all other reviewed systems are negative.  Decreased appetite, weight loss Frequency of urination Memory loss Confusion  Blood pressure (!) 150/74, pulse 85, height 5\' 3"  (1.6 m), weight 130 lb (59 kg).  Physical Exam  General: The patient is alert and cooperative at the time of the examination.  Skin: No significant peripheral edema is noted.   Neurologic Exam  Mental status: The patient is alert and oriented x 1 at the time of the examination (not oriented to place or date). The Mini-Mental status examination done today shows a total score 13/30.   Cranial nerves: Facial symmetry is present. Speech is normal, no aphasia or dysarthria is noted. Extraocular movements are full. Visual fields  are full.  Motor: The patient has good strength in all 4 extremities.  Sensory examination: Soft touch sensation is symmetric on the face, arms, and legs.  Coordination: The patient has good finger-nose-finger and heel-to-shin bilaterally.  Gait and station: The patient has a slightly wide-based gait, the patient walks with a cane.  Tandem gait was not attempted.  Romberg is negative.  Reflexes: Deep tendon reflexes are symmetric.   CT head 01/06/18:  IMPRESSION: 1.  No evidence of acute intracranial abnormality. 2. Generalized cerebral volume loss and mild-to-moderate chronic small vessel ischemic changes in the cerebral white matter.  * CT scan images  were reviewed online. I agree with the written report.   Assessment/Plan:  1.  Progressive memory disturbance, dementia  2.  Recent altered mental status  The patient is continuing to progress with his memory.  They have not wanted to go on medications for memory but may be amenable to it now.  The patient is not a good candidate for Aricept given the fact that he is losing weight.  The patient was placed on mirtazapine 7.5 mg at night, the family is to contact me in a couple weeks for any medication adjustment.  In the future, we may consider addition of Namenda.  He will otherwise follow-up in 6 months.  Jill Alexanders MD 01/10/2018 7:35 AM  [facility] [address] [place], [place] [address]  Phone [phone] Fax [phone]

## 2018-01-10 NOTE — Patient Instructions (Signed)
   We will start mirtazepine 7.5 mg at night.

## 2018-01-11 ENCOUNTER — Telehealth: Payer: Self-pay

## 2018-01-11 NOTE — Telephone Encounter (Signed)
EMMI Follow-up: Talked with Aaron Hendrix as patient was resting.  She was aware that Lasix was reduced to 20 mg. and she had the discharge paperwork showing the follow-up appointments. No concerns at this time.

## 2018-01-14 ENCOUNTER — Encounter: Payer: Self-pay | Admitting: Pulmonary Disease

## 2018-01-16 ENCOUNTER — Emergency Department: Payer: Medicare Other

## 2018-01-16 ENCOUNTER — Emergency Department
Admission: EM | Admit: 2018-01-16 | Discharge: 2018-01-16 | Disposition: A | Discharge status: Home / Self Care | Payer: Medicare Other | Attending: Emergency Medicine | Admitting: Emergency Medicine

## 2018-01-16 ENCOUNTER — Encounter: Payer: Self-pay | Admitting: Emergency Medicine

## 2018-01-16 DIAGNOSIS — R0602 Shortness of breath: Secondary | ICD-10-CM | POA: Diagnosis present

## 2018-01-16 DIAGNOSIS — G309 Alzheimer's disease, unspecified: Secondary | ICD-10-CM | POA: Diagnosis not present

## 2018-01-16 DIAGNOSIS — R0982 Postnasal drip: Secondary | ICD-10-CM | POA: Insufficient documentation

## 2018-01-16 DIAGNOSIS — Z87891 Personal history of nicotine dependence: Secondary | ICD-10-CM | POA: Insufficient documentation

## 2018-01-16 DIAGNOSIS — Z96641 Presence of right artificial hip joint: Secondary | ICD-10-CM | POA: Insufficient documentation

## 2018-01-16 DIAGNOSIS — F0280 Dementia in other diseases classified elsewhere without behavioral disturbance: Secondary | ICD-10-CM | POA: Insufficient documentation

## 2018-01-16 DIAGNOSIS — R059 Cough, unspecified: Secondary | ICD-10-CM

## 2018-01-16 DIAGNOSIS — E871 Hypo-osmolality and hyponatremia: Secondary | ICD-10-CM | POA: Diagnosis not present

## 2018-01-16 DIAGNOSIS — I10 Essential (primary) hypertension: Secondary | ICD-10-CM | POA: Insufficient documentation

## 2018-01-16 DIAGNOSIS — R05 Cough: Secondary | ICD-10-CM | POA: Diagnosis not present

## 2018-01-16 DIAGNOSIS — Z91040 Latex allergy status: Secondary | ICD-10-CM | POA: Insufficient documentation

## 2018-01-16 DIAGNOSIS — N289 Disorder of kidney and ureter, unspecified: Secondary | ICD-10-CM | POA: Diagnosis not present

## 2018-01-16 DIAGNOSIS — E039 Hypothyroidism, unspecified: Secondary | ICD-10-CM | POA: Insufficient documentation

## 2018-01-16 DIAGNOSIS — E119 Type 2 diabetes mellitus without complications: Secondary | ICD-10-CM | POA: Diagnosis not present

## 2018-01-16 DIAGNOSIS — E860 Dehydration: Secondary | ICD-10-CM | POA: Diagnosis not present

## 2018-01-16 DIAGNOSIS — J449 Chronic obstructive pulmonary disease, unspecified: Secondary | ICD-10-CM | POA: Insufficient documentation

## 2018-01-16 LAB — URINALYSIS, COMPLETE (UACMP) WITH MICROSCOPIC
BILIRUBIN URINE: NEGATIVE
GLUCOSE, UA: NEGATIVE mg/dL
HGB URINE DIPSTICK: NEGATIVE
KETONES UR: NEGATIVE mg/dL
LEUKOCYTES UA: NEGATIVE
NITRITE: NEGATIVE
PH: 7 (ref 5.0–8.0)
PROTEIN: NEGATIVE mg/dL
Specific Gravity, Urine: 1.006 (ref 1.005–1.030)

## 2018-01-16 LAB — CBC
HEMATOCRIT: 33.5 % — AB (ref 40.0–52.0)
HEMOGLOBIN: 11.2 g/dL — AB (ref 13.0–18.0)
MCH: 31.8 pg (ref 26.0–34.0)
MCHC: 33.3 g/dL (ref 32.0–36.0)
MCV: 95.3 fL (ref 80.0–100.0)
Platelets: 254 10*3/uL (ref 150–440)
RBC: 3.52 MIL/uL — AB (ref 4.40–5.90)
RDW: 13.6 % (ref 11.5–14.5)
WBC: 7.4 10*3/uL (ref 3.8–10.6)

## 2018-01-16 LAB — BRAIN NATRIURETIC PEPTIDE: B NATRIURETIC PEPTIDE 5: 99 pg/mL (ref 0.0–100.0)

## 2018-01-16 LAB — BASIC METABOLIC PANEL
ANION GAP: 6 (ref 5–15)
Anion gap: 4 — ABNORMAL LOW (ref 5–15)
BUN: 17 mg/dL (ref 6–20)
BUN: 17 mg/dL (ref 6–20)
CALCIUM: 8.3 mg/dL — AB (ref 8.9–10.3)
CHLORIDE: 89 mmol/L — AB (ref 101–111)
CO2: 33 mmol/L — AB (ref 22–32)
CO2: 34 mmol/L — AB (ref 22–32)
Calcium: 8.6 mg/dL — ABNORMAL LOW (ref 8.9–10.3)
Chloride: 94 mmol/L — ABNORMAL LOW (ref 101–111)
Creatinine, Ser: 1.27 mg/dL — ABNORMAL HIGH (ref 0.61–1.24)
Creatinine, Ser: 1.04 mg/dL (ref 0.61–1.24)
GFR calc Af Amer: >60 mL/min (ref >60)
GFR calc non Af Amer: 50 mL/min — ABNORMAL LOW (ref >60)
GFR, EST AFRICAN AMERICAN: 58 mL/min — AB (ref >60)
GLUCOSE: 95 mg/dL (ref 65–99)
Glucose, Bld: 109 mg/dL — ABNORMAL HIGH (ref 65–99)
POTASSIUM: 4.2 mmol/L (ref 3.5–5.1)
Potassium: 4.6 mmol/L (ref 3.5–5.1)
Sodium: 128 mmol/L — ABNORMAL LOW (ref 135–145)
Sodium: 132 mmol/L — ABNORMAL LOW (ref 135–145)

## 2018-01-16 LAB — TROPONIN I

## 2018-01-16 MED ORDER — SODIUM CHLORIDE 0.9 % IV BOLUS
500.0000 mL | Freq: Once | INTRAVENOUS | Status: AC
  Administered 2018-01-16: 500 mL via INTRAVENOUS

## 2018-01-16 MED ORDER — CETIRIZINE HCL 10 MG PO TABS: 10.0000 mg | ORAL_TABLET | Freq: Every day | ORAL | 0 refills | Status: AC

## 2018-01-16 MED ORDER — FLUTICASONE PROPIONATE 50 MCG/ACT NA SUSP: 2.0000 | Freq: Every day | NASAL | 0 refills | Status: AC

## 2018-01-16 MED ORDER — SODIUM CHLORIDE 0.9 % IV BOLUS
1000.0000 mL | Freq: Once | INTRAVENOUS | Status: AC
  Administered 2018-01-16: 1000 mL via INTRAVENOUS

## 2018-01-16 NOTE — ED Notes (Signed)
Ambulated with patient.  Oxygen level remained above 95%.

## 2018-01-16 NOTE — Discharge Instructions (Addendum)
Make sure that you drink plenty of fluid to prevent low sodium and abnormal kidney function.  Please return to the emergency department for shortness of breath, lightheadedness or fainting, chest pain, fever, or any other symptoms concerning to you.

## 2018-01-16 NOTE — ED Provider Notes (Addendum)
[facility] Emergency Department Provider Note  ____________________________________________  Time seen: Approximately 3:30 PM  I have reviewed the triage vital signs and the nursing notes.   HISTORY  Chief Complaint Shortness of Breath    HPI Aaron Hendrix is a 84 y.o. male with a history of COPD not on O2, seasonal allergies, Alzheimer's dementia, HTN, HL, presenting with postnasal drip, mild sore throat, mild increase in shortness of breath for the past several days.  The patient reports that he has had associated minimal nonproductive cough.  He has not had any fever or chills, chest pain, tightness or pressure, no change in his lower extremity edema.  No calf pain.  No nausea vomiting or diarrhea, abdominal pain.  He does not report any confusion although there is no family here.  Past Medical History:  Diagnosis Date  . Carpal tunnel syndrome   . Diabetes mellitus    Diet control   . DJD (degenerative joint disease), cervical   . Duodenal ulcer, with partial obstruction 08/10/2012  . Essential and other specified forms of tremor 12/16/2013  . Hyperlipidemia   . Hypertension    no medicine needed  . IBS (irritable bowel syndrome)   . Internal hemorrhoids   . Memory deficit 12/16/2013  . Numbness and tingling in right hand    started 2 yeas ago  . Peptic ulcer   . Personal history of colonic polyps    adenomatous  . Polyneuropathy in diabetes(357.2)   . Tremor, essential   . Vitamin D deficiency     Patient Active Problem List   Diagnosis Date Noted  . Acute encephalopathy 01/07/2018  . Anemia 12/12/2017  . Healthcare maintenance 09/14/2017  . Respiratory failure with hypoxia and hypercapnia ([place]) 03/19/2017  . Palliative care by specialist 03/09/2017  . Hypercapnia   . Acute on chronic respiratory failure with hypercapnia ([place]) 03/08/2017  . Hypoxia 11/25/2016  . COPD exacerbation ([place]) 11/25/2016  . Rash 01/18/2016  . Nocturia  08/11/2015  . BPH with obstruction/lower urinary tract symptoms 08/11/2015  . Hematemesis 07/29/2015  . Ileus ([place]) 07/29/2015  . Lower extremity edema 07/16/2015  . Increased urinary frequency 07/16/2015  . COPD, mild ([place]) 07/16/2015  . Hyponatremia 06/25/2015  . Skin lesion 02/16/2015  . DNR (do not resuscitate) discussion 02/16/2015  . Hypothyroidism 09/27/2014  . Chronic constipation 09/01/2014  . Trigger thumb 06/22/2014  . Abdominal pain 06/22/2014  . Personal history of other specified conditions 02/20/2014  . Decreased anal sphincter tone 02/19/2014  . Other specified symptoms and signs involving the digestive system and abdomen 02/19/2014  . Diabetes mellitus ([place]) 02/19/2014  . Memory deficit 12/16/2013  . Essential and other specified forms of tremor 12/16/2013  . Amnesia 12/16/2013  . Loss of weight 09/10/2013  . Periumbilical abdominal pain 09/10/2013  . Dermatophytosis of foot 07/01/2013  . Degenerative arthritis of hip 11/13/2012  . DU (duodenal ulcer) 08/22/2012  . Nausea 08/10/2012  . Hypertension 05/25/2011  . Diabetes mellitus, type II ([place]) 05/25/2011  . History of colonic polyps 03/31/2008  . Adaptive colitis 03/31/2008    Past Surgical History:  Procedure Laterality Date  . ANAL FISSURE REPAIR    . ANKLE SURGERY Right    right- pins placed in  . COLONOSCOPY W/ BIOPSIES AND POLYPECTOMY  8/03, 6/05, 7/09, 9/10   internal hemorrhoids, tubular adenomas, mucosa & lymphoid nodules  . INGUINAL HERNIA REPAIR Right   . TOTAL HIP ARTHROPLASTY Right 11/13/2012   Procedure: TOTAL HIP ARTHROPLASTY  ANTERIOR APPROACH;  Surgeon: Mcarthur Rossetti, MD;  Location: [facility];  Service: Orthopedics;  Laterality: Right;  Right total hip arthroplasty  . UPPER GASTROINTESTINAL ENDOSCOPY  3/05, 7/09, 9/10,2013   gastritis, duodenitis    Current Outpatient Rx  . Order #: [id number] Class: Historical Med  . Order #: [id number] Class: Normal  . Order #: [id number] Class:  Normal  . Order #: [id number] Class: Normal  . Order #: [id number] Class: Normal  . Order #: [id number] Class: Normal  . Order #: [id number] Class: Normal  . Order #: [id number] Class: Normal  . Order #: [id number] Class: Normal  . Order #: [id number] Class: Historical Med  . Order #: [id number] Class: Normal  . Order #: [id number] Class: Sample  . Order #: [id number] Class: Print  . Order #: [id number] Class: Print    Allergies Fexofenadine; Quinapril hcl; and Latex  Family History  Problem Relation Age of Onset  . Thyroid cancer Daughter 66  . Diabetes Son   . Urolithiasis Son   . Leukemia Maternal Aunt   . Colon cancer Neg Hx   . Esophageal cancer Neg Hx   . Rectal cancer Neg Hx   . Stomach cancer Neg Hx   . Prostate cancer Neg Hx   . Kidney disease Neg Hx   . Kidney cancer Neg Hx   . Bladder Cancer Neg Hx     Social History Social History   Tobacco Use  . Smoking status: Former Smoker    Types: Cigarettes    Last attempt to quit: 07/02/1963    Years since quitting: 54.5  . Smokeless tobacco: Never Used  Substance Use Topics  . Alcohol use: No    Alcohol/week: 0.0 oz  . Drug use: No    Review of Systems Constitutional: No fever/chills.  No lightheadedness or syncope.  Continues to be able to ambulate by himself or occasionally with a cane as needed; this is unchanged from baseline. Eyes: No visual changes. ENT: Positive mild sore throat.  No ear pain.  Positive mild congestion, rhinorrhea, and postnasal drip. Cardiovascular: Denies chest pain. Denies palpitations. Respiratory: Positive mild shortness of breath.  Positive nonproductive cough. Gastrointestinal: No abdominal pain.  No nausea, no vomiting.  No diarrhea.  No constipation. Genitourinary: Negative for dysuria. Musculoskeletal: Negative for back pain.  Stable symmetric lower extremity edema without calf pain. Skin: Negative for rash. Neurological: Negative for headaches. No focal numbness, tingling or weakness.  No  altered mental status.    ____________________________________________   PHYSICAL EXAM:  VITAL SIGNS: ED Triage Vitals  Enc Vitals Group     BP 01/16/18 1520 (!) 122/54     Pulse Rate 01/16/18 1520 90     Resp 01/16/18 1520 18     Temp 01/16/18 1520 98.4 F (36.9 C)     Temp Source 01/16/18 1520 Oral     SpO2 01/16/18 1520 99 %     Weight --      Height 01/16/18 1522 5\' 3"  (1.6 m)     Head Circumference --      Peak Flow --      Pain Score 01/16/18 1522 3     Pain Loc --      Pain Edu? --      Excl. in [place]? --     Constitutional: Alert and oriented to person and place but not year. Well appearing and in no acute distress. Answers questions appropriately. Eyes: Conjunctivae are normal.  EOMI. No scleral icterus. EARS: Right ear has a TM that is clear without fluid erythema or  bulge.  The left TM is obscured by cerumen. Head: Atraumatic. Nose: Positive congestion/rhinnorhea. Mouth/Throat: Mucous membranes are moist.  No posterior pharyngeal erythema, tonsillar swelling or exudate.  The posterior palate is symmetric and the uvula is midline peer Neck: No stridor.  Supple.  No meningismus. Cardiovascular: Normal rate, regular rhythm. No murmurs, rubs or gallops.  Respiratory: Normal respiratory effort.  No accessory muscle use or retractions. Lungs CTAB.  No wheezes, rales or ronchi.  Intermittent mild nonproductive coughing which appears to be linked to postnasal drip. Gastrointestinal: Soft, nontender and nondistended.  No guarding or rebound.  No peritoneal signs. Musculoskeletal: No LE edema. No ttp in the calves or palpable cords.  Negative Homan's sign. Neurologic:  A&Ox2; the patient does not know the year.  Speech is clear.  Face and smile are symmetric.  EOMI.  Moves all extremities well. Skin:  Skin is warm, dry and intact. No rash noted. Psychiatric: Mood and affect are normal. Speech and behavior are normal.  Normal  judgement.  ____________________________________________   LABS (all labs ordered are listed, but only abnormal results are displayed)  Labs Reviewed  CBC - Abnormal; Notable for the following components:      Result Value   RBC 3.52 (*)    Hemoglobin 11.2 (*)    HCT 33.5 (*)    All other components within normal limits  BASIC METABOLIC PANEL - Abnormal; Notable for the following components:   Sodium 128 (*)    Chloride 89 (*)    CO2 33 (*)    Glucose, Bld 109 (*)    Creatinine, Ser 1.27 (*)    Calcium 8.6 (*)    GFR calc non Af Amer 50 (*)    GFR calc Af Amer 58 (*)    All other components within normal limits  URINALYSIS, COMPLETE (UACMP) WITH MICROSCOPIC - Abnormal; Notable for the following components:   Color, Urine YELLOW (*)    APPearance CLEAR (*)    Bacteria, UA RARE (*)    All other components within normal limits  BASIC METABOLIC PANEL - Abnormal; Notable for the following components:   Sodium 132 (*)    Chloride 94 (*)    CO2 34 (*)    Calcium 8.3 (*)    Anion gap 4 (*)    All other components within normal limits  URINE CULTURE  BRAIN NATRIURETIC PEPTIDE  TROPONIN I   ____________________________________________  EKG  ED ECG REPORT I, Eula Listen, the attending physician, personally viewed and interpreted this ECG.   Date: 01/16/2018  EKG Time: 1520  Rate: 90  Rhythm: normal sinus rhythm  Axis: leftward  Intervals:none  ST&T Change: No STEMI; poor baseline tracing  This EKG is compared to 01/06/18 without any significant changes in morphology. ____________________________________________  [id number]  Dg Chest 2 View  Result Date: 01/16/2018 CLINICAL DATA:  Short of breath EXAM: CHEST - 2 VIEW COMPARISON:  01/06/2018, 01/04/2018, 12/16/2017 FINDINGS: Hyperinflation. Tiny right pleural effusion or thickening. No focal airspace disease. Stable cardiomediastinal silhouette. Aortic atherosclerosis. No pneumothorax. Mild air distention of  bowel in the upper abdomen with colonic inter positioning on the right. IMPRESSION: Tiny right pleural effusion or thickening. Electronically Signed   By: Donavan Foil M.D.   On: 01/16/2018 16:08    ____________________________________________   PROCEDURES  Procedure(s) performed: None  Procedures  Critical Care performed: No ____________________________________________   INITIAL IMPRESSION / ASSESSMENT AND PLAN / ED COURSE  Pertinent labs & imaging results that were available during  my care of the patient were reviewed by me and considered in my medical decision making (see chart for details).  84 y.o. male with a history of COPD and seasonal allergies presenting with a postnasal drip, nonproductive cough, congestion and rhinorrhea without fever or chills, mild sore throat, and mild shortness of breath.  Overall, the patient is chronically ill-appearing but nontoxic.  His O2 sats are 100% on room air and he is breathing normally without any abnormal cardiopulmonary auscultation findings.  His symptoms are most consistent with a postnasal drip, perhaps from either virus or seasonal allergies, causing a mild cough with shortness of breath.  I will plan to do a full evaluation including cardiac evaluation with EKG and troponin; there are no ischemic findings on his EKG.  He will also have a BNP as well as chest x-ray.  Plan reevaluation for final disposition.  ----------------------------------------- 4:16 PM on 01/16/2018 -----------------------------------------  The patient's laboratory studies show a stable anemia that is unchanged.  He does have some mild hyponatremia with a sodium of 128 today, which is not significantly changed from his prior 10 days ago, between 120 and 130.  However, he does have some renal insufficiency which is new with a creatinine of 1.27 from 0.8.  I will give the patient a liter of fluids and recheck both of these numbers to see if they are improving.  He has  a normal troponin.  His chest x-ray shows a right pleural effusion, which has been present in the past.  He continues to have O2 sats that are greater than 99% on room air, and greater than 95% with walking.  I discussed these findings with the patient and his family, who are in agreement with the plan.  ----------------------------------------- 5:31 PM on 01/16/2018 -----------------------------------------  The patient does have rare bacteria but no other signs of infection in his urine a urine culture has been sent.  The patient's BNP is reassuring.  His repeat BMP is pending.  ----------------------------------------- 7:23 PM on 01/16/2018 -----------------------------------------  The patient's repeat BMP does show significant improvement in his creatinine as well as sodium.  I have encouraged the patient to maintain good fluid intake at home, and have reiterated this with the patient's family as well.  At this time, the patient continues to be hemodynamically stable, and his family states he is at his normal mental status.  He is discharged in stable condition.  Follow-up instructions, as well as return precautions, were discussed with the patient and his family. ____________________________________________  FINAL CLINICAL IMPRESSION(S) / ED DIAGNOSES  Final diagnoses:  SOB (shortness of breath)  Postnasal drip  Cough  Hyponatremia  Acute renal insufficiency  Dehydration         NEW MEDICATIONS STARTED DURING THIS VISIT:  New Prescriptions   CETIRIZINE (ZYRTEC ALLERGY) 10 MG TABLET    Take 1 tablet (10 mg total) by mouth daily.      Eula Listen, MD 01/16/18 1617    Eula Listen, MD 01/16/18 1734    Eula Listen, MD 01/16/18 1924

## 2018-01-16 NOTE — ED Triage Notes (Signed)
Patient presents to the ED via EMS from home.  Patient is complaining of increased shortness of breath today.  Patient has histoyr of dementia, copd and chf.  EMS gave patient 1 duoneb treatment.  Reports oxygen saturation was 99% on room air prior to treatment.

## 2018-01-17 ENCOUNTER — Telehealth: Payer: Self-pay | Admitting: Neurology

## 2018-01-17 MED ORDER — MIRTAZAPINE 15 MG PO TABS: 15.0000 mg | ORAL_TABLET | Freq: Every day | ORAL | 3 refills | Status: DC

## 2018-01-17 NOTE — Telephone Encounter (Signed)
I will increase the dose of the Remeron to 15 mg at night.  A prescription will be sent in.

## 2018-01-17 NOTE — Telephone Encounter (Signed)
Patient's daughter calling regarding mirtazapine (REMERON) 7.5 MG tablet. Patient is doing fine on this medication and she thinks dosage should be increased because he is still very anxious. Please call and advise.Patient uses [facility] in [place].

## 2018-01-17 NOTE — Addendum Note (Signed)
Addended by: Kathrynn Ducking on: 01/17/2018 05:27 PM   Modules accepted: Orders

## 2018-01-18 LAB — URINE CULTURE

## 2018-01-19 ENCOUNTER — Ambulatory Visit (INDEPENDENT_AMBULATORY_CARE_PROVIDER_SITE_OTHER): Payer: Medicare Other | Admitting: Internal Medicine

## 2018-01-19 VITALS — BP 150/82 | HR 82 | Temp 98.2°F | Resp 18 | Wt 130.2 lb

## 2018-01-19 DIAGNOSIS — G9340 Encephalopathy, unspecified: Secondary | ICD-10-CM | POA: Diagnosis not present

## 2018-01-19 DIAGNOSIS — J9622 Acute and chronic respiratory failure with hypercapnia: Secondary | ICD-10-CM

## 2018-01-19 DIAGNOSIS — I10 Essential (primary) hypertension: Secondary | ICD-10-CM | POA: Diagnosis not present

## 2018-01-19 DIAGNOSIS — E039 Hypothyroidism, unspecified: Secondary | ICD-10-CM | POA: Diagnosis not present

## 2018-01-19 DIAGNOSIS — E871 Hypo-osmolality and hyponatremia: Secondary | ICD-10-CM | POA: Diagnosis not present

## 2018-01-19 DIAGNOSIS — E119 Type 2 diabetes mellitus without complications: Secondary | ICD-10-CM

## 2018-01-19 DIAGNOSIS — R413 Other amnesia: Secondary | ICD-10-CM

## 2018-01-19 DIAGNOSIS — D649 Anemia, unspecified: Secondary | ICD-10-CM

## 2018-01-19 LAB — BASIC METABOLIC PANEL
BUN: 15 mg/dL (ref 6–23)
CO2: 28 meq/L (ref 19–32)
Calcium: 9.2 mg/dL (ref 8.4–10.5)
Chloride: 91 mEq/L — ABNORMAL LOW (ref 96–112)
Creatinine, Ser: 1.07 mg/dL (ref 0.40–1.50)
GFR: 84.39 mL/min (ref >60.00)
GLUCOSE: 105 mg/dL — AB (ref 70–99)
POTASSIUM: 5.1 meq/L (ref 3.5–5.1)
Sodium: 130 mEq/L — ABNORMAL LOW (ref 135–145)

## 2018-01-19 MED ORDER — AZELASTINE HCL 0.1 % NA SOLN: 1.0000 | Freq: Two times a day (BID) | NASAL | 1 refills | Status: DC

## 2018-01-19 MED ORDER — MUPIROCIN 2 % EX OINT: TOPICAL_OINTMENT | CUTANEOUS | 0 refills | Status: DC

## 2018-01-19 NOTE — Progress Notes (Signed)
[name], male   DOB: 23-Jan-1930, 84 y.o.   MRN: [id number]   Subjective:    [name], male    DOB: Dec 31, 1929, 84 y.o.   MRN: [id number]  HPI  Patient here for hospital follow up.  He is accompanied by his wife. History obtained from both of them.  He was admitted 01/06/18 for altered mental status.  Diagnosed with acute encephalopathy felt to be likely multifactorial (hyponatremia and dementia).  Was given IVFs with some noted improvement in his sodium.  The low sodium was felt to be related to poor po intake and diuretics.  Lasix changed to prn.  CT scan revealed enteritis.  This was felt to be likely viral in nature.  Discharged home.  Since his discharge home, he was evaluated by Dr Alva Garnet and per note review he felt his respiratory status was at baseline.  Felt was experiencing some progression of dementia.  Recommended continue anoro inhaler, albuterol and duoneb.  Also recommended continuing BiPAP.  He also saw neurology 01/10/18 who felt that that the patient was continuing to progress with his memory.  Was started on remeron.  Recommended f/u in 6 months.  He also was reevaluated in ER 01/16/18 - for sob.  Was found to have persistent low sodium nad increased creatinine.  Was given fluids and repeat sodium improved.  Discharged home.  Comes in today for f/u.  States he is doing some better.  Still with decreased appetite.  Just started on remeron.  No abdominal pain.  No nausea or vomiting.  Bowels are moving.  No chest pain.  Breathing appears to be stable.  Wife also reports she needs a letter for long term care.  Pt needs help with dressing, bathing and preparing foods.  Cannot stay by himself.     Past Medical History:  Diagnosis Date  . Carpal tunnel syndrome   . Diabetes mellitus    Diet control   . DJD (degenerative joint disease), cervical   . Duodenal ulcer, with partial obstruction 08/10/2012  . Essential and other specified forms of tremor  12/16/2013  . Hyperlipidemia   . Hypertension    no medicine needed  . IBS (irritable bowel syndrome)   . Internal hemorrhoids   . Memory deficit 12/16/2013  . Numbness and tingling in right hand    started 2 yeas ago  . Peptic ulcer   . Personal history of colonic polyps    adenomatous  . Polyneuropathy in diabetes(357.2)   . Tremor, essential   . Vitamin D deficiency    Past Surgical History:  Procedure Laterality Date  . ANAL FISSURE REPAIR    . ANKLE SURGERY Right    right- pins placed in  . COLONOSCOPY W/ BIOPSIES AND POLYPECTOMY  8/03, 6/05, 7/09, 9/10   internal hemorrhoids, tubular adenomas, mucosa & lymphoid nodules  . INGUINAL HERNIA REPAIR Right   . TOTAL HIP ARTHROPLASTY Right 11/13/2012   Procedure: TOTAL HIP ARTHROPLASTY ANTERIOR APPROACH;  Surgeon: Mcarthur Rossetti, MD;  Location: [facility];  Service: Orthopedics;  Laterality: Right;  Right total hip arthroplasty  . UPPER GASTROINTESTINAL ENDOSCOPY  3/05, 7/09, 9/10,2013   gastritis, duodenitis   Family History  Problem Relation Age of Onset  . Thyroid cancer Daughter 45  . Diabetes Son   . Urolithiasis Son   . Leukemia Maternal Aunt   . Colon cancer Neg Hx   . Esophageal cancer Neg Hx   . Rectal cancer Neg  Hx   . Stomach cancer Neg Hx   . Prostate cancer Neg Hx   . Kidney disease Neg Hx   . Kidney cancer Neg Hx   . Bladder Cancer Neg Hx    Social History   Socioeconomic History  . Marital status: Married    Spouse name: Not on file  . Number of children: 3  . Years of education: MA  . Highest education level: Not on file  Occupational History  . Occupation: [employer]    CommentScientist, clinical (histocompatibility and immunogenetics)  Social Needs  . Financial resource strain: Not on file  . Food insecurity:    Worry: Not on file    Inability: Not on file  . Transportation needs:    Medical: Not on file    Non-medical: Not on file  Tobacco Use  . Smoking status: Former Smoker    Types: Cigarettes    Last attempt to quit:  07/02/1963    Years since quitting: 54.5  . Smokeless tobacco: Never Used  Substance and Sexual Activity  . Alcohol use: No    Alcohol/week: 0.0 oz  . Drug use: No  . Sexual activity: Not on file  Lifestyle  . Physical activity:    Days per week: Not on file    Minutes per session: Not on file  . Stress: Not on file  Relationships  . Social connections:    Talks on phone: Not on file    Gets together: Not on file    Attends religious service: Not on file    Active member of club or organization: Not on file    Attends meetings of clubs or organizations: Not on file    Relationship status: Not on file  Other Topics Concern  . Not on file  Social History Narrative   Lives with wife    Caffeine use: 1 cup coffee per day   Right handed    Veteran [employer]    Outpatient Encounter Medications as of 01/19/2018  Medication Sig  . azelastine (ASTELIN) 0.1 % nasal spray Place 1 spray into both nostrils 2 (two) times daily. Use in each nostril as directed  . cetirizine (ZYRTEC ALLERGY) 10 MG tablet Take 1 tablet (10 mg total) by mouth daily.  . Cholecalciferol (VITAMIN D3) 50000 units CAPS Take 50,000 Units by mouth once a week.  . finasteride (PROSCAR) 5 MG tablet Take 1 tablet (5 mg total) by mouth daily.  . fluticasone (FLONASE) 50 MCG/ACT nasal spray Place 2 sprays into both nostrils daily.  . furosemide (LASIX) 40 MG tablet Take 0.5 tablets (20 mg total) by mouth daily as needed for edema.  Marland Kitchen levothyroxine (SYNTHROID, LEVOTHROID) 50 MCG tablet Take 1 tablet (50 mcg total) by mouth daily before breakfast.  . losartan (COZAAR) 50 MG tablet TAKE 1 TABLET BY MOUTH ONCE DAILY  . magnesium oxide (MAG-OX) 400 (241.3 Mg) MG tablet TAKE 1 TABLET BY MOUTH ONCE DAILY  . mirtazapine (REMERON) 15 MG tablet Take 1 tablet (15 mg total) by mouth at bedtime.  . mupirocin ointment (BACTROBAN) 2 % Apply to affected area bid  . pantoprazole (PROTONIX) 40 MG tablet TAKE 1 TABLET BY MOUTH ONCE DAILY  .  PROAIR HFA 108 (90 Base) MCG/ACT inhaler INHALE 1-2 PUFFS INTO THE LUNGS EVERY 6 HOURS AS NEEDED FOR WHEEZING OR SHORTNESS OF BREATH  . saccharomyces boulardii (FLORASTOR) 250 MG capsule Take 250 mg by mouth as needed.   . tamsulosin (FLOMAX) 0.4 MG CAPS capsule Take  1 capsule (0.4 mg total) by mouth daily.  Marland Kitchen umeclidinium-vilanterol (ANORO ELLIPTA) 62.5-25 MCG/INH AEPB Inhale 1 puff into the lungs daily.   No facility-administered encounter medications on file as of 01/19/2018.     Review of Systems  Constitutional: Negative for unexpected weight change.       Decreased appetite.    HENT: Negative for sinus pressure.        Some nasal congestion with drainage.    Respiratory: Negative for cough and chest tightness.        Breathing stable.    Cardiovascular: Negative for chest pain and palpitations.  Gastrointestinal: Negative for abdominal pain, diarrhea, nausea and vomiting.  Genitourinary: Negative for difficulty urinating and dysuria.  Musculoskeletal: Negative for joint swelling and myalgias.  Skin: Negative for color change and rash.  Neurological: Negative for dizziness, light-headedness and headaches.  Psychiatric/Behavioral: Negative for agitation and dysphoric mood.       Objective:    Physical Exam  Constitutional: He appears well-developed and well-nourished. No distress.  HENT:  Nose: Nose normal.  Mouth/Throat: Oropharynx is clear and moist.  Neck: Neck supple. No thyromegaly present.  Cardiovascular: Normal rate and regular rhythm.  Pulmonary/Chest: Effort normal and breath sounds normal. No respiratory distress.  Abdominal: Soft. Bowel sounds are normal. There is no tenderness.  Musculoskeletal: He exhibits no edema or tenderness.  Lymphadenopathy:    He has no cervical adenopathy.  Skin: No rash noted. No erythema.  Psychiatric: He has a normal mood and affect. His behavior is normal.    BP (!) 150/82 (BP Location: Left Arm, Patient Position: Sitting, Cuff  Size: Normal)   Pulse 82   Temp 98.2 F (36.8 C) (Oral)   Resp 18   Wt 130 lb 3.2 oz (59.1 kg)   SpO2 93%   BMI 23.06 kg/m  Wt Readings from Last 3 Encounters:  01/19/18 130 lb 3.2 oz (59.1 kg)  01/10/18 130 lb (59 kg)  01/07/18 127 lb 9.6 oz (57.9 kg)     Lab Results  Component Value Date   WBC 7.4 01/16/2018   HGB 11.2 (L) 01/16/2018   HCT 33.5 (L) 01/16/2018   PLT 254 01/16/2018   GLUCOSE 105 (H) 01/19/2018   CHOL 177 12/12/2017   TRIG 61.0 12/12/2017   HDL 76.20 12/12/2017   LDLCALC 88 12/12/2017   ALT 12 (L) 01/06/2018   AST 21 01/06/2018   NA 130 (L) 01/19/2018   K 5.1 01/19/2018   CL 91 (L) 01/19/2018   CREATININE 1.07 01/19/2018   BUN 15 01/19/2018   CO2 28 01/19/2018   TSH 2.285 01/07/2018   PSA 0.83 02/06/2015   INR 0.94 11/06/2012   HGBA1C 6.0 12/12/2017   MICROALBUR 93.8 (H) 06/24/2015    Dg Chest 2 View  Result Date: 01/16/2018 CLINICAL DATA:  Short of breath EXAM: CHEST - 2 VIEW COMPARISON:  01/06/2018, 01/04/2018, 12/16/2017 FINDINGS: Hyperinflation. Tiny right pleural effusion or thickening. No focal airspace disease. Stable cardiomediastinal silhouette. Aortic atherosclerosis. No pneumothorax. Mild air distention of bowel in the upper abdomen with colonic inter positioning on the right. IMPRESSION: Tiny right pleural effusion or thickening. Electronically Signed   By: Donavan Foil M.D.   On: 01/16/2018 16:08       Assessment & Plan:   Problem List Items Addressed This Visit    Acute encephalopathy    In ER recently for mental status changes.  Doing better now.  Using BIPAP.  Saw neurology recently.  Felt to be  progression with his memory issues.  Started on remeron.        Acute on chronic respiratory failure with hypercapnia (HCC)    Using BIPAP.        Anemia    Follow cbc.       Diabetes mellitus ([place])    Follow met b and a1c.        Hypertension    Blood pressure on recheck improved.  Continue to follow.        Hyponatremia  - Primary    Recently in ER and given IVFs.  Sodium improved some.  With decreased po intake. Feel contributing.  Has f/u with nephrology next week and plans to have f/u labs.        Relevant Orders   Basic metabolic panel (Completed)   Hypothyroidism    On thyroid replacement.  Follow tsh.        Memory deficit (Chronic)    Seeing neurology.  Using BIPAP.  Started on remeron.            [name], MD

## 2018-01-21 ENCOUNTER — Encounter: Payer: Self-pay | Admitting: Internal Medicine

## 2018-01-21 NOTE — Assessment & Plan Note (Signed)
Seeing neurology.  Using BIPAP.  Started on remeron.

## 2018-01-21 NOTE — Assessment & Plan Note (Signed)
In ER recently for mental status changes.  Doing better now.  Using BIPAP.  Saw neurology recently.  Felt to be progression with his memory issues.  Started on remeron.

## 2018-01-21 NOTE — Assessment & Plan Note (Signed)
Follow cbc.  

## 2018-01-21 NOTE — Assessment & Plan Note (Signed)
Using BIPAP.

## 2018-01-21 NOTE — Assessment & Plan Note (Signed)
Recently in ER and given IVFs.  Sodium improved some.  With decreased po intake. Feel contributing.  Has f/u with nephrology next week and plans to have f/u labs.

## 2018-01-21 NOTE — Assessment & Plan Note (Signed)
Follow met b and a1c.

## 2018-01-21 NOTE — Assessment & Plan Note (Signed)
Blood pressure on recheck improved.  Continue to follow.

## 2018-01-21 NOTE — Assessment & Plan Note (Signed)
On thyroid replacement.  Follow tsh.  

## 2018-01-22 ENCOUNTER — Encounter: Payer: Self-pay | Admitting: Internal Medicine

## 2018-01-23 NOTE — Telephone Encounter (Signed)
Pt was seen on 4/26

## 2018-01-24 ENCOUNTER — Telehealth: Payer: Self-pay

## 2018-01-24 ENCOUNTER — Ambulatory Visit: Payer: Medicare Other

## 2018-01-24 ENCOUNTER — Ambulatory Visit: Payer: Medicare Other | Admitting: Neurology

## 2018-01-24 NOTE — Telephone Encounter (Signed)
Placed dictated letter in your blue folder for signature.

## 2018-01-25 NOTE — Telephone Encounter (Signed)
Letter signed and placed in box.

## 2018-01-29 MED ORDER — MEMANTINE HCL 5 MG PO TABS: ORAL_TABLET | ORAL | 0 refills | Status: DC

## 2018-01-29 NOTE — Telephone Encounter (Signed)
I called the daughter is still having delusional thinking processes, he is eating better on the Remeron and gaining some weight.  Delusional thinking is very difficult to control with medications.  He has some issues about air quality inside his home.  He will be placed on Namenda for the memory issue.  He will follow-up with his next revisit.

## 2018-01-29 NOTE — Telephone Encounter (Signed)
Called patients wife to let her know that I have placed letter in the mail for her.

## 2018-01-29 NOTE — Addendum Note (Signed)
Addended by: Kathrynn Ducking on: 01/29/2018 12:44 PM   Modules accepted: Orders

## 2018-01-29 NOTE — Telephone Encounter (Signed)
Pts daughter called stating that since the increase of  mirtazapine (REMERON) 7.5 MG tablet there has been no change in the pts behavior. [name] requesting a call back to discuss other medications

## 2018-01-30 ENCOUNTER — Other Ambulatory Visit: Payer: Self-pay | Admitting: Internal Medicine

## 2018-01-30 NOTE — Telephone Encounter (Signed)
Faxed printed/signed rx memantine 5mg  tab to [facility] at [phone]. Received fax confirmation.

## 2018-01-31 ENCOUNTER — Telehealth: Payer: Self-pay | Admitting: Internal Medicine

## 2018-01-31 DIAGNOSIS — J449 Chronic obstructive pulmonary disease, unspecified: Secondary | ICD-10-CM

## 2018-01-31 DIAGNOSIS — R531 Weakness: Secondary | ICD-10-CM

## 2018-01-31 DIAGNOSIS — E119 Type 2 diabetes mellitus without complications: Secondary | ICD-10-CM

## 2018-01-31 DIAGNOSIS — J9622 Acute and chronic respiratory failure with hypercapnia: Secondary | ICD-10-CM

## 2018-01-31 NOTE — Telephone Encounter (Signed)
Copied from Kim [id number]. Topic: Quick Communication - See Telephone Encounter >> Jan 31, 2018  9:08 AM Ether Griffins B wrote: CRM for notification. See Telephone encounter for: 01/31/18.  Pt's wife calling wanting a list of home health agencies that can come out and help her husband. To help him with daily activities such as bathing,  getting dressed, and to sit with him a few hours so she can do errands. She has contacted [facility] and they state they do not do that.

## 2018-02-02 NOTE — Telephone Encounter (Signed)
I have placed order for Home Health.  Someone should be contacting her.

## 2018-02-02 NOTE — Telephone Encounter (Signed)
Patient's wife has requested referral for home health so she can try to have someone come out.

## 2018-02-05 NOTE — Telephone Encounter (Signed)
Patient's wife is aware 

## 2018-02-08 ENCOUNTER — Ambulatory Visit: Payer: Medicare Other | Admitting: Podiatry

## 2018-02-12 ENCOUNTER — Ambulatory Visit: Payer: Medicare Other | Admitting: Podiatry

## 2018-03-05 ENCOUNTER — Other Ambulatory Visit: Payer: Self-pay | Admitting: Internal Medicine

## 2018-03-06 ENCOUNTER — Emergency Department
Admission: EM | Admit: 2018-03-06 | Discharge: 2018-03-07 | Disposition: A | Discharge status: Home / Self Care | Payer: Medicare Other | Attending: Emergency Medicine | Admitting: Emergency Medicine

## 2018-03-06 ENCOUNTER — Encounter: Payer: Self-pay | Admitting: *Deleted

## 2018-03-06 ENCOUNTER — Emergency Department: Payer: Medicare Other

## 2018-03-06 ENCOUNTER — Other Ambulatory Visit: Payer: Self-pay

## 2018-03-06 DIAGNOSIS — Z91040 Latex allergy status: Secondary | ICD-10-CM | POA: Diagnosis not present

## 2018-03-06 DIAGNOSIS — Z87891 Personal history of nicotine dependence: Secondary | ICD-10-CM | POA: Insufficient documentation

## 2018-03-06 DIAGNOSIS — I10 Essential (primary) hypertension: Secondary | ICD-10-CM | POA: Diagnosis not present

## 2018-03-06 DIAGNOSIS — R159 Full incontinence of feces: Secondary | ICD-10-CM | POA: Diagnosis not present

## 2018-03-06 DIAGNOSIS — E119 Type 2 diabetes mellitus without complications: Secondary | ICD-10-CM | POA: Diagnosis not present

## 2018-03-06 DIAGNOSIS — R110 Nausea: Secondary | ICD-10-CM | POA: Diagnosis not present

## 2018-03-06 DIAGNOSIS — R109 Unspecified abdominal pain: Secondary | ICD-10-CM

## 2018-03-06 DIAGNOSIS — Z79899 Other long term (current) drug therapy: Secondary | ICD-10-CM | POA: Insufficient documentation

## 2018-03-06 DIAGNOSIS — F0390 Unspecified dementia without behavioral disturbance: Secondary | ICD-10-CM | POA: Insufficient documentation

## 2018-03-06 DIAGNOSIS — R32 Unspecified urinary incontinence: Secondary | ICD-10-CM | POA: Diagnosis not present

## 2018-03-06 LAB — LIPASE, BLOOD: LIPASE: 42 U/L (ref 11–51)

## 2018-03-06 LAB — CBC WITH DIFFERENTIAL/PLATELET
BASOS ABS: 0.1 10*3/uL (ref 0–0.1)
BASOS PCT: 1 %
EOS ABS: 0.4 10*3/uL (ref 0–0.7)
Eosinophils Relative: 4 %
HEMATOCRIT: 34.1 % — AB (ref 40.0–52.0)
Hemoglobin: 11.4 g/dL — ABNORMAL LOW (ref 13.0–18.0)
Lymphocytes Relative: 12 %
Lymphs Abs: 1.1 10*3/uL (ref 1.0–3.6)
MCH: 31.7 pg (ref 26.0–34.0)
MCHC: 33.5 g/dL (ref 32.0–36.0)
MCV: 94.6 fL (ref 80.0–100.0)
MONO ABS: 0.9 10*3/uL (ref 0.2–1.0)
MONOS PCT: 10 %
NEUTROS ABS: 6.7 10*3/uL — AB (ref 1.4–6.5)
NEUTROS PCT: 73 %
Platelets: 285 10*3/uL (ref 150–440)
RBC: 3.6 MIL/uL — ABNORMAL LOW (ref 4.40–5.90)
RDW: 13.8 % (ref 11.5–14.5)
WBC: 9.2 10*3/uL (ref 3.8–10.6)

## 2018-03-06 LAB — LACTIC ACID, PLASMA: LACTIC ACID, VENOUS: 0.9 mmol/L (ref 0.5–1.9)

## 2018-03-06 LAB — URINALYSIS, COMPLETE (UACMP) WITH MICROSCOPIC
Bacteria, UA: NONE SEEN
Bilirubin Urine: NEGATIVE
GLUCOSE, UA: NEGATIVE mg/dL
HGB URINE DIPSTICK: NEGATIVE
Ketones, ur: NEGATIVE mg/dL
Leukocytes, UA: NEGATIVE
NITRITE: NEGATIVE
PH: 7 (ref 5.0–8.0)
Protein, ur: NEGATIVE mg/dL
SPECIFIC GRAVITY, URINE: 1.012 (ref 1.005–1.030)

## 2018-03-06 LAB — COMPREHENSIVE METABOLIC PANEL
ALK PHOS: 56 U/L (ref 38–126)
ALT: 13 U/L — AB (ref 17–63)
AST: 23 U/L (ref 15–41)
Albumin: 3.5 g/dL (ref 3.5–5.0)
Anion gap: 8 (ref 5–15)
BILIRUBIN TOTAL: 0.5 mg/dL (ref 0.3–1.2)
BUN: 34 mg/dL — AB (ref 6–20)
CALCIUM: 8.3 mg/dL — AB (ref 8.9–10.3)
CO2: 28 mmol/L (ref 22–32)
CREATININE: 1.03 mg/dL (ref 0.61–1.24)
Chloride: 98 mmol/L — ABNORMAL LOW (ref 101–111)
Glucose, Bld: 84 mg/dL (ref 65–99)
Potassium: 4.2 mmol/L (ref 3.5–5.1)
Sodium: 134 mmol/L — ABNORMAL LOW (ref 135–145)
Total Protein: 6.1 g/dL — ABNORMAL LOW (ref 6.5–8.1)

## 2018-03-06 LAB — TROPONIN I

## 2018-03-06 MED ORDER — FENTANYL CITRATE (PF) 100 MCG/2ML IJ SOLN
50.0000 ug | Freq: Once | INTRAMUSCULAR | Status: AC
  Administered 2018-03-06: 50 ug via INTRAVENOUS
  Filled 2018-03-06: qty 2

## 2018-03-06 MED ORDER — SODIUM CHLORIDE 0.9 % IV BOLUS
1000.0000 mL | Freq: Once | INTRAVENOUS | Status: AC
  Administered 2018-03-06: 1000 mL via INTRAVENOUS

## 2018-03-06 MED ORDER — IOHEXOL 300 MG/ML  SOLN
100.0000 mL | Freq: Once | INTRAMUSCULAR | Status: AC | PRN
  Administered 2018-03-06: 100 mL via INTRAVENOUS

## 2018-03-06 NOTE — ED Triage Notes (Signed)
Pt arrived via EMS from home with complaints of abd pain x 3 + days per pts recollection. Pt lives with wife and has some dementia. Incontinent of urine and skin is raw. EDP at bedside.

## 2018-03-06 NOTE — ED Notes (Signed)
Family at bedside. State pt has been complaining x a couple hours of stomach pain and was insistent that he comes to see a doctor. Pt also has a dental appointment this am that was cancelled. Pt is sore on the lower right side of his mouth from his dentures

## 2018-03-06 NOTE — ED Provider Notes (Signed)
[facility] Emergency Department Provider Note  ____________________________________________   First MD Initiated Contact with Patient 03/06/18 2211     (approximate)  I have reviewed the triage vital signs and the nursing notes.   HISTORY  Chief Complaint No chief complaint on file.  Level 5 exemption history limited by the patient's dementia.  HPI Aaron Hendrix is a 84 y.o. male who comes to the emergency department via EMS with abdominal pain and distention for the past 3 days.  Apparently the patient is incontinent to urine and feces.  He is unable to provide any meaningful history stating he is in pain but is not sure if he wants pain medicine.  She is nauseated.  He is not sure when he last passed a bowel movement or flatus.  He is unsure if she is ever had abdominal surgery although on chart review he does have a previous inguinal hernia repair.  Past Medical History:  Diagnosis Date  . Carpal tunnel syndrome   . Diabetes mellitus    Diet control   . DJD (degenerative joint disease), cervical   . Duodenal ulcer, with partial obstruction 08/10/2012  . Essential and other specified forms of tremor 12/16/2013  . Hyperlipidemia   . Hypertension    no medicine needed  . IBS (irritable bowel syndrome)   . Internal hemorrhoids   . Memory deficit 12/16/2013  . Numbness and tingling in right hand    started 2 yeas ago  . Peptic ulcer   . Personal history of colonic polyps    adenomatous  . Polyneuropathy in diabetes(357.2)   . Tremor, essential   . Vitamin D deficiency     Patient Active Problem List   Diagnosis Date Noted  . Acute encephalopathy 01/07/2018  . Anemia 12/12/2017  . Healthcare maintenance 09/14/2017  . Respiratory failure with hypoxia and hypercapnia ([place]) 03/19/2017  . Palliative care by specialist 03/09/2017  . Hypercapnia   . Acute on chronic respiratory failure with hypercapnia ([place]) 03/08/2017  . Hypoxia 11/25/2016    . COPD exacerbation ([place]) 11/25/2016  . Rash 01/18/2016  . Nocturia 08/11/2015  . BPH with obstruction/lower urinary tract symptoms 08/11/2015  . Hematemesis 07/29/2015  . Ileus ([place]) 07/29/2015  . Lower extremity edema 07/16/2015  . Increased urinary frequency 07/16/2015  . COPD, mild ([place]) 07/16/2015  . Hyponatremia 06/25/2015  . Skin lesion 02/16/2015  . DNR (do not resuscitate) discussion 02/16/2015  . Hypothyroidism 09/27/2014  . Chronic constipation 09/01/2014  . Trigger thumb 06/22/2014  . Abdominal pain 06/22/2014  . Personal history of other specified conditions 02/20/2014  . Decreased anal sphincter tone 02/19/2014  . Other specified symptoms and signs involving the digestive system and abdomen 02/19/2014  . Diabetes mellitus ([place]) 02/19/2014  . Memory deficit 12/16/2013  . Essential and other specified forms of tremor 12/16/2013  . Amnesia 12/16/2013  . Loss of weight 09/10/2013  . Periumbilical abdominal pain 09/10/2013  . Dermatophytosis of foot 07/01/2013  . Degenerative arthritis of hip 11/13/2012  . DU (duodenal ulcer) 08/22/2012  . Nausea 08/10/2012  . Hypertension 05/25/2011  . Diabetes mellitus, type II ([place]) 05/25/2011  . History of colonic polyps 03/31/2008  . Adaptive colitis 03/31/2008    Past Surgical History:  Procedure Laterality Date  . ANAL FISSURE REPAIR    . ANKLE SURGERY Right    right- pins placed in  . COLONOSCOPY W/ BIOPSIES AND POLYPECTOMY  8/03, 6/05, 7/09, 9/10   internal hemorrhoids, tubular adenomas, mucosa &  lymphoid nodules  . INGUINAL HERNIA REPAIR Right   . TOTAL HIP ARTHROPLASTY Right 11/13/2012   Procedure: TOTAL HIP ARTHROPLASTY ANTERIOR APPROACH;  Surgeon: Mcarthur Rossetti, MD;  Location: [facility];  Service: Orthopedics;  Laterality: Right;  Right total hip arthroplasty  . UPPER GASTROINTESTINAL ENDOSCOPY  3/05, 7/09, 9/10,2013   gastritis, duodenitis    Prior to Admission medications   Medication Sig Start Date End  Date Taking? Authorizing Provider  azelastine (ASTELIN) 0.1 % nasal spray USE 1 SPRAY IN BOTH NOSTRILS TWICE DAILYAS DIRECTED. 03/05/18   [name], MD  cetirizine (ZYRTEC ALLERGY) 10 MG tablet Take 1 tablet (10 mg total) by mouth daily. 01/16/18   Eula Listen, MD  Cholecalciferol (VITAMIN D3) 50000 units CAPS Take 50,000 Units by mouth once a week.    [provider]  finasteride (PROSCAR) 5 MG tablet Take 1 tablet (5 mg total) by mouth daily. 11/22/17   Zara Council A, PA-C  fluticasone (FLONASE) 50 MCG/ACT nasal spray Place 2 sprays into both nostrils daily. 01/16/18   Eula Listen, MD  furosemide (LASIX) 40 MG tablet Take 0.5 tablets (20 mg total) by mouth daily as needed for edema. 01/08/18   Fritzi Mandes, MD  levothyroxine (SYNTHROID, LEVOTHROID) 50 MCG tablet Take 1 tablet (50 mcg total) by mouth daily before breakfast. 12/02/16   [name], MD  losartan (COZAAR) 50 MG tablet TAKE 1 TABLET BY MOUTH ONCE DAILY 01/31/18   [name], MD  magnesium oxide (MAG-OX) 400 (241.3 Mg) MG tablet TAKE 1 TABLET BY MOUTH ONCE DAILY 01/02/18   [name], MD  memantine [facility]) 5 MG tablet Take 1 tablet daily for one week, then take 1 tablet twice daily for one week, then take 1 tablet in the morning and 2 in the evening for one week, then take 2 tablets twice daily 01/29/18   Kathrynn Ducking, MD  mirtazapine (REMERON) 15 MG tablet Take 1 tablet (15 mg total) by mouth at bedtime. 01/17/18   Kathrynn Ducking, MD  mupirocin ointment Drue Stager) 2 % Apply to affected area bid 01/19/18   [name], MD  pantoprazole (PROTONIX) 40 MG tablet TAKE 1 TABLET BY MOUTH ONCE DAILY 09/11/17   [name], MD  PROAIR HFA 108 [phone] Base) MCG/ACT inhaler INHALE 1-2 PUFFS INTO THE LUNGS EVERY 6 HOURS AS NEEDED FOR WHEEZING OR SHORTNESS OF BREATH 09/11/17   [name], MD  saccharomyces boulardii (FLORASTOR) 250 MG capsule Take 250 mg by mouth as needed.     [provider]  tamsulosin (FLOMAX) 0.4 MG CAPS capsule Take 1 capsule (0.4 mg total) by mouth daily. 11/22/17   [name], [name], PA-C  umeclidinium-vilanterol (ANORO ELLIPTA) 62.5-25 MCG/INH AEPB Inhale 1 puff into the lungs daily. 04/03/17   [name], MD    Allergies Fexofenadine; Quinapril hcl; and Latex  Family History  Problem Relation Age of Onset  . Thyroid cancer Daughter 80  . Diabetes Son   . Urolithiasis Son   . Leukemia Maternal Aunt   . Colon cancer Neg Hx   . Esophageal cancer Neg Hx   . Rectal cancer Neg Hx   . Stomach cancer Neg Hx   . Prostate cancer Neg Hx   . Kidney disease Neg Hx   . Kidney cancer Neg Hx   . Bladder Cancer Neg Hx     Social History Social History   Tobacco Use  . Smoking status: Former Smoker    Types: Cigarettes  Last attempt to quit: 07/02/1963    Years since quitting: 54.7  . Smokeless tobacco: Never Used  Substance Use Topics  . Alcohol use: No    Alcohol/week: 0.0 oz  . Drug use: No    Review of Systems Level 5 exemption history limited by the patient's dementia  ____________________________________________   PHYSICAL EXAM:  VITAL SIGNS: ED Triage Vitals  Enc Vitals Group     BP      Pulse      Resp      Temp      Temp src      SpO2      Weight      Height      Head Circumference      Peak Flow      Pain Score      Pain Loc      Pain Edu?      Excl. in [place]?     Constitutional: Pleasant and cooperative although in obvious distress significant dementia Eyes: PERRL EOMI. Head: Atraumatic. Nose: No congestion/rhinnorhea. Mouth/Throat: No trismus Neck: No stridor.   Cardiovascular: Normal rate, regular rhythm. Grossly normal heart sounds.  Good peripheral circulation. Respiratory: Normal respiratory effort.  No retractions. Lungs CTAB and moving good air Gastrointestinal: Distended abdomen.  Diffusely tender with some rebound but no frank peritonitis Rectal exam no hemorrhoids or  prolapse Musculoskeletal: No lower extremity edema   Neurologic:  No gross focal neurologic deficits are appreciated. Skin:  Skin is warm, dry and intact. No rash noted. Psychiatric: Pleasantly demented  ____________________________________________   DIFFERENTIAL includes but not limited to  Large bowel obstruction, small bowel obstruction, volvulus, appendicitis, diverticulitis ____________________________________________   LABS (all labs ordered are listed, but only abnormal results are displayed)  Labs Reviewed  CBC WITH DIFFERENTIAL/PLATELET - Abnormal; Notable for the following components:      Result Value   RBC 3.60 (*)    Hemoglobin 11.4 (*)    HCT 34.1 (*)    Neutro Abs 6.7 (*)    All other components within normal limits  COMPREHENSIVE METABOLIC PANEL  LIPASE, BLOOD  TROPONIN I  LACTIC ACID, PLASMA  LACTIC ACID, PLASMA  URINALYSIS, COMPLETE (UACMP) WITH MICROSCOPIC    Lab work is pending at this time __________________________________________  EKG  ED ECG REPORT I, Darel Hong, the attending physician, personally viewed and interpreted this ECG.  Date: 03/06/2018 EKG Time:  Rate: 68 Rhythm: normal sinus rhythm QRS Axis: Leftward axis Intervals: normal ST/T Wave abnormalities: normal Narrative Interpretation: no evidence of acute ischemia.  Morphology consistent with old  ____________________________________________  RADIOLOGY  CT abdomen pelvis pending at this time ____________________________________________   PROCEDURES  Procedure(s) performed: no  Procedures  Critical Care performed: no  Observation: no ____________________________________________   INITIAL IMPRESSION / ASSESSMENT AND PLAN / ED COURSE  Pertinent labs & imaging results that were available during my care of the patient were reviewed by me and considered in my medical decision making (see chart for details).  The patient arrives obviously uncomfortable appearing  with a tender and distended abdomen.  Broad labs including a lactic acid and troponin are pending as well as a CT scan with IV contrast in and out urinalysis.  IV fentanyl now for pain control.  Care will be signed over to Dr. Owens Shark who will determine ultimate disposition pending results of CT scan and lab work.      ____________________________________________   FINAL CLINICAL IMPRESSION(S) / ED DIAGNOSES  Final diagnoses:  Abdominal  pain, unspecified abdominal location      NEW MEDICATIONS STARTED DURING THIS VISIT:  New Prescriptions   No medications on file     Note:  This document was prepared using Dragon voice recognition software and may include unintentional dictation errors.     Darel Hong, MD 03/06/18 2259

## 2018-03-11 ENCOUNTER — Emergency Department: Payer: Medicare Other

## 2018-03-11 ENCOUNTER — Emergency Department
Admission: EM | Admit: 2018-03-11 | Discharge: 2018-03-11 | Disposition: A | Discharge status: Home / Self Care | Payer: Medicare Other | Attending: Student in an Organized Health Care Education/Training Program | Admitting: Student in an Organized Health Care Education/Training Program

## 2018-03-11 ENCOUNTER — Other Ambulatory Visit: Payer: Self-pay

## 2018-03-11 DIAGNOSIS — J449 Chronic obstructive pulmonary disease, unspecified: Secondary | ICD-10-CM | POA: Insufficient documentation

## 2018-03-11 DIAGNOSIS — F0390 Unspecified dementia without behavioral disturbance: Secondary | ICD-10-CM | POA: Insufficient documentation

## 2018-03-11 DIAGNOSIS — Z87891 Personal history of nicotine dependence: Secondary | ICD-10-CM | POA: Diagnosis not present

## 2018-03-11 DIAGNOSIS — Z79899 Other long term (current) drug therapy: Secondary | ICD-10-CM | POA: Insufficient documentation

## 2018-03-11 DIAGNOSIS — Z96641 Presence of right artificial hip joint: Secondary | ICD-10-CM | POA: Insufficient documentation

## 2018-03-11 DIAGNOSIS — R1084 Generalized abdominal pain: Secondary | ICD-10-CM | POA: Insufficient documentation

## 2018-03-11 DIAGNOSIS — I129 Hypertensive chronic kidney disease with stage 1 through stage 4 chronic kidney disease, or unspecified chronic kidney disease: Secondary | ICD-10-CM | POA: Insufficient documentation

## 2018-03-11 DIAGNOSIS — R109 Unspecified abdominal pain: Secondary | ICD-10-CM

## 2018-03-11 DIAGNOSIS — Z91040 Latex allergy status: Secondary | ICD-10-CM | POA: Diagnosis not present

## 2018-03-11 DIAGNOSIS — E039 Hypothyroidism, unspecified: Secondary | ICD-10-CM | POA: Insufficient documentation

## 2018-03-11 DIAGNOSIS — E860 Dehydration: Secondary | ICD-10-CM

## 2018-03-11 DIAGNOSIS — E1122 Type 2 diabetes mellitus with diabetic chronic kidney disease: Secondary | ICD-10-CM | POA: Diagnosis not present

## 2018-03-11 DIAGNOSIS — N189 Chronic kidney disease, unspecified: Secondary | ICD-10-CM | POA: Insufficient documentation

## 2018-03-11 LAB — CBC
HCT: 34.6 % — ABNORMAL LOW (ref 40.0–52.0)
Hemoglobin: 11.4 g/dL — ABNORMAL LOW (ref 13.0–18.0)
MCH: 31.6 pg (ref 26.0–34.0)
MCHC: 33.1 g/dL (ref 32.0–36.0)
MCV: 95.7 fL (ref 80.0–100.0)
Platelets: 263 10*3/uL (ref 150–440)
RBC: 3.62 MIL/uL — ABNORMAL LOW (ref 4.40–5.90)
RDW: 13.2 % (ref 11.5–14.5)
WBC: 7.8 10*3/uL (ref 3.8–10.6)

## 2018-03-11 LAB — COMPREHENSIVE METABOLIC PANEL
ALBUMIN: 3.7 g/dL (ref 3.5–5.0)
ALK PHOS: 68 U/L (ref 38–126)
ALT: 12 U/L — AB (ref 17–63)
AST: 24 U/L (ref 15–41)
Anion gap: 8 (ref 5–15)
BILIRUBIN TOTAL: 0.3 mg/dL (ref 0.3–1.2)
BUN: 31 mg/dL — AB (ref 6–20)
CALCIUM: 8.7 mg/dL — AB (ref 8.9–10.3)
CO2: 30 mmol/L (ref 22–32)
CREATININE: 1.38 mg/dL — AB (ref 0.61–1.24)
Chloride: 95 mmol/L — ABNORMAL LOW (ref 101–111)
GFR calc Af Amer: 52 mL/min — ABNORMAL LOW (ref >60)
GFR calc non Af Amer: 45 mL/min — ABNORMAL LOW (ref >60)
GLUCOSE: 126 mg/dL — AB (ref 65–99)
Potassium: 4.3 mmol/L (ref 3.5–5.1)
SODIUM: 133 mmol/L — AB (ref 135–145)
TOTAL PROTEIN: 6.5 g/dL (ref 6.5–8.1)

## 2018-03-11 LAB — LIPASE, BLOOD: Lipase: 38 U/L (ref 11–51)

## 2018-03-11 MED ORDER — METOCLOPRAMIDE HCL 5 MG PO TABS: 5.0000 mg | ORAL_TABLET | Freq: Three times a day (TID) | ORAL | 1 refills | Status: AC | PRN

## 2018-03-11 MED ORDER — METOCLOPRAMIDE HCL 5 MG/ML IJ SOLN: 10.0000 mg | Freq: Once | INTRAMUSCULAR | Status: DC

## 2018-03-11 MED ORDER — MIRTAZAPINE 15 MG PO TABS
15.0000 mg | ORAL_TABLET | Freq: Every day | ORAL | Status: DC
  Administered 2018-03-11: 15 mg via ORAL
  Filled 2018-03-11: qty 1

## 2018-03-11 MED ORDER — METOCLOPRAMIDE HCL 10 MG PO TABS
5.0000 mg | ORAL_TABLET | Freq: Once | ORAL | Status: AC
  Administered 2018-03-11: 5 mg via ORAL
  Filled 2018-03-11: qty 1

## 2018-03-11 MED ORDER — TAMSULOSIN HCL 0.4 MG PO CAPS
0.4000 mg | ORAL_CAPSULE | Freq: Every day | ORAL | Status: DC
  Administered 2018-03-11: 0.4 mg via ORAL
  Filled 2018-03-11: qty 1

## 2018-03-11 MED ORDER — SODIUM CHLORIDE 0.9 % IV BOLUS: 500.0000 mL | Freq: Once | INTRAVENOUS | Status: DC

## 2018-03-11 NOTE — ED Triage Notes (Signed)
Patient to rm 7 via EMS from home for abdominal pain.  EMS reports patient with history of abdominal pain and diagnoses of "gas".  Patient refuses to take prescribed treatment from family due to dementia.

## 2018-03-11 NOTE — ED Notes (Signed)
Patient given juice for po challenge

## 2018-03-11 NOTE — Discharge Instructions (Addendum)
You need to be eating 4 small meals a day.  I recommend you take Reglan a medication for nausea up to 3 times daily, as needed.  This should help with nausea and help with your indigestion symptoms.  Be sure to push plenty of fluids.  Your goal should be to have clear yellow urine.  Follow up with PCP.  Return to the ER for any worsening symptoms, questions or concerns.

## 2018-03-11 NOTE — ED Provider Notes (Signed)
[facility] Emergency Department Provider Note    First MD Initiated Contact with Patient 03/11/18 1911     (approximate)  I have reviewed the triage vital signs and the nursing notes.   HISTORY  Chief Complaint Abdominal Pain  Level  V Caveat:  dementia  HPI Aaron Hendrix is a 84 y.o. male sent from home with family for recurrent episode of generalized abdominal pain.  Patient with frequent visits to the ER for similar symptoms a family states this is an ongoing issue.  Has known history of gas as well as abdominal intestine dysmotility.  Patient with dementia and is frequently reluctant to take medications.  Was having Father's Day supper with family ate and then started complaining of crampy generalized abdominal pain.  No nausea or vomiting.  Does also have a history of constipation but has moved his bowels.  Denies any report of melena or hematochezia and the family keeps a close check on this.  Denies any chest pain or pressure.      Past Medical History:  Diagnosis Date  . Carpal tunnel syndrome   . Diabetes mellitus    Diet control   . DJD (degenerative joint disease), cervical   . Duodenal ulcer, with partial obstruction 08/10/2012  . Essential and other specified forms of tremor 12/16/2013  . Hyperlipidemia   . Hypertension    no medicine needed  . IBS (irritable bowel syndrome)   . Internal hemorrhoids   . Memory deficit 12/16/2013  . Numbness and tingling in right hand    started 2 yeas ago  . Peptic ulcer   . Personal history of colonic polyps    adenomatous  . Polyneuropathy in diabetes(357.2)   . Tremor, essential   . Vitamin D deficiency    Family History  Problem Relation Age of Onset  . Thyroid cancer Daughter 73  . Diabetes Son   . Urolithiasis Son   . Leukemia Maternal Aunt   . Colon cancer Neg Hx   . Esophageal cancer Neg Hx   . Rectal cancer Neg Hx   . Stomach cancer Neg Hx   . Prostate cancer Neg Hx   . Kidney  disease Neg Hx   . Kidney cancer Neg Hx   . Bladder Cancer Neg Hx    Past Surgical History:  Procedure Laterality Date  . ANAL FISSURE REPAIR    . ANKLE SURGERY Right    right- pins placed in  . COLONOSCOPY W/ BIOPSIES AND POLYPECTOMY  8/03, 6/05, 7/09, 9/10   internal hemorrhoids, tubular adenomas, mucosa & lymphoid nodules  . INGUINAL HERNIA REPAIR Right   . TOTAL HIP ARTHROPLASTY Right 11/13/2012   Procedure: TOTAL HIP ARTHROPLASTY ANTERIOR APPROACH;  Surgeon: Mcarthur Rossetti, MD;  Location: [place];  Service: Orthopedics;  Laterality: Right;  Right total hip arthroplasty  . UPPER GASTROINTESTINAL ENDOSCOPY  3/05, 7/09, 9/10,2013   gastritis, duodenitis   Patient Active Problem List   Diagnosis Date Noted  . Acute encephalopathy 01/07/2018  . Anemia 12/12/2017  . Healthcare maintenance 09/14/2017  . Respiratory failure with hypoxia and hypercapnia ([place]) 03/19/2017  . Palliative care by specialist 03/09/2017  . Hypercapnia   . Acute on chronic respiratory failure with hypercapnia ([place]) 03/08/2017  . Hypoxia 11/25/2016  . COPD exacerbation ([place]) 11/25/2016  . Rash 01/18/2016  . Nocturia 08/11/2015  . BPH with obstruction/lower urinary tract symptoms 08/11/2015  . Hematemesis 07/29/2015  . Ileus ([place]) 07/29/2015  . Lower extremity edema  07/16/2015  . Increased urinary frequency 07/16/2015  . COPD, mild ([place]) 07/16/2015  . Hyponatremia 06/25/2015  . Skin lesion 02/16/2015  . DNR (do not resuscitate) discussion 02/16/2015  . Hypothyroidism 09/27/2014  . Chronic constipation 09/01/2014  . Trigger thumb 06/22/2014  . Abdominal pain 06/22/2014  . Personal history of other specified conditions 02/20/2014  . Decreased anal sphincter tone 02/19/2014  . Other specified symptoms and signs involving the digestive system and abdomen 02/19/2014  . Diabetes mellitus ([place]) 02/19/2014  . Memory deficit 12/16/2013  . Essential and other specified forms of tremor 12/16/2013  .  Amnesia 12/16/2013  . Loss of weight 09/10/2013  . Periumbilical abdominal pain 09/10/2013  . Dermatophytosis of foot 07/01/2013  . Degenerative arthritis of hip 11/13/2012  . DU (duodenal ulcer) 08/22/2012  . Nausea 08/10/2012  . Hypertension 05/25/2011  . Diabetes mellitus, type II ([place]) 05/25/2011  . History of colonic polyps 03/31/2008  . Adaptive colitis 03/31/2008      Prior to Admission medications   Medication Sig Start Date End Date Taking? Authorizing Provider  azelastine (ASTELIN) 0.1 % nasal spray USE 1 SPRAY IN BOTH NOSTRILS TWICE DAILYAS DIRECTED. 03/05/18   [name], MD  cetirizine (ZYRTEC ALLERGY) 10 MG tablet Take 1 tablet (10 mg total) by mouth daily. 01/16/18   Eula Listen, MD  Cholecalciferol (VITAMIN D3) 50000 units CAPS Take 50,000 Units by mouth once a week.    [provider]  finasteride (PROSCAR) 5 MG tablet Take 1 tablet (5 mg total) by mouth daily. 11/22/17   Zara Council A, PA-C  fluticasone (FLONASE) 50 MCG/ACT nasal spray Place 2 sprays into both nostrils daily. 01/16/18   Eula Listen, MD  furosemide (LASIX) 40 MG tablet Take 0.5 tablets (20 mg total) by mouth daily as needed for edema. 01/08/18   Fritzi Mandes, MD  levothyroxine (SYNTHROID, LEVOTHROID) 50 MCG tablet Take 1 tablet (50 mcg total) by mouth daily before breakfast. 12/02/16   [name], MD  losartan (COZAAR) 50 MG tablet TAKE 1 TABLET BY MOUTH ONCE DAILY 01/31/18   [name], MD  magnesium oxide (MAG-OX) 400 (241.3 Mg) MG tablet TAKE 1 TABLET BY MOUTH ONCE DAILY 01/02/18   [name], MD  memantine [facility]) 5 MG tablet Take 1 tablet daily for one week, then take 1 tablet twice daily for one week, then take 1 tablet in the morning and 2 in the evening for one week, then take 2 tablets twice daily 01/29/18   Kathrynn Ducking, MD  mirtazapine (REMERON) 15 MG tablet Take 1 tablet (15 mg total) by mouth at bedtime. 01/17/18   Kathrynn Ducking, MD  mupirocin  ointment Drue Stager) 2 % Apply to affected area bid 01/19/18   [name], MD  pantoprazole (PROTONIX) 40 MG tablet TAKE 1 TABLET BY MOUTH ONCE DAILY 09/11/17   [name], MD  PROAIR HFA 108 [id number] Base) MCG/ACT inhaler INHALE 1-2 PUFFS INTO THE LUNGS EVERY 6 HOURS AS NEEDED FOR WHEEZING OR SHORTNESS OF BREATH 09/11/17   [name], MD  saccharomyces boulardii (FLORASTOR) 250 MG capsule Take 250 mg by mouth as needed.     [provider]  tamsulosin (FLOMAX) 0.4 MG CAPS capsule Take 1 capsule (0.4 mg total) by mouth daily. 11/22/17   [name], [name], PA-C  umeclidinium-vilanterol (ANORO ELLIPTA) 62.5-25 MCG/INH AEPB Inhale 1 puff into the lungs daily. 04/03/17   [name], MD    Allergies Fexofenadine; Quinapril hcl; and Latex  Social History Social History   Tobacco Use  . Smoking status: Former Smoker    Types: Cigarettes    Last attempt to quit: 07/02/1963    Years since quitting: 54.7  . Smokeless tobacco: Never Used  Substance Use Topics  . Alcohol use: No    Alcohol/week: 0.0 oz  . Drug use: No    Review of Systems Patient denies headaches, rhinorrhea, blurry vision, numbness, shortness of breath, chest pain, edema, cough, abdominal pain, nausea, vomiting, diarrhea, dysuria, fevers, rashes or hallucinations unless otherwise stated above in HPI. ____________________________________________   PHYSICAL EXAM:  VITAL SIGNS: Vitals:   03/11/18 1905  BP: (!) 105/56  Pulse: 71  Resp: 18  Temp: 98.2 F (36.8 C)  SpO2: 98%    Constitutional: Alert but chronically disoriented Eyes: Conjunctivae are normal.  Head: Atraumatic. Nose: No congestion/rhinnorhea. Mouth/Throat: Mucous membranes are moist.   Neck: No stridor. Painless ROM.  Cardiovascular: Normal rate, regular rhythm. Grossly normal heart sounds.  Good peripheral circulation. Respiratory: Normal respiratory effort.  No retractions. Lungs CTAB. Gastrointestinal: Soft and nontender.  No distention. No abdominal bruits. No CVA tenderness. Genitourinary: deferred Musculoskeletal: No lower extremity tenderness nor edema.  No joint effusions. Neurologic:  Normal speech and language. No gross focal neurologic deficits are appreciated. No facial droop Skin:  Skin is warm, dry and intact. No rash noted. Psychiatric: no agitation but with chronic disorientation and difficulty following conversation 2/2 dementia  ____________________________________________   LABS (all labs ordered are listed, but only abnormal results are displayed)  No results found for this or any previous visit (from the past 24 hour(s)). ____________________________________________  EKG My review and personal interpretation at Time: 19:10   Indication: abd pain  Rate: 70  Rhythm: sinus Axis: normal Other: normal intervals, no stemi, rbbb ____________________________________________  RADIOLOGY  I personally reviewed all radiographic images ordered to evaluate for the above acute complaints and reviewed radiology reports and findings.  These findings were personally discussed with the patient.  Please see medical record for radiology report.  ____________________________________________   PROCEDURES  Procedure(s) performed:  Procedures    Critical Care performed: no ____________________________________________   INITIAL IMPRESSION / ASSESSMENT AND PLAN / ED COURSE  Pertinent labs & imaging results that were available during my care of the patient were reviewed by me and considered in my medical decision making (see chart for details).   DDX: sbo, enteritis, gaseous distention, volvulus, electrolyte abn  Aaron Hendrix is a 84 y.o. who presents to the ED with symptoms as described above.  Patient does have underlying baseline dementia.  Is abdominal exam is nontender to palpation.  He is afebrile.  Blood work sent for the above differential does show mild CKD slightly worse than  previous.  Recommend IV fluids versus oral hydration.  After discussion with family at bedside they prefer to push fluids and reassess.  X-ray of the abdomen shows no acute ab normality or worsening from previous.  Likely secondary to his chronic and known intestinal dysmotility.  Patient was given Reglan with improvement in symptoms.  Tolerating oral hydration.  He is close follow-up with his PCP.  At this point do believe he stable and appropriate for follow-up.  Discussed signs and symptoms for which the patient should return to the ER.      As part of my medical decision making, I reviewed the following data within the [place] notes reviewed and incorporated, Labs reviewed, notes from prior ED visits and  [place] Controlled Substance Database   ____________________________________________   FINAL CLINICAL IMPRESSION(S) / ED DIAGNOSES  Final diagnoses:  Abdominal pain  Dehydration      NEW MEDICATIONS STARTED DURING THIS VISIT:  New Prescriptions   No medications on file     Note:  This document was prepared using Dragon voice recognition software and may include unintentional dictation errors.    Merlyn Lot, MD 03/11/18 2151

## 2018-03-19 ENCOUNTER — Telehealth: Payer: Self-pay | Admitting: Neurology

## 2018-03-19 MED ORDER — MIRTAZAPINE 30 MG PO TABS: 30.0000 mg | ORAL_TABLET | Freq: Every day | ORAL | 2 refills | Status: DC

## 2018-03-19 MED ORDER — ALPRAZOLAM 0.5 MG PO TABS: 0.5000 mg | ORAL_TABLET | Freq: Three times a day (TID) | ORAL | 2 refills | Status: DC | PRN

## 2018-03-19 NOTE — Telephone Encounter (Signed)
Please advise 

## 2018-03-19 NOTE — Telephone Encounter (Signed)
I called the patient, talk with the daughter.  The patient is still having increased agitation in the evenings, he is having panic about any somatic side effects or symptoms.  He is going to the emergency room frequently because he thinks he is going to die.  We will increase the Remeron to 30 mg at night, give him alprazolam to take if needed.  The family will stay in contact with our office.

## 2018-03-19 NOTE — Addendum Note (Signed)
Addended by: Kathrynn Ducking on: 03/19/2018 04:57 PM   Modules accepted: Orders

## 2018-03-19 NOTE — Telephone Encounter (Signed)
Pts daughter [name]) called stating the pt has been on memantine (NAMENDA) 5 MG tablet for about 3 weeks. [name] states the pt still has about 5-6 of uncontrollable anxiety, and feels the only place that can help with any pain or maintain the anxiety is the ED(pt has been to the ED about every other day). Requesting a call back at [phone] to discuss increasing the pts medication or switching to a different medication

## 2018-03-28 ENCOUNTER — Encounter: Payer: Self-pay | Admitting: Internal Medicine

## 2018-03-28 ENCOUNTER — Ambulatory Visit (INDEPENDENT_AMBULATORY_CARE_PROVIDER_SITE_OTHER): Payer: Medicare Other | Admitting: Internal Medicine

## 2018-03-28 DIAGNOSIS — J449 Chronic obstructive pulmonary disease, unspecified: Secondary | ICD-10-CM

## 2018-03-28 DIAGNOSIS — D649 Anemia, unspecified: Secondary | ICD-10-CM

## 2018-03-28 DIAGNOSIS — L03119 Cellulitis of unspecified part of limb: Secondary | ICD-10-CM | POA: Diagnosis not present

## 2018-03-28 DIAGNOSIS — E039 Hypothyroidism, unspecified: Secondary | ICD-10-CM | POA: Diagnosis not present

## 2018-03-28 DIAGNOSIS — I10 Essential (primary) hypertension: Secondary | ICD-10-CM

## 2018-03-28 DIAGNOSIS — R109 Unspecified abdominal pain: Secondary | ICD-10-CM | POA: Diagnosis not present

## 2018-03-28 DIAGNOSIS — E119 Type 2 diabetes mellitus without complications: Secondary | ICD-10-CM | POA: Diagnosis not present

## 2018-03-28 DIAGNOSIS — J9622 Acute and chronic respiratory failure with hypercapnia: Secondary | ICD-10-CM

## 2018-03-28 MED ORDER — CEPHALEXIN 500 MG PO CAPS: 500.0000 mg | ORAL_CAPSULE | Freq: Two times a day (BID) | ORAL | 0 refills | Status: DC

## 2018-03-28 MED ORDER — TRIAMCINOLONE ACETONIDE 0.1 % EX CREA: 1.0000 "application " | TOPICAL_CREAM | Freq: Two times a day (BID) | CUTANEOUS | 0 refills | Status: AC

## 2018-03-28 MED ORDER — MUPIROCIN 2 % EX OINT
TOPICAL_OINTMENT | CUTANEOUS | 0 refills | Status: DC
Start: 2018-03-28 — End: 2018-05-31

## 2018-03-28 NOTE — Patient Instructions (Signed)
Take a probiotic daily while you are on the antibiotics and for two weeks after completing the antibiotic.    Examples of probiotics:  Florastor, culturelle or align

## 2018-03-28 NOTE — Progress Notes (Signed)
Patient ID: TUAN TIPPIN, male   DOB: 10/20/1929, 84 y.o.   MRN: [id number]   Subjective:    Patient ID: [name], male    DOB: 02/10/1930, 84 y.o.   MRN: [id number]  HPI  Patient here for a scheduled follow up.  He is accompanied by his wife.  History obtained from both of them.  Was seen in ER 03/06/18 with abdominal pain.  CT revealed chronic fluid-filled small bowel with fluid levels (improved from most recent exam).  No evidence of obstruction or inflammation.  Was discharged and returned 03/09/18 with abdominal pain.  Xray showed no acute abnormality.  Diagnosed with intestinal dysmotility.  Was given reglan.  Symptoms improved.  Since his ER visit, he has done well. Doing better.  Feels better.  No chest pain.  Breathing stable.  No nausea or vomiting.  Bowels moving.  No abdominal pain.  Just evaluated by [facility].  On lasix.  Felt stable.  Recommended f/u in 6 months.  He is scheduled to have metabolic panel checked in 1-2 weeks.   Reports increased lesions on leg.  Has been scratching.  Increased redness now - lower extremity.     Past Medical History:  Diagnosis Date  . Carpal tunnel syndrome   . Diabetes mellitus    Diet control   . DJD (degenerative joint disease), cervical   . Duodenal ulcer, with partial obstruction 08/10/2012  . Essential and other specified forms of tremor 12/16/2013  . Hyperlipidemia   . Hypertension    no medicine needed  . IBS (irritable bowel syndrome)   . Internal hemorrhoids   . Memory deficit 12/16/2013  . Numbness and tingling in right hand    started 2 yeas ago  . Peptic ulcer   . Personal history of colonic polyps    adenomatous  . Polyneuropathy in diabetes(357.2)   . Tremor, essential   . Vitamin D deficiency    Past Surgical History:  Procedure Laterality Date  . ANAL FISSURE REPAIR    . ANKLE SURGERY Right    right- pins placed in  . COLONOSCOPY W/ BIOPSIES AND POLYPECTOMY  8/03, 6/05, 7/09, 9/10   internal  hemorrhoids, tubular adenomas, mucosa & lymphoid nodules  . INGUINAL HERNIA REPAIR Right   . TOTAL HIP ARTHROPLASTY Right 11/13/2012   Procedure: TOTAL HIP ARTHROPLASTY ANTERIOR APPROACH;  Surgeon: Mcarthur Rossetti, MD;  Location: [place];  Service: Orthopedics;  Laterality: Right;  Right total hip arthroplasty  . UPPER GASTROINTESTINAL ENDOSCOPY  3/05, 7/09, 9/10,2013   gastritis, duodenitis   Family History  Problem Relation Age of Onset  . Thyroid cancer Daughter 47  . Diabetes Son   . Urolithiasis Son   . Leukemia Maternal Aunt   . Colon cancer Neg Hx   . Esophageal cancer Neg Hx   . Rectal cancer Neg Hx   . Stomach cancer Neg Hx   . Prostate cancer Neg Hx   . Kidney disease Neg Hx   . Kidney cancer Neg Hx   . Bladder Cancer Neg Hx    Social History   Socioeconomic History  . Marital status: Married    Spouse name: Not on file  . Number of children: 3  . Years of education: MA  . Highest education level: Not on file  Occupational History  . Occupation: [employer]    CommentScientist, clinical (histocompatibility and immunogenetics)  Social Needs  . Financial resource strain: Not on file  . Food insecurity:  Worry: Not on file    Inability: Not on file  . Transportation needs:    Medical: Not on file    Non-medical: Not on file  Tobacco Use  . Smoking status: Former Smoker    Types: Cigarettes    Last attempt to quit: 07/02/1963    Years since quitting: 54.7  . Smokeless tobacco: Never Used  Substance and Sexual Activity  . Alcohol use: No    Alcohol/week: 0.0 oz  . Drug use: No  . Sexual activity: Not on file  Lifestyle  . Physical activity:    Days per week: Not on file    Minutes per session: Not on file  . Stress: Not on file  Relationships  . Social connections:    Talks on phone: Not on file    Gets together: Not on file    Attends religious service: Not on file    Active member of club or organization: Not on file    Attends meetings of clubs or organizations: Not on file     Relationship status: Not on file  Other Topics Concern  . Not on file  Social History Narrative   Lives with wife    Caffeine use: 1 cup coffee per day   Right handed    Veteran [employer]    Outpatient Encounter Medications as of 03/28/2018  Medication Sig  . ALPRAZolam (XANAX) 0.5 MG tablet Take 1 tablet (0.5 mg total) by mouth 3 (three) times daily as needed for anxiety.  Marland Kitchen azelastine (ASTELIN) 0.1 % nasal spray USE 1 SPRAY IN BOTH NOSTRILS TWICE DAILYAS DIRECTED.  Marland Kitchen cetirizine (ZYRTEC ALLERGY) 10 MG tablet Take 1 tablet (10 mg total) by mouth daily.  . Cholecalciferol (VITAMIN D3) 50000 units CAPS Take 50,000 Units by mouth once a week.  . finasteride (PROSCAR) 5 MG tablet Take 1 tablet (5 mg total) by mouth daily.  . fluticasone (FLONASE) 50 MCG/ACT nasal spray Place 2 sprays into both nostrils daily.  . furosemide (LASIX) 40 MG tablet Take 0.5 tablets (20 mg total) by mouth daily as needed for edema.  Marland Kitchen levothyroxine (SYNTHROID, LEVOTHROID) 50 MCG tablet Take 1 tablet (50 mcg total) by mouth daily before breakfast.  . losartan (COZAAR) 50 MG tablet TAKE 1 TABLET BY MOUTH ONCE DAILY  . magnesium oxide (MAG-OX) 400 (241.3 Mg) MG tablet TAKE 1 TABLET BY MOUTH ONCE DAILY  . memantine (NAMENDA) 5 MG tablet Take 1 tablet daily for one week, then take 1 tablet twice daily for one week, then take 1 tablet in the morning and 2 in the evening for one week, then take 2 tablets twice daily  . metoCLOPramide (REGLAN) 5 MG tablet Take 1 tablet (5 mg total) by mouth every 8 (eight) hours as needed for nausea.  . mirtazapine (REMERON) 30 MG tablet Take 1 tablet (30 mg total) by mouth at bedtime.  . mupirocin ointment (BACTROBAN) 2 % Apply to affected area bid  . pantoprazole (PROTONIX) 40 MG tablet TAKE 1 TABLET BY MOUTH ONCE DAILY  . PROAIR HFA 108 (90 Base) MCG/ACT inhaler INHALE 1-2 PUFFS INTO THE LUNGS EVERY 6 HOURS AS NEEDED FOR WHEEZING OR SHORTNESS OF BREATH  . saccharomyces boulardii (FLORASTOR)  250 MG capsule Take 250 mg by mouth as needed.   . tamsulosin (FLOMAX) 0.4 MG CAPS capsule Take 1 capsule (0.4 mg total) by mouth daily.  Marland Kitchen umeclidinium-vilanterol (ANORO ELLIPTA) 62.5-25 MCG/INH AEPB Inhale 1 puff into the lungs daily.  . [DISCONTINUED] mupirocin  ointment (BACTROBAN) 2 % Apply to affected area bid  . cephALEXin (KEFLEX) 500 MG capsule Take 1 capsule (500 mg total) by mouth 2 (two) times daily.  Marland Kitchen triamcinolone cream (KENALOG) 0.1 % Apply 1 application topically 2 (two) times daily.   No facility-administered encounter medications on file as of 03/28/2018.     Review of Systems  Constitutional: Negative for appetite change and unexpected weight change.  HENT: Negative for congestion and sinus pressure.   Respiratory: Negative for cough and chest tightness.        Breathing stable.    Cardiovascular: Negative for chest pain and palpitations.  Gastrointestinal: Negative for abdominal pain, diarrhea and nausea.  Genitourinary: Negative for difficulty urinating and dysuria.  Musculoskeletal: Negative for joint swelling and myalgias.  Skin: Positive for rash. Negative for color change.       Redness lower extremity.   Neurological: Negative for dizziness, light-headedness and headaches.  Psychiatric/Behavioral: Negative for agitation and dysphoric mood.       Objective:    Physical Exam  Constitutional: He appears well-developed and well-nourished. No distress.  HENT:  Nose: Nose normal.  Mouth/Throat: Oropharynx is clear and moist.  Neck: Neck supple. No thyromegaly present.  Cardiovascular: Normal rate and regular rhythm.  Pulmonary/Chest: Effort normal and breath sounds normal. No respiratory distress.  Abdominal: Soft. Bowel sounds are normal. There is no tenderness.  Musculoskeletal: He exhibits no tenderness.  Some lower extremity swelling.  Small lesions with surrounding erythema.  Also changes c/w stasis dermatitis.    Lymphadenopathy:    He has no cervical  adenopathy.  Skin: No rash noted. There is erythema (lower leg).  Psychiatric: He has a normal mood and affect. His behavior is normal.    BP 122/60 (BP Location: Left Arm, Patient Position: Sitting, Cuff Size: Normal)   Pulse 81   Temp 98.2 F (36.8 C) (Oral)   Resp 18   Wt 131 lb 3.2 oz (59.5 kg)   SpO2 96%   BMI 20.55 kg/m  Wt Readings from Last 3 Encounters:  03/28/18 131 lb 3.2 oz (59.5 kg)  03/06/18 130 lb (59 kg)  01/19/18 130 lb 3.2 oz (59.1 kg)     Lab Results  Component Value Date   WBC 7.8 03/11/2018   HGB 11.4 (L) 03/11/2018   HCT 34.6 (L) 03/11/2018   PLT 263 03/11/2018   GLUCOSE 126 (H) 03/11/2018   CHOL 177 12/12/2017   TRIG 61.0 12/12/2017   HDL 76.20 12/12/2017   LDLCALC 88 12/12/2017   ALT 12 (L) 03/11/2018   AST 24 03/11/2018   NA 133 (L) 03/11/2018   K 4.3 03/11/2018   CL 95 (L) 03/11/2018   CREATININE 1.38 (H) 03/11/2018   BUN 31 (H) 03/11/2018   CO2 30 03/11/2018   TSH 2.285 01/07/2018   PSA 0.83 02/06/2015   INR 0.94 11/06/2012   HGBA1C 6.0 12/12/2017   MICROALBUR 93.8 (H) 06/24/2015    Dg Abd 2 Views  Result Date: 03/11/2018 CLINICAL DATA:  Abdominal pain. EXAM: ABDOMEN - 2 VIEW COMPARISON:  Abdominal x-ray dated 12/17/2017. FINDINGS: Again noted is a large amount of gas throughout the large and small bowel. Associated chronic fluid-filled small bowel was described on recent CT of 03/06/2018. No evidence of free intraperitoneal air. No evidence of soft tissue mass seen. No acute or suspicious osseous finding. IMPRESSION: Large amount of gas throughout the grossly nondistended large and small bowel, similar to the appearance on previous abdomen x-ray of 12/17/2017, with  associated chronic findings described on recent CT of 03/06/2018. As described on previous reports, this suggests chronic ileus and/or dysmotility. Electronically Signed   By: Franki Cabot M.D.   On: 03/11/2018 20:18       Assessment & Plan:   Problem List Items Addressed  This Visit    Abdominal pain    Resolved.  Eating.  Bowels moving.  Follow.  May need f/u with GI if persistent problems.        Acute on chronic respiratory failure with hypercapnia (HCC)    Using BiPAP and doing better.  Follow.        Anemia    Follow cbc.       COPD, mild ([place])    Followed by Dr Alva Garnet.  Using BiPAP.  Breathing stable.         Diabetes mellitus ([place])    Follow met b and a1c.       Hypertension    Blood pressure under good control.  Continue same medication regimen.  Follow pressures.  Follow metabolic panel.        Hypothyroidism    On thyroid replacement.  Follow tsh.        Lower extremity cellulitis    Treat with keflex as directed.  bactroban to have for lesions.  Also treat stasis dermatitis with TCC.  Follow closely.            [name], MD

## 2018-03-31 ENCOUNTER — Encounter: Payer: Self-pay | Admitting: Internal Medicine

## 2018-03-31 DIAGNOSIS — L03119 Cellulitis of unspecified part of limb: Secondary | ICD-10-CM | POA: Insufficient documentation

## 2018-03-31 NOTE — Assessment & Plan Note (Signed)
Using BiPAP and doing better.  Follow.

## 2018-03-31 NOTE — Assessment & Plan Note (Signed)
Treat with keflex as directed.  bactroban to have for lesions.  Also treat stasis dermatitis with TCC.  Follow closely.

## 2018-03-31 NOTE — Assessment & Plan Note (Signed)
On thyroid replacement.  Follow tsh.  

## 2018-03-31 NOTE — Assessment & Plan Note (Signed)
Resolved.  Eating.  Bowels moving.  Follow.  May need f/u with GI if persistent problems.

## 2018-03-31 NOTE — Assessment & Plan Note (Signed)
Followed by Dr Alva Garnet.  Using BiPAP.  Breathing stable.

## 2018-03-31 NOTE — Assessment & Plan Note (Signed)
Blood pressure under good control.  Continue same medication regimen.  Follow pressures.  Follow metabolic panel.   

## 2018-03-31 NOTE — Assessment & Plan Note (Signed)
Follow met b and a1c.  

## 2018-03-31 NOTE — Assessment & Plan Note (Signed)
Follow cbc.  

## 2018-04-05 ENCOUNTER — Encounter: Payer: Self-pay | Admitting: Podiatry

## 2018-04-05 ENCOUNTER — Ambulatory Visit (INDEPENDENT_AMBULATORY_CARE_PROVIDER_SITE_OTHER): Payer: Medicare Other | Admitting: Podiatry

## 2018-04-05 DIAGNOSIS — M79609 Pain in unspecified limb: Secondary | ICD-10-CM

## 2018-04-05 DIAGNOSIS — E119 Type 2 diabetes mellitus without complications: Secondary | ICD-10-CM

## 2018-04-05 DIAGNOSIS — B351 Tinea unguium: Secondary | ICD-10-CM

## 2018-04-05 NOTE — Progress Notes (Signed)
Complaint:  Visit Type: Patient returns to my office for continued preventative foot care services. Complaint: Patient states" my nails have grown long and thick and become painful to walk and wear shoes" . The patient presents for preventative foot care services. No changes to ROS  Podiatric Exam: Vascular: dorsalis pedis and posterior tibial pulses are palpable bilateral. Capillary return is immediate. Temperature gradient is WNL. Skin turgor WNL  Sensorium: Normal Semmes Weinstein monofilament test. Normal tactile sensation bilaterally. Nail Exam: Pt has thick disfigured discolored nails with subungual debris noted bilateral entire nail hallux through fifth toenails Ulcer Exam: There is no evidence of ulcer or pre-ulcerative changes or infection. Orthopedic Exam: Muscle tone and strength are WNL. No limitations in general ROM. No crepitus or effusions noted. Foot type and digits show no abnormalities. Bony prominences are unremarkable. Skin: No Porokeratosis. No infection or ulcers  Diagnosis:  Onychomycosis, , Pain in right toe, pain in left toes  Treatment & Plan Procedures and Treatment: Consent by patient was obtained for treatment procedures. The patient understood the discussion of treatment and procedures well. All questions were answered thoroughly reviewed. Debridement of mycotic and hypertrophic toenails, 1 through 5 bilateral and clearing of subungual debris. No ulceration, no infection noted.  Return Visit-Office Procedure: Patient instructed to return to the office for a follow up visit 4 months   for continued evaluation and treatment.    Gardiner Barefoot DPM

## 2018-04-12 ENCOUNTER — Other Ambulatory Visit: Payer: Self-pay | Admitting: Neurology

## 2018-04-12 MED ORDER — MEMANTINE HCL 5 MG PO TABS: ORAL_TABLET | ORAL | 0 refills | Status: DC

## 2018-04-12 NOTE — Telephone Encounter (Signed)
Pt's daughter Paula/DPR [phone] (pharmacist at [facility]) request refill for memantine (NAMENDA) 5 MG tablet 1 tab 2 x day. He is not taking 10mg  twice daily yet, she is titrating up slowly. She requested the refill from [place] yesterday, she is aware it does not look like a request was rec'd at the clinic. Please call her to advise when the medication refill has been sent to [facility].

## 2018-04-12 NOTE — Telephone Encounter (Signed)
E-scribed rx to pharmacy.

## 2018-04-12 NOTE — Telephone Encounter (Signed)
Called daughter back and relayed rx called in. She will call next week with update of how he is doing on medication.

## 2018-04-21 ENCOUNTER — Other Ambulatory Visit: Payer: Self-pay | Admitting: Internal Medicine

## 2018-04-25 ENCOUNTER — Other Ambulatory Visit: Payer: Self-pay | Admitting: Pulmonary Disease

## 2018-04-25 ENCOUNTER — Other Ambulatory Visit: Payer: Self-pay | Admitting: Internal Medicine

## 2018-05-09 ENCOUNTER — Other Ambulatory Visit: Payer: Self-pay | Admitting: Neurology

## 2018-05-09 ENCOUNTER — Other Ambulatory Visit: Payer: Self-pay | Admitting: Internal Medicine

## 2018-05-10 ENCOUNTER — Telehealth: Payer: Self-pay | Admitting: Neurology

## 2018-05-10 MED ORDER — MEMANTINE HCL 5 MG PO TABS: 5.0000 mg | ORAL_TABLET | Freq: Two times a day (BID) | ORAL | 1 refills | Status: DC

## 2018-05-10 NOTE — Telephone Encounter (Signed)
I spoke to daughter, [name], pharmacist.  Pt is doing really well on namenda 5mg  po bid. She would like for him to stay on that dose at this time.  Placed 90 day supply. Pt has appt 07-10-18 with Dr. Jannifer Franklin.  She was appreciative.

## 2018-05-10 NOTE — Addendum Note (Signed)
Addended by: Brandon Melnick on: 05/10/2018 01:33 PM   Modules accepted: Orders

## 2018-05-10 NOTE — Telephone Encounter (Signed)
Pt's daughter Paula/DPR said he is going well on memantine (NAMENDA) 5 MG tablet 2 x day. She said a rx was sent to [facility] yesterday for 10mg  and she is not wanting to increase it at this this time. Please call to discuss at [phone]

## 2018-05-17 NOTE — Telephone Encounter (Signed)
error 

## 2018-05-31 ENCOUNTER — Ambulatory Visit (INDEPENDENT_AMBULATORY_CARE_PROVIDER_SITE_OTHER): Payer: Medicare Other | Admitting: Urology

## 2018-05-31 ENCOUNTER — Encounter: Payer: Self-pay | Admitting: Urology

## 2018-05-31 VITALS — BP 159/78 | HR 76 | Resp 15 | Ht 63.0 in | Wt 129.8 lb

## 2018-05-31 DIAGNOSIS — N138 Other obstructive and reflux uropathy: Secondary | ICD-10-CM

## 2018-05-31 DIAGNOSIS — R351 Nocturia: Secondary | ICD-10-CM | POA: Diagnosis not present

## 2018-05-31 DIAGNOSIS — N401 Enlarged prostate with lower urinary tract symptoms: Secondary | ICD-10-CM

## 2018-05-31 DIAGNOSIS — N393 Stress incontinence (female) (male): Secondary | ICD-10-CM | POA: Diagnosis not present

## 2018-05-31 MED ORDER — FINASTERIDE 5 MG PO TABS: 5.0000 mg | ORAL_TABLET | Freq: Every day | ORAL | 3 refills | Status: AC

## 2018-05-31 NOTE — Progress Notes (Signed)
3:26 PM   Aaron Hendrix Jun 13, 1930 [id number]  Referring provider: [name], [address] [place], [place] [address]  Chief Complaint  Patient presents with  . Follow-up    HPI: Patient is an 84 year old  African American male who presents today for a six months follow up.  Previous history Patient was seen during a recent admission for hyponatremia with nocturia and was started on tamsulosin for his moderate PVR's of 200 cc.  He was experiencing urinary frequency, urgency, nocturia, leakage of urine, intermittency, hesitancy, straining to urinate and a weak stream.  He has been diagnosed with sleep apnea and is sleeping with his BiPAP machine.  A median lobe of the prostate was appreciated on a CT scan in 05/2014.  BPH WITH LUTS His IPSS score today is 10, which is moderate lower urinary tract symptomatology.  He is pleased with his quality life due to his urinary symptoms.  His previous IPSS score was 15/1.   His previous PVR was 110 mL.  He has no complaints today.  He denies any dysuria, hematuria or suprapubic pain.  He currently taking tamsulosin 0.4 mg and finasteride 5 mg daily.   He also denies any recent fevers, chills, nausea or vomiting.  He does not have a family history of PCa.   IPSS    Row Name 05/31/18 1500         International Prostate Symptom Score   How often have you had the sensation of not emptying your bladder?  Less than 1 in 5     How often have you had to urinate less than every two hours?  Less than half the time     How often have you found you stopped and started again several times when you urinated?  Less than 1 in 5 times     How often have you found it difficult to postpone urination?  About half the time     How often have you had a weak urinary stream?  Not at All     How often have you had to strain to start urination?  Less than 1 in 5 times     How many times did you typically get up at night to urinate?   2 Times     Total IPSS Score  10       Quality of Life due to urinary symptoms   If you were to spend the rest of your life with your urinary condition just the way it is now how would you feel about that?  Pleased        Score:  1-7 Mild 8-19 Moderate 20-35 Severe     PMH: Past Medical History:  Diagnosis Date  . Carpal tunnel syndrome   . Diabetes mellitus    Diet control   . DJD (degenerative joint disease), cervical   . Duodenal ulcer, with partial obstruction 08/10/2012  . Essential and other specified forms of tremor 12/16/2013  . Hyperlipidemia   . Hypertension    no medicine needed  . IBS (irritable bowel syndrome)   . Internal hemorrhoids   . Memory deficit 12/16/2013  . Numbness and tingling in right hand    started 2 yeas ago  . Peptic ulcer   . Personal history of colonic polyps    adenomatous  . Polyneuropathy in diabetes(357.2)   . Tremor, essential   . Vitamin D deficiency     Surgical History: Past Surgical  History:  Procedure Laterality Date  . ANAL FISSURE REPAIR    . ANKLE SURGERY Right    right- pins placed in  . COLONOSCOPY W/ BIOPSIES AND POLYPECTOMY  8/03, 6/05, 7/09, 9/10   internal hemorrhoids, tubular adenomas, mucosa & lymphoid nodules  . INGUINAL HERNIA REPAIR Right   . TOTAL HIP ARTHROPLASTY Right 11/13/2012   Procedure: TOTAL HIP ARTHROPLASTY ANTERIOR APPROACH;  Surgeon: Mcarthur Rossetti, MD;  Location: [facility];  Service: Orthopedics;  Laterality: Right;  Right total hip arthroplasty  . UPPER GASTROINTESTINAL ENDOSCOPY  3/05, 7/09, 9/10,2013   gastritis, duodenitis    Home Medications:  Allergies as of 05/31/2018      Reactions   Fexofenadine Other (See Comments)   Quinapril Hcl Other (See Comments)   Unknown    Latex Rash      Medication List        Accurate as of 05/31/18  3:26 PM. Always use your most recent med list.          albuterol 108 (90 Base) MCG/ACT inhaler Commonly known as:  PROVENTIL HFA;VENTOLIN  HFA Inhale 1 puff into the lungs every 6 (six) hours as needed for wheezing or shortness of breath.   ALPRAZolam 0.5 MG tablet Commonly known as:  XANAX TAKE 1 TABLET BY MOUTH 3 TIMES DAILY AS NEEDED ANXIETY   ANORO ELLIPTA 62.5-25 MCG/INH Aepb Generic drug:  umeclidinium-vilanterol USE 1 PUFF INTO LUNGS  ONCE DAILY AS DIRECTED   azelastine 0.1 % nasal spray Commonly known as:  ASTELIN USE 1 SPRAY IN BOTH NOSTRILS TWICE DAILYAS DIRECTED.   cetirizine 10 MG tablet Commonly known as:  ZYRTEC Take 1 tablet (10 mg total) by mouth daily.   finasteride 5 MG tablet Commonly known as:  PROSCAR Take 1 tablet (5 mg total) by mouth daily.   fluticasone 50 MCG/ACT nasal spray Commonly known as:  FLONASE Place 2 sprays into both nostrils daily.   furosemide 40 MG tablet Commonly known as:  LASIX Take 0.5 tablets (20 mg total) by mouth daily as needed for edema.   levothyroxine 50 MCG tablet Commonly known as:  SYNTHROID, LEVOTHROID Take 1 tablet (50 mcg total) by mouth daily before breakfast.   losartan 50 MG tablet Commonly known as:  COZAAR TAKE 1 TABLET BY MOUTH ONCE DAILY   magnesium oxide 400 (241.3 Mg) MG tablet Commonly known as:  MAG-OX TAKE 1 TABLET BY MOUTH ONCE DAILY   memantine 5 MG tablet Commonly known as:  NAMENDA Take 1 tablet (5 mg total) by mouth 2 (two) times daily.   metoCLOPramide 5 MG tablet Commonly known as:  REGLAN Take 1 tablet (5 mg total) by mouth every 8 (eight) hours as needed for nausea.   mirtazapine 30 MG tablet Commonly known as:  REMERON Take 1 tablet (30 mg total) by mouth at bedtime.   pantoprazole 40 MG tablet Commonly known as:  PROTONIX TAKE 1 TABLET BY MOUTH ONCE DAILY   saccharomyces boulardii 250 MG capsule Commonly known as:  FLORASTOR Take 250 mg by mouth as needed.   tamsulosin 0.4 MG Caps capsule Commonly known as:  FLOMAX Take 1 capsule (0.4 mg total) by mouth daily.   triamcinolone cream 0.1 % Commonly known as:   KENALOG Apply 1 application topically 2 (two) times daily.   Vitamin D3 50000 units Caps Take 50,000 Units by mouth once a week.       Allergies:  Allergies  Allergen Reactions  . Fexofenadine Other (See Comments)  .  Quinapril Hcl Other (See Comments)    Unknown   . Latex Rash    Family History: Family History  Problem Relation Age of Onset  . Thyroid cancer Daughter 57  . Diabetes Son   . Urolithiasis Son   . Leukemia Maternal Aunt   . Colon cancer Neg Hx   . Esophageal cancer Neg Hx   . Rectal cancer Neg Hx   . Stomach cancer Neg Hx   . Prostate cancer Neg Hx   . Kidney disease Neg Hx   . Kidney cancer Neg Hx   . Bladder Cancer Neg Hx     Social History:  reports that he quit smoking about 54 years ago. His smoking use included cigarettes. He has never used smokeless tobacco. He reports that he does not drink alcohol or use drugs.  ROS: UROLOGY Frequent Urination?: No Hard to postpone urination?: No Burning/pain with urination?: No Get up at night to urinate?: No Leakage of urine?: No Urine stream starts and stops?: No Trouble starting stream?: No Do you have to strain to urinate?: No Blood in urine?: No Urinary tract infection?: No Sexually transmitted disease?: No Injury to kidneys or bladder?: No Painful intercourse?: No Weak stream?: No Erection problems?: No Penile pain?: No  Gastrointestinal Nausea?: No Vomiting?: No Indigestion/heartburn?: No Diarrhea?: No Constipation?: No  Constitutional Fever: No Night sweats?: No Weight loss?: Yes Fatigue?: No  Skin Skin rash/lesions?: No Itching?: No  Eyes Blurred vision?: No Double vision?: No  Ears/Nose/Throat Sore throat?: No Sinus problems?: No  Hematologic/Lymphatic Swollen glands?: No Easy bruising?: No  Cardiovascular Leg swelling?: No Chest pain?: No  Respiratory Cough?: No Shortness of breath?: No  Endocrine Excessive thirst?: No  Musculoskeletal Back pain?:  No Joint pain?: No  Neurological Headaches?: No Dizziness?: No  Psychologic Depression?: No Anxiety?: Yes  Physical Exam: BP (!) 159/78   Pulse 76   Resp 15   Ht 5\' 3"  (1.6 m)   Wt 129 lb 12.8 oz (58.9 kg)   BMI 22.99 kg/m   Constitutional: Well nourished. Alert and oriented, No acute distress. HEENT: [place] AT, moist mucus membranes. Trachea midline, no masses. Cardiovascular: No clubbing, cyanosis, or edema. Respiratory: Normal respiratory effort, no increased work of breathing. GI: Abdomen is soft, non tender, non distended, no abdominal masses. Liver and spleen not palpable.  No hernias appreciated.  Stool sample for occult testing is not indicated.   GU: No CVA tenderness.  No bladder fullness or masses.  Patient with uncircumcised phallus. Foreskin easily retracted   Urethral meatus is patent.  No penile discharge. No penile lesions or rashes. Scrotum without lesions, cysts, rashes and/or edema.  Testicles are located scrotally bilaterally. No masses are appreciated in the testicles. Left and right epididymis are normal. Rectal: Patient with  normal sphincter tone. Anus and perineum without scarring or rashes. No rectal masses are appreciated. Prostate is approximately 50 grams, could only palpate the apex and midportion, no nodules are appreciated. Seminal vesicles are normal. Skin: No rashes, bruises or suspicious lesions. Lymph: No cervical or inguinal adenopathy. Neurologic: Grossly intact, no focal deficits, moving all 4 extremities. Psychiatric: Normal mood and affect.    Laboratory Data: Lab Results  Component Value Date   WBC 7.8 03/11/2018   HGB 11.4 (L) 03/11/2018   HCT 34.6 (L) 03/11/2018   MCV 95.7 03/11/2018   PLT 263 03/11/2018   Lab Results  Component Value Date   CREATININE 1.38 (H) 03/11/2018   Lab Results  Component Value  Date   PSA 0.83 02/06/2015   Lab Results  Component Value Date   HGBA1C 6.0 12/12/2017   I have reviewed the  labs.  Pertinent Imaging Results for orders placed or performed during the hospital encounter of 03/11/18  Lipase, blood  Result Value Ref Range   Lipase 38 11 - 51 U/L  Comprehensive metabolic panel  Result Value Ref Range   Sodium 133 (L) 135 - 145 mmol/L   Potassium 4.3 3.5 - 5.1 mmol/L   Chloride 95 (L) 101 - 111 mmol/L   CO2 30 22 - 32 mmol/L   Glucose, Bld 126 (H) 65 - 99 mg/dL   BUN 31 (H) 6 - 20 mg/dL   Creatinine, Ser 1.38 (H) 0.61 - 1.24 mg/dL   Calcium 8.7 (L) 8.9 - 10.3 mg/dL   Total Protein 6.5 6.5 - 8.1 g/dL   Albumin 3.7 3.5 - 5.0 g/dL   AST 24 15 - 41 U/L   ALT 12 (L) 17 - 63 U/L   Alkaline Phosphatase 68 38 - 126 U/L   Total Bilirubin 0.3 0.3 - 1.2 mg/dL   GFR calc non Af Amer 45 (L) >60 mL/min   GFR calc Af Amer 52 (L) >60 mL/min   Anion gap 8 5 - 15  CBC  Result Value Ref Range   WBC 7.8 3.8 - 10.6 K/uL   RBC 3.62 (L) 4.40 - 5.90 MIL/uL   Hemoglobin 11.4 (L) 13.0 - 18.0 g/dL   HCT 34.6 (L) 40.0 - 52.0 %   MCV 95.7 80.0 - 100.0 fL   MCH 31.6 26.0 - 34.0 pg   MCHC 33.1 32.0 - 36.0 g/dL   RDW 13.2 11.5 - 14.5 %   Platelets 263 150 - 440 K/uL   Assessment & Plan:    1. Nocturia:   He is still sleeping with BiPAP machine. He'll return in 12 months for I PSS.  2. BPH with LUTS IPSS score is 10/1, it is improving  Continue conservative management, avoiding bladder irritants and timed voiding's Continue finasteride 5 mg daily and tamsulosin 0.4 mg -patient will discontinue the tamsulosin once he runs out of medication RTC in 12 months for IPSS and exam   3. SUI Still not bothersome to the patient at this time  Return in about 1 year (around 06/01/2019) for I PSS and exam .  Zara Council, [facility]  [place] [address] [place],  [address] [phone]

## 2018-06-04 ENCOUNTER — Other Ambulatory Visit: Payer: Self-pay | Admitting: Neurology

## 2018-06-05 ENCOUNTER — Encounter: Payer: Self-pay | Admitting: Pulmonary Disease

## 2018-06-05 ENCOUNTER — Ambulatory Visit (INDEPENDENT_AMBULATORY_CARE_PROVIDER_SITE_OTHER): Payer: Medicare Other | Admitting: Pulmonary Disease

## 2018-06-05 ENCOUNTER — Other Ambulatory Visit: Payer: Self-pay | Admitting: Neurology

## 2018-06-05 VITALS — BP 140/76 | HR 93 | Ht 63.0 in | Wt 127.0 lb

## 2018-06-05 DIAGNOSIS — G4733 Obstructive sleep apnea (adult) (pediatric): Secondary | ICD-10-CM | POA: Diagnosis not present

## 2018-06-05 DIAGNOSIS — J9612 Chronic respiratory failure with hypercapnia: Secondary | ICD-10-CM | POA: Diagnosis not present

## 2018-06-05 DIAGNOSIS — J449 Chronic obstructive pulmonary disease, unspecified: Secondary | ICD-10-CM

## 2018-06-05 MED ORDER — ALBUTEROL SULFATE HFA 108 (90 BASE) MCG/ACT IN AERS: 1.0000 | INHALATION_SPRAY | Freq: Four times a day (QID) | RESPIRATORY_TRACT | 5 refills | Status: AC | PRN

## 2018-06-05 MED ORDER — UMECLIDINIUM-VILANTEROL 62.5-25 MCG/INH IN AEPB: 1.0000 | INHALATION_SPRAY | Freq: Every day | RESPIRATORY_TRACT | 5 refills | Status: AC

## 2018-06-05 NOTE — Patient Instructions (Addendum)
Continue Anoro inhaler Continue albuterol inhaler as needed Continue BiPAP with sleep  Recommend DuoDERM to be placed on the bridge of his nose  when wearing BiPAP mask  Follow-up in 1 year.  Call sooner as needed

## 2018-06-05 NOTE — Progress Notes (Signed)
PROFILE: Hospitalized 9/29 - 07/06/15 initially with AMS and hyponatremia. Developed progressive hypersomnolence and was found to be profoundly hypercarbic (PaCO2 162 torr!!). Underwent intubation and mechanical ventilation 9/30 - 07/01/15. It was felt that the hypercarbic resp failure was due to COPD and he was discharged home on bronchodilator therapy with pulmonary follow up. On his initial CXR, there was concern for a RUL nodule which was confirmed by CT chest. Again hospitalized 10/20-10/26/16 for weakness and hyponatremia.   PROBLEMS: COPD - mild obstruction at baseline Chronic hypercarbic respiratory failure Moderate OSA. Treated with CPAP   DATA: CT chest 07/20/15: RUL "nodule" smaller than previously noted in Sept PET 07/22/15: No specific findings identified to suggest hypermetabolic/FDG avid tumor. Continued resolution of right upper lobe pulmonary nodule which currently measures 1.3 cm and exhibits non malignant range FDG uptake Spirometry 07/30/15: FVC  2.09 73%,  FEV1 1.47 68%, FEV1% 70%   PSG 07/27/15: Moderate OSA (AHI 29) LE venous US 08/02/15: no DVT Hospitalized 03/02-03/04/18 for acute on chronic hypercarbic respiratory failure with hypoxemia. Discharge diagnosis was COPD exacerbation. CPAP compliance 05/08-06/06/18: Usage 29/30 days, > 4 hours 8 days, < 4 hours 21 days Sleep study 04/14/17: Bilevel trial - recommended 10/5 with backup rate of 8 and 2 LPM O2 bleed in  INTERVAL HISTORY: Last seen 01/09/2018.  No major events  SUBJ: This is a scheduled follow-up.  The last 5 months have been uneventful.  He comes with his wife who provides most of the history due to the patient's advanced dementia.  He is now wearing BiPAP compliantly.  His wife's only concern is that he has a pressure ulceration on the bridge of his nose.  Otherwise, she is very pleased with his overall well-being.  He has already received a flu vaccination this year.  OBJ: Vitals:   06/05/18 1128  06/05/18 1134  BP:  140/76  Pulse:  93  SpO2:  96%  Weight: 127 lb (57.6 kg)   Height: 5\' 3"  (1.6 m)   RA  No distress HEENT: Small, superficial pressure ulceration on the bridge of his nose Neck: Supple without adenopathy or jugular venous distention Chest: Mildly diminished breath sounds without wheezes or other adventitious sounds Cardiac: RRR, no M Abdomen: Soft, NT, NABS Extremities: No C/C/E Neuro: Somewhat flat affect, no focal deficits  Outpatient Encounter Medications as of 06/05/2018  Medication Sig  . albuterol (PROVENTIL HFA;VENTOLIN HFA) 108 (90 Base) MCG/ACT inhaler Inhale 1 puff into the lungs every 6 (six) hours as needed for wheezing or shortness of breath.  . ALPRAZolam (XANAX) 0.5 MG tablet TAKE 1 TABLET BY MOUTH 3 TIMES DAILY AS NEEDED ANXIETY  . ANORO ELLIPTA 62.5-25 MCG/INH AEPB USE 1 PUFF INTO LUNGS  ONCE DAILY AS DIRECTED  . azelastine (ASTELIN) 0.1 % nasal spray USE 1 SPRAY IN BOTH NOSTRILS TWICE DAILYAS DIRECTED.  Marland Kitchen cetirizine (ZYRTEC ALLERGY) 10 MG tablet Take 1 tablet (10 mg total) by mouth daily.  . Cholecalciferol (VITAMIN D3) 50000 units CAPS Take 50,000 Units by mouth once a week.  . finasteride (PROSCAR) 5 MG tablet Take 1 tablet (5 mg total) by mouth daily.  . fluticasone (FLONASE) 50 MCG/ACT nasal spray Place 2 sprays into both nostrils daily.  . furosemide (LASIX) 40 MG tablet Take 0.5 tablets (20 mg total) by mouth daily as needed for edema.  Marland Kitchen levothyroxine (SYNTHROID, LEVOTHROID) 50 MCG tablet Take 1 tablet (50 mcg total) by mouth daily before breakfast.  . losartan (COZAAR) 50 MG tablet TAKE 1 TABLET  BY MOUTH ONCE DAILY  . magnesium oxide (MAG-OX) 400 (241.3 Mg) MG tablet TAKE 1 TABLET BY MOUTH ONCE DAILY  . memantine (NAMENDA) 5 MG tablet Take 1 tablet (5 mg total) by mouth 2 (two) times daily.  . metoCLOPramide (REGLAN) 5 MG tablet Take 1 tablet (5 mg total) by mouth every 8 (eight) hours as needed for nausea.  . mirtazapine (REMERON) 30 MG  tablet Take 1 tablet (30 mg total) by mouth at bedtime.  . pantoprazole (PROTONIX) 40 MG tablet TAKE 1 TABLET BY MOUTH ONCE DAILY  . saccharomyces boulardii (FLORASTOR) 250 MG capsule Take 250 mg by mouth as needed.   . tamsulosin (FLOMAX) 0.4 MG CAPS capsule Take 1 capsule (0.4 mg total) by mouth daily.  Marland Kitchen triamcinolone cream (KENALOG) 0.1 % Apply 1 application topically 2 (two) times daily.   No facility-administered encounter medications on file as of 06/05/2018.    DATA: BMP Latest Ref Rng & Units 03/11/2018 03/06/2018 01/19/2018  Glucose 65 - 99 mg/dL 126(H) 84 105(H)  BUN 6 - 20 mg/dL 31(H) 34(H) 15  Creatinine 0.61 - 1.24 mg/dL 1.38(H) 1.03 1.07  Sodium 135 - 145 mmol/L 133(L) 134(L) 130(L)  Potassium 3.5 - 5.1 mmol/L 4.3 4.2 5.1  Chloride 101 - 111 mmol/L 95(L) 98(L) 91(L)  CO2 22 - 32 mmol/L 30 28 28   Calcium 8.9 - 10.3 mg/dL 8.7(L) 8.3(L) 9.2    CBC Latest Ref Rng & Units 03/11/2018 03/06/2018 01/16/2018  WBC 3.8 - 10.6 K/uL 7.8 9.2 7.4  Hemoglobin 13.0 - 18.0 g/dL 11.4(L) 11.4(L) 11.2(L)  Hematocrit 40.0 - 52.0 % 34.6(L) 34.1(L) 33.5(L)  Platelets 150 - 440 K/uL 263 285 254   CXR: No new film  IMPRESSION: COPD, mild (HCC)  Obstructive sleep apnea  Chronic respiratory failure with hypercapnia.  Appears to have resolved with treatment of OSA    PLAN: Continue Anoro inhaler - one inhalation daily  continue albuterol inhaler as needed Continue BiPAP with sleep I recommended DuoDERM to be used on the ulceration on the bridge of his nose when wearing BiPAP mask  Follow-up in 1 year.  Call sooner if needed   Merton Border, MD PCCM service Mobile [phone] Pager [phone] 06/05/2018 11:46 AM

## 2018-06-06 ENCOUNTER — Other Ambulatory Visit: Payer: Self-pay | Admitting: Neurology

## 2018-06-26 ENCOUNTER — Other Ambulatory Visit: Payer: Self-pay | Admitting: Neurology

## 2018-07-02 ENCOUNTER — Ambulatory Visit (INDEPENDENT_AMBULATORY_CARE_PROVIDER_SITE_OTHER): Payer: Medicare Other | Admitting: Podiatry

## 2018-07-02 ENCOUNTER — Encounter: Payer: Self-pay | Admitting: Podiatry

## 2018-07-02 DIAGNOSIS — E119 Type 2 diabetes mellitus without complications: Secondary | ICD-10-CM | POA: Diagnosis not present

## 2018-07-02 DIAGNOSIS — M79609 Pain in unspecified limb: Secondary | ICD-10-CM | POA: Diagnosis not present

## 2018-07-02 DIAGNOSIS — B351 Tinea unguium: Secondary | ICD-10-CM

## 2018-07-02 NOTE — Progress Notes (Addendum)
Complaint:  Visit Type: Patient returns to my office for continued preventative foot care services. Complaint: Patient states" my nails have grown long and thick and become painful to walk and wear shoes" . The patient presents for preventative foot care services. No changes to ROS  Podiatric Exam: Vascular: dorsalis pedis and posterior tibial pulses are palpable bilateral. Capillary return is immediate. Temperature gradient is WNL. Skin turgor WNL  Sensorium: Normal Semmes Weinstein monofilament test. Normal tactile sensation bilaterally. Nail Exam: Pt has thick disfigured discolored nails with subungual debris noted bilateral entire nail hallux through fifth toenails Ulcer Exam: There is no evidence of ulcer or pre-ulcerative changes or infection. Orthopedic Exam: Muscle tone and strength are WNL. No limitations in general ROM. No crepitus or effusions noted. Foot type and digits show no abnormalities. Bony prominences are unremarkable. Skin: No Porokeratosis. No infection or ulcers  Diagnosis:  Onychomycosis, , Pain in right toe, pain in left toes  Treatment & Plan Procedures and Treatment: Consent by patient was obtained for treatment procedures. The patient understood the discussion of treatment and procedures well. All questions were answered thoroughly reviewed. Debridement of mycotic and hypertrophic toenails, 1 through 5 bilateral and clearing of subungual debris. No ulceration, no infection noted.  Return Visit-Office Procedure: Patient instructed to return to the office for a follow up visit 3 months   for continued evaluation and treatment.    Gardiner Barefoot DPM

## 2018-07-04 ENCOUNTER — Telehealth: Payer: Self-pay | Admitting: Internal Medicine

## 2018-07-04 NOTE — Telephone Encounter (Signed)
I can see him during lunch on Thursday if there is coverage.  Work in for this.  He also sees pulmonary (if unable to come in at this time.  They may be able to work him in as well).

## 2018-07-04 NOTE — Telephone Encounter (Signed)
I spoke with wife & offered her the 8:15 appt with Dr Aundra Dubin, she states that is too early. I also mentioned urgent care/walk-in clinic. She wanted me to see if you think you could work him in anywhere.  Please advise

## 2018-07-04 NOTE — Telephone Encounter (Signed)
° °  Copied from [place]. Topic: Appointment Scheduling - Scheduling Inquiry for Clinic >> Jul 04, 2018  9:04 AM Aaron Hendrix wrote: Reason for CRM: Pts wife, Aaron Hendrix, calling to request Dr. Nicki Reaper work pt in-tomorrow if possible. Aaron Hendrix states pt has been experiencing a productive cough with a lot of mucus and thinks that he needs to be seen sooner than what is available. Pt will not see another provider-as wife says pt has dementia and they are both more comfortable with Dr. Nicki Reaper.

## 2018-07-05 ENCOUNTER — Ambulatory Visit (INDEPENDENT_AMBULATORY_CARE_PROVIDER_SITE_OTHER): Payer: Medicare Other | Admitting: Internal Medicine

## 2018-07-05 ENCOUNTER — Encounter: Payer: Self-pay | Admitting: Internal Medicine

## 2018-07-05 ENCOUNTER — Ambulatory Visit
Admission: RE | Admit: 2018-07-05 | Discharge: 2018-07-05 | Disposition: A | Discharge status: Home / Self Care | Payer: Medicare Other | Source: Ambulatory Visit | Attending: Internal Medicine | Admitting: Internal Medicine

## 2018-07-05 VITALS — BP 148/66 | HR 90 | Temp 98.1°F | Resp 16 | Ht 63.0 in | Wt 129.4 lb

## 2018-07-05 DIAGNOSIS — J9622 Acute and chronic respiratory failure with hypercapnia: Secondary | ICD-10-CM | POA: Diagnosis not present

## 2018-07-05 DIAGNOSIS — R053 Chronic cough: Secondary | ICD-10-CM

## 2018-07-05 DIAGNOSIS — J449 Chronic obstructive pulmonary disease, unspecified: Secondary | ICD-10-CM | POA: Diagnosis not present

## 2018-07-05 DIAGNOSIS — E039 Hypothyroidism, unspecified: Secondary | ICD-10-CM

## 2018-07-05 DIAGNOSIS — E871 Hypo-osmolality and hyponatremia: Secondary | ICD-10-CM

## 2018-07-05 DIAGNOSIS — R05 Cough: Secondary | ICD-10-CM | POA: Diagnosis not present

## 2018-07-05 DIAGNOSIS — J9811 Atelectasis: Secondary | ICD-10-CM | POA: Diagnosis not present

## 2018-07-05 DIAGNOSIS — D649 Anemia, unspecified: Secondary | ICD-10-CM

## 2018-07-05 DIAGNOSIS — E119 Type 2 diabetes mellitus without complications: Secondary | ICD-10-CM

## 2018-07-05 DIAGNOSIS — I10 Essential (primary) hypertension: Secondary | ICD-10-CM

## 2018-07-05 DIAGNOSIS — J069 Acute upper respiratory infection, unspecified: Secondary | ICD-10-CM

## 2018-07-05 MED ORDER — AZITHROMYCIN 250 MG PO TABS: ORAL_TABLET | ORAL | 0 refills | Status: DC

## 2018-07-05 NOTE — Patient Instructions (Signed)
Examples of probiotics:  Culturelle, align or florastor.    nasacort nasal spray - 2 sprays each nostril one time per day.  Do this in the evening.    Saline nasal spray - flush nose at lease 2-3x/day  Can continue mucinex.

## 2018-07-05 NOTE — Progress Notes (Signed)
[name], male   DOB: [birth year], 84 y.o.   MRN: [id number]   Subjective:    [name], male    DOB: [birth year], 84 y.o.   MRN: [id number]  HPI  Patient here as a work in with concerns regarding increased cough and congestion.  He is accompanied by his wife.  History obtained from both of them.  Reports increased congestion over the past week.  Thick mucus.  Some increased nasal congestion.  Question of drainage.  No fever.  He is eating.  No vomiting.  Does have some increased thick mucus that will "make him gag".  Some intermittent confusion.  This is his baseline.  Takes xanax prn.  Has f/u with neurology next week.  Overall wife feels this is stable.  Has been taking sudafed and mucinex.  Also drinking hot tea and soup.  No abdominal pain.  Bowels stable.     Past Medical History:  Diagnosis Date  . Carpal tunnel syndrome   . Diabetes mellitus    Diet control   . DJD (degenerative joint disease), cervical   . Duodenal ulcer, with partial obstruction 08/10/2012  . Essential and other specified forms of tremor 12/16/2013  . Hyperlipidemia   . Hypertension    no medicine needed  . IBS (irritable bowel syndrome)   . Internal hemorrhoids   . Memory deficit 12/16/2013  . Numbness and tingling in right hand    started 2 yeas ago  . Peptic ulcer   . Personal history of colonic polyps    adenomatous  . Polyneuropathy in diabetes(357.2)   . Tremor, essential   . Vitamin D deficiency    Past Surgical History:  Procedure Laterality Date  . ANAL FISSURE REPAIR    . ANKLE SURGERY Right    right- pins placed in  . COLONOSCOPY W/ BIOPSIES AND POLYPECTOMY  8/03, 6/05, 7/09, 9/10   internal hemorrhoids, tubular adenomas, mucosa & lymphoid nodules  . INGUINAL HERNIA REPAIR Right   . TOTAL HIP ARTHROPLASTY Right 11/13/2012   Procedure: TOTAL HIP ARTHROPLASTY ANTERIOR APPROACH;  Surgeon: Mcarthur Rossetti, MD;  Location: [facility];  Service:  Orthopedics;  Laterality: Right;  Right total hip arthroplasty  . UPPER GASTROINTESTINAL ENDOSCOPY  3/05, 7/09, 9/10,2013   gastritis, duodenitis   Family History  Problem Relation Age of Onset  . Thyroid cancer Daughter 68  . Diabetes Son   . Urolithiasis Son   . Leukemia Maternal Aunt   . Colon cancer Neg Hx   . Esophageal cancer Neg Hx   . Rectal cancer Neg Hx   . Stomach cancer Neg Hx   . Prostate cancer Neg Hx   . Kidney disease Neg Hx   . Kidney cancer Neg Hx   . Bladder Cancer Neg Hx    Social History   Socioeconomic History  . Marital status: Married    Spouse name: Not on file  . Number of children: 3  . Years of education: MA  . Highest education level: Not on file  Occupational History  . Occupation: [employer]    CommentScientist, clinical (histocompatibility and immunogenetics)  Social Needs  . Financial resource strain: Not on file  . Food insecurity:    Worry: Not on file    Inability: Not on file  . Transportation needs:    Medical: Not on file    Non-medical: Not on file  Tobacco Use  . Smoking status: Former Smoker    Types: Cigarettes  Last attempt to quit: 07/02/1963    Years since quitting: 55.0  . Smokeless tobacco: Never Used  Substance and Sexual Activity  . Alcohol use: No    Alcohol/week: 0.0 standard drinks  . Drug use: No  . Sexual activity: Not on file  Lifestyle  . Physical activity:    Days per week: Not on file    Minutes per session: Not on file  . Stress: Not on file  Relationships  . Social connections:    Talks on phone: Not on file    Gets together: Not on file    Attends religious service: Not on file    Active member of club or organization: Not on file    Attends meetings of clubs or organizations: Not on file    Relationship status: Not on file  Other Topics Concern  . Not on file  Social History Narrative   Lives with wife    Caffeine use: 1 cup coffee per day   Right handed    V[employer]    Outpatient Encounter Medications as of  07/05/2018  Medication Sig  . albuterol (PROVENTIL HFA;VENTOLIN HFA) 108 (90 Base) MCG/ACT inhaler Inhale 1 puff into the lungs every 6 (six) hours as needed for wheezing or shortness of breath.  . ALPRAZolam (XANAX) 0.5 MG tablet TAKE 1 TABLET BY MOUTH 3 TIMES DAILY AS NEEDED ANXIETY  . azelastine (ASTELIN) 0.1 % nasal spray USE 1 SPRAY IN BOTH NOSTRILS TWICE DAILYAS DIRECTED.  Marland Kitchen cetirizine (ZYRTEC ALLERGY) 10 MG tablet Take 1 tablet (10 mg total) by mouth daily.  . Cholecalciferol (VITAMIN D3) 50000 units CAPS Take 50,000 Units by mouth once a week.  . finasteride (PROSCAR) 5 MG tablet Take 1 tablet (5 mg total) by mouth daily.  . fluticasone (FLONASE) 50 MCG/ACT nasal spray Place 2 sprays into both nostrils daily.  . furosemide (LASIX) 40 MG tablet Take 0.5 tablets (20 mg total) by mouth daily as needed for edema.  Marland Kitchen levothyroxine (SYNTHROID, LEVOTHROID) 50 MCG tablet Take 1 tablet (50 mcg total) by mouth daily before breakfast.  . losartan (COZAAR) 50 MG tablet TAKE 1 TABLET BY MOUTH ONCE DAILY  . magnesium oxide (MAG-OX) 400 (241.3 Mg) MG tablet TAKE 1 TABLET BY MOUTH ONCE DAILY  . memantine (NAMENDA) 5 MG tablet Take 1 tablet (5 mg total) by mouth 2 (two) times daily.  . metoCLOPramide (REGLAN) 5 MG tablet Take 1 tablet (5 mg total) by mouth every 8 (eight) hours as needed for nausea.  . mirtazapine (REMERON) 30 MG tablet TAKE 1 TABLET BY MOUTH AT BEDTIME  . pantoprazole (PROTONIX) 40 MG tablet TAKE 1 TABLET BY MOUTH ONCE DAILY  . saccharomyces boulardii (FLORASTOR) 250 MG capsule Take 250 mg by mouth as needed.   . tamsulosin (FLOMAX) 0.4 MG CAPS capsule Take 1 capsule (0.4 mg total) by mouth daily.  Marland Kitchen triamcinolone cream (KENALOG) 0.1 % Apply 1 application topically 2 (two) times daily.  Marland Kitchen umeclidinium-vilanterol (ANORO ELLIPTA) 62.5-25 MCG/INH AEPB Inhale 1 puff into the lungs daily.  Marland Kitchen azithromycin (ZITHROMAX) 250 MG tablet Take 2 tablets x 1 day and then one tablet per day for four  more days.   No facility-administered encounter medications on file as of 07/05/2018.     Review of Systems  Constitutional: Negative for appetite change and fever.  HENT: Positive for congestion and postnasal drip.   Respiratory: Positive for cough. Negative for chest tightness and shortness of breath.  Chest congestion.    Cardiovascular: Negative for chest pain and palpitations.  Gastrointestinal: Negative for abdominal pain, diarrhea and nausea.  Genitourinary: Negative for difficulty urinating and dysuria.  Musculoskeletal: Negative for joint swelling and myalgias.  Skin: Negative for color change and rash.  Neurological: Negative for dizziness and headaches.       Intermittent episode of confusion as outlined.         Objective:    Physical Exam  Constitutional: He appears well-developed and well-nourished. No distress.  HENT:  Nares - slightly erythematous turbinates.  No tenderness to palpation over the sinuses.    Neck: Neck supple.  Cardiovascular: Normal rate and regular rhythm.  Pulmonary/Chest: Effort normal. No respiratory distress.  Some increased cough.  No wheezing.   Abdominal: Soft. Bowel sounds are normal. There is no tenderness.  Musculoskeletal: He exhibits no tenderness.  Lower extremity swelling - stable.   Lymphadenopathy:    He has no cervical adenopathy.  Skin: No rash noted. No erythema.  Psychiatric: He has a normal mood and affect. His behavior is normal.    BP (!) 148/66 (BP Location: Left Arm, Patient Position: Sitting, Cuff Size: Normal)   Pulse 90   Temp 98.1 F (36.7 C) (Oral)   Resp 16   Ht 5' 3"  (1.6 m)   Wt 129 lb 6.4 oz (58.7 kg)   SpO2 96%   BMI 22.92 kg/m  Wt Readings from Last 3 Encounters:  07/05/18 129 lb 6.4 oz (58.7 kg)  06/05/18 127 lb (57.6 kg)  05/31/18 129 lb 12.8 oz (58.9 kg)     Lab Results  Component Value Date   WBC 7.8 03/11/2018   HGB 11.4 (L) 03/11/2018   HCT 34.6 (L) 03/11/2018   PLT 263  03/11/2018   GLUCOSE 126 (H) 03/11/2018   CHOL 177 12/12/2017   TRIG 61.0 12/12/2017   HDL 76.20 12/12/2017   LDLCALC 88 12/12/2017   ALT 12 (L) 03/11/2018   AST 24 03/11/2018   NA 133 (L) 03/11/2018   K 4.3 03/11/2018   CL 95 (L) 03/11/2018   CREATININE 1.38 (H) 03/11/2018   BUN 31 (H) 03/11/2018   CO2 30 03/11/2018   TSH 2.285 01/07/2018   PSA 0.83 02/06/2015   INR 0.94 11/06/2012   HGBA1C 6.0 12/12/2017   MICROALBUR 93.8 (H) 06/24/2015    Dg Abd 2 Views  Result Date: 03/11/2018 CLINICAL DATA:  Abdominal pain. EXAM: ABDOMEN - 2 VIEW COMPARISON:  Abdominal x-ray dated 12/17/2017. FINDINGS: Again noted is a large amount of gas throughout the large and small bowel. Associated chronic fluid-filled small bowel was described on recent CT of 03/06/2018. No evidence of free intraperitoneal air. No evidence of soft tissue mass seen. No acute or suspicious osseous finding. IMPRESSION: Large amount of gas throughout the grossly nondistended large and small bowel, similar to the appearance on previous abdomen x-ray of 12/17/2017, with associated chronic findings described on recent CT of 03/06/2018. As described on previous reports, this suggests chronic ileus and/or dysmotility. Electronically Signed   By: Franki Cabot M.D.   On: 03/11/2018 20:18       Assessment & Plan:   Problem List Items Addressed This Visit    Acute on chronic respiratory failure with hypercapnia (HCC)    Uses BiPAP.  Treat congestion, etc.  Discussed with wife importance of using regularly.        Anemia    Follow cbc.       COPD, mild ([place])  Followed by Dr Alva Garnet.  Using BiPAP.  Treat current congestion and infection.        Relevant Medications   azithromycin (ZITHROMAX) 250 MG tablet   Diabetes mellitus ([employer])    Follow met b and a1c.       Hypertension    Blood pressure has been under good control.  Slight elevation today.  Follow pressures.  Follow metabolic panel.  Sees nephrology.        Hyponatremia    Recent sodium check wnl.       Hypothyroidism    On thyroid replacement.  Follow tsh.        Other Visit Diagnoses    Persistent cough    -  Primary   Relevant Orders   DG Chest 2 View (Completed)   Upper respiratory tract infection, unspecified type       Increased cough and congestion.  Exam as outlined.  Treat with Zpak as directed.  Mucinex and nasacort as directed.  Check cxr.  Hold prednisone.     Relevant Medications   azithromycin (ZITHROMAX) 250 MG tablet       [name], MD

## 2018-07-08 ENCOUNTER — Other Ambulatory Visit: Payer: Self-pay | Admitting: Internal Medicine

## 2018-07-08 ENCOUNTER — Encounter: Payer: Self-pay | Admitting: Internal Medicine

## 2018-07-08 NOTE — Assessment & Plan Note (Signed)
Uses BiPAP.  Treat congestion, etc.  Discussed with wife importance of using regularly.

## 2018-07-08 NOTE — Assessment & Plan Note (Signed)
Recent sodium check wnl.

## 2018-07-08 NOTE — Assessment & Plan Note (Signed)
On thyroid replacement.  Follow tsh.  

## 2018-07-08 NOTE — Assessment & Plan Note (Signed)
Follow met b and a1c.

## 2018-07-08 NOTE — Assessment & Plan Note (Signed)
Follow cbc.  

## 2018-07-08 NOTE — Assessment & Plan Note (Addendum)
Blood pressure has been under good control.  Slight elevation today.  Follow pressures.  Follow metabolic panel.  Sees nephrology.

## 2018-07-08 NOTE — Assessment & Plan Note (Signed)
Followed by Dr Alva Garnet.  Using BiPAP.  Treat current congestion and infection.

## 2018-07-09 ENCOUNTER — Other Ambulatory Visit: Payer: Self-pay | Admitting: Internal Medicine

## 2018-07-09 NOTE — Telephone Encounter (Signed)
Copied from [place] [id number]. Topic: General - Other >> Jul 09, 2018 11:24 AM Yvette Rack wrote: Reason for CRM: Pt wife states a refill of Mutirocin ointment 2%  is needed for the spots on pt leg. She requests that the Rx be sent to [facility], [place]

## 2018-07-09 NOTE — Telephone Encounter (Signed)
Copied from [place] [id number]. Topic: Quick Communication - Rx Refill/Question >> Jul 09, 2018 11:20 AM Yvette Rack wrote: Medication: furosemide (LASIX) 40 MG tablet  Has the patient contacted their pharmacy? no  Preferred Pharmacy (with phone number or street name): [facility], [place]. [phone] (Phone) [phone] (Fax)  Agent: Please be advised that RX refills may take up to 3 business days. We ask that you follow-up with your pharmacy.

## 2018-07-10 ENCOUNTER — Ambulatory Visit: Payer: Medicare Other | Admitting: Neurology

## 2018-07-10 MED ORDER — FUROSEMIDE 40 MG PO TABS: 20.0000 mg | ORAL_TABLET | Freq: Every day | ORAL | 0 refills | Status: AC | PRN

## 2018-07-10 NOTE — Telephone Encounter (Signed)
Contacted pt's wife, Herbert Pun, regarding refill request for mutirocin 2% ointment; this is not on the pt's medication list; she says that this request can be disregarded.  Requested Prescriptions  Pending Prescriptions Disp Refills  . furosemide (LASIX) 40 MG tablet 45 tablet 0    Sig: Take 0.5 tablets (20 mg total) by mouth daily as needed for edema.     There is no refill protocol information for this order

## 2018-07-17 ENCOUNTER — Telehealth: Payer: Self-pay | Admitting: Neurology

## 2018-07-17 MED ORDER — ALPRAZOLAM 0.5 MG PO TABS: ORAL_TABLET | ORAL | 3 refills | Status: AC

## 2018-07-17 MED ORDER — MIRTAZAPINE 45 MG PO TABS: 45.0000 mg | ORAL_TABLET | Freq: Every day | ORAL | 3 refills | Status: AC

## 2018-07-17 NOTE — Telephone Encounter (Signed)
I called the daughter, The patient is having ongoing aggitation fueled by OCD tendencies., we will go up on the Remeron to 45 mg at night and use the alprazolam on a scheduled basis, three times a day.

## 2018-07-17 NOTE — Telephone Encounter (Signed)
Pt daughter((daughter on DPR) Delphia Grates) has called to inform Dr Jannifer Franklin that pt has worsen, she is wanting a call to discuss medications and to also request a refill on  ALPRAZolam (XANAX) 0.5 MG tablet.  [place], [place] [phone] (Phone) [phone] (Fax)   Daughter wants to discuss the idea of increasing mirtazapine (REMERON) 30 MG tablet. Please call

## 2018-07-17 NOTE — Addendum Note (Signed)
Addended by: Kathrynn Ducking on: 07/17/2018 04:46 PM   Modules accepted: Orders

## 2018-07-18 ENCOUNTER — Telehealth: Payer: Self-pay | Admitting: *Deleted

## 2018-07-18 MED ORDER — MEMANTINE HCL 10 MG PO TABS: 10.0000 mg | ORAL_TABLET | Freq: Two times a day (BID) | ORAL | 0 refills | Status: AC

## 2018-07-18 NOTE — Telephone Encounter (Signed)
Namenda escribed to [facility] in response to faxed request from them/fim

## 2018-07-19 ENCOUNTER — Other Ambulatory Visit: Payer: Self-pay | Admitting: Neurology

## 2018-07-20 ENCOUNTER — Telehealth: Payer: Self-pay | Admitting: Internal Medicine

## 2018-07-20 NOTE — Telephone Encounter (Signed)
Finally they have taken patient to UC, due to daughter feels he may need ABX.

## 2018-07-20 NOTE — Telephone Encounter (Signed)
Patient wife stated he has fell and has a couple of skin tears are at the thumb and finger joint 1 inch and 1 at elbow joint , second arm has 1 about about an inch the daughter that is a pharmacist says he feels warm but has no fever by thermometer she is requesting antibiotic. They have been cleaning with sterile saline and using non  Stick telfa , 2 of the area still have a slow bleed.

## 2018-07-20 NOTE — Telephone Encounter (Signed)
Per daughter she is requesting ABX because he feels warm she says the skin tears are slightly red around outer edge denies fever. Requesting ABX because he has 4 tears and the severity it is the weekend and do not want to take him to ED or UC.

## 2018-07-20 NOTE — Telephone Encounter (Signed)
As we discussed, I am not opposed to giving him abx if they are needed, but it does not appear to be needed at this point.  I recommend bactroban ointment topically and keep the areas clean as they are doing.  My concern about sending in abx ( if not needed) - would be possible complications from abx (i.e., c.diff, etc).  Monitor closely and if any questions, concerns or worsening - will need to be seen.

## 2018-07-20 NOTE — Telephone Encounter (Signed)
Copied from [place] [id number]. Topic: Quick Communication - See Telephone Encounter >> Jul 20, 2018 12:20 PM Bea Graff, NT wrote: CRM for notification. See Telephone encounter for: 07/20/18. Pts wife calling to request an antibiotic for her husband. She states he has some skin lesions and a fever. Did give wife option of an appointment, but she stated she would like to speak with the nurse to see if this can be done rather than coming in. Please advise.

## 2018-07-20 NOTE — Telephone Encounter (Signed)
Confirm no fever.  Is he eating, drinking, etc?  Why do they think he needs abx?  Are the skin tears red, etc?  If more as a preventative measure, can send in bactroban ointment with directions to apply to affected areas bid.  If any acute issues, needs to be evaluated.

## 2018-07-27 ENCOUNTER — Other Ambulatory Visit: Payer: Self-pay | Admitting: Internal Medicine

## 2018-07-30 ENCOUNTER — Ambulatory Visit (INDEPENDENT_AMBULATORY_CARE_PROVIDER_SITE_OTHER): Payer: Medicare Other | Admitting: Internal Medicine

## 2018-07-30 ENCOUNTER — Encounter: Payer: Self-pay | Admitting: Internal Medicine

## 2018-07-30 VITALS — BP 138/64 | HR 92 | Temp 98.1°F | Resp 18 | Wt 130.0 lb

## 2018-07-30 DIAGNOSIS — I10 Essential (primary) hypertension: Secondary | ICD-10-CM | POA: Diagnosis not present

## 2018-07-30 DIAGNOSIS — D649 Anemia, unspecified: Secondary | ICD-10-CM | POA: Diagnosis not present

## 2018-07-30 DIAGNOSIS — J449 Chronic obstructive pulmonary disease, unspecified: Secondary | ICD-10-CM

## 2018-07-30 DIAGNOSIS — E039 Hypothyroidism, unspecified: Secondary | ICD-10-CM

## 2018-07-30 DIAGNOSIS — T148XXA Other injury of unspecified body region, initial encounter: Secondary | ICD-10-CM

## 2018-07-30 DIAGNOSIS — N138 Other obstructive and reflux uropathy: Secondary | ICD-10-CM

## 2018-07-30 DIAGNOSIS — E871 Hypo-osmolality and hyponatremia: Secondary | ICD-10-CM

## 2018-07-30 DIAGNOSIS — N401 Enlarged prostate with lower urinary tract symptoms: Secondary | ICD-10-CM

## 2018-07-30 DIAGNOSIS — R600 Localized edema: Secondary | ICD-10-CM

## 2018-07-30 DIAGNOSIS — E119 Type 2 diabetes mellitus without complications: Secondary | ICD-10-CM | POA: Diagnosis not present

## 2018-07-30 LAB — CBC WITH DIFFERENTIAL/PLATELET
BASOS ABS: 0 10*3/uL (ref 0.0–0.1)
BASOS PCT: 0.6 % (ref 0.0–3.0)
EOS ABS: 0.2 10*3/uL (ref 0.0–0.7)
Eosinophils Relative: 2.5 % (ref 0.0–5.0)
HEMATOCRIT: 36.5 % — AB (ref 39.0–52.0)
Hemoglobin: 11.5 g/dL — ABNORMAL LOW (ref 13.0–17.0)
LYMPHS PCT: 14.3 % (ref 12.0–46.0)
Lymphs Abs: 1.2 10*3/uL (ref 0.7–4.0)
MCHC: 31.4 g/dL (ref 30.0–36.0)
MCV: 98.3 fl (ref 78.0–100.0)
Monocytes Absolute: 0.8 10*3/uL (ref 0.1–1.0)
Monocytes Relative: 9.1 % (ref 3.0–12.0)
Neutro Abs: 6.4 10*3/uL (ref 1.4–7.7)
Neutrophils Relative %: 73.5 % (ref 43.0–77.0)
Platelets: 344 10*3/uL (ref 150.0–400.0)
RBC: 3.71 Mil/uL — AB (ref 4.22–5.81)
RDW: 14.2 % (ref 11.5–15.5)
WBC: 8.6 10*3/uL (ref 4.0–10.5)

## 2018-07-30 LAB — BASIC METABOLIC PANEL
BUN: 21 mg/dL (ref 6–23)
CALCIUM: 9.1 mg/dL (ref 8.4–10.5)
CO2: 35 mEq/L — ABNORMAL HIGH (ref 19–32)
Chloride: 98 mEq/L (ref 96–112)
Creatinine, Ser: 1.45 mg/dL (ref 0.40–1.50)
GFR: 59.36 mL/min — AB (ref >60.00)
Glucose, Bld: 129 mg/dL — ABNORMAL HIGH (ref 70–99)
POTASSIUM: 5.2 meq/L — AB (ref 3.5–5.1)
SODIUM: 140 meq/L (ref 135–145)

## 2018-07-30 LAB — MAGNESIUM: MAGNESIUM: 2.1 mg/dL (ref 1.5–2.5)

## 2018-07-30 LAB — HEPATIC FUNCTION PANEL
ALBUMIN: 3.7 g/dL (ref 3.5–5.2)
ALT: 7 U/L (ref 0–53)
AST: 14 U/L (ref 0–37)
Alkaline Phosphatase: 168 U/L — ABNORMAL HIGH (ref 39–117)
Bilirubin, Direct: 0.1 mg/dL (ref 0.0–0.3)
TOTAL PROTEIN: 6.3 g/dL (ref 6.0–8.3)
Total Bilirubin: 0.3 mg/dL (ref 0.2–1.2)

## 2018-07-30 LAB — FERRITIN: Ferritin: 31.2 ng/mL (ref 22.0–322.0)

## 2018-07-30 LAB — TSH: TSH: 1.38 u[IU]/mL (ref 0.35–4.50)

## 2018-07-30 LAB — HEMOGLOBIN A1C: Hgb A1c MFr Bld: 5.9 % (ref 4.6–6.5)

## 2018-07-30 NOTE — Progress Notes (Signed)
[name], male   DOB: 03/16/1930, 84 y.o.   MRN: [id number]   Subjective:    [name], male    DOB: 10/16/1929, 84 y.o.   MRN: [id number]  HPI  Patient here for a scheduled follow up.  He is accompanied by his wife and daughter.  History obtained from all of them.  He is doing much better.  Dr Jannifer Franklin just recently increased his remeron and namenda.  Taking xanax tid prn.  Doing better on this regimen.  Eating.  Some increased gas.  Discussed probiotics.  Taking Gas X.  Skin abrasions.  Healing.  No chest pain.  Breathing stable.  Using BiPap.  Continues on Anoro.     Past Medical History:  Diagnosis Date  . Carpal tunnel syndrome   . Diabetes mellitus    Diet control   . DJD (degenerative joint disease), cervical   . Duodenal ulcer, with partial obstruction 08/10/2012  . Essential and other specified forms of tremor 12/16/2013  . Hyperlipidemia   . Hypertension    no medicine needed  . IBS (irritable bowel syndrome)   . Internal hemorrhoids   . Memory deficit 12/16/2013  . Numbness and tingling in right hand    started 2 yeas ago  . Peptic ulcer   . Personal history of colonic polyps    adenomatous  . Polyneuropathy in diabetes(357.2)   . Tremor, essential   . Vitamin D deficiency    Past Surgical History:  Procedure Laterality Date  . ANAL FISSURE REPAIR    . ANKLE SURGERY Right    right- pins placed in  . COLONOSCOPY W/ BIOPSIES AND POLYPECTOMY  8/03, 6/05, 7/09, 9/10   internal hemorrhoids, tubular adenomas, mucosa & lymphoid nodules  . INGUINAL HERNIA REPAIR Right   . TOTAL HIP ARTHROPLASTY Right 11/13/2012   Procedure: TOTAL HIP ARTHROPLASTY ANTERIOR APPROACH;  Surgeon: Mcarthur Rossetti, MD;  Location: [facility];  Service: Orthopedics;  Laterality: Right;  Right total hip arthroplasty  . UPPER GASTROINTESTINAL ENDOSCOPY  3/05, 7/09, 9/10,2013   gastritis, duodenitis   Family History  Problem Relation Age of Onset  . Thyroid  cancer Daughter 70  . Diabetes Son   . Urolithiasis Son   . Leukemia Maternal Aunt   . Colon cancer Neg Hx   . Esophageal cancer Neg Hx   . Rectal cancer Neg Hx   . Stomach cancer Neg Hx   . Prostate cancer Neg Hx   . Kidney disease Neg Hx   . Kidney cancer Neg Hx   . Bladder Cancer Neg Hx    Social History   Socioeconomic History  . Marital status: Married    Spouse name: Not on file  . Number of children: 3  . Years of education: MA  . Highest education level: Not on file  Occupational History  . Occupation: [employer]    CommentScientist, clinical (histocompatibility and immunogenetics)  Social Needs  . Financial resource strain: Not on file  . Food insecurity:    Worry: Not on file    Inability: Not on file  . Transportation needs:    Medical: Not on file    Non-medical: Not on file  Tobacco Use  . Smoking status: Former Smoker    Types: Cigarettes    Last attempt to quit: 07/02/1963    Years since quitting: 55.1  . Smokeless tobacco: Never Used  Substance and Sexual Activity  . Alcohol use: No    Alcohol/week:  0.0 standard drinks  . Drug use: No  . Sexual activity: Not on file  Lifestyle  . Physical activity:    Days per week: Not on file    Minutes per session: Not on file  . Stress: Not on file  Relationships  . Social connections:    Talks on phone: Not on file    Gets together: Not on file    Attends religious service: Not on file    Active member of club or organization: Not on file    Attends meetings of clubs or organizations: Not on file    Relationship status: Not on file  Other Topics Concern  . Not on file  Social History Narrative   Lives with wife    Caffeine use: 1 cup coffee per day   Right handed    V[employer]an [employer]    Outpatient Encounter Medications as of 07/30/2018  Medication Sig  . albuterol (PROVENTIL HFA;VENTOLIN HFA) 108 (90 Base) MCG/ACT inhaler Inhale 1 puff into the lungs every 6 (six) hours as needed for wheezing or shortness of breath.  . ALPRAZolam (XANAX)  0.5 MG tablet TAKE 1 TABLET BY MOUTH 3 TIMES DAILY AS NEEDED ANXIETY  . Azelastine HCl 137 MCG/SPRAY SOLN USE 1 SPRAY IN BOTH NOSTRILS TWICE DAILYAS DIRECTED  . cetirizine (ZYRTEC ALLERGY) 10 MG tablet Take 1 tablet (10 mg total) by mouth daily.  . Cholecalciferol (VITAMIN D3) 50000 units CAPS Take 50,000 Units by mouth once a week.  . finasteride (PROSCAR) 5 MG tablet Take 1 tablet (5 mg total) by mouth daily.  . fluticasone (FLONASE) 50 MCG/ACT nasal spray Place 2 sprays into both nostrils daily.  . furosemide (LASIX) 40 MG tablet Take 0.5 tablets (20 mg total) by mouth daily as needed for edema.  Marland Kitchen levothyroxine (SYNTHROID, LEVOTHROID) 50 MCG tablet Take 1 tablet (50 mcg total) by mouth daily before breakfast.  . losartan (COZAAR) 50 MG tablet TAKE 1 TABLET BY MOUTH ONCE DAILY  . magnesium oxide (MAG-OX) 400 (241.3 Mg) MG tablet TAKE 1 TABLET BY MOUTH ONCE DAILY  . memantine (NAMENDA) 10 MG tablet Take 1 tablet (10 mg total) by mouth 2 (two) times daily.  . metoCLOPramide (REGLAN) 5 MG tablet Take 1 tablet (5 mg total) by mouth every 8 (eight) hours as needed for nausea.  . mirtazapine (REMERON) 45 MG tablet Take 1 tablet (45 mg total) by mouth at bedtime.  . pantoprazole (PROTONIX) 40 MG tablet TAKE 1 TABLET BY MOUTH ONCE DAILY  . saccharomyces boulardii (FLORASTOR) 250 MG capsule Take 250 mg by mouth as needed.   . tamsulosin (FLOMAX) 0.4 MG CAPS capsule Take 1 capsule (0.4 mg total) by mouth daily.  Marland Kitchen triamcinolone cream (KENALOG) 0.1 % Apply 1 application topically 2 (two) times daily.  Marland Kitchen umeclidinium-vilanterol (ANORO ELLIPTA) 62.5-25 MCG/INH AEPB Inhale 1 puff into the lungs daily.  . [DISCONTINUED] azithromycin (ZITHROMAX) 250 MG tablet Take 2 tablets x 1 day and then one tablet per day for four more days.   No facility-administered encounter medications on file as of 07/30/2018.     Review of Systems  Constitutional: Negative for appetite change and unexpected weight change.  HENT:  Negative for congestion and sinus pressure.   Respiratory: Negative for cough and chest tightness.        Breathing stable.    Cardiovascular: Negative for chest pain and palpitations.  Gastrointestinal: Negative for abdominal pain, diarrhea, nausea and vomiting.       Increased gas.  Genitourinary: Negative for difficulty urinating and dysuria.  Musculoskeletal: Negative for joint swelling and myalgias.  Skin: Negative for color change and rash.  Neurological: Negative for dizziness, light-headedness and headaches.  Psychiatric/Behavioral: Negative for agitation and dysphoric mood.       Objective:    Physical Exam  Constitutional: He appears well-developed and well-nourished. No distress.  HENT:  Nose: Nose normal.  Mouth/Throat: Oropharynx is clear and moist.  Neck: Neck supple.  Cardiovascular: Normal rate and regular rhythm.  Pulmonary/Chest: Effort normal and breath sounds normal. No respiratory distress.  Abdominal: Soft. Bowel sounds are normal. There is no tenderness.  Musculoskeletal: He exhibits no edema or tenderness.  Lymphadenopathy:    He has no cervical adenopathy.  Skin: No rash noted. No erythema.  Psychiatric: He has a normal mood and affect. His behavior is normal.    BP 138/64 (BP Location: Left Arm, Patient Position: Sitting, Cuff Size: Normal)   Pulse 92   Temp 98.1 F (36.7 C) (Oral)   Resp 18   Wt 130 lb (59 kg)   SpO2 98%   BMI 23.03 kg/m  Wt Readings from Last 3 Encounters:  07/30/18 130 lb (59 kg)  07/05/18 129 lb 6.4 oz (58.7 kg)  06/05/18 127 lb (57.6 kg)     Lab Results  Component Value Date   WBC 8.6 07/30/2018   HGB 11.5 (L) 07/30/2018   HCT 36.5 (L) 07/30/2018   PLT 344.0 07/30/2018   GLUCOSE 128 (H) 08/02/2018   CHOL 177 12/12/2017   TRIG 61.0 12/12/2017   HDL 76.20 12/12/2017   LDLCALC 88 12/12/2017   ALT 7 07/30/2018   AST 14 07/30/2018   NA 135 08/02/2018   K 4.3 08/02/2018   CL 95 (L) 08/02/2018   CREATININE 1.39  08/02/2018   BUN 19 08/02/2018   CO2 36 (H) 08/02/2018   TSH 1.38 07/30/2018   PSA 0.83 02/06/2015   INR 0.94 11/06/2012   HGBA1C 5.9 07/30/2018   MICROALBUR 93.8 (H) 06/24/2015    Dg Chest 2 View  Result Date: 07/06/2018 CLINICAL DATA:  Cough, congestion EXAM: CHEST - 2 VIEW COMPARISON:  01/16/2018 FINDINGS: Heart is normal size. Minimal right base atelectasis. No effusions. No acute bony abnormality. Mild gaseous distention of bowel in the visualized upper abdomen. IMPRESSION: Right base atelectasis. Electronically Signed   By: Rolm Baptise M.D.   On: 07/06/2018 08:10       Assessment & Plan:   Problem List Items Addressed This Visit    Anemia - Primary    Follow cbc.       Relevant Orders   CBC with Differential/Platelet (Completed)   Ferritin (Completed)   BPH with obstruction/lower urinary tract symptoms    Followed by urology.       COPD, mild ([place])    Followed by Dr Alva Garnet.  Using BiPAP.  Breathing stable.        Diabetes mellitus, type II ([place])   Relevant Orders   Hemoglobin A1c (Completed)   Basic metabolic panel (Completed)   Hypertension    Blood pressure under good control.  Continue same medication regimen.  Follow pressures.  Follow metabolic panel.        Relevant Orders   Hepatic function panel (Completed)   Magnesium (Completed)   Hyponatremia    Recent sodium level wnl.  Follow.        Hypothyroidism    On thyroid replacement.  Follow tsh.       Relevant Orders  TSH (Completed)   Lower extremity edema    Improved.         Other Visit Diagnoses    Skin abrasion       Multiple skin abrasions.  Abx ointment.  Dressed. Follow.        [name], MD

## 2018-08-01 ENCOUNTER — Telehealth: Payer: Self-pay | Admitting: Radiology

## 2018-08-01 DIAGNOSIS — E875 Hyperkalemia: Secondary | ICD-10-CM

## 2018-08-01 DIAGNOSIS — R944 Abnormal results of kidney function studies: Secondary | ICD-10-CM

## 2018-08-01 NOTE — Telephone Encounter (Signed)
Order placed for lab

## 2018-08-01 NOTE — Telephone Encounter (Signed)
Pt coming in for labs tomorrow, please place future orders. Thank you.  

## 2018-08-02 ENCOUNTER — Other Ambulatory Visit (INDEPENDENT_AMBULATORY_CARE_PROVIDER_SITE_OTHER): Payer: Medicare Other

## 2018-08-02 DIAGNOSIS — E875 Hyperkalemia: Secondary | ICD-10-CM | POA: Diagnosis not present

## 2018-08-02 DIAGNOSIS — R944 Abnormal results of kidney function studies: Secondary | ICD-10-CM | POA: Diagnosis not present

## 2018-08-02 LAB — BASIC METABOLIC PANEL
BUN: 19 mg/dL (ref 6–23)
CHLORIDE: 95 meq/L — AB (ref 96–112)
CO2: 36 mEq/L — ABNORMAL HIGH (ref 19–32)
Calcium: 8.6 mg/dL (ref 8.4–10.5)
Creatinine, Ser: 1.39 mg/dL (ref 0.40–1.50)
GFR: 62.32 mL/min (ref >60.00)
Glucose, Bld: 128 mg/dL — ABNORMAL HIGH (ref 70–99)
POTASSIUM: 4.3 meq/L (ref 3.5–5.1)
SODIUM: 135 meq/L (ref 135–145)

## 2018-08-04 ENCOUNTER — Encounter: Payer: Self-pay | Admitting: Internal Medicine

## 2018-08-04 NOTE — Assessment & Plan Note (Signed)
Follow met b and a1c.  

## 2018-08-04 NOTE — Assessment & Plan Note (Signed)
Improved

## 2018-08-04 NOTE — Assessment & Plan Note (Signed)
Followed by Dr Alva Garnet.  Using BiPAP.  Breathing stable.

## 2018-08-04 NOTE — Assessment & Plan Note (Signed)
Followed by urology.   

## 2018-08-04 NOTE — Assessment & Plan Note (Signed)
Recent sodium level wnl.  Follow.

## 2018-08-04 NOTE — Assessment & Plan Note (Signed)
Follow cbc.  

## 2018-08-04 NOTE — Assessment & Plan Note (Signed)
On thyroid replacement.  Follow tsh.  

## 2018-08-04 NOTE — Assessment & Plan Note (Signed)
Blood pressure under good control.  Continue same medication regimen.  Follow pressures.  Follow metabolic panel.   

## 2018-08-06 ENCOUNTER — Ambulatory Visit: Payer: Medicare Other | Admitting: Podiatry

## 2018-08-15 ENCOUNTER — Telehealth: Payer: Self-pay | Admitting: Internal Medicine

## 2018-08-15 NOTE — Telephone Encounter (Signed)
-----   Message from [name], MD sent at 08/25/2018 10:33 AM EST ----- This is your patient he died last night found by his wife in recliner EMS/police tried to do CPR but failed time of death was 4:08 am   They would like for you to fill out the death certificate when able  They think natural causes related to COPD, dementia   Thanks [place]

## 2018-08-17 ENCOUNTER — Telehealth: Payer: Self-pay

## 2018-08-17 NOTE — Telephone Encounter (Signed)
Erroneous encounter

## 2018-08-17 NOTE — Telephone Encounter (Signed)
Recieved Death Certificate from ___[facility] _______ Delivered/Placed ________nurse box ____

## 2018-08-17 NOTE — Telephone Encounter (Signed)
Death certificate dropped off at wrong office. Funeral home contacted for pick up.

## 2018-08-26 DEATH — deceased

## 2018-10-01 ENCOUNTER — Ambulatory Visit: Payer: Medicare Other | Admitting: Podiatry

## 2018-10-19 ENCOUNTER — Ambulatory Visit: Payer: Medicare Other | Admitting: Neurology

## 2018-11-01 ENCOUNTER — Ambulatory Visit: Payer: Medicare Other | Admitting: Internal Medicine

## 2019-06-05 ENCOUNTER — Ambulatory Visit: Payer: Medicare Other | Admitting: Urology

## 2019-09-14 IMAGING — DX DG ABD PORTABLE 1V
1 series · 1 of 1 positions shown · non-contrast
Comparison: 12/16/2017 at [DATE] p.m...

CLINICAL DATA: NG tube placement.

EXAM:
PORTABLE ABDOMEN - 1 VIEW

[abdomen kub]
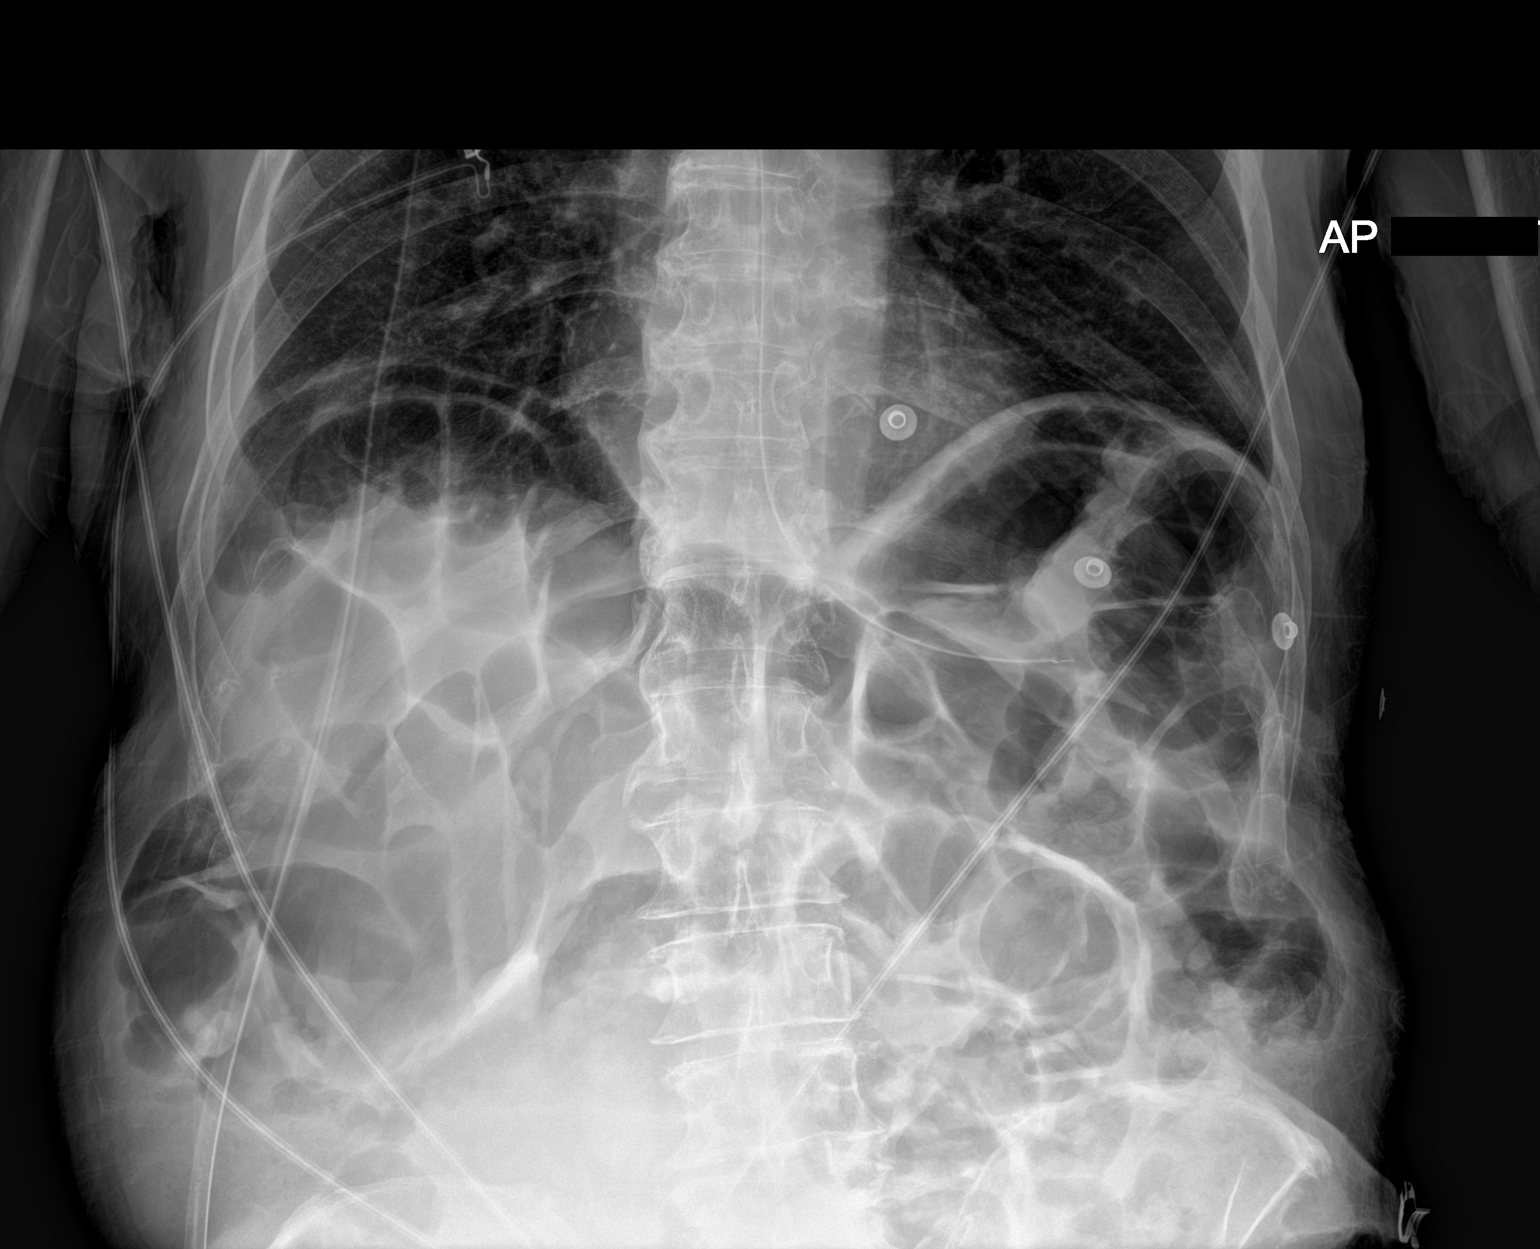

[1 of 1 positions shown; findings below may reference images not displayed]

FINDINGS: There is been interval placement of enteric tube which has tip over
the stomach in the left upper quadrant and side-port in the region
of the gastroesophageal junction. Lung bases unremarkable. No
significant change in multiple air-filled large and small bowel
loops. Remainder of the exam is unchanged.
IMPRESSION: No change in multiple air-filled large and small bowel loops likely
ileus.

Enteric tube with tip over the stomach in the left upper quadrant.

## 2019-09-14 IMAGING — DX DG ABDOMEN ACUTE W/ 1V CHEST
4 series · 4 of 4 positions shown · non-contrast
Comparison: CT of the abdomen and pelvis August 14, 2017 and
chest x-ray August 14, 2017

CLINICAL DATA: Abdominal pain.

EXAM:
DG ABDOMEN ACUTE W/ 1V CHEST

[chest pa]
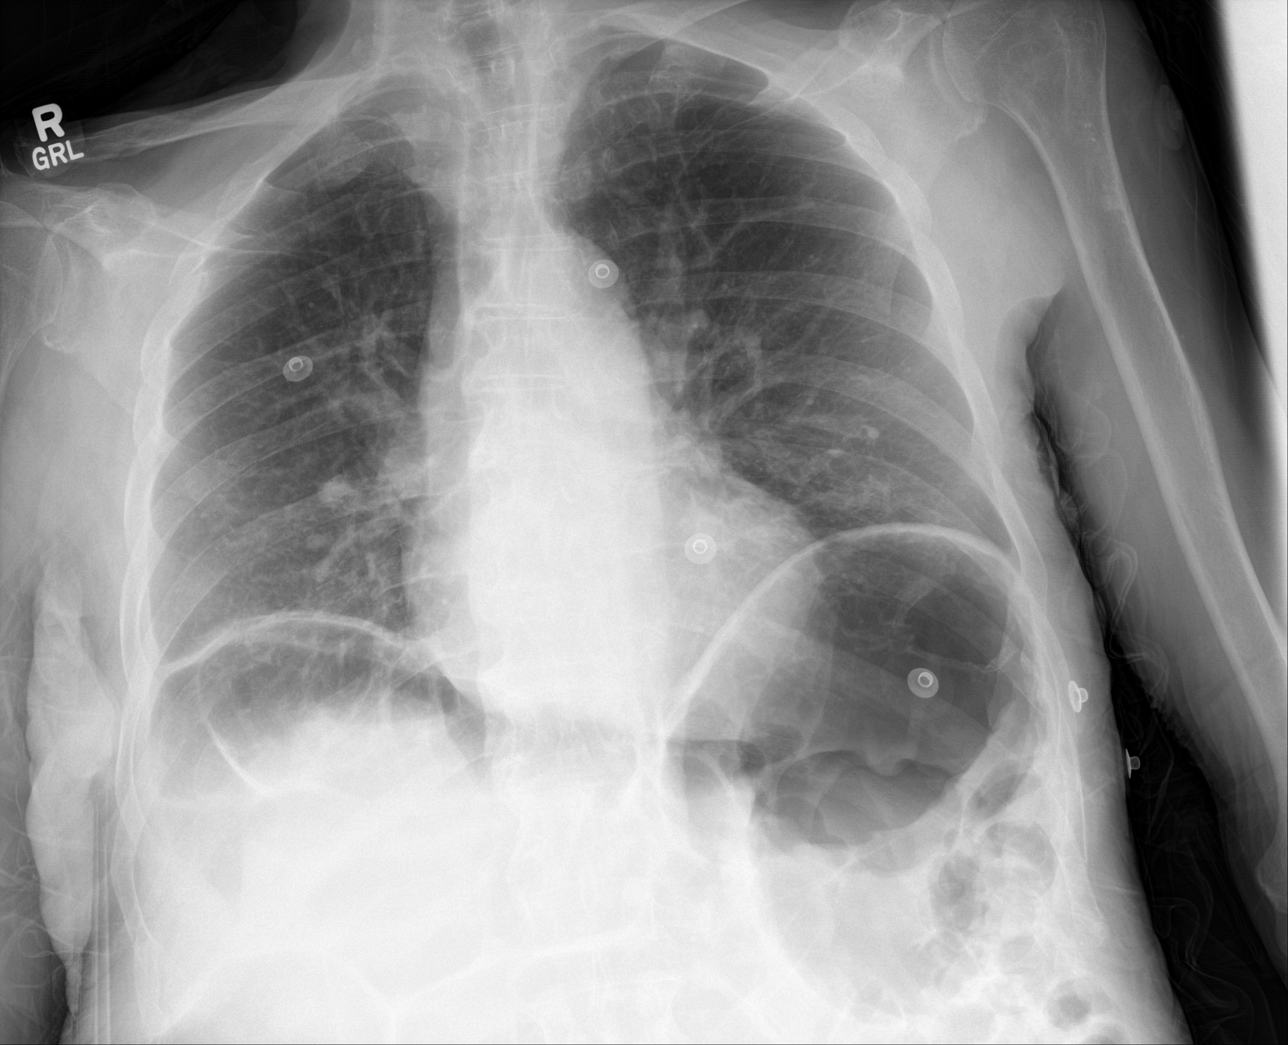

[abdomen erect]
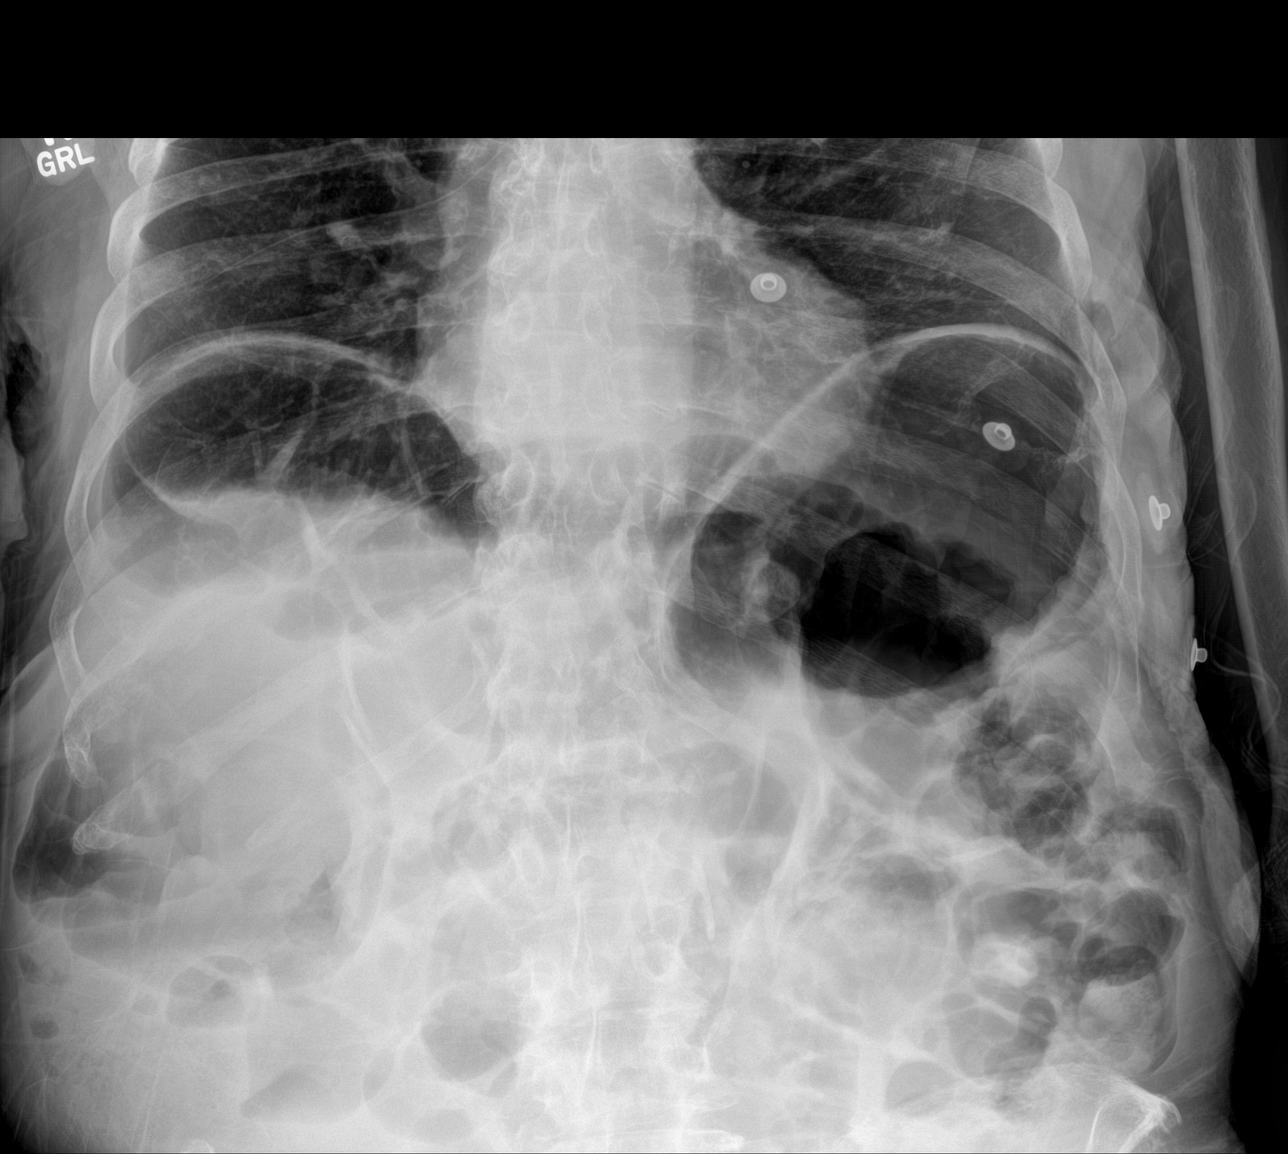

[abdomen supine (1 of 2)]
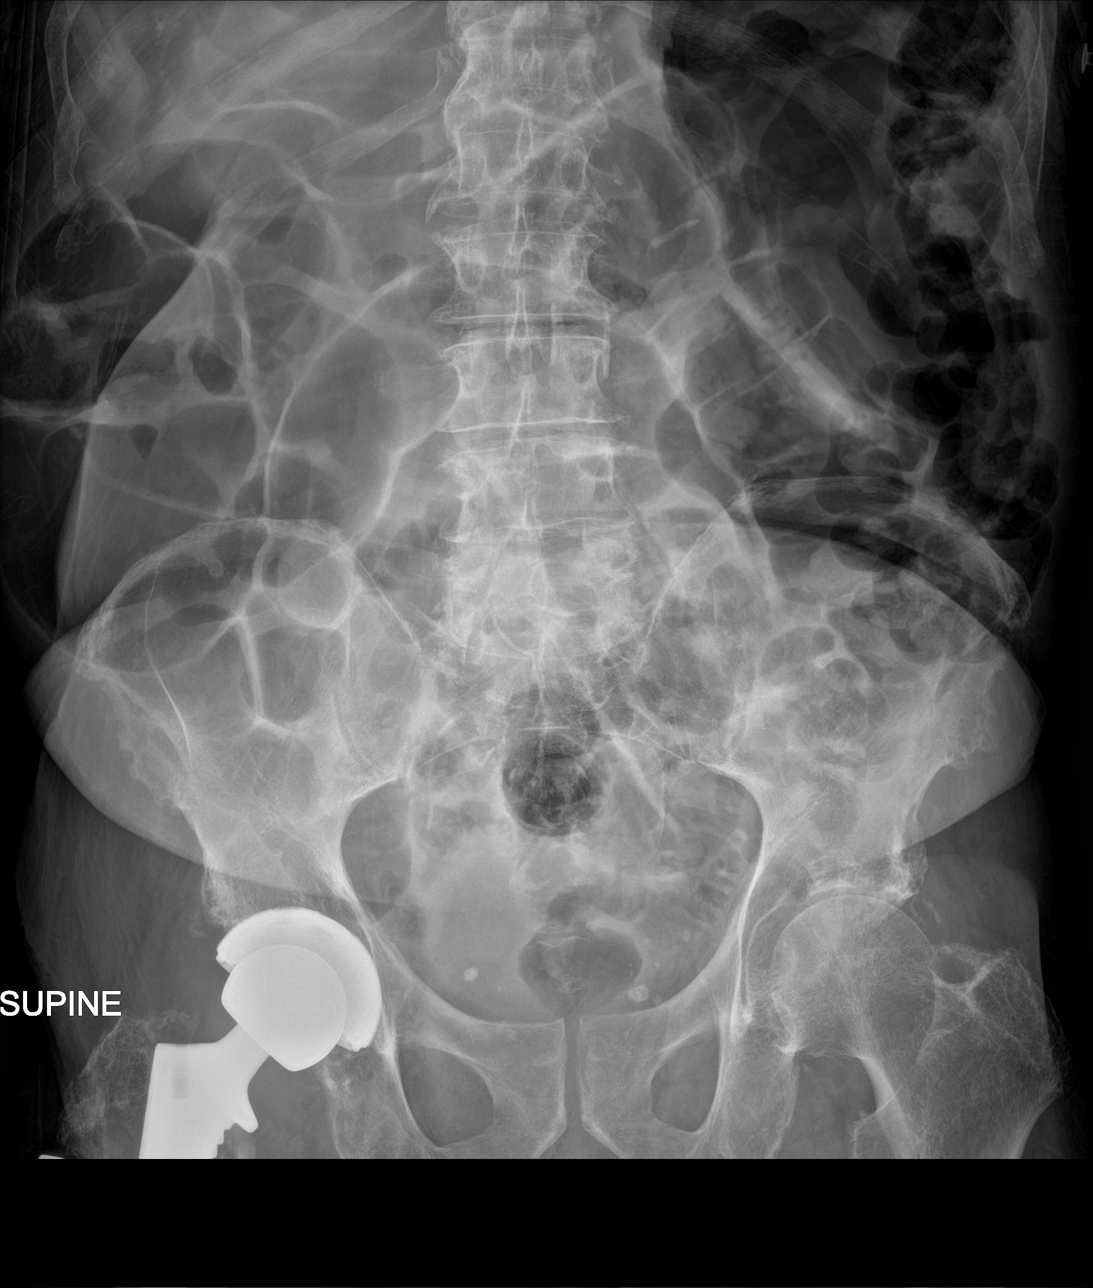

[abdomen supine (2 of 2)]
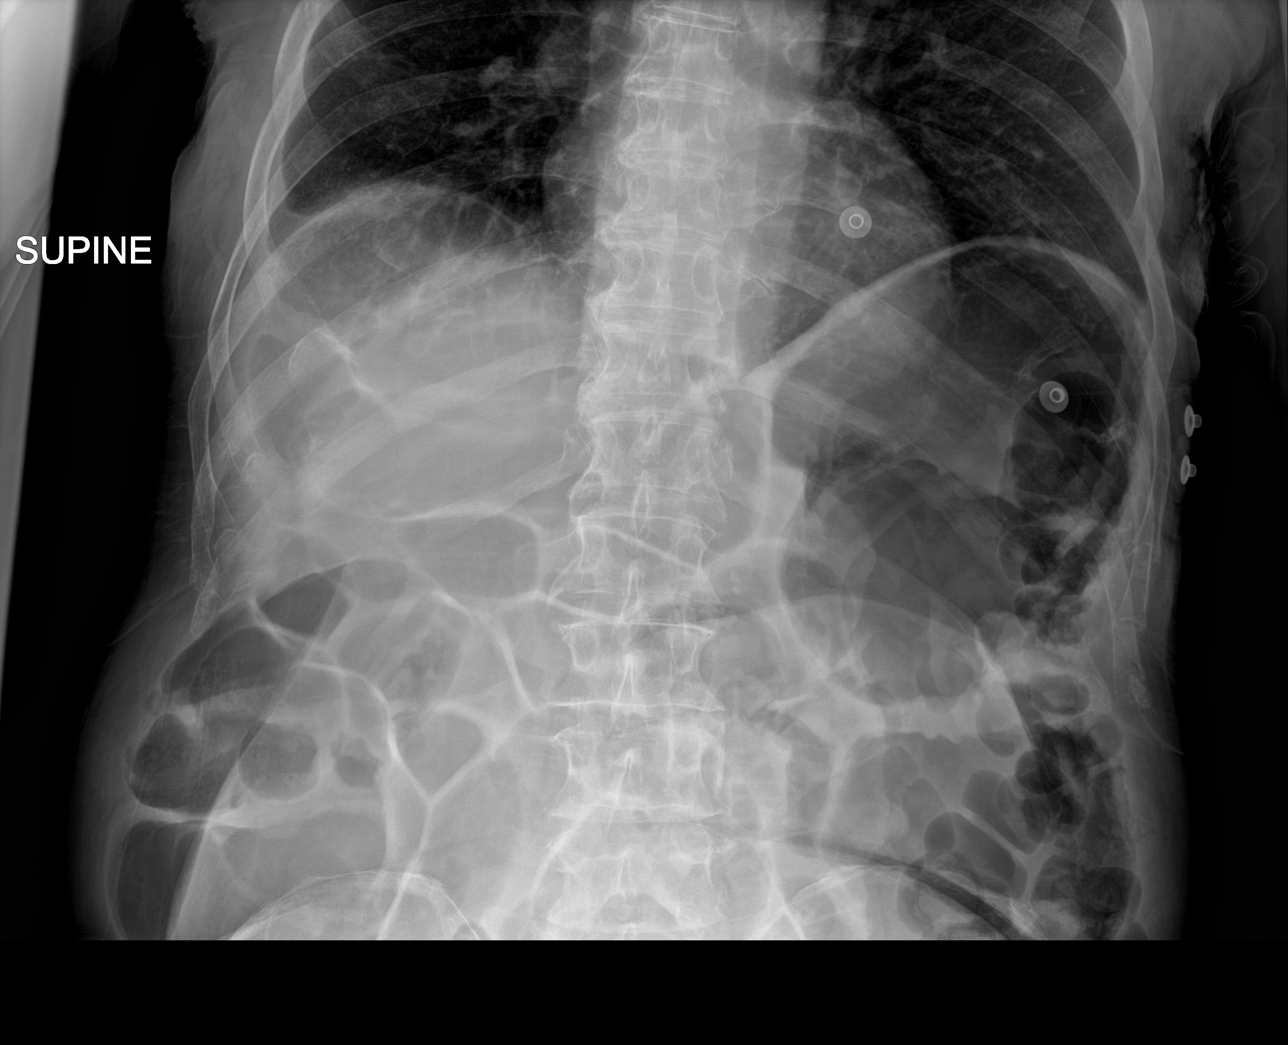

[4 of 4 positions shown; findings below may reference images not displayed]

FINDINGS: Air under the right left diaphragm is consistent with air in colon
and stomach. This is been seen on previous studies. No pneumothorax.
No pulmonary nodules or masses. No focal infiltrates. The
cardiomediastinal silhouette is normal.

No free air identified. Air-filled prominent loops of large and
small bowel suggest ileus rather than obstruction. No other acute
abnormalities identified.
IMPRESSION: Apparent ileus in the abdomen.  No other acute abnormalities.
